# Patient Record
Sex: Male | Born: 1945
Health system: Southern US, Community
[De-identification: ages and names within clinical notes are randomized; demographics above are authoritative.]

## PROBLEM LIST (undated history)

## (undated) DIAGNOSIS — C3491 Malignant neoplasm of unspecified part of right bronchus or lung: Secondary | ICD-10-CM

## (undated) DIAGNOSIS — K922 Gastrointestinal hemorrhage, unspecified: Secondary | ICD-10-CM

## (undated) DIAGNOSIS — K219 Gastro-esophageal reflux disease without esophagitis: Secondary | ICD-10-CM

## (undated) DIAGNOSIS — I482 Chronic atrial fibrillation, unspecified: Secondary | ICD-10-CM

## (undated) DIAGNOSIS — I2510 Atherosclerotic heart disease of native coronary artery without angina pectoris: Secondary | ICD-10-CM

## (undated) DIAGNOSIS — I10 Essential (primary) hypertension: Secondary | ICD-10-CM

## (undated) DIAGNOSIS — E119 Type 2 diabetes mellitus without complications: Secondary | ICD-10-CM

## (undated) DIAGNOSIS — R0600 Dyspnea, unspecified: Secondary | ICD-10-CM

## (undated) DIAGNOSIS — I509 Heart failure, unspecified: Secondary | ICD-10-CM

## (undated) DIAGNOSIS — Z720 Tobacco use: Secondary | ICD-10-CM

## (undated) DIAGNOSIS — Z95810 Presence of automatic (implantable) cardiac defibrillator: Secondary | ICD-10-CM

## (undated) DIAGNOSIS — E785 Hyperlipidemia, unspecified: Secondary | ICD-10-CM

## (undated) DIAGNOSIS — Z7901 Long term (current) use of anticoagulants: Secondary | ICD-10-CM

## (undated) DIAGNOSIS — D649 Anemia, unspecified: Secondary | ICD-10-CM

## (undated) DIAGNOSIS — R590 Localized enlarged lymph nodes: Secondary | ICD-10-CM

## (undated) DIAGNOSIS — Z955 Presence of coronary angioplasty implant and graft: Secondary | ICD-10-CM

## (undated) DIAGNOSIS — I252 Old myocardial infarction: Secondary | ICD-10-CM

## (undated) DIAGNOSIS — I739 Peripheral vascular disease, unspecified: Secondary | ICD-10-CM

## (undated) HISTORY — PX: CORONARY STENT PLACEMENT: SHX1402

## (undated) HISTORY — DX: Atherosclerotic heart disease of native coronary artery without angina pectoris: I25.10

## (undated) HISTORY — DX: Type 2 diabetes mellitus without complications: E11.9

## (undated) HISTORY — DX: Tobacco use: Z72.0

## (undated) HISTORY — DX: Hyperlipidemia, unspecified: E78.5

## (undated) HISTORY — PX: CARDIAC SURGERY: SHX584

## (undated) HISTORY — DX: Peripheral vascular disease, unspecified: I73.9

## (undated) HISTORY — DX: Gastrointestinal hemorrhage, unspecified: K92.2

---

## 2001-02-13 ENCOUNTER — Encounter: Payer: Self-pay | Admitting: Family Medicine

## 2001-02-13 ENCOUNTER — Ambulatory Visit (HOSPITAL_COMMUNITY): Admission: RE | Admit: 2001-02-13 | Discharge: 2001-02-13 | Payer: Self-pay | Admitting: Family Medicine

## 2001-12-12 ENCOUNTER — Encounter: Payer: Self-pay | Admitting: Family Medicine

## 2001-12-12 ENCOUNTER — Ambulatory Visit (HOSPITAL_COMMUNITY): Admission: RE | Admit: 2001-12-12 | Discharge: 2001-12-12 | Payer: Self-pay | Admitting: Family Medicine

## 2008-02-27 DIAGNOSIS — I214 Non-ST elevation (NSTEMI) myocardial infarction: Secondary | ICD-10-CM

## 2008-02-27 HISTORY — DX: Non-ST elevation (NSTEMI) myocardial infarction: I21.4

## 2008-05-31 ENCOUNTER — Inpatient Hospital Stay (HOSPITAL_COMMUNITY): Admission: EM | Admit: 2008-05-31 | Discharge: 2008-06-03 | Payer: Self-pay | Admitting: Emergency Medicine

## 2008-05-31 IMAGING — CR DG CHEST 1V PORT
1 series · 1 of 1 positions shown · non-contrast
Comparison: Report dated [DATE].

CLINICAL DATA: Abnormal EKG.  Smoker.  Hypertension.

PORTABLE CHEST - 1 VIEW

[view not recorded]
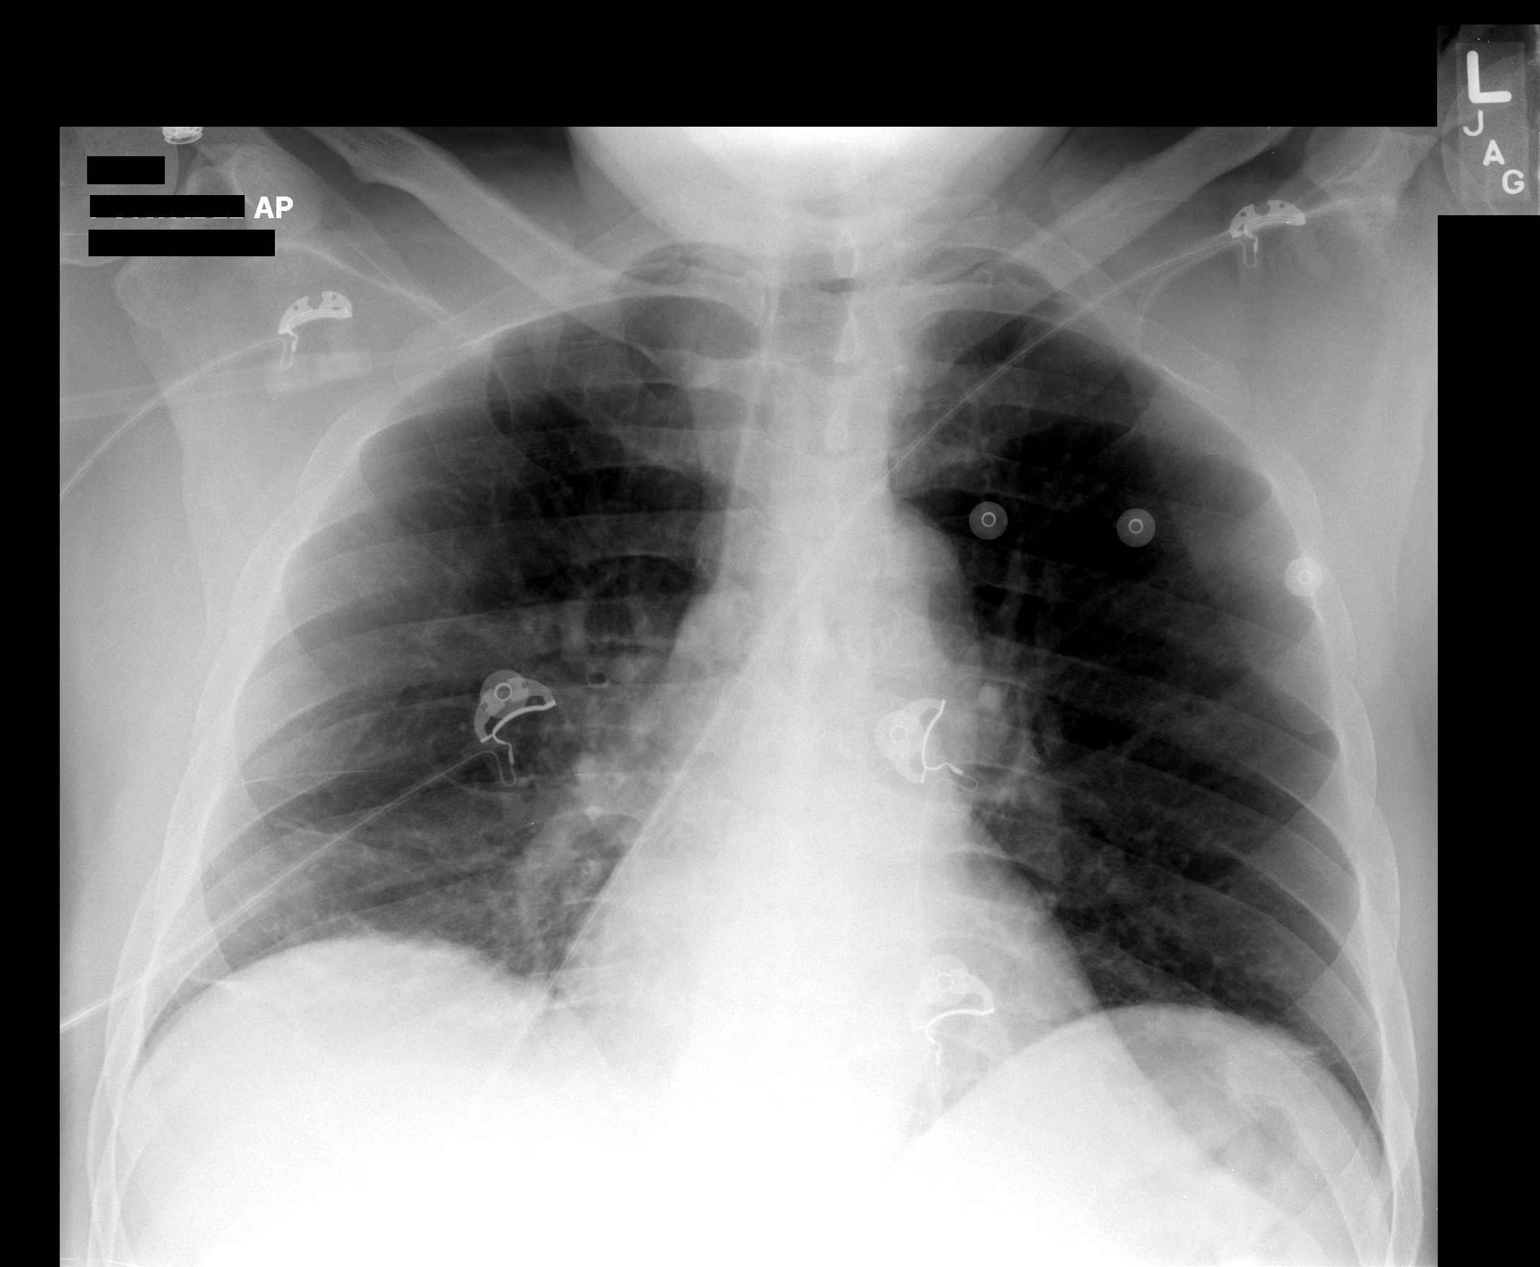

[1 of 1 positions shown; findings below may reference images not displayed]

FINDINGS: Normal sized heart.  Mild amount of atelectasis or
scarring at both lung bases.  Diffuse peribronchial thickening.
Normal vascularity.  Unremarkable bones.
IMPRESSION: 1.  Mild bibasilar atelectasis or scarring.
2.  Mild to moderate bronchitic changes.

## 2010-06-07 LAB — POCT I-STAT, CHEM 8
BUN: 5 mg/dL — ABNORMAL LOW (ref 6–23)
Calcium, Ion: 1.15 mmol/L (ref 1.12–1.32)
Chloride: 100 mEq/L (ref 96–112)
Creatinine, Ser: 1.1 mg/dL (ref 0.4–1.5)
Glucose, Bld: 123 mg/dL — ABNORMAL HIGH (ref 70–99)
HCT: 48 % (ref 39.0–52.0)
Hemoglobin: 16.3 g/dL (ref 13.0–17.0)
Potassium: 3.2 mEq/L — ABNORMAL LOW (ref 3.5–5.1)
Sodium: 141 mEq/L (ref 135–145)
TCO2: 32 mmol/L (ref 0–100)

## 2010-06-07 LAB — HEPATIC FUNCTION PANEL
ALT: 31 U/L (ref 0–53)
AST: 23 U/L (ref 0–37)
Albumin: 3.3 g/dL — ABNORMAL LOW (ref 3.5–5.2)
Alkaline Phosphatase: 58 U/L (ref 39–117)
Bilirubin, Direct: <0.1 mg/dL (ref 0.0–0.3)
Total Bilirubin: 0.5 mg/dL (ref 0.3–1.2)
Total Protein: 6.4 g/dL (ref 6.0–8.3)

## 2010-06-07 LAB — BASIC METABOLIC PANEL
BUN: 5 mg/dL — ABNORMAL LOW (ref 6–23)
BUN: 6 mg/dL (ref 6–23)
BUN: 7 mg/dL (ref 6–23)
BUN: 8 mg/dL (ref 6–23)
BUN: 9 mg/dL (ref 6–23)
CO2: 26 mEq/L (ref 19–32)
CO2: 27 mEq/L (ref 19–32)
CO2: 29 mEq/L (ref 19–32)
CO2: 30 mEq/L (ref 19–32)
CO2: 31 mEq/L (ref 19–32)
Calcium: 8.8 mg/dL (ref 8.4–10.5)
Calcium: 8.8 mg/dL (ref 8.4–10.5)
Calcium: 9 mg/dL (ref 8.4–10.5)
Calcium: 9.1 mg/dL (ref 8.4–10.5)
Calcium: 9.4 mg/dL (ref 8.4–10.5)
Chloride: 100 mEq/L (ref 96–112)
Chloride: 104 mEq/L (ref 96–112)
Chloride: 104 mEq/L (ref 96–112)
Chloride: 104 mEq/L (ref 96–112)
Chloride: 106 mEq/L (ref 96–112)
Creatinine, Ser: 0.92 mg/dL (ref 0.4–1.5)
Creatinine, Ser: 0.95 mg/dL (ref 0.4–1.5)
Creatinine, Ser: 0.96 mg/dL (ref 0.4–1.5)
Creatinine, Ser: 0.99 mg/dL (ref 0.4–1.5)
Creatinine, Ser: 1.07 mg/dL (ref 0.4–1.5)
GFR calc Af Amer: >60 mL/min (ref >60)
GFR calc Af Amer: >60 mL/min (ref >60)
GFR calc Af Amer: >60 mL/min (ref >60)
GFR calc Af Amer: >60 mL/min (ref >60)
GFR calc Af Amer: >60 mL/min (ref >60)
GFR calc non Af Amer: >60 mL/min (ref >60)
GFR calc non Af Amer: >60 mL/min (ref >60)
GFR calc non Af Amer: >60 mL/min (ref >60)
GFR calc non Af Amer: >60 mL/min (ref >60)
GFR calc non Af Amer: >60 mL/min (ref >60)
Glucose, Bld: 100 mg/dL — ABNORMAL HIGH (ref 70–99)
Glucose, Bld: 102 mg/dL — ABNORMAL HIGH (ref 70–99)
Glucose, Bld: 114 mg/dL — ABNORMAL HIGH (ref 70–99)
Glucose, Bld: 124 mg/dL — ABNORMAL HIGH (ref 70–99)
Glucose, Bld: 94 mg/dL (ref 70–99)
Potassium: 3 mEq/L — ABNORMAL LOW (ref 3.5–5.1)
Potassium: 3.2 mEq/L — ABNORMAL LOW (ref 3.5–5.1)
Potassium: 3.5 mEq/L (ref 3.5–5.1)
Potassium: 3.5 mEq/L (ref 3.5–5.1)
Potassium: 3.7 mEq/L (ref 3.5–5.1)
Sodium: 138 mEq/L (ref 135–145)
Sodium: 139 mEq/L (ref 135–145)
Sodium: 139 mEq/L (ref 135–145)
Sodium: 140 mEq/L (ref 135–145)
Sodium: 141 mEq/L (ref 135–145)

## 2010-06-07 LAB — CARDIAC PANEL(CRET KIN+CKTOT+MB+TROPI)
CK, MB: 1 ng/mL (ref 0.3–4.0)
CK, MB: 3.4 ng/mL (ref 0.3–4.0)
CK, MB: 4.2 ng/mL — ABNORMAL HIGH (ref 0.3–4.0)
CK, MB: 4.4 ng/mL — ABNORMAL HIGH (ref 0.3–4.0)
Relative Index: 2.4 (ref 0.0–2.5)
Relative Index: 2.7 — ABNORMAL HIGH (ref 0.0–2.5)
Relative Index: 3.2 — ABNORMAL HIGH (ref 0.0–2.5)
Relative Index: INVALID (ref 0.0–2.5)
Total CK: 128 U/L (ref 7–232)
Total CK: 131 U/L (ref 7–232)
Total CK: 184 U/L (ref 7–232)
Total CK: 53 U/L (ref 7–232)
Troponin I: 0.02 ng/mL (ref 0.00–0.06)
Troponin I: 0.63 ng/mL (ref 0.00–0.06)
Troponin I: 0.79 ng/mL (ref 0.00–0.06)
Troponin I: 0.87 ng/mL (ref 0.00–0.06)

## 2010-06-07 LAB — LIPID PANEL
Cholesterol: 208 mg/dL — ABNORMAL HIGH (ref 0–200)
HDL: 28 mg/dL — ABNORMAL LOW (ref >39)
LDL Cholesterol: 153 mg/dL — ABNORMAL HIGH (ref 0–99)
Total CHOL/HDL Ratio: 7.4 RATIO
Triglycerides: 134 mg/dL (ref <150)
VLDL: 27 mg/dL (ref 0–40)

## 2010-06-07 LAB — CBC
HCT: 39.8 % (ref 39.0–52.0)
HCT: 45.3 % (ref 39.0–52.0)
Hemoglobin: 13.5 g/dL (ref 13.0–17.0)
Hemoglobin: 15.5 g/dL (ref 13.0–17.0)
MCHC: 33.9 g/dL (ref 30.0–36.0)
MCHC: 34.2 g/dL (ref 30.0–36.0)
MCV: 90.8 fL (ref 78.0–100.0)
MCV: 90.9 fL (ref 78.0–100.0)
Platelets: 160 10*3/uL (ref 150–400)
Platelets: 190 10*3/uL (ref 150–400)
RBC: 4.39 MIL/uL (ref 4.22–5.81)
RBC: 4.98 MIL/uL (ref 4.22–5.81)
RDW: 13.5 % (ref 11.5–15.5)
RDW: 13.5 % (ref 11.5–15.5)
WBC: 5.8 10*3/uL (ref 4.0–10.5)
WBC: 7.6 10*3/uL (ref 4.0–10.5)

## 2010-06-07 LAB — PROTIME-INR
INR: 1.1 (ref 0.00–1.49)
Prothrombin Time: 14.8 seconds (ref 11.6–15.2)

## 2010-06-07 LAB — CK TOTAL AND CKMB (NOT AT ARMC)
CK, MB: 6.2 ng/mL — ABNORMAL HIGH (ref 0.3–4.0)
Relative Index: 2.9 — ABNORMAL HIGH (ref 0.0–2.5)
Total CK: 212 U/L (ref 7–232)

## 2010-06-07 LAB — HEPARIN LEVEL (UNFRACTIONATED): Heparin Unfractionated: 0.56 IU/mL (ref 0.30–0.70)

## 2010-06-07 LAB — TROPONIN I: Troponin I: 0.75 ng/mL (ref 0.00–0.06)

## 2010-07-11 NOTE — Cardiovascular Report (Signed)
NAME:  Peter Lambert NO.:  [id number]   MEDICAL RECORD NO.:  [id number]           PATIENT TYPE:   LOCATION:                                 FACILITY:   PHYSICIAN:  Quay Burow, M.D.   DATE OF BIRTH:  05/02/1945   DATE OF PROCEDURE:  DATE OF DISCHARGE:                            CARDIAC CATHETERIZATION   Peter Lambert is a 66 year old, mildly overweight, divorced African  American male with positive risk factors who had an episode of chest  pain, which he thought was some indigestion approximately 5 days ago.  He was seen in his family [facility] office on Monday and was  transported to [facility] because of EKG changes for further evaluation.  His  troponin came back positive at 0.87.  He was placed on IV heparin and  had no continue chest pain.   PROCEDURE DESCRIPTION:  The patient was brought to the Second Floor  [facility] in the postabsorptive state.  Premedicated  with p.o. Valium, IV Versed and fentanyl.  His right groin was prepped  and shaved in the usual sterile fashion.  A 1% Xylocaine was used for  local anesthesia.  A 6-French sheath was inserted into the right femoral  artery using standard Seldinger technique.  It should be noted that this  was inserted under direct visualization with a high stick because of  the patient's prior aortobifemoral bypass graft.  A 6-French right and  left Judkins diagnostic catheters, as well as with a 6-French pigtail  catheter, were used for selective coronary angiography, left  ventriculography, subselective left internal mammary artery angiography,  and distal abdominal aortography.  Visipaque dye was used for the  entirety of the case.  Retrograde aortic, left ventricular, and pullback  pressures were recorded.   HEMODYNAMICS:  1. Aortic systolic pressure [id number], diastolic pressure 74.  2. Left ventricular systolic pressure 0000000, end-diastolic pressure 17.   Selective coronary angiography:  1. Left  main normal.  2. LAD; LAD had a high-grade hypodense proximal bifurcation stenosis      involving the ostium of the first diagonal branch, which was medium      in size.  The LAD itself was 90% and the ostium of the diagonal was      approximately 80%.  3. Left circumflex; this was a dominant vessel with 80% stenosis in      the mid A-V groove circumflex just before takeoff of distal      posterolateral branch.  4. Right coronary; nondominant and occluded with bridging collaterals      in its midportion.  5. Left ventriculography; RAO left ventriculogram was performed using      25 mL of Visipaque dye at 12 mL per second.  The overall LVEF was      estimated at approximately 50-55% with mild global hypokinesia.  6. Left internal mammary artery; this vessel was subselectively      visualized and was widely patent.  It is suitable for using during      the coronary artery bypass grafting if necessary.  7. Distal abdominal aortography;  distal abdominal aortogram was      performed using 20 mL of Visipaque dye at 20 mL per second.  Renal      arteries were widely patent.  The infrarenal abdominal aorta      including the aortobifemoral bypass graft was widely patent.   IMPRESSION:  [name] has high-grade left anterior descending diagonal  branch disease, which probably represents his culprit vessel, also has  moderately high-grade mid atrioventricular groove circumflex and  dominant vessel.  We will proceed with left anterior descending and  diagonal branch intervention with potential staged intervention in the  future.   Existing 6-French sheath in the right femoral artery was exchanged over  wire for a 7-French sheath.  The patient had received 4 baby aspirin  this morning prior to the procedure, as well as 40 mEq of p.o. K-Dur for  a potassium of 3.2.  He received Plavix 600 mg p.o. and Pepcid 20 mg IV,  as well as Angiomax bolus with an ACT of 340.   Using a 7-French XB 3.5 guide  catheter along with two 0.014 x 190 [employer]  soft wires and a 2.0 x 10 along with a 2.5 x 15 angioscope atherectomy  catheter.  Both diagonal and LAD cutting balloon atherectomies were  performed using double wire technique.  A 2.0 was placed in the ostium  of the diagonal and 2.5 in the LAD proper.  A 200 mcg of intracoronary  nitroglycerin was then administered.  A 2.75 x 18 XIENCE V Abbott drug-  eluting stent was then carefully positioned across the diagonal branch  wire and encompassing the LAD disease, and this was deployed at 12  atmospheres.  The diagonal branch wire was then withdrawn, and  postdilatation was performed with a 3.0 x 15 [place] Voyager at 16  atmospheres (approximately 3.1 mL) resulting in reduction of 80% ostial  diagonal to less than 50% and a 90% proximal LAD to 0% residual without  dissection.  There were no hemodynamic and electrocardiographic  sequelae.  The guide wire catheters were removed.  The sheath was sewn  securely in place.  The patient left the lab in stable condition.  The  Angiomax was discontinued.  Plans will be to remove the sheath in  approximate 2 hours.  The patient will be gently hydrated, continue  aspirin and Plavix, most likely discharged in the next 48 hours.  He  left the lab in stable condition.      Quay Burow, M.D.  Electronically Signed     JB/MEDQ  D:  06/01/2008  T:  06/02/2008  Job:  [id number]   cc:   Second Floor [facility]  [facility]  Sherrilee Gilles. Gerarda Fraction, MD

## 2010-07-11 NOTE — H&P (Signed)
NAME:  Peter Lambert, DACRES NO.:  [id number]   MEDICAL RECORD NO.:  [id number]          PATIENT TYPE:  OBV   LOCATION:  3703                         FACILITY:  [facility]   PHYSICIAN:  Quay Burow, M.D.   DATE OF BIRTH:  11-30-1945   DATE OF ADMISSION:  05/31/2008  DATE OF DISCHARGE:                              HISTORY & PHYSICAL   [name] is a 65 year old mildly overweight divorced African American  male father of three children who works part-time at [employer].  He was  referred by Dr. Gerarda Fraction for evaluation of unstable angina.   [name] past medical history is remarkable for having undergone  peripheral vascular surgical procedure by Dr. Shanon Rosser back in 1997,  which sounds like an aorta left femoral bypass graft.  His  cardiovascular receptor profile is positive for continued tobacco abuse  of nearly pack a day for over 50 years and hypertension.  He is not  hyperlipidemic or diabetic.  He has no family history.  He has never had  heart attack or stroke.  He has had reflux in the past, treated with  Prevacid several years ago.  Last Thursday, he developed an episode of  substernal chest pain, which he thought was indigestion that lasted  approximately 10-15 minutes.  That discomfort has not recurred.  He saw  Dr. Gerarda Fraction today who after hearing the story and evaluating his EKG,  transported him to [facility] for further evaluation.   MEDICATIONS:  Hydrochlorothiazide and Adalat.  There is a home  medication.   ALLERGIES:  None.   SOCIAL HISTORY:  The patient is divorced and works part-time at the [employer].   PHYSICAL EXAMINATION:  VITAL SIGNS:  Blood pressure is 133/62 with pulse  of 89.  Sats are 99%.  The patient is alert, oriented.  NEUROLOGIC:  Intact.  LUNGS:  Clear.  NECK:  Carotids are 2+ without bruits.  HEART:  Regular rate and rhythm without murmurs, gallops, rubs, or  clicks.  ABDOMEN:  Soft, nontender.  Normoactive bowel sounds.  No  hepatosplenomegaly, organomegaly, audible bruits.  He does have midline  scar beginning at his epigastrium extending down below his umbilicus.  Femoral arteries at 2+ with a soft high-pitched right femoral bruit and  a louder lower pitched left femoral bruit.  He has diminished pedal  pulses without edema.   DICTATION ENDS HERE.     Quay Burow, M.D.  Electronically Signed    JB/MEDQ  D:  05/31/2008  T:  06/01/2008  Job:  [id number]   cc:   Sherrilee Gilles. Gerarda Fraction, MD

## 2010-07-11 NOTE — H&P (Signed)
NAME:  Peter Lambert, Peter Lambert NO.:  [id number]   MEDICAL RECORD NO.:  [id number]          PATIENT TYPE:  OBV   LOCATION:  3703                         FACILITY:  [facility]   PHYSICIAN:  Quay Burow, M.D.   DATE OF BIRTH:  1945-04-22   DATE OF ADMISSION:  05/31/2008  DATE OF DISCHARGE:                              HISTORY & PHYSICAL   ADDENDUM    A 12-lead EKG reveals sinus rhythm at 89 with inferior and anterolateral  T-wave inversion.   IMPRESSION:  Mr. Stifel has a positive risk factors, history of surgical  procedure in the past, and chest pain, which sounds like unstable angina  with EKG changes.  We will admit him to telemetry and put him on IV  heparin.  A portable x-ray was performed, and labs were drawn and still  pending.  Plans will be to perform diagnostic coronary arteriography on  him in the morning.  I explained the risks and benefits and the patient  agrees.      Quay Burow, M.D.  Electronically Signed     JB/MEDQ  D:  05/31/2008  T:  06/01/2008  Job:  [id number]

## 2010-07-14 NOTE — Discharge Summary (Signed)
NAME:  Peter Lambert, Peter Lambert NO.:  [id number]   MEDICAL RECORD NO.:  [id number]          PATIENT TYPE:  INP   LOCATION:  2033                         FACILITY:  [facility]   PHYSICIAN:  Quay Burow, M.D.   DATE OF BIRTH:  September 08, 1945   DATE OF ADMISSION:  05/31/2008  DATE OF DISCHARGE:  06/03/2008                               DISCHARGE SUMMARY   DISCHARGE DIAGNOSES:  1. Non-ST elevation myocardial infarction.  2. Coronary artery disease status post cardiac catheterization on      June 01, 2008, with left anterior descending stent, Xience 5, 2.75      x 18 placed and a percutaneous transluminal coronary angioplasty of      his diagonal vessel on June 01, 2008, by Dr. Quay Burow.      Residual disease of 80% in a circumflex at the bifurcation.  He had      a dominant circumflex and 100% in a mid right coronary artery.  3. Low normal ejection fraction of 50-55%.  4. History of peripheral vascular disease with an aorto-bi-iliac (I      believe) in the past by Dr. Scot Dock in 1997.  5. Hyperlipidemia.  6. Hypertension.   LABORATORY DATA:  Hemoglobin on admission 15.5, discharged 13.5,  hematocrit 39.8, WBC 7.6, and platelets 160.  Sodium 140, potassium 3.5,  chloride 106, CO2 of 26, BUN 9, creatinine 0.92, glucose 102.  AST was  23, ALT was 31.  CK-MB #1 is 212/6.2, troponin of 0.75; #2 is 184/4.4,  troponin of 0.87; #3 is 53/1.0, troponin of 0.02; #4 is 131/4.2,  troponin of 0.79; #5 is 128/3.4, troponin of 0.63.  Lipid profile total  cholesterol is 208, triglycerides 134, HDL 28, LDL was 153.   DISCHARGE INSTRUCTIONS:  He will go to Dr. Kennon Holter office for a Leane Call on June 10, 2008, at 12:45.  He will follow up with Dr. Gwenlyn Found  on June 17, 2008, at 9:30.  His chest x-ray showed some bronchitic  changes.   DISCHARGE MEDICATIONS:  1. Hydrochlorothiazide 25 mg a half every day.  2. Aspirin 325 mg every day.  3. Metoprolol 25 mg twice a day.  4. Plavix 75  mg every day.  5. K-Dur 20 mEq every day.  6. Lisinopril 10 mg every day.  7. Nitroglycerin 150 under tongue if needed every 5 minutes x3 for      chest pain.  8. Simvastatin 40 mg at bedtime.   HOSPITAL COURSE:  Peter Lambert is a 65 year old overweight African  American male patient of Dr. Gerarda Fraction, who was referred to Dr. Quay Burow because of chest pain.  He was seen in [place] and was sent to  [facility] for admission because of chest pain.  He had  inferior and anterolateral T-wave inversion.  He underwent cardiac  catheterization the following day on June 01, 2008, and he was found to  have multivessel disease.  He underwent intervention to a 90% LAD with a  Xience 5 stent 2.75 x 18.  He also had a diagonal lesion at the  bifurcation  of the stenosis which was reduced by PTCA from 80 to less  than 40%.  He had residual disease 80 and 60% in his circumflex and 100%  in his RCA, which was a small vessel.  He was circumflex dominant.  His  enzymes  did bump up mildly.  He was seen by cardiac rehab by the tobacco smoking  cessation nurse.  He was referred to cardiac rehab phase II.  The plan  was for him to have a stress test to see if he is having any ischemia  from his residual disease, and he will then follow up with Dr. Gwenlyn Found for  further recommendations.      Cyndia Bent, N.P.      Quay Burow, M.D.  Electronically Signed    BB/MEDQ  D:  06/30/2008  T:  07/01/2008  Job:  [id number]   cc:   Sherrilee Gilles. Gerarda Fraction, MD

## 2010-09-09 ENCOUNTER — Encounter: Payer: Self-pay | Admitting: Emergency Medicine

## 2010-09-09 ENCOUNTER — Other Ambulatory Visit: Payer: Self-pay

## 2010-09-09 ENCOUNTER — Emergency Department (HOSPITAL_COMMUNITY): Payer: Medicare Other

## 2010-09-09 ENCOUNTER — Emergency Department (HOSPITAL_COMMUNITY)
Admission: EM | Admit: 2010-09-09 | Discharge: 2010-09-09 | Disposition: A | Discharge status: Home / Self Care | Payer: Medicare Other | Attending: Emergency Medicine | Admitting: Emergency Medicine

## 2010-09-09 DIAGNOSIS — T5491XA Toxic effect of unspecified corrosive substance, accidental (unintentional), initial encounter: Secondary | ICD-10-CM | POA: Insufficient documentation

## 2010-09-09 DIAGNOSIS — T5991XA Toxic effect of unspecified gases, fumes and vapors, accidental (unintentional), initial encounter: Secondary | ICD-10-CM

## 2010-09-09 DIAGNOSIS — R61 Generalized hyperhidrosis: Secondary | ICD-10-CM

## 2010-09-09 DIAGNOSIS — F17200 Nicotine dependence, unspecified, uncomplicated: Secondary | ICD-10-CM | POA: Insufficient documentation

## 2010-09-09 HISTORY — DX: Old myocardial infarction: I25.2

## 2010-09-09 HISTORY — DX: Essential (primary) hypertension: I10

## 2010-09-09 LAB — COMPREHENSIVE METABOLIC PANEL
ALT: 18 U/L (ref 0–53)
AST: 20 U/L (ref 0–37)
Albumin: 4 g/dL (ref 3.5–5.2)
Alkaline Phosphatase: 65 U/L (ref 39–117)
BUN: 13 mg/dL (ref 6–23)
CO2: 31 mEq/L (ref 19–32)
Calcium: 9.5 mg/dL (ref 8.4–10.5)
Chloride: 98 mEq/L (ref 96–112)
Creatinine, Ser: 1.13 mg/dL (ref 0.50–1.35)
GFR calc Af Amer: >60 mL/min (ref >60)
GFR calc non Af Amer: >60 mL/min (ref >60)
Glucose, Bld: 98 mg/dL (ref 70–99)
Potassium: 3.3 mEq/L — ABNORMAL LOW (ref 3.5–5.1)
Sodium: 136 mEq/L (ref 135–145)
Total Bilirubin: 0.2 mg/dL — ABNORMAL LOW (ref 0.3–1.2)
Total Protein: 8.2 g/dL (ref 6.0–8.3)

## 2010-09-09 LAB — PROTIME-INR
INR: 1.14 (ref 0.00–1.49)
Prothrombin Time: 14.8 seconds (ref 11.6–15.2)

## 2010-09-09 LAB — DIFFERENTIAL
Basophils Absolute: 0.1 10*3/uL (ref 0.0–0.1)
Basophils Relative: 1 % (ref 0–1)
Eosinophils Absolute: 0.3 10*3/uL (ref 0.0–0.7)
Eosinophils Relative: 3 % (ref 0–5)
Lymphocytes Relative: 27 % (ref 12–46)
Lymphs Abs: 2.6 10*3/uL (ref 0.7–4.0)
Monocytes Absolute: 1.2 10*3/uL — ABNORMAL HIGH (ref 0.1–1.0)
Monocytes Relative: 12 % (ref 3–12)
Neutro Abs: 5.6 10*3/uL (ref 1.7–7.7)
Neutrophils Relative %: 57 % (ref 43–77)

## 2010-09-09 LAB — CBC
HCT: 42 % (ref 39.0–52.0)
Hemoglobin: 14.1 g/dL (ref 13.0–17.0)
MCH: 30 pg (ref 26.0–34.0)
MCHC: 33.6 g/dL (ref 30.0–36.0)
MCV: 89.4 fL (ref 78.0–100.0)
Platelets: 190 10*3/uL (ref 150–400)
RBC: 4.7 MIL/uL (ref 4.22–5.81)
RDW: 13.8 % (ref 11.5–15.5)
WBC: 9.8 10*3/uL (ref 4.0–10.5)

## 2010-09-09 LAB — CARDIAC PANEL(CRET KIN+CKTOT+MB+TROPI)
CK, MB: 5.2 ng/mL — ABNORMAL HIGH (ref 0.3–4.0)
CK, MB: 5.1 ng/mL — ABNORMAL HIGH (ref 0.3–4.0)
Relative Index: 2.1 (ref 0.0–2.5)
Relative Index: 2.4 (ref 0.0–2.5)
Total CK: 250 U/L — ABNORMAL HIGH (ref 7–232)
Total CK: 216 U/L (ref 7–232)
Troponin I: <0.3 ng/mL (ref <0.30)
Troponin I: <0.3 ng/mL (ref <0.30)

## 2010-09-09 LAB — APTT: aPTT: 28 seconds (ref 24–37)

## 2010-09-09 IMAGING — CR DG CHEST 2V
2 series · 2 of 2 positions shown · non-contrast
Comparison: [DATE]

CLINICAL DATA: diaphoresis, history of cardiac stents

CHEST - 2 VIEW

[view not recorded (1 of 2)]
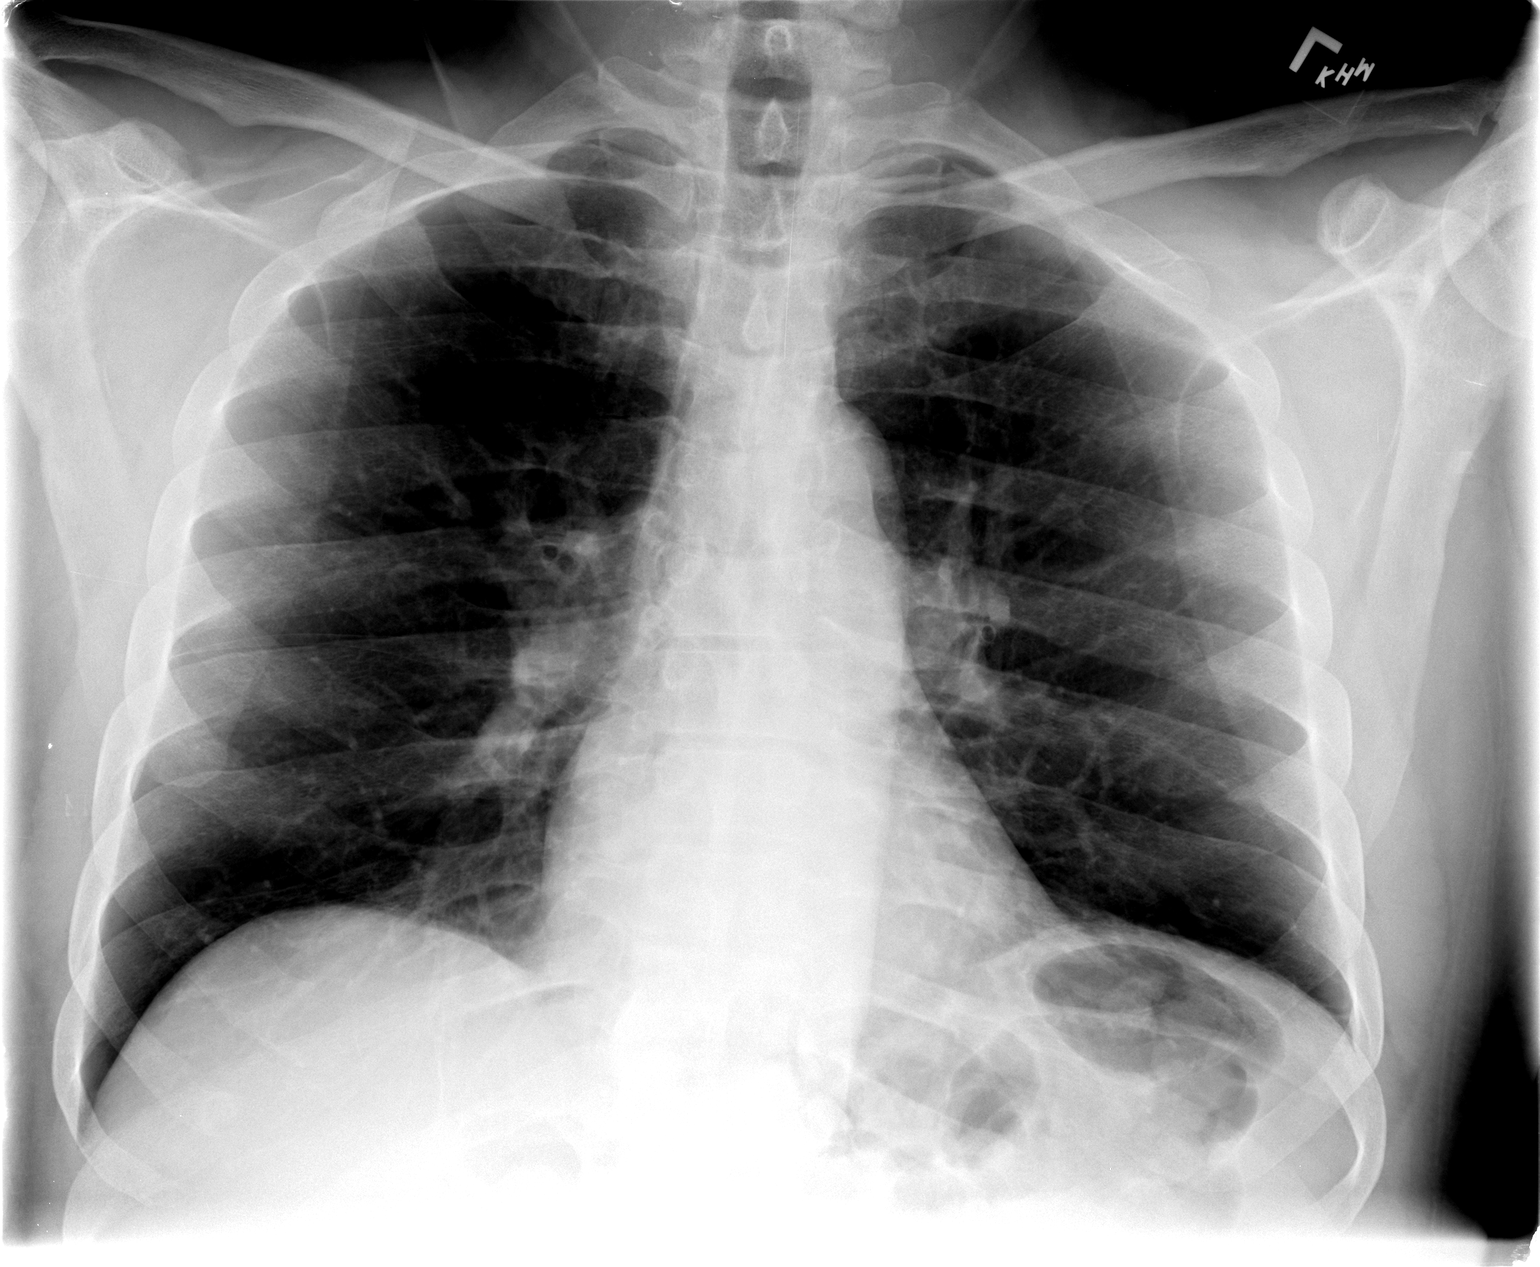

[view not recorded (2 of 2)]
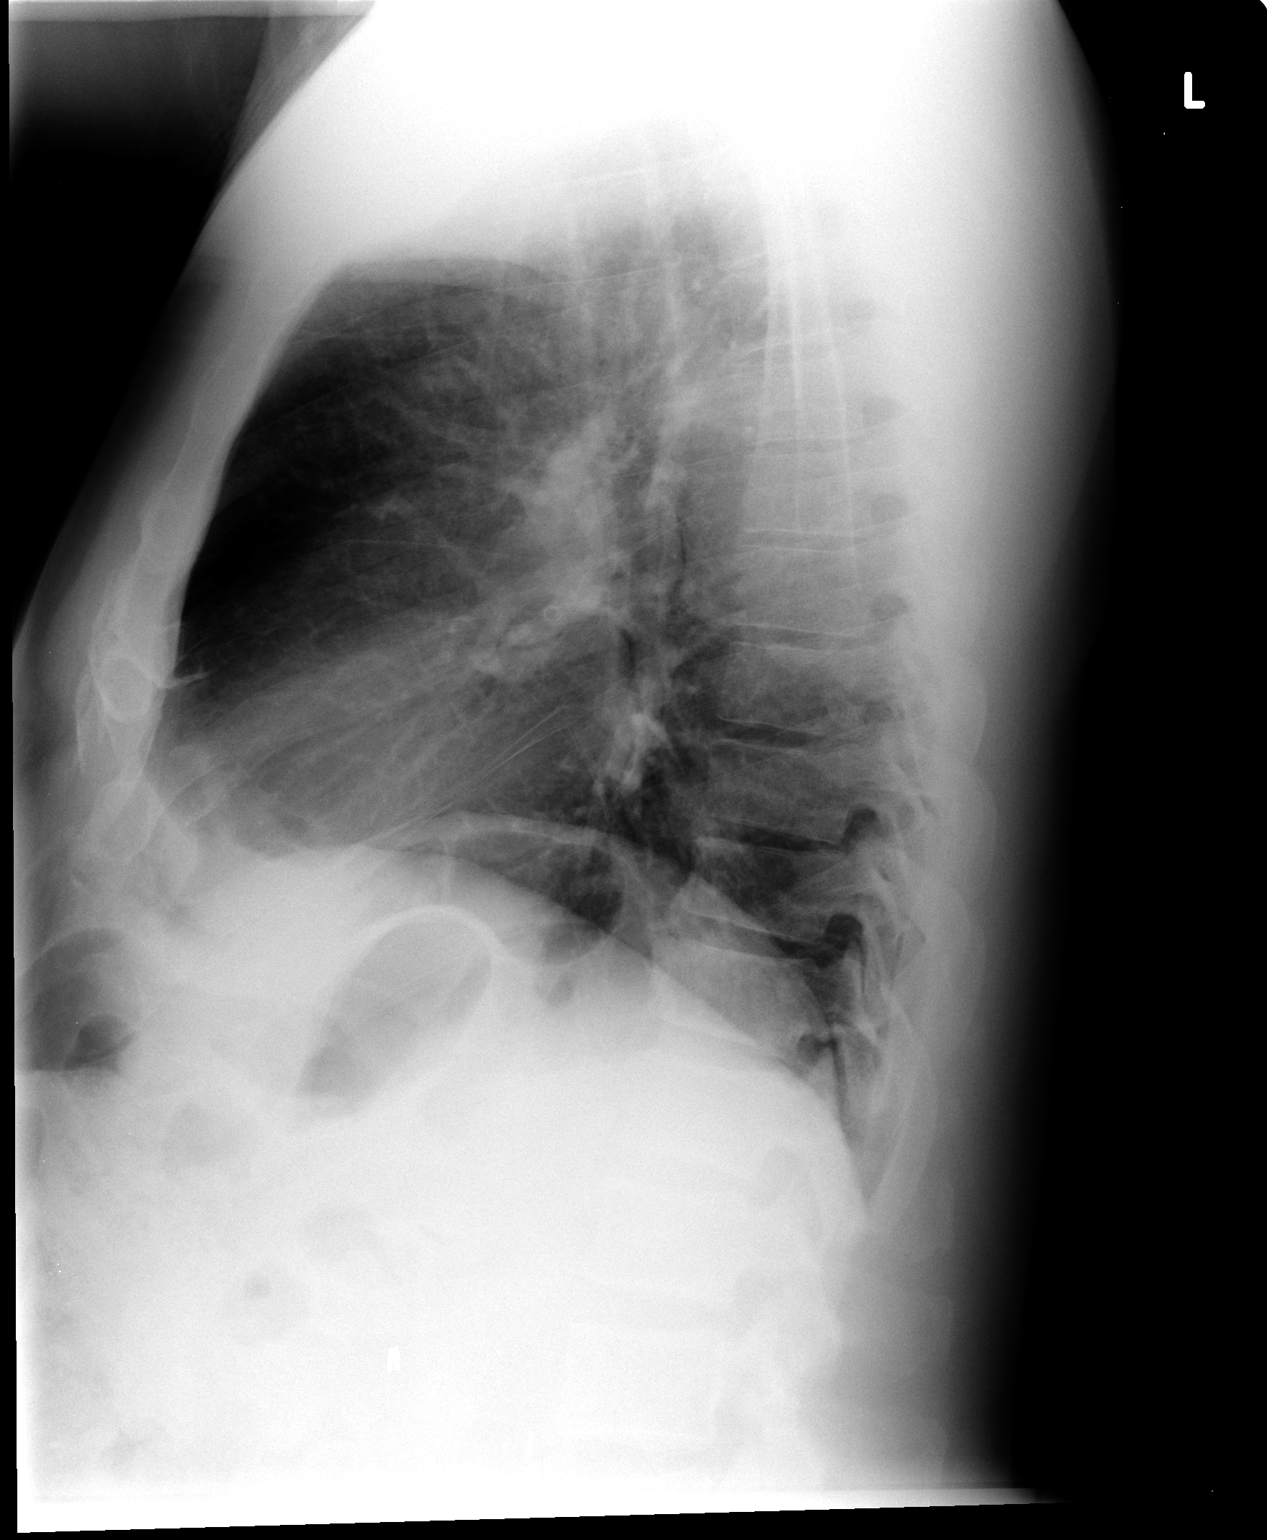

[2 of 2 positions shown; findings below may reference images not displayed]

FINDINGS: Heart size and vascular pattern are normal.  Lungs are
clear.  No change from prior study.
IMPRESSION: No acute abnormalities.

## 2010-09-09 MED ORDER — ASPIRIN 81 MG PO CHEW
324.0000 mg | CHEWABLE_TABLET | Freq: Once | ORAL | Status: AC
  Administered 2010-09-09: 324 mg via ORAL
  Filled 2010-09-09 (×2): qty 4

## 2010-09-09 MED ORDER — SODIUM CHLORIDE 0.9 % IV BOLUS (SEPSIS)
1000.0000 mL | Freq: Once | INTRAVENOUS | Status: AC
  Administered 2010-09-09: 1000 mL via INTRAVENOUS

## 2010-09-09 MED ORDER — SODIUM CHLORIDE 0.9 % IV SOLN
INTRAVENOUS | Status: DC
  Administered 2010-09-09: 08:00:00 via INTRAVENOUS

## 2010-09-09 NOTE — ED Provider Notes (Signed)
History     Chief Complaint  Patient presents with  . Toxic Inhalation   The history is provided by the patient.   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 States about 6 am after he was finished he broke out in a sweat for about 10-15 minutes. Denies chest pain , shortness of breath or wheezing. States he feels better now. Has never had this happen before. Pt had 2 cardiac stents, one placed in 1997 and 2010. Was last seen by Dr Gwenlyn Found 4 days ago for a routine check and was fine. States before with his cardiac problems he had heartburn type symptoms which he didn't have tonight.   Past Medical History  Diagnosis Date  . MI, old   . Hypertension     Past Surgical History  Procedure Date  . Coronary stent placement     History reviewed. No pertinent family history.  History  Substance Use Topics  . Smoking status: Current Everyday Smoker -- 0.5 packs/day    Types: Cigarettes  . Smokeless tobacco: Not on file  . Alcohol Use: No      Review of Systems  Constitutional: Negative.  Negative for fever, activity change and appetite change.  HENT: Negative.   Eyes: Negative.   Respiratory: Negative.  Negative for cough, choking and shortness of breath.   Cardiovascular: Negative.  Negative for chest pain and palpitations.  Neurological: Negative.   Hematological: Negative.   Psychiatric/Behavioral: Negative.   All other systems reviewed and are negative.    Physical Exam  BP 147/72  Pulse 69  Temp(Src) 98.3 F (36.8 C) (Oral)  Resp 16  Ht 5\' 8"  (1.727 m)  Wt 192 lb (87.091 kg)  BMI 29.19 kg/m2  SpO2 100%  Physical Exam  Constitutional: He is oriented to person, place, and time. He appears well-developed and well-nourished.  Non-toxic appearance. He does not appear ill. No distress.  HENT:  Head: Normocephalic and atraumatic.  Right Ear: External ear normal.  Left Ear: External ear  normal.  Nose: Nose normal. No mucosal edema or rhinorrhea.  Mouth/Throat: Oropharynx is clear and moist and mucous membranes are normal. No dental abscesses or uvula swelling.  Eyes: Conjunctivae and EOM are normal. Pupils are equal, round, and reactive to light.  Neck: Normal range of motion and full passive range of motion without pain. Neck supple.  Cardiovascular: Normal rate, regular rhythm and normal heart sounds.  Exam reveals no gallop and no friction rub.   No murmur heard. Pulmonary/Chest: Effort normal and breath sounds normal. No respiratory distress. He has no wheezes. He has no rhonchi. He has no rales. He exhibits no tenderness and no crepitus.  Abdominal: Soft. Normal appearance and bowel sounds are normal. He exhibits no distension. There is no tenderness. There is no rebound and no guarding.  Musculoskeletal: Normal range of motion. He exhibits no edema and no tenderness.       Moves all extremities well.   Neurological: He is alert and oriented to person, place, and time. He has normal strength. No cranial nerve deficit.  Skin: Skin is warm, dry and intact. No rash noted. No erythema. No pallor.  Psychiatric: He has a normal mood and affect. His speech is normal and behavior is normal. His mood appears not anxious.    ED Course  Procedures  MDM    Date: 09/09/2010  Rate: 61  Rhythm:  normal sinus rhythm  QRS Axis: normal  Intervals: normal  ST/T Wave abnormalities: nonspecific ST changes  Conduction Disutrbances:none  Narrative Interpretation: SLT of 06/01/2008 resolution of acute subendocardial MI in anterolateral leads  Old EKG Reviewed: changes noted   I called lab, Relative index was 2.1 which is normal.   Pt states he feels fine, ready to go home.   Janice Norrie, MD 09/09/10 [phone]

## 2010-09-09 NOTE — ED Notes (Signed)
Patient resting quietly in bed. Airway patent. Respirations even and nonlabored. No acute distress noted. No verbalized requests given. Family at bedside.

## 2010-09-09 NOTE — ED Notes (Signed)
Patient is resting comfortably. 

## 2010-09-09 NOTE — ED Notes (Signed)
Patient resting in bed talking to family. Airway patent. No acute distress noted. No verbalized requests given. IVF rate increased per EDP order.

## 2010-09-09 NOTE — ED Notes (Signed)
Patient resting in bed quietly. Airway patent. No acute distress noted. Patient ate lunch tray, tolerated well. No verbalized requests given at this time.

## 2010-09-09 NOTE — ED Notes (Signed)
Family at bedside. 

## 2010-09-09 NOTE — ED Notes (Signed)
Pt was cleaning with clorox became light headed had to sit down no residual effects after leaving there area

## 2012-07-27 ENCOUNTER — Other Ambulatory Visit: Payer: Self-pay | Admitting: Cardiovascular Disease

## 2012-07-28 NOTE — Telephone Encounter (Signed)
[facility] [place] still [facility] for [facility] his nitrogylcerin he is out of them-washed them in washing machine by mistake_please call asap!

## 2012-08-05 ENCOUNTER — Other Ambulatory Visit: Payer: Self-pay

## 2012-08-05 MED ORDER — METOPROLOL TARTRATE 25 MG PO TABS: 25.0000 mg | ORAL_TABLET | Freq: Two times a day (BID) | ORAL | Status: DC

## 2012-08-05 NOTE — Telephone Encounter (Signed)
Rx was sent to pharmacy electronically. 

## 2012-08-12 ENCOUNTER — Other Ambulatory Visit: Payer: Self-pay | Admitting: *Deleted

## 2012-08-12 MED ORDER — CLOPIDOGREL BISULFATE 75 MG PO TABS: 75.0000 mg | ORAL_TABLET | Freq: Every day | ORAL | Status: DC

## 2012-08-15 ENCOUNTER — Other Ambulatory Visit: Payer: Self-pay | Admitting: Pharmacist Clinician (PhC)/ Clinical Pharmacy Specialist

## 2012-08-28 ENCOUNTER — Other Ambulatory Visit: Payer: Self-pay | Admitting: Cardiovascular Disease

## 2012-08-28 NOTE — Telephone Encounter (Signed)
Rx was sent to pharmacy electronically. 

## 2012-09-12 ENCOUNTER — Telehealth: Payer: Self-pay | Admitting: Cardiovascular Disease

## 2012-09-12 MED ORDER — SIMVASTATIN 40 MG PO TABS: 40.0000 mg | ORAL_TABLET | Freq: Every day | ORAL | Status: DC

## 2012-09-12 NOTE — Telephone Encounter (Signed)
Returning call.

## 2012-09-12 NOTE — Telephone Encounter (Signed)
Returned call and pt informed refill sent.  Pt verbalized understanding and agreed w/ plan.

## 2012-09-12 NOTE — Telephone Encounter (Signed)
Returned call.  Message "System. Enter code."  Reviewed chart and will send in refill to [facility] [place].

## 2012-09-12 NOTE — Telephone Encounter (Signed)
Per Mr.Court [facility] is saying that [facility] cannot refill his medication (Simvastatin 40mg  ) because he needs to have blood work done .Marland Kitchen Please Call    Thanks

## 2012-09-23 ENCOUNTER — Other Ambulatory Visit: Payer: Self-pay | Admitting: Cardiovascular Disease

## 2012-09-23 NOTE — Telephone Encounter (Signed)
Rx was sent to pharmacy electronically. 

## 2012-10-28 ENCOUNTER — Other Ambulatory Visit: Payer: Self-pay | Admitting: Cardiovascular Disease

## 2012-10-28 ENCOUNTER — Other Ambulatory Visit: Payer: Self-pay | Admitting: *Deleted

## 2012-10-28 MED ORDER — LISINOPRIL 20 MG PO TABS: 20.0000 mg | ORAL_TABLET | Freq: Two times a day (BID) | ORAL | Status: DC

## 2012-10-28 NOTE — Telephone Encounter (Signed)
Rx was sent to pharmacy electronically. 

## 2012-11-20 ENCOUNTER — Other Ambulatory Visit: Payer: Self-pay | Admitting: Cardiovascular Disease

## 2012-12-03 ENCOUNTER — Ambulatory Visit (INDEPENDENT_AMBULATORY_CARE_PROVIDER_SITE_OTHER): Payer: Medicare Other | Admitting: Cardiovascular Disease

## 2012-12-03 ENCOUNTER — Encounter: Payer: Self-pay | Admitting: Cardiovascular Disease

## 2012-12-03 VITALS — BP 150/84 | HR 81 | Ht 68.0 in | Wt 179.8 lb

## 2012-12-03 DIAGNOSIS — I10 Essential (primary) hypertension: Secondary | ICD-10-CM | POA: Insufficient documentation

## 2012-12-03 DIAGNOSIS — I779 Disorder of arteries and arterioles, unspecified: Secondary | ICD-10-CM

## 2012-12-03 DIAGNOSIS — E119 Type 2 diabetes mellitus without complications: Secondary | ICD-10-CM | POA: Insufficient documentation

## 2012-12-03 DIAGNOSIS — I739 Peripheral vascular disease, unspecified: Secondary | ICD-10-CM | POA: Insufficient documentation

## 2012-12-03 DIAGNOSIS — E785 Hyperlipidemia, unspecified: Secondary | ICD-10-CM

## 2012-12-03 DIAGNOSIS — I2510 Atherosclerotic heart disease of native coronary artery without angina pectoris: Secondary | ICD-10-CM | POA: Insufficient documentation

## 2012-12-03 NOTE — Assessment & Plan Note (Signed)
Controlled on current medications 

## 2012-12-03 NOTE — Assessment & Plan Note (Signed)
Status post non-ST segment elevation myocardial infarction April 2010. Cardiac catheterization revealed a high-grade proximal LAD stenosis which was stented with a 2.75 mm x 18 mm long drug-eluting stent. Dr. Ronnald Ramp first diagonal branch and performed angioplasty on the os the emergency and 80% stenosis to less than 40%. He had a dominant left circumflex system with an 80% distal AV groove circumflex, and nondominant RCA with normal LV function. He denies chest pain. Myoview performed in 2010 showed diaphragmatic attenuation versus anterior scar

## 2012-12-03 NOTE — Patient Instructions (Addendum)
Your physician recommends that you schedule a follow-up appointment in: 6 months with PA  Your physician recommends that you schedule a follow-up appointment in: 1 year with Dr Gwenlyn Found  Your physician has requested that you have a carotid duplex. This test is an ultrasound of the carotid arteries in your neck. It looks at blood flow through these arteries that supply the brain with blood. Allow one hour for this exam. There are no restrictions or special instructions.

## 2012-12-03 NOTE — Assessment & Plan Note (Signed)
On statin drug and Zetia. His left labwork performed 05/10/12 revealed a total cholesterol 119, HDL of 32, and LDL of 65.

## 2012-12-03 NOTE — Progress Notes (Signed)
12/03/2012 Peter Lambert   Jan 16, 1946  [id number]  Primary Physician Leonides Grills, MD Primary Cardiologist: Lorretta Harp MD Renae Gloss   HPI:  The patient is a very pleasant 67 year old mildly overweight divorced African American male, father of 64, grandfather to 4 grandchildren, who I last saw in the office 6 months ago. He had a non-ST-segment-elevation myocardial infarction in April 2010. I catheterized him revealing high-grade proximal LAD stenosis, which I stented using a 2.75 x 18 mm long Xience V drug-eluting stent. I did jail the first diagonal branch, performed angioplasty at the ostium, reducing an 80% stenosis to less than 40%. He had a dominant circumflex system with an 80% distal AV groove circumflex as well as a nondominant RCA. He had normal LV function. Abdominal aortogram revealed a patent aortobifemoral bypass graft, which was placed approximately 15 years ago. He has known occluded SFAs bilaterally with ABIs that run in the 0.5 or 0.6 range. He denies chest pain, shortness of breath, or claudication. He continues to smoke 3 packs of cigarettes per week despite counseling to the contrary. I spent greater than 3 minutes discussing smoking cessation with him today. Dr. Orson Ape follows his lipid profile.since I saw him in the office/7/14 he denies chest pain or shortness of breath.    Current Outpatient Prescriptions  Medication Sig Dispense Refill  . aspirin 81 MG chewable tablet Chew 81 mg by mouth daily.        . clopidogrel (PLAVIX) 75 MG tablet Take 1 tablet (75 mg total) by mouth daily.  30 tablet  6  . hydrochlorothiazide (HYDRODIURIL) 25 MG tablet Take 25 mg by mouth daily.      Marland Kitchen lisinopril (PRINIVIL,ZESTRIL) 20 MG tablet Take 1 tablet (20 mg total) by mouth 2 (two) times daily.  60 tablet  6  . metFORMIN (GLUCOPHAGE) 500 MG tablet Take 500 mg by mouth 2 (two) times daily with a meal.      . metoprolol tartrate (LOPRESSOR) 25 MG tablet Take 1  tablet (25 mg total) by mouth 2 (two) times daily.  60 tablet  10  . potassium chloride (KLOR-CON) 20 MEQ packet Take 20 mEq by mouth daily. Please call for appointment.  30 tablet  0  . simvastatin (ZOCOR) 40 MG tablet Take 1 tablet (40 mg total) by mouth at bedtime.  30 tablet  9  . ZETIA 10 MG tablet TAKE 1 TABLET EVERY DAY  30 tablet  6  . NITROSTAT 0.4 MG SL tablet TAKE 1 TABLET UNDER THE TONGUE EVERY 5MINS. X3 AS NEEDED FOR CHEST PAIN  25 tablet  2   No current facility-administered medications for this visit.    No Known Allergies  History   Social History  . Marital Status: Divorced    Spouse Name: N/A    Number of Children: N/A  . Years of Education: N/A   Occupational History  . Not on file.   Social History Main Topics  . Smoking status: Current Every Day Smoker -- 0.25 packs/day for 52 years    Types: Cigarettes  . Smokeless tobacco: Never Used  . Alcohol Use: No  . Drug Use: No  . Sexual Activity: No   Other Topics Concern  . Not on file   Social History Narrative  . No narrative on file     Review of Systems: General: negative for chills, fever, night sweats or weight changes.  Cardiovascular: negative for chest pain, dyspnea on exertion, edema, orthopnea, palpitations,  paroxysmal nocturnal dyspnea or shortness of breath Dermatological: negative for rash Respiratory: negative for cough or wheezing Urologic: negative for hematuria Abdominal: negative for nausea, vomiting, diarrhea, bright red blood per rectum, melena, or hematemesis Neurologic: negative for visual changes, syncope, or dizziness All other systems reviewed and are otherwise negative except as noted above.    Blood pressure 150/84, pulse 81, height 5\' 8"  (1.727 m), weight 179 lb 12.8 oz (81.557 kg).  General appearance: alert and no distress Neck: no adenopathy, no JVD, supple, symmetrical, trachea midline, thyroid not enlarged, symmetric, no tenderness/mass/nodules and soft left carotid  bruit Lungs: clear to auscultation bilaterally Heart: regular rate and rhythm, S1, S2 normal, no murmur, click, rub or gallop Extremities: extremities normal, atraumatic, no cyanosis or edema  EKG normal sinus rhythm at 81 with nonspecific ST and T-wave changes and evidence of left ventricular hypertrophy. There were occasional PVCs noted.  ASSESSMENT AND PLAN:   Peripheral arterial disease Status post remote aortobifemoral bypass grafting. His left lower extreme E. Doppler studies performed our office 09/28/10 revealed occluded SFAs bilaterally, didn't aortobifemoral bypass graft with ABIs in the 0.5-0.6 range bilaterally. He really denies claudication  Coronary artery disease Status post non-ST segment elevation myocardial infarction April 2010. Cardiac catheterization revealed a high-grade proximal LAD stenosis which was stented with a 2.75 mm x 18 mm long drug-eluting stent. Dr. Ronnald Ramp first diagonal branch and performed angioplasty on the os the emergency and 80% stenosis to less than 40%. He had a dominant left circumflex system with an 80% distal AV groove circumflex, and nondominant RCA with normal LV function. He denies chest pain. Myoview performed in 2010 showed diaphragmatic attenuation versus anterior scar  Essential hypertension Controlled on current medications  Hyperlipidemia On statin drug and Zetia. His left labwork performed 05/10/12 revealed a total cholesterol 119, HDL of 32, and LDL of 65.      Lorretta Harp MD FACP,FACC,FAHA, [facility] 12/03/2012 2:51 PM

## 2012-12-03 NOTE — Assessment & Plan Note (Signed)
Status post remote aortobifemoral bypass grafting. His left lower extreme E. Doppler studies performed our office 09/28/10 revealed occluded SFAs bilaterally, didn't aortobifemoral bypass graft with ABIs in the 0.5-0.6 range bilaterally. He really denies claudication

## 2012-12-04 ENCOUNTER — Encounter: Payer: Self-pay | Admitting: Cardiovascular Disease

## 2012-12-24 ENCOUNTER — Other Ambulatory Visit: Payer: Self-pay | Admitting: Cardiovascular Disease

## 2012-12-24 NOTE — Telephone Encounter (Signed)
Rx was sent to pharmacy electronically. 

## 2012-12-29 ENCOUNTER — Ambulatory Visit (HOSPITAL_COMMUNITY)
Admission: RE | Admit: 2012-12-29 | Discharge: 2012-12-29 | Disposition: A | Discharge status: Home / Self Care | Payer: Medicare Other | Source: Ambulatory Visit | Attending: Cardiovascular Disease | Admitting: Cardiovascular Disease

## 2012-12-29 DIAGNOSIS — I6529 Occlusion and stenosis of unspecified carotid artery: Secondary | ICD-10-CM

## 2012-12-29 DIAGNOSIS — I779 Disorder of arteries and arterioles, unspecified: Secondary | ICD-10-CM

## 2012-12-29 DIAGNOSIS — R0989 Other specified symptoms and signs involving the circulatory and respiratory systems: Secondary | ICD-10-CM

## 2012-12-29 NOTE — Progress Notes (Signed)
Carotid Duplex Completed. Sung Parodi, BS, RDMS, RVT  

## 2013-01-06 ENCOUNTER — Telehealth: Payer: Self-pay | Admitting: Cardiovascular Disease

## 2013-01-06 ENCOUNTER — Telehealth: Payer: Self-pay | Admitting: *Deleted

## 2013-01-06 DIAGNOSIS — I6529 Occlusion and stenosis of unspecified carotid artery: Secondary | ICD-10-CM

## 2013-01-06 NOTE — Telephone Encounter (Signed)
Message forwarded to K. Vogel, RN to discuss w/ Dr. Berry.   

## 2013-01-06 NOTE — Telephone Encounter (Signed)
I spoke with Claiborne Billings D, okay to use nicotine patch. Patient notified

## 2013-01-06 NOTE — Telephone Encounter (Signed)
Message copied by Chauncy Lean on Tue Jan 06, 2013 11:33 AM ------      Message from: Lorretta Harp      Created: Thu Jan 01, 2013  1:27 PM       Mild to moderate bilateral ICA stenosis. Repeat in 12 months ------

## 2013-01-06 NOTE — Telephone Encounter (Signed)
Wants to know if ok to use nicotine patches.  Please call.

## 2013-01-06 NOTE — Telephone Encounter (Signed)
Order placed for repeat carotid doppler in 1 year 

## 2013-01-09 ENCOUNTER — Ambulatory Visit: Payer: Medicare Other | Admitting: Urology

## 2013-02-27 ENCOUNTER — Ambulatory Visit (INDEPENDENT_AMBULATORY_CARE_PROVIDER_SITE_OTHER): Payer: Medicare PPO | Admitting: Urology

## 2013-02-27 ENCOUNTER — Encounter (INDEPENDENT_AMBULATORY_CARE_PROVIDER_SITE_OTHER): Payer: Self-pay

## 2013-02-27 ENCOUNTER — Other Ambulatory Visit: Payer: Self-pay | Admitting: Urology

## 2013-02-27 DIAGNOSIS — R972 Elevated prostate specific antigen [PSA]: Secondary | ICD-10-CM

## 2013-03-04 ENCOUNTER — Telehealth: Payer: Self-pay | Admitting: Cardiovascular Disease

## 2013-03-04 NOTE — Telephone Encounter (Signed)
Patient walked in today and stated that he is going to have a prostate ultrasound and biopsy and was told by [facility], Dr Irine Seal, that he will need to stop his Plavix and aspirin seven days prior to his February 20th , 2015 appointment. Please advise and return patient's call.

## 2013-03-04 NOTE — Telephone Encounter (Signed)
Forward to [facility] /Dr Gwenlyn Found Decision about plavix

## 2013-03-06 NOTE — Telephone Encounter (Signed)
Please advise 

## 2013-03-06 NOTE — Telephone Encounter (Signed)
Forward to [place].

## 2013-03-06 NOTE — Telephone Encounter (Signed)
OK to hold ASA & plavix for his prostate Bx and re start afterward

## 2013-03-09 ENCOUNTER — Encounter: Payer: Self-pay | Admitting: *Deleted

## 2013-03-10 NOTE — Telephone Encounter (Signed)
Letter drafted and sent to Dr Jeffie Pollock clearing patient for biopsy

## 2013-03-12 ENCOUNTER — Other Ambulatory Visit: Payer: Self-pay | Admitting: *Deleted

## 2013-03-12 MED ORDER — CLOPIDOGREL BISULFATE 75 MG PO TABS: 75.0000 mg | ORAL_TABLET | Freq: Every day | ORAL | Status: DC

## 2013-03-13 ENCOUNTER — Other Ambulatory Visit: Payer: Self-pay | Admitting: *Deleted

## 2013-03-17 MED ORDER — CLOPIDOGREL BISULFATE 75 MG PO TABS: 75.0000 mg | ORAL_TABLET | Freq: Every day | ORAL | Status: DC

## 2013-03-20 ENCOUNTER — Telehealth: Payer: Self-pay | Admitting: Cardiovascular Disease

## 2013-03-20 NOTE — Telephone Encounter (Signed)
Pt called and said he waiting to hear if he was cleared for his surgery that is scheduled 04-17-13.Please call and let him know.

## 2013-03-20 NOTE — Telephone Encounter (Signed)
Returned call and pt verified x 2.  Pt informed message received and letter was sent to Dr. Ralene Muskrat office on 1.13.15 per note by Curt Bears, RN for Dr. Gwenlyn Found.  Pt stated he hasn't heard anything from our office or theirs.  Pt informed he has been cleared and can hold ASA and Plavix for procedure per letter.  Pt advised to contact Dr. Ralene Muskrat office for more details.  Pt verbalized understanding and agreed w/ plan.

## 2013-04-10 ENCOUNTER — Other Ambulatory Visit (HOSPITAL_COMMUNITY): Payer: Medicare Other

## 2013-04-17 ENCOUNTER — Ambulatory Visit (HOSPITAL_COMMUNITY)
Admission: RE | Admit: 2013-04-17 | Discharge: 2013-04-17 | Disposition: A | Discharge status: Home / Self Care | Payer: Medicare PPO | Source: Ambulatory Visit | Attending: Urology | Admitting: Urology

## 2013-04-17 DIAGNOSIS — R972 Elevated prostate specific antigen [PSA]: Secondary | ICD-10-CM | POA: Insufficient documentation

## 2013-04-17 IMAGING — US US TRANSRECTAL
1 series · 14 of 16 positions shown · non-contrast
Comparison: none

[Series 1: us transrectal · 0.11mm/px · 23 acquisitions, 14 frames shown]
[im 1/23]
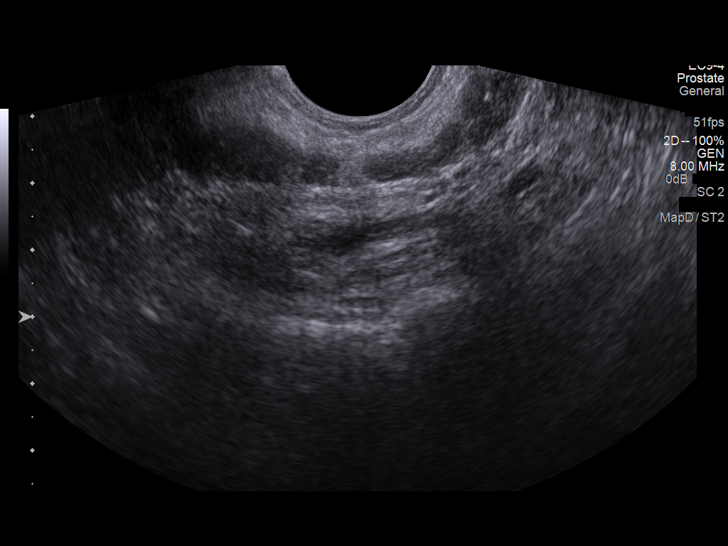
[im 2/23]
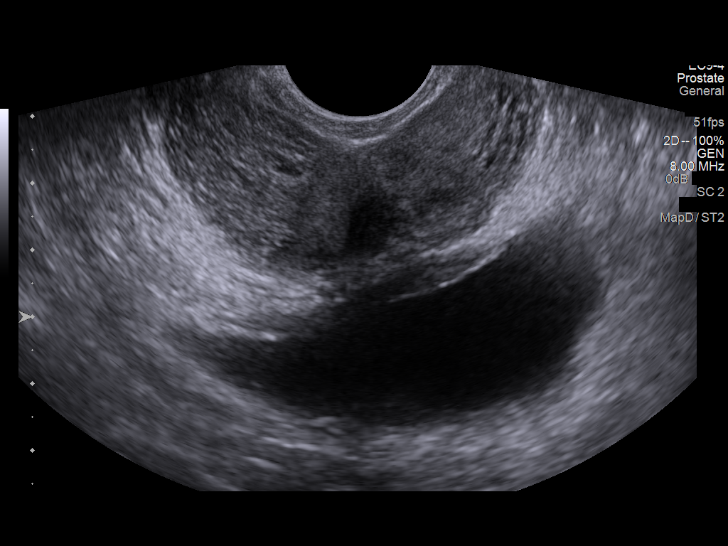
[im 3/23]
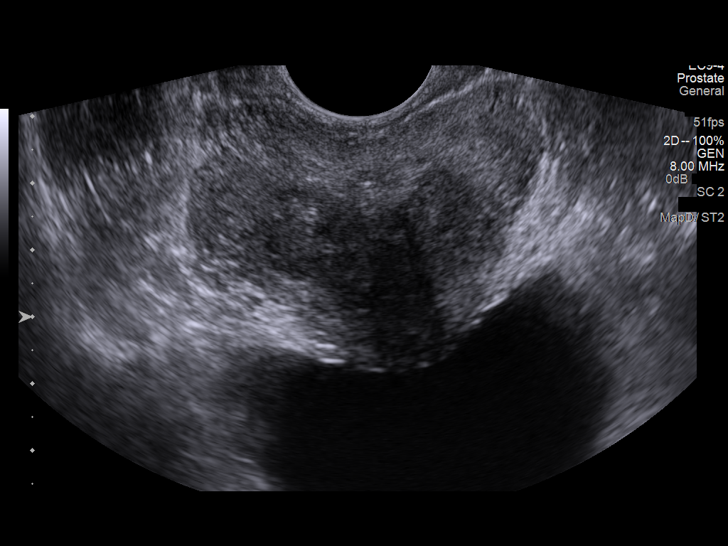
[im 6/23]
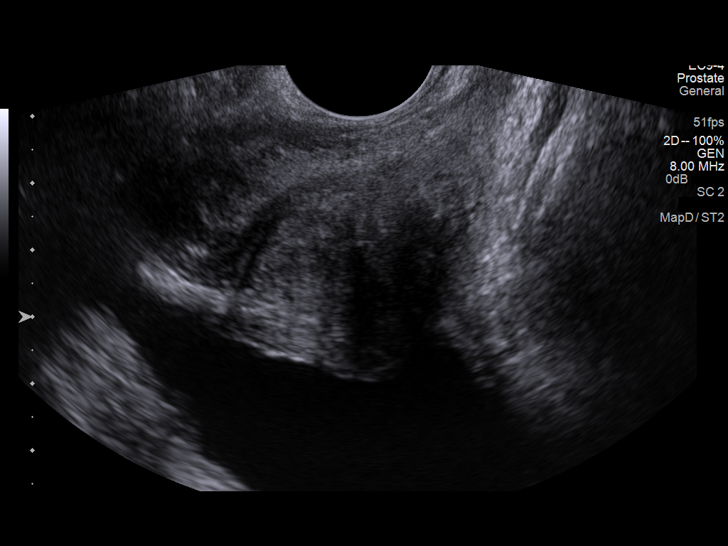
[im 8/23]
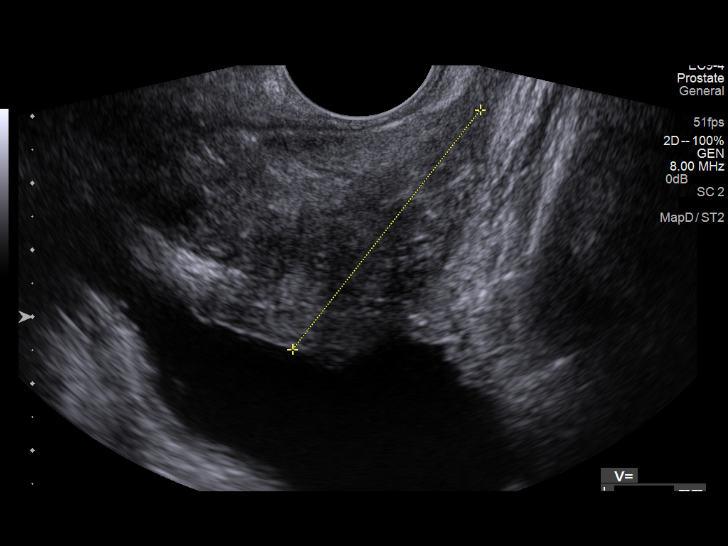
[im 9/23]
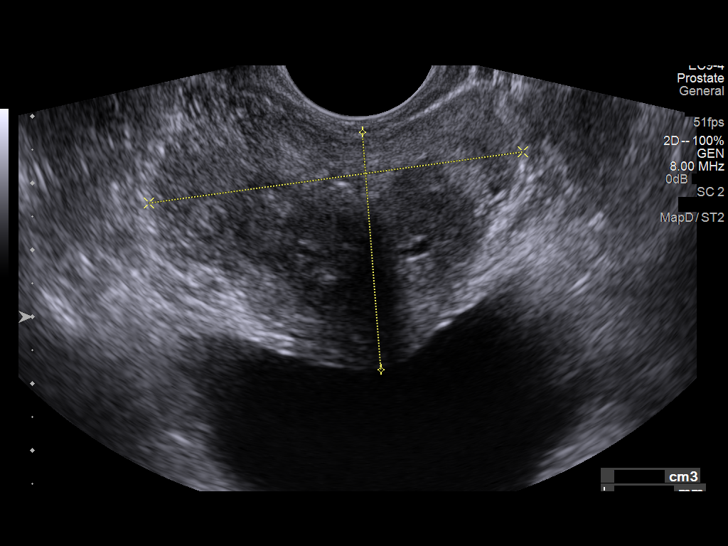
[im 11/23]
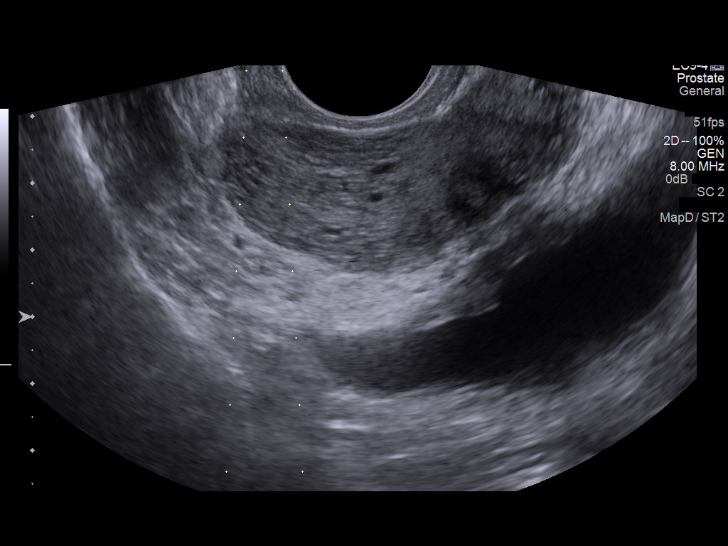
[im 12/23]
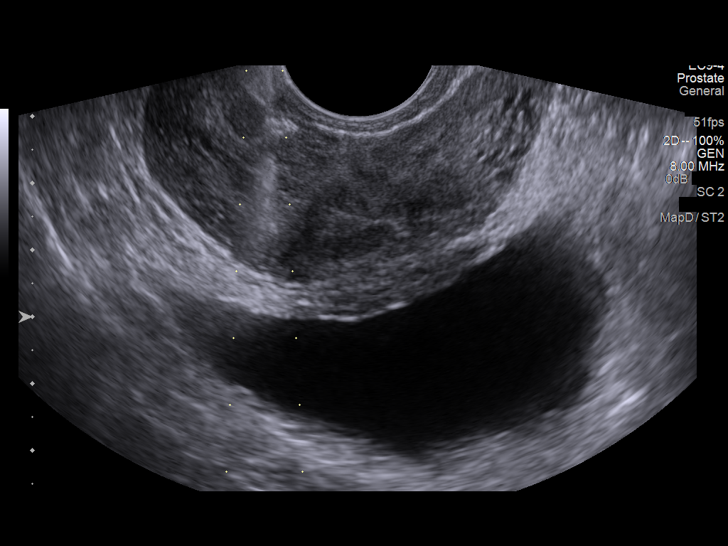
[im 14/23]
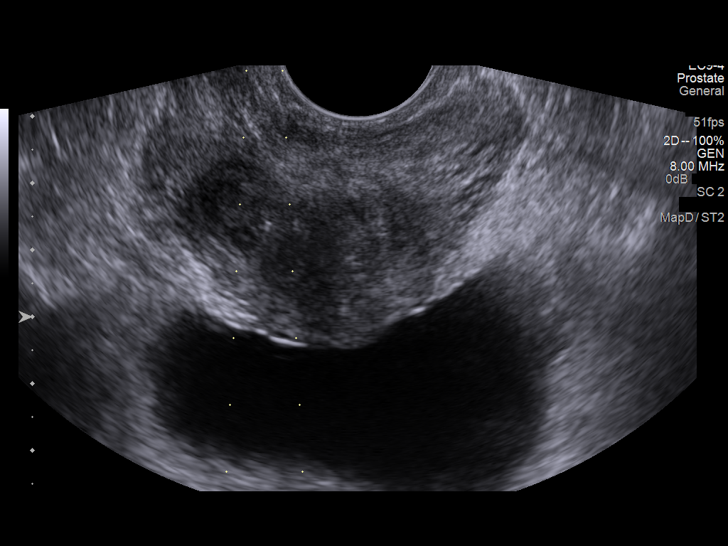
[im 15/23]
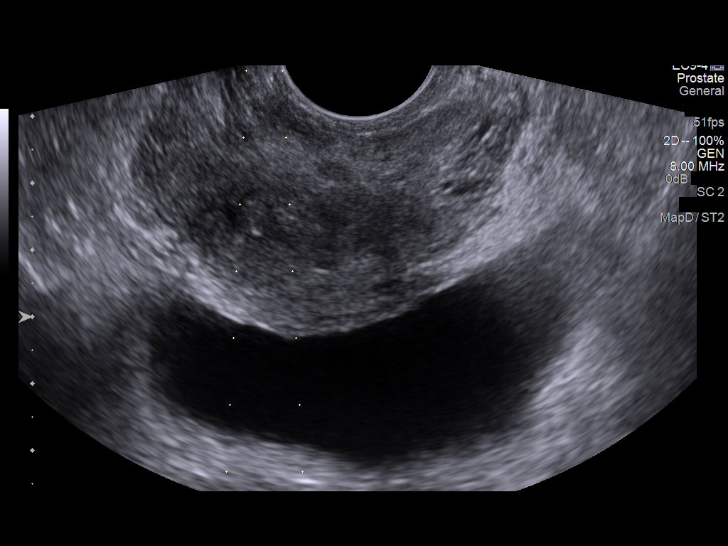
[im 18/23]
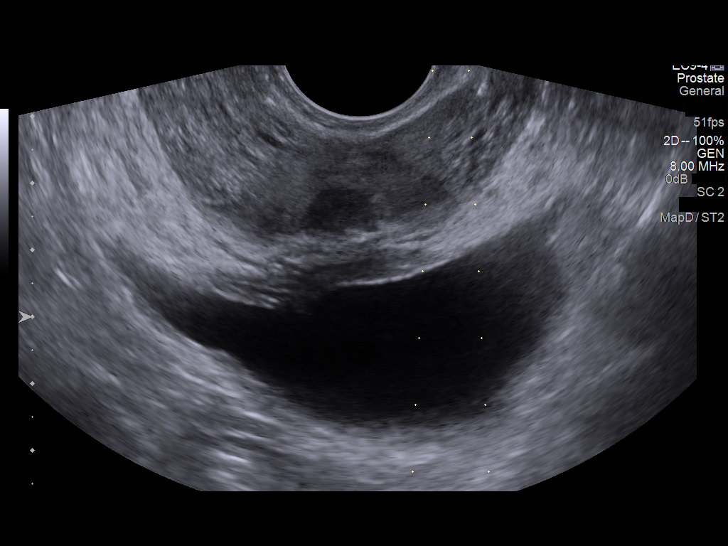
[im 20/23]
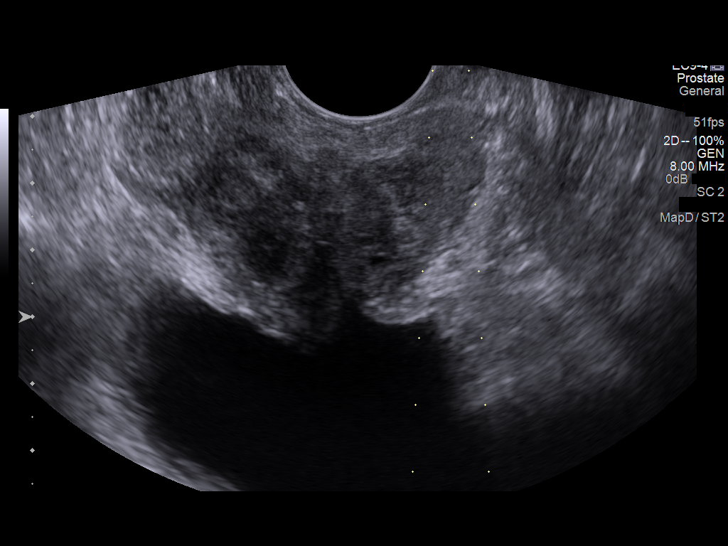
[im 21/23]
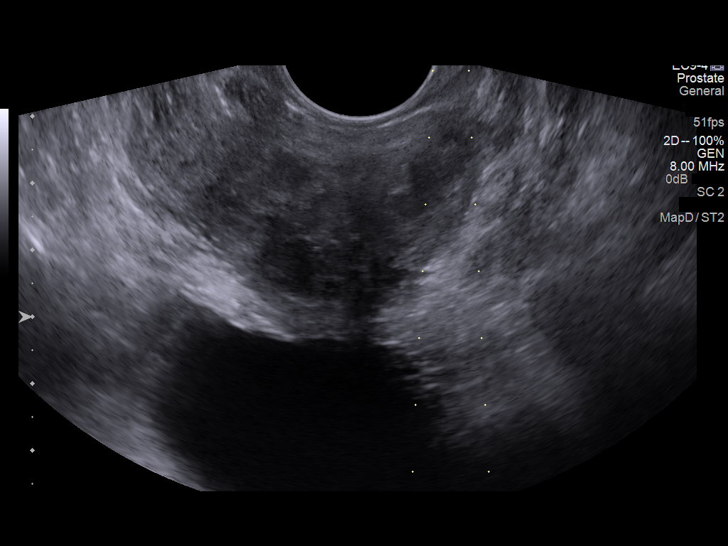
[im 23/23]
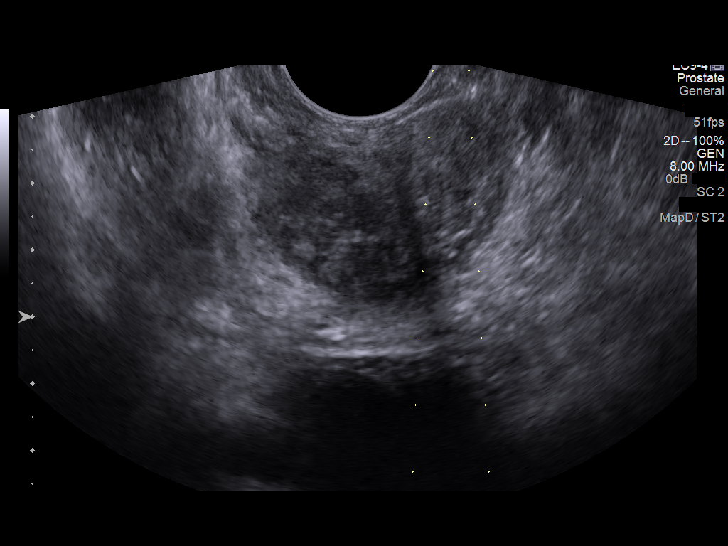

[14 of 16 positions shown; findings below may reference images not displayed]

Canned report from images found in remote index.

Refer to host system for actual result text.

## 2013-04-17 MED ORDER — LIDOCAINE HCL (PF) 2 % IJ SOLN
INTRAMUSCULAR | Status: AC
  Filled 2013-04-17: qty 10

## 2013-04-17 MED ORDER — LIDOCAINE HCL (PF) 2 % IJ SOLN
10.0000 mL | Freq: Once | INTRAMUSCULAR | Status: AC
  Administered 2013-04-17: 10 mL

## 2013-04-17 MED ORDER — GENTAMICIN SULFATE 40 MG/ML IJ SOLN
160.0000 mg | Freq: Once | INTRAMUSCULAR | Status: AC
  Administered 2013-04-17: 160 mg via INTRAMUSCULAR

## 2013-04-17 MED ORDER — GENTAMICIN SULFATE 40 MG/ML IJ SOLN
INTRAMUSCULAR | Status: AC
  Filled 2013-04-17: qty 4

## 2013-04-17 NOTE — Progress Notes (Signed)
Biopsy complete no signs of distress  

## 2013-04-17 NOTE — Discharge Instructions (Signed)
Prostate Biopsy TRUS Biopsy BEFORE THE TEST   Do not take aspirin. Do not take any medicine that has aspirin in it 7 days before your biopsy.  You may be given a medicine to take on the day of your biopsy.  You may also be given a medicine or treatment to help you go poop (laxative or enema). AFTER THE TEST  Only take medicine as told by your doctor.  It is normal to have some bleeding from your rectum for the first 5 days.  You may have blood in your pee (urine) or sperm. Finding out the results of your test Ask when your test results will be ready. Make sure you get your test results. GET HELP RIGHT AWAY IF:  You have a temperature by mouth above 102 F (38.9 C), not controlled by medicine.  You have blood in your pee for more than 5 days.  You have a lot of blood in your pee.  You have bleeding from your rectum for more than 5 days or have a lot of blood in your poop (feces).  You have severe pain. Document Released: 01/31/2009 Document Revised: 05/07/2011 Document Reviewed: 10/01/2012 [facility] Patient Information 2014 [place], [place].

## 2013-04-22 ENCOUNTER — Other Ambulatory Visit: Payer: Self-pay | Admitting: Cardiovascular Disease

## 2013-04-22 NOTE — Telephone Encounter (Signed)
Rx was sent to pharmacy electronically. 

## 2013-04-24 ENCOUNTER — Other Ambulatory Visit: Payer: Self-pay | Admitting: Cardiovascular Disease

## 2013-05-02 ENCOUNTER — Other Ambulatory Visit: Payer: Self-pay | Admitting: Cardiovascular Disease

## 2013-05-04 NOTE — Telephone Encounter (Signed)
Rx was sent to pharmacy electronically. 

## 2013-05-18 ENCOUNTER — Other Ambulatory Visit: Payer: Self-pay | Admitting: *Deleted

## 2013-05-18 MED ORDER — LISINOPRIL 20 MG PO TABS: 20.0000 mg | ORAL_TABLET | Freq: Two times a day (BID) | ORAL | Status: DC

## 2013-05-18 NOTE — Telephone Encounter (Signed)
Rx refill sent to pharmacy. 

## 2013-06-10 ENCOUNTER — Encounter: Payer: Self-pay | Admitting: Urology

## 2013-07-08 ENCOUNTER — Other Ambulatory Visit: Payer: Self-pay | Admitting: *Deleted

## 2013-07-08 MED ORDER — SIMVASTATIN 40 MG PO TABS: 40.0000 mg | ORAL_TABLET | Freq: Every day | ORAL | Status: DC

## 2013-07-08 NOTE — Telephone Encounter (Signed)
Rx refill sent to patient pharmacy   

## 2013-07-14 ENCOUNTER — Other Ambulatory Visit: Payer: Self-pay | Admitting: *Deleted

## 2013-07-14 MED ORDER — POTASSIUM CHLORIDE CRYS ER 20 MEQ PO TBCR: 20.0000 meq | EXTENDED_RELEASE_TABLET | Freq: Every day | ORAL | Status: DC

## 2013-07-14 NOTE — Telephone Encounter (Signed)
Rx was sent to pharmacy electronically. 

## 2013-07-17 ENCOUNTER — Ambulatory Visit (INDEPENDENT_AMBULATORY_CARE_PROVIDER_SITE_OTHER): Payer: Commercial Managed Care - HMO | Admitting: Cardiology

## 2013-07-17 ENCOUNTER — Encounter: Payer: Self-pay | Admitting: Cardiology

## 2013-07-17 VITALS — BP 158/90 | HR 67 | Ht 68.0 in | Wt 184.0 lb

## 2013-07-17 DIAGNOSIS — I2510 Atherosclerotic heart disease of native coronary artery without angina pectoris: Secondary | ICD-10-CM

## 2013-07-17 DIAGNOSIS — I739 Peripheral vascular disease, unspecified: Secondary | ICD-10-CM

## 2013-07-17 DIAGNOSIS — I10 Essential (primary) hypertension: Secondary | ICD-10-CM

## 2013-07-17 DIAGNOSIS — F17200 Nicotine dependence, unspecified, uncomplicated: Secondary | ICD-10-CM

## 2013-07-17 DIAGNOSIS — Z720 Tobacco use: Secondary | ICD-10-CM | POA: Insufficient documentation

## 2013-07-17 DIAGNOSIS — E785 Hyperlipidemia, unspecified: Secondary | ICD-10-CM

## 2013-07-17 NOTE — Progress Notes (Signed)
07/17/2013   PCP: Leonides Grills, MD   Chief Complaint  Patient presents with  . Follow-up    6 months follow-up, pt stated he's been doing pretty good    Primary Cardiologist: Dr. Gwenlyn Found  HPI:  68 year old mildly overweight divorced African American male, father of 2, grandfather to 4 grandchildren, who Dr. Gwenlyn Found saw in the office 6 months ago. He had a non-ST-segment-elevation myocardial infarction in April 2010. He was catheterized revealing high-grade proximal LAD stenosis, which was stented using a 2.75 x 18 mm long Xience V drug-eluting stent. The procedure did jail the first diagonal branch, he had angioplasty at the ostium, reducing an 80% stenosis to less than 40%. He had a dominant circumflex system with an 80% distal AV groove circumflex as well as a nondominant RCA. He had normal LV function. Abdominal aortogram revealed a patent aortobifemoral bypass graft, which was placed approximately 15 years ago. He has known occluded SFAs bilaterally with ABIs that run in the 0.5 or 0.6 range. He denies chest pain, shortness of breath, or claudication. He continues to smoke 3 cigarettes per day despite counseling to the contrary.  Dr. Orson Ape follows his lipid profile.since I saw him in the office/7/14   Today on 6 month follow up, he denies chest pain or shortness of breath.  He has no complaints except a cold.  He has been taking cordicin D for his cold, which may explain BP elevation.     No Known Allergies  Current Outpatient Prescriptions  Medication Sig Dispense Refill  . aspirin 81 MG chewable tablet Chew 81 mg by mouth daily.        . clobetasol cream (TEMOVATE) [id number] % Apply 1 application topically as needed.      . clopidogrel (PLAVIX) 75 MG tablet Take 1 tablet (75 mg total) by mouth daily.  30 tablet  6  . hydrochlorothiazide (HYDRODIURIL) 25 MG tablet Take 25 mg by mouth daily.      Marland Kitchen lisinopril (PRINIVIL,ZESTRIL) 20 MG tablet Take 1 tablet (20 mg total)  by mouth 2 (two) times daily.  60 tablet  6  . metFORMIN (GLUCOPHAGE) 500 MG tablet Take 500 mg by mouth 2 (two) times daily with a meal.      . metoprolol tartrate (LOPRESSOR) 25 MG tablet Take 1 tablet (25 mg total) by mouth 2 (two) times daily.  60 tablet  10  . NITROSTAT 0.4 MG SL tablet TAKE 1 TABLET UNDER THE TONGUE EVERY 5MINS. X3 AS NEEDED FOR CHEST PAIN  25 tablet  3  . potassium chloride (K-DUR,KLOR-CON) 20 MEQ tablet Take 1 tablet (20 mEq total) by mouth daily.  30 tablet  4  . simvastatin (ZOCOR) 40 MG tablet Take 1 tablet (40 mg total) by mouth at bedtime.  30 tablet  5  . ZETIA 10 MG tablet TAKE 1 TABLET EVERY DAY  30 tablet  8  . [DISCONTINUED] potassium chloride (KLOR-CON) 20 MEQ packet TAKE 1 TABLET BY MOUTH EVERY DAY  30 tablet  6   No current facility-administered medications for this visit.    Past Medical History  Diagnosis Date  . MI, old   . Hypertension   . Coronary artery disease   . Peripheral arterial disease   . Tobacco abuse   . Hypertension   . Hyperlipidemia   . Type 2 diabetes mellitus     Past Surgical History  Procedure Laterality Date  . Coronary stent placement  [id number] colds or fevers, no weight changes Skin:no rashes or ulcers HEENT:no blurred vision, no congestion CV:see HPI PUL:see HPI GI:no diarrhea constipation or melena, no indigestion GU:no hematuria, no dysuria MS:no joint pain, no claudication Neuro:no syncope, no lightheadedness Endo:+ diabetes- does not do accuchecks , no thyroid disease  PHYSICAL EXAM BP 158/90  Pulse 67  Ht 5\' 8"  (1.727 m)  Wt 184 lb (83.462 kg)  BMI 27.98 kg/m2 General:Pleasant affect, NAD Skin:Warm and dry, brisk capillary refill HEENT:normocephalic, sclera clear, mucus membranes moist Neck:supple, no JVD, no bruits, no adenopathy  Heart:S1S2 RRR without murmur, gallup, rub or click Lungs:clear without rales,+  Rhonchi bil bases, no wheezes [id number], non tender, + BS, do not palpate  liver spleen or masses Ext:no lower ext edema, 2+ radial pulses, no trauma Neuro:alert and oriented, MAE, follows commands, + facial symmetry  EKG:SR rate 67 no acute changes.  ASSESSMENT AND PLAN Coronary artery disease No chest pain, no SOB, EKG stable.  Follow up with Dr. Gwenlyn Found in 6 months.  Peripheral arterial disease stable  Tobacco use Has decreased to 2-3 cigarettes a day.  We briefly discussed stopping.  Essential hypertension BP up slightly, has been taking coricidin D for cold symptoms.  Discussed using musinex  Hyperlipidemia Followed by PCP, will call his office for recent results, pt is on Zetia and Zocor

## 2013-07-17 NOTE — Assessment & Plan Note (Signed)
stable °

## 2013-07-17 NOTE — Assessment & Plan Note (Signed)
No chest pain, no SOB, EKG stable.  Follow up with Dr. Gwenlyn Found in 6 months.

## 2013-07-17 NOTE — Assessment & Plan Note (Signed)
BP up slightly, has been taking coricidin D for cold symptoms.  Discussed using musinex

## 2013-07-17 NOTE — Assessment & Plan Note (Signed)
Followed by PCP, will call his office for recent results, pt is on Zetia and Zocor

## 2013-07-17 NOTE — Patient Instructions (Signed)
Continue to decrease smoking.  Plan is to stop.  Heart Healthy diabetic diet  Follow up with Dr. Gwenlyn Found in 6 months, call if any problems in the meantime.   For your cold, use musinex 1200 mg twice a day for a week.  Best NOT to use cold meds with "D" after the name- those may increase your BP.

## 2013-07-17 NOTE — Assessment & Plan Note (Signed)
Has decreased to 2-3 cigarettes a day.  We briefly discussed stopping.

## 2013-08-10 ENCOUNTER — Other Ambulatory Visit: Payer: Self-pay

## 2013-08-10 MED ORDER — METOPROLOL TARTRATE 25 MG PO TABS: 25.0000 mg | ORAL_TABLET | Freq: Two times a day (BID) | ORAL | Status: DC

## 2013-08-10 NOTE — Telephone Encounter (Signed)
Rx was sent to pharmacy electronically. 

## 2013-12-04 ENCOUNTER — Other Ambulatory Visit: Payer: Self-pay

## 2013-12-04 MED ORDER — POTASSIUM CHLORIDE CRYS ER 20 MEQ PO TBCR: 20.0000 meq | EXTENDED_RELEASE_TABLET | Freq: Every day | ORAL | Status: DC

## 2013-12-04 MED ORDER — LISINOPRIL 20 MG PO TABS: 20.0000 mg | ORAL_TABLET | Freq: Two times a day (BID) | ORAL | Status: DC

## 2013-12-04 NOTE — Telephone Encounter (Signed)
Rx was sent to pharmacy electronically. 

## 2013-12-22 ENCOUNTER — Telehealth (HOSPITAL_COMMUNITY): Payer: Self-pay | Admitting: *Deleted

## 2013-12-25 ENCOUNTER — Encounter (HOSPITAL_COMMUNITY): Payer: Self-pay | Admitting: *Deleted

## 2014-01-05 ENCOUNTER — Ambulatory Visit (HOSPITAL_COMMUNITY)
Admission: RE | Admit: 2014-01-05 | Discharge: 2014-01-05 | Disposition: A | Discharge status: Home / Self Care | Payer: Medicare HMO | Source: Ambulatory Visit | Attending: Cardiovascular Disease | Admitting: Cardiovascular Disease

## 2014-01-05 DIAGNOSIS — I672 Cerebral atherosclerosis: Secondary | ICD-10-CM | POA: Insufficient documentation

## 2014-01-05 DIAGNOSIS — I6529 Occlusion and stenosis of unspecified carotid artery: Secondary | ICD-10-CM

## 2014-01-05 NOTE — Progress Notes (Signed)
Carotid Duplex Completed. °Brianna L Mazza,RVT °

## 2014-01-08 ENCOUNTER — Encounter: Payer: Self-pay | Admitting: *Deleted

## 2014-01-12 ENCOUNTER — Other Ambulatory Visit: Payer: Self-pay

## 2014-01-12 MED ORDER — SIMVASTATIN 40 MG PO TABS: 40.0000 mg | ORAL_TABLET | Freq: Every day | ORAL | Status: DC

## 2014-01-12 NOTE — Telephone Encounter (Signed)
Rx sent to pharmacy   

## 2014-02-15 ENCOUNTER — Telehealth: Payer: Self-pay | Admitting: Cardiovascular Disease

## 2014-02-15 MED ORDER — EZETIMIBE 10 MG PO TABS: 10.0000 mg | ORAL_TABLET | Freq: Every day | ORAL | Status: DC

## 2014-02-15 NOTE — Telephone Encounter (Signed)
Samples provided to patient. He states it costs him $45 previously, $77 in October, $107 the last time. Advised patient to contact us if still costly in 2016 - may be in donut hold now

## 2014-02-15 NOTE — Telephone Encounter (Signed)
Pt says Zetia is getting too expensive,what can he take in the place of it?

## 2014-03-08 ENCOUNTER — Other Ambulatory Visit: Payer: Self-pay | Admitting: Cardiovascular Disease

## 2014-03-17 ENCOUNTER — Other Ambulatory Visit: Payer: Self-pay | Admitting: Cardiovascular Disease

## 2014-03-17 NOTE — Telephone Encounter (Signed)
Rx(s) sent to pharmacy electronically.  

## 2014-03-21 ENCOUNTER — Other Ambulatory Visit: Payer: Self-pay | Admitting: Cardiovascular Disease

## 2014-03-21 NOTE — Telephone Encounter (Signed)
Rx(s) sent to pharmacy electronically.  

## 2014-05-24 ENCOUNTER — Other Ambulatory Visit: Payer: Self-pay | Admitting: Cardiovascular Disease

## 2014-05-24 NOTE — Telephone Encounter (Signed)
Rx refill sent to patient pharmacy   

## 2014-06-03 ENCOUNTER — Other Ambulatory Visit: Payer: Self-pay | Admitting: Cardiovascular Disease

## 2014-07-16 ENCOUNTER — Other Ambulatory Visit: Payer: Self-pay | Admitting: Cardiovascular Disease

## 2014-07-16 NOTE — Telephone Encounter (Signed)
Rx(s) sent to pharmacy electronically.  

## 2014-08-09 ENCOUNTER — Other Ambulatory Visit: Payer: Self-pay | Admitting: Cardiovascular Disease

## 2014-08-15 ENCOUNTER — Other Ambulatory Visit: Payer: Self-pay | Admitting: Cardiovascular Disease

## 2014-08-16 ENCOUNTER — Telehealth: Payer: Self-pay | Admitting: Cardiovascular Disease

## 2014-08-16 NOTE — Telephone Encounter (Signed)
Rx(s) sent to pharmacy electronically.  

## 2014-08-16 NOTE — Telephone Encounter (Signed)
Closed encounter °

## 2014-08-24 ENCOUNTER — Other Ambulatory Visit: Payer: Self-pay | Admitting: Cardiovascular Disease

## 2014-08-27 ENCOUNTER — Other Ambulatory Visit: Payer: Self-pay | Admitting: Cardiovascular Disease

## 2014-08-27 NOTE — Telephone Encounter (Signed)
Rx(s) sent to pharmacy electronically. OV 09/24/14

## 2014-08-28 ENCOUNTER — Other Ambulatory Visit: Payer: Self-pay | Admitting: Cardiovascular Disease

## 2014-08-31 NOTE — Telephone Encounter (Signed)
Rx(s) sent to pharmacy electronically.  

## 2014-09-24 ENCOUNTER — Ambulatory Visit: Payer: PPO | Admitting: Cardiovascular Disease

## 2014-09-29 ENCOUNTER — Other Ambulatory Visit: Payer: Self-pay | Admitting: Cardiovascular Disease

## 2014-09-29 NOTE — Telephone Encounter (Signed)
REFILL 

## 2014-10-15 ENCOUNTER — Other Ambulatory Visit: Payer: Self-pay | Admitting: Cardiovascular Disease

## 2014-10-18 NOTE — Telephone Encounter (Signed)
Rx(s) sent to pharmacy electronically.  

## 2014-10-20 ENCOUNTER — Encounter: Payer: Self-pay | Admitting: Cardiovascular Disease

## 2014-10-27 ENCOUNTER — Encounter: Payer: Self-pay | Admitting: Cardiovascular Disease

## 2014-10-27 ENCOUNTER — Ambulatory Visit (INDEPENDENT_AMBULATORY_CARE_PROVIDER_SITE_OTHER): Payer: PPO | Admitting: Cardiovascular Disease

## 2014-10-27 VITALS — BP 130/62 | HR 93 | Ht 68.0 in | Wt 179.0 lb

## 2014-10-27 DIAGNOSIS — I10 Essential (primary) hypertension: Secondary | ICD-10-CM

## 2014-10-27 DIAGNOSIS — Z720 Tobacco use: Secondary | ICD-10-CM

## 2014-10-27 DIAGNOSIS — E785 Hyperlipidemia, unspecified: Secondary | ICD-10-CM | POA: Diagnosis not present

## 2014-10-27 DIAGNOSIS — I739 Peripheral vascular disease, unspecified: Secondary | ICD-10-CM

## 2014-10-27 DIAGNOSIS — I2510 Atherosclerotic heart disease of native coronary artery without angina pectoris: Secondary | ICD-10-CM

## 2014-10-27 DIAGNOSIS — I2583 Coronary atherosclerosis due to lipid rich plaque: Secondary | ICD-10-CM

## 2014-10-27 NOTE — Progress Notes (Signed)
10/27/2014 Peter Lambert   1945-12-12  [id number]  Primary Physician Leonides Grills, MD Primary Cardiologist: Lorretta Harp MD Renae Gloss   HPI:  The patient is a very pleasant 69 year old mildly overweight divorced African American male, father of 2, grandfather to 4 grandchildren, who I last saw in the office 12/03/12. He had a non-ST-segment-elevation myocardial infarction in April 2010. I catheterized him revealing high-grade proximal LAD stenosis, which I stented using a 2.75 x 18 mm long Xience V drug-eluting stent. I did jail the first diagonal branch, performed angioplasty at the ostium, reducing an 80% stenosis to less than 40%. He had a dominant circumflex system with an 80% distal AV groove circumflex as well as a nondominant RCA. He had normal LV function. Abdominal aortogram revealed a patent aortobifemoral bypass graft, which was placed approximately 15 years ago. He has known occluded SFAs bilaterally with ABIs that run in the 0.5 or 0.6 range. He denies chest pain, shortness of breath, or claudication. He continues to smoke 3 packs of cigarettes per week despite counseling to the contrary. He did stop smoking for a while and then restarted back and a third pack a day.  Current Outpatient Prescriptions  Medication Sig Dispense Refill  . aspirin 81 MG chewable tablet Chew 81 mg by mouth daily.      . clobetasol cream (TEMOVATE) [id number] % Apply 1 application topically as needed.    . clopidogrel (PLAVIX) 75 MG tablet Take 1 tablet (75 mg total) by mouth once. Please keep upcoming appointment 30 tablet 6  . ezetimibe (ZETIA) 10 MG tablet Take 1 tablet (10 mg total) by mouth daily. 28 tablet 0  . hydrochlorothiazide (HYDRODIURIL) 25 MG tablet Take 25 mg by mouth daily.    Marland Kitchen KLOR-CON M20 20 MEQ tablet TAKE 1 TABLET (20 MEQ TOTAL) BY MOUTH DAILY. 30 tablet 1  . lisinopril (PRINIVIL,ZESTRIL) 20 MG tablet TAKE 1 TABLET (20 MG TOTAL) BY MOUTH 2 (TWO) TIMES DAILY. 60  tablet 1  . metFORMIN (GLUCOPHAGE) 500 MG tablet Take 500 mg by mouth 2 (two) times daily with a meal.    . metoprolol tartrate (LOPRESSOR) 25 MG tablet TAKE 1 TABLET (25 MG TOTAL) BY MOUTH 2 (TWO) TIMES DAILY. (Patient taking differently: TAKE 1 TABLET (25 MG TOTAL) BY MOUTH ONCE DAILY.) 60 tablet 0  . NITROSTAT 0.4 MG SL tablet PLACE 1 TABLET UNDER THE TONGUE EVERY 5 MINUTES AS NEEDED FOR CHEST PAIN 25 tablet 0  . simvastatin (ZOCOR) 40 MG tablet Take 1 tablet (40 mg total) by mouth daily. Please keep upcoming appointment 30 tablet 6  . [DISCONTINUED] potassium chloride (KLOR-CON) 20 MEQ packet TAKE 1 TABLET BY MOUTH EVERY DAY 30 tablet 6   No current facility-administered medications for this visit.    No Known Allergies  Social History   Social History  . Marital Status: Divorced    Spouse Name: N/A  . Number of Children: N/A  . Years of Education: N/A   Occupational History  . Not on file.   Social History Main Topics  . Smoking status: Current Every Day Smoker -- 0.25 packs/day for 52 years    Types: Cigarettes  . Smokeless tobacco: Never Used  . Alcohol Use: No  . Drug Use: No  . Sexual Activity: No   Other Topics Concern  . Not on file   Social History Narrative     Review of Systems: General: negative for chills, fever, night sweats or weight changes.  Cardiovascular: negative for chest pain, dyspnea on exertion, edema, orthopnea, palpitations, paroxysmal nocturnal dyspnea or shortness of breath Dermatological: negative for rash Respiratory: negative for cough or wheezing Urologic: negative for hematuria Abdominal: negative for nausea, vomiting, diarrhea, bright red blood per rectum, melena, or hematemesis Neurologic: negative for visual changes, syncope, or dizziness All other systems reviewed and are otherwise negative except as noted above.    Blood pressure 130/62, pulse 93, height 5\' 8"  (1.727 m), weight 179 lb (81.194 kg).  General appearance: alert  and no distress Neck: no adenopathy, no carotid bruit, no JVD, supple, symmetrical, trachea midline and thyroid not enlarged, symmetric, no tenderness/mass/nodules Lungs: clear to auscultation bilaterally Heart: regular rate and rhythm, S1, S2 normal, no murmur, click, rub or gallop Extremities: extremities normal, atraumatic, no cyanosis or edema  EKG not performed today  ASSESSMENT AND PLAN:   Tobacco use The patient was smoking 3 packs a day then stopped. He started smoking again and now smokes one third of a pack a day  Peripheral arterial disease History of remote aortobifemoral bypass grafting with ABIs in the 0.5 range bilaterally and occluded SFAs by duplex ultrasound. The patient denies claudication.  Hyperlipidemia History of hyperlipidemia on some statin 40 mg a day followed by his PCP  Essential hypertension History of hypertension blood pressure measured at 130/62. He is on metoprolol and lisinopril as well as hydrochlorothiazide. Continue current meds at current dosing  Coronary artery disease History of CAD status post non-ST segment elevation myocardial infarction April 2010. I catheterized him revealing a high-grade proximal LAD stenosis which I stented using a 2.75 mm x 18 mm long Xience drug-eluting stent. I did she'll first diagonal branch and performed angioplasty of the ostium through the stent strut. He had a dominant circumflex system with 80% distal AV groove circumflex stenosis as well as a nondominant RCA with normal LV function. He denies chest pain or shortness of breath.      Lorretta Harp MD FACP,FACC,FAHA, [facility] 10/27/2014 3:33 PM

## 2014-10-27 NOTE — Assessment & Plan Note (Addendum)
History of CAD status post non-ST segment elevation myocardial infarction April 2010. I catheterized him revealing a high-grade proximal LAD stenosis which I stented using a 2.75 mm x 18 mm long Xience drug-eluting stent. I did she'll first diagonal branch and performed angioplasty of the ostium through the stent strut. He had a dominant circumflex system with 80% distal AV groove circumflex stenosis as well as a nondominant RCA with normal LV function. He denies chest pain or shortness of breath.

## 2014-10-27 NOTE — Patient Instructions (Signed)
Your physician wants you to follow-up in: 1 year with Dr Berry. You will receive a reminder letter in the mail two months in advance. If you don't receive a letter, please call our office to schedule the follow-up appointment.  

## 2014-10-27 NOTE — Assessment & Plan Note (Signed)
History of hypertension blood pressure measured at 130/62. He is on metoprolol and lisinopril as well as hydrochlorothiazide. Continue current meds at current dosing

## 2014-10-27 NOTE — Assessment & Plan Note (Signed)
History of hyperlipidemia on some statin 40 mg a day followed by his PCP 

## 2014-10-27 NOTE — Assessment & Plan Note (Signed)
The patient was smoking 3 packs a day then stopped. He started smoking again and now smokes one third of a pack a day

## 2014-10-27 NOTE — Assessment & Plan Note (Signed)
History of remote aortobifemoral bypass grafting with ABIs in the 0.5 range bilaterally and occluded SFAs by duplex ultrasound. The patient denies claudication.

## 2014-12-02 ENCOUNTER — Other Ambulatory Visit: Payer: Self-pay | Admitting: Cardiovascular Disease

## 2014-12-02 NOTE — Telephone Encounter (Signed)
Rx request sent to pharmacy.  

## 2014-12-14 ENCOUNTER — Other Ambulatory Visit: Payer: Self-pay | Admitting: Cardiovascular Disease

## 2015-01-27 ENCOUNTER — Other Ambulatory Visit: Payer: Self-pay

## 2015-01-27 MED ORDER — CLOPIDOGREL BISULFATE 75 MG PO TABS: 75.0000 mg | ORAL_TABLET | Freq: Once | ORAL | Status: DC

## 2015-01-28 ENCOUNTER — Other Ambulatory Visit: Payer: Self-pay | Admitting: *Deleted

## 2015-01-28 MED ORDER — CLOPIDOGREL BISULFATE 75 MG PO TABS: 75.0000 mg | ORAL_TABLET | Freq: Once | ORAL | Status: DC

## 2015-01-28 MED ORDER — SIMVASTATIN 40 MG PO TABS: 40.0000 mg | ORAL_TABLET | Freq: Every day | ORAL | Status: DC

## 2015-03-22 ENCOUNTER — Other Ambulatory Visit: Payer: Self-pay | Admitting: Cardiovascular Disease

## 2015-03-22 NOTE — Telephone Encounter (Signed)
Rx request sent to pharmacy.  

## 2015-03-24 ENCOUNTER — Other Ambulatory Visit: Payer: Self-pay | Admitting: Cardiovascular Disease

## 2015-10-18 ENCOUNTER — Other Ambulatory Visit: Payer: Self-pay | Admitting: Cardiovascular Disease

## 2015-10-18 NOTE — Telephone Encounter (Signed)
Rx(s) sent to pharmacy electronically.  

## 2015-11-19 ENCOUNTER — Other Ambulatory Visit: Payer: Self-pay | Admitting: Cardiovascular Disease

## 2015-11-21 NOTE — Telephone Encounter (Signed)
Rx request sent to pharmacy.  

## 2015-12-26 ENCOUNTER — Other Ambulatory Visit: Payer: Self-pay | Admitting: Cardiovascular Disease

## 2016-01-18 ENCOUNTER — Other Ambulatory Visit: Payer: Self-pay | Admitting: Cardiovascular Disease

## 2016-01-18 NOTE — Telephone Encounter (Signed)
Rx request sent to pharmacy.  

## 2016-01-20 ENCOUNTER — Other Ambulatory Visit: Payer: Self-pay | Admitting: Cardiovascular Disease

## 2016-01-23 ENCOUNTER — Other Ambulatory Visit: Payer: Self-pay | Admitting: Cardiovascular Disease

## 2016-01-23 NOTE — Telephone Encounter (Signed)
REFILL 

## 2016-02-23 ENCOUNTER — Other Ambulatory Visit: Payer: Self-pay | Admitting: *Deleted

## 2016-02-23 MED ORDER — POTASSIUM CHLORIDE CRYS ER 20 MEQ PO TBCR: 20.0000 meq | EXTENDED_RELEASE_TABLET | Freq: Every day | ORAL | 0 refills | Status: DC

## 2016-02-24 ENCOUNTER — Other Ambulatory Visit: Payer: Self-pay | Admitting: Cardiovascular Disease

## 2016-03-03 ENCOUNTER — Other Ambulatory Visit: Payer: Self-pay | Admitting: Cardiovascular Disease

## 2016-03-08 ENCOUNTER — Telehealth: Payer: Self-pay | Admitting: Cardiovascular Disease

## 2016-03-08 ENCOUNTER — Other Ambulatory Visit: Payer: Self-pay | Admitting: Cardiovascular Disease

## 2016-03-08 MED ORDER — POTASSIUM CHLORIDE CRYS ER 20 MEQ PO TBCR: 20.0000 meq | EXTENDED_RELEASE_TABLET | Freq: Every day | ORAL | 0 refills | Status: DC

## 2016-03-08 NOTE — Telephone Encounter (Signed)
Returned call to patient-patient reports his potassium was denied for refills.  Per chart review patient is due for OV.   OV scheduled for 2/7 with Dr. Gwenlyn Found at 2:15.  Rx sent to pharmacy.    Pt aware and verbalized understanding.

## 2016-03-08 NOTE — Telephone Encounter (Signed)
Mr. Stepanek is calling because his medication refill was denied and he would like to inquire why. Please call, thanks. **could not understand which medication he stated

## 2016-04-04 ENCOUNTER — Ambulatory Visit (INDEPENDENT_AMBULATORY_CARE_PROVIDER_SITE_OTHER): Payer: Medicare HMO | Admitting: Cardiovascular Disease

## 2016-04-04 ENCOUNTER — Encounter: Payer: Self-pay | Admitting: Cardiovascular Disease

## 2016-04-04 VITALS — BP 208/78 | HR 85

## 2016-04-04 DIAGNOSIS — Z720 Tobacco use: Secondary | ICD-10-CM

## 2016-04-04 DIAGNOSIS — E7800 Pure hypercholesterolemia, unspecified: Secondary | ICD-10-CM

## 2016-04-04 DIAGNOSIS — I10 Essential (primary) hypertension: Secondary | ICD-10-CM | POA: Diagnosis not present

## 2016-04-04 DIAGNOSIS — R0989 Other specified symptoms and signs involving the circulatory and respiratory systems: Secondary | ICD-10-CM | POA: Diagnosis not present

## 2016-04-04 DIAGNOSIS — I779 Disorder of arteries and arterioles, unspecified: Secondary | ICD-10-CM | POA: Diagnosis not present

## 2016-04-04 DIAGNOSIS — I2510 Atherosclerotic heart disease of native coronary artery without angina pectoris: Secondary | ICD-10-CM

## 2016-04-04 DIAGNOSIS — I739 Peripheral vascular disease, unspecified: Principal | ICD-10-CM

## 2016-04-04 MED ORDER — AMLODIPINE BESYLATE 10 MG PO TABS: 10.0000 mg | ORAL_TABLET | Freq: Every day | ORAL | 11 refills | Status: DC

## 2016-04-04 NOTE — Progress Notes (Signed)
04/04/2016 Peter Lambert   05/16/1945  [id number]  Primary Physician Leonides Grills, MD Primary Cardiologist: Lorretta Harp MD Lupe Carney, [place]  HPI:   The patient is a very pleasant 71 year old mildly overweight divorced African American male, father of 12, grandfather to 4 grandchildren, who I last saw in the office 10/27/14 . He had a non-ST-segment-elevation myocardial infarction in April 2010. I catheterized him revealing high-grade proximal LAD stenosis, which I stented using a 2.75 x 18 mm long Xience V drug-eluting stent. I did jail the first diagonal branch, performed angioplasty at the ostium, reducing an 80% stenosis to less than 40%. He had a dominant circumflex system with an 80% distal AV groove circumflex as well as a nondominant RCA. He had normal LV function. Abdominal aortogram revealed a patent aortobifemoral bypass graft, which was placed approximately 15 years ago. He has known occluded SFAs bilaterally with ABIs that run in the 0.5 or 0.6 range. He denies chest pain, shortness of breath, or claudication. He continues to smoke 3 packs of cigarettes per week despite counseling to the contrary. He did stop smoking for a while and then restarted back and a third pack a day.   Current Outpatient Prescriptions  Medication Sig Dispense Refill  . amLODipine (NORVASC) 10 MG tablet Take 1 tablet (10 mg total) by mouth daily. 30 tablet 11  . aspirin 81 MG chewable tablet Chew 81 mg by mouth daily.      . clopidogrel (PLAVIX) 75 MG tablet Take 1 tablet (75 mg total) by mouth once. Please keep upcoming appointment 90 tablet 3  . hydrochlorothiazide (HYDRODIURIL) 25 MG tablet Take 25 mg by mouth daily.    Marland Kitchen lisinopril (PRINIVIL,ZESTRIL) 20 MG tablet TAKE 1 TABLET (20 MG TOTAL) BY MOUTH 2 (TWO) TIMES DAILY. 60 tablet 1  . metoprolol tartrate (LOPRESSOR) 25 MG tablet TAKE 1 TABLET (25 MG TOTAL) BY MOUTH 2 (TWO) TIMES DAILY. (Patient taking differently: TAKE 1 TABLET (25 MG  TOTAL) BY MOUTH ONCE DAILY.) 60 tablet 0  . nitroGLYCERIN (NITROSTAT) 0.4 MG SL tablet PLACE 1 TABLET UNDER THE TONGUE EVERY 5 MINUTES AS NEEDED FOR CHEST PAIN 25 tablet 1  . potassium chloride SA (KLOR-CON M20) 20 MEQ tablet Take 1 tablet (20 mEq total) by mouth daily. Please keep upcoming appointment. 45 tablet 0  . simvastatin (ZOCOR) 40 MG tablet TAKE 1 TABLET (40 MG TOTAL) BY MOUTH DAILY AT 6 PM. 30 tablet 0  . ZETIA 10 MG tablet TAKE 1 TABLET (10 MG TOTAL) BY MOUTH DAILY. KEEP UPCOMING APPOINTMENT 30 tablet 6   No current facility-administered medications for this visit.     No Known Allergies  Social History   Social History  . Marital status: Divorced    Spouse name: N/A  . Number of children: N/A  . Years of education: N/A   Occupational History  . Not on file.   Social History Main Topics  . Smoking status: Current Every Day Smoker    Packs/day: 0.25    Years: 52.00    Types: Cigarettes  . Smokeless tobacco: Never Used  . Alcohol use No  . Drug use: No  . Sexual activity: No   Other Topics Concern  . Not on file   Social History Narrative  . No narrative on file     Review of Systems: General: negative for chills, fever, night sweats or weight changes.  Cardiovascular: negative for chest pain, dyspnea on exertion, edema, orthopnea, palpitations,  paroxysmal nocturnal dyspnea or shortness of breath Dermatological: negative for rash Respiratory: negative for cough or wheezing Urologic: negative for hematuria Abdominal: negative for nausea, vomiting, diarrhea, bright red blood per rectum, melena, or hematemesis Neurologic: negative for visual changes, syncope, or dizziness All other systems reviewed and are otherwise negative except as noted above.    Blood pressure (!) 208/78, pulse 85.  General appearance: alert and no distress Neck: no adenopathy, no JVD, supple, symmetrical, trachea midline, thyroid not enlarged, symmetric, no tenderness/mass/nodules and  Soft bilateral carotid bruits Lungs: clear to auscultation bilaterally Heart: regular rate and rhythm, S1, S2 normal, no murmur, click, rub or gallop Extremities: extremities normal, atraumatic, no cyanosis or edema  EKG sinus rhythm at 85 evidence of LVH with repolarization changes. I personally reviewed this EKG  ASSESSMENT AND PLAN:   Peripheral arterial disease History of peripheral arterial disease status post remote aortobifemoral bypass grafting with known occluded SFAs bilaterally and ABIs to run in the 0.5-0.6 range. His last Dopplers performed 09/28/10 confirm these findings. He denies claudication.  Coronary artery disease History of CAD status post non-STEMI April 2010. I catheterized him revealing high-grade proximal LAD stenosis which I stented using a 2.75 mm x 18 mm long Xience 5 drug-eluting stent. I did jail his first branch and performed angioplasty of the ostium. Dominant circumflex system with a percent distal AV groove circumflex is also nondominant RCA with normal LV function. He denies chest pain or shortness of breath.  Essential hypertension History of hypertension blood pressures measured at 208/78. He does admit to dietary indiscretion. He is on lisinopril and metoprolol. I've asked him to keep a blood pressure log over the next month. He'll see Erasmo Downer back in the office after that to review the case findings and make further recommendations. I am going to increase his amlodipine from 5-10 mg a day.  Hyperlipidemia History of hyperlipidemia on Zetia and simvastatin followed by his PCP  Tobacco use History of ongoing tobacco abuse.      Lorretta Harp MD FACP,FACC,FAHA, [facility] 04/04/2016 2:59 PM

## 2016-04-04 NOTE — Assessment & Plan Note (Signed)
History of hyperlipidemia on Zetia and simvastatin followed by his PCP 

## 2016-04-04 NOTE — Assessment & Plan Note (Signed)
History of CAD status post non-STEMI April 2010. I catheterized him revealing high-grade proximal LAD stenosis which I stented using a 2.75 mm x 18 mm long Xience 5 drug-eluting stent. I did jail his first branch and performed angioplasty of the ostium. Dominant circumflex system with a percent distal AV groove circumflex is also nondominant RCA with normal LV function. He denies chest pain or shortness of breath.

## 2016-04-04 NOTE — Patient Instructions (Signed)
Medication Instructions: Increase Amlodipine to 10 mg daily.   Testing/Procedures: Your physician has requested that you have a carotid duplex. This test is an ultrasound of the carotid arteries in your neck. It looks at blood flow through these arteries that supply the brain with blood. Allow one hour for this exam. There are no restrictions or special instructions.  Follow-Up: Your physician recommends that you schedule a follow-up appointment in: 1 month with [facility] for BP Check. Your physician has requested that you regularly monitor and record your blood pressure readings at home. Please use the same machine at the same time of day to check your readings and record them to bring to your follow-up visit.  Your physician wants you to follow-up in: 1 year with Dr. Gwenlyn Found. You will receive a reminder letter in the mail two months in advance. If you don't receive a letter, please call our office to schedule the follow-up appointment.  If you need a refill on your cardiac medications before your next appointment, please call your pharmacy.

## 2016-04-04 NOTE — Assessment & Plan Note (Signed)
History of hypertension blood pressures measured at 208/78. He does admit to dietary indiscretion. He is on lisinopril and metoprolol. I've asked him to keep a blood pressure log over the next month. He'll see Erasmo Downer back in the office after that to review the case findings and make further recommendations. I am going to increase his amlodipine from 5-10 mg a day.

## 2016-04-04 NOTE — Assessment & Plan Note (Signed)
History of peripheral arterial disease status post remote aortobifemoral bypass grafting with known occluded SFAs bilaterally and ABIs to run in the 0.5-0.6 range. His last Dopplers performed 09/28/10 confirm these findings. He denies claudication.

## 2016-04-04 NOTE — Assessment & Plan Note (Signed)
History of ongoing tobacco abuse.

## 2016-04-09 ENCOUNTER — Emergency Department (HOSPITAL_COMMUNITY): Payer: Medicare HMO

## 2016-04-09 ENCOUNTER — Encounter (HOSPITAL_COMMUNITY): Payer: Self-pay | Admitting: Emergency Medicine

## 2016-04-09 ENCOUNTER — Emergency Department (HOSPITAL_COMMUNITY)
Admission: EM | Admit: 2016-04-09 | Discharge: 2016-04-10 | Disposition: A | Discharge status: Home / Self Care | Payer: Medicare HMO | Attending: Emergency Medicine | Admitting: Emergency Medicine

## 2016-04-09 DIAGNOSIS — J09X2 Influenza due to identified novel influenza A virus with other respiratory manifestations: Secondary | ICD-10-CM | POA: Insufficient documentation

## 2016-04-09 DIAGNOSIS — Z7982 Long term (current) use of aspirin: Secondary | ICD-10-CM | POA: Insufficient documentation

## 2016-04-09 DIAGNOSIS — E119 Type 2 diabetes mellitus without complications: Secondary | ICD-10-CM | POA: Diagnosis not present

## 2016-04-09 DIAGNOSIS — Z79899 Other long term (current) drug therapy: Secondary | ICD-10-CM | POA: Diagnosis not present

## 2016-04-09 DIAGNOSIS — I10 Essential (primary) hypertension: Secondary | ICD-10-CM | POA: Insufficient documentation

## 2016-04-09 DIAGNOSIS — R69 Illness, unspecified: Secondary | ICD-10-CM | POA: Diagnosis not present

## 2016-04-09 DIAGNOSIS — J189 Pneumonia, unspecified organism: Secondary | ICD-10-CM

## 2016-04-09 DIAGNOSIS — Z7984 Long term (current) use of oral hypoglycemic drugs: Secondary | ICD-10-CM | POA: Insufficient documentation

## 2016-04-09 DIAGNOSIS — J101 Influenza due to other identified influenza virus with other respiratory manifestations: Secondary | ICD-10-CM

## 2016-04-09 DIAGNOSIS — F17210 Nicotine dependence, cigarettes, uncomplicated: Secondary | ICD-10-CM | POA: Diagnosis not present

## 2016-04-09 DIAGNOSIS — R05 Cough: Secondary | ICD-10-CM | POA: Diagnosis not present

## 2016-04-09 DIAGNOSIS — R0602 Shortness of breath: Secondary | ICD-10-CM | POA: Diagnosis not present

## 2016-04-09 LAB — COMPREHENSIVE METABOLIC PANEL
ALT: 19 U/L (ref 17–63)
AST: 39 U/L (ref 15–41)
Albumin: 4.2 g/dL (ref 3.5–5.0)
Alkaline Phosphatase: 69 U/L (ref 38–126)
Anion gap: 10 (ref 5–15)
BUN: 13 mg/dL (ref 6–20)
CO2: 25 mmol/L (ref 22–32)
Calcium: 9 mg/dL (ref 8.9–10.3)
Chloride: 103 mmol/L (ref 101–111)
Creatinine, Ser: 1.17 mg/dL (ref 0.61–1.24)
GFR calc Af Amer: >60 mL/min (ref >60)
GFR calc non Af Amer: >60 mL/min (ref >60)
Glucose, Bld: 122 mg/dL — ABNORMAL HIGH (ref 65–99)
Potassium: 3.6 mmol/L (ref 3.5–5.1)
Sodium: 138 mmol/L (ref 135–145)
Total Bilirubin: 0.5 mg/dL (ref 0.3–1.2)
Total Protein: 8.5 g/dL — ABNORMAL HIGH (ref 6.5–8.1)

## 2016-04-09 LAB — CBC WITH DIFFERENTIAL/PLATELET
Basophils Absolute: 0 10*3/uL (ref 0.0–0.1)
Basophils Relative: 0 %
Eosinophils Absolute: 0 10*3/uL (ref 0.0–0.7)
Eosinophils Relative: 0 %
HCT: 34.5 % — ABNORMAL LOW (ref 39.0–52.0)
Hemoglobin: 11.3 g/dL — ABNORMAL LOW (ref 13.0–17.0)
Lymphocytes Relative: 11 %
Lymphs Abs: 1 10*3/uL (ref 0.7–4.0)
MCH: 27 pg (ref 26.0–34.0)
MCHC: 32.8 g/dL (ref 30.0–36.0)
MCV: 82.5 fL (ref 78.0–100.0)
Monocytes Absolute: 0.9 10*3/uL (ref 0.1–1.0)
Monocytes Relative: 10 %
Neutro Abs: 7 10*3/uL (ref 1.7–7.7)
Neutrophils Relative %: 79 %
Platelets: 220 10*3/uL (ref 150–400)
RBC: 4.18 MIL/uL — ABNORMAL LOW (ref 4.22–5.81)
RDW: 15.5 % (ref 11.5–15.5)
WBC: 9 10*3/uL (ref 4.0–10.5)

## 2016-04-09 LAB — LACTIC ACID, PLASMA: Lactic Acid, Venous: 1.5 mmol/L (ref 0.5–1.9)

## 2016-04-09 IMAGING — DX DG CHEST 2V
2 series · 2 of 2 positions shown · non-contrast
Comparison: Chest radiograph from [DATE]

CLINICAL DATA: Acute onset of cough, fever and shortness of breath.
Initial encounter.

EXAM:
CHEST  2 VIEW

[chest pa]
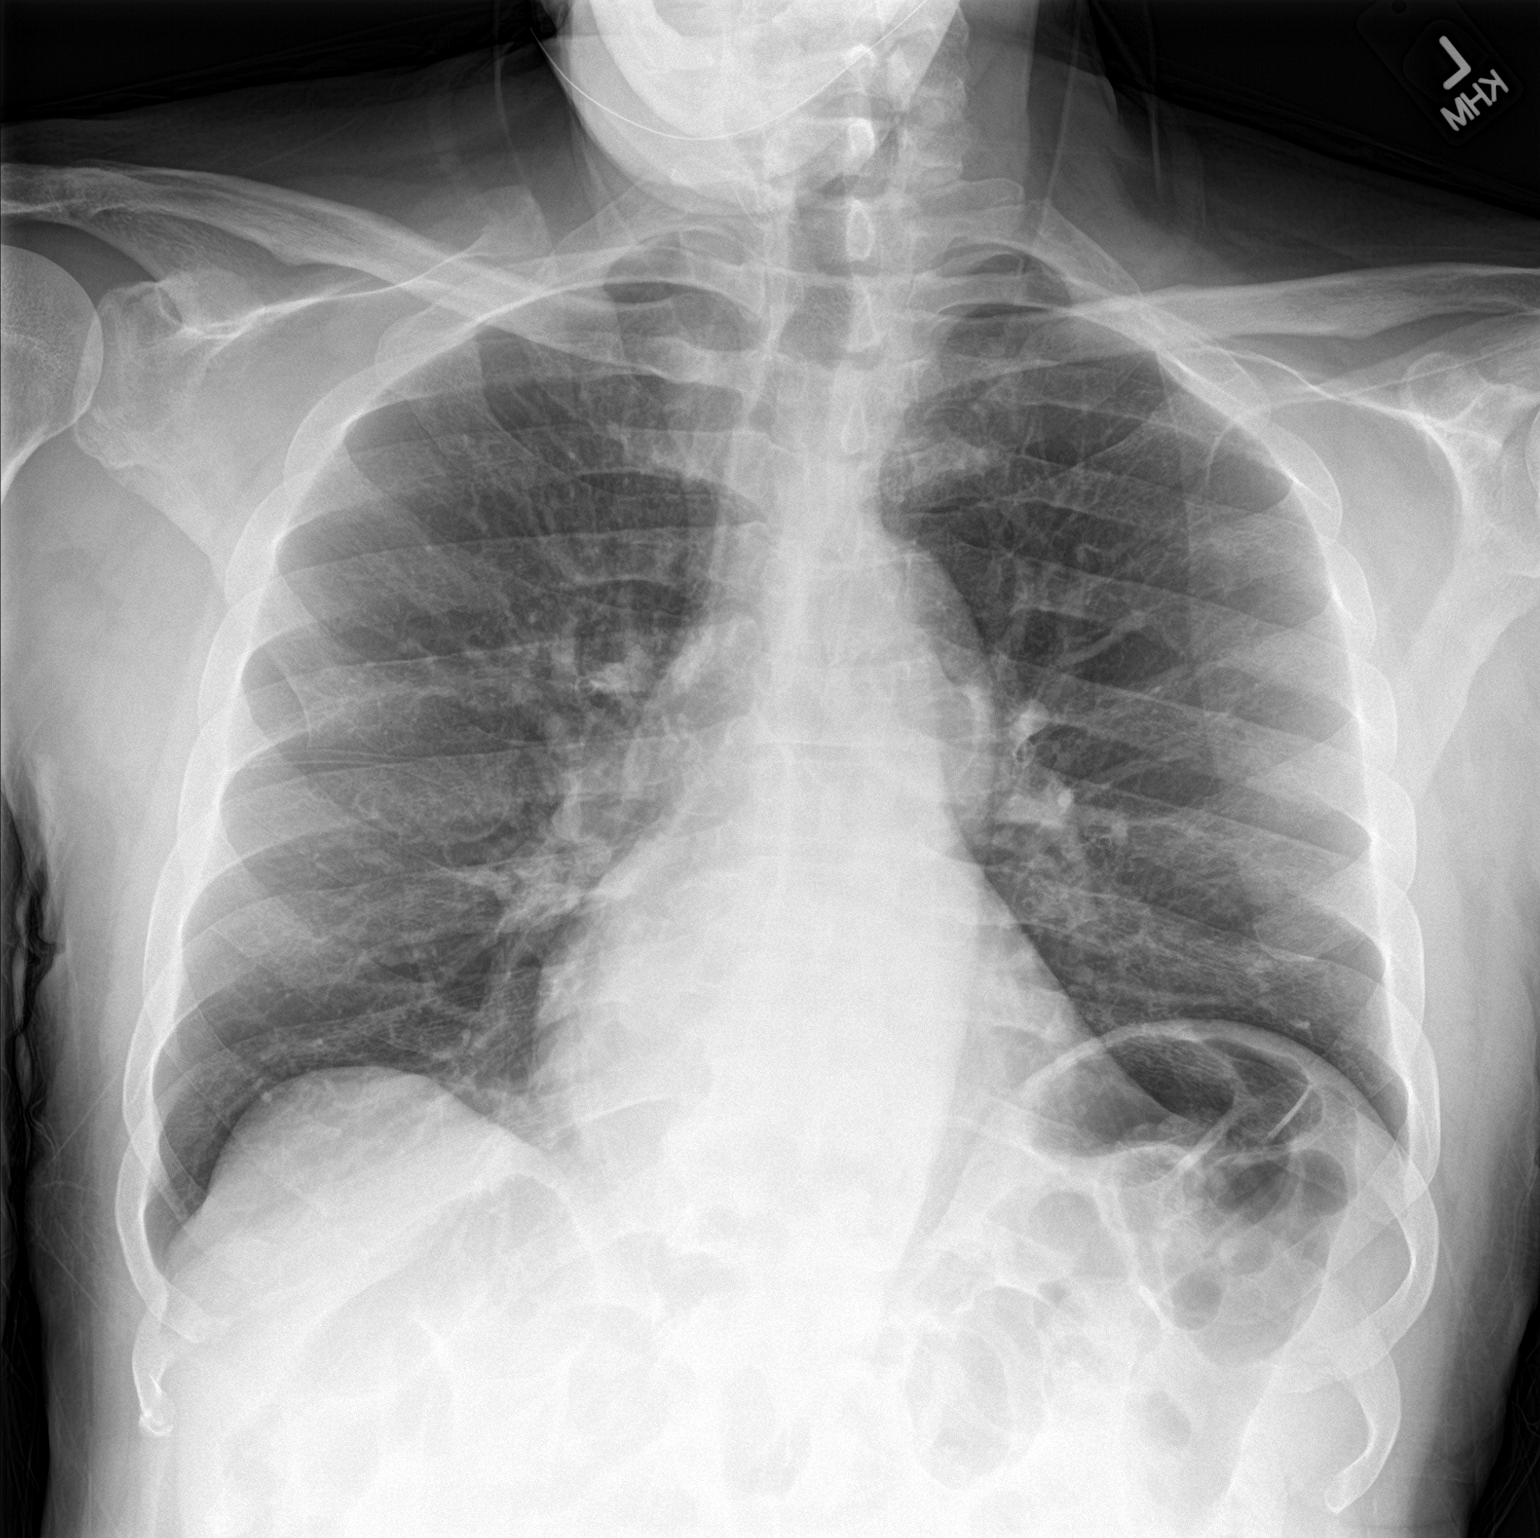

[chest lat]
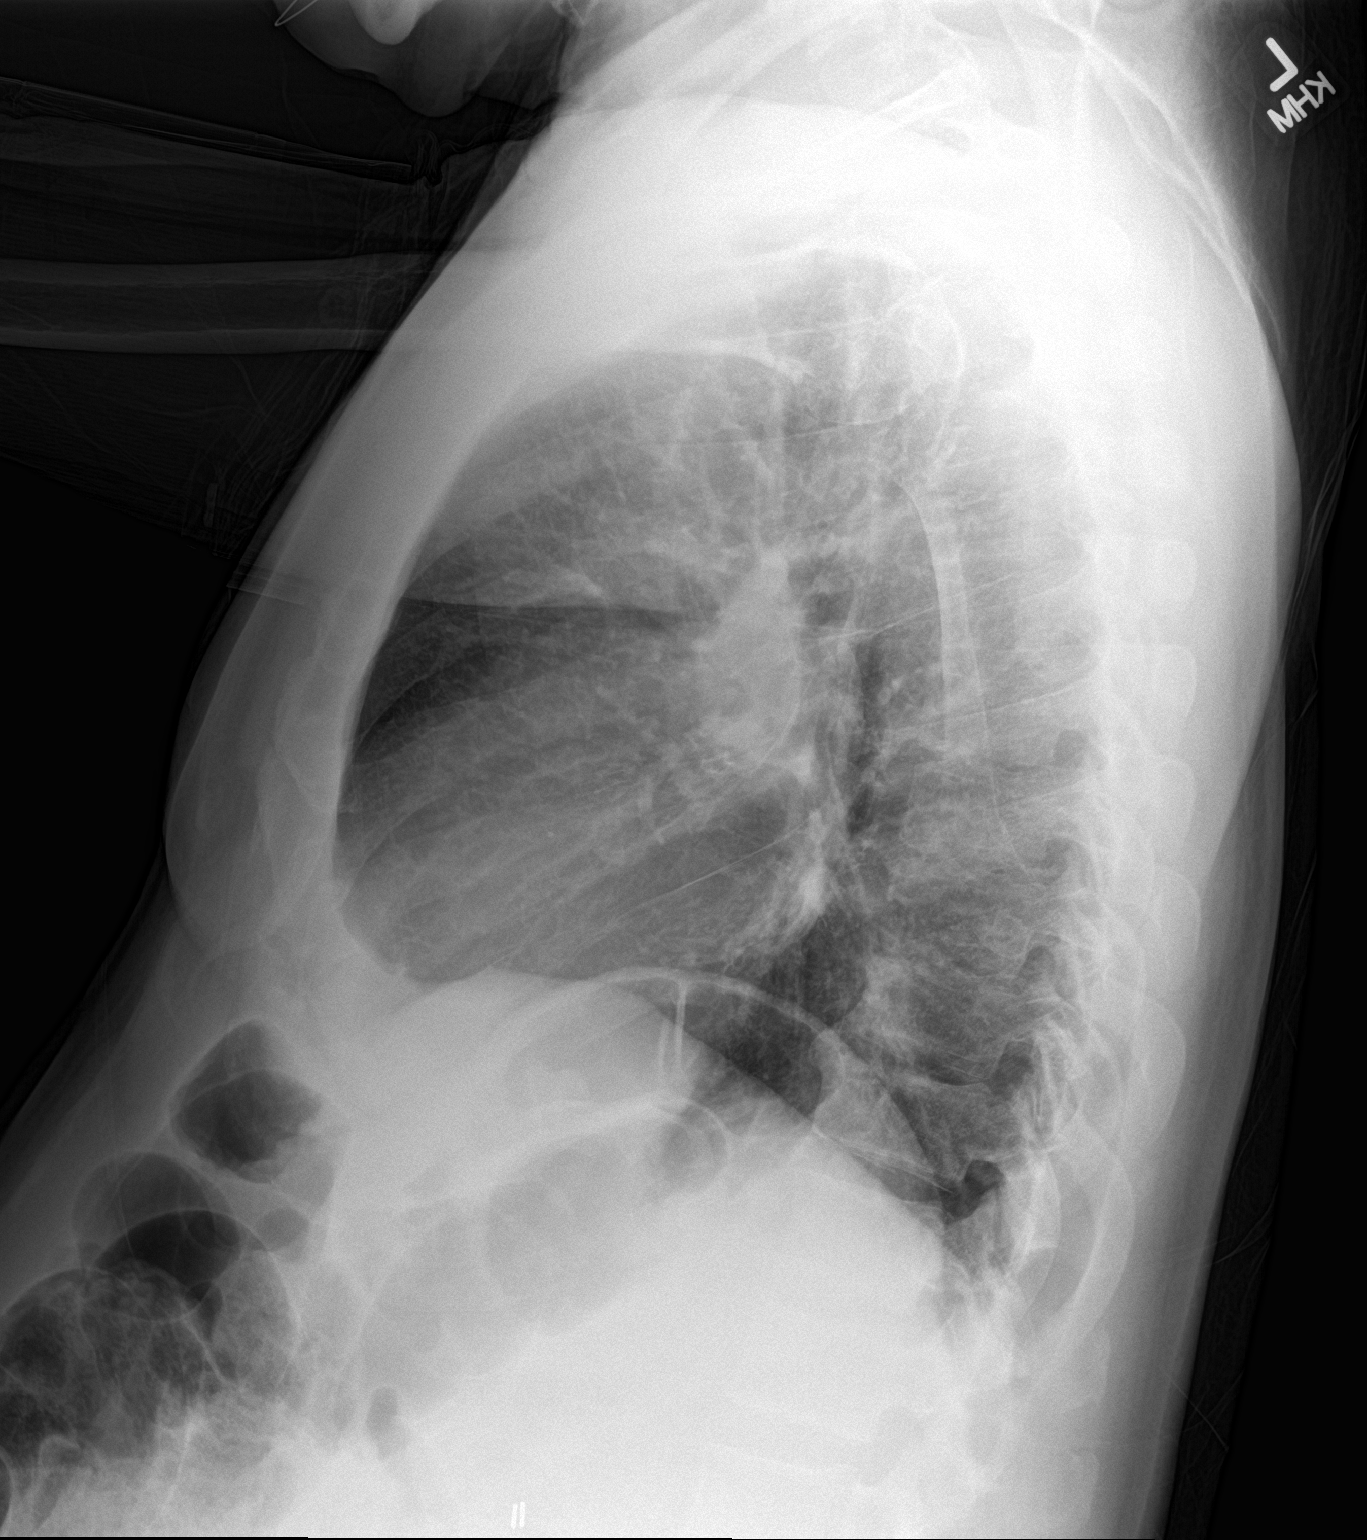

[2 of 2 positions shown; findings below may reference images not displayed]

FINDINGS: The lungs are well-aerated. Peribronchial thickening is noted. Mild
right midlung opacity may reflect mild pneumonia. There is no
evidence of pleural effusion or pneumothorax.

The heart is normal in size; the mediastinal contour is within
normal limits. No acute osseous abnormalities are seen.
IMPRESSION: Peribronchial thickening noted. Mild right midlung opacity may
reflect mild pneumonia.

## 2016-04-09 NOTE — ED Triage Notes (Signed)
Pt started having a cough last night, expiratory wheezing, and productive cough

## 2016-04-10 LAB — INFLUENZA PANEL BY PCR (TYPE A & B)
Influenza A By PCR: POSITIVE — AB
Influenza B By PCR: NEGATIVE

## 2016-04-10 MED ORDER — IPRATROPIUM-ALBUTEROL 0.5-2.5 (3) MG/3ML IN SOLN
3.0000 mL | Freq: Once | RESPIRATORY_TRACT | Status: AC
  Administered 2016-04-10: 3 mL via RESPIRATORY_TRACT
  Filled 2016-04-10: qty 3

## 2016-04-10 MED ORDER — LEVOFLOXACIN 500 MG PO TABS
500.0000 mg | ORAL_TABLET | Freq: Once | ORAL | Status: AC
  Administered 2016-04-10: 500 mg via ORAL
  Filled 2016-04-10: qty 1

## 2016-04-10 MED ORDER — ALBUTEROL SULFATE HFA 108 (90 BASE) MCG/ACT IN AERS: 2.0000 | INHALATION_SPRAY | RESPIRATORY_TRACT | 0 refills | Status: DC | PRN

## 2016-04-10 MED ORDER — ACETAMINOPHEN 500 MG PO TABS
1000.0000 mg | ORAL_TABLET | Freq: Once | ORAL | Status: AC
  Administered 2016-04-10: 1000 mg via ORAL
  Filled 2016-04-10: qty 2

## 2016-04-10 MED ORDER — LEVOFLOXACIN 500 MG PO TABS: 500.0000 mg | ORAL_TABLET | Freq: Every day | ORAL | 0 refills | Status: DC

## 2016-04-10 MED ORDER — PREDNISONE 20 MG PO TABS: 40.0000 mg | ORAL_TABLET | Freq: Every day | ORAL | 0 refills | Status: DC

## 2016-04-10 MED ORDER — PREDNISONE 50 MG PO TABS
60.0000 mg | ORAL_TABLET | Freq: Once | ORAL | Status: AC
  Administered 2016-04-10: 60 mg via ORAL
  Filled 2016-04-10: qty 1

## 2016-04-10 MED ORDER — OSELTAMIVIR PHOSPHATE 75 MG PO CAPS: 75.0000 mg | ORAL_CAPSULE | Freq: Two times a day (BID) | ORAL | 0 refills | Status: DC

## 2016-04-10 NOTE — ED Notes (Signed)
O2 was 94/95 while ambulating

## 2016-04-10 NOTE — ED Notes (Signed)
Pt states understanding of care given and follow up instructions.  Pt ambulated with steady gait from ED

## 2016-04-10 NOTE — ED Provider Notes (Signed)
[facility] DEPT Provider Note   CSN: [id number] Arrival date & time: 04/09/16  2138     History   Chief Complaint Chief Complaint  Patient presents with  . Cough    HPI Peter Lambert is a 71 y.o. male.  HPI  This is a 71 year old male with history of coronary artery disease, hypertension, hyperlipidemia, smoking who presents with shortness of breath and cough. Patient reports onset of upper respiratory symptoms yesterday. No known sick contacts. No documented fevers at home. Does report chills. Temperature in triage 101.6. Patient reports a nonproductive cough and congestion. Denies chest pain. Denies nausea, vomiting, diarrhea.    Past Medical History:  Diagnosis Date  . Coronary artery disease   . Hyperlipidemia   . Hypertension   . Hypertension   . MI, old   . Peripheral arterial disease ([place])   . Tobacco abuse   . Type 2 diabetes mellitus [facility])     Patient Active Problem List   Diagnosis Date Noted  . Tobacco use 07/17/2013  . Peripheral arterial disease ([place]) 12/03/2012  . Coronary artery disease 12/03/2012  . Essential hypertension 12/03/2012  . Hyperlipidemia 12/03/2012  . Type 2 diabetes mellitus ([place]) 12/03/2012    Past Surgical History:  Procedure Laterality Date  . CARDIAC SURGERY    . CORONARY STENT PLACEMENT         Home Medications    Prior to Admission medications   Medication Sig Start Date End Date Taking? Authorizing Provider  albuterol (PROVENTIL HFA;VENTOLIN HFA) 108 (90 Base) MCG/ACT inhaler Inhale 2 puffs into the lungs every 4 (four) hours as needed for wheezing or shortness of breath. 04/10/16   Merryl Hacker, MD  amLODipine (NORVASC) 10 MG tablet Take 1 tablet (10 mg total) by mouth daily. 04/04/16   Lorretta Harp, MD  aspirin 81 MG chewable tablet Chew 81 mg by mouth daily.      Historical Provider, MD  clopidogrel (PLAVIX) 75 MG tablet Take 1 tablet (75 mg total) by mouth once. Please keep upcoming appointment 01/28/15    Lorretta Harp, MD  hydrochlorothiazide (HYDRODIURIL) 25 MG tablet Take 25 mg by mouth daily.    Historical Provider, MD  levofloxacin (LEVAQUIN) 500 MG tablet Take 1 tablet (500 mg total) by mouth daily. 04/10/16   Merryl Hacker, MD  lisinopril (PRINIVIL,ZESTRIL) 20 MG tablet TAKE 1 TABLET (20 MG TOTAL) BY MOUTH 2 (TWO) TIMES DAILY. 09/29/14   Lorretta Harp, MD  metoprolol tartrate (LOPRESSOR) 25 MG tablet TAKE 1 TABLET (25 MG TOTAL) BY MOUTH 2 (TWO) TIMES DAILY. Patient taking differently: TAKE 1 TABLET (25 MG TOTAL) BY MOUTH ONCE DAILY. 08/24/14   Lorretta Harp, MD  nitroGLYCERIN (NITROSTAT) 0.4 MG SL tablet PLACE 1 TABLET UNDER THE TONGUE EVERY 5 MINUTES AS NEEDED FOR CHEST PAIN 12/26/15   Lorretta Harp, MD  oseltamivir (TAMIFLU) 75 MG capsule Take 1 capsule (75 mg total) by mouth every 12 (twelve) hours. 04/10/16   Merryl Hacker, MD  potassium chloride SA (KLOR-CON M20) 20 MEQ tablet Take 1 tablet (20 mEq total) by mouth daily. Please keep upcoming appointment. 03/08/16   Lorretta Harp, MD  predniSONE (DELTASONE) 20 MG tablet Take 2 tablets (40 mg total) by mouth daily. 04/10/16   Merryl Hacker, MD  simvastatin (ZOCOR) 40 MG tablet TAKE 1 TABLET (40 MG TOTAL) BY MOUTH DAILY AT 6 PM. 01/18/16   Lorretta Harp, MD  ZETIA 10 MG tablet TAKE 1  TABLET (10 MG TOTAL) BY MOUTH DAILY. KEEP UPCOMING APPOINTMENT 03/24/15   Lorretta Harp, MD    Family History Family History  Problem Relation Age of Onset  . Cancer Mother     Social History Social History  Substance Use Topics  . Smoking status: Current Every Day Smoker    Packs/day: 0.50    Years: 52.00    Types: Cigarettes  . Smokeless tobacco: Never Used  . Alcohol use No     Allergies   Patient has no known allergies.   Review of Systems Review of Systems  Constitutional: Positive for fever.  HENT: Positive for congestion.   Respiratory: Positive for cough and shortness of breath. Negative for chest  tightness.   Cardiovascular: Negative for chest pain and leg swelling.  Gastrointestinal: Negative for abdominal pain, diarrhea, nausea and vomiting.  All other systems reviewed and are negative.    Physical Exam Updated Vital Signs BP 139/58 (BP Location: Left Arm)   Pulse 117   Temp 101.6 F (38.7 C) (Oral)   Resp 18   Ht 5\' 8"  (1.727 m)   Wt 187 lb (84.8 kg)   SpO2 92%   BMI 28.43 kg/m   Physical Exam  Constitutional: He is oriented to person, place, and time.  Elderly, no acute distress  HENT:  Head: Normocephalic and atraumatic.  Cardiovascular: Regular rhythm and normal heart sounds.   No murmur heard. Tachycardia  Pulmonary/Chest: Effort normal. No respiratory distress. He has wheezes.  Good air movement, occasional expiratory wheeze, no rhonchi or rales  Abdominal: Soft. Bowel sounds are normal. There is no tenderness. There is no rebound.  Musculoskeletal: He exhibits no edema.  Neurological: He is alert and oriented to person, place, and time.  Skin: Skin is warm and dry.  Psychiatric: He has a normal mood and affect.  Nursing note and vitals reviewed.    ED Treatments / Results  Labs (all labs ordered are listed, but only abnormal results are displayed) Labs Reviewed  CBC WITH DIFFERENTIAL/PLATELET - Abnormal; Notable for the following:       Result Value   RBC 4.18 (*)    Hemoglobin 11.3 (*)    HCT 34.5 (*)    All other components within normal limits  COMPREHENSIVE METABOLIC PANEL - Abnormal; Notable for the following:    Glucose, Bld 122 (*)    Total Protein 8.5 (*)    All other components within normal limits  INFLUENZA PANEL BY PCR (TYPE A & B) - Abnormal; Notable for the following:    Influenza A By PCR POSITIVE (*)    All other components within normal limits  LACTIC ACID, PLASMA    EKG  EKG Interpretation None       Radiology Dg Chest 2 View  Result Date: 04/09/2016 CLINICAL DATA:  Acute onset of cough, fever and shortness of  breath. Initial encounter. EXAM: CHEST  2 VIEW COMPARISON:  Chest radiograph from 09/09/2010 FINDINGS: The lungs are well-aerated. Peribronchial thickening is noted. Mild right midlung opacity may reflect mild pneumonia. There is no evidence of pleural effusion or pneumothorax. The heart is normal in size; the mediastinal contour is within normal limits. No acute osseous abnormalities are seen. IMPRESSION: Peribronchial thickening noted. Mild right midlung opacity may reflect mild pneumonia. Electronically Signed   By: Garald Balding M.D.   On: 04/09/2016 22:12    Procedures Procedures (including critical care time)  Medications Ordered in ED Medications  predniSONE (DELTASONE) tablet 60 mg (60  mg Oral Given 04/10/16 0008)  acetaminophen (TYLENOL) tablet 1,000 mg (1,000 mg Oral Given 04/10/16 0008)  ipratropium-albuterol (DUONEB) 0.5-2.5 (3) MG/3ML nebulizer solution 3 mL (3 mLs Nebulization Given 04/10/16 0014)  levofloxacin (LEVAQUIN) tablet 500 mg (500 mg Oral Given 04/10/16 0008)     Initial Impression / Assessment and Plan / ED Course  I have reviewed the triage vital signs and the nursing notes.  Pertinent labs & imaging results that were available during my care of the patient were reviewed by me and considered in my medical decision making (see chart for details).     Patient presents with cough and chills. Initially tachycardic and febrile. However, generally nontoxic-appearing. He has some scant wheezing on exam with good air movement. Chest x-ray shows possible early pneumonia. Lab workup is largely unremarkable including lactate. Patient was given Levaquin. He was also given prednisone and a DuoNeb. On recheck he states that he feels much better. However, he has tested positive for the flu. Given his comorbidities and duration of illness, he is a candidate for Tamiflu.  Patient is requesting discharge home. He is generally well-appearing. I discussed with him that if he starts feeling  any worse or has increasing shortness of breath or worsening symptoms he needs to be reevaluated immediately given his risk factors for developing flu complications. Patient stated understanding. He was able to ambulate and maintain his pulse ox.  After history, exam, and medical workup I feel the patient has been appropriately medically screened and is safe for discharge home. Pertinent diagnoses were discussed with the patient. Patient was given return precautions.   Final Clinical Impressions(s) / ED Diagnoses   Final diagnoses:  Community acquired pneumonia, unspecified laterality  Influenza A    New Prescriptions New Prescriptions   ALBUTEROL (PROVENTIL HFA;VENTOLIN HFA) 108 (90 BASE) MCG/ACT INHALER    Inhale 2 puffs into the lungs every 4 (four) hours as needed for wheezing or shortness of breath.   LEVOFLOXACIN (LEVAQUIN) 500 MG TABLET    Take 1 tablet (500 mg total) by mouth daily.   OSELTAMIVIR (TAMIFLU) 75 MG CAPSULE    Take 1 capsule (75 mg total) by mouth every 12 (twelve) hours.   PREDNISONE (DELTASONE) 20 MG TABLET    Take 2 tablets (40 mg total) by mouth daily.     Merryl Hacker, MD 04/10/16 [phone]

## 2016-04-10 NOTE — Discharge Instructions (Signed)
You were seen today for shortness of breath. You were noted to have a fever. You tested positive for the flu and may have early pneumonia. It is very important that you take your antibiotics and medications provided. If he developed worsening shortness of breath, or any new or worsening symptoms you need to be reevaluated immediately.

## 2016-04-17 DIAGNOSIS — Z6828 Body mass index (BMI) 28.0-28.9, adult: Secondary | ICD-10-CM | POA: Diagnosis not present

## 2016-04-17 DIAGNOSIS — I10 Essential (primary) hypertension: Secondary | ICD-10-CM | POA: Diagnosis not present

## 2016-04-17 DIAGNOSIS — J449 Chronic obstructive pulmonary disease, unspecified: Secondary | ICD-10-CM | POA: Diagnosis not present

## 2016-04-17 DIAGNOSIS — E782 Mixed hyperlipidemia: Secondary | ICD-10-CM | POA: Diagnosis not present

## 2016-04-20 ENCOUNTER — Other Ambulatory Visit: Payer: Self-pay | Admitting: Cardiovascular Disease

## 2016-04-24 ENCOUNTER — Inpatient Hospital Stay (HOSPITAL_COMMUNITY): Admission: RE | Admit: 2016-04-24 | Payer: Medicare HMO | Source: Ambulatory Visit

## 2016-04-24 ENCOUNTER — Ambulatory Visit: Payer: Medicare HMO

## 2016-05-03 ENCOUNTER — Other Ambulatory Visit: Payer: Self-pay | Admitting: Cardiovascular Disease

## 2016-05-03 NOTE — Telephone Encounter (Signed)
Rx(s) sent to pharmacy electronically.  

## 2016-05-12 ENCOUNTER — Other Ambulatory Visit: Payer: Self-pay | Admitting: Cardiovascular Disease

## 2016-06-03 ENCOUNTER — Other Ambulatory Visit: Payer: Self-pay | Admitting: Cardiovascular Disease

## 2016-07-02 ENCOUNTER — Emergency Department (HOSPITAL_COMMUNITY): Payer: MEDICARE

## 2016-07-02 ENCOUNTER — Encounter (HOSPITAL_COMMUNITY): Payer: Self-pay

## 2016-07-02 ENCOUNTER — Inpatient Hospital Stay (HOSPITAL_COMMUNITY)
Admission: EM | Admit: 2016-07-02 | Discharge: 2016-07-03 | DRG: 190 | Disposition: A | Discharge status: Home / Self Care | Payer: MEDICARE | Attending: Internal Medicine | Admitting: Internal Medicine

## 2016-07-02 DIAGNOSIS — E1165 Type 2 diabetes mellitus with hyperglycemia: Secondary | ICD-10-CM | POA: Diagnosis present

## 2016-07-02 DIAGNOSIS — J441 Chronic obstructive pulmonary disease with (acute) exacerbation: Secondary | ICD-10-CM | POA: Diagnosis not present

## 2016-07-02 DIAGNOSIS — J449 Chronic obstructive pulmonary disease, unspecified: Secondary | ICD-10-CM | POA: Diagnosis not present

## 2016-07-02 DIAGNOSIS — E785 Hyperlipidemia, unspecified: Secondary | ICD-10-CM | POA: Diagnosis not present

## 2016-07-02 DIAGNOSIS — I739 Peripheral vascular disease, unspecified: Secondary | ICD-10-CM | POA: Diagnosis present

## 2016-07-02 DIAGNOSIS — E119 Type 2 diabetes mellitus without complications: Secondary | ICD-10-CM

## 2016-07-02 DIAGNOSIS — I2510 Atherosclerotic heart disease of native coronary artery without angina pectoris: Secondary | ICD-10-CM | POA: Diagnosis present

## 2016-07-02 DIAGNOSIS — J9601 Acute respiratory failure with hypoxia: Secondary | ICD-10-CM | POA: Diagnosis not present

## 2016-07-02 DIAGNOSIS — Z955 Presence of coronary angioplasty implant and graft: Secondary | ICD-10-CM | POA: Diagnosis not present

## 2016-07-02 DIAGNOSIS — I248 Other forms of acute ischemic heart disease: Secondary | ICD-10-CM | POA: Diagnosis not present

## 2016-07-02 DIAGNOSIS — R7989 Other specified abnormal findings of blood chemistry: Secondary | ICD-10-CM | POA: Diagnosis not present

## 2016-07-02 DIAGNOSIS — R69 Illness, unspecified: Secondary | ICD-10-CM | POA: Diagnosis not present

## 2016-07-02 DIAGNOSIS — R0602 Shortness of breath: Secondary | ICD-10-CM | POA: Diagnosis not present

## 2016-07-02 DIAGNOSIS — F17210 Nicotine dependence, cigarettes, uncomplicated: Secondary | ICD-10-CM | POA: Diagnosis not present

## 2016-07-02 DIAGNOSIS — R0603 Acute respiratory distress: Secondary | ICD-10-CM

## 2016-07-02 DIAGNOSIS — Z0000 Encounter for general adult medical examination without abnormal findings: Secondary | ICD-10-CM | POA: Diagnosis not present

## 2016-07-02 DIAGNOSIS — E1151 Type 2 diabetes mellitus with diabetic peripheral angiopathy without gangrene: Secondary | ICD-10-CM | POA: Diagnosis present

## 2016-07-02 DIAGNOSIS — Z1389 Encounter for screening for other disorder: Secondary | ICD-10-CM | POA: Diagnosis not present

## 2016-07-02 DIAGNOSIS — Z720 Tobacco use: Secondary | ICD-10-CM | POA: Diagnosis present

## 2016-07-02 DIAGNOSIS — Z6829 Body mass index (BMI) 29.0-29.9, adult: Secondary | ICD-10-CM | POA: Diagnosis not present

## 2016-07-02 DIAGNOSIS — I10 Essential (primary) hypertension: Secondary | ICD-10-CM | POA: Diagnosis not present

## 2016-07-02 LAB — COMPREHENSIVE METABOLIC PANEL
ALT: 35 U/L (ref 17–63)
AST: 29 U/L (ref 15–41)
Albumin: 4 g/dL (ref 3.5–5.0)
Alkaline Phosphatase: 62 U/L (ref 38–126)
Anion gap: 10 (ref 5–15)
BUN: 9 mg/dL (ref 6–20)
CO2: 28 mmol/L (ref 22–32)
Calcium: 9.3 mg/dL (ref 8.9–10.3)
Chloride: 102 mmol/L (ref 101–111)
Creatinine, Ser: 1.03 mg/dL (ref 0.61–1.24)
GFR calc Af Amer: >60 mL/min (ref >60)
GFR calc non Af Amer: >60 mL/min (ref >60)
Glucose, Bld: 179 mg/dL — ABNORMAL HIGH (ref 65–99)
Potassium: 3.5 mmol/L (ref 3.5–5.1)
Sodium: 140 mmol/L (ref 135–145)
Total Bilirubin: 0.5 mg/dL (ref 0.3–1.2)
Total Protein: 8.1 g/dL (ref 6.5–8.1)

## 2016-07-02 LAB — BLOOD GAS, ARTERIAL
Acid-Base Excess: 0.3 mmol/L (ref 0.0–2.0)
Bicarbonate: 24.2 mmol/L (ref 20.0–28.0)
Drawn by: 23534
O2 Content: 2 L/min
O2 Saturation: 95.2 %
Patient temperature: 37
pCO2 arterial: 49.6 mmHg — ABNORMAL HIGH (ref 32.0–48.0)
pH, Arterial: 7.331 — ABNORMAL LOW (ref 7.350–7.450)
pO2, Arterial: 88.4 mmHg (ref 83.0–108.0)

## 2016-07-02 LAB — CBC WITH DIFFERENTIAL/PLATELET
Basophils Absolute: 0.1 10*3/uL (ref 0.0–0.1)
Basophils Relative: 0 %
Eosinophils Absolute: 0.1 10*3/uL (ref 0.0–0.7)
Eosinophils Relative: 1 %
HCT: 35.3 % — ABNORMAL LOW (ref 39.0–52.0)
Hemoglobin: 11 g/dL — ABNORMAL LOW (ref 13.0–17.0)
Lymphocytes Relative: 9 %
Lymphs Abs: 1.2 10*3/uL (ref 0.7–4.0)
MCH: 26.4 pg (ref 26.0–34.0)
MCHC: 31.2 g/dL (ref 30.0–36.0)
MCV: 84.7 fL (ref 78.0–100.0)
Monocytes Absolute: 0.9 10*3/uL (ref 0.1–1.0)
Monocytes Relative: 7 %
Neutro Abs: 10.8 10*3/uL — ABNORMAL HIGH (ref 1.7–7.7)
Neutrophils Relative %: 83 %
Platelets: 337 10*3/uL (ref 150–400)
RBC: 4.17 MIL/uL — ABNORMAL LOW (ref 4.22–5.81)
RDW: 16.8 % — ABNORMAL HIGH (ref 11.5–15.5)
WBC: 12.9 10*3/uL — ABNORMAL HIGH (ref 4.0–10.5)

## 2016-07-02 LAB — I-STAT CG4 LACTIC ACID, ED: Lactic Acid, Venous: 3.12 mmol/L (ref 0.5–1.9)

## 2016-07-02 LAB — CBC
HCT: 31.8 % — ABNORMAL LOW (ref 39.0–52.0)
Hemoglobin: 10.1 g/dL — ABNORMAL LOW (ref 13.0–17.0)
MCH: 26.6 pg (ref 26.0–34.0)
MCHC: 31.8 g/dL (ref 30.0–36.0)
MCV: 83.9 fL (ref 78.0–100.0)
Platelets: 279 10*3/uL (ref 150–400)
RBC: 3.79 MIL/uL — ABNORMAL LOW (ref 4.22–5.81)
RDW: 16.8 % — ABNORMAL HIGH (ref 11.5–15.5)
WBC: 10.2 10*3/uL (ref 4.0–10.5)

## 2016-07-02 LAB — CREATININE, SERUM
Creatinine, Ser: 1.1 mg/dL (ref 0.61–1.24)
GFR calc Af Amer: >60 mL/min (ref >60)
GFR calc non Af Amer: >60 mL/min (ref >60)

## 2016-07-02 LAB — GLUCOSE, CAPILLARY: Glucose-Capillary: 194 mg/dL — ABNORMAL HIGH (ref 65–99)

## 2016-07-02 LAB — LACTIC ACID, PLASMA: Lactic Acid, Venous: 2.9 mmol/L (ref 0.5–1.9)

## 2016-07-02 LAB — TSH: TSH: 0.22 u[IU]/mL — ABNORMAL LOW (ref 0.350–4.500)

## 2016-07-02 LAB — PROTIME-INR
INR: 1
Prothrombin Time: 13.2 seconds (ref 11.4–15.2)

## 2016-07-02 LAB — TROPONIN I: Troponin I: 0.08 ng/mL (ref <0.03)

## 2016-07-02 LAB — BRAIN NATRIURETIC PEPTIDE: B Natriuretic Peptide: 289 pg/mL — ABNORMAL HIGH (ref 0.0–100.0)

## 2016-07-02 IMAGING — CR DG CHEST 1V PORT
1 series · 1 of 1 positions shown · non-contrast
Comparison: Radiographs [DATE].

CLINICAL DATA: Shortness of breath.

EXAM:
PORTABLE CHEST 1 VIEW

[ap]
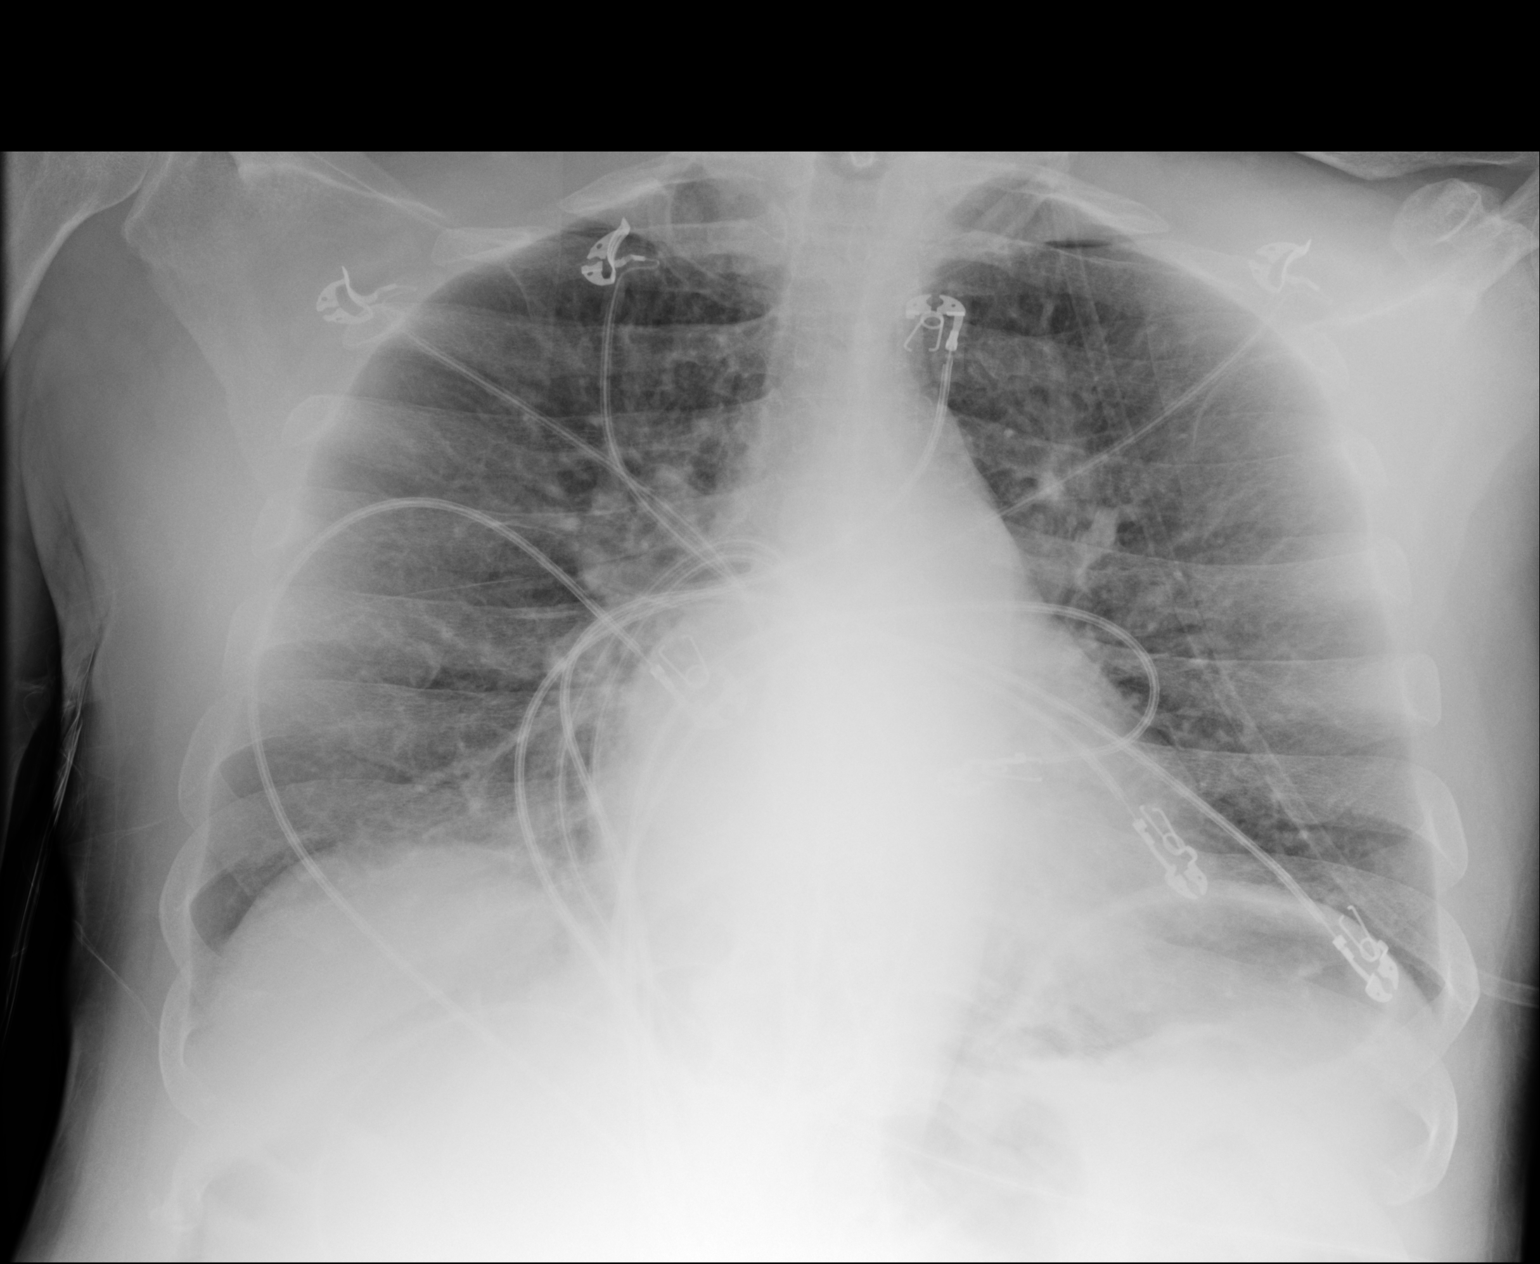

[1 of 1 positions shown; findings below may reference images not displayed]

FINDINGS: The heart size and mediastinal contours are within normal limits.
Mild central pulmonary vascular congestion is noted. No
consolidative process is noted. No pneumothorax or pleural effusion
is noted. The visualized skeletal structures are unremarkable.
IMPRESSION: Mild central pulmonary vascular congestion. No other abnormality
seen in the chest.

## 2016-07-02 MED ORDER — INSULIN ASPART 100 UNIT/ML ~~LOC~~ SOLN: 0.0000 [IU] | Freq: Every day | SUBCUTANEOUS | Status: DC

## 2016-07-02 MED ORDER — CLOPIDOGREL BISULFATE 75 MG PO TABS
75.0000 mg | ORAL_TABLET | Freq: Once | ORAL | Status: AC
  Administered 2016-07-02: 75 mg via ORAL
  Filled 2016-07-02: qty 1

## 2016-07-02 MED ORDER — TIOTROPIUM BROMIDE MONOHYDRATE 18 MCG IN CAPS
18.0000 ug | ORAL_CAPSULE | Freq: Every day | RESPIRATORY_TRACT | Status: DC
  Administered 2016-07-03: 18 ug via RESPIRATORY_TRACT
  Filled 2016-07-02: qty 5

## 2016-07-02 MED ORDER — POTASSIUM CHLORIDE CRYS ER 20 MEQ PO TBCR
20.0000 meq | EXTENDED_RELEASE_TABLET | Freq: Every day | ORAL | Status: DC
  Administered 2016-07-02 – 2016-07-03 (×2): 20 meq via ORAL
  Filled 2016-07-02 (×2): qty 1

## 2016-07-02 MED ORDER — LEVOFLOXACIN 500 MG PO TABS
500.0000 mg | ORAL_TABLET | Freq: Every day | ORAL | Status: DC
  Administered 2016-07-02 – 2016-07-03 (×2): 500 mg via ORAL
  Filled 2016-07-02 (×2): qty 1

## 2016-07-02 MED ORDER — HYDRALAZINE HCL 20 MG/ML IJ SOLN
10.0000 mg | INTRAMUSCULAR | Status: AC
  Administered 2016-07-02: 10 mg via INTRAVENOUS
  Filled 2016-07-02: qty 1

## 2016-07-02 MED ORDER — ALBUTEROL SULFATE (2.5 MG/3ML) 0.083% IN NEBU: 2.5000 mg | INHALATION_SOLUTION | RESPIRATORY_TRACT | Status: DC | PRN

## 2016-07-02 MED ORDER — AMLODIPINE BESYLATE 5 MG PO TABS
10.0000 mg | ORAL_TABLET | Freq: Every day | ORAL | Status: DC
  Administered 2016-07-03: 10 mg via ORAL
  Filled 2016-07-02: qty 2

## 2016-07-02 MED ORDER — SIMVASTATIN 20 MG PO TABS
40.0000 mg | ORAL_TABLET | Freq: Every day | ORAL | Status: DC
  Administered 2016-07-02: 40 mg via ORAL
  Filled 2016-07-02: qty 2

## 2016-07-02 MED ORDER — ENOXAPARIN SODIUM 40 MG/0.4ML ~~LOC~~ SOLN
40.0000 mg | SUBCUTANEOUS | Status: DC
  Administered 2016-07-02: 40 mg via SUBCUTANEOUS

## 2016-07-02 MED ORDER — SENNOSIDES-DOCUSATE SODIUM 8.6-50 MG PO TABS: 1.0000 | ORAL_TABLET | Freq: Every evening | ORAL | Status: DC | PRN

## 2016-07-02 MED ORDER — ASPIRIN 81 MG PO CHEW
81.0000 mg | CHEWABLE_TABLET | Freq: Every day | ORAL | Status: DC
  Administered 2016-07-02 – 2016-07-03 (×2): 81 mg via ORAL
  Filled 2016-07-02 (×2): qty 1

## 2016-07-02 MED ORDER — PNEUMOCOCCAL VAC POLYVALENT 25 MCG/0.5ML IJ INJ: 0.5000 mL | INJECTION | INTRAMUSCULAR | Status: DC

## 2016-07-02 MED ORDER — ACETAMINOPHEN 325 MG PO TABS: 650.0000 mg | ORAL_TABLET | Freq: Four times a day (QID) | ORAL | Status: DC | PRN

## 2016-07-02 MED ORDER — METHYLPREDNISOLONE SODIUM SUCC 125 MG IJ SOLR
125.0000 mg | Freq: Once | INTRAMUSCULAR | Status: AC
  Administered 2016-07-02: 125 mg via INTRAVENOUS
  Filled 2016-07-02: qty 2

## 2016-07-02 MED ORDER — NITROGLYCERIN 0.4 MG SL SUBL: 0.4000 mg | SUBLINGUAL_TABLET | SUBLINGUAL | Status: DC | PRN

## 2016-07-02 MED ORDER — ONDANSETRON HCL 4 MG PO TABS: 4.0000 mg | ORAL_TABLET | Freq: Four times a day (QID) | ORAL | Status: DC | PRN

## 2016-07-02 MED ORDER — ACETAMINOPHEN 650 MG RE SUPP: 650.0000 mg | Freq: Four times a day (QID) | RECTAL | Status: DC | PRN

## 2016-07-02 MED ORDER — INSULIN ASPART 100 UNIT/ML ~~LOC~~ SOLN
3.0000 [IU] | Freq: Three times a day (TID) | SUBCUTANEOUS | Status: DC
  Administered 2016-07-03 (×3): 3 [IU] via SUBCUTANEOUS

## 2016-07-02 MED ORDER — MOMETASONE FURO-FORMOTEROL FUM 100-5 MCG/ACT IN AERO
INHALATION_SPRAY | RESPIRATORY_TRACT | Status: AC
  Filled 2016-07-02: qty 8.8

## 2016-07-02 MED ORDER — SODIUM CHLORIDE 0.9 % IV SOLN: 250.0000 mL | INTRAVENOUS | Status: DC | PRN

## 2016-07-02 MED ORDER — EZETIMIBE 10 MG PO TABS
10.0000 mg | ORAL_TABLET | Freq: Every day | ORAL | Status: DC
  Administered 2016-07-02 – 2016-07-03 (×2): 10 mg via ORAL
  Filled 2016-07-02 (×2): qty 1

## 2016-07-02 MED ORDER — SODIUM CHLORIDE 0.9% FLUSH
3.0000 mL | Freq: Two times a day (BID) | INTRAVENOUS | Status: DC
  Administered 2016-07-03: 3 mL via INTRAVENOUS

## 2016-07-02 MED ORDER — SODIUM CHLORIDE 0.9% FLUSH: 3.0000 mL | INTRAVENOUS | Status: DC | PRN

## 2016-07-02 MED ORDER — METHYLPREDNISOLONE SODIUM SUCC 125 MG IJ SOLR
60.0000 mg | Freq: Four times a day (QID) | INTRAMUSCULAR | Status: DC
  Administered 2016-07-02 – 2016-07-03 (×4): 60 mg via INTRAVENOUS
  Filled 2016-07-02 (×3): qty 2

## 2016-07-02 MED ORDER — ALBUTEROL (5 MG/ML) CONTINUOUS INHALATION SOLN
10.0000 mg/h | INHALATION_SOLUTION | RESPIRATORY_TRACT | Status: DC
Start: 2016-07-02 — End: 2016-07-02
  Administered 2016-07-02: 10 mg/h via RESPIRATORY_TRACT
  Filled 2016-07-02 (×2): qty 20

## 2016-07-02 MED ORDER — MOMETASONE FURO-FORMOTEROL FUM 100-5 MCG/ACT IN AERO
2.0000 | INHALATION_SPRAY | Freq: Two times a day (BID) | RESPIRATORY_TRACT | Status: DC
  Administered 2016-07-02 – 2016-07-03 (×2): 2 via RESPIRATORY_TRACT
  Filled 2016-07-02: qty 8.8

## 2016-07-02 MED ORDER — INSULIN ASPART 100 UNIT/ML ~~LOC~~ SOLN
0.0000 [IU] | Freq: Three times a day (TID) | SUBCUTANEOUS | Status: DC
  Administered 2016-07-03: 2 [IU] via SUBCUTANEOUS
  Administered 2016-07-03 (×2): 1 [IU] via SUBCUTANEOUS

## 2016-07-02 MED ORDER — ONDANSETRON HCL 4 MG/2ML IJ SOLN: 4.0000 mg | Freq: Four times a day (QID) | INTRAMUSCULAR | Status: DC | PRN

## 2016-07-02 NOTE — ED Notes (Signed)
CRITICAL VALUE ALERT  Critical value received:  Lactic acid 3.12  Date of notification:  07/02/2016  Time of notification:  1100  Critical value read back:Yes.    Nurse who received alert:  [facility] RN  MD notified (1st page):  Dr. Sabra Heck  Time of first page:  1100  MD notified (2nd page):  Time of second page:  Responding MD:  Dr. Sabra Heck  Time MD responded: 1100

## 2016-07-02 NOTE — ED Triage Notes (Signed)
Pt reports sob, productive cough since Friday.  Pt went to pcp this morning and was sent to er for eval because pt was hypertensive, tachycardic, and o2 sat 90%.  Pt denies any pain.  Denies any swelling in feet and legs.

## 2016-07-02 NOTE — ED Notes (Signed)
Pt 95% on ra 2L Reading applied due to labored breathing and restlessness. edp aware

## 2016-07-02 NOTE — Progress Notes (Signed)
CRITICAL VALUE ALERT  Critical value received:  Lactic 2.9  Date of notification:  07/02/2016  Time of notification:  1600  Nurse who received alert:  Lysbeth Galas RN  MD notified (1st page):  Jerilee Hoh  Time of first page:  Petal, RN

## 2016-07-02 NOTE — ED Provider Notes (Addendum)
[facility] DEPT Provider Note   CSN: [id number] Arrival date & time: 07/02/16  1021  By signing my name below, I, Peter Lambert, attest that this documentation has been prepared under the direction and in the presence of Noemi Chapel, MD. Electronically Signed: Ethelle Lyon Lambert, Scribe. 07/02/2016. 11:37 AM.  History   Chief Complaint Chief Complaint  Patient presents with  . Shortness of Breath   The history is provided by the patient and medical records. No language interpreter was used.   HPI Comments:  Peter Lambert is a 71 y.o. male with a PMhx of MI, Peripheral Arterial Disease, CAD, HLD, DM-2, and HTN, who presents to the Emergency Department complaining of intermittent, gradually worsening, SOB onset three days ago. He states this SOB began and has worsened three days PTA. He notes having PNA and the flu in February and worries his symptoms today could be residual from the past illness though he states he was healthy prior to the onset of his SOB. Pt has associated symptoms of a productive cough since onset of his SOB and leg swelling. No h/o COPD. No recent surgery, travel, immobilizations, or injuries. Pt does not wear oxygen at home. He states he took his blood pressure medication this morning. Pt denies any other complaints at this time. Pt is a current most day smoker with a h/o tobacco abuse.  Cardiologist: Dr. Alvester Chou  Past Medical History:  Diagnosis Date  . Coronary artery disease   . Hyperlipidemia   . Hypertension   . Hypertension   . MI, old   . Peripheral arterial disease ([place])   . Tobacco abuse   . Type 2 diabetes mellitus [facility])    Patient Active Problem List   Diagnosis Date Noted  . Acute respiratory failure with hypoxemia ([place]) 07/02/2016  . Tobacco use 07/17/2013  . Peripheral arterial disease ([place]) 12/03/2012  . Coronary artery disease 12/03/2012  . Essential hypertension 12/03/2012  . Hyperlipidemia 12/03/2012  . Type 2 diabetes mellitus ([place])  12/03/2012   Past Surgical History:  Procedure Laterality Date  . CARDIAC SURGERY    . CORONARY STENT PLACEMENT      Home Medications    Prior to Admission medications   Medication Sig Start Date End Date Taking? Authorizing Provider  albuterol (PROVENTIL HFA;VENTOLIN HFA) 108 (90 Base) MCG/ACT inhaler Inhale 2 puffs into the lungs every 4 (four) hours as needed for wheezing or shortness of breath. 04/10/16  Yes Horton, Barbette Hair, MD  amLODipine (NORVASC) 10 MG tablet Take 1 tablet (10 mg total) by mouth daily. 04/04/16  Yes Lorretta Harp, MD  aspirin 81 MG chewable tablet Chew 81 mg by mouth daily.     Yes [provider]  Chlorpheniramine-Acetaminophen (CORICIDIN HBP COLD/FLU PO) Take 2 tablets by mouth daily as needed.   Yes [provider]  clopidogrel (PLAVIX) 75 MG tablet Take 1 tablet (75 mg total) by mouth once. Please keep upcoming appointment 01/28/15  Yes Lorretta Harp, MD  ezetimibe (ZETIA) 10 MG tablet Take 1 tablet (10 mg total) by mouth daily. 05/03/16  Yes Lorretta Harp, MD  KLOR-CON M20 20 MEQ tablet TAKE 1 TABLET (20 MEQ TOTAL) BY MOUTH DAILY. PLEASE KEEP UPCOMING APPOINTMENT. 04/20/16  Yes Lorretta Harp, MD  nitroGLYCERIN (NITROSTAT) 0.4 MG SL tablet PLACE 1 TABLET UNDER THE TONGUE EVERY 5 MINUTES AS NEEDED FOR CHEST PAIN 05/14/16  Yes Lorretta Harp, MD  simvastatin (ZOCOR) 40 MG tablet TAKE 1 TABLET (40  MG TOTAL) BY MOUTH DAILY AT 6 PM. 01/18/16  Yes Lorretta Harp, MD  clopidogrel (PLAVIX) 75 MG tablet TAKE 1 TABLET (75 MG TOTAL) BY MOUTH ONCE DAILY **PLEASE KEEP UPCOMING APPOINTMENT** Patient not taking: Reported on 07/02/2016 06/04/16   Lorretta Harp, MD  levofloxacin (LEVAQUIN) 500 MG tablet Take 1 tablet (500 mg total) by mouth daily. Patient not taking: Reported on 07/02/2016 04/10/16   Horton, Barbette Hair, MD  oseltamivir (TAMIFLU) 75 MG capsule Take 1 capsule (75 mg total) by mouth every 12 (twelve) hours. Patient not taking:  Reported on 07/02/2016 04/10/16   Horton, Barbette Hair, MD  predniSONE (DELTASONE) 20 MG tablet Take 2 tablets (40 mg total) by mouth daily. Patient not taking: Reported on 07/02/2016 04/10/16   Horton, Barbette Hair, MD    Family History Family History  Problem Relation Age of Onset  . Cancer Mother     Social History Social History  Substance Use Topics  . Smoking status: Current Every Day Smoker    Packs/day: 0.50    Years: 52.00    Types: Cigarettes  . Smokeless tobacco: Never Used  . Alcohol use No     Allergies   Patient has no known allergies.   Review of Systems Review of Systems  Respiratory: Positive for cough and shortness of breath.   Cardiovascular: Positive for leg swelling.  All other systems reviewed and are negative.   Physical Exam Updated Vital Signs BP (!) 165/84   Pulse (!) 114   Temp 98.3 F (36.8 C) (Oral)   Resp (!) 21   Ht 5\' 8"  (1.727 m)   Wt 192 lb (87.1 kg)   SpO2 100%   BMI 29.19 kg/m   Physical Exam  Constitutional: He is oriented to person, place, and time. He appears well-developed and well-nourished.  HENT:  Head: Normocephalic.  Eyes: Conjunctivae are normal.  Cardiovascular: Normal rate.   2+ pitting edema bilaterally symmetrical.  Pulmonary/Chest: Effort normal. He has wheezes.  Decreased air movement. Expiratory wheezing  Abdominal: He exhibits no distension.  Musculoskeletal: Normal range of motion.  Neurological: He is alert and oriented to person, place, and time.  Skin: Skin is warm and dry.  Psychiatric: He has a normal mood and affect.  Nursing note and vitals reviewed.  ED Treatments / Results  DIAGNOSTIC STUDIES:  Oxygen Saturation is 96% on [place] 2L, adequate by my interpretation.    COORDINATION OF CARE:  10:51 AM Discussed treatment plan with pt at bedside including CXR, blood work, breathing Tx, EKG, UA and pt agreed to plan.  12:44 PM Consult placed with Hospitalist.    Labs (all labs ordered are listed,  but only abnormal results are displayed) Labs Reviewed  COMPREHENSIVE METABOLIC PANEL - Abnormal; Notable for the following:       Result Value   Glucose, Bld 179 (*)    All other components within normal limits  CBC WITH DIFFERENTIAL/PLATELET - Abnormal; Notable for the following:    WBC 12.9 (*)    RBC 4.17 (*)    Hemoglobin 11.0 (*)    HCT 35.3 (*)    RDW 16.8 (*)    Neutro Abs 10.8 (*)    All other components within normal limits  BRAIN NATRIURETIC PEPTIDE - Abnormal; Notable for the following:    B Natriuretic Peptide 289.0 (*)    All other components within normal limits  BLOOD GAS, ARTERIAL - Abnormal; Notable for the following:    pH, Arterial 7.331 (*)  pCO2 arterial 49.6 (*)    All other components within normal limits  I-STAT CG4 LACTIC ACID, ED - Abnormal; Notable for the following:    Lactic Acid, Venous 3.12 (*)    All other components within normal limits  CULTURE, BLOOD (ROUTINE X 2)  CULTURE, BLOOD (ROUTINE X 2)  PROTIME-INR  URINALYSIS, ROUTINE W REFLEX MICROSCOPIC  I-STAT CG4 LACTIC ACID, ED    EKG  EKG Interpretation  Date/Time:  Monday Jul 02 2016 10:37:19 EDT Ventricular Rate:  126 PR Interval:    QRS Duration: 96 QT Interval:  309 QTC Calculation: 448 R Axis:   80 Text Interpretation:  Sinus tachycardia Ventricular premature complex Repol abnrm suggests ischemia, diffuse leads Baseline wander in lead(s) I III aVL V2 Since last tracing rate faster Confirmed by Sabra Heck  MD, Dylanie Quesenberry ([id number]) on 07/02/2016 10:47:31 AM       Radiology Dg Chest Portable 1 View  Result Date: 07/02/2016 CLINICAL DATA:  Shortness of breath. EXAM: PORTABLE CHEST 1 VIEW COMPARISON:  Radiographs of April 09, 2016. FINDINGS: The heart size and mediastinal contours are within normal limits. Mild central pulmonary vascular congestion is noted. No consolidative process is noted. No pneumothorax or pleural effusion is noted. The visualized skeletal structures are unremarkable.  IMPRESSION: Mild central pulmonary vascular congestion. No other abnormality seen in the chest. Electronically Signed   By: Marijo Conception, M.D.   On: 07/02/2016 11:33    Procedures Procedures (including critical care time)  Medications Ordered in ED Medications  albuterol (PROVENTIL,VENTOLIN) solution continuous neb (10 mg/hr Nebulization New Bag/Given 07/02/16 1147)  methylPREDNISolone sodium succinate (SOLU-MEDROL) 125 mg/2 mL injection 125 mg (125 mg Intravenous Given 07/02/16 1107)  hydrALAZINE (APRESOLINE) injection 10 mg (10 mg Intravenous Given 07/02/16 1148)     Initial Impression / Assessment and Plan / ED Course  I have reviewed the triage vital signs and the nursing notes.  Pertinent labs & imaging results that were available during my care of the patient were reviewed by me and considered in my medical decision making (see chart for details).     The patient presented with respiratory distress, elevated lactic acid, elevated white blood cell count  Chest x-ray was abnormal in that there was some vascular congestion but no overt pulmonary edema pneumothorax or abnormal mediastinum.  Blood cultures of been drawn, ABG shows some hypoxia but no significant hypercapnia, mild acidosis.  The patient is requiring BiPAP due to respiratory distress, he will be admitted to a high level of care. Discussed with the hospitalist at 1:00 PM, I appreciate their rapid consult and willingness to admit.  CRITICAL CARE Performed by: Johnna Acosta Total critical care time: 35 minutes Critical care time was exclusive of separately billable procedures and treating other patients. Critical care was necessary to treat or prevent imminent or life-threatening deterioration. Critical care was time spent personally by me on the following activities: development of treatment plan with patient and/or surrogate as well as nursing, discussions with consultants, evaluation of patient's response to treatment,  examination of patient, obtaining history from patient or surrogate, ordering and performing treatments and interventions, ordering and review of laboratory studies, ordering and review of radiographic studies, pulse oximetry and re-evaluation of patient's condition.   Final Clinical Impressions(s) / ED Diagnoses   Final diagnoses:  Respiratory distress  COPD exacerbation (HCC)    New Prescriptions New Prescriptions   No medications on file    I personally performed the services described in this documentation, which  was scribed in my presence. The recorded information has been reviewed and is accurate.        Noemi Chapel, MD 07/02/16 1306    Noemi Chapel, MD 07/02/16 [phone]

## 2016-07-02 NOTE — H&P (Signed)
History and Physical    Peter Lambert BPZ:[id number] DOB: [birth year] DOA: 07/02/2016  Referring MD/NP/PA: Noemi Chapel, EDP PCP: Redmond School, MD  Patient coming from: home  Chief Complaint: SOB  HPI: Peter Lambert is a 71 y.o. male with h/o CAD, HTN, HKD, DM and PAD as well as tobacco abuse who presents today with a 2 day h/o progressive SOB. Denies CP, has had some cough. Had flu and PNA and February and feels like he never recovered from it. Required BIPAP in the ED due to hypoxemia and increased work of breathing. Was also, per EDP report, very tight and wheezy. CXR without acute findings. Admission requested.  Past Medical/Surgical History: Past Medical History:  Diagnosis Date  . Coronary artery disease   . Hyperlipidemia   . Hypertension   . Hypertension   . MI, old   . Peripheral arterial disease ([place])   . Tobacco abuse   . Type 2 diabetes mellitus ([place])     Past Surgical History:  Procedure Laterality Date  . CARDIAC SURGERY    . CORONARY STENT PLACEMENT      Social History:  reports that he has been smoking Cigarettes.  He has a 26.00 pack-year smoking history. He has never used smokeless tobacco. He reports that he does not drink alcohol or use drugs.  Allergies: No Known Allergies  Family History:  Family History  Problem Relation Age of Onset  . Cancer Mother     Prior to Admission medications   Medication Sig Start Date End Date Taking? Authorizing Provider  albuterol (PROVENTIL HFA;VENTOLIN HFA) 108 (90 Base) MCG/ACT inhaler Inhale 2 puffs into the lungs every 4 (four) hours as needed for wheezing or shortness of breath. 04/10/16  Yes Horton, Barbette Hair, MD  amLODipine (NORVASC) 10 MG tablet Take 1 tablet (10 mg total) by mouth daily. 04/04/16  Yes Lorretta Harp, MD  aspirin 81 MG chewable tablet Chew 81 mg by mouth daily.     Yes [provider]  Chlorpheniramine-Acetaminophen (CORICIDIN HBP COLD/FLU PO) Take 2 tablets by mouth daily as  needed.   Yes [provider]  clopidogrel (PLAVIX) 75 MG tablet Take 1 tablet (75 mg total) by mouth once. Please keep upcoming appointment 01/28/15  Yes Lorretta Harp, MD  ezetimibe (ZETIA) 10 MG tablet Take 1 tablet (10 mg total) by mouth daily. 05/03/16  Yes Lorretta Harp, MD  KLOR-CON M20 20 MEQ tablet TAKE 1 TABLET (20 MEQ TOTAL) BY MOUTH DAILY. PLEASE KEEP UPCOMING APPOINTMENT. 04/20/16  Yes Lorretta Harp, MD  nitroGLYCERIN (NITROSTAT) 0.4 MG SL tablet PLACE 1 TABLET UNDER THE TONGUE EVERY 5 MINUTES AS NEEDED FOR CHEST PAIN 05/14/16  Yes Lorretta Harp, MD  simvastatin (ZOCOR) 40 MG tablet TAKE 1 TABLET (40 MG TOTAL) BY MOUTH DAILY AT 6 PM. 01/18/16  Yes Lorretta Harp, MD  clopidogrel (PLAVIX) 75 MG tablet TAKE 1 TABLET (75 MG TOTAL) BY MOUTH ONCE DAILY **PLEASE KEEP UPCOMING APPOINTMENT** Patient not taking: Reported on 07/02/2016 06/04/16   Lorretta Harp, MD  levofloxacin (LEVAQUIN) 500 MG tablet Take 1 tablet (500 mg total) by mouth daily. Patient not taking: Reported on 07/02/2016 04/10/16   Horton, Barbette Hair, MD  oseltamivir (TAMIFLU) 75 MG capsule Take 1 capsule (75 mg total) by mouth every 12 (twelve) hours. Patient not taking: Reported on 07/02/2016 04/10/16   Horton, Barbette Hair, MD  predniSONE (DELTASONE) 20 MG tablet Take 2 tablets (40 mg total) by mouth daily.  Patient not taking: Reported on 07/02/2016 04/10/16   Merryl Hacker, MD    Review of Systems:  Constitutional: Denies fever, chills, diaphoresis, appetite change and fatigue.  HEENT: Denies photophobia, eye pain, redness, hearing loss, ear pain, congestion, sore throat, rhinorrhea, sneezing, mouth sores, trouble swallowing, neck pain, neck stiffness and tinnitus.   Respiratory: Denies chest tightness Cardiovascular: Denies chest pain, palpitations and leg swelling.  Gastrointestinal: Denies nausea, vomiting, abdominal pain, diarrhea, constipation, blood in stool and abdominal distention.    Genitourinary: Denies dysuria, urgency, frequency, hematuria, flank pain and difficulty urinating.  Endocrine: Denies: hot or cold intolerance, sweats, changes in hair or nails, polyuria, polydipsia. Musculoskeletal: Denies myalgias, back pain, joint swelling, arthralgias and gait problem.  Skin: Denies pallor, rash and wound.  Neurological: Denies dizziness, seizures, syncope, weakness, light-headedness, numbness and headaches.  Hematological: Denies adenopathy. Easy bruising, personal or family bleeding history  Psychiatric/Behavioral: Denies suicidal ideation, mood changes, confusion, nervousness, sleep disturbance and agitation    Physical Exam: Vitals:   07/02/16 1530 07/02/16 1632 07/02/16 1700 07/02/16 1707  BP: (!) 152/85  (!) 153/83   Pulse: (!) 109 (!) 104 98   Resp: (!) 21 18 15    Temp:  98 F (36.7 C)    TempSrc:  Axillary    SpO2: 100% 100% 100% 100%  Weight:      Height:         Constitutional: NAD, calm, comfortable Eyes: PERRL, lids and conjunctivae normal ENMT: Mucous membranes are moist. Posterior pharynx clear of any exudate or lesions.Normal dentition.  Neck: normal, supple, no masses, no thyromegaly Respiratory: decreased breast sounds, mild expiratory wheezes. Cardiovascular: Regular rate and rhythm, no murmurs / rubs / gallops. No extremity edema. 2+ pedal pulses. No carotid bruits.  Abdomen: no tenderness, no masses palpated. No hepatosplenomegaly. Bowel sounds positive.  Musculoskeletal: no clubbing / cyanosis. No joint deformity upper and lower extremities. Good ROM, no contractures. Normal muscle tone.  Skin: no rashes, lesions, ulcers. No induration Neurologic: CN 2-12 grossly intact. Sensation intact, DTR normal. Strength 5/5 in all 4.  Psychiatric: Normal judgment and insight. Alert and oriented x 3. Normal mood.    Labs on Admission: I have personally reviewed the following labs and imaging studies  CBC:  Recent Labs Lab 07/02/16 1049  WBC  12.9*  NEUTROABS 10.8*  HGB 11.0*  HCT 35.3*  MCV 84.7  PLT 093   Basic Metabolic Panel:  Recent Labs Lab 07/02/16 1049  NA 140  K 3.5  CL 102  CO2 28  GLUCOSE 179*  BUN 9  CREATININE 1.03  CALCIUM 9.3   GFR: Estimated Creatinine Clearance: 68.7 mL/min (by C-G formula based on SCr of 1.03 mg/dL). Liver Function Tests:  Recent Labs Lab 07/02/16 1049  AST 29  ALT 35  ALKPHOS 62  BILITOT 0.5  PROT 8.1  ALBUMIN 4.0   No results for input(s): LIPASE, AMYLASE in the last 168 hours. No results for input(s): AMMONIA in the last 168 hours. Coagulation Profile:  Recent Labs Lab 07/02/16 1049  INR 1.00   Cardiac Enzymes: No results for input(s): CKTOTAL, CKMB, CKMBINDEX, TROPONINI in the last 168 hours. BNP (last 3 results) No results for input(s): PROBNP in the last 8760 hours. HbA1C: No results for input(s): HGBA1C in the last 72 hours. CBG: No results for input(s): GLUCAP in the last 168 hours. Lipid Profile: No results for input(s): CHOL, HDL, LDLCALC, TRIG, CHOLHDL, LDLDIRECT in the last 72 hours. Thyroid Function Tests: No results for  input(s): TSH, T4TOTAL, FREET4, T3FREE, THYROIDAB in the last 72 hours. Anemia Panel: No results for input(s): VITAMINB12, FOLATE, FERRITIN, TIBC, IRON, RETICCTPCT in the last 72 hours. Urine analysis: No results found for: COLORURINE, APPEARANCEUR, LABSPEC, PHURINE, GLUCOSEU, HGBUR, BILIRUBINUR, KETONESUR, PROTEINUR, UROBILINOGEN, NITRITE, LEUKOCYTESUR Sepsis Labs: @LABRCNTIP (procalcitonin:4,lacticidven:4) ) Recent Results (from the past 240 hour(s))  Culture, blood (Routine x 2)     Status: None (Preliminary result)   Collection Time: 07/02/16 10:45 AM  Result Value Ref Range Status   Specimen Description BLOOD LEFT ARM  Final   Special Requests   Final    BOTTLES DRAWN AEROBIC AND ANAEROBIC Blood Culture adequate volume   Culture PENDING  Incomplete   Report Status PENDING  Incomplete  Culture, blood (Routine x 2)      Status: None (Preliminary result)   Collection Time: 07/02/16 10:57 AM  Result Value Ref Range Status   Specimen Description BLOOD RIGHT HAND  Final   Special Requests   Final    BOTTLES DRAWN AEROBIC AND ANAEROBIC Blood Culture adequate volume   Culture PENDING  Incomplete   Report Status PENDING  Incomplete     Radiological Exams on Admission: Dg Chest Portable 1 View  Result Date: 07/02/2016 CLINICAL DATA:  Shortness of breath. EXAM: PORTABLE CHEST 1 VIEW COMPARISON:  Radiographs of April 09, 2016. FINDINGS: The heart size and mediastinal contours are within normal limits. Mild central pulmonary vascular congestion is noted. No consolidative process is noted. No pneumothorax or pleural effusion is noted. The visualized skeletal structures are unremarkable. IMPRESSION: Mild central pulmonary vascular congestion. No other abnormality seen in the chest. Electronically Signed   By: Marijo Conception, M.D.   On: 07/02/2016 11:33    EKG: Independently reviewed. Diffuse ST changes (old compared to prior)  Assessment/Plan Principal Problem:   Acute respiratory failure with hypoxemia (HCC) Active Problems:   COPD with acute exacerbation (HCC)   Peripheral arterial disease (HCC)   Coronary artery disease   Essential hypertension   Hyperlipidemia   Type 2 diabetes mellitus (HCC)   Tobacco use    Acute Hypoxemic Respiratory -Believed to to acute COPD. -Will try and wean of NIPPV tonight. -Cycle troponins. -Oxygen as required,  COPD with Acute Exacerbation -No prior diagnosis, but seems most likely explanation for his symptoms given his long-term smoking history. -Nebs/steroids/abx.  DM II -Check A1C. -Place on SSI.  Tobacco Abuse -Counseled on cessation. -Nicotine patch available if he requests it.  HTN -Fair control. -continue home medications  Hyperlipidemia -Continue statin.   DVT prophylaxis: lovenox  Code Status: full code  Family Communication: patient only    Disposition Plan: pending medical stability  Consults called: None  Admission status: inpatient    Time Spent: 85 minutes  Lelon Frohlich MD [facility] Pager [phone]  If 7PM-7AM, please contact night-coverage [email or web address] Password [email or web address]  07/02/2016, 5:35 PM

## 2016-07-03 ENCOUNTER — Inpatient Hospital Stay (HOSPITAL_COMMUNITY): Payer: MEDICARE

## 2016-07-03 DIAGNOSIS — J9601 Acute respiratory failure with hypoxia: Secondary | ICD-10-CM | POA: Diagnosis not present

## 2016-07-03 DIAGNOSIS — J441 Chronic obstructive pulmonary disease with (acute) exacerbation: Secondary | ICD-10-CM | POA: Diagnosis not present

## 2016-07-03 DIAGNOSIS — R7989 Other specified abnormal findings of blood chemistry: Secondary | ICD-10-CM

## 2016-07-03 DIAGNOSIS — R0603 Acute respiratory distress: Secondary | ICD-10-CM | POA: Diagnosis not present

## 2016-07-03 LAB — ECHOCARDIOGRAM COMPLETE
E decel time: 261 msec
FS: 21 % — AB (ref 28–44)
Height: 68 in
IVS/LV PW RATIO, ED: 1.05
LA ID, A-P, ES: 44 mm
LA diam end sys: 44 mm
LA diam index: 2.18 cm/m2
LA vol A4C: 88.5 ml
LA vol index: 42.1 mL/m2
LA vol: 85 mL
LV PW d: 13.2 mm — AB (ref 0.6–1.1)
LV dias vol index: 38 mL/m2
LV dias vol: 77 mL (ref 62–150)
LV sys vol index: 21 mL/m2
LV sys vol: 42 mL (ref 21–61)
LVOT SV: 50 mL
LVOT VTI: 15.8 cm
LVOT area: 3.14 cm2
LVOT diameter: 20 mm
LVOT peak grad rest: 2 mmHg
LVOT peak vel: 77.7 cm/s
MV Dec: 261
MV Peak grad: 3 mmHg
MV pk E vel: 93.4 m/s
RV sys press: 54 mmHg
Reg peak vel: 340 cm/s
Simpson's disk: 46
Stroke v: 35 ml
TAPSE: 17.5 mm
TR max vel: 340 cm/s
Weight: 2945.35 oz

## 2016-07-03 LAB — MRSA PCR SCREENING: MRSA by PCR: NEGATIVE

## 2016-07-03 LAB — CBC
HCT: 31.3 % — ABNORMAL LOW (ref 39.0–52.0)
Hemoglobin: 10 g/dL — ABNORMAL LOW (ref 13.0–17.0)
MCH: 26.8 pg (ref 26.0–34.0)
MCHC: 31.9 g/dL (ref 30.0–36.0)
MCV: 83.9 fL (ref 78.0–100.0)
Platelets: 276 10*3/uL (ref 150–400)
RBC: 3.73 MIL/uL — ABNORMAL LOW (ref 4.22–5.81)
RDW: 16.9 % — ABNORMAL HIGH (ref 11.5–15.5)
WBC: 12.2 10*3/uL — ABNORMAL HIGH (ref 4.0–10.5)

## 2016-07-03 LAB — TROPONIN I
Troponin I: 0.13 ng/mL (ref <0.03)
Troponin I: 0.15 ng/mL (ref <0.03)

## 2016-07-03 LAB — BASIC METABOLIC PANEL
Anion gap: 8 (ref 5–15)
BUN: 16 mg/dL (ref 6–20)
CO2: 28 mmol/L (ref 22–32)
Calcium: 8.9 mg/dL (ref 8.9–10.3)
Chloride: 102 mmol/L (ref 101–111)
Creatinine, Ser: 1.08 mg/dL (ref 0.61–1.24)
GFR calc Af Amer: >60 mL/min (ref >60)
GFR calc non Af Amer: >60 mL/min (ref >60)
Glucose, Bld: 165 mg/dL — ABNORMAL HIGH (ref 65–99)
Potassium: 4.4 mmol/L (ref 3.5–5.1)
Sodium: 138 mmol/L (ref 135–145)

## 2016-07-03 LAB — GLUCOSE, CAPILLARY
Glucose-Capillary: 139 mg/dL — ABNORMAL HIGH (ref 65–99)
Glucose-Capillary: 121 mg/dL — ABNORMAL HIGH (ref 65–99)
Glucose-Capillary: 157 mg/dL — ABNORMAL HIGH (ref 65–99)

## 2016-07-03 LAB — HEMOGLOBIN A1C
Hgb A1c MFr Bld: 7 % — ABNORMAL HIGH (ref 4.8–5.6)
Mean Plasma Glucose: 154 mg/dL

## 2016-07-03 MED ORDER — LEVOFLOXACIN 500 MG PO TABS: 500.0000 mg | ORAL_TABLET | Freq: Every day | ORAL | 0 refills | Status: DC

## 2016-07-03 MED ORDER — TIOTROPIUM BROMIDE MONOHYDRATE 18 MCG IN CAPS: 18.0000 ug | ORAL_CAPSULE | Freq: Every day | RESPIRATORY_TRACT | 3 refills | Status: DC

## 2016-07-03 MED ORDER — MOMETASONE FURO-FORMOTEROL FUM 100-5 MCG/ACT IN AERO: 2.0000 | INHALATION_SPRAY | Freq: Two times a day (BID) | RESPIRATORY_TRACT | 3 refills | Status: DC

## 2016-07-03 MED ORDER — PREDNISONE 10 MG PO TABS: 10.0000 mg | ORAL_TABLET | Freq: Every day | ORAL | 0 refills | Status: DC

## 2016-07-03 NOTE — Discharge Instructions (Signed)
Acute Respiratory Distress Syndrome, Adult °Acute respiratory distress syndrome is a life-threatening condition in which fluid collects in the lungs. This prevents the lungs from filling with air and passing oxygen into the blood. This can cause the lungs and other vital organs to fail. The condition usually develops following an infection, illness, surgery, or injury. °What are the causes? °This condition may be caused by: °· An infection, such as sepsis or pneumonia. °· A serious injury to the head or chest. °· Severe bleeding from an injury. °· A major surgery. °· Breathing in harmful chemicals or smoke. °· Blood transfusions. °· A blood clot in the lungs. °· Breathing in vomit (aspiration). °· Near-drowning. °· Inflammation of the pancreas (pancreatitis). °· A drug overdose. °What are the signs or symptoms? °Sudden shortness of breath and rapid breathing are the main symptoms of this condition. Other symptoms may include: °· A fast or irregular heartbeat. °· Skin, lips, or fingernails that look blue (cyanosis). °· Confusion. °· Tiredness or loss of energy. °· Chest pain, particularly while taking a breath. °· Coughing. °· Restlessness or anxiety. °· Fever. This is usually present if there is an underlying infection, such as pneumonia. °How is this diagnosed? °This condition is diagnosed based on: °· Your symptoms. °· Medical history. °· A physical exam. During the exam, your health care provider will listen to your heart and check for crackling or wheezing sounds in your lungs. °You may also have other tests to confirm the diagnosis and measure how well your lungs are working. These may include: °· Measuring the amount of oxygen in your blood. Your health care provider will use two methods to do this procedure: °¨ A small device (pulse oximeter) that is placed on your finger, earlobe, or toe. °¨ An arterial blood gas test. A sample of blood is taken from an artery and tested for oxygen levels. °· Blood  tests. °· Chest X-rays or CT scans to look for fluid in the lungs. °· Taking a sample of your sputum to test for infection. °· Heart test, such as an echocardiogram or electrocardiogram. This is done to rule out any heart problems (such as heart failure) that may be causing your symptoms. °· Bronchoscopy. During this test, a thin, flexible tube with a light is passed into the mouth or nose, down the windpipe, and into the lungs. °How is this treated? °Treatment depends on the cause of your condition. The goal is to support you while your lungs heal and the underlying cause is treated. Treatment may include: °· Oxygen therapy. This may be done through: °¨ A tube in your nose or a face mask. °¨ A ventilator. This device helps move air into and out of your lungs through a breathing tube that is inserted into your mouth or nose. °· Continuous positive airway pressure (CPAP). This treatment uses mild air pressure to keep the airways open. A mask or other device will be placed over your nose or mouth. °· Tracheostomy. During this procedure, a small cut is made in your neck to create an opening to your windpipe. A breathing tube is placed directly into your windpipe. The breathing tube is connected to a ventilator. This is done if you have problems with your airway or if you need a ventilator for a long period of time. °· Positioning you to lie on your stomach (prone position). °· Medicines, such as: °¨ Sedatives to help you relax. °¨ Blood pressure medicines. °¨ Antibiotics to treat infection. °¨ Blood thinners   to prevent blood clots. °¨ Diuretics to help prevent excess fluid. °· Fluids and nutrients given through an IV tube. °· Wearing compression stockings on your legs to prevent blood clots. °· Extra corporeal membrane oxygenation (ECMO). This treatment takes blood outside your body, adds oxygen, and removes carbon dioxide. The blood is then returned to your body. This treatment is only used in severe cases. °Follow  these instructions at home: °· Take over-the-counter and prescription medicines only as told by your health care provider. °· Do not use any products that contain nicotine or tobacco, such as cigarettes and e-cigarettes. If you need help quitting, ask your health care provider. °· Limit alcohol intake to no more than 1 drink per day for nonpregnant women and 2 drinks per day for men. One drink equals 12 oz of beer, 5 oz of wine, or 1½ oz of hard liquor. °· Ask friends and family to help you if daily activities make you tired. °· Attend any pulmonary rehabilitation as told by your health care provider. This may include: °¨ Education about your condition. °¨ Exercises. °¨ Breathing training. °¨ Counseling. °¨ Learning techniques to conserve energy. °¨ Nutrition counseling. °· Keep all follow-up visits as told by your health care provider. This is important. °Contact a health care provider if: °· You become short of breath during activity or while resting. °· You develop a cough that does not go away. °· You have a fever. °· Your symptoms do not get better or they get worse. °· You become anxious or depressed. °Get help right away if: °· You have sudden shortness of breath. °· You develop sudden chest pain that does not go away. °· You develop a rapid heart rate. °· You develop swelling or pain in one of your legs. °· You cough up blood. °· You have trouble breathing. °· Your skin, lips, or fingernails turn blue. °These symptoms may represent a serious problem that is an emergency. Do not wait to see if the symptoms will go away. Get medical help right away. Call your local emergency services (911 in the [place] Do not drive yourself to the hospital. °Summary °· Acute respiratory distress syndrome is a life-threatening condition in which fluid collects in the lungs, which leads the lungs and other vital organs to fail. °· This condition usually develops following an infection, illness, surgery, or injury. °· Sudden  shortness of breath and rapid breathing are the main symptoms of acute respiratory distress syndrome. °· Treatment may include oxygen therapy, continuous positive airway pressure (CPAP), tracheostomy, lying on your stomach (prone position), medicines, fluids and nutrients given through an IV tube, compression stockings, and extra corporeal membrane oxygenation (ECMO). °This information is not intended to replace advice given to you by your health care provider. Make sure you discuss any questions you have with your health care provider. °Document Released: 02/12/2005 Document Revised: 01/30/2016 Document Reviewed: 01/30/2016 °Elsevier Interactive Patient Education © 2017 [employer] ° °

## 2016-07-03 NOTE — Discharge Summary (Signed)
Physician Discharge Summary  Peter Lambert IRS:[id number] DOB: Feb 28, 1945 DOA: 07/02/2016  PCP: Redmond School, MD  Admit date: 07/02/2016 Discharge date: 07/03/2016  Time spent: 45 minutes  Recommendations for Outpatient Follow-up:  -Will be discharged home today. -Advised to follow up with PCP in 2 weeks.   Discharge Diagnoses:  Principal Problem:   Acute respiratory failure with hypoxemia (HCC) Active Problems:   COPD with acute exacerbation (HCC)   Peripheral arterial disease (HCC)   Coronary artery disease   Essential hypertension   Hyperlipidemia   Type 2 diabetes mellitus ([place])   Tobacco use   Discharge Condition: Improved  Filed Weights   07/02/16 1031 07/02/16 1505 07/03/16 0500  Weight: 87.1 kg (192 lb) 84.7 kg (186 lb 11.7 oz) 83.5 kg (184 lb 1.4 oz)    History of present illness:  Peter Lambert is a 71 y.o. male with h/o CAD, HTN, HKD, DM and PAD as well as tobacco abuse who presents today with a 2 day h/o progressive SOB. Denies CP, has had some cough. Had flu and PNA and February and feels like he never recovered from it. Required BIPAP in the ED due to hypoxemia and increased work of breathing. Was also, per EDP report, very tight and wheezy. CXR without acute findings. Admission requested.  Hospital Course:   Acute Hypoxemic Respiratory Failure -Believed due to COPD with acute exacerbation altho this has never been formally diagnosed. -Will DC home on a prednisone taper, levaquin x 5 days as well as symbicort and spiriva in addition to albuterol.  Elevated Troponins -Peaked at 0.15. -Believed 2/2 demand ischemia due to acute respiratory illness. -ECHO: EF 55-60% not technically sufficient to eval diastolic function. -I feel no further cardiac work up is necessary at this time.  DM II -Uncontrolled due to steroid use, should improve with steroid titration.  HLD -Continue statin  HTN -Well controlled. -Continue home  medications.   Procedures:  None   Consultations:  None  Discharge Instructions  Discharge Instructions    Diet - low sodium heart healthy    Complete by:  As directed    Increase activity slowly    Complete by:  As directed      Allergies as of 07/03/2016   No Known Allergies     Medication List    STOP taking these medications   oseltamivir 75 MG capsule Commonly known as:  TAMIFLU     TAKE these medications   albuterol 108 (90 Base) MCG/ACT inhaler Commonly known as:  PROVENTIL HFA;VENTOLIN HFA Inhale 2 puffs into the lungs every 4 (four) hours as needed for wheezing or shortness of breath.   amLODipine 10 MG tablet Commonly known as:  NORVASC Take 1 tablet (10 mg total) by mouth daily.   aspirin 81 MG chewable tablet Chew 81 mg by mouth daily.   clopidogrel 75 MG tablet Commonly known as:  PLAVIX Take 1 tablet (75 mg total) by mouth once. Please keep upcoming appointment What changed:  Another medication with the same name was removed. Continue taking this medication, and follow the directions you see here.   CORICIDIN HBP COLD/FLU PO Take 2 tablets by mouth daily as needed.   ezetimibe 10 MG tablet Commonly known as:  ZETIA Take 1 tablet (10 mg total) by mouth daily.   KLOR-CON M20 20 MEQ tablet Generic drug:  potassium chloride SA TAKE 1 TABLET (20 MEQ TOTAL) BY MOUTH DAILY. PLEASE KEEP UPCOMING APPOINTMENT.   levofloxacin 500 MG  tablet Commonly known as:  LEVAQUIN Take 1 tablet (500 mg total) by mouth daily. Start taking on:  07/04/2016   mometasone-formoterol 100-5 MCG/ACT Aero Commonly known as:  DULERA Inhale 2 puffs into the lungs 2 (two) times daily.   nitroGLYCERIN 0.4 MG SL tablet Commonly known as:  NITROSTAT PLACE 1 TABLET UNDER THE TONGUE EVERY 5 MINUTES AS NEEDED FOR CHEST PAIN   predniSONE 10 MG tablet Commonly known as:  DELTASONE Take 1 tablet (10 mg total) by mouth daily with breakfast. Take 6 tablets today and then decrease  by 1 tablet daily until none are left. What changed:  medication strength  how much to take  when to take this  additional instructions   simvastatin 40 MG tablet Commonly known as:  ZOCOR TAKE 1 TABLET (40 MG TOTAL) BY MOUTH DAILY AT 6 PM.   tiotropium 18 MCG inhalation capsule Commonly known as:  SPIRIVA Place 1 capsule (18 mcg total) into inhaler and inhale daily.      No Known Allergies Follow-up Information    Redmond School, MD. Schedule an appointment as soon as possible for a visit in 2 week(s).   Specialty:  Internal Medicine Contact information: [address] [place] [address] [phone]            The results of significant diagnostics from this hospitalization (including imaging, microbiology, ancillary and laboratory) are listed below for reference.    Significant Diagnostic Studies: Dg Chest Portable 1 View  Result Date: 07/02/2016 CLINICAL DATA:  Shortness of breath. EXAM: PORTABLE CHEST 1 VIEW COMPARISON:  Radiographs of April 09, 2016. FINDINGS: The heart size and mediastinal contours are within normal limits. Mild central pulmonary vascular congestion is noted. No consolidative process is noted. No pneumothorax or pleural effusion is noted. The visualized skeletal structures are unremarkable. IMPRESSION: Mild central pulmonary vascular congestion. No other abnormality seen in the chest. Electronically Signed   By: Marijo Conception, M.D.   On: 07/02/2016 11:33    Microbiology: Recent Results (from the past 240 hour(s))  Culture, blood (Routine x 2)     Status: None (Preliminary result)   Collection Time: 07/02/16 10:45 AM  Result Value Ref Range Status   Specimen Description BLOOD LEFT ARM  Final   Special Requests   Final    BOTTLES DRAWN AEROBIC AND ANAEROBIC Blood Culture adequate volume   Culture NO GROWTH 1 DAY  Final   Report Status PENDING  Incomplete  Culture, blood (Routine x 2)     Status: None (Preliminary result)    Collection Time: 07/02/16 10:57 AM  Result Value Ref Range Status   Specimen Description BLOOD RIGHT HAND  Final   Special Requests   Final    BOTTLES DRAWN AEROBIC AND ANAEROBIC Blood Culture adequate volume   Culture NO GROWTH 1 DAY  Final   Report Status PENDING  Incomplete  MRSA PCR Screening     Status: None   Collection Time: 07/02/16  3:08 PM  Result Value Ref Range Status   MRSA by PCR NEGATIVE NEGATIVE Final    Comment:        The GeneXpert MRSA Assay (FDA approved for NASAL specimens only), is one component of a comprehensive MRSA colonization surveillance program. It is not intended to diagnose MRSA infection nor to guide or monitor treatment for MRSA infections.      Labs: Basic Metabolic Panel:  Recent Labs Lab 07/02/16 1049 07/02/16 1740 07/03/16 0458  NA 140  --  138  K 3.5  --  4.4  CL 102  --  102  CO2 28  --  28  GLUCOSE 179*  --  165*  BUN 9  --  16  CREATININE 1.03 1.10 1.08  CALCIUM 9.3  --  8.9   Liver Function Tests:  Recent Labs Lab 07/02/16 1049  AST 29  ALT 35  ALKPHOS 62  BILITOT 0.5  PROT 8.1  ALBUMIN 4.0   No results for input(s): LIPASE, AMYLASE in the last 168 hours. No results for input(s): AMMONIA in the last 168 hours. CBC:  Recent Labs Lab 07/02/16 1049 07/02/16 1740 07/03/16 0458  WBC 12.9* 10.2 12.2*  NEUTROABS 10.8*  --   --   HGB 11.0* 10.1* 10.0*  HCT 35.3* 31.8* 31.3*  MCV 84.7 83.9 83.9  PLT 337 279 276   Cardiac Enzymes:  Recent Labs Lab 07/02/16 1740 07/02/16 2310 07/03/16 0458  TROPONINI 0.08* 0.13* 0.15*   BNP: BNP (last 3 results)  Recent Labs  07/02/16 1049  BNP 289.0*    ProBNP (last 3 results) No results for input(s): PROBNP in the last 8760 hours.  CBG:  Recent Labs Lab 07/02/16 2114 07/03/16 0743 07/03/16 1121 07/03/16 1647  GLUCAP 194* 139* 121* 157*       Signed:  HERNANDEZ ACOSTA,Quadry Kampa  [facility] Pager: [phone] 07/03/2016, 5:26 PM

## 2016-07-03 NOTE — Progress Notes (Signed)
*  PRELIMINARY RESULTS* Echocardiogram 2D Echocardiogram has been performed.  Peter Lambert 07/03/2016, 3:11 PM

## 2016-07-03 NOTE — Care Management Note (Signed)
Case Management Note  Patient Details  Name: Peter Lambert MRN: [id number] Date of Birth: 1946-02-05  Subjective/Objective:                  Pt admitted with resp failure. Pt discharging. Chart reviewed for CM needs. Pt is from home, lives with children and is ind wth ADl's. He has PCP, transportation and insurance with drug coverage. Pt will DC with new Rx for inhalers. He does not need oxygen. No CM consult placed.   Action/Plan: Discharging home with self care.   Expected Discharge Date:  07/03/16               Expected Discharge Plan:  Home/Self Care  In-House Referral:  NA  Discharge planning Services  CM Consult  Post Acute Care Choice:  NA Choice offered to:  NA  Status of Service:  Completed, signed off  Sherald Barge, RN 07/03/2016, 11:33 AM

## 2016-07-03 NOTE — Progress Notes (Signed)
RN ambulated patient. Patient maintained O2 sats of 97%-100% on room air. Patient ambulated approximately 300 feet.    Margaret Pyle, RN

## 2016-07-07 LAB — CULTURE, BLOOD (ROUTINE X 2)
Culture: NO GROWTH
Culture: NO GROWTH
Special Requests: ADEQUATE
Special Requests: ADEQUATE

## 2016-07-11 ENCOUNTER — Other Ambulatory Visit: Payer: Self-pay | Admitting: Cardiovascular Disease

## 2016-07-11 DIAGNOSIS — I2510 Atherosclerotic heart disease of native coronary artery without angina pectoris: Secondary | ICD-10-CM | POA: Diagnosis not present

## 2016-07-11 DIAGNOSIS — Z719 Counseling, unspecified: Secondary | ICD-10-CM | POA: Diagnosis not present

## 2016-07-11 DIAGNOSIS — E663 Overweight: Secondary | ICD-10-CM | POA: Diagnosis not present

## 2016-07-11 DIAGNOSIS — M1991 Primary osteoarthritis, unspecified site: Secondary | ICD-10-CM | POA: Diagnosis not present

## 2016-07-11 DIAGNOSIS — J441 Chronic obstructive pulmonary disease with (acute) exacerbation: Secondary | ICD-10-CM | POA: Diagnosis not present

## 2016-07-11 DIAGNOSIS — Z6829 Body mass index (BMI) 29.0-29.9, adult: Secondary | ICD-10-CM | POA: Diagnosis not present

## 2016-07-11 DIAGNOSIS — R69 Illness, unspecified: Secondary | ICD-10-CM | POA: Diagnosis not present

## 2016-07-11 DIAGNOSIS — J449 Chronic obstructive pulmonary disease, unspecified: Secondary | ICD-10-CM | POA: Diagnosis not present

## 2016-07-11 DIAGNOSIS — J9600 Acute respiratory failure, unspecified whether with hypoxia or hypercapnia: Secondary | ICD-10-CM | POA: Diagnosis not present

## 2016-07-11 DIAGNOSIS — I10 Essential (primary) hypertension: Secondary | ICD-10-CM | POA: Diagnosis not present

## 2016-07-11 DIAGNOSIS — E782 Mixed hyperlipidemia: Secondary | ICD-10-CM | POA: Diagnosis not present

## 2016-07-16 DIAGNOSIS — D649 Anemia, unspecified: Secondary | ICD-10-CM | POA: Diagnosis not present

## 2016-07-26 DIAGNOSIS — T461X5A Adverse effect of calcium-channel blockers, initial encounter: Secondary | ICD-10-CM | POA: Diagnosis not present

## 2016-07-26 DIAGNOSIS — Z6830 Body mass index (BMI) 30.0-30.9, adult: Secondary | ICD-10-CM | POA: Diagnosis not present

## 2016-07-26 DIAGNOSIS — M7989 Other specified soft tissue disorders: Secondary | ICD-10-CM | POA: Diagnosis not present

## 2016-10-12 ENCOUNTER — Other Ambulatory Visit: Payer: Self-pay | Admitting: Cardiovascular Disease

## 2016-11-05 DIAGNOSIS — R69 Illness, unspecified: Secondary | ICD-10-CM | POA: Diagnosis not present

## 2017-01-24 ENCOUNTER — Other Ambulatory Visit: Payer: Self-pay | Admitting: Cardiovascular Disease

## 2017-02-12 DIAGNOSIS — Z6829 Body mass index (BMI) 29.0-29.9, adult: Secondary | ICD-10-CM | POA: Diagnosis not present

## 2017-02-12 DIAGNOSIS — E663 Overweight: Secondary | ICD-10-CM | POA: Diagnosis not present

## 2017-02-12 DIAGNOSIS — R05 Cough: Secondary | ICD-10-CM | POA: Diagnosis not present

## 2017-03-05 DIAGNOSIS — Z1389 Encounter for screening for other disorder: Secondary | ICD-10-CM | POA: Diagnosis not present

## 2017-03-05 DIAGNOSIS — R05 Cough: Secondary | ICD-10-CM | POA: Diagnosis not present

## 2017-03-05 DIAGNOSIS — R0981 Nasal congestion: Secondary | ICD-10-CM | POA: Diagnosis not present

## 2017-03-05 DIAGNOSIS — Z6829 Body mass index (BMI) 29.0-29.9, adult: Secondary | ICD-10-CM | POA: Diagnosis not present

## 2017-03-05 DIAGNOSIS — J449 Chronic obstructive pulmonary disease, unspecified: Secondary | ICD-10-CM | POA: Diagnosis not present

## 2017-05-29 ENCOUNTER — Ambulatory Visit: Payer: PPO | Admitting: Cardiovascular Disease

## 2017-05-29 ENCOUNTER — Other Ambulatory Visit: Payer: Self-pay | Admitting: Cardiovascular Disease

## 2017-05-29 ENCOUNTER — Encounter: Payer: Self-pay | Admitting: Cardiovascular Disease

## 2017-05-29 VITALS — BP 172/81 | HR 84 | Ht 68.0 in | Wt 191.0 lb

## 2017-05-29 DIAGNOSIS — I2510 Atherosclerotic heart disease of native coronary artery without angina pectoris: Secondary | ICD-10-CM

## 2017-05-29 DIAGNOSIS — Z720 Tobacco use: Secondary | ICD-10-CM | POA: Diagnosis not present

## 2017-05-29 DIAGNOSIS — I739 Peripheral vascular disease, unspecified: Secondary | ICD-10-CM | POA: Diagnosis not present

## 2017-05-29 DIAGNOSIS — I10 Essential (primary) hypertension: Secondary | ICD-10-CM | POA: Diagnosis not present

## 2017-05-29 DIAGNOSIS — E7800 Pure hypercholesterolemia, unspecified: Secondary | ICD-10-CM

## 2017-05-29 MED ORDER — AMLODIPINE BESYLATE 10 MG PO TABS: 10.0000 mg | ORAL_TABLET | Freq: Every day | ORAL | 11 refills | Status: DC

## 2017-05-29 NOTE — Assessment & Plan Note (Signed)
History of peripheral arterial disease status post aortobifemoral bypass grafting which was done approximately 16 years ago with known occluded SFAs bilaterally. ABIs of 0.5 range He denies claudication.

## 2017-05-29 NOTE — Assessment & Plan Note (Signed)
History of hyperlipidemia on Zetia followed by PCP

## 2017-05-29 NOTE — Assessment & Plan Note (Signed)
History of ongoing tobacco abuse recalcitrant to risk factor modification. 

## 2017-05-29 NOTE — Assessment & Plan Note (Addendum)
History of CAD status post non-ST segment elevation myocardial infarctionApril 2010. I catheterized him revealing a high-grade proximal LAD stenosis which I stented using a 2.75 mm x 18 mm long Xience drug-eluting stent. I did feel a first diagonal branch and performed angioplasty of the ostium. He had dominant circumflex with 80% distal AV groove stenosis and nondominant RCA with normal LV function. He denies chest pain or shortness of breath.

## 2017-05-29 NOTE — Progress Notes (Signed)
05/29/2017 Peter Lambert   1946-01-31  [id number]  Primary Physician Redmond School, MD Primary Cardiologist: Lorretta Harp MD FACP, [place], [place], [place]  HPI:  Peter Lambert is a 72 y.o.  mildly overweight divorced African American male, father of 2, grandfather to 4 grandchildren, who I last saw in the office 04/04/16 . He had a non-ST-segment-elevation myocardial infarction in April 2010. I catheterized him revealing high-grade proximal LAD stenosis, which I stented using a 2.75 x 18 mm long Xience V drug-eluting stent. I did jail the first diagonal branch, performed angioplasty at the ostium, reducing an 80% stenosis to less than 40%. He had a dominant circumflex system with an 80% distal AV groove circumflex as well as a nondominant RCA. He had normal LV function. Abdominal aortogram revealed a patent aortobifemoral bypass graft, which was placed approximately 16 years ago. He has known occluded SFAs bilaterally with ABIs that run in the 0.5 or 0.6 range. He denies chest pain, shortness of breath, or claudication. He continues to smoke 3 packs of cigarettes per week despite counseling to the contrary. He did stop smoking for a while and then restarted back and a third pack a day.     Current Meds  Medication Sig  . albuterol (PROVENTIL HFA;VENTOLIN HFA) 108 (90 Base) MCG/ACT inhaler Inhale 2 puffs into the lungs every 4 (four) hours as needed for wheezing or shortness of breath.  Marland Kitchen amLODipine (NORVASC) 10 MG tablet TAKE 1 TABLET (10 MG TOTAL) BY MOUTH DAILY.  Marland Kitchen aspirin 81 MG chewable tablet Chew 81 mg by mouth daily.    . clopidogrel (PLAVIX) 75 MG tablet Take 1 tablet (75 mg total) by mouth once. Please keep upcoming appointment  . doxazosin (CARDURA) 2 MG tablet Take 2 mg by mouth daily.  Marland Kitchen ezetimibe (ZETIA) 10 MG tablet Take 1 tablet (10 mg total) by mouth daily.  . ferrous sulfate 325 (65 FE) MG tablet Take 325 mg by mouth 3 (three) times daily.  Marland Kitchen KLOR-CON M20 20 MEQ tablet  TAKE 1 TABLET (20 MEQ TOTAL) BY MOUTH DAILY. PLEASE KEEP UPCOMING APPOINTMENT.  Marland Kitchen nitroGLYCERIN (NITROSTAT) 0.4 MG SL tablet PLACE 1 TABLET UNDER THE TONGUE EVERY 5 MINUTES AS NEEDED FOR CHEST PAIN  . [DISCONTINUED] Chlorpheniramine-Acetaminophen (CORICIDIN HBP COLD/FLU PO) Take 2 tablets by mouth daily as needed.  . [DISCONTINUED] levofloxacin (LEVAQUIN) 500 MG tablet Take 1 tablet (500 mg total) by mouth daily.  . [DISCONTINUED] mometasone-formoterol (DULERA) 100-5 MCG/ACT AERO Inhale 2 puffs into the lungs 2 (two) times daily.  . [DISCONTINUED] predniSONE (DELTASONE) 10 MG tablet Take 1 tablet (10 mg total) by mouth daily with breakfast. Take 6 tablets today and then decrease by 1 tablet daily until none are left.  . [DISCONTINUED] simvastatin (ZOCOR) 40 MG tablet TAKE 1 TABLET (40 MG TOTAL) BY MOUTH DAILY AT 6 PM.  . [DISCONTINUED] simvastatin (ZOCOR) 40 MG tablet Take 1 tablet (40 mg total) by mouth daily at 6 PM.  . [DISCONTINUED] tiotropium (SPIRIVA) 18 MCG inhalation capsule Place 1 capsule (18 mcg total) into inhaler and inhale daily.     No Known Allergies  Social History   Socioeconomic History  . Marital status: Divorced    Spouse name: Not on file  . Number of children: Not on file  . Years of education: Not on file  . Highest education level: Not on file  Occupational History  . Not on file  Social Needs  . Financial resource strain: Not  on file  . Food insecurity:    Worry: Not on file    Inability: Not on file  . Transportation needs:    Medical: Not on file    Non-medical: Not on file  Tobacco Use  . Smoking status: Current Every Day Smoker    Packs/day: 0.50    Years: 52.00    Pack years: 26.00    Types: Cigarettes  . Smokeless tobacco: Never Used  Substance and Sexual Activity  . Alcohol use: No  . Drug use: No  . Sexual activity: Never  Lifestyle  . Physical activity:    Days per week: Not on file    Minutes per session: Not on file  . Stress: Not on  file  Relationships  . Social connections:    Talks on phone: Not on file    Gets together: Not on file    Attends religious service: Not on file    Active member of club or organization: Not on file    Attends meetings of clubs or organizations: Not on file    Relationship status: Not on file  . Intimate partner violence:    Fear of current or ex partner: Not on file    Emotionally abused: Not on file    Physically abused: Not on file    Forced sexual activity: Not on file  Other Topics Concern  . Not on file  Social History Narrative  . Not on file     Review of Systems: General: negative for chills, fever, night sweats or weight changes.  Cardiovascular: negative for chest pain, dyspnea on exertion, edema, orthopnea, palpitations, paroxysmal nocturnal dyspnea or shortness of breath Dermatological: negative for rash Respiratory: negative for cough or wheezing Urologic: negative for hematuria Abdominal: negative for nausea, vomiting, diarrhea, bright red blood per rectum, melena, or hematemesis Neurologic: negative for visual changes, syncope, or dizziness All other systems reviewed and are otherwise negative except as noted above.    Blood pressure (!) 172/81, pulse 84, height 5\' 8"  (1.727 m), weight 191 lb (86.6 kg).  General appearance: alert and no distress Neck: no adenopathy, no JVD, supple, symmetrical, trachea midline, thyroid not enlarged, symmetric, no tenderness/mass/nodules and soft bilateral carotid bruits Lungs: clear to auscultation bilaterally Heart: regular rate and rhythm, S1, S2 normal, no murmur, click, rub or gallop Extremities: extremities normal, atraumatic, no cyanosis or edema Pulses: diminished pedal pulses bilaterally Skin: Skin color, texture, turgor normal. No rashes or lesions Neurologic: Alert and oriented X 3, normal strength and tone. Normal symmetric reflexes. Normal coordination and gait  EKG sinus rhythm at 82 with nonspecific ST and  T-wave changes. I personally reviewed this EKG.  ASSESSMENT AND PLAN:   Peripheral arterial disease ([place]) History of peripheral arterial disease status post aortobifemoral bypass grafting which was done approximately 16 years ago with known occluded SFAs bilaterally. ABIs of 0.5 range He denies claudication.  Coronary artery disease History of CAD status post non-ST segment elevation myocardial infarctionApril 2010. I catheterized him revealing a high-grade proximal LAD stenosis which I stented using a 2.75 mm x 18 mm long Xience drug-eluting stent. I did feel a first diagonal branch and performed angioplasty of the ostium. He had dominant circumflex with 80% distal AV groove stenosis and nondominant RCA with normal LV function. He denies chest pain or shortness of breath.  Essential hypertension History of essential hypertension blood pressure measured at 172/81. He was on lisinopril metoprolol last year however these appear to have been discontinued by  his PCP. He is currently on amlodipine and doxazosin although he said he ran out of his blood pressure meds a week ago and has not taken them.  Hyperlipidemia History of hyperlipidemia on Zetia followed by PCP  Tobacco use History of ongoing tobacco abuse recalcitrant to risk factor modification.      Lorretta Harp MD FACP,FACC,FAHA, [facility] 05/29/2017 1:52 PM

## 2017-05-29 NOTE — Patient Instructions (Signed)

## 2017-05-29 NOTE — Assessment & Plan Note (Signed)
History of essential hypertension blood pressure measured at 172/81. He was on lisinopril metoprolol last year however these appear to have been discontinued by his PCP. He is currently on amlodipine and doxazosin although he said he ran out of his blood pressure meds a week ago and has not taken them.

## 2017-06-28 DIAGNOSIS — Z1389 Encounter for screening for other disorder: Secondary | ICD-10-CM | POA: Diagnosis not present

## 2017-06-28 DIAGNOSIS — I10 Essential (primary) hypertension: Secondary | ICD-10-CM | POA: Diagnosis not present

## 2017-06-28 DIAGNOSIS — E663 Overweight: Secondary | ICD-10-CM | POA: Diagnosis not present

## 2017-06-28 DIAGNOSIS — N182 Chronic kidney disease, stage 2 (mild): Secondary | ICD-10-CM | POA: Diagnosis not present

## 2017-06-28 DIAGNOSIS — L209 Atopic dermatitis, unspecified: Secondary | ICD-10-CM | POA: Diagnosis not present

## 2017-06-28 DIAGNOSIS — Z6828 Body mass index (BMI) 28.0-28.9, adult: Secondary | ICD-10-CM | POA: Diagnosis not present

## 2017-06-28 DIAGNOSIS — L309 Dermatitis, unspecified: Secondary | ICD-10-CM | POA: Diagnosis not present

## 2017-06-28 DIAGNOSIS — M1991 Primary osteoarthritis, unspecified site: Secondary | ICD-10-CM | POA: Diagnosis not present

## 2017-06-28 DIAGNOSIS — E1129 Type 2 diabetes mellitus with other diabetic kidney complication: Secondary | ICD-10-CM | POA: Diagnosis not present

## 2017-07-03 ENCOUNTER — Other Ambulatory Visit: Payer: Self-pay | Admitting: Cardiovascular Disease

## 2017-07-03 NOTE — Telephone Encounter (Signed)
Rx(s) sent to pharmacy electronically.  

## 2017-09-04 ENCOUNTER — Other Ambulatory Visit: Payer: Self-pay | Admitting: Cardiovascular Disease

## 2017-11-06 DIAGNOSIS — Z23 Encounter for immunization: Secondary | ICD-10-CM | POA: Diagnosis not present

## 2017-12-04 ENCOUNTER — Other Ambulatory Visit: Payer: Self-pay | Admitting: Cardiovascular Disease

## 2018-01-30 DIAGNOSIS — Z6828 Body mass index (BMI) 28.0-28.9, adult: Secondary | ICD-10-CM | POA: Diagnosis not present

## 2018-01-30 DIAGNOSIS — Z1389 Encounter for screening for other disorder: Secondary | ICD-10-CM | POA: Diagnosis not present

## 2018-01-30 DIAGNOSIS — F17290 Nicotine dependence, other tobacco product, uncomplicated: Secondary | ICD-10-CM | POA: Diagnosis not present

## 2018-01-30 DIAGNOSIS — I10 Essential (primary) hypertension: Secondary | ICD-10-CM | POA: Diagnosis not present

## 2018-01-30 DIAGNOSIS — J449 Chronic obstructive pulmonary disease, unspecified: Secondary | ICD-10-CM | POA: Diagnosis not present

## 2018-01-30 DIAGNOSIS — J069 Acute upper respiratory infection, unspecified: Secondary | ICD-10-CM | POA: Diagnosis not present

## 2018-01-30 DIAGNOSIS — E663 Overweight: Secondary | ICD-10-CM | POA: Diagnosis not present

## 2018-02-11 ENCOUNTER — Other Ambulatory Visit: Payer: Self-pay | Admitting: Cardiovascular Disease

## 2018-02-13 NOTE — Telephone Encounter (Signed)
Rx request sent to pharmacy.  

## 2018-02-27 ENCOUNTER — Emergency Department (HOSPITAL_COMMUNITY): Payer: PPO

## 2018-02-27 ENCOUNTER — Encounter (HOSPITAL_COMMUNITY): Payer: Self-pay | Admitting: Emergency Medicine

## 2018-02-27 ENCOUNTER — Other Ambulatory Visit: Payer: Self-pay

## 2018-02-27 ENCOUNTER — Emergency Department (HOSPITAL_COMMUNITY)
Admission: EM | Admit: 2018-02-27 | Discharge: 2018-02-27 | Disposition: A | Discharge status: Home / Self Care | Payer: PPO | Attending: Emergency Medicine | Admitting: Emergency Medicine

## 2018-02-27 DIAGNOSIS — I2510 Atherosclerotic heart disease of native coronary artery without angina pectoris: Secondary | ICD-10-CM | POA: Insufficient documentation

## 2018-02-27 DIAGNOSIS — F17210 Nicotine dependence, cigarettes, uncomplicated: Secondary | ICD-10-CM | POA: Diagnosis not present

## 2018-02-27 DIAGNOSIS — M79671 Pain in right foot: Secondary | ICD-10-CM | POA: Insufficient documentation

## 2018-02-27 DIAGNOSIS — I10 Essential (primary) hypertension: Secondary | ICD-10-CM | POA: Insufficient documentation

## 2018-02-27 DIAGNOSIS — Z7902 Long term (current) use of antithrombotics/antiplatelets: Secondary | ICD-10-CM | POA: Diagnosis not present

## 2018-02-27 DIAGNOSIS — Z79899 Other long term (current) drug therapy: Secondary | ICD-10-CM | POA: Insufficient documentation

## 2018-02-27 DIAGNOSIS — M109 Gout, unspecified: Secondary | ICD-10-CM

## 2018-02-27 DIAGNOSIS — E119 Type 2 diabetes mellitus without complications: Secondary | ICD-10-CM | POA: Diagnosis not present

## 2018-02-27 IMAGING — DX DG FOOT COMPLETE 3+V*R*
3 series · 3 of 3 positions shown · non-contrast
Comparison: None.

CLINICAL DATA: Right foot pain and history of gout

EXAM:
RIGHT FOOT COMPLETE - 3+ VIEW

[foot ap]
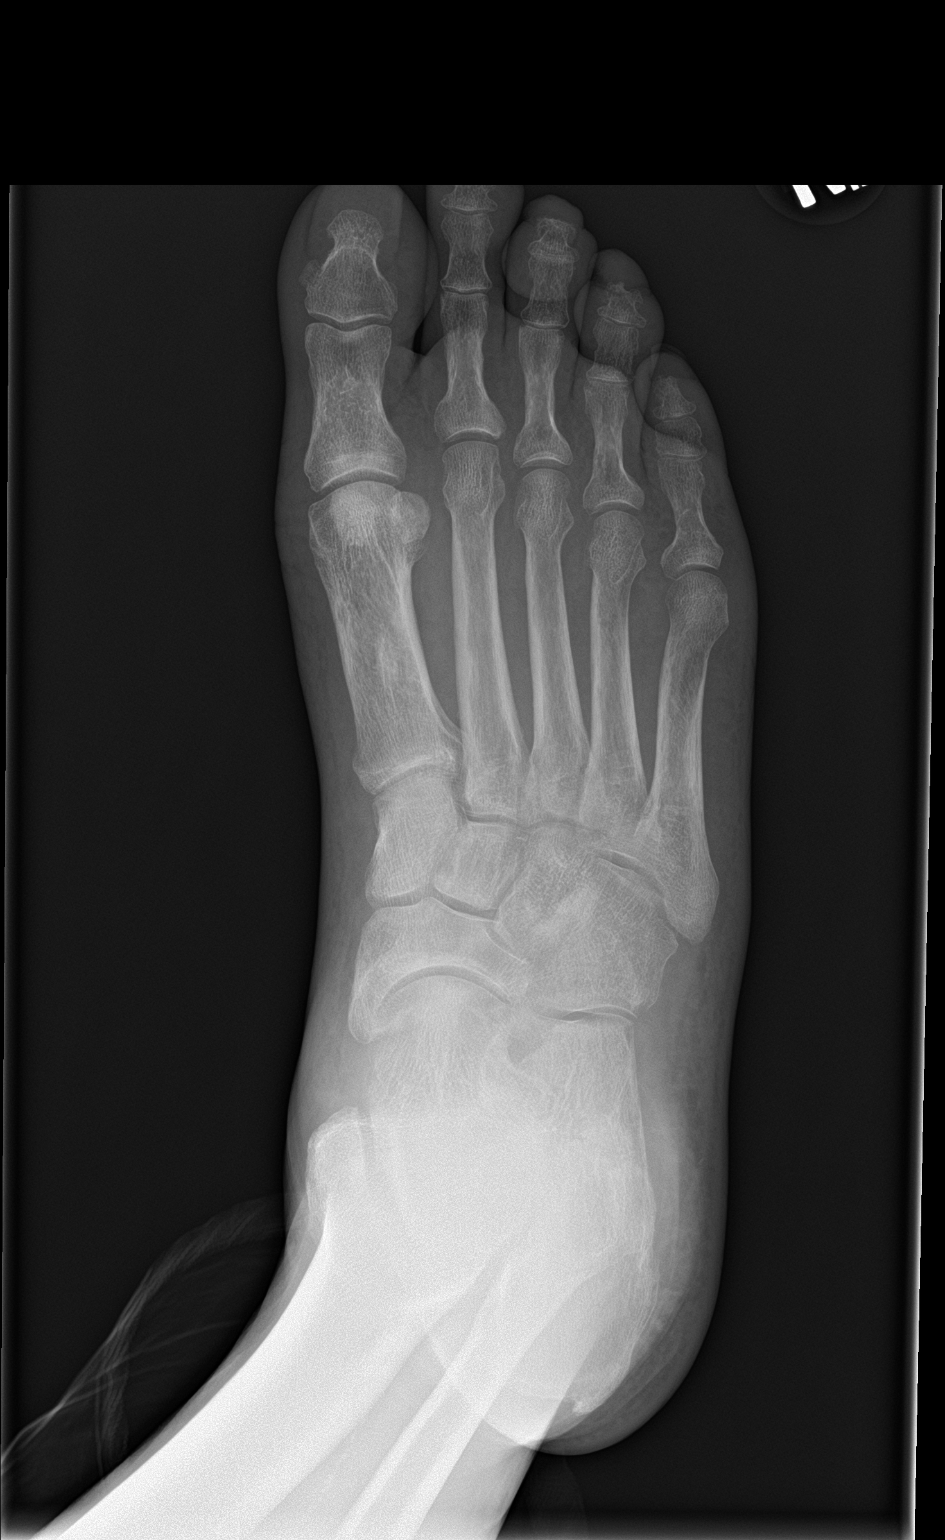

[foot obl]
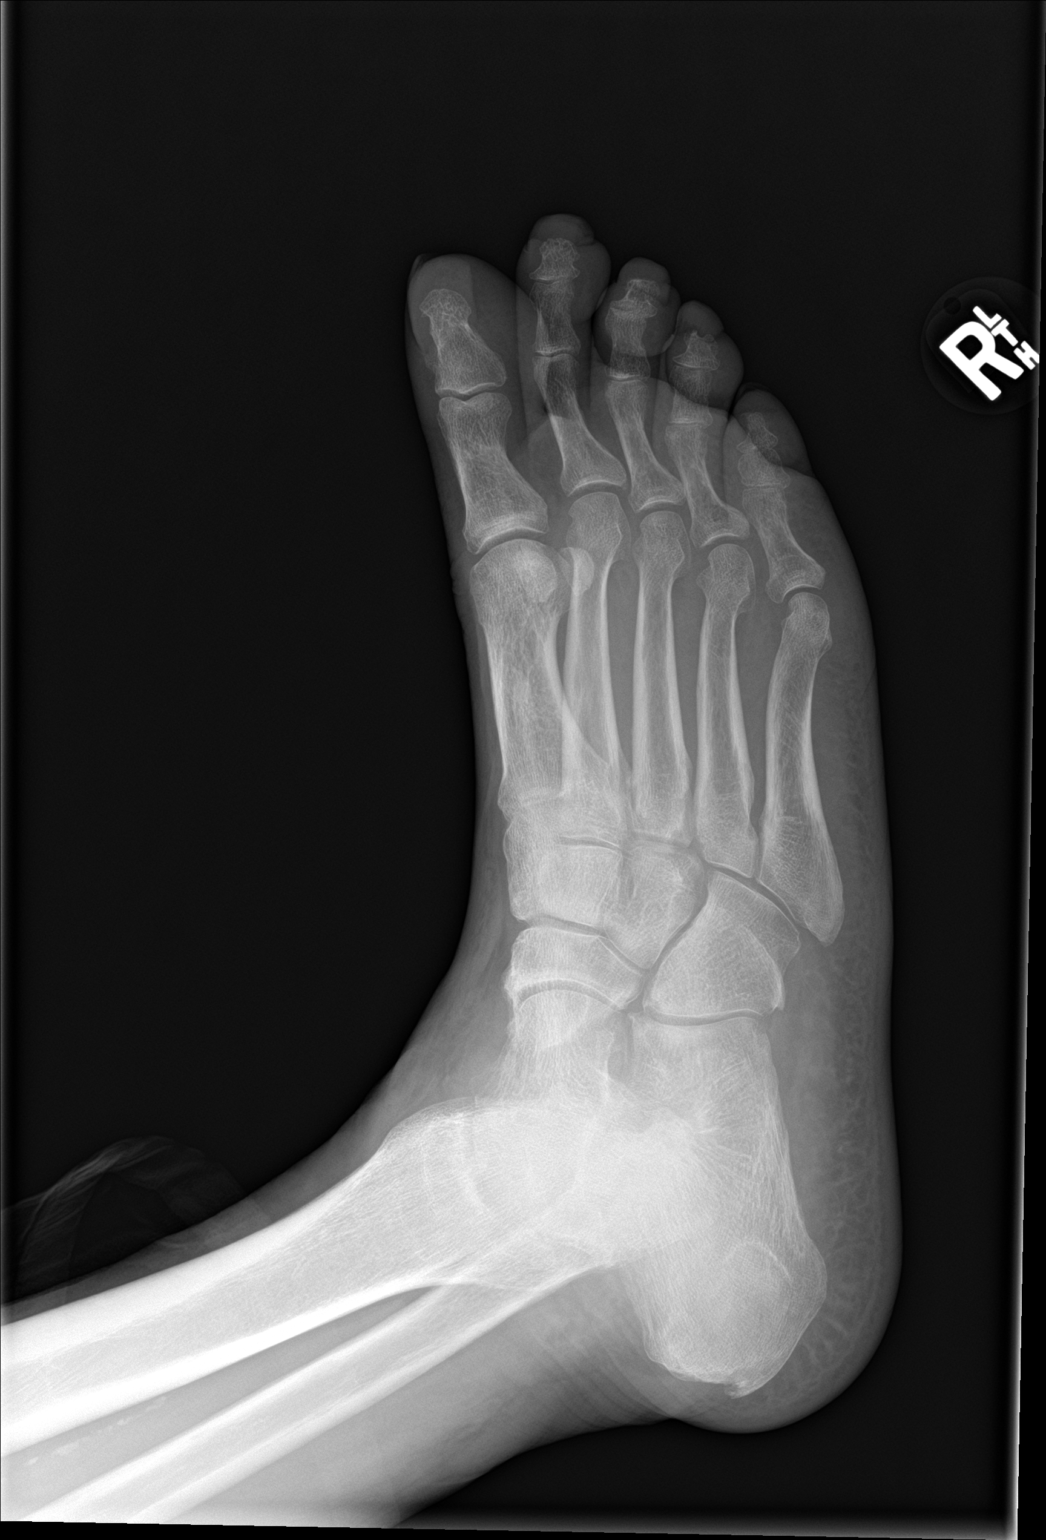

[foot lat]
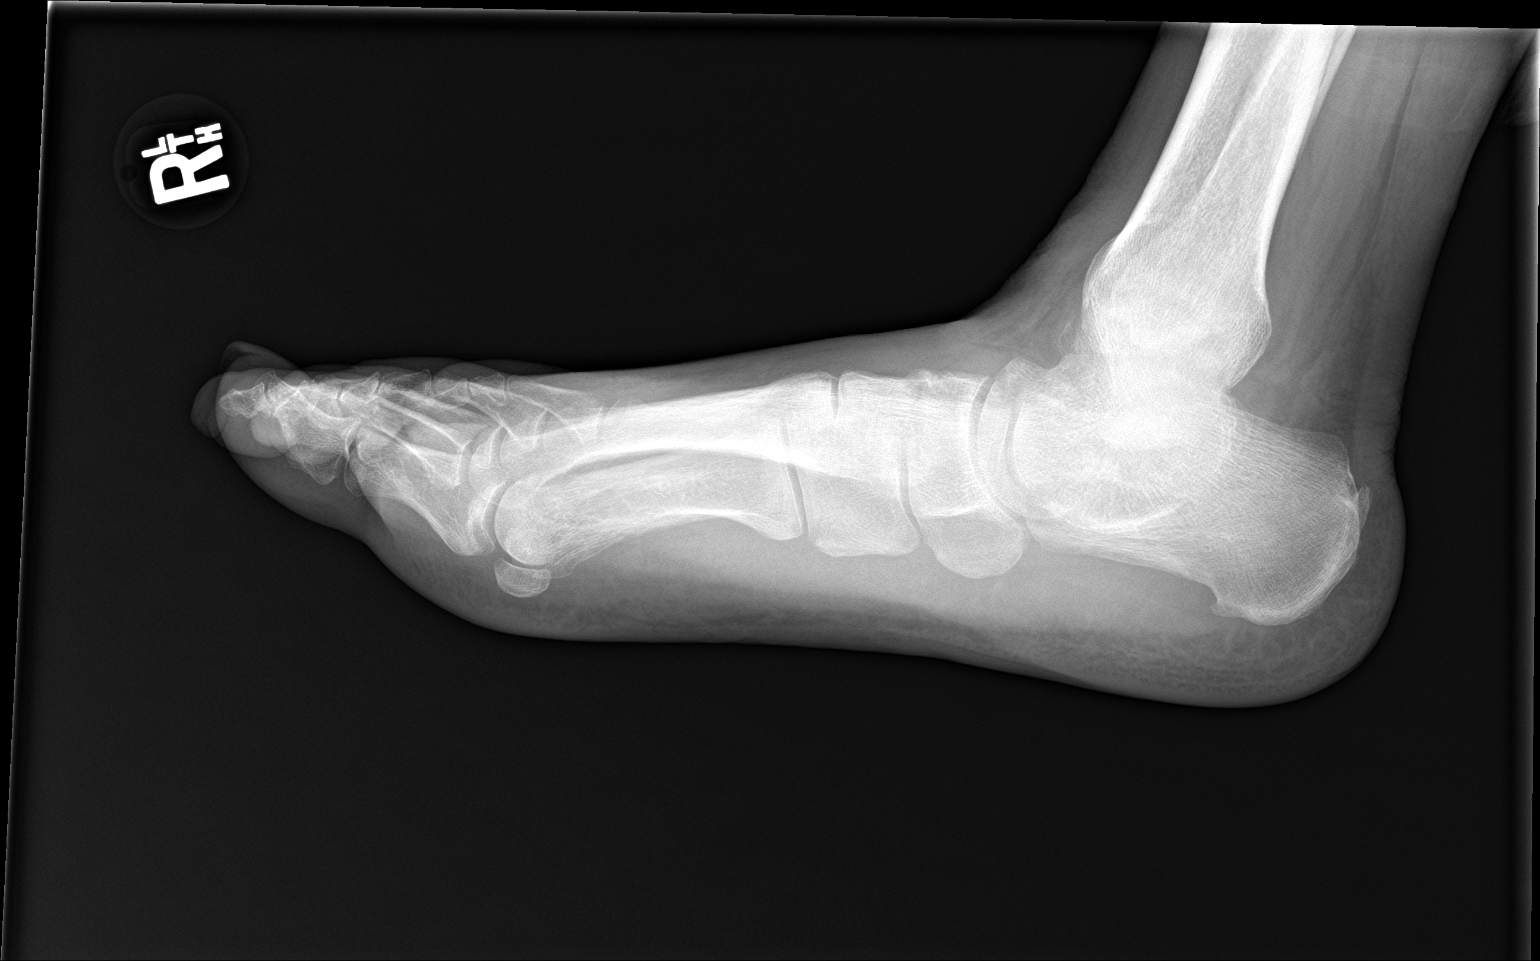

[3 of 3 positions shown; findings below may reference images not displayed]

FINDINGS: There is no evidence of fracture or dislocation. There is no
evidence of arthropathy or other focal bone abnormality. Soft
tissues are unremarkable. Flattening of the plantar arch.
IMPRESSION: Negative.

## 2018-02-27 MED ORDER — ACETAMINOPHEN 500 MG PO TABS
1000.0000 mg | ORAL_TABLET | Freq: Once | ORAL | Status: AC
Start: 2018-02-27 — End: 2018-02-27
  Administered 2018-02-27: 1000 mg via ORAL

## 2018-02-27 MED ORDER — INDOMETHACIN 25 MG PO CAPS: 25.0000 mg | ORAL_CAPSULE | Freq: Three times a day (TID) | ORAL | 0 refills | Status: DC | PRN

## 2018-02-27 MED ORDER — ACETAMINOPHEN 500 MG PO TABS
ORAL_TABLET | ORAL | Status: AC
  Filled 2018-02-27: qty 2

## 2018-02-27 MED ORDER — HYDROCODONE-ACETAMINOPHEN 5-325 MG PO TABS: 1.0000 | ORAL_TABLET | Freq: Four times a day (QID) | ORAL | 0 refills | Status: DC | PRN

## 2018-02-27 NOTE — ED Provider Notes (Addendum)
We were called by pharmacy.  They were unable to fill Dr. Estanislado Pandy prescription for [facility], as he did not sign his prescription.  I was unable to phone in prescription since patient left here greater than 2 hours ago.  I hand wrote prescription for Norco 5-3 25 1  to 2 tablets every 6 hours as needed pain dispense 12 tablets no refills   Orlie Dakin, MD 02/27/18 1054 I Did not have an encounter with this patient   Orlie Dakin, MD 02/27/18 1054

## 2018-02-27 NOTE — Discharge Instructions (Addendum)
Indomethacin as prescribed.  Hydrocodone is prescribed as needed for pain.  Follow-up with your primary doctor in the next week if symptoms are not improving.

## 2018-02-27 NOTE — ED Triage Notes (Signed)
Pt with c/o R foot pain x 2days ago. Pt has appt with MD tomorrow but states he couldn't take pain any longer.

## 2018-02-27 NOTE — ED Provider Notes (Addendum)
[facility] EMERGENCY DEPARTMENT Provider Note   CSN: [id number] Arrival date & time: 02/27/18  0217     History   Chief Complaint Chief Complaint  Patient presents with  . Foot Pain    HPI Vincen Bejar Lambert is a 73 y.o. male.  Patient is a 73 year old male with history of coronary artery disease, hypertension, and gout.  He presents today with complaints of right foot pain.  This began yesterday in the absence of any injury or trauma and is rapidly worsening.  His pain is worse with bearing weight and relieved with rest.  The history is provided by the patient.  Foot Pain  This is a new problem. The current episode started yesterday. The problem occurs constantly. The problem has been rapidly worsening. The symptoms are aggravated by walking. Nothing relieves the symptoms. He has tried nothing for the symptoms.    Past Medical History:  Diagnosis Date  . Coronary artery disease   . Hyperlipidemia   . Hypertension   . Hypertension   . MI, old   . Peripheral arterial disease ([place])   . Tobacco abuse   . Type 2 diabetes mellitus [facility])     Patient Active Problem List   Diagnosis Date Noted  . Acute respiratory failure with hypoxemia ([place]) 07/02/2016  . COPD with acute exacerbation ([place]) 07/02/2016  . Tobacco use 07/17/2013  . Peripheral arterial disease ([place]) 12/03/2012  . Coronary artery disease 12/03/2012  . Essential hypertension 12/03/2012  . Hyperlipidemia 12/03/2012  . Type 2 diabetes mellitus ([place]) 12/03/2012    Past Surgical History:  Procedure Laterality Date  . CARDIAC SURGERY    . CORONARY STENT PLACEMENT          Home Medications    Prior to Admission medications   Medication Sig Start Date End Date Taking? Authorizing Provider  amLODipine (NORVASC) 10 MG tablet Take 1 tablet (10 mg total) by mouth daily. 05/29/17  Yes Lorretta Harp, MD  aspirin 81 MG chewable tablet Chew 81 mg by mouth daily.     Yes [provider]  clopidogrel  (PLAVIX) 75 MG tablet Take 1 tablet (75 mg total) by mouth once. Please keep upcoming appointment 01/28/15  Yes Lorretta Harp, MD  clopidogrel (PLAVIX) 75 MG tablet Take 1 tablet (75 mg total) by mouth daily. 07/03/17  Yes Lorretta Harp, MD  doxazosin (CARDURA) 2 MG tablet Take 2 mg by mouth daily. 05/19/17  Yes [provider]  ferrous sulfate 325 (65 FE) MG tablet Take 325 mg by mouth 3 (three) times daily. 05/13/17  Yes [provider]  KLOR-CON M20 20 MEQ tablet TAKE 1 TABLET (20 MEQ TOTAL) BY MOUTH DAILY. PLEASE KEEP UPCOMING APPOINTMENT. 04/20/16  Yes Lorretta Harp, MD  nitroGLYCERIN (NITROSTAT) 0.4 MG SL tablet PLACE 1 TABLET UNDER THE TONGUE EVERY 5 MINUTES AS NEEDED FOR CHEST PAIN 02/13/18  Yes Lorretta Harp, MD  potassium chloride SA (KLOR-CON M20) 20 MEQ tablet Take 1 tablet (20 mEq total) by mouth daily. 05/29/17  Yes Lorretta Harp, MD  albuterol (PROVENTIL HFA;VENTOLIN HFA) 108 (90 Base) MCG/ACT inhaler Inhale 2 puffs into the lungs every 4 (four) hours as needed for wheezing or shortness of breath. 04/10/16   Horton, Barbette Hair, MD  ezetimibe (ZETIA) 10 MG tablet Take 1 tablet (10 mg total) by mouth daily. 05/03/16   Lorretta Harp, MD  potassium chloride (KLOR-CON) 20 MEQ packet TAKE 1 TABLET BY MOUTH EVERY DAY 12/24/12 07/14/13  Lorretta Harp, MD    Family History Family History  Problem Relation Age of Onset  . Cancer Mother     Social History Social History   Tobacco Use  . Smoking status: Current Every Day Smoker    Packs/day: 0.50    Years: 52.00    Pack years: 26.00    Types: Cigarettes  . Smokeless tobacco: Never Used  Substance Use Topics  . Alcohol use: No  . Drug use: No     Allergies   Patient has no known allergies.   Review of Systems Review of Systems  All other systems reviewed and are negative.    Physical Exam Updated Vital Signs BP (!) 144/62 (BP Location: Left Arm)   Pulse 79   Temp 98.7 F (37.1 C)  (Oral)   Resp 18   Ht 5\' 8"  (1.727 m)   Wt 89.4 kg   SpO2 98%   BMI 29.95 kg/m   Physical Exam Vitals signs and nursing note reviewed.  Constitutional:      Appearance: Normal appearance.  HENT:     Head: Normocephalic and atraumatic.  Pulmonary:     Effort: Pulmonary effort is normal.  Musculoskeletal:     Comments: The right foot appears grossly normal.  There is tenderness to palpation along the plantar surface near the distal first metatarsal.  He has pain with range of motion of his great toe.  Skin:    General: Skin is warm and dry.  Neurological:     Mental Status: He is alert.      ED Treatments / Results  Labs (all labs ordered are listed, but only abnormal results are displayed) Labs Reviewed - No data to display  EKG None  Radiology Dg Foot Complete Right  Result Date: 02/27/2018 CLINICAL DATA:  Right foot pain and history of gout EXAM: RIGHT FOOT COMPLETE - 3+ VIEW COMPARISON:  None. FINDINGS: There is no evidence of fracture or dislocation. There is no evidence of arthropathy or other focal bone abnormality. Soft tissues are unremarkable. Flattening of the plantar arch. IMPRESSION: Negative. Electronically Signed   By: Ulyses Jarred M.D.   On: 02/27/2018 03:39    Procedures Procedures (including critical care time)  Medications Ordered in ED Medications - No data to display   Initial Impression / Assessment and Plan / ED Course  I have reviewed the triage vital signs and the nursing notes.  Pertinent labs & imaging results that were available during my care of the patient were reviewed by me and considered in my medical decision making (see chart for details).  X-rays are negative.  This appears to be gout.  This will be treated with Indocin, pain medicine, and follow-up as needed.  [place] reviewed prior to prescribing with no controlled substances filled by this patient on record.  Final Clinical Impressions(s) / ED Diagnoses    Final diagnoses:  None    ED Discharge Orders    None       Veryl Speak, MD 02/27/18 [id number]    Veryl Speak, MD 02/27/18 [phone]

## 2018-02-27 NOTE — ED Notes (Signed)
Patient transported to X-ray 

## 2018-03-03 DIAGNOSIS — Z6827 Body mass index (BMI) 27.0-27.9, adult: Secondary | ICD-10-CM | POA: Diagnosis not present

## 2018-03-03 DIAGNOSIS — E663 Overweight: Secondary | ICD-10-CM | POA: Diagnosis not present

## 2018-03-03 DIAGNOSIS — I10 Essential (primary) hypertension: Secondary | ICD-10-CM | POA: Diagnosis not present

## 2018-03-03 DIAGNOSIS — M109 Gout, unspecified: Secondary | ICD-10-CM | POA: Diagnosis not present

## 2018-03-03 DIAGNOSIS — M25571 Pain in right ankle and joints of right foot: Secondary | ICD-10-CM | POA: Diagnosis not present

## 2018-03-10 DIAGNOSIS — E1142 Type 2 diabetes mellitus with diabetic polyneuropathy: Secondary | ICD-10-CM | POA: Diagnosis not present

## 2018-03-10 DIAGNOSIS — I739 Peripheral vascular disease, unspecified: Secondary | ICD-10-CM | POA: Diagnosis not present

## 2018-03-10 DIAGNOSIS — Z6827 Body mass index (BMI) 27.0-27.9, adult: Secondary | ICD-10-CM | POA: Diagnosis not present

## 2018-03-10 DIAGNOSIS — J329 Chronic sinusitis, unspecified: Secondary | ICD-10-CM | POA: Diagnosis not present

## 2018-03-10 DIAGNOSIS — J189 Pneumonia, unspecified organism: Secondary | ICD-10-CM | POA: Diagnosis not present

## 2018-06-16 ENCOUNTER — Other Ambulatory Visit: Payer: Self-pay | Admitting: Cardiovascular Disease

## 2018-06-16 MED ORDER — CLOPIDOGREL BISULFATE 75 MG PO TABS: 75.0000 mg | ORAL_TABLET | Freq: Once | ORAL | 3 refills | Status: AC

## 2018-06-16 MED ORDER — NITROGLYCERIN 0.4 MG SL SUBL: SUBLINGUAL_TABLET | SUBLINGUAL | 3 refills | Status: DC

## 2018-06-16 NOTE — Telephone Encounter (Signed)
Clopidogrel and NTG SL refilled.

## 2018-06-16 NOTE — Telephone Encounter (Signed)
New Message     *STAT* If patient is at the pharmacy, call can be transferred to refill team.   1. Which medications need to be refilled? (please list name of each medication and dose if known) Nitroglycerin, Clopidogrel  2. Which pharmacy/location (including street and city if local pharmacy) [facility] [place]   3. Do they need a 30 day or 90 day supply? 90 Days

## 2018-07-08 ENCOUNTER — Other Ambulatory Visit: Payer: Self-pay | Admitting: Cardiovascular Disease

## 2018-09-11 ENCOUNTER — Other Ambulatory Visit: Payer: Self-pay | Admitting: Cardiovascular Disease

## 2018-09-17 DIAGNOSIS — N183 Chronic kidney disease, stage 3 (moderate): Secondary | ICD-10-CM | POA: Diagnosis not present

## 2018-09-17 DIAGNOSIS — E7849 Other hyperlipidemia: Secondary | ICD-10-CM | POA: Diagnosis not present

## 2018-09-17 DIAGNOSIS — Z6826 Body mass index (BMI) 26.0-26.9, adult: Secondary | ICD-10-CM | POA: Diagnosis not present

## 2018-09-17 DIAGNOSIS — E1140 Type 2 diabetes mellitus with diabetic neuropathy, unspecified: Secondary | ICD-10-CM | POA: Diagnosis not present

## 2018-09-17 DIAGNOSIS — I10 Essential (primary) hypertension: Secondary | ICD-10-CM | POA: Diagnosis not present

## 2018-09-17 DIAGNOSIS — Z0000 Encounter for general adult medical examination without abnormal findings: Secondary | ICD-10-CM | POA: Diagnosis not present

## 2018-09-17 DIAGNOSIS — E119 Type 2 diabetes mellitus without complications: Secondary | ICD-10-CM | POA: Diagnosis not present

## 2018-09-17 DIAGNOSIS — Z1389 Encounter for screening for other disorder: Secondary | ICD-10-CM | POA: Diagnosis not present

## 2018-09-26 ENCOUNTER — Telehealth: Payer: Self-pay | Admitting: *Deleted

## 2018-09-26 NOTE — Telephone Encounter (Addendum)

## 2018-10-01 ENCOUNTER — Telehealth (INDEPENDENT_AMBULATORY_CARE_PROVIDER_SITE_OTHER): Payer: PPO | Admitting: Cardiovascular Disease

## 2018-10-01 ENCOUNTER — Telehealth: Payer: Self-pay

## 2018-10-01 ENCOUNTER — Other Ambulatory Visit: Payer: Self-pay

## 2018-10-01 DIAGNOSIS — J441 Chronic obstructive pulmonary disease with (acute) exacerbation: Secondary | ICD-10-CM

## 2018-10-01 DIAGNOSIS — I10 Essential (primary) hypertension: Secondary | ICD-10-CM

## 2018-10-01 DIAGNOSIS — Z720 Tobacco use: Secondary | ICD-10-CM

## 2018-10-01 DIAGNOSIS — I2510 Atherosclerotic heart disease of native coronary artery without angina pectoris: Secondary | ICD-10-CM

## 2018-10-01 DIAGNOSIS — E782 Mixed hyperlipidemia: Secondary | ICD-10-CM

## 2018-10-01 NOTE — Progress Notes (Signed)
Virtual Visit via Telephone Note   This visit type was conducted due to national recommendations for restrictions regarding the COVID-19 Pandemic (e.g. social distancing) in an effort to limit this patient's exposure and mitigate transmission in our community.  Due to his co-morbid illnesses, this patient is at least at moderate risk for complications without adequate follow up.  This format is felt to be most appropriate for this patient at this time.  The patient did not have access to video technology/had technical difficulties with video requiring transitioning to audio format only (telephone).  All issues noted in this document were discussed and addressed.  No physical exam could be performed with this format.  Please refer to the patient's chart for his  consent to telehealth for [facility].   Date:  10/01/2018   ID:  Peter Lambert, DOB Nov 24, 1945, MRN [id number]  Patient Location: Home Provider Location: Home  PCP:  Redmond School, MD  Cardiologist: Dr. Quay Burow Electrophysiologist:  None   Evaluation Performed:  Follow-Up Visit  Chief Complaint: Follow-up CAD, PAD, hypertension and hyperlipidemia  History of Present Illness:    Peter Lambert is a 73 y.o.  mildly overweight divorced African American male, father of 2, grandfather to 4 grandchildren, who I last saw in the office 05/29/2017. He had a non-ST-segment-elevation myocardial infarction in April 2010. I catheterized him revealing high-grade proximal LAD stenosis, which I stented using a 2.75 x 18 mm long Xience V drug-eluting stent. I did jail the first diagonal branch, performed angioplasty at the ostium, reducing an 80% stenosis to less than 40%. He had a dominant circumflex system with an 80% distal AV groove circumflex as well as a nondominant RCA. He had normal LV function. Abdominal aortogram revealed a patent aortobifemoral bypass graft, which was placed approximately 16 years ago. He has known occluded SFAs  bilaterally with ABIs that run in the 0.5 or 0.6 range. He denies chest pain, shortness of breath, or claudication. He continues to smoke 3 packs of cigarettes per week despite counseling to the contrary. He did stop smoking for a while and then restarted back and a third pack a day.  Since I saw him a year ago he is done well.  He denies chest pain, shortness of breath or claudication.  He remains on dual antiplatelet therapy.  The patient does not have symptoms concerning for COVID-19 infection (fever, chills, cough, or new shortness of breath).    Past Medical History:  Diagnosis Date   Coronary artery disease    Hyperlipidemia    Hypertension    Hypertension    MI, old    Peripheral arterial disease ([facility])    Tobacco abuse    Type 2 diabetes mellitus ([facility])    Past Surgical History:  Procedure Laterality Date   CARDIAC SURGERY     CORONARY STENT PLACEMENT       Current Meds  Medication Sig   albuterol (PROVENTIL HFA;VENTOLIN HFA) 108 (90 Base) MCG/ACT inhaler Inhale 2 puffs into the lungs every 4 (four) hours as needed for wheezing or shortness of breath.   amLODipine (NORVASC) 10 MG tablet Take 1 tablet (10 mg total) by mouth daily.   aspirin 81 MG chewable tablet Chew 81 mg by mouth daily.     ferrous sulfate 325 (65 FE) MG tablet Take 325 mg by mouth 3 (three) times daily.   HYDROcodone-acetaminophen (NORCO) 5-325 MG tablet Take 1-2 tablets by mouth every 6 (six) hours as needed.  indomethacin (INDOCIN) 25 MG capsule Take 1 capsule (25 mg total) by mouth 3 (three) times daily as needed.   KLOR-CON M20 20 MEQ tablet TAKE 1 TABLET BY MOUTH EVERY DAY   nitroGLYCERIN (NITROSTAT) 0.4 MG SL tablet PLACE 1 TABLET UNDER THE TONGUE EVERY 5 MINUTES AS NEEDED FOR CHEST PAIN   [DISCONTINUED] doxazosin (CARDURA) 2 MG tablet Take 2 mg by mouth daily.     Allergies:   Patient has no known allergies.   Social History   Tobacco Use   Smoking status: Current Every  Day Smoker    Packs/day: 0.50    Years: 52.00    Pack years: 26.00    Types: Cigarettes   Smokeless tobacco: Never Used  Substance Use Topics   Alcohol use: No   Drug use: No     Family Hx: [name] family history includes Cancer in his mother.  ROS:   Please see the history of present illness.     All other systems reviewed and are negative.   Prior CV studies:   The following studies were reviewed today:  None  Labs/Other Tests and Data Reviewed:    EKG:  No ECG reviewed.  Recent Labs: No results found for requested labs within last 8760 hours.   Recent Lipid Panel Lab Results  Component Value Date/Time   CHOL (H) 06/02/2008 09:40 AM    208        ATP III CLASSIFICATION:  <200     mg/dL   Desirable  200-239  mg/dL   Borderline High  >=240    mg/dL   High          TRIG 134 06/02/2008 09:40 AM   HDL 28 (L) 06/02/2008 09:40 AM   CHOLHDL 7.4 06/02/2008 09:40 AM   LDLCALC (H) 06/02/2008 09:40 AM    153        Total Cholesterol/HDL:CHD Risk Coronary Heart Disease Risk Table                     Men   Women  1/2 Average Risk   3.4   3.3  Average Risk       5.0   4.4  2 X Average Risk   9.6   7.1  3 X Average Risk  23.4   11.0        Use the calculated Patient Ratio above and the CHD Risk Table to determine the patient's CHD Risk.        ATP III CLASSIFICATION (LDL):  <100     mg/dL   Optimal  100-129  mg/dL   Near or Above                    Optimal  130-159  mg/dL   Borderline  160-189  mg/dL   High  >190     mg/dL   Very High    Wt Readings from Last 3 Encounters:  02/27/18 197 lb (89.4 kg)  05/29/17 191 lb (86.6 kg)  07/03/16 184 lb 1.4 oz (83.5 kg)     Objective:    Vital Signs:  There were no vitals taken for this visit.   VITAL SIGNS:  reviewed a complete physical exam was not performed today since this was a virtual telemedicine phone visit  ASSESSMENT & PLAN:    1. Coronary artery disease- history of CAD status post non-STEMI  4/10 with stenting of his LAD.  He denies chest pain or shortness  of breath. 2. Essential hypertension- history essentially potential blood pressure measured today at home of 137/78.  He is on amlodipine. 3. Peripheral arterial disease- remote history of aortobifemoral bypass grafting with known occlusion of his SFAs bilaterally and ABIs in the 0.5 range.  He denies claudication 4. Tobacco abuse- long history tobacco abuse currently smoking 3 packs a week recalcitrant to risk factor modification  COVID-19 Education: The signs and symptoms of COVID-19 were discussed with the patient and how to seek care for testing (follow up with PCP or arrange E-visit).  The importance of social distancing was discussed today.  Time:   Today, I have spent 3 minutes with the patient with telehealth technology discussing the above problems.     Medication Adjustments/Labs and Tests Ordered: Current medicines are reviewed at length with the patient today.  Concerns regarding medicines are outlined above.   Tests Ordered: No orders of the defined types were placed in this encounter.   Medication Changes: No orders of the defined types were placed in this encounter.   Follow Up:  In Person in 1 year(s)  Signed, Quay Burow, MD  10/01/2018 11:00 AM    [place]

## 2018-10-01 NOTE — Telephone Encounter (Signed)
Patient and/or DPR-approved person aware of 8/5 AVS instructions and verbalized understanding.  Letter including After Visit Summary and any other necessary documents to be mailed to the patient's address on file.

## 2018-10-01 NOTE — Patient Instructions (Signed)
Medication Instructions:  Your physician recommends that you continue on your current medications as directed. Please refer to the Current Medication list given to you today.  If you need a refill on your cardiac medications before your next appointment, please call your pharmacy.   Lab work: none If you have labs (blood work) drawn today and your tests are completely normal, you will receive your results only by: Marland Kitchen MyChart Message (if you have MyChart) OR . A paper copy in the mail If you have any lab test that is abnormal or we need to change your treatment, we will call you to review the results.  Testing/Procedures: none  Follow-Up: At [facility], you and your health needs are our priority.  As part of our continuing mission to provide you with exceptional heart care, we have created designated Provider Care Teams.  These Care Teams include your primary Cardiologist (physician) and Advanced Practice Providers (APPs -  Physician Assistants and Nurse Practitioners) who all work together to provide you with the care you need, when you need it. You will need a follow up appointment in 12 months with Dr. Gwenlyn Found.  Please call our office 2 months in advance to schedule this appointment.

## 2018-10-03 ENCOUNTER — Other Ambulatory Visit: Payer: Self-pay | Admitting: Cardiovascular Disease

## 2018-11-11 ENCOUNTER — Other Ambulatory Visit: Payer: Self-pay | Admitting: Cardiovascular Disease

## 2018-11-11 DIAGNOSIS — Z23 Encounter for immunization: Secondary | ICD-10-CM | POA: Diagnosis not present

## 2018-11-26 DIAGNOSIS — Z681 Body mass index (BMI) 19 or less, adult: Secondary | ICD-10-CM | POA: Diagnosis not present

## 2018-11-26 DIAGNOSIS — J209 Acute bronchitis, unspecified: Secondary | ICD-10-CM | POA: Diagnosis not present

## 2018-11-26 DIAGNOSIS — J069 Acute upper respiratory infection, unspecified: Secondary | ICD-10-CM | POA: Diagnosis not present

## 2018-12-03 ENCOUNTER — Other Ambulatory Visit: Payer: Self-pay | Admitting: Cardiovascular Disease

## 2019-01-22 ENCOUNTER — Other Ambulatory Visit: Payer: Self-pay | Admitting: Cardiovascular Disease

## 2019-01-28 ENCOUNTER — Other Ambulatory Visit (HOSPITAL_COMMUNITY): Payer: Self-pay | Admitting: Family Medicine

## 2019-01-28 ENCOUNTER — Ambulatory Visit (HOSPITAL_COMMUNITY)
Admission: RE | Admit: 2019-01-28 | Discharge: 2019-01-28 | Disposition: A | Discharge status: Home / Self Care | Payer: PPO | Source: Ambulatory Visit | Attending: Family Medicine | Admitting: Family Medicine

## 2019-01-28 ENCOUNTER — Other Ambulatory Visit: Payer: Self-pay

## 2019-01-28 DIAGNOSIS — Z719 Counseling, unspecified: Secondary | ICD-10-CM | POA: Diagnosis not present

## 2019-01-28 DIAGNOSIS — J069 Acute upper respiratory infection, unspecified: Secondary | ICD-10-CM | POA: Insufficient documentation

## 2019-01-28 DIAGNOSIS — Z681 Body mass index (BMI) 19 or less, adult: Secondary | ICD-10-CM | POA: Diagnosis not present

## 2019-01-28 DIAGNOSIS — J449 Chronic obstructive pulmonary disease, unspecified: Secondary | ICD-10-CM | POA: Diagnosis not present

## 2019-01-28 DIAGNOSIS — J40 Bronchitis, not specified as acute or chronic: Secondary | ICD-10-CM | POA: Diagnosis not present

## 2019-01-28 IMAGING — DX DG CHEST 2V
3 series · 3 of 3 positions shown · non-contrast
Comparison: [DATE] and earlier exams.

CLINICAL DATA: Pt states he was recently diagnosed with bronchitis
and his PCP wanted a follow up CXR to check his lungs. Pt denies any
symptoms or problems at this time. Pt denies any on body medical
injector devices. Pt has hx of diabetes and hypertension and is a
smoker.

EXAM:
CHEST - 2 VIEW

[chest pa (1 of 2)]
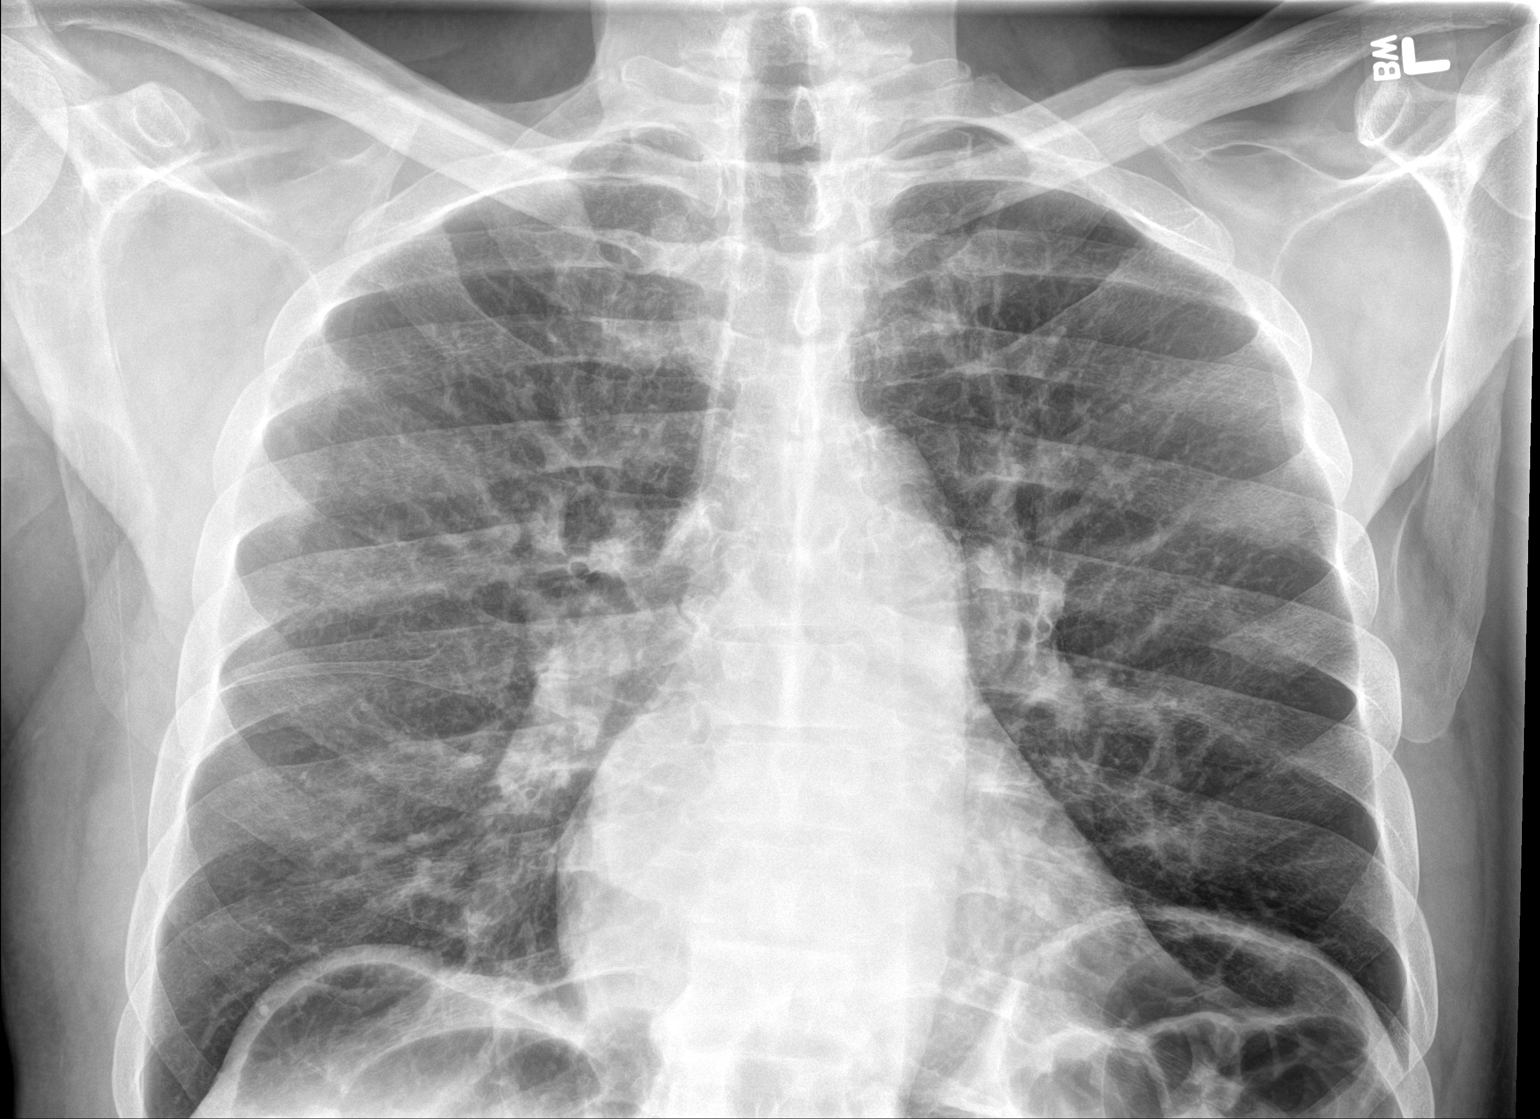

[chest lat]
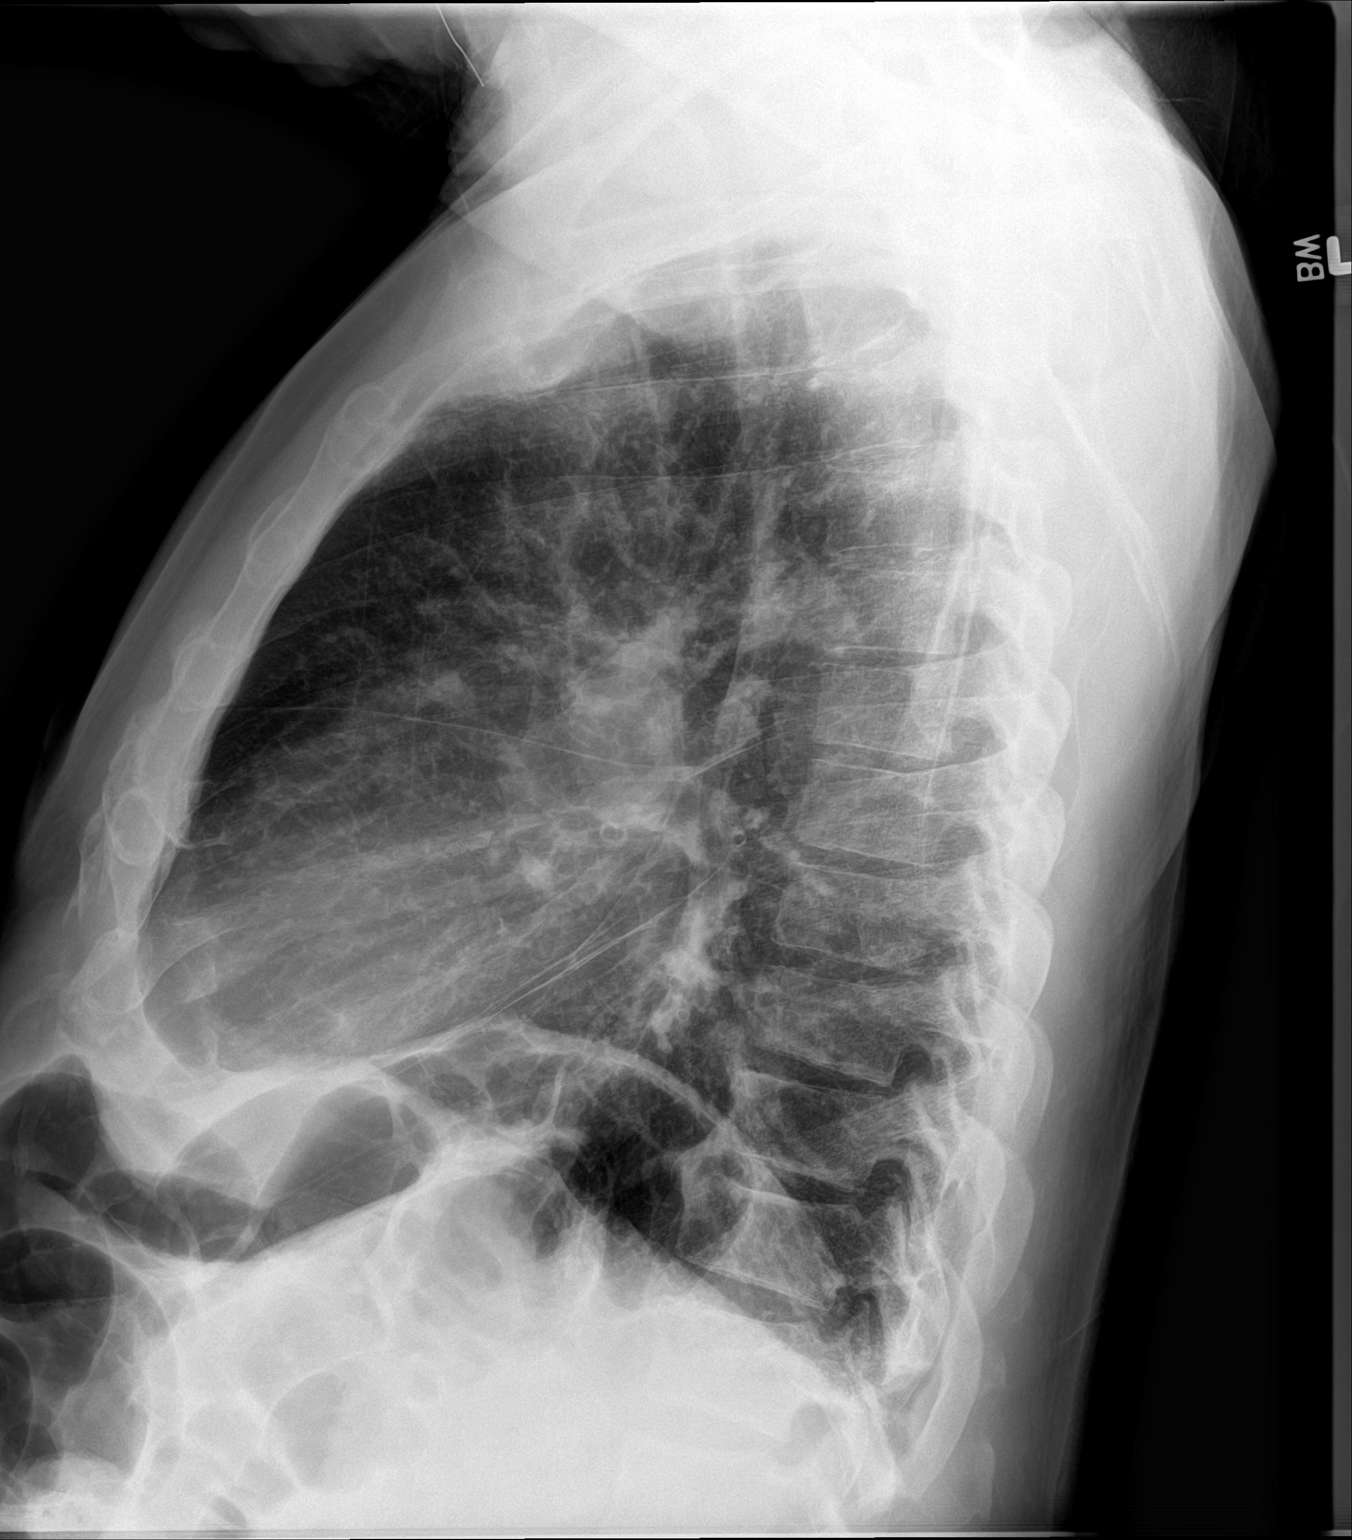

[chest pa (2 of 2)]
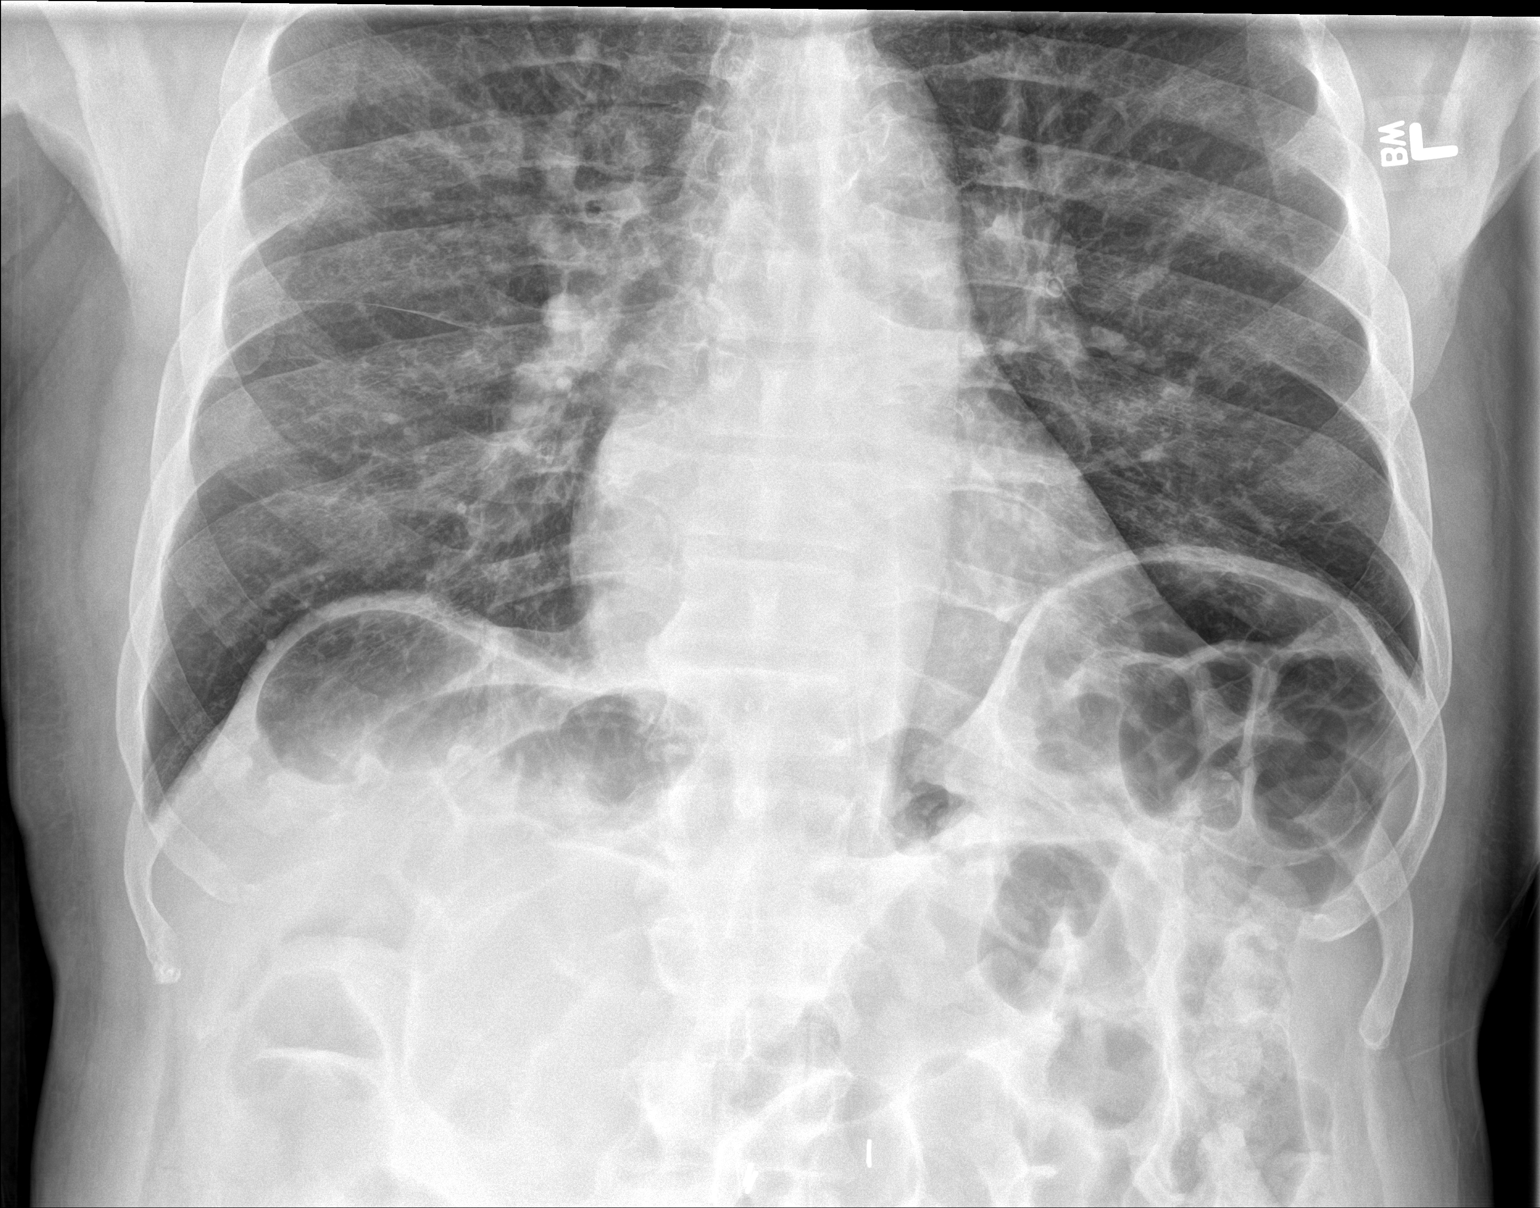

[3 of 3 positions shown; findings below may reference images not displayed]

FINDINGS: Cardiac silhouette is normal in size. No mediastinal or hilar
masses. There is no evidence of adenopathy.

Clear lungs.  No pleural effusion or pneumothorax.

Skeletal structures are intact.
IMPRESSION: No active cardiopulmonary disease.

## 2019-02-27 DIAGNOSIS — I469 Cardiac arrest, cause unspecified: Secondary | ICD-10-CM

## 2019-02-27 HISTORY — DX: Cardiac arrest, cause unspecified: I46.9

## 2019-03-12 ENCOUNTER — Telehealth: Payer: Self-pay | Admitting: Cardiovascular Disease

## 2019-03-12 NOTE — Telephone Encounter (Signed)
*  STAT* If patient is at the pharmacy, call can be transferred to refill team.   1. Which medications need to be refilled? (please list name of each medication and dose if known) nitroGLYCERIN (NITROSTAT) 0.4 MG SL tablet  2. Which pharmacy/location (including street and city if local pharmacy) is medication to be sent to[id number] [place] [place] [facility] [place]  3. Do they need a 30 day or 90 day supply? 90 day

## 2019-03-13 MED ORDER — NITROGLYCERIN 0.4 MG SL SUBL: 0.4000 mg | SUBLINGUAL_TABLET | SUBLINGUAL | 1 refills | Status: DC | PRN

## 2019-03-13 NOTE — Telephone Encounter (Signed)
LVM about prescription being refilled.

## 2019-03-13 NOTE — Telephone Encounter (Signed)
Can you take care of this [facility]

## 2019-03-29 DIAGNOSIS — E1122 Type 2 diabetes mellitus with diabetic chronic kidney disease: Secondary | ICD-10-CM | POA: Diagnosis not present

## 2019-03-29 DIAGNOSIS — J449 Chronic obstructive pulmonary disease, unspecified: Secondary | ICD-10-CM | POA: Diagnosis not present

## 2019-03-29 DIAGNOSIS — F17210 Nicotine dependence, cigarettes, uncomplicated: Secondary | ICD-10-CM | POA: Diagnosis not present

## 2019-03-29 DIAGNOSIS — I129 Hypertensive chronic kidney disease with stage 1 through stage 4 chronic kidney disease, or unspecified chronic kidney disease: Secondary | ICD-10-CM | POA: Diagnosis not present

## 2019-04-02 DIAGNOSIS — J209 Acute bronchitis, unspecified: Secondary | ICD-10-CM | POA: Diagnosis not present

## 2019-04-02 DIAGNOSIS — R066 Hiccough: Secondary | ICD-10-CM | POA: Diagnosis not present

## 2019-04-02 DIAGNOSIS — Z681 Body mass index (BMI) 19 or less, adult: Secondary | ICD-10-CM | POA: Diagnosis not present

## 2019-04-14 DIAGNOSIS — J418 Mixed simple and mucopurulent chronic bronchitis: Secondary | ICD-10-CM | POA: Diagnosis not present

## 2019-04-14 DIAGNOSIS — J449 Chronic obstructive pulmonary disease, unspecified: Secondary | ICD-10-CM | POA: Diagnosis not present

## 2019-04-14 DIAGNOSIS — Z681 Body mass index (BMI) 19 or less, adult: Secondary | ICD-10-CM | POA: Diagnosis not present

## 2019-04-14 DIAGNOSIS — I10 Essential (primary) hypertension: Secondary | ICD-10-CM | POA: Diagnosis not present

## 2019-04-14 DIAGNOSIS — E1129 Type 2 diabetes mellitus with other diabetic kidney complication: Secondary | ICD-10-CM | POA: Diagnosis not present

## 2019-04-14 DIAGNOSIS — I739 Peripheral vascular disease, unspecified: Secondary | ICD-10-CM | POA: Diagnosis not present

## 2019-04-14 DIAGNOSIS — J9801 Acute bronchospasm: Secondary | ICD-10-CM | POA: Diagnosis not present

## 2019-04-20 ENCOUNTER — Other Ambulatory Visit: Payer: Self-pay

## 2019-04-20 ENCOUNTER — Encounter (HOSPITAL_COMMUNITY): Payer: Self-pay | Admitting: Emergency Medicine

## 2019-04-20 ENCOUNTER — Emergency Department (HOSPITAL_COMMUNITY): Payer: PPO

## 2019-04-20 ENCOUNTER — Inpatient Hospital Stay (HOSPITAL_COMMUNITY): Payer: PPO

## 2019-04-20 ENCOUNTER — Inpatient Hospital Stay (HOSPITAL_COMMUNITY)
Admission: EM | Admit: 2019-04-20 | Discharge: 2019-04-23 | DRG: 291 | Discharge status: Against Medical Advice | Payer: PPO | Attending: Internal Medicine | Admitting: Internal Medicine

## 2019-04-20 DIAGNOSIS — E1151 Type 2 diabetes mellitus with diabetic peripheral angiopathy without gangrene: Secondary | ICD-10-CM | POA: Diagnosis not present

## 2019-04-20 DIAGNOSIS — I110 Hypertensive heart disease with heart failure: Secondary | ICD-10-CM | POA: Diagnosis not present

## 2019-04-20 DIAGNOSIS — R0902 Hypoxemia: Secondary | ICD-10-CM | POA: Diagnosis not present

## 2019-04-20 DIAGNOSIS — I4892 Unspecified atrial flutter: Secondary | ICD-10-CM | POA: Diagnosis not present

## 2019-04-20 DIAGNOSIS — I2510 Atherosclerotic heart disease of native coronary artery without angina pectoris: Secondary | ICD-10-CM | POA: Diagnosis not present

## 2019-04-20 DIAGNOSIS — Z681 Body mass index (BMI) 19 or less, adult: Secondary | ICD-10-CM | POA: Diagnosis not present

## 2019-04-20 DIAGNOSIS — E785 Hyperlipidemia, unspecified: Secondary | ICD-10-CM | POA: Diagnosis present

## 2019-04-20 DIAGNOSIS — J441 Chronic obstructive pulmonary disease with (acute) exacerbation: Secondary | ICD-10-CM | POA: Diagnosis present

## 2019-04-20 DIAGNOSIS — J690 Pneumonitis due to inhalation of food and vomit: Secondary | ICD-10-CM

## 2019-04-20 DIAGNOSIS — J181 Lobar pneumonia, unspecified organism: Secondary | ICD-10-CM | POA: Diagnosis not present

## 2019-04-20 DIAGNOSIS — J9601 Acute respiratory failure with hypoxia: Secondary | ICD-10-CM

## 2019-04-20 DIAGNOSIS — Z79899 Other long term (current) drug therapy: Secondary | ICD-10-CM | POA: Diagnosis not present

## 2019-04-20 DIAGNOSIS — F17210 Nicotine dependence, cigarettes, uncomplicated: Secondary | ICD-10-CM | POA: Diagnosis not present

## 2019-04-20 DIAGNOSIS — I509 Heart failure, unspecified: Secondary | ICD-10-CM | POA: Diagnosis not present

## 2019-04-20 DIAGNOSIS — I472 Ventricular tachycardia: Secondary | ICD-10-CM | POA: Diagnosis not present

## 2019-04-20 DIAGNOSIS — J440 Chronic obstructive pulmonary disease with acute lower respiratory infection: Secondary | ICD-10-CM | POA: Diagnosis not present

## 2019-04-20 DIAGNOSIS — K219 Gastro-esophageal reflux disease without esophagitis: Secondary | ICD-10-CM | POA: Diagnosis not present

## 2019-04-20 DIAGNOSIS — I2489 Other forms of acute ischemic heart disease: Secondary | ICD-10-CM

## 2019-04-20 DIAGNOSIS — R590 Localized enlarged lymph nodes: Secondary | ICD-10-CM | POA: Diagnosis not present

## 2019-04-20 DIAGNOSIS — R0602 Shortness of breath: Secondary | ICD-10-CM | POA: Diagnosis not present

## 2019-04-20 DIAGNOSIS — M7989 Other specified soft tissue disorders: Secondary | ICD-10-CM | POA: Diagnosis not present

## 2019-04-20 DIAGNOSIS — E119 Type 2 diabetes mellitus without complications: Secondary | ICD-10-CM

## 2019-04-20 DIAGNOSIS — I252 Old myocardial infarction: Secondary | ICD-10-CM

## 2019-04-20 DIAGNOSIS — Z7982 Long term (current) use of aspirin: Secondary | ICD-10-CM

## 2019-04-20 DIAGNOSIS — E1165 Type 2 diabetes mellitus with hyperglycemia: Secondary | ICD-10-CM | POA: Diagnosis present

## 2019-04-20 DIAGNOSIS — Z7902 Long term (current) use of antithrombotics/antiplatelets: Secondary | ICD-10-CM | POA: Diagnosis not present

## 2019-04-20 DIAGNOSIS — I10 Essential (primary) hypertension: Secondary | ICD-10-CM | POA: Diagnosis not present

## 2019-04-20 DIAGNOSIS — I248 Other forms of acute ischemic heart disease: Secondary | ICD-10-CM | POA: Diagnosis not present

## 2019-04-20 DIAGNOSIS — Z20822 Contact with and (suspected) exposure to covid-19: Secondary | ICD-10-CM | POA: Diagnosis not present

## 2019-04-20 DIAGNOSIS — I5033 Acute on chronic diastolic (congestive) heart failure: Secondary | ICD-10-CM | POA: Diagnosis not present

## 2019-04-20 DIAGNOSIS — Z955 Presence of coronary angioplasty implant and graft: Secondary | ICD-10-CM | POA: Diagnosis not present

## 2019-04-20 DIAGNOSIS — J189 Pneumonia, unspecified organism: Secondary | ICD-10-CM

## 2019-04-20 DIAGNOSIS — R000 Tachycardia, unspecified: Secondary | ICD-10-CM | POA: Diagnosis not present

## 2019-04-20 DIAGNOSIS — I361 Nonrheumatic tricuspid (valve) insufficiency: Secondary | ICD-10-CM | POA: Diagnosis not present

## 2019-04-20 DIAGNOSIS — E872 Acidosis: Secondary | ICD-10-CM | POA: Diagnosis not present

## 2019-04-20 DIAGNOSIS — I340 Nonrheumatic mitral (valve) insufficiency: Secondary | ICD-10-CM

## 2019-04-20 DIAGNOSIS — R918 Other nonspecific abnormal finding of lung field: Secondary | ICD-10-CM | POA: Diagnosis not present

## 2019-04-20 DIAGNOSIS — J449 Chronic obstructive pulmonary disease, unspecified: Secondary | ICD-10-CM | POA: Diagnosis not present

## 2019-04-20 DIAGNOSIS — R0600 Dyspnea, unspecified: Secondary | ICD-10-CM | POA: Diagnosis not present

## 2019-04-20 LAB — CBC WITH DIFFERENTIAL/PLATELET
Abs Immature Granulocytes: 0.09 10*3/uL — ABNORMAL HIGH (ref 0.00–0.07)
Basophils Absolute: 0.1 10*3/uL (ref 0.0–0.1)
Basophils Relative: 1 %
Eosinophils Absolute: 0.4 10*3/uL (ref 0.0–0.5)
Eosinophils Relative: 4 %
HCT: 31.6 % — ABNORMAL LOW (ref 39.0–52.0)
Hemoglobin: 9.6 g/dL — ABNORMAL LOW (ref 13.0–17.0)
Immature Granulocytes: 1 %
Lymphocytes Relative: 10 %
Lymphs Abs: 0.9 10*3/uL (ref 0.7–4.0)
MCH: 27.3 pg (ref 26.0–34.0)
MCHC: 30.4 g/dL (ref 30.0–36.0)
MCV: 89.8 fL (ref 80.0–100.0)
Monocytes Absolute: 0.7 10*3/uL (ref 0.1–1.0)
Monocytes Relative: 8 %
Neutro Abs: 6.6 10*3/uL (ref 1.7–7.7)
Neutrophils Relative %: 76 %
Platelets: 265 10*3/uL (ref 150–400)
RBC: 3.52 MIL/uL — ABNORMAL LOW (ref 4.22–5.81)
RDW: 15.8 % — ABNORMAL HIGH (ref 11.5–15.5)
WBC: 8.7 10*3/uL (ref 4.0–10.5)
nRBC: 0.2 % (ref 0.0–0.2)

## 2019-04-20 LAB — GLUCOSE, CAPILLARY
Glucose-Capillary: 292 mg/dL — ABNORMAL HIGH (ref 70–99)
Glucose-Capillary: 195 mg/dL — ABNORMAL HIGH (ref 70–99)

## 2019-04-20 LAB — TSH
TSH: 0.926 u[IU]/mL (ref 0.350–4.500)
TSH: 0.905 u[IU]/mL (ref 0.350–4.500)

## 2019-04-20 LAB — COMPREHENSIVE METABOLIC PANEL
ALT: 39 U/L (ref 0–44)
AST: 25 U/L (ref 15–41)
Albumin: 2.8 g/dL — ABNORMAL LOW (ref 3.5–5.0)
Alkaline Phosphatase: 98 U/L (ref 38–126)
Anion gap: 10 (ref 5–15)
BUN: 16 mg/dL (ref 8–23)
CO2: 21 mmol/L — ABNORMAL LOW (ref 22–32)
Calcium: 8.3 mg/dL — ABNORMAL LOW (ref 8.9–10.3)
Chloride: 107 mmol/L (ref 98–111)
Creatinine, Ser: 1.21 mg/dL (ref 0.61–1.24)
GFR calc Af Amer: >60 mL/min (ref >60)
GFR calc non Af Amer: 58 mL/min — ABNORMAL LOW (ref >60)
Glucose, Bld: 153 mg/dL — ABNORMAL HIGH (ref 70–99)
Potassium: 3.7 mmol/L (ref 3.5–5.1)
Sodium: 138 mmol/L (ref 135–145)
Total Bilirubin: 0.7 mg/dL (ref 0.3–1.2)
Total Protein: 7.3 g/dL (ref 6.5–8.1)

## 2019-04-20 LAB — CBC
HCT: 29.7 % — ABNORMAL LOW (ref 39.0–52.0)
Hemoglobin: 9.3 g/dL — ABNORMAL LOW (ref 13.0–17.0)
MCH: 27.8 pg (ref 26.0–34.0)
MCHC: 31.3 g/dL (ref 30.0–36.0)
MCV: 88.7 fL (ref 80.0–100.0)
Platelets: 251 10*3/uL (ref 150–400)
RBC: 3.35 MIL/uL — ABNORMAL LOW (ref 4.22–5.81)
RDW: 15.7 % — ABNORMAL HIGH (ref 11.5–15.5)
WBC: 10 10*3/uL (ref 4.0–10.5)
nRBC: 0 % (ref 0.0–0.2)

## 2019-04-20 LAB — ECHOCARDIOGRAM COMPLETE
Height: 67 in
Weight: 3174.62 oz

## 2019-04-20 LAB — BLOOD GAS, ARTERIAL
Acid-base deficit: 2.3 mmol/L — ABNORMAL HIGH (ref 0.0–2.0)
Bicarbonate: 22.7 mmol/L (ref 20.0–28.0)
FIO2: 32
O2 Saturation: 89.3 %
Patient temperature: 36.7
pCO2 arterial: 29.6 mmHg — ABNORMAL LOW (ref 32.0–48.0)
pH, Arterial: 7.462 — ABNORMAL HIGH (ref 7.350–7.450)
pO2, Arterial: 59.1 mmHg — ABNORMAL LOW (ref 83.0–108.0)

## 2019-04-20 LAB — MRSA PCR SCREENING: MRSA by PCR: NEGATIVE

## 2019-04-20 LAB — BRAIN NATRIURETIC PEPTIDE: B Natriuretic Peptide: 580 pg/mL — ABNORMAL HIGH (ref 0.0–100.0)

## 2019-04-20 LAB — LACTIC ACID, PLASMA
Lactic Acid, Venous: 1.5 mmol/L (ref 0.5–1.9)
Lactic Acid, Venous: 2.7 mmol/L (ref 0.5–1.9)

## 2019-04-20 LAB — MAGNESIUM: Magnesium: 1.8 mg/dL (ref 1.7–2.4)

## 2019-04-20 LAB — TROPONIN I (HIGH SENSITIVITY)
Troponin I (High Sensitivity): 1053 ng/L (ref <18)
Troponin I (High Sensitivity): 1135 ng/L (ref <18)
Troponin I (High Sensitivity): 1022 ng/L (ref <18)
Troponin I (High Sensitivity): 1116 ng/L (ref <18)

## 2019-04-20 LAB — PROTIME-INR
INR: 1.3 — ABNORMAL HIGH (ref 0.8–1.2)
Prothrombin Time: 15.6 seconds — ABNORMAL HIGH (ref 11.4–15.2)

## 2019-04-20 LAB — STREP PNEUMONIAE URINARY ANTIGEN: Strep Pneumo Urinary Antigen: NEGATIVE

## 2019-04-20 LAB — CREATININE, SERUM
Creatinine, Ser: 1.22 mg/dL (ref 0.61–1.24)
GFR calc Af Amer: >60 mL/min (ref >60)
GFR calc non Af Amer: 58 mL/min — ABNORMAL LOW (ref >60)

## 2019-04-20 LAB — RESPIRATORY PANEL BY RT PCR (FLU A&B, COVID)
Influenza A by PCR: NEGATIVE
Influenza B by PCR: NEGATIVE
SARS Coronavirus 2 by RT PCR: NEGATIVE

## 2019-04-20 LAB — HEMOGLOBIN A1C
Hgb A1c MFr Bld: 7.6 % — ABNORMAL HIGH (ref 4.8–5.6)
Mean Plasma Glucose: 171.42 mg/dL

## 2019-04-20 LAB — D-DIMER, QUANTITATIVE: D-Dimer, Quant: 3.81 ug/mL-FEU — ABNORMAL HIGH (ref 0.00–0.50)

## 2019-04-20 IMAGING — US US EXTREM LOW VENOUS*R*
1 series · 14 of 24 positions shown · non-contrast
Comparison: None.

CLINICAL DATA: Swelling x5 days

EXAM:
RIGHT LOWER EXTREMITY VENOUS DOPPLER ULTRASOUND
TECHNIQUE: Gray-scale sonography with compression, as well as color and duplex
ultrasound, were performed to evaluate the deep venous system(s)
from the level of the common femoral vein through the popliteal and
proximal calf veins.

[Series 1: us extrem low venous*right* · 0.08mm/px · 14 of 33 slices shown]
[im 1/33]
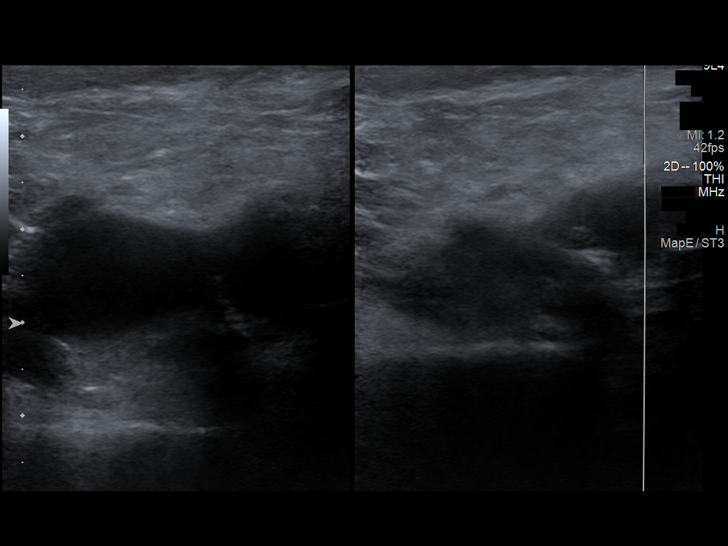
[im 3/33]
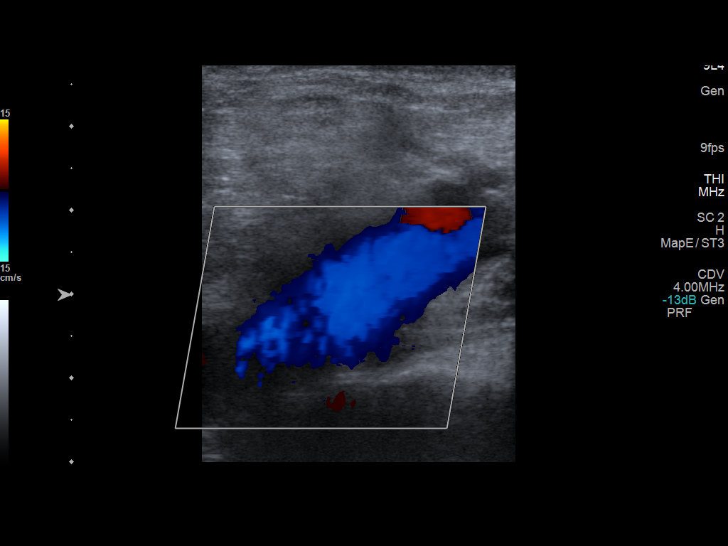
[im 6/33]
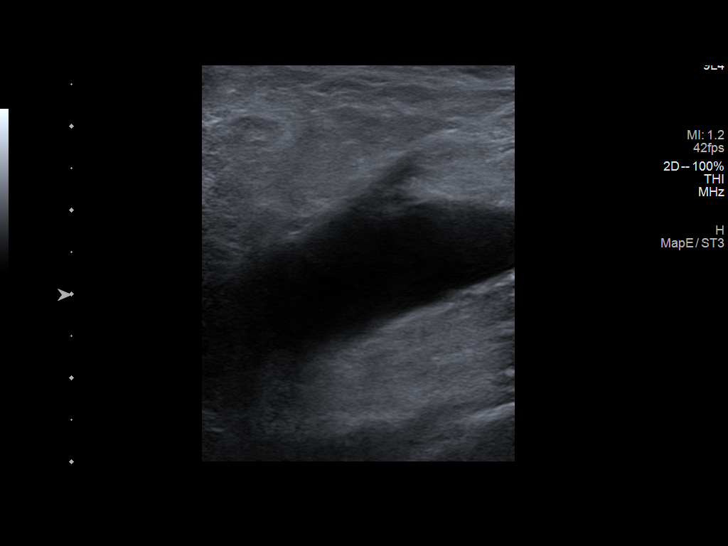
[im 9/33]
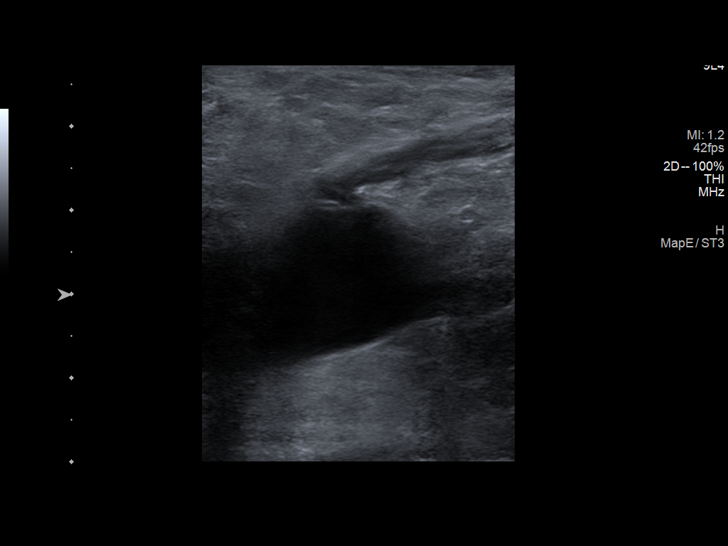
[im 10/33]
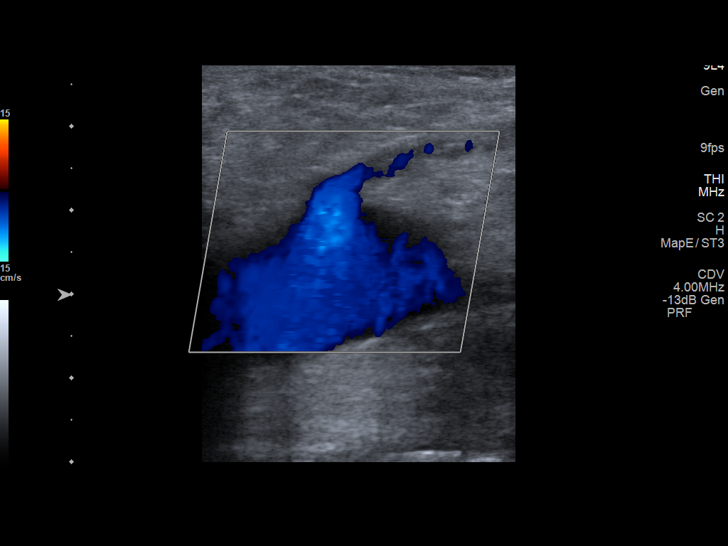
[im 13/33]
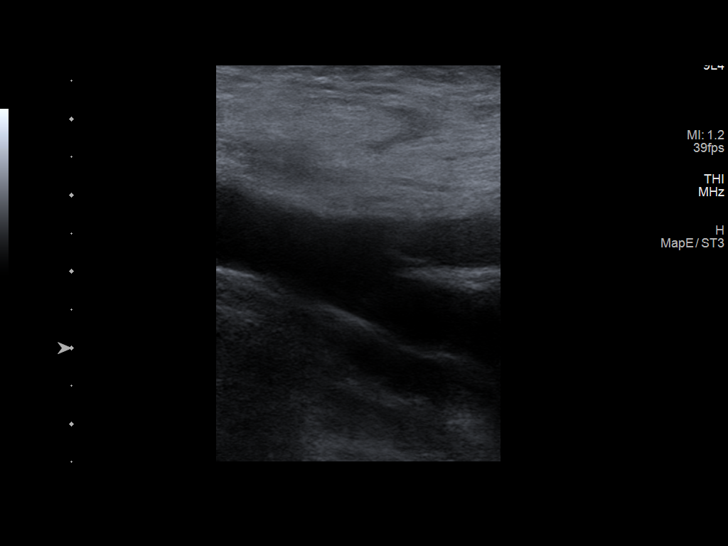
[im 16/33]
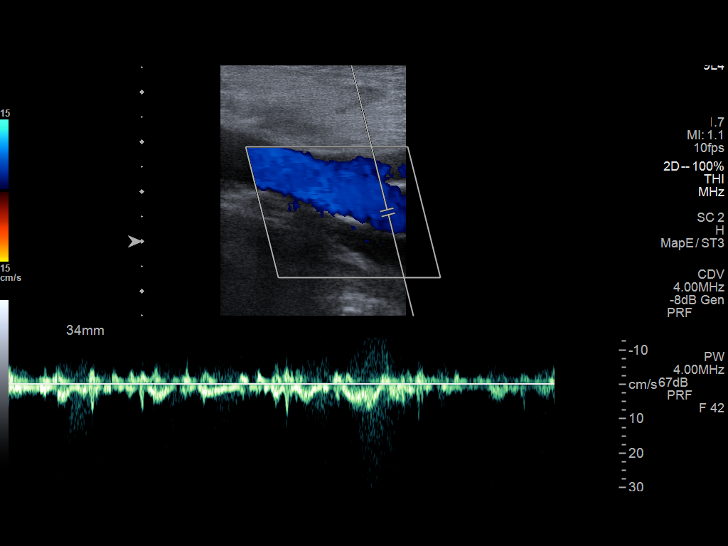
[im 17/33]
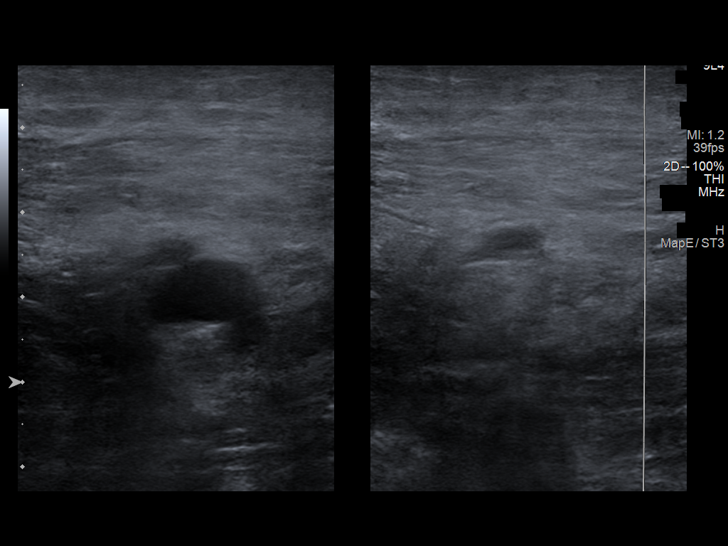
[im 20/33]
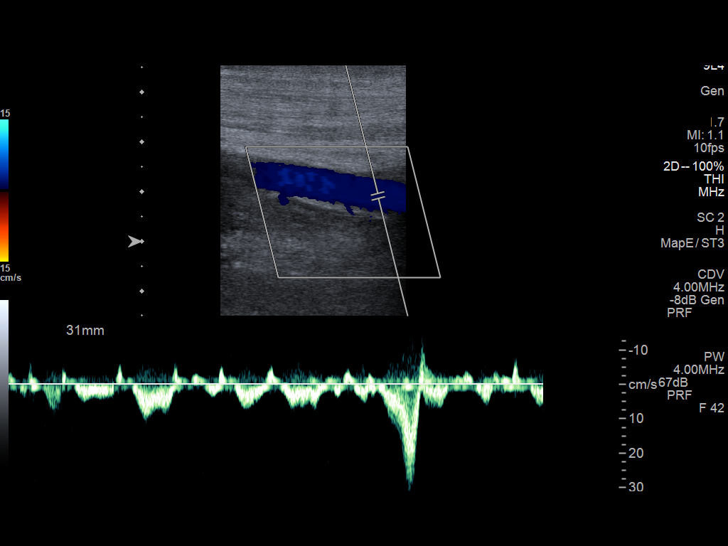
[im 23/33]
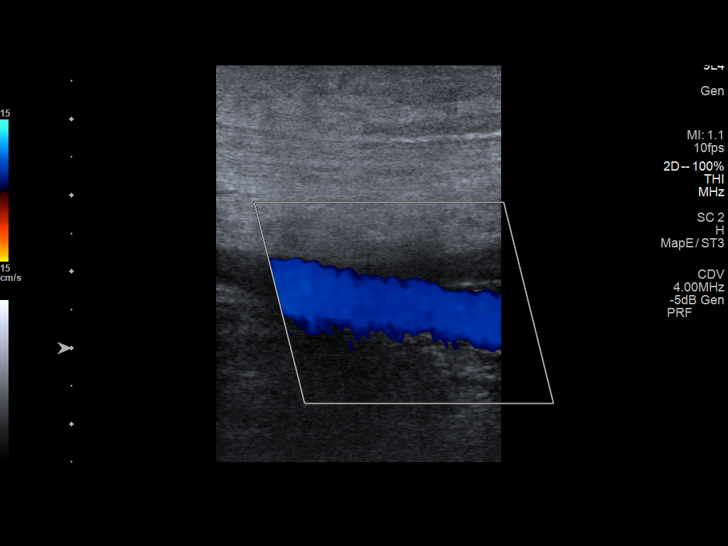
[im 26/33]
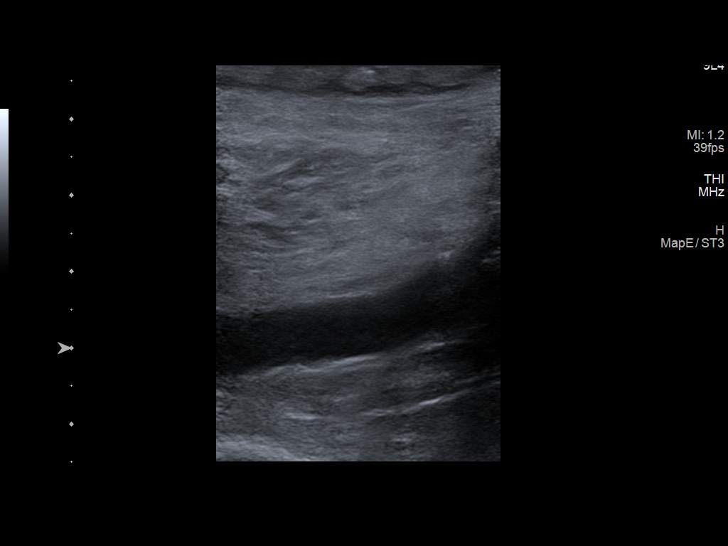
[im 27/33]
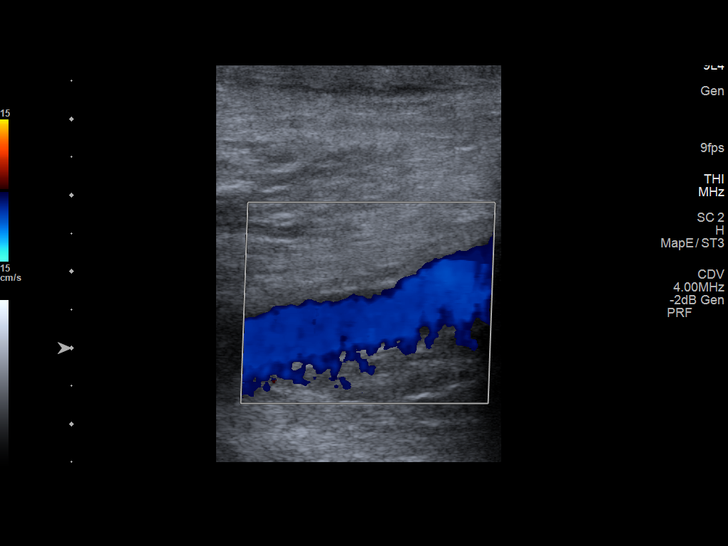
[im 30/33]
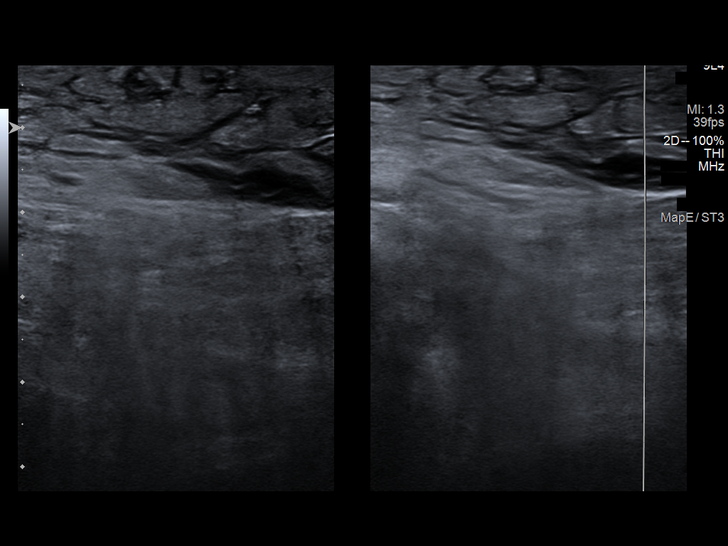
[im 33/33]
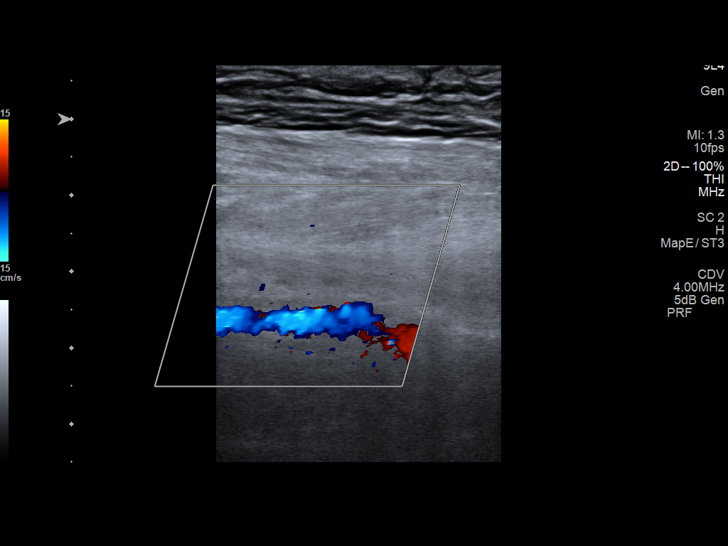

[14 of 24 positions shown; findings below may reference images not displayed]

FINDINGS: VENOUS

Normal compressibility of the common femoral, superficial femoral,
and popliteal veins, as well as the visualized calf veins.
Visualized portions of profunda femoral vein and great saphenous
vein unremarkable. No filling defects to suggest DVT on grayscale or
color Doppler imaging. Doppler waveforms show normal direction of
venous flow, normal respiratory phasicity and response to
augmentation.

Limited views of the contralateral common femoral vein are
unremarkable.

OTHER

Subcutaneous calf edema.

Limitations: none
IMPRESSION: No femoropopliteal DVT nor evidence of DVT within the visualized
calf veins.

If clinical symptoms are inconsistent or if there are persistent or
worsening symptoms, further imaging (possibly involving the iliac
veins) may be warranted.

## 2019-04-20 IMAGING — DX DG CHEST 1V PORT
1 series · 1 of 1 positions shown · non-contrast
Comparison: Portable exam [ZR] hours compared to [DATE]

CLINICAL DATA: Shortness of breath, history coronary artery
disease, hypertension, type II diabetes mellitus, COPD, smoker

EXAM:
PORTABLE CHEST 1 VIEW

[chest ap]
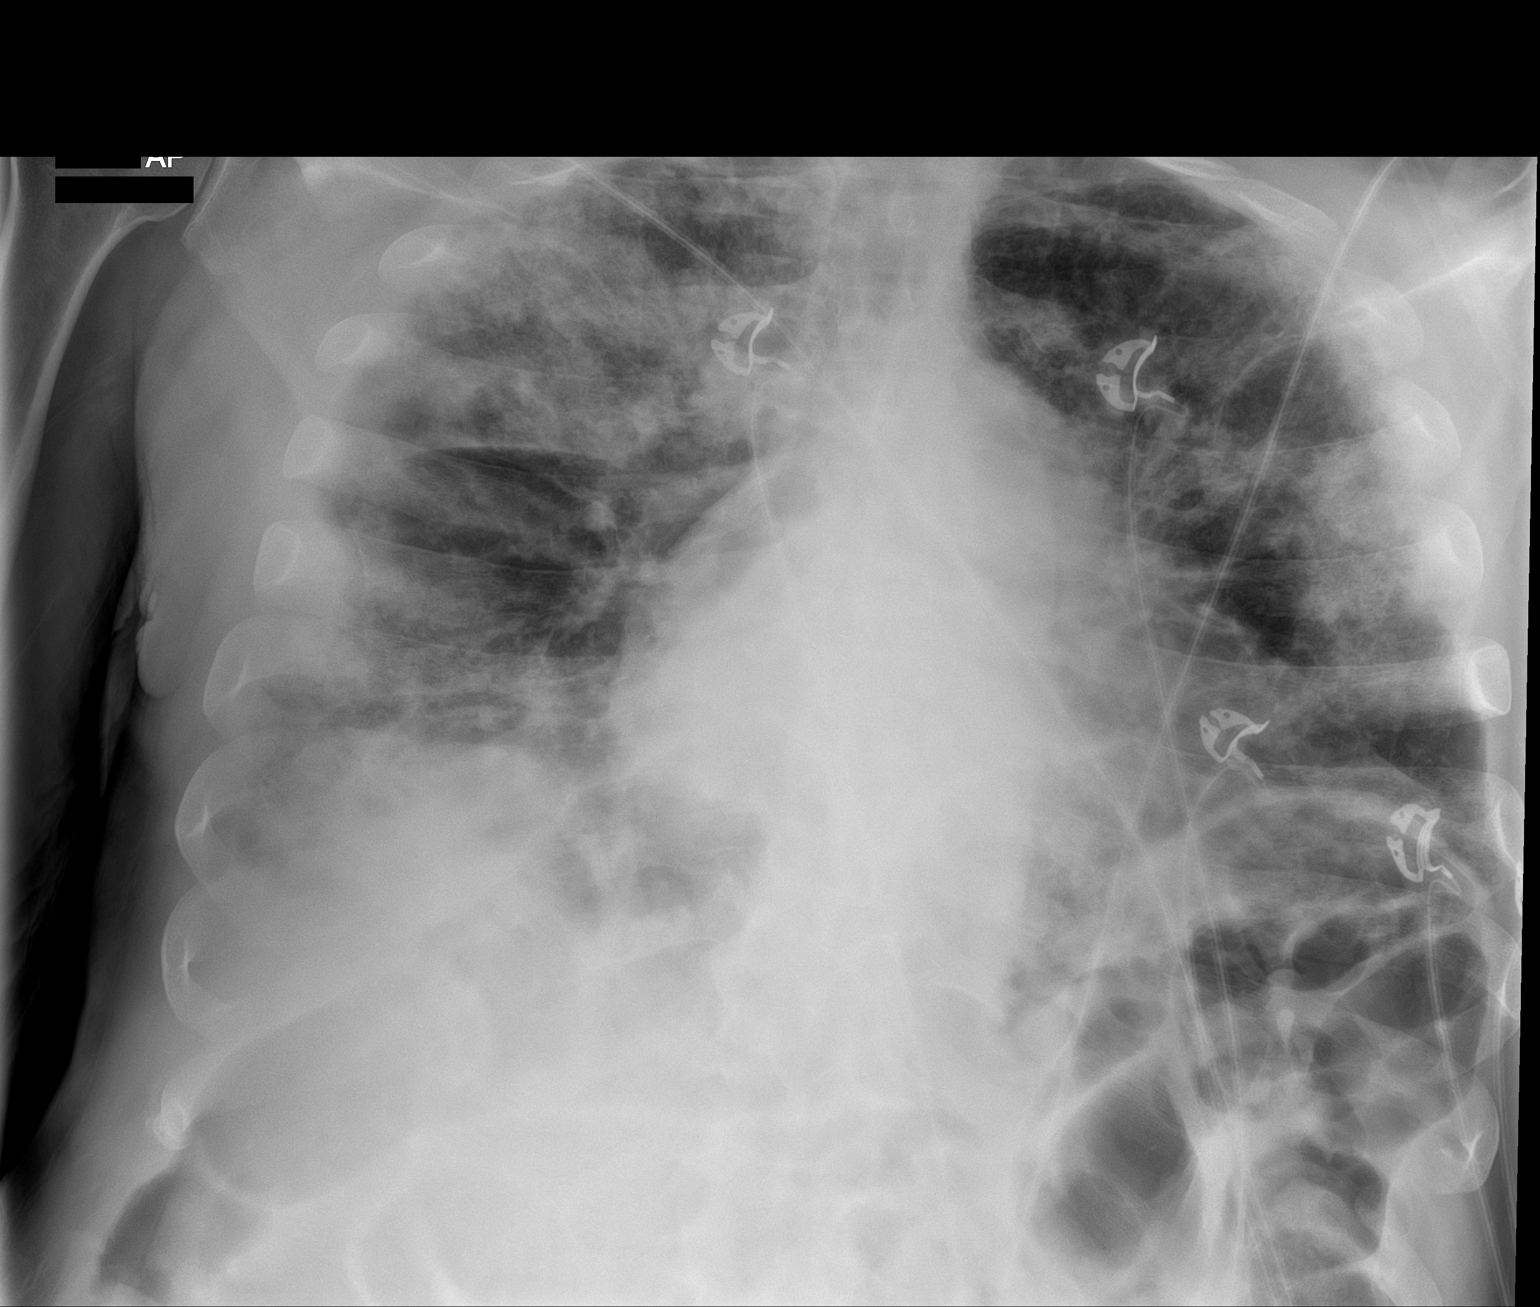

[1 of 1 positions shown; findings below may reference images not displayed]

FINDINGS: Lordotic positioning.

Enlargement of cardiac silhouette.

Mediastinal contours normal.

Patchy BILATERAL pulmonary infiltrates consistent with multifocal
pneumonia.

No pleural effusion or pneumothorax.

No acute osseous findings.
IMPRESSION: Patchy BILATERAL pulmonary infiltrates consistent with multifocal
pneumonia; question of [ZR] pneumonia is raised.

## 2019-04-20 IMAGING — CT CT ANGIO CHEST
2 of 6 series · 19 of 46 positions shown · IV contrast (Omnipaque or Isovue)
Comparison: None.

CLINICAL DATA: Shortness of breath.

EXAM:
CT ANGIOGRAPHY CHEST WITH CONTRAST
TECHNIQUE: Multidetector CT imaging of the chest was performed using the
standard protocol during bolus administration of intravenous
contrast. Multiplanar CT image reconstructions and MIPs were
obtained to evaluate the vascular anatomy.
CONTRAST:  100mL OMNIPAQUE IOHEXOL 350 MG/ML SOLN

[Series 5: pe axial thins · axial · 0.77mm/px · z∈[-414,-103]mm · 16 of 341 slices shown]
[im 15/341  lung]
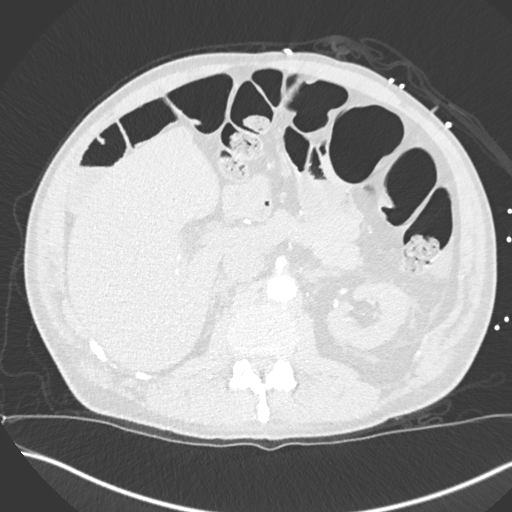
[im 45/341  soft-tissue]
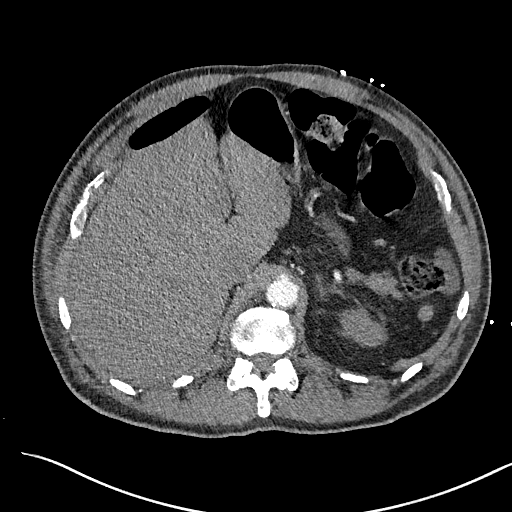
[im 60/341  lung]
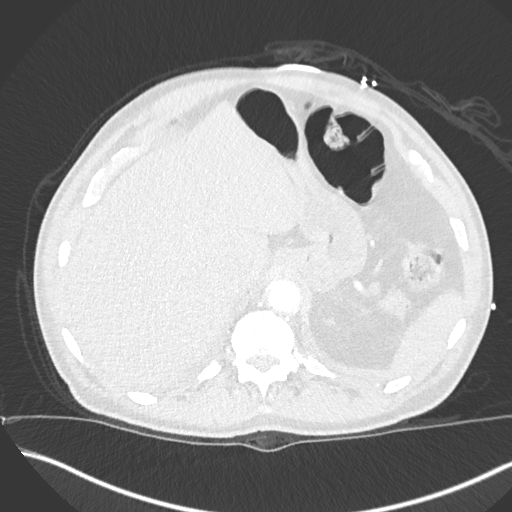
[im 74/341  soft-tissue]
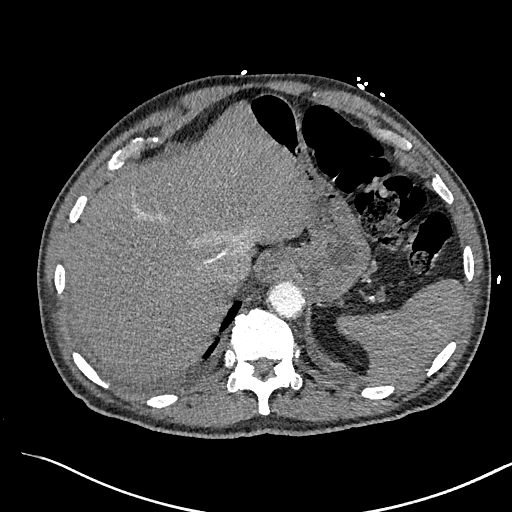
[im 104/341  lung]
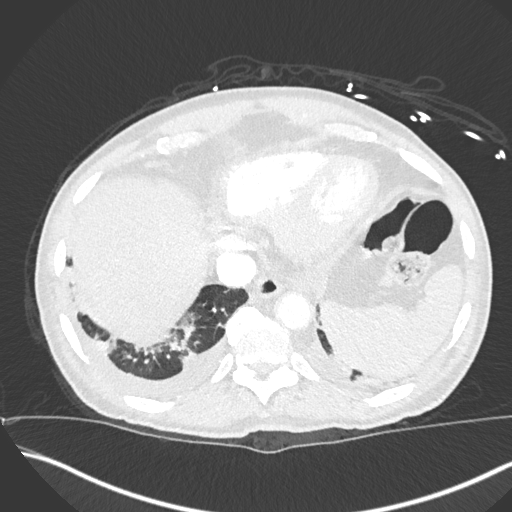
[im 119/341  soft-tissue]
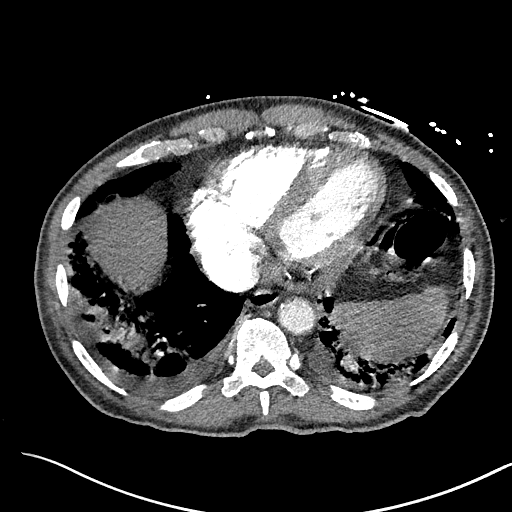
[im 134/341  lung]
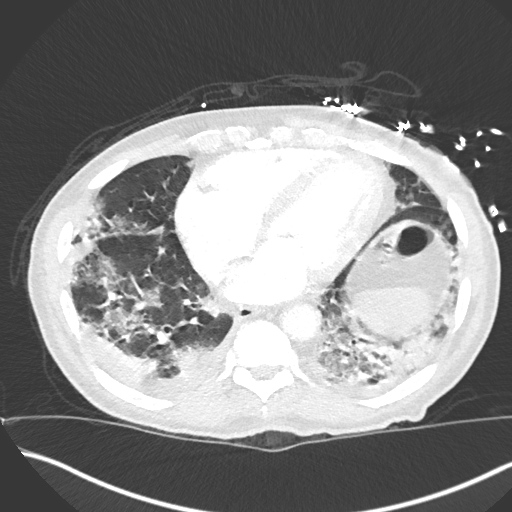
[im 163/341  soft-tissue]
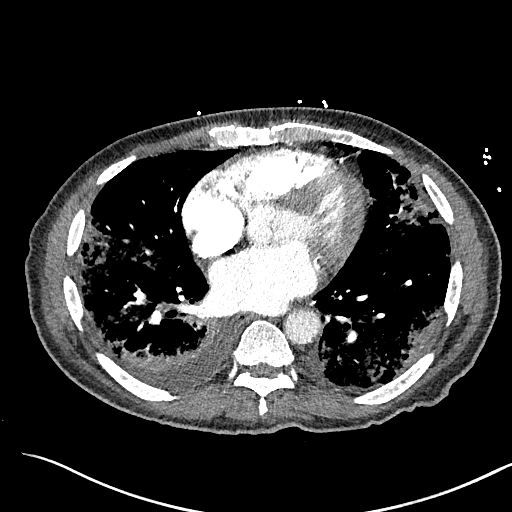
[im 178/341  lung]
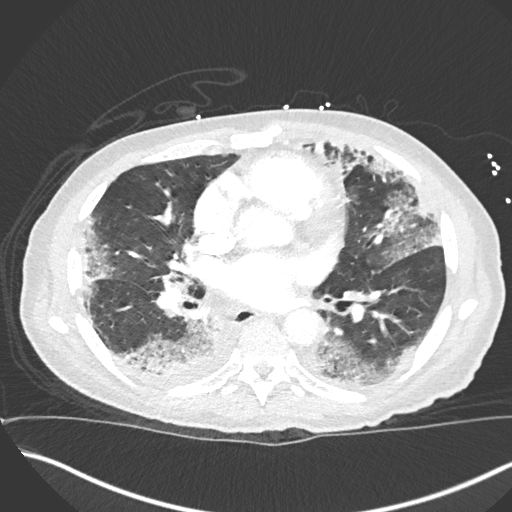
[im 207/341  soft-tissue]
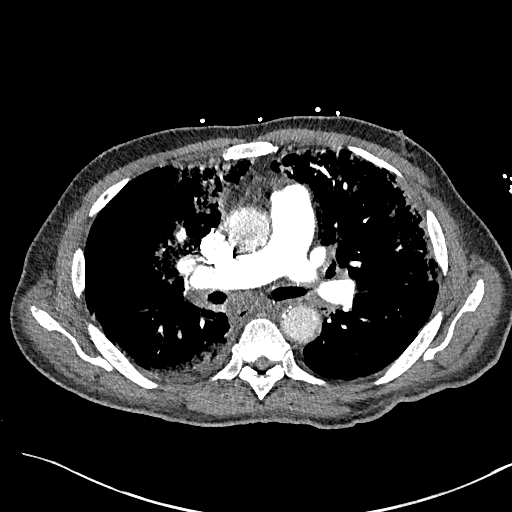
[im 222/341  lung]
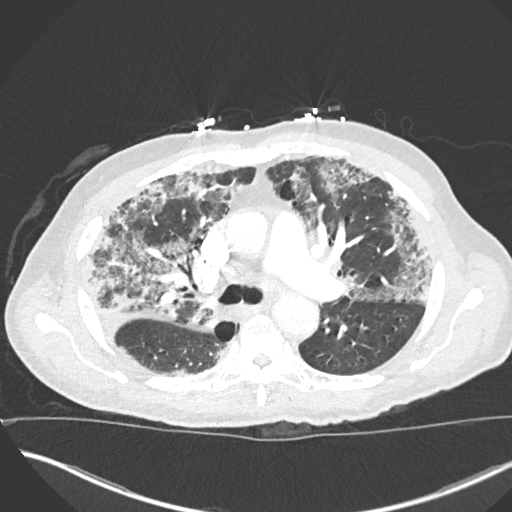
[im 237/341  soft-tissue]
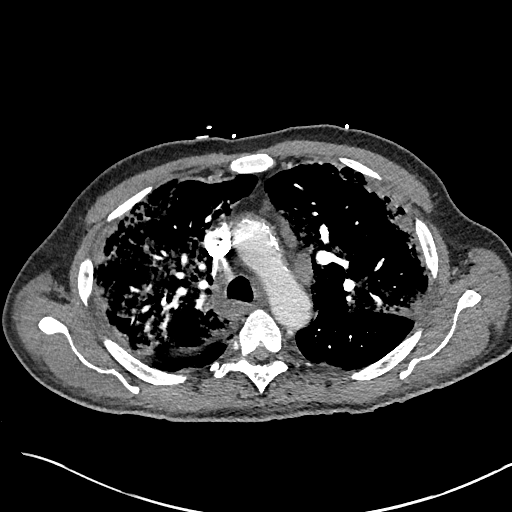
[im 267/341  lung]
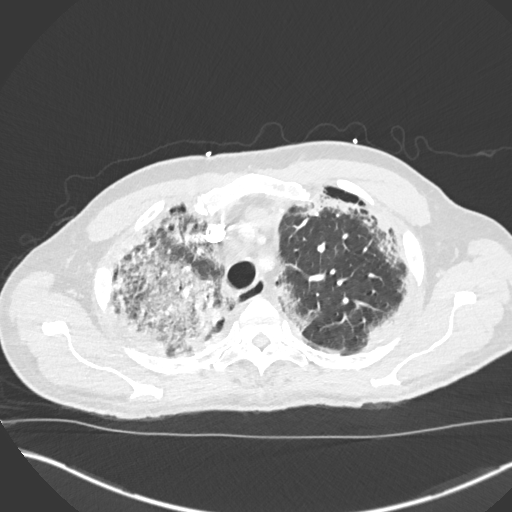
[im 281/341  soft-tissue]
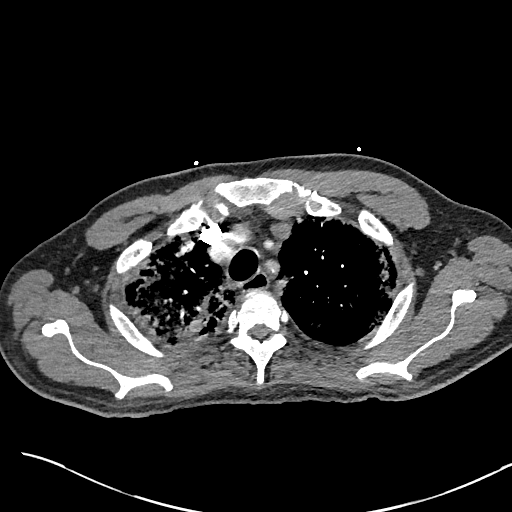
[im 296/341  lung]
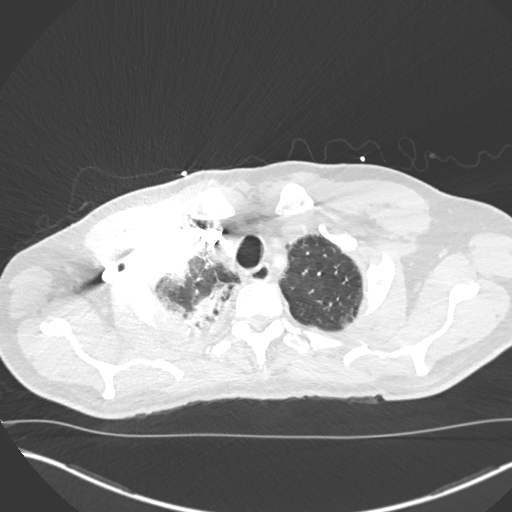
[im 326/341  soft-tissue]
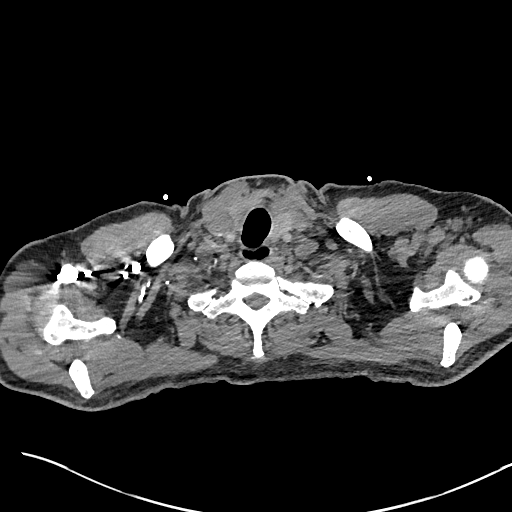

[Series 7: cor soft · coronal · 0.73mm/px · 3 of 157 slices shown]
[im 40/157  soft-tissue]
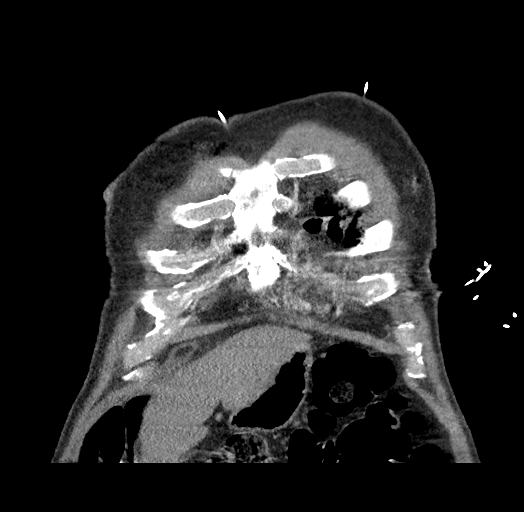
[im 79/157  soft-tissue]
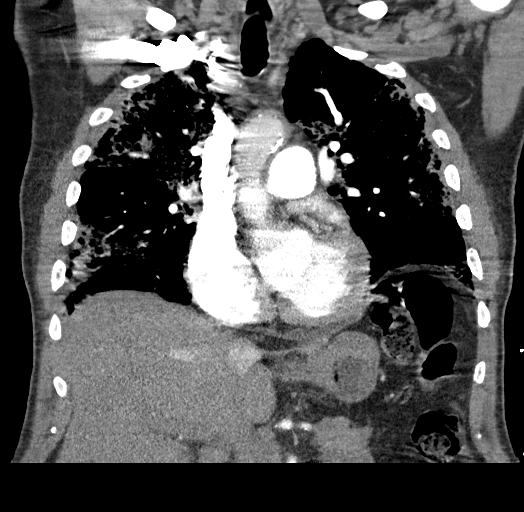
[im 118/157  soft-tissue]
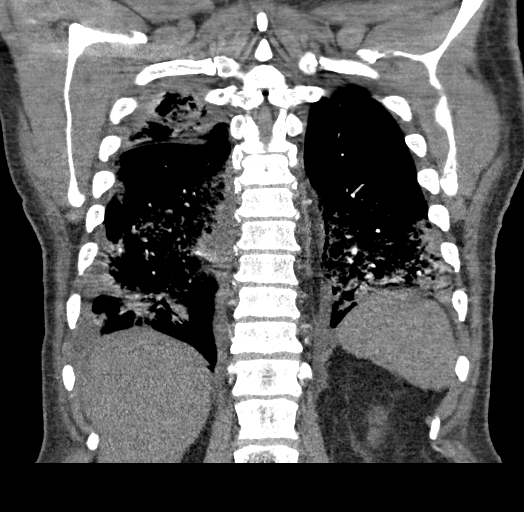

[19 of 46 positions shown; findings below may reference images not displayed]

FINDINGS: Cardiovascular: Satisfactory opacification of the pulmonary arteries
to the segmental level. No evidence of pulmonary embolism. Normal
heart size. No pericardial effusion. Atherosclerosis of thoracic
aorta is noted without aneurysm or dissection.

Mediastinum/Nodes: Thyroid gland and esophagus are unremarkable.
Enlarged pre-vascular a subcarinal adenopathy is noted which most
likely is inflammatory in etiology.

Lungs/Pleura: No pneumothorax is noted. Minimal bilateral pleural
effusions are noted. Multiple airspace opacities are noted
throughout both lungs most consistent with multifocal pneumonia,
most likely of viral etiology.

Upper Abdomen: No acute abnormality.

Musculoskeletal: No chest wall abnormality. No acute or significant
osseous findings.

Review of the MIP images confirms the above findings.
IMPRESSION: 1. No definite evidence of pulmonary embolus.
2. Multiple airspace opacities are noted throughout both lungs most
consistent with multifocal pneumonia, most likely of viral etiology.
3. Minimal bilateral pleural effusions are noted.
4. Enlarged mediastinal adenopathy is noted which most likely is
inflammatory in etiology.

Aortic Atherosclerosis ([SR]-[SR]).

## 2019-04-20 MED ORDER — FUROSEMIDE 10 MG/ML IJ SOLN
40.0000 mg | Freq: Once | INTRAMUSCULAR | Status: AC
  Administered 2019-04-20: 40 mg via INTRAVENOUS
  Filled 2019-04-20: qty 4

## 2019-04-20 MED ORDER — ORAL CARE MOUTH RINSE
15.0000 mL | Freq: Two times a day (BID) | OROMUCOSAL | Status: DC
  Administered 2019-04-20 – 2019-04-23 (×3): 15 mL via OROMUCOSAL

## 2019-04-20 MED ORDER — HEPARIN BOLUS VIA INFUSION
4000.0000 [IU] | Freq: Once | INTRAVENOUS | Status: AC
  Administered 2019-04-20: 4000 [IU] via INTRAVENOUS
  Filled 2019-04-20: qty 4000

## 2019-04-20 MED ORDER — ONDANSETRON HCL 4 MG PO TABS: 4.0000 mg | ORAL_TABLET | Freq: Four times a day (QID) | ORAL | Status: DC | PRN

## 2019-04-20 MED ORDER — HEPARIN SODIUM (PORCINE) 5000 UNIT/ML IJ SOLN: 5000.0000 [IU] | Freq: Three times a day (TID) | INTRAMUSCULAR | Status: DC

## 2019-04-20 MED ORDER — INSULIN ASPART 100 UNIT/ML ~~LOC~~ SOLN
0.0000 [IU] | Freq: Three times a day (TID) | SUBCUTANEOUS | Status: DC
  Administered 2019-04-20: 8 [IU] via SUBCUTANEOUS
  Administered 2019-04-21: 3 [IU] via SUBCUTANEOUS
  Administered 2019-04-21 – 2019-04-22 (×4): 5 [IU] via SUBCUTANEOUS
  Administered 2019-04-22: 8 [IU] via SUBCUTANEOUS
  Administered 2019-04-23 (×2): 5 [IU] via SUBCUTANEOUS

## 2019-04-20 MED ORDER — LEVALBUTEROL HCL 1.25 MG/0.5ML IN NEBU
1.2500 mg | INHALATION_SOLUTION | Freq: Four times a day (QID) | RESPIRATORY_TRACT | Status: DC
  Administered 2019-04-20 – 2019-04-22 (×7): 1.25 mg via RESPIRATORY_TRACT
  Filled 2019-04-20 (×7): qty 0.5

## 2019-04-20 MED ORDER — SODIUM CHLORIDE 0.9 % IV SOLN
1.0000 g | INTRAVENOUS | Status: DC
  Administered 2019-04-21 – 2019-04-23 (×3): 1 g via INTRAVENOUS
  Filled 2019-04-20 (×3): qty 10

## 2019-04-20 MED ORDER — SODIUM CHLORIDE 0.9 % IV SOLN: 250.0000 mL | INTRAVENOUS | Status: DC | PRN

## 2019-04-20 MED ORDER — DEXAMETHASONE SODIUM PHOSPHATE 10 MG/ML IJ SOLN
6.0000 mg | Freq: Once | INTRAMUSCULAR | Status: AC
  Administered 2019-04-20: 09:00:00 6 mg via INTRAVENOUS
  Filled 2019-04-20: qty 1

## 2019-04-20 MED ORDER — HEPARIN (PORCINE) 25000 UT/250ML-% IV SOLN
1350.0000 [IU]/h | INTRAVENOUS | Status: DC
  Administered 2019-04-20: 1200 [IU]/h via INTRAVENOUS
  Administered 2019-04-21: 1350 [IU]/h via INTRAVENOUS
  Filled 2019-04-20 (×2): qty 250

## 2019-04-20 MED ORDER — AMLODIPINE BESYLATE 5 MG PO TABS
10.0000 mg | ORAL_TABLET | Freq: Every day | ORAL | Status: DC
  Administered 2019-04-20: 10 mg via ORAL
  Filled 2019-04-20: qty 2

## 2019-04-20 MED ORDER — IPRATROPIUM-ALBUTEROL 0.5-2.5 (3) MG/3ML IN SOLN
3.0000 mL | Freq: Once | RESPIRATORY_TRACT | Status: AC
  Administered 2019-04-20: 13:00:00 3 mL via RESPIRATORY_TRACT
  Filled 2019-04-20: qty 3

## 2019-04-20 MED ORDER — SODIUM CHLORIDE 0.9 % IV SOLN
500.0000 mg | Freq: Once | INTRAVENOUS | Status: AC
  Administered 2019-04-20: 500 mg via INTRAVENOUS
  Filled 2019-04-20: qty 500

## 2019-04-20 MED ORDER — INSULIN ASPART 100 UNIT/ML ~~LOC~~ SOLN: 0.0000 [IU] | Freq: Every day | SUBCUTANEOUS | Status: DC

## 2019-04-20 MED ORDER — SODIUM CHLORIDE 0.9% FLUSH: 3.0000 mL | INTRAVENOUS | Status: DC | PRN

## 2019-04-20 MED ORDER — IPRATROPIUM BROMIDE 0.02 % IN SOLN
0.5000 mg | Freq: Four times a day (QID) | RESPIRATORY_TRACT | Status: DC
  Administered 2019-04-20 – 2019-04-22 (×7): 0.5 mg via RESPIRATORY_TRACT
  Filled 2019-04-20 (×7): qty 2.5

## 2019-04-20 MED ORDER — FUROSEMIDE 10 MG/ML IJ SOLN
40.0000 mg | Freq: Two times a day (BID) | INTRAMUSCULAR | Status: DC
  Administered 2019-04-20 – 2019-04-21 (×3): 40 mg via INTRAVENOUS
  Filled 2019-04-20 (×3): qty 4

## 2019-04-20 MED ORDER — NICOTINE 14 MG/24HR TD PT24
14.0000 mg | MEDICATED_PATCH | Freq: Every day | TRANSDERMAL | Status: DC
  Filled 2019-04-20 (×2): qty 1

## 2019-04-20 MED ORDER — DM-GUAIFENESIN ER 30-600 MG PO TB12
1.0000 | ORAL_TABLET | Freq: Two times a day (BID) | ORAL | Status: DC
  Administered 2019-04-20 – 2019-04-23 (×6): 1 via ORAL
  Filled 2019-04-20 (×6): qty 1

## 2019-04-20 MED ORDER — ALBUTEROL SULFATE HFA 108 (90 BASE) MCG/ACT IN AERS
4.0000 | INHALATION_SPRAY | Freq: Once | RESPIRATORY_TRACT | Status: AC
  Administered 2019-04-20: 4 via RESPIRATORY_TRACT
  Filled 2019-04-20: qty 6.7

## 2019-04-20 MED ORDER — ACETAMINOPHEN 650 MG RE SUPP: 650.0000 mg | Freq: Four times a day (QID) | RECTAL | Status: DC | PRN

## 2019-04-20 MED ORDER — DEXTROSE 5 % IV SOLN
250.0000 mg | INTRAVENOUS | Status: DC
  Filled 2019-04-20 (×2): qty 250

## 2019-04-20 MED ORDER — IOHEXOL 350 MG/ML SOLN
100.0000 mL | Freq: Once | INTRAVENOUS | Status: AC | PRN
  Administered 2019-04-20: 11:00:00 100 mL via INTRAVENOUS

## 2019-04-20 MED ORDER — IPRATROPIUM-ALBUTEROL 0.5-2.5 (3) MG/3ML IN SOLN
3.0000 mL | Freq: Four times a day (QID) | RESPIRATORY_TRACT | Status: DC
  Administered 2019-04-20: 3 mL via RESPIRATORY_TRACT
  Filled 2019-04-20: qty 3

## 2019-04-20 MED ORDER — ACETAMINOPHEN 325 MG PO TABS: 650.0000 mg | ORAL_TABLET | Freq: Four times a day (QID) | ORAL | Status: DC | PRN

## 2019-04-20 MED ORDER — ATORVASTATIN CALCIUM 20 MG PO TABS
20.0000 mg | ORAL_TABLET | Freq: Every day | ORAL | Status: DC
  Administered 2019-04-20 – 2019-04-22 (×3): 20 mg via ORAL
  Filled 2019-04-20 (×3): qty 1

## 2019-04-20 MED ORDER — BUDESONIDE 0.5 MG/2ML IN SUSP
0.5000 mg | Freq: Two times a day (BID) | RESPIRATORY_TRACT | Status: DC
  Administered 2019-04-20 – 2019-04-23 (×7): 0.5 mg via RESPIRATORY_TRACT
  Filled 2019-04-20 (×7): qty 2

## 2019-04-20 MED ORDER — ASPIRIN 81 MG PO CHEW
324.0000 mg | CHEWABLE_TABLET | Freq: Once | ORAL | Status: AC
  Administered 2019-04-20: 324 mg via ORAL
  Filled 2019-04-20: qty 4

## 2019-04-20 MED ORDER — CLOPIDOGREL BISULFATE 75 MG PO TABS
75.0000 mg | ORAL_TABLET | Freq: Every day | ORAL | Status: DC
  Administered 2019-04-20 – 2019-04-23 (×4): 75 mg via ORAL
  Filled 2019-04-20 (×4): qty 1

## 2019-04-20 MED ORDER — ONDANSETRON HCL 4 MG/2ML IJ SOLN: 4.0000 mg | Freq: Four times a day (QID) | INTRAMUSCULAR | Status: DC | PRN

## 2019-04-20 MED ORDER — METHYLPREDNISOLONE SODIUM SUCC 125 MG IJ SOLR
60.0000 mg | Freq: Two times a day (BID) | INTRAMUSCULAR | Status: DC
  Administered 2019-04-21 – 2019-04-23 (×6): 60 mg via INTRAVENOUS
  Filled 2019-04-20 (×7): qty 2

## 2019-04-20 MED ORDER — METOPROLOL TARTRATE 5 MG/5ML IV SOLN
5.0000 mg | Freq: Once | INTRAVENOUS | Status: AC
  Administered 2019-04-20: 5 mg via INTRAVENOUS
  Filled 2019-04-20: qty 5

## 2019-04-20 MED ORDER — NITROGLYCERIN 0.4 MG SL SUBL: 0.4000 mg | SUBLINGUAL_TABLET | SUBLINGUAL | Status: DC | PRN

## 2019-04-20 MED ORDER — SODIUM CHLORIDE 0.9% FLUSH
3.0000 mL | Freq: Two times a day (BID) | INTRAVENOUS | Status: DC
  Administered 2019-04-20 – 2019-04-23 (×7): 3 mL via INTRAVENOUS

## 2019-04-20 MED ORDER — SODIUM CHLORIDE 0.9 % IV SOLN
1.0000 g | Freq: Once | INTRAVENOUS | Status: AC
  Administered 2019-04-20: 1 g via INTRAVENOUS
  Filled 2019-04-20: qty 10

## 2019-04-20 MED ORDER — PANTOPRAZOLE SODIUM 40 MG PO TBEC
40.0000 mg | DELAYED_RELEASE_TABLET | Freq: Every day | ORAL | Status: DC
  Administered 2019-04-20 – 2019-04-23 (×4): 40 mg via ORAL
  Filled 2019-04-20 (×4): qty 1

## 2019-04-20 MED ORDER — CHLORHEXIDINE GLUCONATE CLOTH 2 % EX PADS
6.0000 | MEDICATED_PAD | Freq: Every day | CUTANEOUS | Status: DC
  Administered 2019-04-20 – 2019-04-22 (×3): 6 via TOPICAL

## 2019-04-20 MED ORDER — ASPIRIN 81 MG PO CHEW
81.0000 mg | CHEWABLE_TABLET | Freq: Every day | ORAL | Status: DC
  Administered 2019-04-21 – 2019-04-23 (×3): 81 mg via ORAL
  Filled 2019-04-20 (×3): qty 1

## 2019-04-20 MED ORDER — METHYLPREDNISOLONE SODIUM SUCC 125 MG IJ SOLR
80.0000 mg | Freq: Once | INTRAMUSCULAR | Status: DC
  Filled 2019-04-20: qty 2

## 2019-04-20 NOTE — Progress Notes (Signed)
CRITICAL VALUE ALERT  Critical Value:  Lactic acid: 2.7  Date & Time Notied:  04/20/2019 @ 1642  Provider Notified: Dr. Dyann Kief  Orders Received/Actions taken: no new orders at this time

## 2019-04-20 NOTE — H&P (Signed)
History and Physical    Peter Lambert Few [id number] DOB: 12-01-1945 DOA: 04/20/2019  Referring MD/NP/PA: Dr. Wyvonnia Dusky PCP: Redmond School, MD  Patient coming from: Home  Chief Complaint: Shortness of breath, wheezing and lower extremity swelling.  HPI: Peter Lambert is a 74 y.o. male with a past medical history significant for tobacco abuse, hypertension, hyperlipidemia, history of coronary disease/myocardial infarction in the past, type 2 diabetes mellitus and gastroesophageal reflux disease; who presented to the hospital secondary to approximately 7-8 days of worsening shortness of breath.  Patient has been seen at the office of his PCP where he received 2 shots for what appears to be COPD exacerbation without significant improvement and continues worsening in his breathing status.  Patient reports some lower extremity edema, orthopnea and short of breath on exertion.  He expressed increased wheezing and no response to his home bronchodilator inhaler.  Patient denies any fever, chills, rhinorrhea, chest pain, abdominal pain, dysuria, hematuria, melena, hematochezia, headaches, focal weakness or any other complaints. In the ED he was found to be hypoxic in the high 80s on room air and very tachypneic with diffuse wheezing.  Covid test was negative.  Chest x-ray demonstrated bilateral patchy infiltrates and congestion; troponin significantly elevated in the 1000 range and also elevated BNP.  He was not febrile and with normal WBCs.  Renal function within normal limits.  Patient has a lactic acid of 2.7.  Cultures retrieved, cardiology service consulted, IV antibiotics started and one-time dose of Lasix given.  Patient also received albuterol nebulization and steroids. [facility] contacted to admit patient for further evaluation and management.    Maintaining good O2 sat on 4 L nasal cannula.  Past Medical/Surgical History: Past Medical History:  Diagnosis Date  . Coronary artery disease   .  Hyperlipidemia   . Hypertension   . Hypertension   . MI, old   . Peripheral arterial disease ([place])   . Tobacco abuse   . Type 2 diabetes mellitus ([place])     Past Surgical History:  Procedure Laterality Date  . CARDIAC SURGERY    . CORONARY STENT PLACEMENT      Social History:  reports that he has been smoking cigarettes. He has a 26.00 pack-year smoking history. He has never used smokeless tobacco. He reports that he does not drink alcohol or use drugs.  Allergies: No Known Allergies  Family History:  Family History  Problem Relation Age of Onset  . Cancer Mother     Prior to Admission medications   Medication Sig Start Date End Date Taking? Authorizing Provider  albuterol (PROVENTIL HFA;VENTOLIN HFA) 108 (90 Base) MCG/ACT inhaler Inhale 2 puffs into the lungs every 4 (four) hours as needed for wheezing or shortness of breath. 04/10/16  Yes Horton, Barbette Hair, MD  amLODipine (NORVASC) 10 MG tablet Take 1 tablet (10 mg total) by mouth daily. 05/29/17  Yes Lorretta Harp, MD  clopidogrel (PLAVIX) 75 MG tablet Take 75 mg by mouth daily. 09/21/18  Yes [provider]  doxazosin (CARDURA) 2 MG tablet Take 2 mg by mouth daily. 02/18/19  Yes [provider]  doxycycline (VIBRAMYCIN) 100 MG capsule Take 100 mg by mouth 2 (two) times daily. 04/14/19  Yes [provider]  indomethacin (INDOCIN) 25 MG capsule Take 1 capsule (25 mg total) by mouth 3 (three) times daily as needed. 02/27/18  Yes Veryl Speak, MD  KLOR-CON M20 20 MEQ tablet TAKE 1 TABLET BY MOUTH EVERY DAY 10/03/18  Yes Quay Burow  J, MD  nitroGLYCERIN (NITROSTAT) 0.4 MG SL tablet Place 1 tablet (0.4 mg total) under the tongue every 5 (five) minutes as needed for chest pain. Take 03/13/19  Yes Lorretta Harp, MD  aspirin 81 MG chewable tablet Chew 81 mg by mouth daily.      [provider]  atorvastatin (LIPITOR) 20 MG tablet Take 20 mg by mouth daily. 02/19/19   [provider]    colchicine 0.6 MG tablet TAKE 1 TAB NOW, 1 TAB IN 90 MINUTES, THEN 1 TAB TWICE A DAY UNTIL SYMPTOMS RESOLVE. 05/23/18   [provider]  ferrous sulfate 325 (65 FE) MG tablet Take 325 mg by mouth 3 (three) times daily. 05/13/17   [provider]  potassium chloride (KLOR-CON) 20 MEQ packet TAKE 1 TABLET BY MOUTH EVERY DAY 12/24/12 07/14/13  Lorretta Harp, MD    Review of Systems:  Negative except as otherwise mentioned in HPI.  Physical Exam: Vitals:   04/20/19 1629 04/20/19 1634 04/20/19 1643 04/20/19 1700  BP: (!) 111/58   (!) 101/50  Pulse:  (!) 102  97  Resp:  (!) 34  (!) 27  Temp:  98.1 F (36.7 C)    TempSrc:  Oral    SpO2:  (!) 89% 94% 93%  Weight:      Height:       Constitutional: Afebrile, denies chest pain; no nausea, no vomiting.  Mild respiratory distress and difficulty speaking in full sentences.  Sprays nonproductive cough and is using 4 L nasal cannula supplementation currently. Eyes: PERRL, lids and conjunctivae normal; no nystagmus. ENMT: Mucous membranes are moist. Posterior pharynx clear of any exudate or lesions. Neck: normal, supple, no masses, no thyromegaly. Respiratory: Fair air movement bilaterally; no using accessory muscle.  Positive tachypnea; positive expiratory wheezing and bibasilar rales on exam.   Cardiovascular: Sinus tachycardia, no rubs, no gallops, no JVD appreciated on exam.  1+ edema lower extremity bilaterally.   Abdomen: no tenderness, no masses palpated. No hepatosplenomegaly. Bowel sounds positive.  Musculoskeletal: no clubbing / cyanosis. No joint deformity upper and lower extremities. Good ROM, no contractures. Normal muscle tone.  Skin: no rashes, lesions, ulcers. No induration Neurologic: CN 2-12 grossly intact. Sensation intact, DTR normal. Strength 5/5 in all 4.  Psychiatric: Normal judgment and insight. Alert and oriented x 3. Normal mood.    Labs on Admission: I have personally reviewed the following labs and  imaging studies  CBC: Recent Labs  Lab 04/20/19 0846 04/20/19 1206  WBC 8.7 10.0  NEUTROABS 6.6  --   HGB 9.6* 9.3*  HCT 31.6* 29.7*  MCV 89.8 88.7  PLT 265 [id number]   Basic Metabolic Panel: Recent Labs  Lab 04/20/19 0846 04/20/19 1206 04/20/19 1455  NA 138  --   --   K 3.7  --   --   CL 107  --   --   CO2 21*  --   --   GLUCOSE 153*  --   --   BUN 16  --   --   CREATININE 1.21 1.22  --   CALCIUM 8.3*  --   --   MG  --   --  1.8   GFR: Estimated Creatinine Clearance: 55.1 mL/min (by C-G formula based on SCr of 1.22 mg/dL).   Liver Function Tests: Recent Labs  Lab 04/20/19 0846  AST 25  ALT 39  ALKPHOS 98  BILITOT 0.7  PROT 7.3  ALBUMIN 2.8*   Coagulation Profile:  Recent Labs  Lab 04/20/19 0846  INR 1.3*   HbA1C: Recent Labs    04/20/19 1455  HGBA1C 7.6*   CBG: Recent Labs  Lab 04/20/19 1628  GLUCAP 292*   Thyroid Function Tests: Recent Labs    04/20/19 1200  TSH 0.905  0.926    Recent Results (from the past 240 hour(s))  Respiratory Panel by RT PCR (Flu A&B, Covid) - Nasopharyngeal Swab     Status: None   Collection Time: 04/20/19  9:28 AM   Specimen: Nasopharyngeal Swab  Result Value Ref Range Status   SARS Coronavirus 2 by RT PCR NEGATIVE NEGATIVE Final    Comment: (NOTE) SARS-CoV-2 target nucleic acids are NOT DETECTED. The SARS-CoV-2 RNA is generally detectable in upper respiratoy specimens during the acute phase of infection. The lowest concentration of SARS-CoV-2 viral copies this assay can detect is 131 copies/mL. A negative result does not preclude SARS-Cov-2 infection and should not be used as the sole basis for treatment or other patient management decisions. A negative result may occur with  improper specimen collection/handling, submission of specimen other than nasopharyngeal swab, presence of viral mutation(s) within the areas targeted by this assay, and inadequate number of viral copies (<131 copies/mL). A negative  result must be combined with clinical observations, patient history, and epidemiological information. The expected result is Negative. Fact Sheet for Patients:  PinkCheek.be Fact Sheet for Healthcare Providers:  GravelBags.it This test is not yet ap proved or cleared by the [place] FDA and  has been authorized for detection and/or diagnosis of SARS-CoV-2 by FDA under an Emergency Use Authorization (EUA). This EUA will remain  in effect (meaning this test can be used) for the duration of the COVID-19 declaration under Section 564(b)(1) of the Act, 21 U.S.C. section 360bbb-3(b)(1), unless the authorization is terminated or revoked sooner.    Influenza A by PCR NEGATIVE NEGATIVE Final   Influenza B by PCR NEGATIVE NEGATIVE Final    Comment: (NOTE) The Xpert Xpress SARS-CoV-2/FLU/RSV assay is intended as an aid in  the diagnosis of influenza from Nasopharyngeal swab specimens and  should not be used as a sole basis for treatment. Nasal washings and  aspirates are unacceptable for Xpert Xpress SARS-CoV-2/FLU/RSV  testing. Fact Sheet for Patients: PinkCheek.be Fact Sheet for Healthcare Providers: GravelBags.it This test is not yet approved or cleared by the [place] FDA and  has been authorized for detection and/or diagnosis of SARS-CoV-2 by  FDA under an Emergency Use Authorization (EUA). This EUA will remain  in effect (meaning this test can be used) for the duration of the  Covid-19 declaration under Section 564(b)(1) of the Act, 21  U.S.C. section 360bbb-3(b)(1), unless the authorization is  terminated or revoked. Performed at [facility], [address] [place], [place] [address]      Radiological Exams on Admission: CT Angio Chest PE W and/or Wo Contrast  Result Date: 04/20/2019 CLINICAL DATA:  Shortness of breath. EXAM: CT ANGIOGRAPHY CHEST WITH  CONTRAST TECHNIQUE: Multidetector CT imaging of the chest was performed using the standard protocol during bolus administration of intravenous contrast. Multiplanar CT image reconstructions and MIPs were obtained to evaluate the vascular anatomy. CONTRAST:  144mL OMNIPAQUE IOHEXOL 350 MG/ML SOLN COMPARISON:  None. FINDINGS: Cardiovascular: Satisfactory opacification of the pulmonary arteries to the segmental level. No evidence of pulmonary embolism. Normal heart size. No pericardial effusion. Atherosclerosis of thoracic aorta is noted without aneurysm or dissection. Mediastinum/Nodes: Thyroid gland and esophagus are unremarkable. Enlarged pre-vascular a subcarinal  adenopathy is noted which most likely is inflammatory in etiology. Lungs/Pleura: No pneumothorax is noted. Minimal bilateral pleural effusions are noted. Multiple airspace opacities are noted throughout both lungs most consistent with multifocal pneumonia, most likely of viral etiology. Upper Abdomen: No acute abnormality. Musculoskeletal: No chest wall abnormality. No acute or significant osseous findings. Review of the MIP images confirms the above findings. IMPRESSION: 1. No definite evidence of pulmonary embolus. 2. Multiple airspace opacities are noted throughout both lungs most consistent with multifocal pneumonia, most likely of viral etiology. 3. Minimal bilateral pleural effusions are noted. 4. Enlarged mediastinal adenopathy is noted which most likely is inflammatory in etiology. Aortic Atherosclerosis (ICD10-I70.0). Electronically Signed   By: Marijo Conception M.D.   On: 04/20/2019 10:54   US Venous Img Lower Unilateral Right  Result Date: 04/20/2019 CLINICAL DATA:  Swelling x5 days EXAM: RIGHT LOWER EXTREMITY VENOUS DOPPLER ULTRASOUND TECHNIQUE: Gray-scale sonography with compression, as well as color and duplex ultrasound, were performed to evaluate the deep venous system(s) from the level of the common femoral vein through the popliteal  and proximal calf veins. COMPARISON:  None. FINDINGS: VENOUS Normal compressibility of the common femoral, superficial femoral, and popliteal veins, as well as the visualized calf veins. Visualized portions of profunda femoral vein and great saphenous vein unremarkable. No filling defects to suggest DVT on grayscale or color Doppler imaging. Doppler waveforms show normal direction of venous flow, normal respiratory phasicity and response to augmentation. Limited views of the contralateral common femoral vein are unremarkable. OTHER Subcutaneous calf edema. Limitations: none IMPRESSION: No femoropopliteal DVT nor evidence of DVT within the visualized calf veins. If clinical symptoms are inconsistent or if there are persistent or worsening symptoms, further imaging (possibly involving the iliac veins) may be warranted. Electronically Signed   By: Lucrezia Europe M.D.   On: 04/20/2019 10:33   DG Chest Portable 1 View  Result Date: 04/20/2019 CLINICAL DATA:  Shortness of breath, history coronary artery disease, hypertension, type II diabetes mellitus, COPD, smoker EXAM: PORTABLE CHEST 1 VIEW COMPARISON:  Portable exam 0853 hours compared to 01/28/2019 FINDINGS: Lordotic positioning. Enlargement of cardiac silhouette. Mediastinal contours normal. Patchy BILATERAL pulmonary infiltrates consistent with multifocal pneumonia. No pleural effusion or pneumothorax. No acute osseous findings. IMPRESSION: Patchy BILATERAL pulmonary infiltrates consistent with multifocal pneumonia; question of COVID-19 pneumonia is raised. Electronically Signed   By: Lavonia Dana M.D.   On: 04/20/2019 09:00   ECHOCARDIOGRAM COMPLETE  Result Date: 04/20/2019    ECHOCARDIOGRAM REPORT   Patient Name:   Peter Lambert Date of Exam: 04/20/2019 Medical Rec #:  [id number]       Height:       67.0 in Accession #:    [id number]      Weight:       198.4 lb Date of Birth:  28-Sep-1945      BSA:          2.015 m Patient Age:    7 years        BP:            119/65 mmHg Patient Gender: M               HR:           104 bpm. Exam Location:  [facility] Procedure: 2D Echo Indications:    Dyspnea 786.09 / R06.00  History:        Patient has prior history of Echocardiogram examinations, most  recent 07/03/2016. CAD, COPD; Risk Factors:Diabetes, Dyslipidemia                 and Hypertension. Acute respiratory failure with hypoxia ,                 Peripheral arterial disease.  Sonographer:    Leavy Cella RDCS (AE) Referring Phys: [place]  1. Left ventricular ejection fraction, by estimation, is 50 to 55%. The left ventricle has low normal function. The left ventricle has no regional wall motion abnormalities. Indeterminate diastolic filling due to E-A fusion. Elevated left ventricular end-diastolic pressure.  2. Right ventricular systolic function is mildly reduced. The right ventricular size is normal. There is moderately elevated pulmonary artery systolic pressure.  3. Left atrial size was moderately dilated.  4. The mitral valve is grossly normal. Mild mitral valve regurgitation.  5. The aortic valve is tricuspid. Aortic valve regurgitation is not visualized. No aortic stenosis is present.  6. The inferior vena cava is dilated in size with >50% respiratory variability, suggesting right atrial pressure of 8 mmHg. FINDINGS  Left Ventricle: Left ventricular ejection fraction, by estimation, is 50 to 55%. The left ventricle has low normal function. The left ventricle has no regional wall motion abnormalities. The left ventricular internal cavity size was normal in size. There is no left ventricular hypertrophy. Indeterminate diastolic filling due to E-A fusion. Elevated left ventricular end-diastolic pressure. Right Ventricle: The right ventricular size is normal. No increase in right ventricular wall thickness. Right ventricular systolic function is mildly reduced. There is moderately elevated pulmonary artery systolic pressure. The  tricuspid regurgitant velocity is 3.30 m/s, and with an assumed right atrial pressure of 10 mmHg, the estimated right ventricular systolic pressure is [id number] mmHg. Left Atrium: Left atrial size was moderately dilated. Right Atrium: Right atrial size was normal in size. Pericardium: There is no evidence of pericardial effusion. Mitral Valve: The mitral valve is grossly normal. Mild mitral valve regurgitation. Tricuspid Valve: The tricuspid valve is grossly normal. Tricuspid valve regurgitation is mild. Aortic Valve: The aortic valve is tricuspid. . There is mild thickening of the aortic valve. Aortic valve regurgitation is not visualized. No aortic stenosis is present. There is mild thickening of the aortic valve. Pulmonic Valve: The pulmonic valve was grossly normal. Pulmonic valve regurgitation is not visualized. Aorta: The aortic root is normal in size and structure. Venous: The inferior vena cava is dilated in size with greater than 50% respiratory variability, suggesting right atrial pressure of 8 mmHg. IAS/Shunts: No atrial level shunt detected by color flow Doppler.  LEFT VENTRICLE PLAX 2D LVIDd:         5.04 cm  Diastology LVIDs:         4.45 cm  LV e' lateral:   8.16 cm/s LV PW:         1.28 cm  LV E/e' lateral: 10.7 LV IVS:        0.86 cm  LV e' medial:    4.03 cm/s LVOT diam:     2.10 cm  LV E/e' medial:  21.7 LVOT Area:     3.46 cm  RIGHT VENTRICLE RV S prime:     6.31 cm/s TAPSE (M-mode): 1.1 cm LEFT ATRIUM             Index       RIGHT ATRIUM           Index LA diam:        5.20 cm  2.58 cm/m  RA Area:     22.20 cm LA Vol (A2C):   68.3 ml 33.89 ml/m RA Volume:   78.40 ml  38.90 ml/m LA Vol (A4C):   80.0 ml 39.69 ml/m LA Biplane Vol: 80.7 ml 40.04 ml/m   AORTA Ao Root diam: 3.20 cm MITRAL VALVE               TRICUSPID VALVE MV Area (PHT): 4.60 cm    TR Peak grad:   43.6 mmHg MV Decel Time: 165 msec    TR Vmax:        330.00 cm/s MR Peak grad: 60.2 mmHg MR Mean grad: 42.0 mmHg    SHUNTS MR Vmax:       388.00 cm/s  Systemic Diam: 2.10 cm MR Vmean:     307.0 cm/s MV E velocity: 87.30 cm/s MV A velocity: 39.80 cm/s MV E/A ratio:  2.19 Kate Sable MD Electronically signed by Kate Sable MD Signature Date/Time: 04/20/2019/3:05:50 PM    Final     EKG: Independently reviewed.  Nonspecific ST depression; normal axis.  Sinus rhythm.  Assessment/Plan 1-acute respiratory failure with hypoxia [facility]): Multifactorial including community-acquired pneumonia/bronchiectasis, COPD exacerbation and presumed heart failure exacerbation. -Elevated BNP, complaining of lower extremity swelling and orthopnea. -Chest x-ray with vascular congestion by lateral patchy opacities with concerns for infection. -Symptoms present for over a week -Elevated troponin in the thousand range. -No chest pain on physical exam; no complaining of shortness of breath and with positive bibasilar rales. -Multitherapeutic approach using nebulizer management, steroids, IV Lasix and, IV antibiotics. -Follow daily weights, strict I's and O's and low-sodium diet -Patient advised to stop smoking -Will cycle troponin, Check 2D echo and follow cardiology service recommendations (consulted by EDP). -follow cultures results    2-elevated troponin -No chest pain and no frank ischemic changes EKG -Will monitor on telemetry -Cycle troponin -follow 2 D echo results -Continue aspirin and Plavix. -As needed nitroglycerin has been ordered.  3-elevated lactic acid -2.7 level on admission  -Likely associated with shortness of breath and bronchodilator management -Patient is afebrile and normal WBC is appreciated. -Given concerns for vascular congestion and CHF component will hold on IVF's  4-GERD -continue PPI  5-tobacco abuse -Cessation counseling has been provided -NicoDerm patch has been ordered.  6-type 2 diabetes mellitus with hyperglycemia -Check A1c -Home oral hypoglycemic agents -Patient started on sliding scale  insulin. -follow CBG fluctuation   7-HLD -continue statins.   DVT prophylaxis: Heparin Code Status: Full code Family Communication: No family at bedside. Disposition Plan: Anticipate discharge home once medically stable. Consults called: Cardiology service. Admission status: Inpatient, stepdown, length of stay more than 2 midnights; is my clinical judgment that in the setting of acute respiratory failure with hypoxia most likely due to community-acquired pneumonia, COPD exacerbation and CHF exacerbation patient will require more than 2 midnights to clinically stabilize his condition for outpatient management.  He is currently receiving nebulizer management, IV antibiotics, IV diuresis, will check 2D echo given risk factors continue trending his troponin and discuss case with cardiology service.   Time Spent: 70 minutes.  Peter Dubois MD [facility] Pager [phone]  04/20/2019, 5:26 PM

## 2019-04-20 NOTE — ED Provider Notes (Signed)
[facility] EMERGENCY DEPARTMENT Provider Note   CSN: [id number] Arrival date & time: 04/20/19  0820     History Chief Complaint  Patient presents with  . Shortness of Breath    [place] is a 74 y.o. male.  Level 5 caveat for respiratory distress.  Patient sent by PCP for difficulty breathing worsening over the past 1 week.  States he was seen by his PCP 5 days ago and given "2 shots" for his breathing.  He had a recheck today and was sent to the ED by Dr. Hilma Favors.  He does have a history of COPD, diabetes, hyperlipidemia hypertension and previous MI.  He continues to smoke.  States he does not use any breathing treatments on a regular basis.  Has a cough productive of clear and yellow mucus.  Denies fever, chills, nausea or vomiting.  Has also noticed increased swelling to his right leg only for the past several days.  No history of blood clots.  Denies any chest pain or abdominal pain.  No vomiting or diarrhea.  He states he only uses his albuterol as needed for his breathing.  Denies any known coronavirus exposures.  The history is provided by the patient.  Shortness of Breath Associated symptoms: cough   Associated symptoms: no abdominal pain, no chest pain, no fever, no headaches and no vomiting        Past Medical History:  Diagnosis Date  . Coronary artery disease   . Hyperlipidemia   . Hypertension   . Hypertension   . MI, old   . Peripheral arterial disease ([place])   . Tobacco abuse   . Type 2 diabetes mellitus [facility])     Patient Active Problem List   Diagnosis Date Noted  . Acute respiratory failure with hypoxemia ([place]) 07/02/2016  . COPD with acute exacerbation ([place]) 07/02/2016  . Tobacco use 07/17/2013  . Peripheral arterial disease ([place]) 12/03/2012  . Coronary artery disease 12/03/2012  . Essential hypertension 12/03/2012  . Hyperlipidemia 12/03/2012  . Type 2 diabetes mellitus ([place]) 12/03/2012    Past Surgical History:  Procedure Laterality Date    . CARDIAC SURGERY    . CORONARY STENT PLACEMENT         Family History  Problem Relation Age of Onset  . Cancer Mother     Social History   Tobacco Use  . Smoking status: Current Every Day Smoker    Packs/day: 0.50    Years: 52.00    Pack years: 26.00    Types: Cigarettes  . Smokeless tobacco: Never Used  Substance Use Topics  . Alcohol use: No  . Drug use: No    Home Medications Prior to Admission medications   Medication Sig Start Date End Date Taking? Authorizing Provider  albuterol (PROVENTIL HFA;VENTOLIN HFA) 108 (90 Base) MCG/ACT inhaler Inhale 2 puffs into the lungs every 4 (four) hours as needed for wheezing or shortness of breath. 04/10/16   Horton, Barbette Hair, MD  amLODipine (NORVASC) 10 MG tablet Take 1 tablet (10 mg total) by mouth daily. 05/29/17   Lorretta Harp, MD  aspirin 81 MG chewable tablet Chew 81 mg by mouth daily.      [provider]  clopidogrel (PLAVIX) 75 MG tablet Take 75 mg by mouth daily. 09/21/18   [provider]  colchicine 0.6 MG tablet TAKE 1 TAB NOW, 1 TAB IN 90 MINUTES, THEN 1 TAB TWICE A DAY UNTIL SYMPTOMS RESOLVE. 05/23/18   [provider]  ferrous sulfate 325 (65 FE) MG tablet Take 325 mg by mouth 3 (three) times daily. 05/13/17   [provider]  HYDROcodone-acetaminophen (NORCO) 5-325 MG tablet Take 1-2 tablets by mouth every 6 (six) hours as needed. 02/27/18   Veryl Speak, MD  indomethacin (INDOCIN) 25 MG capsule Take 1 capsule (25 mg total) by mouth 3 (three) times daily as needed. 02/27/18   Veryl Speak, MD  KLOR-CON M20 20 MEQ tablet TAKE 1 TABLET BY MOUTH EVERY DAY 10/03/18   Lorretta Harp, MD  nitroGLYCERIN (NITROSTAT) 0.4 MG SL tablet Place 1 tablet (0.4 mg total) under the tongue every 5 (five) minutes as needed for chest pain. Take 03/13/19   Lorretta Harp, MD  potassium chloride (KLOR-CON) 20 MEQ packet TAKE 1 TABLET BY MOUTH EVERY DAY 12/24/12 07/14/13  Lorretta Harp, MD     Allergies    Patient has no known allergies.  Review of Systems   Review of Systems  Constitutional: Positive for activity change, appetite change and fatigue. Negative for fever.  HENT: Positive for congestion and rhinorrhea.   Eyes: Negative for visual disturbance.  Respiratory: Positive for cough and shortness of breath.   Cardiovascular: Positive for leg swelling. Negative for chest pain.  Gastrointestinal: Negative for abdominal pain, nausea and vomiting.  Genitourinary: Negative for dysuria and hematuria.  Musculoskeletal: Negative for arthralgias and myalgias.  Neurological: Negative for weakness and headaches.   all other systems are negative except as noted in the HPI and PMH.    Physical Exam Updated Vital Signs BP 129/76   Pulse (!) 103   Temp 98.1 F (36.7 C) (Oral)   Resp (!) 31   Ht 5\' 7"  (1.702 m)   Wt 90 kg   SpO2 (!) 88%   BMI 31.08 kg/m   Physical Exam Vitals and nursing note reviewed.  Constitutional:      General: He is in acute distress.     Appearance: He is well-developed.     Comments: Mild increased work of breathing, speaking short sentences  HENT:     Head: Normocephalic and atraumatic.     Mouth/Throat:     Pharynx: No oropharyngeal exudate.  Eyes:     Conjunctiva/sclera: Conjunctivae normal.     Pupils: Pupils are equal, round, and reactive to light.  Neck:     Comments: No meningismus. Cardiovascular:     Rate and Rhythm: Regular rhythm. Tachycardia present.     Heart sounds: Normal heart sounds. No murmur.  Pulmonary:     Effort: Respiratory distress present.     Breath sounds: Wheezing present.     Comments: Inspiratory expiratory wheezing bilaterally with rhonchi Tachypneic with conversation Abdominal:     Palpations: Abdomen is soft.     Tenderness: There is no abdominal tenderness. There is no guarding or rebound.  Musculoskeletal:        General: Swelling present. No tenderness. Normal range of motion.     Cervical  back: Normal range of motion and neck supple.     Right lower leg: Edema present.     Comments: Asymmetric pitting edema to right leg.  Intact DP and PT pulses. Hyperkeratotic changes of skin bilaterally  Skin:    General: Skin is warm.  Neurological:     Mental Status: He is alert and oriented to person, place, and time.     Cranial Nerves: No cranial nerve deficit.     Motor: No abnormal muscle tone.     Coordination:  Coordination normal.     Comments: No ataxia on finger to nose bilaterally. No pronator drift. 5/5 strength throughout. CN 2-12 intact.Equal grip strength. Sensation intact.   Psychiatric:        Behavior: Behavior normal.     ED Results / Procedures / Treatments   Labs (all labs ordered are listed, but only abnormal results are displayed) Labs Reviewed  CBC WITH DIFFERENTIAL/PLATELET - Abnormal; Notable for the following components:      Result Value   RBC 3.52 (*)    Hemoglobin 9.6 (*)    HCT 31.6 (*)    RDW 15.8 (*)    Abs Immature Granulocytes 0.09 (*)    All other components within normal limits  COMPREHENSIVE METABOLIC PANEL - Abnormal; Notable for the following components:   CO2 21 (*)    Glucose, Bld 153 (*)    Calcium 8.3 (*)    Albumin 2.8 (*)    GFR calc non Af Amer 58 (*)    All other components within normal limits  D-DIMER, QUANTITATIVE (NOT AT [facility]) - Abnormal; Notable for the following components:   D-Dimer, Quant 3.81 (*)    All other components within normal limits  BRAIN NATRIURETIC PEPTIDE - Abnormal; Notable for the following components:   B Natriuretic Peptide 580.0 (*)    All other components within normal limits  PROTIME-INR - Abnormal; Notable for the following components:   Prothrombin Time 15.6 (*)    INR 1.3 (*)    All other components within normal limits  BLOOD GAS, ARTERIAL - Abnormal; Notable for the following components:   pH, Arterial 7.462 (*)    pCO2 arterial 29.6 (*)    pO2, Arterial 59.1 (*)    Acid-base deficit  2.3 (*)    All other components within normal limits  TROPONIN I (HIGH SENSITIVITY) - Abnormal; Notable for the following components:   Troponin I (High Sensitivity) 1,053 (*)    All other components within normal limits  RESPIRATORY PANEL BY RT PCR (FLU A&B, COVID)  CULTURE, BLOOD (ROUTINE X 2)  CULTURE, BLOOD (ROUTINE X 2)  LACTIC ACID, PLASMA  LACTIC ACID, PLASMA  POC SARS CORONAVIRUS 2 AG -  ED  TROPONIN I (HIGH SENSITIVITY)    EKG EKG Interpretation  Date/Time:  Monday April 20 2019 09:38:42 EST Ventricular Rate:  110 PR Interval:    QRS Duration: 101 QT Interval:  338 QTC Calculation: 458 R Axis:   83 Text Interpretation: Sinus tachycardia Paired ventricular premature complexes Borderline right axis deviation Repol abnrm, severe global ischemia (LM/MVD) inferior and lateral ST depression Confirmed by Ezequiel Essex [phone]) on 04/20/2019 9:53:30 AM   Radiology CT Angio Chest PE W and/or Wo Contrast  Result Date: 04/20/2019 CLINICAL DATA:  Shortness of breath. EXAM: CT ANGIOGRAPHY CHEST WITH CONTRAST TECHNIQUE: Multidetector CT imaging of the chest was performed using the standard protocol during bolus administration of intravenous contrast. Multiplanar CT image reconstructions and MIPs were obtained to evaluate the vascular anatomy. CONTRAST:  118mL OMNIPAQUE IOHEXOL 350 MG/ML SOLN COMPARISON:  None. FINDINGS: Cardiovascular: Satisfactory opacification of the pulmonary arteries to the segmental level. No evidence of pulmonary embolism. Normal heart size. No pericardial effusion. Atherosclerosis of thoracic aorta is noted without aneurysm or dissection. Mediastinum/Nodes: Thyroid gland and esophagus are unremarkable. Enlarged pre-vascular a subcarinal adenopathy is noted which most likely is inflammatory in etiology. Lungs/Pleura: No pneumothorax is noted. Minimal bilateral pleural effusions are noted. Multiple airspace opacities are noted throughout both lungs most consistent  with multifocal pneumonia, most likely of viral etiology. Upper Abdomen: No acute abnormality. Musculoskeletal: No chest wall abnormality. No acute or significant osseous findings. Review of the MIP images confirms the above findings. IMPRESSION: 1. No definite evidence of pulmonary embolus. 2. Multiple airspace opacities are noted throughout both lungs most consistent with multifocal pneumonia, most likely of viral etiology. 3. Minimal bilateral pleural effusions are noted. 4. Enlarged mediastinal adenopathy is noted which most likely is inflammatory in etiology. Aortic Atherosclerosis (ICD10-I70.0). Electronically Signed   By: Marijo Conception M.D.   On: 04/20/2019 10:54   US Venous Img Lower Unilateral Right  Result Date: 04/20/2019 CLINICAL DATA:  Swelling x5 days EXAM: RIGHT LOWER EXTREMITY VENOUS DOPPLER ULTRASOUND TECHNIQUE: Gray-scale sonography with compression, as well as color and duplex ultrasound, were performed to evaluate the deep venous system(s) from the level of the common femoral vein through the popliteal and proximal calf veins. COMPARISON:  None. FINDINGS: VENOUS Normal compressibility of the common femoral, superficial femoral, and popliteal veins, as well as the visualized calf veins. Visualized portions of profunda femoral vein and great saphenous vein unremarkable. No filling defects to suggest DVT on grayscale or color Doppler imaging. Doppler waveforms show normal direction of venous flow, normal respiratory phasicity and response to augmentation. Limited views of the contralateral common femoral vein are unremarkable. OTHER Subcutaneous calf edema. Limitations: none IMPRESSION: No femoropopliteal DVT nor evidence of DVT within the visualized calf veins. If clinical symptoms are inconsistent or if there are persistent or worsening symptoms, further imaging (possibly involving the iliac veins) may be warranted. Electronically Signed   By: Lucrezia Europe M.D.   On: 04/20/2019 10:33   DG  Chest Portable 1 View  Result Date: 04/20/2019 CLINICAL DATA:  Shortness of breath, history coronary artery disease, hypertension, type II diabetes mellitus, COPD, smoker EXAM: PORTABLE CHEST 1 VIEW COMPARISON:  Portable exam 0853 hours compared to 01/28/2019 FINDINGS: Lordotic positioning. Enlargement of cardiac silhouette. Mediastinal contours normal. Patchy BILATERAL pulmonary infiltrates consistent with multifocal pneumonia. No pleural effusion or pneumothorax. No acute osseous findings. IMPRESSION: Patchy BILATERAL pulmonary infiltrates consistent with multifocal pneumonia; question of COVID-19 pneumonia is raised. Electronically Signed   By: Lavonia Dana M.D.   On: 04/20/2019 09:00    Procedures .Critical Care Performed by: Ezequiel Essex, MD Authorized by: Ezequiel Essex, MD   Critical care provider statement:    Critical care time (minutes):  45   Critical care was necessary to treat or prevent imminent or life-threatening deterioration of the following conditions:  Respiratory failure and cardiac failure   Critical care was time spent personally by me on the following activities:  Discussions with consultants, evaluation of patient's response to treatment, examination of patient, ordering and performing treatments and interventions, ordering and review of laboratory studies, ordering and review of radiographic studies, pulse oximetry, re-evaluation of patient's condition, obtaining history from patient or surrogate and review of old charts   (including critical care time)  Medications Ordered in ED Medications  albuterol (VENTOLIN HFA) 108 (90 Base) MCG/ACT inhaler 4 puff (has no administration in time range)  methylPREDNISolone sodium succinate (SOLU-MEDROL) 125 mg/2 mL injection 80 mg (has no administration in time range)    ED Course  I have reviewed the triage vital signs and the nursing notes.  Pertinent labs & imaging results that were available during my care of the  patient were reviewed by me and considered in my medical decision making (see chart for details).    MDM Rules/Calculators/A&P  Patient sent by PCP with 1 week of difficulty breathing that is progressively worsening.  States he has had a cough but no fever or chest pain.  Also has noticed right leg swelling.  He is wheezing on exam.  Given bronchodilators and nebulizers and steroids.  His EKG shows ST depressions inferiorly laterally similar to previous but more pronounced  X-ray concerning for multifocal infiltrates with suspicion for Covid though Covid swab is negative.  Patient given bronchodilators and Decadron.  History of new hypoxia and tachypnea.  Right leg ultrasound is negative for DVT.  EKG changes and positive troponin discussed with Dr. Bronson Ing cardiology who feels this is likely secondary to his respiratory process and not an acute MI.  Does agree with CT PE scan.  Also consider giving Lasix for suspected new onset CHF.  Covid swab has returned negative.  We will add on antibiotics for possible bronchopneumonia.  Patient given broad-spectrum antibiotics after cultures are obtained.  There is concern for possible new heart failure as well given patient's x-ray appearance and troponin elevation.  He was given a dose of Lasix.  Did have a normal echocardiogram in 2018.  CT PE scan is negative for pulmonary embolism but does show multifocal airspace disease.  Patient remains tachypneic but stable on nasal cannula requiring oxygen supplementation.  Admission discussed with Dr. Dyann Kief  ED ECG REPORT   Date: 04/20/2019  Rate: 112  Rhythm: sinus tachycardia  QRS Axis: normal  Intervals: normal  ST/T Wave abnormalities: nonspecific ST changes, ST depressions inferiorly and ST depressions laterally  Conduction Disutrbances:none  Narrative Interpretation:   Old EKG Reviewed: changes noted  I have personally reviewed the EKG tracing and agree with the  computerized printout as noted.   Jesson Senske Maeder was evaluated in Emergency Department on 04/20/2019 for the symptoms described in the history of present illness. He was evaluated in the context of the global COVID-19 pandemic, which necessitated consideration that the patient might be at risk for infection with the SARS-CoV-2 virus that causes COVID-19. Institutional protocols and algorithms that pertain to the evaluation of patients at risk for COVID-19 are in a state of rapid change based on information released by regulatory bodies including the CDC and federal and state organizations. These policies and algorithms were followed during the patient's care in the ED.   Final Clinical Impression(s) / ED Diagnoses Final diagnoses:  Acute respiratory failure with hypoxia [facility])  Multifocal pneumonia    Rx / DC Orders ED Discharge Orders    None       Jodine Muchmore, Annie Main, MD 04/20/19 [phone]

## 2019-04-20 NOTE — ED Notes (Signed)
Date and time results received: 04/20/19 9:28 AM  (use smartphrase ".now" to insert current time)  Test: trop Critical Value: 1053  Name of Provider Notified: Rancour  Orders Received? Or Actions Taken?: Orders Received - See Orders for details

## 2019-04-20 NOTE — Progress Notes (Signed)
*  PRELIMINARY RESULTS* Echocardiogram 2D Echocardiogram has been performed.  Leavy Cella 04/20/2019, 3:03 PM

## 2019-04-20 NOTE — Progress Notes (Signed)
CRITICAL VALUE ALERT  Critical Value:  Troponin 1122  Date & Time Notied:  04/20/19 @ 1940  Provider Notified: Tylene Fantasia  Orders Received/Actions taken: Waiting for call back/orders. Patients last troponin was 1135

## 2019-04-20 NOTE — Progress Notes (Signed)
RN paged elevated troponin and that pt's HR was in the 140s, not sustaining, and the rhythm looks like Aflutter. NP noticed day attending had placed some orders which included starting a Heparin drip. NP called Dr. Dyann Kief and discussed case given that this NP would be receiving calls on pt until 1am 2/23. Case discussed. Troponin elevation likely demand ischemia. Nebs changed to Xopenex due to tachycardia. He reviewed EKG and ? whether this is truly Aflutter. Are covering patient tonight with anticoagulation and have given Metoprolol. Pt is not having any active chest pain. R/p EKG order placed for comparison.  KJKG, NP [facility]

## 2019-04-20 NOTE — ED Notes (Signed)
Date and time results received: 04/20/2019 1357  (use smartphrase ".now" to insert current time)  Test: trop Critical Value: 1135  Name of Provider Notified: Rsncour  Orders Received? Or Actions Taken?: see chart for further orders

## 2019-04-20 NOTE — ED Triage Notes (Signed)
Patient complains of shortness of breath x 1 week. States was sent to ED by Dr. Hilma Favors. States history of COPD.

## 2019-04-20 NOTE — Progress Notes (Signed)
ANTICOAGULATION CONSULT NOTE - Initial Consult  Pharmacy Consult for Heparin Indication: atrial fibrillation  No Known Allergies  Patient Measurements: Height: 5\' 8"  (172.7 cm) Weight: 183 lb 10.3 oz (83.3 kg) IBW/kg (Calculated) : 68.4 Heparin Dosing Weight: actual body weight  Vital Signs: Temp: 98.1 F (36.7 C) (02/22 1634) Temp Source: Oral (02/22 1634) BP: 117/60 (02/22 1908) Pulse Rate: 102 (02/22 1908)  Labs: Recent Labs    04/20/19 0846 04/20/19 1206 04/20/19 1238 04/20/19 1840  HGB 9.6* 9.3*  --   --   HCT 31.6* 29.7*  --   --   PLT 265 251  --   --   LABPROT 15.6*  --   --   --   INR 1.3*  --   --   --   CREATININE 1.21 1.22  --   --   TROPONINIHS 1,053*  --  1,135* 1,022*    Estimated Creatinine Clearance: 55.1 mL/min (by C-G formula based on SCr of 1.22 mg/dL).   Medical History: Past Medical History:  Diagnosis Date  . Coronary artery disease   . Hyperlipidemia   . Hypertension   . Hypertension   . MI, old   . Peripheral arterial disease ([place])   . Tobacco abuse   . Type 2 diabetes mellitus ([facility])     Medications:  PTA:  ASA, Plavix  Assessment:  74 yr male presents with SOB, wheezing and lower extremity swelling  PMH significant for HTN, HLD, CAD s/p MI, DM, GERD, + tobacco use   Pharmacy consulted to dose IV heparin for AFib  Goal of Therapy:  Heparin level 0.3-0.7 units/ml Monitor platelets by anticoagulation protocol: Yes   Plan:   D/c previous order for heparin 5000 units sq q8h for VTE prophylaxis (no doses have yet been given)  Heparin 4000 units IV bolus x 1 followed by heparin gtt @ 1200 units/hr  Check heparin level 8 hr after heparin gtt started  Follow heparin level and CBC daily while on heparin gtt  Ayeza Therriault, Toribio Harbour, PharmD 04/20/2019,7:40 PM

## 2019-04-20 NOTE — Progress Notes (Signed)
CRITICAL VALUE ALERT  Critical Value:  Troponin 1116  Date & Time Notied:  04/20/19  Provider Notified: N/a  Orders Received/Actions taken: Troponin continues to drop

## 2019-04-20 NOTE — Progress Notes (Signed)
Pt's HR noted to have elevated to 140's. EKG obtained and Dr. Dyann Kief notified.

## 2019-04-21 ENCOUNTER — Encounter (HOSPITAL_COMMUNITY): Payer: Self-pay | Admitting: Internal Medicine

## 2019-04-21 DIAGNOSIS — J189 Pneumonia, unspecified organism: Secondary | ICD-10-CM

## 2019-04-21 DIAGNOSIS — E119 Type 2 diabetes mellitus without complications: Secondary | ICD-10-CM

## 2019-04-21 DIAGNOSIS — J690 Pneumonitis due to inhalation of food and vomit: Secondary | ICD-10-CM

## 2019-04-21 DIAGNOSIS — I4892 Unspecified atrial flutter: Secondary | ICD-10-CM

## 2019-04-21 DIAGNOSIS — I2489 Other forms of acute ischemic heart disease: Secondary | ICD-10-CM

## 2019-04-21 DIAGNOSIS — I5033 Acute on chronic diastolic (congestive) heart failure: Secondary | ICD-10-CM

## 2019-04-21 DIAGNOSIS — J441 Chronic obstructive pulmonary disease with (acute) exacerbation: Secondary | ICD-10-CM

## 2019-04-21 DIAGNOSIS — J9601 Acute respiratory failure with hypoxia: Secondary | ICD-10-CM

## 2019-04-21 DIAGNOSIS — I10 Essential (primary) hypertension: Secondary | ICD-10-CM

## 2019-04-21 DIAGNOSIS — I248 Other forms of acute ischemic heart disease: Secondary | ICD-10-CM

## 2019-04-21 LAB — CBC
HCT: 28.9 % — ABNORMAL LOW (ref 39.0–52.0)
Hemoglobin: 8.9 g/dL — ABNORMAL LOW (ref 13.0–17.0)
MCH: 27.6 pg (ref 26.0–34.0)
MCHC: 30.8 g/dL (ref 30.0–36.0)
MCV: 89.8 fL (ref 80.0–100.0)
Platelets: 266 10*3/uL (ref 150–400)
RBC: 3.22 MIL/uL — ABNORMAL LOW (ref 4.22–5.81)
RDW: 15.9 % — ABNORMAL HIGH (ref 11.5–15.5)
WBC: 11 10*3/uL — ABNORMAL HIGH (ref 4.0–10.5)
nRBC: 0 % (ref 0.0–0.2)

## 2019-04-21 LAB — LEGIONELLA PNEUMOPHILA SEROGP 1 UR AG: L. pneumophila Serogp 1 Ur Ag: NEGATIVE

## 2019-04-21 LAB — HEPARIN LEVEL (UNFRACTIONATED)
Heparin Unfractionated: 0.3 IU/mL (ref 0.30–0.70)
Heparin Unfractionated: 0.27 IU/mL — ABNORMAL LOW (ref 0.30–0.70)

## 2019-04-21 LAB — BASIC METABOLIC PANEL
Anion gap: 11 (ref 5–15)
BUN: 25 mg/dL — ABNORMAL HIGH (ref 8–23)
CO2: 20 mmol/L — ABNORMAL LOW (ref 22–32)
Calcium: 8.5 mg/dL — ABNORMAL LOW (ref 8.9–10.3)
Chloride: 107 mmol/L (ref 98–111)
Creatinine, Ser: 1.48 mg/dL — ABNORMAL HIGH (ref 0.61–1.24)
GFR calc Af Amer: 53 mL/min — ABNORMAL LOW (ref >60)
GFR calc non Af Amer: 46 mL/min — ABNORMAL LOW (ref >60)
Glucose, Bld: 231 mg/dL — ABNORMAL HIGH (ref 70–99)
Potassium: 4.6 mmol/L (ref 3.5–5.1)
Sodium: 138 mmol/L (ref 135–145)

## 2019-04-21 LAB — GLUCOSE, CAPILLARY
Glucose-Capillary: 210 mg/dL — ABNORMAL HIGH (ref 70–99)
Glucose-Capillary: 213 mg/dL — ABNORMAL HIGH (ref 70–99)
Glucose-Capillary: 183 mg/dL — ABNORMAL HIGH (ref 70–99)
Glucose-Capillary: 188 mg/dL — ABNORMAL HIGH (ref 70–99)

## 2019-04-21 MED ORDER — ALPRAZOLAM 0.25 MG PO TABS
0.2500 mg | ORAL_TABLET | Freq: Every evening | ORAL | Status: DC | PRN
  Administered 2019-04-21: 0.25 mg via ORAL
  Filled 2019-04-21: qty 1

## 2019-04-21 MED ORDER — ORAL CARE MOUTH RINSE
15.0000 mL | Freq: Two times a day (BID) | OROMUCOSAL | Status: DC
  Administered 2019-04-21 – 2019-04-23 (×5): 15 mL via OROMUCOSAL

## 2019-04-21 MED ORDER — DOXYCYCLINE HYCLATE 100 MG PO TABS
100.0000 mg | ORAL_TABLET | Freq: Two times a day (BID) | ORAL | Status: DC
  Administered 2019-04-21 (×2): 100 mg via ORAL
  Filled 2019-04-21 (×2): qty 1

## 2019-04-21 MED ORDER — HEPARIN SODIUM (PORCINE) 5000 UNIT/ML IJ SOLN
5000.0000 [IU] | Freq: Three times a day (TID) | INTRAMUSCULAR | Status: DC
  Administered 2019-04-21 – 2019-04-23 (×5): 5000 [IU] via SUBCUTANEOUS
  Filled 2019-04-21 (×5): qty 1

## 2019-04-21 MED ORDER — FUROSEMIDE 10 MG/ML IJ SOLN
20.0000 mg | Freq: Two times a day (BID) | INTRAMUSCULAR | Status: DC
  Administered 2019-04-22 – 2019-04-23 (×3): 20 mg via INTRAVENOUS
  Filled 2019-04-21 (×3): qty 2

## 2019-04-21 MED ORDER — COLLAGENASE 250 UNIT/GM EX OINT
TOPICAL_OINTMENT | Freq: Every day | CUTANEOUS | Status: DC
  Administered 2019-04-21: 1 via TOPICAL
  Filled 2019-04-21: qty 30

## 2019-04-21 MED ORDER — MAGNESIUM SULFATE 2 GM/50ML IV SOLN
2.0000 g | Freq: Once | INTRAVENOUS | Status: AC
  Administered 2019-04-21: 2 g via INTRAVENOUS
  Filled 2019-04-21: qty 50

## 2019-04-21 NOTE — Consult Note (Signed)
WOC Nurse Consult Note: Reason for Consult: DM ulceration on LLE Wound type: Neuropathic full thickness  Pressure Injury POA: NA Measurement: see nursing flow sheet  Wound bed:95% yellow/5% pink along wound edge  Drainage (amount, consistency, odor) scant on dressing.  Periwound:  Intact, no S/S of infection  Dressing procedure/placement/frequency: Enzymatic debridement ointment to clear yellow fibrinous material, cover with saline moist dressing.   Re consult if needed, will not follow at this time. Thanks  Kyrielle Urbanski [employer], RN,CWOCN, CNS, [place] [phone])

## 2019-04-21 NOTE — Progress Notes (Signed)
Pt requesting something for sleep. Dr. Olevia Bowens paged and made aware. Waiting for orders/call back. Will continue to monitor pt

## 2019-04-21 NOTE — Progress Notes (Signed)
ANTICOAGULATION CONSULT NOTE - Initial Consult  Pharmacy Consult for heparin IV Indication: atrial fibrillation/elevated troponin  No Known Allergies  Patient Measurements: Height: 5\' 8"  (172.7 cm) Weight: 183 lb 10.3 oz (83.3 kg) IBW/kg (Calculated) : 68.4 Heparin Dosing Weight: 83 kg  Vital Signs: Temp: 97.9 F (36.6 C) (02/23 0751) Temp Source: Oral (02/23 0751) BP: 120/68 (02/23 1100) Pulse Rate: 93 (02/23 1100)  Labs: Recent Labs    04/20/19 0846 04/20/19 0846 04/20/19 1206 04/20/19 1238 04/20/19 1840 04/20/19 2120 04/21/19 0401 04/21/19 1042  HGB 9.6*   < > 9.3*  --   --   --  8.9*  --   HCT 31.6*  --  29.7*  --   --   --  28.9*  --   PLT 265  --  251  --   --   --  266  --   LABPROT 15.6*  --   --   --   --   --   --   --   INR 1.3*  --   --   --   --   --   --   --   HEPARINUNFRC  --   --   --   --   --   --  0.30 0.27*  CREATININE 1.21  --  1.22  --   --   --  1.48*  --   TROPONINIHS 1,053*   < >  --  1,135* 1,022* 1,116*  --   --    < > = values in this interval not displayed.    Estimated Creatinine Clearance: 45.4 mL/min (A) (by C-G formula based on SCr of 1.48 mg/dL (H)).   Medical History: Past Medical History:  Diagnosis Date  . Coronary artery disease    a. s/p NSTEMI in 2010 with DES to proximal LAD with D1 jailed and angioplasty alone to ostium  . Hyperlipidemia   . Hypertension   . Hypertension   . MI, old   . Peripheral arterial disease ([place])   . Tobacco abuse   . Type 2 diabetes mellitus ([facility])     Medications:  Medications Prior to Admission  Medication Sig Dispense Refill Last Dose  . albuterol (PROVENTIL HFA;VENTOLIN HFA) 108 (90 Base) MCG/ACT inhaler Inhale 2 puffs into the lungs every 4 (four) hours as needed for wheezing or shortness of breath. 1 Inhaler 0 04/19/2019  . amLODipine (NORVASC) 10 MG tablet Take 1 tablet (10 mg total) by mouth daily. 30 tablet 11 04/20/2019 at 0600  . aspirin 81 MG chewable tablet Chew 81 mg by mouth  daily.     04/19/2019 at 1700  . clopidogrel (PLAVIX) 75 MG tablet Take 75 mg by mouth daily.   04/19/2019 at 1700  . colchicine 0.6 MG tablet Take 0.6 mg by mouth daily.    04/19/2019 at 1300  . diazepam (VALIUM) 2 MG tablet Take 2 mg by mouth every 12 (twelve) hours as needed for anxiety.   04/20/2019 at Unknown time  . ferrous sulfate 325 (65 FE) MG tablet Take 325 mg by mouth 3 (three) times daily.  11 04/20/2019 at 0600  . indomethacin (INDOCIN) 25 MG capsule Take 1 capsule (25 mg total) by mouth 3 (three) times daily as needed. 20 capsule 0   . KLOR-CON M20 20 MEQ tablet TAKE 1 TABLET BY MOUTH EVERY DAY (Patient taking differently: Take 20 mEq by mouth daily. ) 90 tablet 2 04/19/2019 at 1700  . nitroGLYCERIN (NITROSTAT)  0.4 MG SL tablet Place 1 tablet (0.4 mg total) under the tongue every 5 (five) minutes as needed for chest pain. Take 75 tablet 1   . atorvastatin (LIPITOR) 20 MG tablet Take 20 mg by mouth daily.   04/19/2019 at 1700    Assessment: Pharmacy consulted to dose heparin in patient with atrial fibrillation.  Patient is not on anticoagulation prior to admission.  Patient also has elevated troponin likely due to demand ischemia. He is to be maintained on heparin drip for 48 hours per cardiology.  Goal of Therapy:  Heparin level 0.3-0.7 units/ml Monitor platelets by anticoagulation protocol: Yes   Plan:  Increase heparin infusion to 1350 units/hr. Check anti-Xa level in 8 hours and daily while on heparin Continue to monitor H&H and platelets  Margot Ables, PharmD Clinical Pharmacist 04/21/2019 12:53 PM

## 2019-04-21 NOTE — Progress Notes (Signed)
PROGRESS NOTE    Peter Lambert  [id number] DOB: [birth year] DOA: 04/20/2019 PCP: Redmond School, MD     Brief Narrative:  74 y.o. male with a past medical history significant for tobacco abuse, hypertension, hyperlipidemia, history of coronary disease/myocardial infarction in the past, type 2 diabetes mellitus and gastroesophageal reflux disease; who presented to the hospital secondary to approximately 7-8 days of worsening shortness of breath.  Patient has been seen at the office of his PCP where he received 2 shots for what appears to be COPD exacerbation without significant improvement and continues worsening in his breathing status.  Patient reports some lower extremity edema, orthopnea and short of breath on exertion.  He expressed increased wheezing and no response to his home bronchodilator inhaler.  Patient denies any fever, chills, rhinorrhea, chest pain, abdominal pain, dysuria, hematuria, melena, hematochezia, headaches, focal weakness or any other complaints. In the ED he was found to be hypoxic in the high 80s on room air and very tachypneic with diffuse wheezing.  Covid test was negative.  Chest x-ray demonstrated bilateral patchy infiltrates and congestion; troponin significantly elevated in the 1000 range and also elevated BNP.  He was not febrile and with normal WBCs.  Renal function within normal limits.  Patient has a lactic acid of 2.7.  Cultures retrieved, cardiology service consulted, IV antibiotics started and one-time dose of Lasix given.  Patient also received albuterol nebulization and steroids. [facility] contacted to admit patient for further evaluation and management.    Maintaining good O2 sat on 4 L nasal cannula.  Assessment & Plan: 1-acute respiratory failure with hypoxia: in the setting of COPD exacerbation, PNA/bronchiectasis and diastolic CHF exacerbation. -continue steroids, IV diuresis, pulmicort, bronchodilators and flutter valve -will continue IV abx's and  follow clinical improvement -wean O2 down as tolerated -Follow daily weights, strict I's and O's and low-sodium diet. -Can expressed breathing easier today. -No having difficulty speaking in full sentences, with tachypnea, minimal exertion and requiring 4-5 L nasal cannula supplementation.  2-type 2 diabetes with hyperglycemia -A1c 7.6 -Holding oral hypoglycemic agents while inpatient -Continue the use of sliding scale insulin and follow CBG fluctuation.  3-nonsustained atrial flutter with RVR -Suppressed after 1 dose of metoprolol -Most likely as a consequence of pneumonia and bronchodilators regimen -At this moment he is in sinus rhythm. -Per cardiology recommendations no need for anticoagulation at this time and would not recommend the use of beta-blocker in the setting of COPD.  If required start low-dose calcium channel blocker and if that is needed no resumption of amlodipine was recommended. -Continue heparin drip. -Continue monitoring on telemetry -albuterol discontinued and patient started on xopenex.   5-prior history of coronary artery disease/NSTEMI -Currently with elevated troponin in the setting of demand ischemia -No acute ischemic changes appreciated on EKG or telemetry currently. -2D echo reassuring with no wall motion abnormalities and preserved ejection fraction. -Continue the use of aspirin, statins and Plavix. -Follow-up with cardiology service with intention to pursuit nuclear stress test. -Cardiology assistance and recommendations while inpatient; will discontinue heparin drip currently.  6-acute on chronic diastolic heart failure -As mentioned above continue daily weights, low-sodium diet, strict I's and O's. -Continue IV diuresis -Close monitoring of electrolytes and renal function while providing diuretics.  7-tobacco abuse -Cessation counseling has been provided-patient reports completely quitting smoking approximately 3-4 weeks ago -expressed  appreciative usage of nicotine patch while inpatient. -Encouraged to keep himself smoking free.  8-gastroesophageal flux disease -Continue PPI.  9-lactic acidosis  -peaked at 2.7 -  most likely from hypoxia and bronchodilators  -no IVF's due to vascular congestion and CHF exacerbation -will follow trend -advised to follow good oral hydration.   DVT prophylaxis: Heparin Code Status: Full code Family Communication: No family at bedside. Disposition Plan: Remains inpatient, continue close monitoring of respiratory  status in the stepdown for now.  Continue IV diuresis (adjusting Lasix dose in the setting of mild increase in his renal function); continue IV antibiotics, IV steroids and nebulizer management.  Consultants:   Service  Procedures:   Below for x-ray reports  2D echo: Normal ejection fraction, no wall motion normalities.  Antimicrobials:  Anti-infectives (From admission, onward)   Start     Dose/Rate Route Frequency Ordered Stop   04/21/19 1300  azithromycin (ZITHROMAX) 250 mg in dextrose 5 % 125 mL IVPB  Status:  Discontinued     250 mg 125 mL/hr over 60 Minutes Intravenous Every 24 hours 04/20/19 1728 04/21/19 1003   04/21/19 1200  doxycycline (VIBRA-TABS) tablet 100 mg     100 mg Oral Every 12 hours 04/21/19 1003     04/21/19 1000  cefTRIAXone (ROCEPHIN) 1 g in sodium chloride 0.9 % 100 mL IVPB     1 g 200 mL/hr over 30 Minutes Intravenous Every 24 hours 04/20/19 1728     04/20/19 1130  azithromycin (ZITHROMAX) 500 mg in sodium chloride 0.9 % 250 mL IVPB     500 mg 250 mL/hr over 60 Minutes Intravenous  Once 04/20/19 1056 04/20/19 1352   04/20/19 1100  cefTRIAXone (ROCEPHIN) 1 g in sodium chloride 0.9 % 100 mL IVPB     1 g 200 mL/hr over 30 Minutes Intravenous  Once 04/20/19 1056 04/20/19 1245       Subjective: Short of breath and tachypneic with minimal exertion; requiring 5 L nasal supplementation and having difficulty speaking in full sentences.  He  reports increased urine output, feeling better and denying chest pain.  Objective: Vitals:   04/21/19 1400 04/21/19 1430 04/21/19 1445 04/21/19 1614  BP:      Pulse: 94 93  93  Resp: (!) 27 (!) 34  (!) 31  Temp:    98.5 F (36.9 C)  TempSrc:    Oral  SpO2: 93% 90% 96% 90%  Weight:      Height:        Intake/Output Summary (Last 24 hours) at 04/21/2019 1822 Last data filed at 04/21/2019 1325 Gross per 24 hour  Intake 1104.43 ml  Output 800 ml  Net 304.43 ml   Filed Weights   04/20/19 0912 04/20/19 1549  Weight: 90 kg 83.3 kg    Examination: General exam: Alert, awake, oriented x 3; still he is feeling better and expressed increased urine output.  No chest pain, no nausea, no vomiting, no fever.  Still requiring oxygen supplementation (5 L) and demonstrating tachypnea with exertion and difficulty speaking in full sentences. Respiratory system: No frank crackles, decreased breath sounds at the bases; positive expiratory wheezing and no use of accessory muscles. Cardiovascular system:RRR. No rubs or gallops; no JVD on exam currently. Gastrointestinal system: Abdomen is nondistended, soft and nontender. No organomegaly or masses felt. Normal bowel sounds heard. Central nervous system: Alert and oriented. No focal neurological deficits. Extremities/skin: No cyanosis or clubbing; 1+ edema bilaterally (right more than left); left lower extremity with chronic ulceration that demonstrated no drainage or superimposed infection.  Intact periwound.  See epic media for pictures to his wound. Psychiatry: Judgement and insight appear normal. Mood &  affect appropriate.     Data Reviewed: I have personally reviewed following labs and imaging studies  CBC: Recent Labs  Lab 04/20/19 0846 04/20/19 1206 04/21/19 0401  WBC 8.7 10.0 11.0*  NEUTROABS 6.6  --   --   HGB 9.6* 9.3* 8.9*  HCT 31.6* 29.7* 28.9*  MCV 89.8 88.7 89.8  PLT 265 251 [id number]   Basic Metabolic Panel: Recent Labs  Lab  04/20/19 0846 04/20/19 1206 04/20/19 1455 04/21/19 0401  NA 138  --   --  138  K 3.7  --   --  4.6  CL 107  --   --  107  CO2 21*  --   --  20*  GLUCOSE 153*  --   --  231*  BUN 16  --   --  25*  CREATININE 1.21 1.22  --  1.48*  CALCIUM 8.3*  --   --  8.5*  MG  --   --  1.8  --    GFR: Estimated Creatinine Clearance: 45.4 mL/min (A) (by C-G formula based on SCr of 1.48 mg/dL (H)).   Liver Function Tests: Recent Labs  Lab 04/20/19 0846  AST 25  ALT 39  ALKPHOS 98  BILITOT 0.7  PROT 7.3  ALBUMIN 2.8*   Coagulation Profile: Recent Labs  Lab 04/20/19 0846  INR 1.3*   HbA1C: Recent Labs    04/20/19 1455  HGBA1C 7.6*   CBG: Recent Labs  Lab 04/20/19 1628 04/20/19 2112 04/21/19 0750 04/21/19 1117 04/21/19 1615  GLUCAP 292* 195* 210* 213* 183*   Lipid Profile: No results for input(s): CHOL, HDL, LDLCALC, TRIG, CHOLHDL, LDLDIRECT in the last 72 hours.   Thyroid Function Tests: Recent Labs    04/20/19 1200  TSH 0.905  0.926    Recent Results (from the past 240 hour(s))  Respiratory Panel by RT PCR (Flu A&B, Covid) - Nasopharyngeal Swab     Status: None   Collection Time: 04/20/19  9:28 AM   Specimen: Nasopharyngeal Swab  Result Value Ref Range Status   SARS Coronavirus 2 by RT PCR NEGATIVE NEGATIVE Final    Comment: (NOTE) SARS-CoV-2 target nucleic acids are NOT DETECTED. The SARS-CoV-2 RNA is generally detectable in upper respiratoy specimens during the acute phase of infection. The lowest concentration of SARS-CoV-2 viral copies this assay can detect is 131 copies/mL. A negative result does not preclude SARS-Cov-2 infection and should not be used as the sole basis for treatment or other patient management decisions. A negative result may occur with  improper specimen collection/handling, submission of specimen other than nasopharyngeal swab, presence of viral mutation(s) within the areas targeted by this assay, and inadequate number of viral  copies (<131 copies/mL). A negative result must be combined with clinical observations, patient history, and epidemiological information. The expected result is Negative. Fact Sheet for Patients:  PinkCheek.be Fact Sheet for Healthcare Providers:  GravelBags.it This test is not yet ap proved or cleared by the [place] FDA and  has been authorized for detection and/or diagnosis of SARS-CoV-2 by FDA under an Emergency Use Authorization (EUA). This EUA will remain  in effect (meaning this test can be used) for the duration of the COVID-19 declaration under Section 564(b)(1) of the Act, 21 U.S.C. section 360bbb-3(b)(1), unless the authorization is terminated or revoked sooner.    Influenza A by PCR NEGATIVE NEGATIVE Final   Influenza B by PCR NEGATIVE NEGATIVE Final    Comment: (NOTE) The Xpert Xpress SARS-CoV-2/FLU/RSV assay is intended  as an aid in  the diagnosis of influenza from Nasopharyngeal swab specimens and  should not be used as a sole basis for treatment. Nasal washings and  aspirates are unacceptable for Xpert Xpress SARS-CoV-2/FLU/RSV  testing. Fact Sheet for Patients: PinkCheek.be Fact Sheet for Healthcare Providers: GravelBags.it This test is not yet approved or cleared by the [place] FDA and  has been authorized for detection and/or diagnosis of SARS-CoV-2 by  FDA under an Emergency Use Authorization (EUA). This EUA will remain  in effect (meaning this test can be used) for the duration of the  Covid-19 declaration under Section 564(b)(1) of the Act, 21  U.S.C. section 360bbb-3(b)(1), unless the authorization is  terminated or revoked. Performed at [facility], [address], [place], [place] [address]   Blood culture (routine x 2)     Status: None (Preliminary result)   Collection Time: 04/20/19 10:57 AM   Specimen: BLOOD LEFT ARM  Result  Value Ref Range Status   Specimen Description BLOOD LEFT ARM  Final   Special Requests   Final    BOTTLES DRAWN AEROBIC AND ANAEROBIC Blood Culture results may not be optimal due to an inadequate volume of blood received in culture bottles   Culture   Final    NO GROWTH < 24 HOURS Performed at [facility], [address], [place], [place] [address]    Report Status PENDING  Incomplete  Blood culture (routine x 2)     Status: None (Preliminary result)   Collection Time: 04/20/19 11:02 AM   Specimen: BLOOD LEFT WRIST  Result Value Ref Range Status   Specimen Description BLOOD LEFT WRIST  Final   Special Requests   Final    BOTTLES DRAWN AEROBIC AND ANAEROBIC Blood Culture results may not be optimal due to an inadequate volume of blood received in culture bottles   Culture   Final    NO GROWTH < 24 HOURS Performed at [facility], [address] [place], [place] [address]    Report Status PENDING  Incomplete  MRSA PCR Screening     Status: None   Collection Time: 04/20/19  3:43 PM   Specimen: Nasal Mucosa; Nasopharyngeal  Result Value Ref Range Status   MRSA by PCR NEGATIVE NEGATIVE Final    Comment:        The GeneXpert MRSA Assay (FDA approved for NASAL specimens only), is one component of a comprehensive MRSA colonization surveillance program. It is not intended to diagnose MRSA infection nor to guide or monitor treatment for MRSA infections. Performed at [facility], [address] [place], [place] [address]      Radiology Studies: CT Angio Chest PE W and/or Wo Contrast  Result Date: 04/20/2019 CLINICAL DATA:  Shortness of breath. EXAM: CT ANGIOGRAPHY CHEST WITH CONTRAST TECHNIQUE: Multidetector CT imaging of the chest was performed using the standard protocol during bolus administration of intravenous contrast. Multiplanar CT image reconstructions and MIPs were obtained to evaluate the vascular anatomy. CONTRAST:  153mL OMNIPAQUE IOHEXOL 350 MG/ML SOLN COMPARISON:  None.  FINDINGS: Cardiovascular: Satisfactory opacification of the pulmonary arteries to the segmental level. No evidence of pulmonary embolism. Normal heart size. No pericardial effusion. Atherosclerosis of thoracic aorta is noted without aneurysm or dissection. Mediastinum/Nodes: Thyroid gland and esophagus are unremarkable. Enlarged pre-vascular a subcarinal adenopathy is noted which most likely is inflammatory in etiology. Lungs/Pleura: No pneumothorax is noted. Minimal bilateral pleural effusions are noted. Multiple airspace opacities are noted throughout both lungs most consistent with multifocal  pneumonia, most likely of viral etiology. Upper Abdomen: No acute abnormality. Musculoskeletal: No chest wall abnormality. No acute or significant osseous findings. Review of the MIP images confirms the above findings. IMPRESSION: 1. No definite evidence of pulmonary embolus. 2. Multiple airspace opacities are noted throughout both lungs most consistent with multifocal pneumonia, most likely of viral etiology. 3. Minimal bilateral pleural effusions are noted. 4. Enlarged mediastinal adenopathy is noted which most likely is inflammatory in etiology. Aortic Atherosclerosis (ICD10-I70.0). Electronically Signed   By: Marijo Conception M.D.   On: 04/20/2019 10:54   US Venous Img Lower Unilateral Right  Result Date: 04/20/2019 CLINICAL DATA:  Swelling x5 days EXAM: RIGHT LOWER EXTREMITY VENOUS DOPPLER ULTRASOUND TECHNIQUE: Gray-scale sonography with compression, as well as color and duplex ultrasound, were performed to evaluate the deep venous system(s) from the level of the common femoral vein through the popliteal and proximal calf veins. COMPARISON:  None. FINDINGS: VENOUS Normal compressibility of the common femoral, superficial femoral, and popliteal veins, as well as the visualized calf veins. Visualized portions of profunda femoral vein and great saphenous vein unremarkable. No filling defects to suggest DVT on grayscale  or color Doppler imaging. Doppler waveforms show normal direction of venous flow, normal respiratory phasicity and response to augmentation. Limited views of the contralateral common femoral vein are unremarkable. OTHER Subcutaneous calf edema. Limitations: none IMPRESSION: No femoropopliteal DVT nor evidence of DVT within the visualized calf veins. If clinical symptoms are inconsistent or if there are persistent or worsening symptoms, further imaging (possibly involving the iliac veins) may be warranted. Electronically Signed   By: Lucrezia Europe M.D.   On: 04/20/2019 10:33   DG Chest Portable 1 View  Result Date: 04/20/2019 CLINICAL DATA:  Shortness of breath, history coronary artery disease, hypertension, type II diabetes mellitus, COPD, smoker EXAM: PORTABLE CHEST 1 VIEW COMPARISON:  Portable exam 0853 hours compared to 01/28/2019 FINDINGS: Lordotic positioning. Enlargement of cardiac silhouette. Mediastinal contours normal. Patchy BILATERAL pulmonary infiltrates consistent with multifocal pneumonia. No pleural effusion or pneumothorax. No acute osseous findings. IMPRESSION: Patchy BILATERAL pulmonary infiltrates consistent with multifocal pneumonia; question of COVID-19 pneumonia is raised. Electronically Signed   By: Lavonia Dana M.D.   On: 04/20/2019 09:00   ECHOCARDIOGRAM COMPLETE  Result Date: 04/20/2019    ECHOCARDIOGRAM REPORT   Patient Name:   Peter Lambert Date of Exam: 04/20/2019 Medical Rec #:  [id number]       Height:       67.0 in Accession #:    [id number]      Weight:       198.4 lb Date of Birth:  06/15/1945      BSA:          2.015 m Patient Age:    49 years        BP:           119/65 mmHg Patient Gender: M               HR:           104 bpm. Exam Location:  [facility] Procedure: 2D Echo Indications:    Dyspnea 786.09 / R06.00  History:        Patient has prior history of Echocardiogram examinations, most                 recent 07/03/2016. CAD, COPD; Risk Factors:Diabetes, Dyslipidemia  and Hypertension. Acute respiratory failure with hypoxia ,                 Peripheral arterial disease.  Sonographer:    Leavy Cella RDCS (AE) Referring Phys: [place]  1. Left ventricular ejection fraction, by estimation, is 50 to 55%. The left ventricle has low normal function. The left ventricle has no regional wall motion abnormalities. Indeterminate diastolic filling due to E-A fusion. Elevated left ventricular end-diastolic pressure.  2. Right ventricular systolic function is mildly reduced. The right ventricular size is normal. There is moderately elevated pulmonary artery systolic pressure.  3. Left atrial size was moderately dilated.  4. The mitral valve is grossly normal. Mild mitral valve regurgitation.  5. The aortic valve is tricuspid. Aortic valve regurgitation is not visualized. No aortic stenosis is present.  6. The inferior vena cava is dilated in size with >50% respiratory variability, suggesting right atrial pressure of 8 mmHg. FINDINGS  Left Ventricle: Left ventricular ejection fraction, by estimation, is 50 to 55%. The left ventricle has low normal function. The left ventricle has no regional wall motion abnormalities. The left ventricular internal cavity size was normal in size. There is no left ventricular hypertrophy. Indeterminate diastolic filling due to E-A fusion. Elevated left ventricular end-diastolic pressure. Right Ventricle: The right ventricular size is normal. No increase in right ventricular wall thickness. Right ventricular systolic function is mildly reduced. There is moderately elevated pulmonary artery systolic pressure. The tricuspid regurgitant velocity is 3.30 m/s, and with an assumed right atrial pressure of 10 mmHg, the estimated right ventricular systolic pressure is [id number] mmHg. Left Atrium: Left atrial size was moderately dilated. Right Atrium: Right atrial size was normal in size. Pericardium: There is no evidence of pericardial  effusion. Mitral Valve: The mitral valve is grossly normal. Mild mitral valve regurgitation. Tricuspid Valve: The tricuspid valve is grossly normal. Tricuspid valve regurgitation is mild. Aortic Valve: The aortic valve is tricuspid. . There is mild thickening of the aortic valve. Aortic valve regurgitation is not visualized. No aortic stenosis is present. There is mild thickening of the aortic valve. Pulmonic Valve: The pulmonic valve was grossly normal. Pulmonic valve regurgitation is not visualized. Aorta: The aortic root is normal in size and structure. Venous: The inferior vena cava is dilated in size with greater than 50% respiratory variability, suggesting right atrial pressure of 8 mmHg. IAS/Shunts: No atrial level shunt detected by color flow Doppler.  LEFT VENTRICLE PLAX 2D LVIDd:         5.04 cm  Diastology LVIDs:         4.45 cm  LV e' lateral:   8.16 cm/s LV PW:         1.28 cm  LV E/e' lateral: 10.7 LV IVS:        0.86 cm  LV e' medial:    4.03 cm/s LVOT diam:     2.10 cm  LV E/e' medial:  21.7 LVOT Area:     3.46 cm  RIGHT VENTRICLE RV S prime:     6.31 cm/s TAPSE (M-mode): 1.1 cm LEFT ATRIUM             Index       RIGHT ATRIUM           Index LA diam:        5.20 cm 2.58 cm/m  RA Area:     22.20 cm LA Vol (A2C):   68.3 ml 33.89 ml/m RA Volume:  78.40 ml  38.90 ml/m LA Vol (A4C):   80.0 ml 39.69 ml/m LA Biplane Vol: 80.7 ml 40.04 ml/m   AORTA Ao Root diam: 3.20 cm MITRAL VALVE               TRICUSPID VALVE MV Area (PHT): 4.60 cm    TR Peak grad:   43.6 mmHg MV Decel Time: 165 msec    TR Vmax:        330.00 cm/s MR Peak grad: 60.2 mmHg MR Mean grad: 42.0 mmHg    SHUNTS MR Vmax:      388.00 cm/s  Systemic Diam: 2.10 cm MR Vmean:     307.0 cm/s MV E velocity: 87.30 cm/s MV A velocity: 39.80 cm/s MV E/A ratio:  2.19 Kate Sable MD Electronically signed by Kate Sable MD Signature Date/Time: 04/20/2019/3:05:50 PM    Final    Scheduled Meds: . aspirin  81 mg Oral Daily  .  atorvastatin  20 mg Oral q1800  . budesonide (PULMICORT) nebulizer solution  0.5 mg Nebulization BID  . Chlorhexidine Gluconate Cloth  6 each Topical Daily  . clopidogrel  75 mg Oral Daily  . collagenase   Topical Daily  . dextromethorphan-guaiFENesin  1 tablet Oral BID  . doxycycline  100 mg Oral Q12H  . furosemide  40 mg Intravenous Q12H  . heparin injection (subcutaneous)  5,000 Units Subcutaneous Q8H  . insulin aspart  0-15 Units Subcutaneous TID WC  . insulin aspart  0-5 Units Subcutaneous QHS  . ipratropium  0.5 mg Nebulization Q6H  . levalbuterol  1.25 mg Nebulization Q6H  . mouth rinse  15 mL Mouth Rinse BID  . mouth rinse  15 mL Mouth Rinse BID  . methylPREDNISolone (SOLU-MEDROL) injection  60 mg Intravenous Q12H  . nicotine  14 mg Transdermal Daily  . pantoprazole  40 mg Oral Daily  . sodium chloride flush  3 mL Intravenous Q12H   Continuous Infusions: . sodium chloride    . cefTRIAXone (ROCEPHIN)  IV Stopped (04/21/19 0848)     LOS: 1 day    Time spent: 35 minutes. Greater than 50% of this time was spent in direct contact with the patient, coordinating care and discussing relevant ongoing clinical issues, including COPD exacerbation, elevated troponin, transient episode of atrial fibrillation; concern for diastolic heart failure.  Case has been discussed with the specialist (cardiology service).  At this moment we will continue triple approach with diuretics, antibiotics and the steroids/nebulization or his condition.  Continue decreasing oxygen supplementation as tolerated.  Based on cardiology advised we will discontinue heparin drip and focus just on DVT prophylaxis.Barton Dubois, MD [facility] Pager [phone]   04/21/2019, 6:22 PM

## 2019-04-21 NOTE — Consult Note (Addendum)
Cardiology Consult    Patient ID: Evan Osen Ryder; [id number]; 1946/02/25   Admit date: 04/20/2019 Date of Consult: 04/21/2019  Primary Care Provider: Redmond School, MD Primary Cardiologist: Quay Burow, MD   Patient Profile    Peter Lambert is a 74 y.o. male with past medical history of CAD (s/p NSTEMI in 2010 with DES to proximal LAD with D1 jailed and angioplasty alone to ostium), PVD (s/p prior aortobifemoral bypass graft), HTN, HLD, COPD and tobacco use who is being seen today for evaluation of elevated troponin values at the request of Dr. Dyann Kief.   History of Present Illness    Mr. Lipson most recently had a phone visit with Dr. Gwenlyn Found in 09/2018 and denied any recent chest pain or dyspnea at that time. He was continued on his current medication regimen.   He presented to [facility] ED on 04/20/2019 for evaluation of worsening dyspnea for the past week. He had initially been evaluated by his PCP and received two injections 1 week ago but symptoms did not improve. He reports worsening dyspnea at rest and with exertion. Was having to sleep with multiple pillows. Was having a productive cough. He denies any associated chest pain or palpitations.   Oxygen saturations were in the 80's on RA while in the ED. Initial labs showed WBC 8.7, Hgb 9.6, platelets 265, Na+ 138, K+ 3.7 and creatinine 1.21. D-dimer 3.81. BNP 580.  Negative for COVID and Influenza. TSH 0.905. Lactic Acid 2.7. Hgb A1c 7.6. ABG showed pH 7.462, CO2 29.6 and O2 59.1. Initial HS Troponin 1053 with repeat values of 1135, 1022 and 1116. CXR showed patchy bilateral pulmonary infiltrates consistent with multifocal PNA. Right lower extremity doppler negative for DVT. CTA negative for PE but did show multiple airspace opacities throughout both lungs most consistent with multifocal pneumonia, most likely of viral etiology. EKG on admission showed sinus tachycardia, HR 112 with PVC's and ST depression along the lateral leads  which was similar to prior tracings. An echocardiogram was performed and showed a low-normal EF of 50-55% with mildly reduced RV function and mild MR.  He was admitted for further management of his acute hypoxic respiratory failure which was thought to be secondary to PNA, COPD and CHF. He was started on IV steroids, Ceftriaxone, Rocephin and IV Lasix 40mg  BID.   Yesterday evening, HR was noted to be in the 140's and repeat EKG showed atrial flutter with 2:1 conduction, HR 139 with more prominent ST depression along the anterior leads when compared to tracings earlier in the day. He did receive IV Lopressor 5mg  and converted back to NSR prior to 2000 and has been in NSR since. He does have frequent PVC's and occasional episodes of NSVT (longest being 7 beats by review of tele) and ventricular bigeminy.   He was overall unaware of his arrhythmias yesterday evening.   Past Medical History:  Diagnosis Date  . Coronary artery disease    a. s/p NSTEMI in 2010 with DES to proximal LAD with D1 jailed and angioplasty alone to ostium  . Hyperlipidemia   . Hypertension   . Hypertension   . MI, old   . Peripheral arterial disease ([place])   . Tobacco abuse   . Type 2 diabetes mellitus ([place])     Past Surgical History:  Procedure Laterality Date  . CARDIAC SURGERY    . CORONARY STENT PLACEMENT       Home Medications:  Prior to Admission medications   Medication  Sig Start Date End Date Taking? Authorizing Provider  albuterol (PROVENTIL HFA;VENTOLIN HFA) 108 (90 Base) MCG/ACT inhaler Inhale 2 puffs into the lungs every 4 (four) hours as needed for wheezing or shortness of breath. 04/10/16  Yes Horton, Barbette Hair, MD  amLODipine (NORVASC) 10 MG tablet Take 1 tablet (10 mg total) by mouth daily. 05/29/17  Yes Lorretta Harp, MD  clopidogrel (PLAVIX) 75 MG tablet Take 75 mg by mouth daily. 09/21/18  Yes [provider]  doxazosin (CARDURA) 2 MG tablet Take 2 mg by mouth daily. 02/18/19  Yes  [provider]  indomethacin (INDOCIN) 25 MG capsule Take 1 capsule (25 mg total) by mouth 3 (three) times daily as needed. 02/27/18  Yes Delo, Nathaneil Canary, MD  KLOR-CON M20 20 MEQ tablet TAKE 1 TABLET BY MOUTH EVERY DAY 10/03/18  Yes Lorretta Harp, MD  nitroGLYCERIN (NITROSTAT) 0.4 MG SL tablet Place 1 tablet (0.4 mg total) under the tongue every 5 (five) minutes as needed for chest pain. Take 03/13/19  Yes Lorretta Harp, MD  aspirin 81 MG chewable tablet Chew 81 mg by mouth daily.      [provider]  atorvastatin (LIPITOR) 20 MG tablet Take 20 mg by mouth daily. 02/19/19   [provider]  colchicine 0.6 MG tablet TAKE 1 TAB NOW, 1 TAB IN 90 MINUTES, THEN 1 TAB TWICE A DAY UNTIL SYMPTOMS RESOLVE. 05/23/18   [provider]  ferrous sulfate 325 (65 FE) MG tablet Take 325 mg by mouth 3 (three) times daily. 05/13/17   [provider]  potassium chloride (KLOR-CON) 20 MEQ packet TAKE 1 TABLET BY MOUTH EVERY DAY 12/24/12 07/14/13  Lorretta Harp, MD    Inpatient Medications: Scheduled Meds: . aspirin  81 mg Oral Daily  . atorvastatin  20 mg Oral q1800  . budesonide (PULMICORT) nebulizer solution  0.5 mg Nebulization BID  . Chlorhexidine Gluconate Cloth  6 each Topical Daily  . clopidogrel  75 mg Oral Daily  . collagenase   Topical Daily  . dextromethorphan-guaiFENesin  1 tablet Oral BID  . doxycycline  100 mg Oral Q12H  . furosemide  40 mg Intravenous Q12H  . insulin aspart  0-15 Units Subcutaneous TID WC  . insulin aspart  0-5 Units Subcutaneous QHS  . ipratropium  0.5 mg Nebulization Q6H  . levalbuterol  1.25 mg Nebulization Q6H  . mouth rinse  15 mL Mouth Rinse BID  . mouth rinse  15 mL Mouth Rinse BID  . methylPREDNISolone (SOLU-MEDROL) injection  60 mg Intravenous Q12H  . nicotine  14 mg Transdermal Daily  . pantoprazole  40 mg Oral Daily  . sodium chloride flush  3 mL Intravenous Q12H   Continuous Infusions: . sodium chloride    .  cefTRIAXone (ROCEPHIN)  IV 1 g (04/21/19 0813)  . heparin 1,200 Units/hr (04/20/19 2004)   PRN Meds: sodium chloride, acetaminophen **OR** acetaminophen, ALPRAZolam, nitroGLYCERIN, ondansetron **OR** ondansetron (ZOFRAN) IV, sodium chloride flush  Allergies:   No Known Allergies  Social History:   Social History   Socioeconomic History  . Marital status: Divorced    Spouse name: Not on file  . Number of children: Not on file  . Years of education: Not on file  . Highest education level: Not on file  Occupational History  . Not on file  Tobacco Use  . Smoking status: Current Every Day Smoker    Packs/day: 0.50    Years: 52.00    Pack years:  26.00    Types: Cigarettes  . Smokeless tobacco: Never Used  Substance and Sexual Activity  . Alcohol use: No  . Drug use: No  . Sexual activity: Never  Other Topics Concern  . Not on file  Social History Narrative  . Not on file   Social Determinants of Health   Financial Resource Strain:   . Difficulty of Paying Living Expenses: Not on file  Food Insecurity:   . Worried About Charity fundraiser in the Last Year: Not on file  . Ran Out of Food in the Last Year: Not on file  Transportation Needs:   . Lack of Transportation (Medical): Not on file  . Lack of Transportation (Non-Medical): Not on file  Physical Activity:   . Days of Exercise per Week: Not on file  . Minutes of Exercise per Session: Not on file  Stress:   . Feeling of Stress : Not on file  Social Connections:   . Frequency of Communication with Friends and Family: Not on file  . Frequency of Social Gatherings with Friends and Family: Not on file  . Attends Religious Services: Not on file  . Active Member of Clubs or Organizations: Not on file  . Attends Archivist Meetings: Not on file  . Marital Status: Not on file  Intimate Partner Violence:   . Fear of Current or Ex-Partner: Not on file  . Emotionally Abused: Not on file  . Physically Abused: Not  on file  . Sexually Abused: Not on file     Family History:    Family History  Problem Relation Age of Onset  . Cancer Mother       Review of Systems    General:  No chills, fever, night sweats or weight changes.  Cardiovascular:  No chest pain, edema, orthopnea, palpitations, paroxysmal nocturnal dyspnea. Positive for dyspnea on exertion.  Dermatological: No rash, lesions/masses Respiratory: Positive for cough and dyspnea Urologic: No hematuria, dysuria Abdominal:   No nausea, vomiting, diarrhea, bright red blood per rectum, melena, or hematemesis Neurologic:  No visual changes, wkns, changes in mental status. All other systems reviewed and are otherwise negative except as noted above.  Physical Exam/Data    Vitals:   04/21/19 0751 04/21/19 0800 04/21/19 0820 04/21/19 0822  BP:  113/69    Pulse: 92 92  91  Resp: (!) 32 (!) 37  (!) 33  Temp: 97.9 F (36.6 C)     TempSrc: Oral     SpO2: 92% (!) 87% 92% 91%  Weight:      Height:        Intake/Output Summary (Last 24 hours) at 04/21/2019 1032 Last data filed at 04/21/2019 0806 Gross per 24 hour  Intake 1225.69 ml  Output 1725 ml  Net -499.31 ml   Filed Weights   04/20/19 0912 04/20/19 1549  Weight: 90 kg 83.3 kg   Body mass index is 27.92 kg/m.   General: Pleasant, African American male appearing in NAD Psych: Normal affect. Neuro: Alert and oriented X 3. Moves all extremities spontaneously. HEENT: Normal  Neck: Supple without bruits. JVD at 9cm. Lungs:  Resp regular and unlabored, rales along bases bilaterally with expiratory wheezing. Heart: RRR with frequent ectopic beats. no s3, s4, or murmurs. Abdomen: Soft, non-tender, non-distended, BS + x 4.  Extremities: No clubbing or cyanosis. 2+ pitting edema along RLE, 1+ along LLE. DP/PT/Radials 2+ and equal bilaterally.   EKG:  The EKG was personally reviewed and demonstrates:  Sinus tachycardia, HR 112 with PVC's and ST depression along the lateral leads which  was similar to prior tracings.   Telemetry:  Telemetry was personally reviewed and demonstrates: As above   Labs/Studies     Relevant CV Studies:  Echocardiogram: 04/20/2019 IMPRESSIONS    1. Left ventricular ejection fraction, by estimation, is 50 to 55%. The  left ventricle has low normal function. The left ventricle has no regional  wall motion abnormalities. Indeterminate diastolic filling due to E-A  fusion. Elevated left ventricular  end-diastolic pressure.  2. Right ventricular systolic function is mildly reduced. The right  ventricular size is normal. There is moderately elevated pulmonary artery  systolic pressure.  3. Left atrial size was moderately dilated.  4. The mitral valve is grossly normal. Mild mitral valve regurgitation.  5. The aortic valve is tricuspid. Aortic valve regurgitation is not  visualized. No aortic stenosis is present.  6. The inferior vena cava is dilated in size with >50% respiratory  variability, suggesting right atrial pressure of 8 mmHg.   Laboratory Data:  Chemistry Recent Labs  Lab 04/20/19 0846 04/20/19 1206 04/21/19 0401  NA 138  --  138  K 3.7  --  4.6  CL 107  --  107  CO2 21*  --  20*  GLUCOSE 153*  --  231*  BUN 16  --  25*  CREATININE 1.21 1.22 1.48*  CALCIUM 8.3*  --  8.5*  GFRNONAA 58* 58* 46*  GFRAA >60 >60 53*  ANIONGAP 10  --  11    Recent Labs  Lab 04/20/19 0846  PROT 7.3  ALBUMIN 2.8*  AST 25  ALT 39  ALKPHOS 98  BILITOT 0.7   Hematology Recent Labs  Lab 04/20/19 0846 04/20/19 1206 04/21/19 0401  WBC 8.7 10.0 11.0*  RBC 3.52* 3.35* 3.22*  HGB 9.6* 9.3* 8.9*  HCT 31.6* 29.7* 28.9*  MCV 89.8 88.7 89.8  MCH 27.3 27.8 27.6  MCHC 30.4 31.3 30.8  RDW 15.8* 15.7* 15.9*  PLT 265 251 266   Cardiac EnzymesNo results for input(s): TROPONINI in the last 168 hours. No results for input(s): TROPIPOC in the last 168 hours.  BNP Recent Labs  Lab 04/20/19 0846  BNP 580.0*    DDimer  Recent Labs   Lab 04/20/19 0846  DDIMER 3.81*    Radiology/Studies:  CT Angio Chest PE W and/or Wo Contrast  Result Date: 04/20/2019 CLINICAL DATA:  Shortness of breath. EXAM: CT ANGIOGRAPHY CHEST WITH CONTRAST TECHNIQUE: Multidetector CT imaging of the chest was performed using the standard protocol during bolus administration of intravenous contrast. Multiplanar CT image reconstructions and MIPs were obtained to evaluate the vascular anatomy. CONTRAST:  173mL OMNIPAQUE IOHEXOL 350 MG/ML SOLN COMPARISON:  None. FINDINGS: Cardiovascular: Satisfactory opacification of the pulmonary arteries to the segmental level. No evidence of pulmonary embolism. Normal heart size. No pericardial effusion. Atherosclerosis of thoracic aorta is noted without aneurysm or dissection. Mediastinum/Nodes: Thyroid gland and esophagus are unremarkable. Enlarged pre-vascular a subcarinal adenopathy is noted which most likely is inflammatory in etiology. Lungs/Pleura: No pneumothorax is noted. Minimal bilateral pleural effusions are noted. Multiple airspace opacities are noted throughout both lungs most consistent with multifocal pneumonia, most likely of viral etiology. Upper Abdomen: No acute abnormality. Musculoskeletal: No chest wall abnormality. No acute or significant osseous findings. Review of the MIP images confirms the above findings. IMPRESSION: 1. No definite evidence of pulmonary embolus. 2. Multiple airspace opacities are noted throughout both lungs most consistent with multifocal  pneumonia, most likely of viral etiology. 3. Minimal bilateral pleural effusions are noted. 4. Enlarged mediastinal adenopathy is noted which most likely is inflammatory in etiology. Aortic Atherosclerosis (ICD10-I70.0). Electronically Signed   By: Marijo Conception M.D.   On: 04/20/2019 10:54   US Venous Img Lower Unilateral Right  Result Date: 04/20/2019 CLINICAL DATA:  Swelling x5 days EXAM: RIGHT LOWER EXTREMITY VENOUS DOPPLER ULTRASOUND TECHNIQUE:  Gray-scale sonography with compression, as well as color and duplex ultrasound, were performed to evaluate the deep venous system(s) from the level of the common femoral vein through the popliteal and proximal calf veins. COMPARISON:  None. FINDINGS: VENOUS Normal compressibility of the common femoral, superficial femoral, and popliteal veins, as well as the visualized calf veins. Visualized portions of profunda femoral vein and great saphenous vein unremarkable. No filling defects to suggest DVT on grayscale or color Doppler imaging. Doppler waveforms show normal direction of venous flow, normal respiratory phasicity and response to augmentation. Limited views of the contralateral common femoral vein are unremarkable. OTHER Subcutaneous calf edema. Limitations: none IMPRESSION: No femoropopliteal DVT nor evidence of DVT within the visualized calf veins. If clinical symptoms are inconsistent or if there are persistent or worsening symptoms, further imaging (possibly involving the iliac veins) may be warranted. Electronically Signed   By: Lucrezia Europe M.D.   On: 04/20/2019 10:33   DG Chest Portable 1 View  Result Date: 04/20/2019 CLINICAL DATA:  Shortness of breath, history coronary artery disease, hypertension, type II diabetes mellitus, COPD, smoker EXAM: PORTABLE CHEST 1 VIEW COMPARISON:  Portable exam 0853 hours compared to 01/28/2019 FINDINGS: Lordotic positioning. Enlargement of cardiac silhouette. Mediastinal contours normal. Patchy BILATERAL pulmonary infiltrates consistent with multifocal pneumonia. No pleural effusion or pneumothorax. No acute osseous findings. IMPRESSION: Patchy BILATERAL pulmonary infiltrates consistent with multifocal pneumonia; question of COVID-19 pneumonia is raised. Electronically Signed   By: Lavonia Dana M.D.   On: 04/20/2019 09:00   ECHOCARDIOGRAM COMPLETE  Result Date: 04/20/2019    ECHOCARDIOGRAM REPORT   Patient Name:   LOCKLAN REILLEY Date of Exam: 04/20/2019 Medical Rec  #:  [id number]       Height:       67.0 in Accession #:    [id number]      Weight:       198.4 lb Date of Birth:  December 23, 1945      BSA:          2.015 m Patient Age:    24 years        BP:           119/65 mmHg Patient Gender: M               HR:           104 bpm. Exam Location:  [facility] Procedure: 2D Echo Indications:    Dyspnea 786.09 / R06.00  History:        Patient has prior history of Echocardiogram examinations, most                 recent 07/03/2016. CAD, COPD; Risk Factors:Diabetes, Dyslipidemia                 and Hypertension. Acute respiratory failure with hypoxia ,                 Peripheral arterial disease.  Sonographer:    Leavy Cella RDCS (AE) Referring Phys: [place]  1. Left ventricular ejection fraction, by estimation, is 50  to 55%. The left ventricle has low normal function. The left ventricle has no regional wall motion abnormalities. Indeterminate diastolic filling due to E-A fusion. Elevated left ventricular end-diastolic pressure.  2. Right ventricular systolic function is mildly reduced. The right ventricular size is normal. There is moderately elevated pulmonary artery systolic pressure.  3. Left atrial size was moderately dilated.  4. The mitral valve is grossly normal. Mild mitral valve regurgitation.  5. The aortic valve is tricuspid. Aortic valve regurgitation is not visualized. No aortic stenosis is present.  6. The inferior vena cava is dilated in size with >50% respiratory variability, suggesting right atrial pressure of 8 mmHg. FINDINGS  Left Ventricle: Left ventricular ejection fraction, by estimation, is 50 to 55%. The left ventricle has low normal function. The left ventricle has no regional wall motion abnormalities. The left ventricular internal cavity size was normal in size. There is no left ventricular hypertrophy. Indeterminate diastolic filling due to E-A fusion. Elevated left ventricular end-diastolic pressure. Right Ventricle: The right  ventricular size is normal. No increase in right ventricular wall thickness. Right ventricular systolic function is mildly reduced. There is moderately elevated pulmonary artery systolic pressure. The tricuspid regurgitant velocity is 3.30 m/s, and with an assumed right atrial pressure of 10 mmHg, the estimated right ventricular systolic pressure is [id number] mmHg. Left Atrium: Left atrial size was moderately dilated. Right Atrium: Right atrial size was normal in size. Pericardium: There is no evidence of pericardial effusion. Mitral Valve: The mitral valve is grossly normal. Mild mitral valve regurgitation. Tricuspid Valve: The tricuspid valve is grossly normal. Tricuspid valve regurgitation is mild. Aortic Valve: The aortic valve is tricuspid. . There is mild thickening of the aortic valve. Aortic valve regurgitation is not visualized. No aortic stenosis is present. There is mild thickening of the aortic valve. Pulmonic Valve: The pulmonic valve was grossly normal. Pulmonic valve regurgitation is not visualized. Aorta: The aortic root is normal in size and structure. Venous: The inferior vena cava is dilated in size with greater than 50% respiratory variability, suggesting right atrial pressure of 8 mmHg. IAS/Shunts: No atrial level shunt detected by color flow Doppler.  LEFT VENTRICLE PLAX 2D LVIDd:         5.04 cm  Diastology LVIDs:         4.45 cm  LV e' lateral:   8.16 cm/s LV PW:         1.28 cm  LV E/e' lateral: 10.7 LV IVS:        0.86 cm  LV e' medial:    4.03 cm/s LVOT diam:     2.10 cm  LV E/e' medial:  21.7 LVOT Area:     3.46 cm  RIGHT VENTRICLE RV S prime:     6.31 cm/s TAPSE (M-mode): 1.1 cm LEFT ATRIUM             Index       RIGHT ATRIUM           Index LA diam:        5.20 cm 2.58 cm/m  RA Area:     22.20 cm LA Vol (A2C):   68.3 ml 33.89 ml/m RA Volume:   78.40 ml  38.90 ml/m LA Vol (A4C):   80.0 ml 39.69 ml/m LA Biplane Vol: 80.7 ml 40.04 ml/m   AORTA Ao Root diam: 3.20 cm MITRAL VALVE                TRICUSPID VALVE MV Area (PHT):  4.60 cm    TR Peak grad:   43.6 mmHg MV Decel Time: 165 msec    TR Vmax:        330.00 cm/s MR Peak grad: 60.2 mmHg MR Mean grad: 42.0 mmHg    SHUNTS MR Vmax:      388.00 cm/s  Systemic Diam: 2.10 cm MR Vmean:     307.0 cm/s MV E velocity: 87.30 cm/s MV A velocity: 39.80 cm/s MV E/A ratio:  2.19 Kate Sable MD Electronically signed by Kate Sable MD Signature Date/Time: 04/20/2019/3:05:50 PM    Final      Assessment & Plan    1. Elevated Troponin Values in the setting of Known CAD - he is s/p NSTEMI in 2010 with DES to proximal LAD with D1 jailed and angioplasty alone to the ostium. He presented this admission for worsening dyspnea on exertion and at rest, found to have PNA, COPD exacerbation and CHF exacerbation.  - Initial HS Troponin 1053 with repeat values of 1135, 1022 and 1116. EKG on admission showed sinus tachycardia, HR 112 with PVC's and ST depression along the lateral leads which was similar to prior tracings. Echo shows a low-normal EF of 50-55% with mildly reduced RV function and mild MR. - he denies any recent chest pain but is at risk for progressive CAD given his multiple cardiac risk factors and continued tobacco use. Suspect his enzyme trend this admission is secondary to demand ischemia in the setting of his acute hypoxic respiratory failure. Once recovered from his acute illness, would recommend an outpatient Lexiscan Myoview (wheezing on examination so this would have to be resolved prior to pursuing stress testing).  - continue ASA, Plavix, and statin therapy. He was started on Heparin and this can be discontinued after 48 hours.   2. Brief Episodes of Atrial Flutter/Ventricular Ectopy - on 2/22, HR was in the 140's and repeat EKG showed atrial flutter with 2:1 conduction, HR 139 with more prominent ST depression along the anterior leads when compared to tracings earlier in the day. Telemetry review at that time consistent with  flutter as well. He did receive IV Lopressor 5mg  and converted back to NSR prior to 2000 and has been in NSR since with the episode lasting less than 2 hours.  - if no recurrent events, would not anticipate committing to long-term anticoagulation. Not on a BB given wheezing but could consider adding low-dose Lopressor once respiratory status improves.  - he does have frequent PVC's and occasional episodes of NSVT (longest being 7 beats by review of tele) and ventricular bigeminy. Mg was 1.8 and this has been replaced. Keep Mg ~ 2.0 and K+ ~ 4.0.  3. Acute on Chronic Diastolic CHF Exacerbation - BNP elevated to 580 and CXR showed patchy bilateral pulmonary infiltrates consistent with multifocal PNA. He does have rales on examination along with significant edema along his RLE and dopplers were negative for a DVT.  - agree with IV Lasix 40mg  BID. Creatinine did trend upwards from 1.22 to 1.48, so would reduce dosing to once daily tomorrow if not improved. I&O's have not been accurate secondary to incontinence and recorded weights have been variable.  4. HTN - BP has been variable at 101/50 - 137/84 within the past 24 hours. He was on Amlodipine 10mg  daily prior to admission which has been held given his soft readings.   5. PNA/COPD Exacerbation - he remains on IV steroids along with Ceftriaxone and Rocephin. Further management per admitting team.  For questions or updates, please contact [place] Please consult [email or web address] for contact info under Cardiology/STEMI.  Signed, Erma Heritage, PA-C 04/21/2019, 10:32 AM Pager: [phone]  The patient was seen and examined, and I agree with the history, physical exam, assessment and plan as documented above, with modifications made above and as noted below. I have also personally reviewed all relevant documentation, old records, labs, and both radiographic and cardiovascular studies. I have also independently interpreted old and new  ECG's.  Briefly, this is a 74 year old male with a history of coronary disease and non-STEMI in 2010 with drug-eluting stent placement to the proximal LAD with jailing of the first diagonal branch with angioplasty to the ostium.  He also has peripheral vascular disease, hypertension, hyperlipidemia, COPD, and ongoing tobacco use.  I was called by the ED physician yesterday as he had a troponin elevation.  Both chest x-ray and chest CT showed evidence for multifocal pneumonia.  He is negative for COVID-19.  He denies exertional chest pain.  He also denies palpitations.  He told me he received some injections last week and after that he developed leg swelling.  He is also had orthopnea and progressive exertional dyspnea.  Labs reviewed above with high-sensitivity troponins in the 1000 range.  Physical exam notable for wheezing and bilateral leg edema, right greater than left.  Right lower extremity Dopplers were negative for DVT.  I personally reviewed the ECG which demonstrated sinus tachycardia with PVCs and ST segment depressions in anterolateral leads.  He is being treated with antimicrobial therapy, IV steroids, and IV Lasix.  He developed rapid atrial flutter yesterday evening and was given IV metoprolol and has since been in sinus rhythm.  He does have runs of nonsustained ventricular tachycardia.  Overall he said he is feeling better.  His legs are still swollen.  I personally reviewed the echocardiogram performed yesterday which demonstrated low normal LV systolic function, EF 50 to 55%.  There was no regional wall variation.  RV systolic function was mildly reduced and there was moderate pulmonary arterial hypertension.  Recommendations: Given that the episode of rapid atrial flutter was an isolated instance and likely provoked by multifocal pneumonia, I would not initiate oral systemic anticoagulation.  Given wheezing on physical exam, I would avoid beta-blockers for now but this  could be considered as he continues to improve.  Otherwise, if BP tolerates, could add low-dose calcium channel blocker (if this is done, I would not resume home amlodipine).  As he denies anginal symptoms altogether, I suspect troponin elevation is indicative of demand ischemia in the context of multifocal pneumonia with acute hypoxic respiratory failure. Could continue IV heparin for a total of 48 hours (hemoglobin 8.9) but it could be stopped earlier. One could consider an outpatient nuclear stress test but I will defer this to his primary cardiologist.  Otherwise, continue aspirin, clopidogrel, and statin.   He also has acute diastolic heart failure (BNP 580) and I would cautiously continue IV diuresis with close monitoring of renal function (creatinine 1.48). I doubt accuracy of weights and I's/O as he said he has been urinating frequently since starting IV Lasix.  He will also need outpatient treatment for type 2 diabetes mellitus as hemoglobin A1c is 7.6%.  Kate Sable, MD, [facility]  04/21/2019 11:08 AM

## 2019-04-21 NOTE — Progress Notes (Signed)
[place] for Heparin Indication: atrial fibrillation  Assessment:  74 yr male presents with SOB, wheezing and lower extremity swelling  PMH significant for HTN, HLD, CAD s/p MI, DM, GERD, + tobacco use   Pharmacy consulted to dose IV heparin for AFib  Initial heparin level in goal range 0.3 units/ml  Goal of Therapy:  Heparin level 0.3-0.7 units/ml Monitor platelets by anticoagulation protocol: Yes   Plan:   Continue heparin @ 1200 units/hr  Check heparin level later today to confirm  Follow heparin level and CBC daily while on heparin gtt  Dennise Raabe, Alex Gardener, PharmD 04/21/2019,4:55 AM

## 2019-04-22 DIAGNOSIS — J181 Lobar pneumonia, unspecified organism: Secondary | ICD-10-CM

## 2019-04-22 LAB — BASIC METABOLIC PANEL
Anion gap: 10 (ref 5–15)
BUN: 36 mg/dL — ABNORMAL HIGH (ref 8–23)
CO2: 21 mmol/L — ABNORMAL LOW (ref 22–32)
Calcium: 8.7 mg/dL — ABNORMAL LOW (ref 8.9–10.3)
Chloride: 105 mmol/L (ref 98–111)
Creatinine, Ser: 1.54 mg/dL — ABNORMAL HIGH (ref 0.61–1.24)
GFR calc Af Amer: 50 mL/min — ABNORMAL LOW (ref >60)
GFR calc non Af Amer: 43 mL/min — ABNORMAL LOW (ref >60)
Glucose, Bld: 219 mg/dL — ABNORMAL HIGH (ref 70–99)
Potassium: 4.2 mmol/L (ref 3.5–5.1)
Sodium: 136 mmol/L (ref 135–145)

## 2019-04-22 LAB — GLUCOSE, CAPILLARY
Glucose-Capillary: 208 mg/dL — ABNORMAL HIGH (ref 70–99)
Glucose-Capillary: 234 mg/dL — ABNORMAL HIGH (ref 70–99)
Glucose-Capillary: 269 mg/dL — ABNORMAL HIGH (ref 70–99)
Glucose-Capillary: 158 mg/dL — ABNORMAL HIGH (ref 70–99)

## 2019-04-22 LAB — CBC
HCT: 29.4 % — ABNORMAL LOW (ref 39.0–52.0)
Hemoglobin: 9.1 g/dL — ABNORMAL LOW (ref 13.0–17.0)
MCH: 27 pg (ref 26.0–34.0)
MCHC: 31 g/dL (ref 30.0–36.0)
MCV: 87.2 fL (ref 80.0–100.0)
Platelets: 342 10*3/uL (ref 150–400)
RBC: 3.37 MIL/uL — ABNORMAL LOW (ref 4.22–5.81)
RDW: 15.9 % — ABNORMAL HIGH (ref 11.5–15.5)
WBC: 17.7 10*3/uL — ABNORMAL HIGH (ref 4.0–10.5)
nRBC: 0.3 % — ABNORMAL HIGH (ref 0.0–0.2)

## 2019-04-22 LAB — MAGNESIUM: Magnesium: 2.2 mg/dL (ref 1.7–2.4)

## 2019-04-22 LAB — PROCALCITONIN: Procalcitonin: 0.1 ng/mL

## 2019-04-22 MED ORDER — IPRATROPIUM BROMIDE 0.02 % IN SOLN
0.5000 mg | RESPIRATORY_TRACT | Status: DC
  Administered 2019-04-22 – 2019-04-23 (×6): 0.5 mg via RESPIRATORY_TRACT
  Filled 2019-04-22 (×7): qty 2.5

## 2019-04-22 MED ORDER — ARFORMOTEROL TARTRATE 15 MCG/2ML IN NEBU
15.0000 ug | INHALATION_SOLUTION | Freq: Two times a day (BID) | RESPIRATORY_TRACT | Status: DC
  Administered 2019-04-22 – 2019-04-23 (×3): 15 ug via RESPIRATORY_TRACT
  Filled 2019-04-22 (×3): qty 2

## 2019-04-22 MED ORDER — AZITHROMYCIN 250 MG PO TABS
500.0000 mg | ORAL_TABLET | Freq: Every day | ORAL | Status: DC
  Administered 2019-04-22 – 2019-04-23 (×2): 500 mg via ORAL
  Filled 2019-04-22 (×2): qty 2

## 2019-04-22 MED ORDER — LEVALBUTEROL HCL 1.25 MG/0.5ML IN NEBU
1.2500 mg | INHALATION_SOLUTION | RESPIRATORY_TRACT | Status: DC
  Administered 2019-04-22 – 2019-04-23 (×6): 1.25 mg via RESPIRATORY_TRACT
  Filled 2019-04-22 (×8): qty 0.5

## 2019-04-22 NOTE — Progress Notes (Signed)
Pt continues to desat to 82% on 6LNC when sleeping. Placed pt on HFNC @ 10L per respriatory, SpO2 increased to 92% while asleep. Will continue to monitor.

## 2019-04-22 NOTE — Progress Notes (Signed)
    Dr. Bronson Ing reviewed with the admitting team and there are no new Cardiology recommendations at this time. Lasix dosing has been reduced given rise in creatinine. Telemetry reviewed and he has been in NSR with no recurrence of atrial flutter. He does have frequent ectopy with episodes of NSVT up to 5 beats and occasional episodes of ventricular bigeminy. K+ 4.2 and will add Mg to AM labs. Keep K+ ~ 4.0 and Mg ~ 2.0.  [facility] will sign off.   Medication Recommendations: As outlined in consult note. Other recommendations (labs, testing, etc): Recheck Mg as above.  Follow up as an outpatient: Will arrange for Hospital Follow-up with Dr. Gwenlyn Found or an APP on his team to discuss stress testing as an outpatient once improved from his acute illness.   Signed, Erma Heritage, PA-C 04/22/2019, 8:12 AM Pager: [phone]

## 2019-04-22 NOTE — Progress Notes (Signed)
PROGRESS NOTE  Peter Lambert [id number] DOB: 11/11/1945 DOA: 04/20/2019 PCP: Redmond School, MD  Brief History:  74 y.o.malewith a past medical history significant for tobacco abuse, hypertension, hyperlipidemia, history of coronary disease/myocardial infarction in the past, type 2 diabetes mellitus and gastroesophageal reflux disease; who presented to the hospital secondary to approximately 7-8 days of worsening shortness of breath. Patient has been seen at the office of his PCP where he received 2 shots for what appears to be COPD exacerbation without significant improvement and continues worsening in his breathing status. Patient reports some lower extremity edema, orthopnea and short of breath on exertion. He expressed increased wheezing and no response to his home bronchodilator inhaler. Patient denies any fever, chills, rhinorrhea, chest pain, abdominal pain, dysuria, hematuria, melena, hematochezia, headaches, focal weakness or any other complaints. In the ED he was found to be hypoxic in the high 80s on room air and very tachypneic with diffuse wheezing.Covid test was negative.  Chest x-ray demonstrated bilateral patchy infiltrates and congestion;troponin significantly elevated in the 1000 range and also elevated BNP. He was not febrile and with normal WBCs. Renal function within normal limits. Patient has a lactic acid of 2.7. Cultures retrieved, cardiology service consulted, IV antibiotics started and one-time dose of Lasix given. Patient also received albuterol nebulization and steroids. [facility] contacted to admit patient for further evaluation and management.  Assessment/Plan:  acute respiratory failure with hypoxia -secondary to CHF, PNA, COPD exacerbation -continue steroids, IV diuresis, pulmicort, bronchodilators and flutter valve -will continue IV abx's and follow clinical improvement -wean O2 down as tolerated -04/20/2019 CTA chest--negative PE,  multiple airspace opacities bilateral, minimal bilateral pleural effusion, enlarged mediastinal lymph nodes  Acute on chronic diastolic CHF -Continue IV furosemide 20 mg IV twice daily -Remains clinically fluid overloaded -04/20/2019 echo EF 50-55%, indeterminate diastolic function, mild decreased RV function -Follow daily weights, strict I's and O's and low-sodium diet  Lobar pneumonia -Check procalcitonin -Continue ceftriaxone -Restart azithromycin  COPD exacerbation -Continue Pulmicort -Continue Xopenex and Atrovent -Add Brovana   type 2 diabetes with hyperglycemia -A1c 7.6 -Holding oral hypoglycemic agents while inpatient -Continue the use of sliding scale insulin and follow CBG fluctuation.  nonsustained atrial flutter with RVR -converted back to sinus after 1 dose of metoprolol -likely due to acute medical illness -Per cardiology recommendations no need for anticoagulation at this time  -received heparin drip x 24-48 hours. -Continue monitoring on telemetry -albuterol discontinued and patient started on xopenex.   coronary artery disease/Elevated troponin -elevated troponin in the setting of demand ischemia -Personally reviewed EKG--sinus rhythm, ST depression V2-V5 -2D echo reassuring with no wall motion abnormalities and preserved ejection fraction. -Continue the use of aspirin, statins and Plavix. -Follow-up with cardiology service with intention to pursuit nuclear stress test.  tobacco abuse -Cessation counseling has been provided-patient reports completely quitting smoking approximately 3-4 weeks ago -Encouraged to keep himself smoking free.  gastroesophageal flux disease -Continue PPI.  lactic acidosis  -peaked at 2.7 -most likely from hypoxia  -Remains afebrile hemodynamically stable   Right Leg Edema -venous duplex--neg DVT      Disposition Plan:  Remain in SDU Patient From: Home D/C Place: Home - 2-3  Days Barriers: Not Clinically  Stable--requiring 10L HFNC  Family Communication:   No Family at bedside  Consultants:  none  Code Status:  FULL   DVT Prophylaxis:  [place] Heparin / [place] Lovenox   Procedures: As Listed in Progress Note Above  Antibiotics: None  RN Pressure Injury Documentation:        Subjective:   Objective: Vitals:   04/22/19 0304 04/22/19 0317 04/22/19 0400 04/22/19 0413  BP:   128/71   Pulse:   96   Resp:   (!) 23   Temp:      TempSrc:      SpO2: (!) 82% 96% 96% 93%  Weight:      Height:        Intake/Output Summary (Last 24 hours) at 04/22/2019 0803 Last data filed at 04/22/2019 0300 Gross per 24 hour  Intake 226.92 ml  Output 950 ml  Net -723.08 ml   Weight change:  Exam:   General:  Pt is alert, follows commands appropriately, not in acute distress  HEENT: No icterus, No thrush, No neck mass, [place]/AT  Cardiovascular: RRR, S1/S2, no rubs, no gallops  Respiratory: CTA bilaterally, no wheezing, no crackles, no rhonchi  Abdomen: Soft/+BS, non tender, non distended, no guarding  Extremities: No edema, No lymphangitis, No petechiae, No rashes, no synovitis   Data Reviewed: I have personally reviewed following labs and imaging studies Basic Metabolic Panel: Recent Labs  Lab 04/20/19 0846 04/20/19 1206 04/20/19 1455 04/21/19 0401 04/22/19 0426  NA 138  --   --  138 136  K 3.7  --   --  4.6 4.2  CL 107  --   --  107 105  CO2 21*  --   --  20* 21*  GLUCOSE 153*  --   --  231* 219*  BUN 16  --   --  25* 36*  CREATININE 1.21 1.22  --  1.48* 1.54*  CALCIUM 8.3*  --   --  8.5* 8.7*  MG  --   --  1.8  --   --    Liver Function Tests: Recent Labs  Lab 04/20/19 0846  AST 25  ALT 39  ALKPHOS 98  BILITOT 0.7  PROT 7.3  ALBUMIN 2.8*   No results for input(s): LIPASE, AMYLASE in the last 168 hours. No results for input(s): AMMONIA in the last 168 hours. Coagulation Profile: Recent Labs  Lab 04/20/19 0846  INR 1.3*   CBC: Recent Labs  Lab  04/20/19 0846 04/20/19 1206 04/21/19 0401 04/22/19 0426  WBC 8.7 10.0 11.0* 17.7*  NEUTROABS 6.6  --   --   --   HGB 9.6* 9.3* 8.9* 9.1*  HCT 31.6* 29.7* 28.9* 29.4*  MCV 89.8 88.7 89.8 87.2  PLT 265 251 266 342   Cardiac Enzymes: No results for input(s): CKTOTAL, CKMB, CKMBINDEX, TROPONINI in the last 168 hours. BNP: Invalid input(s): POCBNP CBG: Recent Labs  Lab 04/21/19 0750 04/21/19 1117 04/21/19 1615 04/21/19 2123 04/22/19 0746  GLUCAP 210* 213* 183* 188* 208*   HbA1C: Recent Labs    04/20/19 1455  HGBA1C 7.6*   Urine analysis: No results found for: COLORURINE, APPEARANCEUR, LABSPEC, PHURINE, GLUCOSEU, HGBUR, BILIRUBINUR, KETONESUR, PROTEINUR, UROBILINOGEN, NITRITE, LEUKOCYTESUR Sepsis Labs: @LABRCNTIP (procalcitonin:4,lacticidven:4) ) Recent Results (from the past 240 hour(s))  Respiratory Panel by RT PCR (Flu A&B, Covid) - Nasopharyngeal Swab     Status: None   Collection Time: 04/20/19  9:28 AM   Specimen: Nasopharyngeal Swab  Result Value Ref Range Status   SARS Coronavirus 2 by RT PCR NEGATIVE NEGATIVE Final    Comment: (NOTE) SARS-CoV-2 target nucleic acids are NOT DETECTED. The SARS-CoV-2 RNA is generally detectable in upper respiratoy specimens during the acute phase of infection. The lowest concentration of  SARS-CoV-2 viral copies this assay can detect is 131 copies/mL. A negative result does not preclude SARS-Cov-2 infection and should not be used as the sole basis for treatment or other patient management decisions. A negative result may occur with  improper specimen collection/handling, submission of specimen other than nasopharyngeal swab, presence of viral mutation(s) within the areas targeted by this assay, and inadequate number of viral copies (<131 copies/mL). A negative result must be combined with clinical observations, patient history, and epidemiological information. The expected result is Negative. Fact Sheet for Patients:   PinkCheek.be Fact Sheet for Healthcare Providers:  GravelBags.it This test is not yet ap proved or cleared by the [place] FDA and  has been authorized for detection and/or diagnosis of SARS-CoV-2 by FDA under an Emergency Use Authorization (EUA). This EUA will remain  in effect (meaning this test can be used) for the duration of the COVID-19 declaration under Section 564(b)(1) of the Act, 21 U.S.C. section 360bbb-3(b)(1), unless the authorization is terminated or revoked sooner.    Influenza A by PCR NEGATIVE NEGATIVE Final   Influenza B by PCR NEGATIVE NEGATIVE Final    Comment: (NOTE) The Xpert Xpress SARS-CoV-2/FLU/RSV assay is intended as an aid in  the diagnosis of influenza from Nasopharyngeal swab specimens and  should not be used as a sole basis for treatment. Nasal washings and  aspirates are unacceptable for Xpert Xpress SARS-CoV-2/FLU/RSV  testing. Fact Sheet for Patients: PinkCheek.be Fact Sheet for Healthcare Providers: GravelBags.it This test is not yet approved or cleared by the [place] FDA and  has been authorized for detection and/or diagnosis of SARS-CoV-2 by  FDA under an Emergency Use Authorization (EUA). This EUA will remain  in effect (meaning this test can be used) for the duration of the  Covid-19 declaration under Section 564(b)(1) of the Act, 21  U.S.C. section 360bbb-3(b)(1), unless the authorization is  terminated or revoked. Performed at [facility], [address] [place], [place] [address]   Blood culture (routine x 2)     Status: None (Preliminary result)   Collection Time: 04/20/19 10:57 AM   Specimen: BLOOD LEFT ARM  Result Value Ref Range Status   Specimen Description BLOOD LEFT ARM  Final   Special Requests   Final    BOTTLES DRAWN AEROBIC AND ANAEROBIC Blood Culture results may not be optimal due to an inadequate  volume of blood received in culture bottles   Culture   Final    NO GROWTH 2 DAYS Performed at [facility], [address] [place], [place] [address]    Report Status PENDING  Incomplete  Blood culture (routine x 2)     Status: None (Preliminary result)   Collection Time: 04/20/19 11:02 AM   Specimen: BLOOD LEFT WRIST  Result Value Ref Range Status   Specimen Description BLOOD LEFT WRIST  Final   Special Requests   Final    BOTTLES DRAWN AEROBIC AND ANAEROBIC Blood Culture results may not be optimal due to an inadequate volume of blood received in culture bottles   Culture   Final    NO GROWTH 2 DAYS Performed at [facility], [address] [place],  [address]    Report Status PENDING  Incomplete  MRSA PCR Screening     Status: None   Collection Time: 04/20/19  3:43 PM   Specimen: Nasal Mucosa; Nasopharyngeal  Result Value Ref Range Status   MRSA by PCR NEGATIVE NEGATIVE Final    Comment:  The GeneXpert MRSA Assay (FDA approved for NASAL specimens only), is one component of a comprehensive MRSA colonization surveillance program. It is not intended to diagnose MRSA infection nor to guide or monitor treatment for MRSA infections. Performed at [facility], [address], [place], [place] [address]      Scheduled Meds: . aspirin  81 mg Oral Daily  . atorvastatin  20 mg Oral q1800  . budesonide (PULMICORT) nebulizer solution  0.5 mg Nebulization BID  . Chlorhexidine Gluconate Cloth  6 each Topical Daily  . clopidogrel  75 mg Oral Daily  . collagenase   Topical Daily  . dextromethorphan-guaiFENesin  1 tablet Oral BID  . doxycycline  100 mg Oral Q12H  . furosemide  20 mg Intravenous Q12H  . heparin injection (subcutaneous)  5,000 Units Subcutaneous Q8H  . insulin aspart  0-15 Units Subcutaneous TID WC  . insulin aspart  0-5 Units Subcutaneous QHS  . ipratropium  0.5 mg Nebulization Q6H  . levalbuterol  1.25 mg Nebulization Q6H  . mouth rinse  15 mL Mouth  Rinse BID  . mouth rinse  15 mL Mouth Rinse BID  . methylPREDNISolone (SOLU-MEDROL) injection  60 mg Intravenous Q12H  . nicotine  14 mg Transdermal Daily  . pantoprazole  40 mg Oral Daily  . sodium chloride flush  3 mL Intravenous Q12H   Continuous Infusions: . sodium chloride    . cefTRIAXone (ROCEPHIN)  IV Stopped (04/21/19 0848)    Procedures/Studies: CT Angio Chest PE W and/or Wo Contrast  Result Date: 04/20/2019 CLINICAL DATA:  Shortness of breath. EXAM: CT ANGIOGRAPHY CHEST WITH CONTRAST TECHNIQUE: Multidetector CT imaging of the chest was performed using the standard protocol during bolus administration of intravenous contrast. Multiplanar CT image reconstructions and MIPs were obtained to evaluate the vascular anatomy. CONTRAST:  120mL OMNIPAQUE IOHEXOL 350 MG/ML SOLN COMPARISON:  None. FINDINGS: Cardiovascular: Satisfactory opacification of the pulmonary arteries to the segmental level. No evidence of pulmonary embolism. Normal heart size. No pericardial effusion. Atherosclerosis of thoracic aorta is noted without aneurysm or dissection. Mediastinum/Nodes: Thyroid gland and esophagus are unremarkable. Enlarged pre-vascular a subcarinal adenopathy is noted which most likely is inflammatory in etiology. Lungs/Pleura: No pneumothorax is noted. Minimal bilateral pleural effusions are noted. Multiple airspace opacities are noted throughout both lungs most consistent with multifocal pneumonia, most likely of viral etiology. Upper Abdomen: No acute abnormality. Musculoskeletal: No chest wall abnormality. No acute or significant osseous findings. Review of the MIP images confirms the above findings. IMPRESSION: 1. No definite evidence of pulmonary embolus. 2. Multiple airspace opacities are noted throughout both lungs most consistent with multifocal pneumonia, most likely of viral etiology. 3. Minimal bilateral pleural effusions are noted. 4. Enlarged mediastinal adenopathy is noted which most  likely is inflammatory in etiology. Aortic Atherosclerosis (ICD10-I70.0). Electronically Signed   By: Marijo Conception M.D.   On: 04/20/2019 10:54   US Venous Img Lower Unilateral Right  Result Date: 04/20/2019 CLINICAL DATA:  Swelling x5 days EXAM: RIGHT LOWER EXTREMITY VENOUS DOPPLER ULTRASOUND TECHNIQUE: Gray-scale sonography with compression, as well as color and duplex ultrasound, were performed to evaluate the deep venous system(s) from the level of the common femoral vein through the popliteal and proximal calf veins. COMPARISON:  None. FINDINGS: VENOUS Normal compressibility of the common femoral, superficial femoral, and popliteal veins, as well as the visualized calf veins. Visualized portions of profunda femoral vein and great saphenous vein unremarkable. No filling defects to suggest DVT on grayscale or color Doppler  imaging. Doppler waveforms show normal direction of venous flow, normal respiratory phasicity and response to augmentation. Limited views of the contralateral common femoral vein are unremarkable. OTHER Subcutaneous calf edema. Limitations: none IMPRESSION: No femoropopliteal DVT nor evidence of DVT within the visualized calf veins. If clinical symptoms are inconsistent or if there are persistent or worsening symptoms, further imaging (possibly involving the iliac veins) may be warranted. Electronically Signed   By: Lucrezia Europe M.D.   On: 04/20/2019 10:33   DG Chest Portable 1 View  Result Date: 04/20/2019 CLINICAL DATA:  Shortness of breath, history coronary artery disease, hypertension, type II diabetes mellitus, COPD, smoker EXAM: PORTABLE CHEST 1 VIEW COMPARISON:  Portable exam 0853 hours compared to 01/28/2019 FINDINGS: Lordotic positioning. Enlargement of cardiac silhouette. Mediastinal contours normal. Patchy BILATERAL pulmonary infiltrates consistent with multifocal pneumonia. No pleural effusion or pneumothorax. No acute osseous findings. IMPRESSION: Patchy BILATERAL pulmonary  infiltrates consistent with multifocal pneumonia; question of COVID-19 pneumonia is raised. Electronically Signed   By: Lavonia Dana M.D.   On: 04/20/2019 09:00   ECHOCARDIOGRAM COMPLETE  Result Date: 04/20/2019    ECHOCARDIOGRAM REPORT   Patient Name:   EMMERICK ANDO Date of Exam: 04/20/2019 Medical Rec #:  [id number]       Height:       67.0 in Accession #:    [id number]      Weight:       198.4 lb Date of Birth:  Nov 11, 1945      BSA:          2.015 m Patient Age:    62 years        BP:           119/65 mmHg Patient Gender: M               HR:           104 bpm. Exam Location:  [facility] Procedure: 2D Echo Indications:    Dyspnea 786.09 / R06.00  History:        Patient has prior history of Echocardiogram examinations, most                 recent 07/03/2016. CAD, COPD; Risk Factors:Diabetes, Dyslipidemia                 and Hypertension. Acute respiratory failure with hypoxia ,                 Peripheral arterial disease.  Sonographer:    Leavy Cella RDCS (AE) Referring Phys: [place]  1. Left ventricular ejection fraction, by estimation, is 50 to 55%. The left ventricle has low normal function. The left ventricle has no regional wall motion abnormalities. Indeterminate diastolic filling due to E-A fusion. Elevated left ventricular end-diastolic pressure.  2. Right ventricular systolic function is mildly reduced. The right ventricular size is normal. There is moderately elevated pulmonary artery systolic pressure.  3. Left atrial size was moderately dilated.  4. The mitral valve is grossly normal. Mild mitral valve regurgitation.  5. The aortic valve is tricuspid. Aortic valve regurgitation is not visualized. No aortic stenosis is present.  6. The inferior vena cava is dilated in size with >50% respiratory variability, suggesting right atrial pressure of 8 mmHg. FINDINGS  Left Ventricle: Left ventricular ejection fraction, by estimation, is 50 to 55%. The left ventricle has low normal  function. The left ventricle has no regional wall motion abnormalities. The left ventricular internal cavity size was  normal in size. There is no left ventricular hypertrophy. Indeterminate diastolic filling due to E-A fusion. Elevated left ventricular end-diastolic pressure. Right Ventricle: The right ventricular size is normal. No increase in right ventricular wall thickness. Right ventricular systolic function is mildly reduced. There is moderately elevated pulmonary artery systolic pressure. The tricuspid regurgitant velocity is 3.30 m/s, and with an assumed right atrial pressure of 10 mmHg, the estimated right ventricular systolic pressure is [id number] mmHg. Left Atrium: Left atrial size was moderately dilated. Right Atrium: Right atrial size was normal in size. Pericardium: There is no evidence of pericardial effusion. Mitral Valve: The mitral valve is grossly normal. Mild mitral valve regurgitation. Tricuspid Valve: The tricuspid valve is grossly normal. Tricuspid valve regurgitation is mild. Aortic Valve: The aortic valve is tricuspid. . There is mild thickening of the aortic valve. Aortic valve regurgitation is not visualized. No aortic stenosis is present. There is mild thickening of the aortic valve. Pulmonic Valve: The pulmonic valve was grossly normal. Pulmonic valve regurgitation is not visualized. Aorta: The aortic root is normal in size and structure. Venous: The inferior vena cava is dilated in size with greater than 50% respiratory variability, suggesting right atrial pressure of 8 mmHg. IAS/Shunts: No atrial level shunt detected by color flow Doppler.  LEFT VENTRICLE PLAX 2D LVIDd:         5.04 cm  Diastology LVIDs:         4.45 cm  LV e' lateral:   8.16 cm/s LV PW:         1.28 cm  LV E/e' lateral: 10.7 LV IVS:        0.86 cm  LV e' medial:    4.03 cm/s LVOT diam:     2.10 cm  LV E/e' medial:  21.7 LVOT Area:     3.46 cm  RIGHT VENTRICLE RV S prime:     6.31 cm/s TAPSE (M-mode): 1.1 cm LEFT ATRIUM              Index       RIGHT ATRIUM           Index LA diam:        5.20 cm 2.58 cm/m  RA Area:     22.20 cm LA Vol (A2C):   68.3 ml 33.89 ml/m RA Volume:   78.40 ml  38.90 ml/m LA Vol (A4C):   80.0 ml 39.69 ml/m LA Biplane Vol: 80.7 ml 40.04 ml/m   AORTA Ao Root diam: 3.20 cm MITRAL VALVE               TRICUSPID VALVE MV Area (PHT): 4.60 cm    TR Peak grad:   43.6 mmHg MV Decel Time: 165 msec    TR Vmax:        330.00 cm/s MR Peak grad: 60.2 mmHg MR Mean grad: 42.0 mmHg    SHUNTS MR Vmax:      388.00 cm/s  Systemic Diam: 2.10 cm MR Vmean:     307.0 cm/s MV E velocity: 87.30 cm/s MV A velocity: 39.80 cm/s MV E/A ratio:  2.19 Kate Sable MD Electronically signed by Kate Sable MD Signature Date/Time: 04/20/2019/3:05:50 PM    Final     Orson Eva, DO  [facility]  If 7PM-7AM, please contact night-coverage [email or web address] Password [email or web address] 04/22/2019, 8:03 AM   LOS: 2 days

## 2019-04-22 NOTE — Progress Notes (Signed)
Inpatient Diabetes Program Recommendations  AACE/ADA: New Consensus Statement on Inpatient Glycemic Control   Target Ranges:  Prepandial:   less than 140 mg/dL      Peak postprandial:   less than 180 mg/dL (1-2 hours)      Critically ill patients:  140 - 180 mg/dL  Results for CORTLEN, DUBS (MRN [id number]) as of 04/22/2019 10:30  Ref. Range 04/21/2019 07:50 04/21/2019 11:17 04/21/2019 16:15 04/21/2019 21:23 04/22/2019 07:46  Glucose-Capillary Latest Ref Range: 70 - 99 mg/dL 210 (H) 213 (H) 183 (H) 188 (H) 208 (H)    Review of Glycemic Control  Diabetes history: DM2 Outpatient Diabetes medications: none Current orders for Inpatient glycemic control: Novolog 0-15 units TID with meals, Novolog 0-5 units QHS; Solumedrol 60 mg Q12H  Inpatient Diabetes Program Recommendations:    Insulin-Meal Coverage: If steroids are continued, please consider ordering Novolog 3 units TID with meals for meal coverage if patient eats at least 50% of meals.  Thanks, Barnie Alderman, RN, MSN, CDE Diabetes Coordinator Inpatient Diabetes Program [phone] (Team Pager from 8am to 5pm)

## 2019-04-23 ENCOUNTER — Emergency Department (HOSPITAL_COMMUNITY): Payer: PPO

## 2019-04-23 ENCOUNTER — Encounter (HOSPITAL_COMMUNITY): Payer: Self-pay

## 2019-04-23 ENCOUNTER — Inpatient Hospital Stay (HOSPITAL_COMMUNITY)
Admission: EM | Admit: 2019-04-23 | Discharge: 2019-04-28 | Disposition: A | Discharge status: Home Health | Payer: PPO | Source: Home / Self Care | Attending: Family Medicine | Admitting: Family Medicine

## 2019-04-23 ENCOUNTER — Other Ambulatory Visit: Payer: Self-pay

## 2019-04-23 DIAGNOSIS — R0602 Shortness of breath: Secondary | ICD-10-CM

## 2019-04-23 DIAGNOSIS — J9601 Acute respiratory failure with hypoxia: Secondary | ICD-10-CM

## 2019-04-23 DIAGNOSIS — J441 Chronic obstructive pulmonary disease with (acute) exacerbation: Secondary | ICD-10-CM | POA: Diagnosis present

## 2019-04-23 DIAGNOSIS — Z720 Tobacco use: Secondary | ICD-10-CM | POA: Diagnosis present

## 2019-04-23 DIAGNOSIS — R0600 Dyspnea, unspecified: Secondary | ICD-10-CM

## 2019-04-23 DIAGNOSIS — I10 Essential (primary) hypertension: Secondary | ICD-10-CM | POA: Diagnosis present

## 2019-04-23 DIAGNOSIS — I4892 Unspecified atrial flutter: Secondary | ICD-10-CM | POA: Diagnosis present

## 2019-04-23 DIAGNOSIS — I2510 Atherosclerotic heart disease of native coronary artery without angina pectoris: Secondary | ICD-10-CM | POA: Diagnosis present

## 2019-04-23 DIAGNOSIS — J449 Chronic obstructive pulmonary disease, unspecified: Secondary | ICD-10-CM | POA: Diagnosis present

## 2019-04-23 DIAGNOSIS — J9621 Acute and chronic respiratory failure with hypoxia: Secondary | ICD-10-CM | POA: Diagnosis present

## 2019-04-23 DIAGNOSIS — R0689 Other abnormalities of breathing: Secondary | ICD-10-CM

## 2019-04-23 DIAGNOSIS — J189 Pneumonia, unspecified organism: Secondary | ICD-10-CM

## 2019-04-23 DIAGNOSIS — R0902 Hypoxemia: Secondary | ICD-10-CM

## 2019-04-23 DIAGNOSIS — E785 Hyperlipidemia, unspecified: Secondary | ICD-10-CM | POA: Diagnosis present

## 2019-04-23 DIAGNOSIS — J690 Pneumonitis due to inhalation of food and vomit: Secondary | ICD-10-CM | POA: Diagnosis present

## 2019-04-23 LAB — COMPREHENSIVE METABOLIC PANEL
ALT: 46 U/L — ABNORMAL HIGH (ref 0–44)
AST: 28 U/L (ref 15–41)
Albumin: 2.9 g/dL — ABNORMAL LOW (ref 3.5–5.0)
Alkaline Phosphatase: 85 U/L (ref 38–126)
Anion gap: 13 (ref 5–15)
BUN: 45 mg/dL — ABNORMAL HIGH (ref 8–23)
CO2: 21 mmol/L — ABNORMAL LOW (ref 22–32)
Calcium: 8.5 mg/dL — ABNORMAL LOW (ref 8.9–10.3)
Chloride: 102 mmol/L (ref 98–111)
Creatinine, Ser: 1.82 mg/dL — ABNORMAL HIGH (ref 0.61–1.24)
GFR calc Af Amer: 41 mL/min — ABNORMAL LOW (ref >60)
GFR calc non Af Amer: 36 mL/min — ABNORMAL LOW (ref >60)
Glucose, Bld: 269 mg/dL — ABNORMAL HIGH (ref 70–99)
Potassium: 4.3 mmol/L (ref 3.5–5.1)
Sodium: 136 mmol/L (ref 135–145)
Total Bilirubin: 0.6 mg/dL (ref 0.3–1.2)
Total Protein: 7.7 g/dL (ref 6.5–8.1)

## 2019-04-23 LAB — CBC WITH DIFFERENTIAL/PLATELET
Abs Immature Granulocytes: 0.21 10*3/uL — ABNORMAL HIGH (ref 0.00–0.07)
Basophils Absolute: 0 10*3/uL (ref 0.0–0.1)
Basophils Relative: 0 %
Eosinophils Absolute: 0 10*3/uL (ref 0.0–0.5)
Eosinophils Relative: 0 %
HCT: 31.6 % — ABNORMAL LOW (ref 39.0–52.0)
Hemoglobin: 9.7 g/dL — ABNORMAL LOW (ref 13.0–17.0)
Immature Granulocytes: 1 %
Lymphocytes Relative: 3 %
Lymphs Abs: 0.5 10*3/uL — ABNORMAL LOW (ref 0.7–4.0)
MCH: 27.1 pg (ref 26.0–34.0)
MCHC: 30.7 g/dL (ref 30.0–36.0)
MCV: 88.3 fL (ref 80.0–100.0)
Monocytes Absolute: 0.8 10*3/uL (ref 0.1–1.0)
Monocytes Relative: 5 %
Neutro Abs: 13.8 10*3/uL — ABNORMAL HIGH (ref 1.7–7.7)
Neutrophils Relative %: 91 %
Platelets: 372 10*3/uL (ref 150–400)
RBC: 3.58 MIL/uL — ABNORMAL LOW (ref 4.22–5.81)
RDW: 15.9 % — ABNORMAL HIGH (ref 11.5–15.5)
WBC: 15.3 10*3/uL — ABNORMAL HIGH (ref 4.0–10.5)
nRBC: 0.4 % — ABNORMAL HIGH (ref 0.0–0.2)

## 2019-04-23 LAB — GLUCOSE, CAPILLARY
Glucose-Capillary: 203 mg/dL — ABNORMAL HIGH (ref 70–99)
Glucose-Capillary: 232 mg/dL — ABNORMAL HIGH (ref 70–99)

## 2019-04-23 LAB — BASIC METABOLIC PANEL
Anion gap: 12 (ref 5–15)
BUN: 41 mg/dL — ABNORMAL HIGH (ref 8–23)
CO2: 21 mmol/L — ABNORMAL LOW (ref 22–32)
Calcium: 8.6 mg/dL — ABNORMAL LOW (ref 8.9–10.3)
Chloride: 103 mmol/L (ref 98–111)
Creatinine, Ser: 1.54 mg/dL — ABNORMAL HIGH (ref 0.61–1.24)
GFR calc Af Amer: 50 mL/min — ABNORMAL LOW (ref >60)
GFR calc non Af Amer: 43 mL/min — ABNORMAL LOW (ref >60)
Glucose, Bld: 224 mg/dL — ABNORMAL HIGH (ref 70–99)
Potassium: 4.1 mmol/L (ref 3.5–5.1)
Sodium: 136 mmol/L (ref 135–145)

## 2019-04-23 LAB — TROPONIN I (HIGH SENSITIVITY): Troponin I (High Sensitivity): 335 ng/L (ref <18)

## 2019-04-23 LAB — BRAIN NATRIURETIC PEPTIDE: B Natriuretic Peptide: 793 pg/mL — ABNORMAL HIGH (ref 0.0–100.0)

## 2019-04-23 LAB — MAGNESIUM: Magnesium: 2.2 mg/dL (ref 1.7–2.4)

## 2019-04-23 IMAGING — DX DG CHEST 1V PORT
1 series · 1 of 1 positions shown · non-contrast
Comparison: [DATE] at [DATE] a.m.

CLINICAL DATA: Short of breath for 1 week, tobacco abuse

EXAM:
PORTABLE CHEST 1 VIEW

[chest ap]
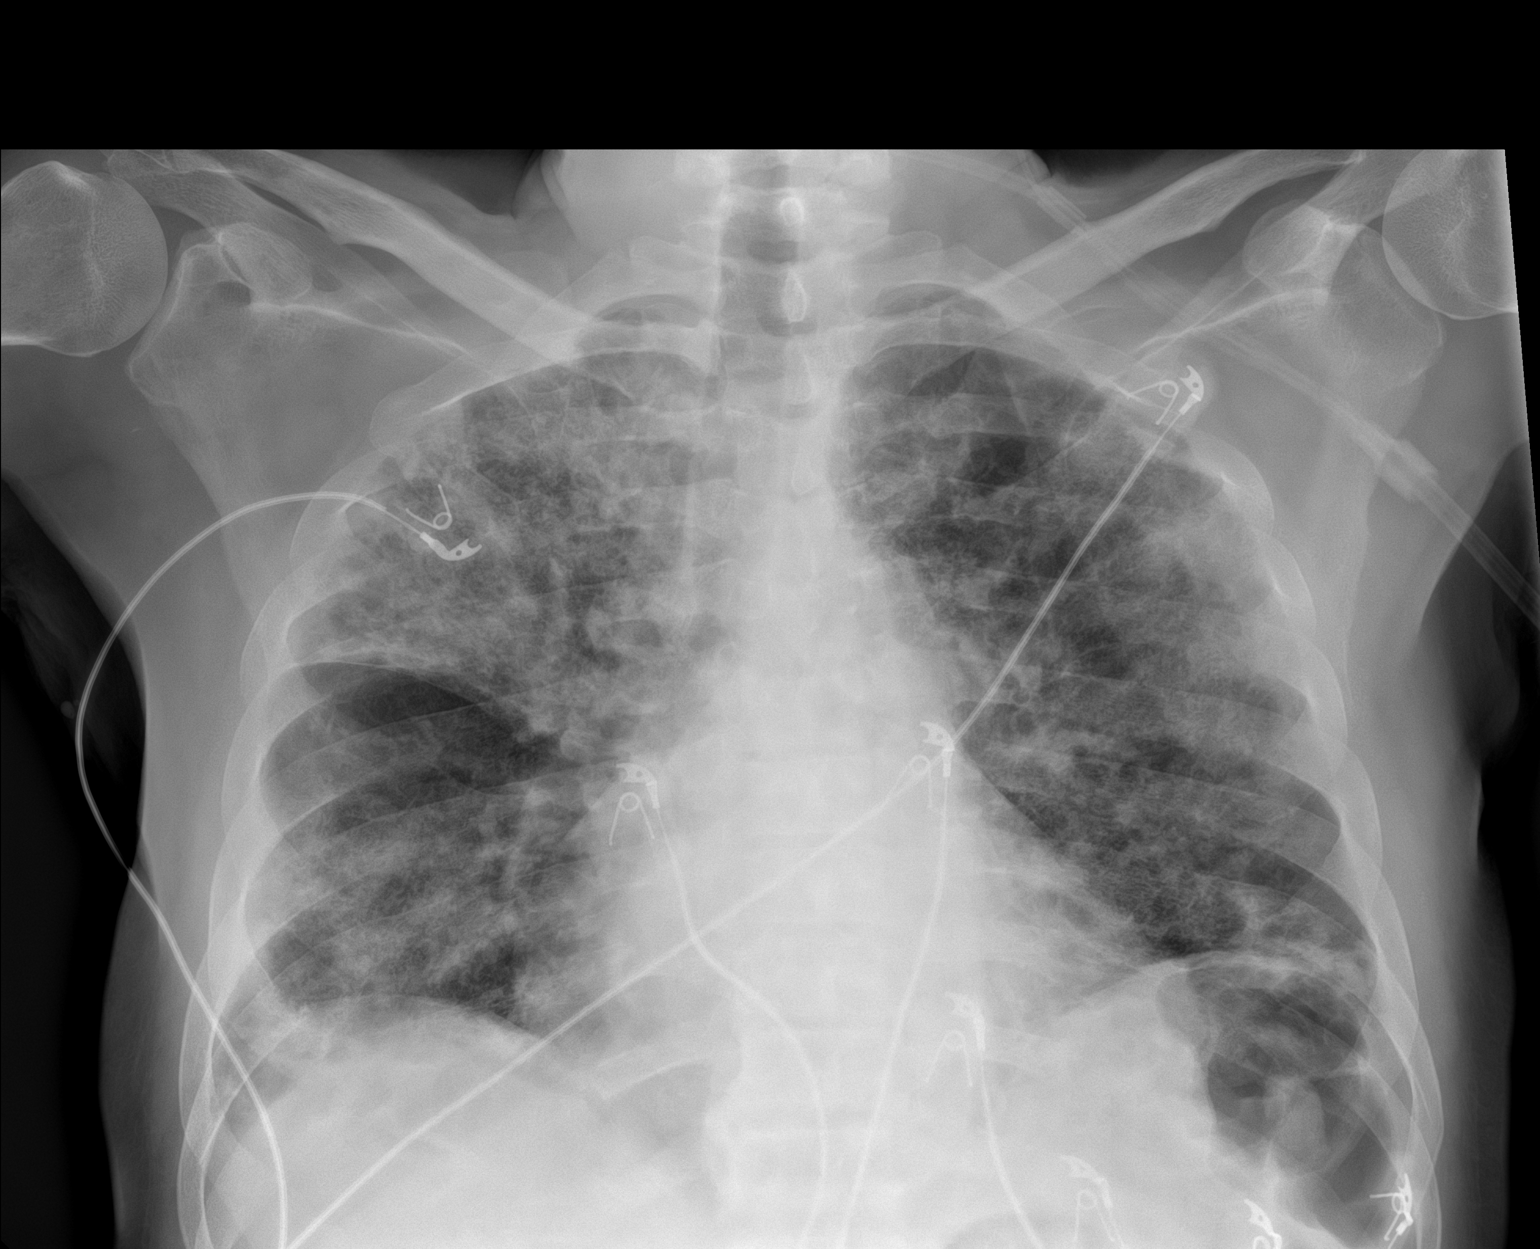

[1 of 1 positions shown; findings below may reference images not displayed]

FINDINGS: Single frontal view of the chest demonstrates a stable cardiac
silhouette. Persistent multifocal bilateral ground-glass airspace
disease unchanged. No effusion or pneumothorax. No acute bony
abnormality.
IMPRESSION: 1. Stable multifocal bilateral ground-glass airspace disease most
consistent with atypical viral pneumonia such as [HF].

## 2019-04-23 MED ORDER — METHYLPREDNISOLONE SODIUM SUCC 40 MG IJ SOLR
40.0000 mg | Freq: Two times a day (BID) | INTRAMUSCULAR | Status: DC
  Administered 2019-04-24 – 2019-04-28 (×10): 40 mg via INTRAVENOUS
  Filled 2019-04-23 (×11): qty 1

## 2019-04-23 MED ORDER — PREDNISONE 50 MG PO TABS: 50.0000 mg | ORAL_TABLET | Freq: Every day | ORAL | 0 refills | Status: DC

## 2019-04-23 MED ORDER — IPRATROPIUM-ALBUTEROL 0.5-2.5 (3) MG/3ML IN SOLN: 3.0000 mL | RESPIRATORY_TRACT | Status: DC | PRN

## 2019-04-23 MED ORDER — ASPIRIN 81 MG PO CHEW
81.0000 mg | CHEWABLE_TABLET | Freq: Every day | ORAL | Status: DC
  Administered 2019-04-24 – 2019-04-28 (×5): 81 mg via ORAL
  Filled 2019-04-23 (×5): qty 1

## 2019-04-23 MED ORDER — SODIUM CHLORIDE 0.9% FLUSH
3.0000 mL | Freq: Two times a day (BID) | INTRAVENOUS | Status: DC
  Administered 2019-04-24 – 2019-04-28 (×10): 3 mL via INTRAVENOUS

## 2019-04-23 MED ORDER — DIAZEPAM 2 MG PO TABS
2.0000 mg | ORAL_TABLET | Freq: Two times a day (BID) | ORAL | Status: DC | PRN
  Administered 2019-04-25: 2 mg via ORAL
  Filled 2019-04-23: qty 1

## 2019-04-23 MED ORDER — BISACODYL 10 MG RE SUPP: 10.0000 mg | Freq: Every day | RECTAL | Status: DC | PRN

## 2019-04-23 MED ORDER — FUROSEMIDE 20 MG PO TABS: 20.0000 mg | ORAL_TABLET | Freq: Every day | ORAL | 1 refills | Status: DC

## 2019-04-23 MED ORDER — CLOPIDOGREL BISULFATE 75 MG PO TABS
75.0000 mg | ORAL_TABLET | Freq: Every day | ORAL | Status: DC
  Administered 2019-04-24 – 2019-04-28 (×5): 75 mg via ORAL
  Filled 2019-04-23 (×5): qty 1

## 2019-04-23 MED ORDER — COLCHICINE 0.6 MG PO TABS
0.6000 mg | ORAL_TABLET | Freq: Every day | ORAL | Status: DC
  Administered 2019-04-24 – 2019-04-28 (×5): 0.6 mg via ORAL
  Filled 2019-04-23 (×5): qty 1

## 2019-04-23 MED ORDER — SODIUM CHLORIDE 0.9 % IV SOLN
1.0000 g | INTRAVENOUS | Status: DC
  Administered 2019-04-24 – 2019-04-27 (×4): 1 g via INTRAVENOUS
  Filled 2019-04-23 (×4): qty 10

## 2019-04-23 MED ORDER — TRAMADOL HCL 50 MG PO TABS: 50.0000 mg | ORAL_TABLET | Freq: Three times a day (TID) | ORAL | Status: DC | PRN

## 2019-04-23 MED ORDER — NITROGLYCERIN 0.4 MG SL SUBL: 0.4000 mg | SUBLINGUAL_TABLET | SUBLINGUAL | Status: DC | PRN

## 2019-04-23 MED ORDER — SODIUM CHLORIDE 0.9 % IV SOLN
500.0000 mg | INTRAVENOUS | Status: DC
  Administered 2019-04-24 – 2019-04-27 (×4): 500 mg via INTRAVENOUS
  Filled 2019-04-23 (×4): qty 500

## 2019-04-23 MED ORDER — ATORVASTATIN CALCIUM 10 MG PO TABS
20.0000 mg | ORAL_TABLET | Freq: Every day | ORAL | Status: DC
  Administered 2019-04-24 – 2019-04-28 (×5): 20 mg via ORAL
  Filled 2019-04-23 (×5): qty 2

## 2019-04-23 MED ORDER — SODIUM CHLORIDE 0.9 % IV SOLN: 250.0000 mL | INTRAVENOUS | Status: DC | PRN

## 2019-04-23 MED ORDER — HEPARIN SODIUM (PORCINE) 5000 UNIT/ML IJ SOLN
5000.0000 [IU] | Freq: Three times a day (TID) | INTRAMUSCULAR | Status: DC
  Administered 2019-04-24 – 2019-04-28 (×13): 5000 [IU] via SUBCUTANEOUS
  Filled 2019-04-23 (×13): qty 1

## 2019-04-23 MED ORDER — ACETAMINOPHEN 325 MG PO TABS: 650.0000 mg | ORAL_TABLET | Freq: Four times a day (QID) | ORAL | Status: DC | PRN

## 2019-04-23 MED ORDER — AZITHROMYCIN 500 MG PO TABS: 500.0000 mg | ORAL_TABLET | Freq: Every day | ORAL | 0 refills | Status: DC

## 2019-04-23 MED ORDER — FERROUS SULFATE 325 (65 FE) MG PO TABS
325.0000 mg | ORAL_TABLET | Freq: Three times a day (TID) | ORAL | Status: DC
  Administered 2019-04-24 – 2019-04-28 (×12): 325 mg via ORAL
  Filled 2019-04-23 (×11): qty 1

## 2019-04-23 MED ORDER — CEFDINIR 300 MG PO CAPS: 300.0000 mg | ORAL_CAPSULE | Freq: Two times a day (BID) | ORAL | 0 refills | Status: DC

## 2019-04-23 MED ORDER — SODIUM CHLORIDE 0.9% FLUSH: 3.0000 mL | INTRAVENOUS | Status: DC | PRN

## 2019-04-23 MED ORDER — ONDANSETRON HCL 4 MG PO TABS: 4.0000 mg | ORAL_TABLET | Freq: Four times a day (QID) | ORAL | Status: DC | PRN

## 2019-04-23 MED ORDER — FUROSEMIDE 10 MG/ML IJ SOLN
20.0000 mg | Freq: Two times a day (BID) | INTRAMUSCULAR | Status: DC
  Administered 2019-04-24 – 2019-04-25 (×4): 20 mg via INTRAVENOUS
  Filled 2019-04-23 (×4): qty 2

## 2019-04-23 MED ORDER — ACETAMINOPHEN 650 MG RE SUPP: 650.0000 mg | Freq: Four times a day (QID) | RECTAL | Status: DC | PRN

## 2019-04-23 MED ORDER — POLYETHYLENE GLYCOL 3350 17 G PO PACK: 17.0000 g | PACK | Freq: Every day | ORAL | Status: DC | PRN

## 2019-04-23 MED ORDER — POTASSIUM CHLORIDE CRYS ER 20 MEQ PO TBCR
20.0000 meq | EXTENDED_RELEASE_TABLET | Freq: Every day | ORAL | Status: DC
  Administered 2019-04-24 – 2019-04-28 (×5): 20 meq via ORAL
  Filled 2019-04-23 (×5): qty 1

## 2019-04-23 MED ORDER — ONDANSETRON HCL 4 MG/2ML IJ SOLN: 4.0000 mg | Freq: Four times a day (QID) | INTRAMUSCULAR | Status: DC | PRN

## 2019-04-23 NOTE — ED Notes (Signed)
Date and time results received: 04/23/19 2127   Test: Troponin Critical Value: 335  Name of Provider Notified: Rogene Houston, MD

## 2019-04-23 NOTE — Discharge Summary (Signed)
Physician Discharge Summary  Azaiah Kellison Deridder [id number] DOB: [birth year] DOA: 04/20/2019  PCP: Redmond School, MD  Admit date: 04/20/2019 Discharge date: 04/23/2019  Admitted From: Home Disposition:  AGAINST MEDICAL ADVICE  Recommendations for Outpatient Follow-up:  1. Follow up with PCP in 1-2 weeks 2. Please obtain BMP/CBC in one week 3. Please follow up on the following pending results:  Home Health: YES Equipment/Devices: 3.5L  Discharge Condition:  [place]    Brief/Interim Summary: 74 y.o.malewith a past medical history significant for tobacco abuse, hypertension, hyperlipidemia, history of coronary disease/myocardial infarction in the past, type 2 diabetes mellitus and gastroesophageal reflux disease; who presented to the hospital secondary to approximately 7-8 days of worsening shortness of breath. Patient has been seen at the office of his PCP where he received 2 shots for what appears to be COPD exacerbation without significant improvement and continues worsening in his breathing status. Patient reports some lower extremity edema, orthopnea and short of breath on exertion. He expressed increased wheezing and no response to his home bronchodilator inhaler. Patient denies any fever, chills, rhinorrhea, chest pain, abdominal pain, dysuria, hematuria, melena, hematochezia, headaches, focal weakness or any other complaints. In the ED he was found to be hypoxic in the high 80s on room air and very tachypneic with diffuse wheezing.Covid test was negative.  Chest x-ray demonstrated bilateral patchy infiltrates and congestion;troponin significantly elevated in the 1000 range and also elevated BNP. He was not febrile and with normal WBCs. Renal function within normal limits. Patient has a lactic acid of 2.7. Cultures retrieved, cardiology service consulted, IV antibiotics started and one-time dose of Lasix given. Patient  also received albuterol nebulization and steroids. [facility] contacted to admit patient for further evaluation and management.  In the morning of 04/23/19, the patient was adamant about leaving AMA. In speaking with Hollice Espy, [place] has demonstrated the ability to understand his medical condition(s) which include CHF, pneumonia, COPD exacerbation.  Erasto Kuehler Sison has demonstrated the ability to appreciate how treatment for  CHF, pneumonia, COPD exacerbation will be beneficial.   Antwan Tritten Spofford has also demonstrated the ability to understand and appreciate how refusal of treatment for CHF, pneumonia, COPD exacerbation  could result in harm, repeat hospitalization, and possibly death.  Coleby Borghese Zeck demonstrates the ability to reason through the risks and benefits of the proposed treatment.  Finally, Mustaf Foutch Wixon is able to clearly communicate his/her choice.   Discharge Diagnoses:  acute respiratory failure with hypoxia -secondary to CHF, PNA, COPD exacerbation -continue steroids, IV diuresis, pulmicort, bronchodilators andflutter valve -will continue IV abx's and follow clinical improvement -wean O2 down as tolerated -04/20/2019 CTA chest--negative PE, multiple airspace opacities bilateral, minimal bilateral pleural effusion, enlarged mediastinal lymph nodes -despite leaving AMA, home oxygen was set up for the patient-->3.5L  Acute on chronic diastolic CHF -Continue IV furosemide 20 mg IV twice daily -Remains clinically fluid overloaded -04/20/2019 echo EF 50-55%, indeterminate diastolic function, mild decreased RV function -Follow daily weights, strict I's and O's and low-sodium diet  Lobar pneumonia -Check procalcitonin--0.10 -Continue ceftriaxone -Restart azithromycin  COPD exacerbation -Continue Pulmicort -Continue Xopenex and Atrovent -Added Brovana -continue Iv solumedrol  type 2 diabetes with hyperglycemia -A1c 7.6 -Holding oral hypoglycemic agents while  inpatient -Continue the use of sliding scale insulin and follow CBG fluctuation.  nonsustained atrial flutter with RVR -converted back to sinusafter 1 dose of metoprolol -likely due to acute medical illness -Per cardiology recommendations no need  for anticoagulation at this time  -received heparin drip x 24-48 hours. -Continue monitoring on telemetry-->remained in sinus -albuterol discontinued and patient started on xopenex.   coronary artery disease/Elevated troponin -elevated troponin in the setting of demand ischemia -Personally reviewed EKG--sinus rhythm, ST depression V2-V5 -2D echo reassuring with no wall motion abnormalities and preserved ejection fraction. -Continue the use of aspirin, statins and Plavix. -Follow-up with cardiology service with intention to pursuitnuclear stress test.  tobacco abuse -Cessation counseling has been provided-patient reports completely quitting smoking approximately 3-4 weeks ago -Encouraged to keep himself smoking free.  gastroesophageal flux disease -Continue PPI.  lactic acidosis -peaked at 2.7 -most likely from hypoxia  -Remains afebrile hemodynamically stable   Right Leg Edema -venous duplex--neg DVT     Discharge Instructions   Allergies as of 04/23/2019   No Known Allergies     Medication List    STOP taking these medications   amLODipine 10 MG tablet Commonly known as: NORVASC   indomethacin 25 MG capsule Commonly known as: INDOCIN     TAKE these medications   albuterol 108 (90 Base) MCG/ACT inhaler Commonly known as: VENTOLIN HFA Inhale 2 puffs into the lungs every 4 (four) hours as needed for wheezing or shortness of breath.   aspirin 81 MG chewable tablet Chew 81 mg by mouth daily.   atorvastatin 20 MG tablet Commonly known as: LIPITOR Take 20 mg by mouth daily.   azithromycin 500 MG tablet Commonly known as: ZITHROMAX Take 1 tablet (500 mg total) by mouth daily.   cefdinir 300 MG  capsule Commonly known as: OMNICEF Take 1 capsule (300 mg total) by mouth 2 (two) times daily.   clopidogrel 75 MG tablet Commonly known as: PLAVIX Take 75 mg by mouth daily.   colchicine 0.6 MG tablet Take 0.6 mg by mouth daily.   diazepam 2 MG tablet Commonly known as: VALIUM Take 2 mg by mouth every 12 (twelve) hours as needed for anxiety.   ferrous sulfate 325 (65 FE) MG tablet Take 325 mg by mouth 3 (three) times daily.   furosemide 20 MG tablet Commonly known as: Lasix Take 1 tablet (20 mg total) by mouth daily.   Klor-Con M20 20 MEQ tablet Generic drug: potassium chloride SA TAKE 1 TABLET BY MOUTH EVERY DAY What changed: how much to take   nitroGLYCERIN 0.4 MG SL tablet Commonly known as: NITROSTAT Place 1 tablet (0.4 mg total) under the tongue every 5 (five) minutes as needed for chest pain. Take   predniSONE 50 MG tablet Commonly known as: DELTASONE Take 1 tablet (50 mg total) by mouth daily with breakfast.            Durable Medical Equipment  (From admission, onward)         Start     Ordered   04/23/19 1210  For home use only DME oxygen  Once    Question Answer Comment  Length of Need Lifetime   Mode or (Route) Nasal cannula   Liters per Minute 3.5   Frequency Continuous (stationary and portable oxygen unit needed)   Oxygen conserving device Yes   Oxygen delivery system Gas      04/23/19 1209         Follow-up Information    Lorretta Harp, MD Follow up on 05/06/2019.   Specialties: Cardiology, Radiology Why: Cardiology Follow-up on 05/06/2019 at 9:45 AM.  Contact information: [address] [place] [address] [phone]  No Known Allergies  Consultations:  cardiology   Procedures/Studies: CT Angio Chest PE W and/or Wo Contrast  Result Date: 04/20/2019 CLINICAL DATA:  Shortness of breath. EXAM: CT ANGIOGRAPHY CHEST WITH CONTRAST TECHNIQUE: Multidetector CT imaging of the chest was performed  using the standard protocol during bolus administration of intravenous contrast. Multiplanar CT image reconstructions and MIPs were obtained to evaluate the vascular anatomy. CONTRAST:  150mL OMNIPAQUE IOHEXOL 350 MG/ML SOLN COMPARISON:  None. FINDINGS: Cardiovascular: Satisfactory opacification of the pulmonary arteries to the segmental level. No evidence of pulmonary embolism. Normal heart size. No pericardial effusion. Atherosclerosis of thoracic aorta is noted without aneurysm or dissection. Mediastinum/Nodes: Thyroid gland and esophagus are unremarkable. Enlarged pre-vascular a subcarinal adenopathy is noted which most likely is inflammatory in etiology. Lungs/Pleura: No pneumothorax is noted. Minimal bilateral pleural effusions are noted. Multiple airspace opacities are noted throughout both lungs most consistent with multifocal pneumonia, most likely of viral etiology. Upper Abdomen: No acute abnormality. Musculoskeletal: No chest wall abnormality. No acute or significant osseous findings. Review of the MIP images confirms the above findings. IMPRESSION: 1. No definite evidence of pulmonary embolus. 2. Multiple airspace opacities are noted throughout both lungs most consistent with multifocal pneumonia, most likely of viral etiology. 3. Minimal bilateral pleural effusions are noted. 4. Enlarged mediastinal adenopathy is noted which most likely is inflammatory in etiology. Aortic Atherosclerosis (ICD10-I70.0). Electronically Signed   By: Marijo Conception M.D.   On: 04/20/2019 10:54   US Venous Img Lower Unilateral Right  Result Date: 04/20/2019 CLINICAL DATA:  Swelling x5 days EXAM: RIGHT LOWER EXTREMITY VENOUS DOPPLER ULTRASOUND TECHNIQUE: Gray-scale sonography with compression, as well as color and duplex ultrasound, were performed to evaluate the deep venous system(s) from the level of the common femoral vein through the popliteal and proximal calf veins. COMPARISON:  None. FINDINGS: VENOUS Normal  compressibility of the common femoral, superficial femoral, and popliteal veins, as well as the visualized calf veins. Visualized portions of profunda femoral vein and great saphenous vein unremarkable. No filling defects to suggest DVT on grayscale or color Doppler imaging. Doppler waveforms show normal direction of venous flow, normal respiratory phasicity and response to augmentation. Limited views of the contralateral common femoral vein are unremarkable. OTHER Subcutaneous calf edema. Limitations: none IMPRESSION: No femoropopliteal DVT nor evidence of DVT within the visualized calf veins. If clinical symptoms are inconsistent or if there are persistent or worsening symptoms, further imaging (possibly involving the iliac veins) may be warranted. Electronically Signed   By: Lucrezia Europe M.D.   On: 04/20/2019 10:33   DG Chest Portable 1 View  Result Date: 04/20/2019 CLINICAL DATA:  Shortness of breath, history coronary artery disease, hypertension, type II diabetes mellitus, COPD, smoker EXAM: PORTABLE CHEST 1 VIEW COMPARISON:  Portable exam 0853 hours compared to 01/28/2019 FINDINGS: Lordotic positioning. Enlargement of cardiac silhouette. Mediastinal contours normal. Patchy BILATERAL pulmonary infiltrates consistent with multifocal pneumonia. No pleural effusion or pneumothorax. No acute osseous findings. IMPRESSION: Patchy BILATERAL pulmonary infiltrates consistent with multifocal pneumonia; question of COVID-19 pneumonia is raised. Electronically Signed   By: Lavonia Dana M.D.   On: 04/20/2019 09:00   ECHOCARDIOGRAM COMPLETE  Result Date: 04/20/2019    ECHOCARDIOGRAM REPORT   Patient Name:   MUKUL KRAHMER Date of Exam: 04/20/2019 Medical Rec #:  [id number]       Height:       67.0 in Accession #:    [id number]      Weight:  198.4 lb Date of Birth:  [birth year]      BSA:          2.015 m Patient Age:    11 years        BP:           119/65 mmHg Patient Gender: M               HR:           104 bpm.  Exam Location:  [facility] Procedure: 2D Echo Indications:    Dyspnea 786.09 / R06.00  History:        Patient has prior history of Echocardiogram examinations, most                 recent 07/03/2016. CAD, COPD; Risk Factors:Diabetes, Dyslipidemia                 and Hypertension. Acute respiratory failure with hypoxia ,                 Peripheral arterial disease.  Sonographer:    Leavy Cella RDCS (AE) Referring Phys: [place]  1. Left ventricular ejection fraction, by estimation, is 50 to 55%. The left ventricle has low normal function. The left ventricle has no regional wall motion abnormalities. Indeterminate diastolic filling due to E-A fusion. Elevated left ventricular end-diastolic pressure.  2. Right ventricular systolic function is mildly reduced. The right ventricular size is normal. There is moderately elevated pulmonary artery systolic pressure.  3. Left atrial size was moderately dilated.  4. The mitral valve is grossly normal. Mild mitral valve regurgitation.  5. The aortic valve is tricuspid. Aortic valve regurgitation is not visualized. No aortic stenosis is present.  6. The inferior vena cava is dilated in size with >50% respiratory variability, suggesting right atrial pressure of 8 mmHg. FINDINGS  Left Ventricle: Left ventricular ejection fraction, by estimation, is 50 to 55%. The left ventricle has low normal function. The left ventricle has no regional wall motion abnormalities. The left ventricular internal cavity size was normal in size. There is no left ventricular hypertrophy. Indeterminate diastolic filling due to E-A fusion. Elevated left ventricular end-diastolic pressure. Right Ventricle: The right ventricular size is normal. No increase in right ventricular wall thickness. Right ventricular systolic function is mildly reduced. There is moderately elevated pulmonary artery systolic pressure. The tricuspid regurgitant velocity is 3.30 m/s, and with an assumed right  atrial pressure of 10 mmHg, the estimated right ventricular systolic pressure is [id number] mmHg. Left Atrium: Left atrial size was moderately dilated. Right Atrium: Right atrial size was normal in size. Pericardium: There is no evidence of pericardial effusion. Mitral Valve: The mitral valve is grossly normal. Mild mitral valve regurgitation. Tricuspid Valve: The tricuspid valve is grossly normal. Tricuspid valve regurgitation is mild. Aortic Valve: The aortic valve is tricuspid. . There is mild thickening of the aortic valve. Aortic valve regurgitation is not visualized. No aortic stenosis is present. There is mild thickening of the aortic valve. Pulmonic Valve: The pulmonic valve was grossly normal. Pulmonic valve regurgitation is not visualized. Aorta: The aortic root is normal in size and structure. Venous: The inferior vena cava is dilated in size with greater than 50% respiratory variability, suggesting right atrial pressure of 8 mmHg. IAS/Shunts: No atrial level shunt detected by color flow Doppler.  LEFT VENTRICLE PLAX 2D LVIDd:         5.04 cm  Diastology LVIDs:  4.45 cm  LV e' lateral:   8.16 cm/s LV PW:         1.28 cm  LV E/e' lateral: 10.7 LV IVS:        0.86 cm  LV e' medial:    4.03 cm/s LVOT diam:     2.10 cm  LV E/e' medial:  21.7 LVOT Area:     3.46 cm  RIGHT VENTRICLE RV S prime:     6.31 cm/s TAPSE (M-mode): 1.1 cm LEFT ATRIUM             Index       RIGHT ATRIUM           Index LA diam:        5.20 cm 2.58 cm/m  RA Area:     22.20 cm LA Vol (A2C):   68.3 ml 33.89 ml/m RA Volume:   78.40 ml  38.90 ml/m LA Vol (A4C):   80.0 ml 39.69 ml/m LA Biplane Vol: 80.7 ml 40.04 ml/m   AORTA Ao Root diam: 3.20 cm MITRAL VALVE               TRICUSPID VALVE MV Area (PHT): 4.60 cm    TR Peak grad:   43.6 mmHg MV Decel Time: 165 msec    TR Vmax:        330.00 cm/s MR Peak grad: 60.2 mmHg MR Mean grad: 42.0 mmHg    SHUNTS MR Vmax:      388.00 cm/s  Systemic Diam: 2.10 cm MR Vmean:     307.0 cm/s MV E  velocity: 87.30 cm/s MV A velocity: 39.80 cm/s MV E/A ratio:  2.19 Kate Sable MD Electronically signed by Kate Sable MD Signature Date/Time: 04/20/2019/3:05:50 PM    Final         Discharge Exam: Vitals:   04/23/19 1000 04/23/19 1057  BP: (!) 112/95   Pulse: 95 93  Resp: (!) 26 (!) 29  Temp:    SpO2: (!) 88% 94%   Vitals:   04/23/19 0800 04/23/19 0900 04/23/19 1000 04/23/19 1057  BP: 128/76 126/62 (!) 112/95   Pulse: 95 (!) 104 95 93  Resp: (!) 24 (!) 28 (!) 26 (!) 29  Temp:      TempSrc:      SpO2: 94%  (!) 88% 94%  Weight:      Height:        General: Pt is alert, awake, not in acute distress Cardiovascular: RRR, S1/S2 +, no rubs, no gallops Respiratory: diminished BS.  Bibasilar crackles Abdominal: Soft, NT, ND, bowel sounds + Extremities: RLE edema, no cyanosis   The results of significant diagnostics from this hospitalization (including imaging, microbiology, ancillary and laboratory) are listed below for reference.    Significant Diagnostic Studies: CT Angio Chest PE W and/or Wo Contrast  Result Date: 04/20/2019 CLINICAL DATA:  Shortness of breath. EXAM: CT ANGIOGRAPHY CHEST WITH CONTRAST TECHNIQUE: Multidetector CT imaging of the chest was performed using the standard protocol during bolus administration of intravenous contrast. Multiplanar CT image reconstructions and MIPs were obtained to evaluate the vascular anatomy. CONTRAST:  148mL OMNIPAQUE IOHEXOL 350 MG/ML SOLN COMPARISON:  None. FINDINGS: Cardiovascular: Satisfactory opacification of the pulmonary arteries to the segmental level. No evidence of pulmonary embolism. Normal heart size. No pericardial effusion. Atherosclerosis of thoracic aorta is noted without aneurysm or dissection. Mediastinum/Nodes: Thyroid gland and esophagus are unremarkable. Enlarged pre-vascular a subcarinal adenopathy is noted which most likely is inflammatory in etiology. Lungs/Pleura:  No pneumothorax is noted. Minimal  bilateral pleural effusions are noted. Multiple airspace opacities are noted throughout both lungs most consistent with multifocal pneumonia, most likely of viral etiology. Upper Abdomen: No acute abnormality. Musculoskeletal: No chest wall abnormality. No acute or significant osseous findings. Review of the MIP images confirms the above findings. IMPRESSION: 1. No definite evidence of pulmonary embolus. 2. Multiple airspace opacities are noted throughout both lungs most consistent with multifocal pneumonia, most likely of viral etiology. 3. Minimal bilateral pleural effusions are noted. 4. Enlarged mediastinal adenopathy is noted which most likely is inflammatory in etiology. Aortic Atherosclerosis (ICD10-I70.0). Electronically Signed   By: Marijo Conception M.D.   On: 04/20/2019 10:54   US Venous Img Lower Unilateral Right  Result Date: 04/20/2019 CLINICAL DATA:  Swelling x5 days EXAM: RIGHT LOWER EXTREMITY VENOUS DOPPLER ULTRASOUND TECHNIQUE: Gray-scale sonography with compression, as well as color and duplex ultrasound, were performed to evaluate the deep venous system(s) from the level of the common femoral vein through the popliteal and proximal calf veins. COMPARISON:  None. FINDINGS: VENOUS Normal compressibility of the common femoral, superficial femoral, and popliteal veins, as well as the visualized calf veins. Visualized portions of profunda femoral vein and great saphenous vein unremarkable. No filling defects to suggest DVT on grayscale or color Doppler imaging. Doppler waveforms show normal direction of venous flow, normal respiratory phasicity and response to augmentation. Limited views of the contralateral common femoral vein are unremarkable. OTHER Subcutaneous calf edema. Limitations: none IMPRESSION: No femoropopliteal DVT nor evidence of DVT within the visualized calf veins. If clinical symptoms are inconsistent or if there are persistent or worsening symptoms, further imaging (possibly  involving the iliac veins) may be warranted. Electronically Signed   By: Lucrezia Europe M.D.   On: 04/20/2019 10:33   DG Chest Portable 1 View  Result Date: 04/20/2019 CLINICAL DATA:  Shortness of breath, history coronary artery disease, hypertension, type II diabetes mellitus, COPD, smoker EXAM: PORTABLE CHEST 1 VIEW COMPARISON:  Portable exam 0853 hours compared to 01/28/2019 FINDINGS: Lordotic positioning. Enlargement of cardiac silhouette. Mediastinal contours normal. Patchy BILATERAL pulmonary infiltrates consistent with multifocal pneumonia. No pleural effusion or pneumothorax. No acute osseous findings. IMPRESSION: Patchy BILATERAL pulmonary infiltrates consistent with multifocal pneumonia; question of COVID-19 pneumonia is raised. Electronically Signed   By: Lavonia Dana M.D.   On: 04/20/2019 09:00   ECHOCARDIOGRAM COMPLETE  Result Date: 04/20/2019    ECHOCARDIOGRAM REPORT   Patient Name:   FISHER MEHR Date of Exam: 04/20/2019 Medical Rec #:  [id number]       Height:       67.0 in Accession #:    [id number]      Weight:       198.4 lb Date of Birth:  1945-09-15      BSA:          2.015 m Patient Age:    26 years        BP:           119/65 mmHg Patient Gender: M               HR:           104 bpm. [facility]am Location:  [facility] Procedure: 2D Echo Indications:    Dyspnea 786.09 / R06.00  History:        Patient has prior history of Echocardiogram examinations, most                 recent  07/03/2016. CAD, COPD; Risk Factors:Diabetes, Dyslipidemia                 and Hypertension. Acute respiratory failure with hypoxia ,                 Peripheral arterial disease.  Sonographer:    Leavy Cella RDCS (AE) Referring Phys: [place]  1. Left ventricular ejection fraction, by estimation, is 50 to 55%. The left ventricle has low normal function. The left ventricle has no regional wall motion abnormalities. Indeterminate diastolic filling due to E-A fusion. Elevated left ventricular  end-diastolic pressure.  2. Right ventricular systolic function is mildly reduced. The right ventricular size is normal. There is moderately elevated pulmonary artery systolic pressure.  3. Left atrial size was moderately dilated.  4. The mitral valve is grossly normal. Mild mitral valve regurgitation.  5. The aortic valve is tricuspid. Aortic valve regurgitation is not visualized. No aortic stenosis is present.  6. The inferior vena cava is dilated in size with >50% respiratory variability, suggesting right atrial pressure of 8 mmHg. FINDINGS  Left Ventricle: Left ventricular ejection fraction, by estimation, is 50 to 55%. The left ventricle has low normal function. The left ventricle has no regional wall motion abnormalities. The left ventricular internal cavity size was normal in size. There is no left ventricular hypertrophy. Indeterminate diastolic filling due to E-A fusion. Elevated left ventricular end-diastolic pressure. Right Ventricle: The right ventricular size is normal. No increase in right ventricular wall thickness. Right ventricular systolic function is mildly reduced. There is moderately elevated pulmonary artery systolic pressure. The tricuspid regurgitant velocity is 3.30 m/s, and with an assumed right atrial pressure of 10 mmHg, the estimated right ventricular systolic pressure is [id number] mmHg. Left Atrium: Left atrial size was moderately dilated. Right Atrium: Right atrial size was normal in size. Pericardium: There is no evidence of pericardial effusion. Mitral Valve: The mitral valve is grossly normal. Mild mitral valve regurgitation. Tricuspid Valve: The tricuspid valve is grossly normal. Tricuspid valve regurgitation is mild. Aortic Valve: The aortic valve is tricuspid. . There is mild thickening of the aortic valve. Aortic valve regurgitation is not visualized. No aortic stenosis is present. There is mild thickening of the aortic valve. Pulmonic Valve: The pulmonic valve was grossly normal.  Pulmonic valve regurgitation is not visualized. Aorta: The aortic root is normal in size and structure. Venous: The inferior vena cava is dilated in size with greater than 50% respiratory variability, suggesting right atrial pressure of 8 mmHg. IAS/Shunts: No atrial level shunt detected by color flow Doppler.  LEFT VENTRICLE PLAX 2D LVIDd:         5.04 cm  Diastology LVIDs:         4.45 cm  LV e' lateral:   8.16 cm/s LV PW:         1.28 cm  LV E/e' lateral: 10.7 LV IVS:        0.86 cm  LV e' medial:    4.03 cm/s LVOT diam:     2.10 cm  LV E/e' medial:  21.7 LVOT Area:     3.46 cm  RIGHT VENTRICLE RV S prime:     6.31 cm/s TAPSE (M-mode): 1.1 cm LEFT ATRIUM             Index       RIGHT ATRIUM           Index LA diam:        5.20 cm  2.58 cm/m  RA Area:     22.20 cm LA Vol (A2C):   68.3 ml 33.89 ml/m RA Volume:   78.40 ml  38.90 ml/m LA Vol (A4C):   80.0 ml 39.69 ml/m LA Biplane Vol: 80.7 ml 40.04 ml/m   AORTA Ao Root diam: 3.20 cm MITRAL VALVE               TRICUSPID VALVE MV Area (PHT): 4.60 cm    TR Peak grad:   43.6 mmHg MV Decel Time: 165 msec    TR Vmax:        330.00 cm/s MR Peak grad: 60.2 mmHg MR Mean grad: 42.0 mmHg    SHUNTS MR Vmax:      388.00 cm/s  Systemic Diam: 2.10 cm MR Vmean:     307.0 cm/s MV E velocity: 87.30 cm/s MV A velocity: 39.80 cm/s MV E/A ratio:  2.19 Kate Sable MD Electronically signed by Kate Sable MD Signature Date/Time: 04/20/2019/3:05:50 PM    Final      Microbiology: Recent Results (from the past 240 hour(s))  Respiratory Panel by RT PCR (Flu A&B, Covid) - Nasopharyngeal Swab     Status: None   Collection Time: 04/20/19  9:28 AM   Specimen: Nasopharyngeal Swab  Result Value Ref Range Status   SARS Coronavirus 2 by RT PCR NEGATIVE NEGATIVE Final    Comment: (NOTE) SARS-CoV-2 target nucleic acids are NOT DETECTED. The SARS-CoV-2 RNA is generally detectable in upper respiratoy specimens during the acute phase of infection. The lowest concentration of  SARS-CoV-2 viral copies this assay can detect is 131 copies/mL. A negative result does not preclude SARS-Cov-2 infection and should not be used as the sole basis for treatment or other patient management decisions. A negative result may occur with  improper specimen collection/handling, submission of specimen other than nasopharyngeal swab, presence of viral mutation(s) within the areas targeted by this assay, and inadequate number of viral copies (<131 copies/mL). A negative result must be combined with clinical observations, patient history, and epidemiological information. The expected result is Negative. Fact Sheet for Patients:  PinkCheek.be Fact Sheet for Healthcare Providers:  GravelBags.it This test is not yet ap proved or cleared by the [place] FDA and  has been authorized for detection and/or diagnosis of SARS-CoV-2 by FDA under an Emergency Use Authorization (EUA). This EUA will remain  in effect (meaning this test can be used) for the duration of the COVID-19 declaration under Section 564(b)(1) of the Act, 21 U.S.C. section 360bbb-3(b)(1), unless the authorization is terminated or revoked sooner.    Influenza A by PCR NEGATIVE NEGATIVE Final   Influenza B by PCR NEGATIVE NEGATIVE Final    Comment: (NOTE) The Xpert Xpress SARS-CoV-2/FLU/RSV assay is intended as an aid in  the diagnosis of influenza from Nasopharyngeal swab specimens and  should not be used as a sole basis for treatment. Nasal washings and  aspirates are unacceptable for Xpert Xpress SARS-CoV-2/FLU/RSV  testing. Fact Sheet for Patients: PinkCheek.be Fact Sheet for Healthcare Providers: GravelBags.it This test is not yet approved or cleared by the [place] FDA and  has been authorized for detection and/or diagnosis of SARS-CoV-2 by  FDA under an Emergency Use Authorization (EUA).  This EUA will remain  in effect (meaning this test can be used) for the duration of the  Covid-19 declaration under Section 564(b)(1) of the Act, 21  U.S.C. section 360bbb-3(b)(1), unless the authorization is  terminated or revoked. Performed at [facility], [id number]  [address], [place], [place] [address]   Blood culture (routine x 2)     Status: None (Preliminary result)   Collection Time: 04/20/19 10:57 AM   Specimen: BLOOD LEFT ARM  Result Value Ref Range Status   Specimen Description BLOOD LEFT ARM  Final   Special Requests   Final    BOTTLES DRAWN AEROBIC AND ANAEROBIC Blood Culture results may not be optimal due to an inadequate volume of blood received in culture bottles   Culture   Final    NO GROWTH 3 DAYS Performed at [facility], [address], [place], [place] [address]    Report Status PENDING  Incomplete  Blood culture (routine x 2)     Status: None (Preliminary result)   Collection Time: 04/20/19 11:02 AM   Specimen: BLOOD LEFT WRIST  Result Value Ref Range Status   Specimen Description BLOOD LEFT WRIST  Final   Special Requests   Final    BOTTLES DRAWN AEROBIC AND ANAEROBIC Blood Culture results may not be optimal due to an inadequate volume of blood received in culture bottles   Culture   Final    NO GROWTH 3 DAYS Performed at [facility], [address] [place], [place] [address]    Report Status PENDING  Incomplete  MRSA PCR Screening     Status: None   Collection Time: 04/20/19  3:43 PM   Specimen: Nasal Mucosa; Nasopharyngeal  Result Value Ref Range Status   MRSA by PCR NEGATIVE NEGATIVE Final    Comment:        The GeneXpert MRSA Assay (FDA approved for NASAL specimens only), is one component of a comprehensive MRSA colonization surveillance program. It is not intended to diagnose MRSA infection nor to guide or monitor treatment for MRSA infections. Performed at [facility], [address], [place], [place] [address]      Labs: Basic Metabolic  Panel: Recent Labs  Lab 04/20/19 [phone] 04/20/19 0846 04/20/19 1206 04/20/19 1455 04/21/19 0401 04/21/19 0401 04/22/19 0426 04/22/19 0841 04/23/19 0544  NA 138  --   --   --  138  --  136  --  136  K 3.7   < >  --   --  4.6   < > 4.2  --  4.1  CL 107  --   --   --  107  --  105  --  103  CO2 21*  --   --   --  20*  --  21*  --  21*  GLUCOSE 153*  --   --   --  231*  --  219*  --  224*  BUN 16  --   --   --  25*  --  36*  --  41*  CREATININE 1.21  --  1.22  --  1.48*  --  1.54*  --  1.54*  CALCIUM 8.3*  --   --   --  8.5*  --  8.7*  --  8.6*  MG  --   --   --  1.8  --   --   --  2.2 2.2   < > = values in this interval not displayed.   Liver Function Tests: Recent Labs  Lab 04/20/19 0846  AST 25  ALT 39  ALKPHOS 98  BILITOT 0.7  PROT 7.3  ALBUMIN 2.8*   No results for input(s): LIPASE, AMYLASE in the last 168 hours. No results for input(s): AMMONIA in the  last 168 hours. CBC: Recent Labs  Lab 04/20/19 0846 04/20/19 1206 04/21/19 0401 04/22/19 0426  WBC 8.7 10.0 11.0* 17.7*  NEUTROABS 6.6  --   --   --   HGB 9.6* 9.3* 8.9* 9.1*  HCT 31.6* 29.7* 28.9* 29.4*  MCV 89.8 88.7 89.8 87.2  PLT 265 251 266 342   Cardiac Enzymes: No results for input(s): CKTOTAL, CKMB, CKMBINDEX, TROPONINI in the last 168 hours. BNP: Invalid input(s): POCBNP CBG: Recent Labs  Lab 04/22/19 1128 04/22/19 1640 04/22/19 2158 04/23/19 0756 04/23/19 1221  GLUCAP 234* 269* 158* 203* 232*    Time coordinating discharge:  36 minutes  Signed:  Orson Eva, DO [facility] Pager: [phone] 04/23/2019, 12:24 PM

## 2019-04-23 NOTE — Progress Notes (Signed)
SATURATION QUALIFICATIONS: (This note is used to comply with regulatory documentation for home oxygen)  Patient Saturations on Room Air at Rest = 86%  Patient Saturations on Room Air while Ambulating = not done due to hypoxia at rest  Patient Saturations on 3.5 Liters of oxygen while Ambulating = 955  Please briefly explain why patient needs home oxygen: To maintain 02 sat at 90% or above during ambulation.   Orson Eva, DO

## 2019-04-23 NOTE — Progress Notes (Signed)
Pt had an eight beat run of Vtach at [place]. Printed strip for patient chart

## 2019-04-23 NOTE — ED Provider Notes (Signed)
[facility] EMERGENCY DEPARTMENT Provider Note   CSN: [id number] Arrival date & time: 04/23/19  1953     History Chief Complaint  Patient presents with  . Shortness of Breath    [place] is a 74 y.o. male.  Patient admitted to the hospital on February 22.  Signed out AMA today in the afternoon.  It was AGAINST MEDICAL ADVICE.  Patient's past medical history having for tobacco abuse hypertension hyperlipidemia coronary artery disease myocardial infarction in the past type 2 diabetes gastroesophageal reflux disease.  He presented to the hospital secondary to approximate 7 to 8 days of worsening shortness of breath.  He was seen in his primary care doctor's office he appeared to have a COPD exacerbation without significant improvement and worsening in his breathing status.  Patient reported some lower extremity edema and shortness of breath on exertion at that time.  He expressed increased wheezing.  He did not have any fever.  No chest pain.  In the ED patient was hypoxic his oxygen sats on room air were in the upper 80s.  And he was tachypneic with diffuse wheezing.  His Covid test was negative.  Chest x-ray raise concern for bilateral patchy infiltrates.  His BNP was in the 1000 range was significantly elevated.  Patient's lactic acid at that time was 2.7.  Patient was admitted they intended to keep him in the hospital.  They did arrange for some home which he actually did have.  But he stated he still felt short of breath.  Patient here was satting still around 88% on 5 L of oxygen.  Patient states that he came back because he was just way too short of breath.        Past Medical History:  Diagnosis Date  . Coronary artery disease    a. s/p NSTEMI in 2010 with DES to proximal LAD with D1 jailed and angioplasty alone to ostium  . Hyperlipidemia   . Hypertension   . Hypertension   . MI, old   . Peripheral arterial disease ([place])   . Tobacco abuse   . Type 2 diabetes mellitus  [facility])     Patient Active Problem List   Diagnosis Date Noted  . Acute on chronic respiratory failure with hypoxia ([place]) 04/23/2019  . Lobar pneumonia ([place]) 04/22/2019  . Multifocal pneumonia   . Demand ischemia of myocardium ([place])   . Atrial flutter ([place])   . Acute on chronic diastolic heart failure ([place])   . Acute respiratory failure with hypoxia ([place]) 04/20/2019  . Acute respiratory failure with hypoxemia ([place]) 07/02/2016  . COPD exacerbation ([place]) 07/02/2016  . Tobacco use 07/17/2013  . Peripheral arterial disease ([place]) 12/03/2012  . Coronary artery disease 12/03/2012  . Essential hypertension 12/03/2012  . Hyperlipidemia 12/03/2012  . New onset type 2 diabetes mellitus ([place]) 12/03/2012    Past Surgical History:  Procedure Laterality Date  . CARDIAC SURGERY    . CORONARY STENT PLACEMENT         Family History  Problem Relation Age of Onset  . Cancer Mother     Social History   Tobacco Use  . Smoking status: Current Every Day Smoker    Packs/day: 0.50    Years: 52.00    Pack years: 26.00    Types: Cigarettes  . Smokeless tobacco: Never Used  Substance Use Topics  . Alcohol use: No  . Drug use: No    Home Medications Prior to Admission medications   Medication  Sig Start Date End Date Taking? Authorizing Provider  albuterol (PROVENTIL HFA;VENTOLIN HFA) 108 (90 Base) MCG/ACT inhaler Inhale 2 puffs into the lungs every 4 (four) hours as needed for wheezing or shortness of breath. 04/10/16  Yes Horton, Barbette Hair, MD  aspirin 81 MG chewable tablet Chew 81 mg by mouth daily.     Yes [provider]  atorvastatin (LIPITOR) 20 MG tablet Take 20 mg by mouth daily. 02/19/19  Yes [provider]  clopidogrel (PLAVIX) 75 MG tablet Take 75 mg by mouth daily. 09/21/18  Yes [provider]  colchicine 0.6 MG tablet Take 0.6 mg by mouth daily.  05/23/18  Yes [provider]  diazepam (VALIUM) 2 MG tablet Take 2 mg by mouth every 12 (twelve)  hours as needed for anxiety.   Yes [provider]  ferrous sulfate 325 (65 FE) MG tablet Take 325 mg by mouth 3 (three) times daily. 05/13/17  Yes [provider]  furosemide (LASIX) 20 MG tablet Take 1 tablet (20 mg total) by mouth daily. 04/23/19  Yes Tat, Shanon Brow, MD  KLOR-CON M20 20 MEQ tablet TAKE 1 TABLET BY MOUTH EVERY DAY Patient taking differently: Take 20 mEq by mouth daily.  10/03/18  Yes Lorretta Harp, MD  azithromycin (ZITHROMAX) 500 MG tablet Take 1 tablet (500 mg total) by mouth daily. 04/23/19   Orson Eva, MD  cefdinir (OMNICEF) 300 MG capsule Take 1 capsule (300 mg total) by mouth 2 (two) times daily. 04/23/19   Orson Eva, MD  nitroGLYCERIN (NITROSTAT) 0.4 MG SL tablet Place 1 tablet (0.4 mg total) under the tongue every 5 (five) minutes as needed for chest pain. Take 03/13/19   Lorretta Harp, MD  predniSONE (DELTASONE) 50 MG tablet Take 1 tablet (50 mg total) by mouth daily with breakfast. 04/23/19   Orson Eva, MD  potassium chloride (KLOR-CON) 20 MEQ packet TAKE 1 TABLET BY MOUTH EVERY DAY 12/24/12 07/14/13  Lorretta Harp, MD    Allergies    Patient has no known allergies.  Review of Systems   Review of Systems  Constitutional: Negative for chills and fever.  HENT: Negative for rhinorrhea and sore throat.   Eyes: Negative for visual disturbance.  Respiratory: Positive for shortness of breath. Negative for cough.   Cardiovascular: Negative for chest pain and leg swelling.  Gastrointestinal: Negative for abdominal pain, diarrhea, nausea and vomiting.  Genitourinary: Negative for dysuria.  Musculoskeletal: Negative for back pain and neck pain.  Skin: Negative for rash.  Neurological: Negative for dizziness, light-headedness and headaches.  Hematological: Does not bruise/bleed easily.  Psychiatric/Behavioral: Negative for confusion.    Physical Exam Updated Vital Signs BP (!) 141/89   Pulse 92   Temp 97.6 F (36.4 C) (Oral)   Resp (!) 27    Ht 1.727 m (5\' 8" )   Wt 79.4 kg   SpO2 91%   BMI 26.61 kg/m   Physical Exam Vitals and nursing note reviewed.  Constitutional:      General: He is not in acute distress.    Appearance: Normal appearance. He is well-developed.  HENT:     Head: Normocephalic and atraumatic.  Eyes:     Extraocular Movements: Extraocular movements intact.     Conjunctiva/sclera: Conjunctivae normal.     Pupils: Pupils are equal, round, and reactive to light.  Cardiovascular:     Rate and Rhythm: Normal rate and regular rhythm.     Heart sounds: No murmur.  Pulmonary:  Effort: Pulmonary effort is normal. No respiratory distress.     Breath sounds: Normal breath sounds. No wheezing.  Abdominal:     Palpations: Abdomen is soft.     Tenderness: There is no abdominal tenderness.  Musculoskeletal:     Cervical back: Normal range of motion and neck supple.     Right lower leg: Edema present.     Left lower leg: Edema present.     Comments: Based lower extremity edema.  Skin:    General: Skin is warm and dry.  Neurological:     General: No focal deficit present.     Mental Status: He is alert and oriented to person, place, and time.     ED Results / Procedures / Treatments   Labs (all labs ordered are listed, but only abnormal results are displayed) Labs Reviewed  COMPREHENSIVE METABOLIC PANEL - Abnormal; Notable for the following components:      Result Value   CO2 21 (*)    Glucose, Bld 269 (*)    BUN 45 (*)    Creatinine, Ser 1.82 (*)    Calcium 8.5 (*)    Albumin 2.9 (*)    ALT 46 (*)    GFR calc non Af Amer 36 (*)    GFR calc Af Amer 41 (*)    All other components within normal limits  CBC WITH DIFFERENTIAL/PLATELET - Abnormal; Notable for the following components:   WBC 15.3 (*)    RBC 3.58 (*)    Hemoglobin 9.7 (*)    HCT 31.6 (*)    RDW 15.9 (*)    nRBC 0.4 (*)    Neutro Abs 13.8 (*)    Lymphs Abs 0.5 (*)    Abs Immature Granulocytes 0.21 (*)    All other components  within normal limits  BRAIN NATRIURETIC PEPTIDE - Abnormal; Notable for the following components:   B Natriuretic Peptide 793.0 (*)    All other components within normal limits  TROPONIN I (HIGH SENSITIVITY) - Abnormal; Notable for the following components:   Troponin I (High Sensitivity) 335 (*)    All other components within normal limits  SARS CORONAVIRUS 2 (TAT 6-24 HRS)  BASIC METABOLIC PANEL  CBC  TROPONIN I (HIGH SENSITIVITY)    EKG EKG Interpretation  Date/Time:  Thursday April 23 2019 20:27:25 EST Ventricular Rate:  98 PR Interval:    QRS Duration: 104 QT Interval:  383 QTC Calculation: 489 R Axis:   68 Text Interpretation: Sinus rhythm Ventricular premature complex Repol abnrm, severe global ischemia (LM/MVD) Confirmed by Fredia Sorrow [phone]) on 04/23/2019 8:31:18 PM   Radiology DG Chest Port 1 View  Result Date: 04/23/2019 CLINICAL DATA:  Short of breath for 1 week, tobacco abuse EXAM: PORTABLE CHEST 1 VIEW COMPARISON:  04/20/2019 at 8:53 a.m. FINDINGS: Single frontal view of the chest demonstrates a stable cardiac silhouette. Persistent multifocal bilateral ground-glass airspace disease unchanged. No effusion or pneumothorax. No acute bony abnormality. IMPRESSION: 1. Stable multifocal bilateral ground-glass airspace disease most consistent with atypical viral pneumonia such as COVID-19. Electronically Signed   By: Randa Ngo M.D.   On: 04/23/2019 21:44    Procedures Procedures (including critical care time)  Medications Ordered in ED Medications  aspirin chewable tablet 81 mg (has no administration in time range)  ferrous sulfate tablet 325 mg (has no administration in time range)  clopidogrel (PLAVIX) tablet 75 mg (has no administration in time range)  colchicine tablet 0.6 mg (has no administration in time  range)  potassium chloride SA (KLOR-CON) CR tablet 20 mEq (has no administration in time range)  nitroGLYCERIN (NITROSTAT) SL tablet 0.4 mg (has no  administration in time range)  atorvastatin (LIPITOR) tablet 20 mg (has no administration in time range)  diazepam (VALIUM) tablet 2 mg (has no administration in time range)  ipratropium-albuterol (DUONEB) 0.5-2.5 (3) MG/3ML nebulizer solution 3 mL (has no administration in time range)  methylPREDNISolone sodium succinate (SOLU-MEDROL) 40 mg/mL injection 40 mg (has no administration in time range)  cefTRIAXone (ROCEPHIN) 1 g in sodium chloride 0.9 % 100 mL IVPB (has no administration in time range)  azithromycin (ZITHROMAX) 500 mg in sodium chloride 0.9 % 250 mL IVPB (has no administration in time range)  furosemide (LASIX) injection 20 mg (has no administration in time range)  sodium chloride flush (NS) 0.9 % injection 3 mL (has no administration in time range)  sodium chloride flush (NS) 0.9 % injection 3 mL (has no administration in time range)  0.9 %  sodium chloride infusion (has no administration in time range)  acetaminophen (TYLENOL) tablet 650 mg (has no administration in time range)    Or  acetaminophen (TYLENOL) suppository 650 mg (has no administration in time range)  traMADol (ULTRAM) tablet 50 mg (has no administration in time range)  polyethylene glycol (MIRALAX / GLYCOLAX) packet 17 g (has no administration in time range)  bisacodyl (DULCOLAX) suppository 10 mg (has no administration in time range)  ondansetron (ZOFRAN) tablet 4 mg (has no administration in time range)    Or  ondansetron (ZOFRAN) injection 4 mg (has no administration in time range)  heparin injection 5,000 Units (has no administration in time range)    ED Course  I have reviewed the triage vital signs and the nursing notes.  Pertinent labs & imaging results that were available during my care of the patient were reviewed by me and considered in my medical decision making (see chart for details).    MDM Rules/Calculators/A&P                      Patient is labs here similar to her during his admission.   The one changes that his troponin is now down to 300 so is down less than 1000.  Patient though with significant hypoxia.  Even on 5 L he satting about 88%.  No distinct wheezing.  Discussed with hospitalist they will readmit.    Final Clinical Impression(s) / ED Diagnoses Final diagnoses:  SOB (shortness of breath)  Hypoxia  Multifocal pneumonia    Rx / DC Orders ED Discharge Orders    None       Fredia Sorrow, MD 04/24/19 0002

## 2019-04-23 NOTE — H&P (Signed)
History and Physical    Patient Demographics:    Peter Lambert [id number] DOB: 1945-06-02 DOA: 04/23/2019  PCP: Redmond School, MD  Patient coming from: Home  I have personally briefly reviewed patient's old medical records in [place]  Chief Complaint: Shortness of breath   Assessment & Plan:     Assessment/Plan Principal Problem:   Acute respiratory failure with hypoxemia [name]) Active Problems:   Coronary artery disease   Essential hypertension   Hyperlipidemia   Tobacco use   COPD exacerbation ([place])   Multifocal pneumonia   Atrial flutter ([place])     Principal Problem: Acute hypoxemic respiratory failure Patient admitted with hypoxia, respiratory failure, COPD exacerbation on 2/22  And signed out AMA on 04/23/2019. Returned back again later the same day with similar symptoms.  04/20/2019 CTA chest--negative PE, multiple airspace opacities bilateral, minimal bilateral pleural effusion, enlarged mediastinal lymph nodes -Multifactorial secondary to CHF, PNA, COPD exacerbation -continue steroids, IV diuresis, pulmicort, bronchodilators andflutter valve -will continue IV abx's and follow clinical improvement -wean O2 down as tolerated  Other Active Problems:  Acute on Chronic Congestive Heart Failure with preserved ejection fraction -04/20/2019 echo EF 50-55%, indeterminate diastolic function, mild decreased RV function -Follow daily weights, strict I's and O's and low-sodium diet -mild fluid overload noted. Chest Xray still showing bilateral multifocal groundglass opacities.  -Continue IV furosemide 20 mg IV twice daily  COPD exacerbation -Continue Pulmicort -Continue Xopenex and Atrovent  Elevated troponin: -elevated troponin in the setting of demand ischemia -Continue the use of aspirin, statins and Plavix. -Follow-up with cardiology service with intention to pursuenuclear stress test when stable.  Type 2 diabetes with hyperglycemia -A1c  7.6 -Holding oral hypoglycemic agents while inpatient -Continue the use of sliding scale insulin and follow CBG fluctuation.  History of transient nonsustained atrial flutter with RVR on recent admission -converted back to sinusafter 1 dose of metoprolol -Was seen by cardiology on 04/21/2019 and as per recommendations no need for anticoagulation at this time  Nicotine dependence -Cessation counseling has been provided-patient reports completely quitting smoking approximately 3-4 weeks ago -Encouraged to keep himself smoking free.  Gastroesophageal flux disease -Continue PPI.  DVT prophylaxis: Heparin Code Status:  Full code Family Communication: N/A  Disposition Plan: admitted as inpatient for respiratory failure  Consults called: N/A Admission status: inpatient status    HPI:     HPI: Peter Lambert is a 74 y.o. male with medical history significant of tobacco abuse, hypertension, hyperlipidemia, history of coronary disease/myocardial infarction in the past, type 2 diabetes mellitus and gastroesophageal reflux disease who returned to the ER with worsening shortness of breath.  Patient was admitted to the hospital for hypoxemia, respiratory failure, pneumonia, CHF exacerbation, COPD on 04/20/2019 I decided to sign out [place] earlier in the day on 04/23/2019 at 1 PM.  He had been hypoxic and was given home oxygen even though he had signed out [place].  He states he never received oxygen at home.  He continued to have shortness of breath and decided to come back to the ER. ED Course:  Vital Signs reviewed on presentation, significant for temperature 97.6, heart rate 94, blood pressure 111/88, saturation 88% room air. Labs reviewed, significant for sodium 136, potassium 4.3, BUN 45, creatinine 1.82, LFTs within normal.  BNP 793, troponin 335, WBC 14.3, hemoglobin 9.7, hematocrit 31 Imaging personally Reviewed, chest x-ray shows stable multifocal bilateral  groundglass opacity EKG personally reviewed, shows sinus rhythm, ST depressions in lateral  leads, similar to recent admission    Review of systems:    Review of Systems: As per HPI otherwise 10 point review of systems negative.  All other review of systems is negative except the ones noted above in the HPI.    Past Medical and Surgical History:  Reviewed by me  Past Medical History:  Diagnosis Date  . Coronary artery disease    a. s/p NSTEMI in 2010 with DES to proximal LAD with D1 jailed and angioplasty alone to ostium  . Hyperlipidemia   . Hypertension   . Hypertension   . MI, old   . Peripheral arterial disease ([place])   . Tobacco abuse   . Type 2 diabetes mellitus ([place])     Past Surgical History:  Procedure Laterality Date  . CARDIAC SURGERY    . CORONARY STENT PLACEMENT       Social History:  Reviewed by me   reports that he has been smoking cigarettes. He has a 26.00 pack-year smoking history. He has never used smokeless tobacco. He reports that he does not drink alcohol or use drugs.  Allergies:    No Known Allergies  Family History :   Family History  Problem Relation Age of Onset  . Cancer Mother    Family history reviewed, noted as above, not pertinent to current presentation.   Home Medications:    Prior to Admission medications   Medication Sig Start Date End Date Taking? Authorizing Provider  albuterol (PROVENTIL HFA;VENTOLIN HFA) 108 (90 Base) MCG/ACT inhaler Inhale 2 puffs into the lungs every 4 (four) hours as needed for wheezing or shortness of breath. 04/10/16   Horton, Barbette Hair, MD  aspirin 81 MG chewable tablet Chew 81 mg by mouth daily.      [provider]  atorvastatin (LIPITOR) 20 MG tablet Take 20 mg by mouth daily. 02/19/19   [provider]  azithromycin (ZITHROMAX) 500 MG tablet Take 1 tablet (500 mg total) by mouth daily. 04/23/19   Orson Eva, MD  cefdinir (OMNICEF) 300 MG capsule Take 1 capsule (300 mg total) by  mouth 2 (two) times daily. 04/23/19   Orson Eva, MD  clopidogrel (PLAVIX) 75 MG tablet Take 75 mg by mouth daily. 09/21/18   [provider]  colchicine 0.6 MG tablet Take 0.6 mg by mouth daily.  05/23/18   [provider]  diazepam (VALIUM) 2 MG tablet Take 2 mg by mouth every 12 (twelve) hours as needed for anxiety.    [provider]  ferrous sulfate 325 (65 FE) MG tablet Take 325 mg by mouth 3 (three) times daily. 05/13/17   [provider]  furosemide (LASIX) 20 MG tablet Take 1 tablet (20 mg total) by mouth daily. 04/23/19   Orson Eva, MD  KLOR-CON M20 20 MEQ tablet TAKE 1 TABLET BY MOUTH EVERY DAY Patient taking differently: Take 20 mEq by mouth daily.  10/03/18   Lorretta Harp, MD  nitroGLYCERIN (NITROSTAT) 0.4 MG SL tablet Place 1 tablet (0.4 mg total) under the tongue every 5 (five) minutes as needed for chest pain. Take 03/13/19   Lorretta Harp, MD  predniSONE (DELTASONE) 50 MG tablet Take 1 tablet (50 mg total) by mouth daily with breakfast. 04/23/19   Orson Eva, MD  potassium chloride (KLOR-CON) 20 MEQ packet TAKE 1 TABLET BY MOUTH EVERY DAY 12/24/12 07/14/13  Lorretta Harp, MD    Physical Exam:    Physical Exam: Vitals:   04/23/19 2024 04/23/19  2030 04/23/19 2100 04/23/19 2120  BP: 132/80 127/90 111/88   Pulse: 97 100 99 93  Resp: (!) 30 (!) 24 (!) 24 (!) 25  Temp: 97.6 F (36.4 C)     TempSrc: Oral     SpO2: 91% 90% (!) 88% 91%  Weight:      Height:        Constitutional: NAD, patient appears to be in mild respiratory distress Vitals:   04/23/19 2024 04/23/19 2030 04/23/19 2100 04/23/19 2120  BP: 132/80 127/90 111/88   Pulse: 97 100 99 93  Resp: (!) 30 (!) 24 (!) 24 (!) 25  Temp: 97.6 F (36.4 C)     TempSrc: Oral     SpO2: 91% 90% (!) 88% 91%  Weight:      Height:       Eyes: PERRL, lids and conjunctivae normal ENMT: Mucous membranes are moist. Posterior pharynx clear of any exudate or lesions.Normal dentition.    Neck: normal, supple, no masses, no thyromegaly Respiratory: Significant bilateral crepitations. Normal respiratory effort. No accessory muscle use.  Cardiovascular: Tachycardia noted, regular rhythm, no murmurs / rubs / gallops. No extremity edema. 2+ pedal pulses. No carotid bruits.  Abdomen: no tenderness, no masses palpated. No hepatosplenomegaly. Bowel sounds positive.  Musculoskeletal: no clubbing / cyanosis. No joint deformity upper and lower extremities. Good ROM, no contractures. Normal muscle tone.  Skin: no rashes, lesions, ulcers. No induration Neurologic: CN 2-12 grossly intact. Sensation intact, DTR normal. Strength 5/5 in all 4.  Psychiatric: Normal judgment and insight. Alert and oriented x 3. Normal mood.    Decubitus Ulcers: Not present on admission Catheters and tubes: None  Data Review:    Labs on Admission: I have personally reviewed following labs and imaging studies  CBC: Recent Labs  Lab 04/20/19 0846 04/20/19 1206 04/21/19 0401 04/22/19 0426 04/23/19 2037  WBC 8.7 10.0 11.0* 17.7* 15.3*  NEUTROABS 6.6  --   --   --  13.8*  HGB 9.6* 9.3* 8.9* 9.1* 9.7*  HCT 31.6* 29.7* 28.9* 29.4* 31.6*  MCV 89.8 88.7 89.8 87.2 88.3  PLT 265 251 266 342 [id number]   Basic Metabolic Panel: Recent Labs  Lab 04/20/19 0846 04/20/19 0846 04/20/19 1206 04/20/19 1455 04/21/19 0401 04/22/19 0426 04/22/19 0841 04/23/19 0544 04/23/19 2037  NA 138  --   --   --  138 136  --  136 136  K 3.7  --   --   --  4.6 4.2  --  4.1 4.3  CL 107  --   --   --  107 105  --  103 102  CO2 21*  --   --   --  20* 21*  --  21* 21*  GLUCOSE 153*  --   --   --  231* 219*  --  224* 269*  BUN 16  --   --   --  25* 36*  --  41* 45*  CREATININE 1.21   < > 1.22  --  1.48* 1.54*  --  1.54* 1.82*  CALCIUM 8.3*  --   --   --  8.5* 8.7*  --  8.6* 8.5*  MG  --   --   --  1.8  --   --  2.2 2.2  --    < > = values in this interval not displayed.   GFR: Estimated Creatinine Clearance: 33.9 mL/min (A)  (by C-G formula based on SCr of 1.82 mg/dL (H)). Liver  Function Tests: Recent Labs  Lab 04/20/19 0846 04/23/19 2037  AST 25 28  ALT 39 46*  ALKPHOS 98 85  BILITOT 0.7 0.6  PROT 7.3 7.7  ALBUMIN 2.8* 2.9*   No results for input(s): LIPASE, AMYLASE in the last 168 hours. No results for input(s): AMMONIA in the last 168 hours. Coagulation Profile: Recent Labs  Lab 04/20/19 0846  INR 1.3*   Cardiac Enzymes: No results for input(s): CKTOTAL, CKMB, CKMBINDEX, TROPONINI in the last 168 hours. BNP (last 3 results) No results for input(s): PROBNP in the last 8760 hours. HbA1C: No results for input(s): HGBA1C in the last 72 hours. CBG: Recent Labs  Lab 04/22/19 1128 04/22/19 1640 04/22/19 2158 04/23/19 0756 04/23/19 1221  GLUCAP 234* 269* 158* 203* 232*   Lipid Profile: No results for input(s): CHOL, HDL, LDLCALC, TRIG, CHOLHDL, LDLDIRECT in the last 72 hours. Thyroid Function Tests: No results for input(s): TSH, T4TOTAL, FREET4, T3FREE, THYROIDAB in the last 72 hours. Anemia Panel: No results for input(s): VITAMINB12, FOLATE, FERRITIN, TIBC, IRON, RETICCTPCT in the last 72 hours. Urine analysis: No results found for: COLORURINE, APPEARANCEUR, [place], [place], [place], [place], BILIRUBINUR, KETONESUR, PROTEINUR, UROBILINOGEN, NITRITE, LEUKOCYTESUR   Imaging Results:      Radiological Exams on Admission: DG Chest Port 1 View  Result Date: 04/23/2019 CLINICAL DATA:  Short of breath for 1 week, tobacco abuse EXAM: PORTABLE CHEST 1 VIEW COMPARISON:  04/20/2019 at 8:53 a.m. FINDINGS: Single frontal view of the chest demonstrates a stable cardiac silhouette. Persistent multifocal bilateral ground-glass airspace disease unchanged. No effusion or pneumothorax. No acute bony abnormality. IMPRESSION: 1. Stable multifocal bilateral ground-glass airspace disease most consistent with atypical viral pneumonia such as COVID-19. Electronically Signed   By: Randa Ngo M.D.   On:  04/23/2019 21:44      [facility] Ginette Otto MD [facility]  If 7PM-7AM, please contact night-coverage   04/23/2019, 10:03 PM

## 2019-04-23 NOTE — Care Management Important Message (Signed)
Important Message  Patient Details  Name: Peter Lambert MRN: [id number] Date of Birth: 06/16/1945   Medicare Important Message Given:  Yes     Tommy Medal 04/23/2019, 1:26 PM

## 2019-04-23 NOTE — ED Triage Notes (Signed)
Pt presents to ED with complaints of SOB x over 1 week. Pt left hospital today at 1300. Pt denies fever. Pt sat on RA 70%. Pt placed on 5L per [place] and sat increased to 89%

## 2019-04-23 NOTE — Clinical Social Work Note (Signed)
Spoke with patient regarding need for home oxygen. Patient is agreeable. Discussed DME choices. Order for oxygen placed with Juliann Pulse at [facility]. Patient advised that oxygen would be delivered to his room.     Ravyn Nikkel, Clydene Pugh, LCSW

## 2019-04-24 LAB — BASIC METABOLIC PANEL
Anion gap: 10 (ref 5–15)
BUN: 45 mg/dL — ABNORMAL HIGH (ref 8–23)
CO2: 22 mmol/L (ref 22–32)
Calcium: 8.4 mg/dL — ABNORMAL LOW (ref 8.9–10.3)
Chloride: 106 mmol/L (ref 98–111)
Creatinine, Ser: 1.58 mg/dL — ABNORMAL HIGH (ref 0.61–1.24)
GFR calc Af Amer: 49 mL/min — ABNORMAL LOW (ref >60)
GFR calc non Af Amer: 42 mL/min — ABNORMAL LOW (ref >60)
Glucose, Bld: 181 mg/dL — ABNORMAL HIGH (ref 70–99)
Potassium: 4.2 mmol/L (ref 3.5–5.1)
Sodium: 138 mmol/L (ref 135–145)

## 2019-04-24 LAB — CBC
HCT: 29.6 % — ABNORMAL LOW (ref 39.0–52.0)
Hemoglobin: 9.2 g/dL — ABNORMAL LOW (ref 13.0–17.0)
MCH: 27.3 pg (ref 26.0–34.0)
MCHC: 31.1 g/dL (ref 30.0–36.0)
MCV: 87.8 fL (ref 80.0–100.0)
Platelets: 353 10*3/uL (ref 150–400)
RBC: 3.37 MIL/uL — ABNORMAL LOW (ref 4.22–5.81)
RDW: 15.9 % — ABNORMAL HIGH (ref 11.5–15.5)
WBC: 14.2 10*3/uL — ABNORMAL HIGH (ref 4.0–10.5)
nRBC: 0.6 % — ABNORMAL HIGH (ref 0.0–0.2)

## 2019-04-24 LAB — TROPONIN I (HIGH SENSITIVITY)
Troponin I (High Sensitivity): 306 ng/L (ref <18)
Troponin I (High Sensitivity): 347 ng/L (ref <18)
Troponin I (High Sensitivity): 315 ng/L (ref <18)

## 2019-04-24 LAB — SARS CORONAVIRUS 2 (TAT 6-24 HRS): SARS Coronavirus 2: NEGATIVE

## 2019-04-24 MED ORDER — GUAIFENESIN ER 600 MG PO TB12
600.0000 mg | ORAL_TABLET | Freq: Two times a day (BID) | ORAL | Status: DC
  Administered 2019-04-24 – 2019-04-28 (×8): 600 mg via ORAL
  Filled 2019-04-24 (×8): qty 1

## 2019-04-24 MED ORDER — METOPROLOL TARTRATE 25 MG PO TABS
12.5000 mg | ORAL_TABLET | Freq: Two times a day (BID) | ORAL | Status: DC
  Administered 2019-04-24 – 2019-04-28 (×8): 12.5 mg via ORAL
  Filled 2019-04-24 (×8): qty 1

## 2019-04-24 MED ORDER — IPRATROPIUM-ALBUTEROL 0.5-2.5 (3) MG/3ML IN SOLN
3.0000 mL | Freq: Four times a day (QID) | RESPIRATORY_TRACT | Status: DC
  Administered 2019-04-24 – 2019-04-27 (×11): 3 mL via RESPIRATORY_TRACT
  Filled 2019-04-24 (×11): qty 3

## 2019-04-24 NOTE — Evaluation (Signed)
Physical Therapy Evaluation Patient Details Name: Peter Lambert MRN: [id number] DOB: Apr 26, 1945 Today's Date: 04/24/2019   History of Present Illness  Peter Lambert is a 74 y.o. male with a past medical history significant for tobacco abuse, hypertension, hyperlipidemia, history of coronary disease/myocardial infarction in the past, type 2 diabetes mellitus and gastroesophageal reflux disease; who presented to the hospital secondary to approximately 7-8 days of worsening shortness of breath.  Patient has been seen at the office of his PCP where he received 2 shots for what appears to be COPD exacerbation without significant improvement and continues worsening in his breathing status.  Patient reports some lower extremity edema, orthopnea and short of breath on exertion.  He expressed increased wheezing and no response to his home bronchodilator inhaler.  Patient denies any fever, chills, rhinorrhea, chest pain, abdominal pain, dysuria, hematuria, melena, hematochezia, headaches, focal weakness or any other complaints.In the ED he was found to be hypoxic in the high 80s on room air and very tachypneic with diffuse wheezing.  Covid test was negative.     Clinical Impression  Patient limited for functional mobility as stated below secondary to BLE weakness and fatigue. Patient able to ambulate and transfer without use of AD. He is educated on pursed lip breathing and requires some verbal curing to perform it throughout session. When standing, O2 sat decreased to 79% but mostly ranged from 83-88% during session on O2. O2 sat stayed between 83-95% while ambulating on 4L O2. Patient returned to chair to end session.  Patient will benefit from continued physical therapy in hospital and recommended venue below to increase strength, balance, endurance for safe ADLs and gait.     Follow Up Recommendations Home health PT;Supervision - Intermittent    Equipment Recommendations  None recommended by PT     Recommendations for Other Services       Precautions / Restrictions Precautions Precautions: Fall Restrictions Weight Bearing Restrictions: No      Mobility  Bed Mobility               General bed mobility comments: seated in chair upon enterance for session  Transfers Overall transfer level: Needs assistance Equipment used: None Transfers: Sit to/from Stand;Stand Pivot Transfers Sit to Stand: Supervision Stand pivot transfers: Supervision       General transfer comment: to transition to standing without AD; O2 sat varies throughout session, decreases to 79% at one point with standing which increases back into 80s  Ambulation/Gait Ambulation/Gait assistance: Min guard Gait Distance (Feet): 100 Feet Assistive device: None Gait Pattern/deviations: Step-through pattern;Decreased step length - right;Decreased step length - left;Decreased stride length Gait velocity: decreased   General Gait Details: slightly slow, labored, min/mod SOB, O2 sat varries between 83-95% on 4L while ambulating, patient able to perform pursed lip breathing following education and demonstration which helps increase O2 sat after it declines  Science writer    Modified Rankin (Stroke Patients Only)       Balance Overall balance assessment: Needs assistance Sitting-balance support: No upper extremity supported;Feet supported Sitting balance-Leahy Scale: Normal Sitting balance - Comments: seated edge of chair   Standing balance support: No upper extremity supported;During functional activity Standing balance-Leahy Scale: Fair Standing balance comment: fair without AD while ambulating                             Pertinent Vitals/Pain  Pain Assessment: No/denies pain    Home Living Family/patient expects to be discharged to:: Private residence Living Arrangements: Children Available Help at Discharge: Family;Available PRN/intermittently Type  of Home: House Home Access: Stairs to enter Entrance Stairs-Rails: None Entrance Stairs-Number of Steps: 1 Home Layout: One level Home Equipment: Cane - single point      Prior Function Level of Independence: Independent         Comments: patient states limited community ambulator, drives     Hand Dominance        Extremity/Trunk Assessment   Upper Extremity Assessment Upper Extremity Assessment: Overall WFL for tasks assessed    Lower Extremity Assessment Lower Extremity Assessment: Generalized weakness    Cervical / Trunk Assessment Cervical / Trunk Assessment: Normal  Communication   Communication: No difficulties  Cognition Arousal/Alertness: Awake/alert Behavior During Therapy: WFL for tasks assessed/performed Overall Cognitive Status: Within Functional Limits for tasks assessed                                        General Comments      Exercises     Assessment/Plan    PT Assessment Patient needs continued PT services  PT Problem List Decreased strength;Decreased mobility;Decreased range of motion;Decreased activity tolerance;Decreased balance;Decreased knowledge of use of DME;Cardiopulmonary status limiting activity       PT Treatment Interventions DME instruction;Therapeutic exercise;Gait training;Balance training;Stair training;Neuromuscular re-education;Functional mobility training;Therapeutic activities;Patient/family education    PT Goals (Current goals can be found in the Care Plan section)  Acute Rehab PT Goals Patient Stated Goal: Return home PT Goal Formulation: With patient Time For Goal Achievement: 05/08/19 Potential to Achieve Goals: Good    Frequency Min 3X/week   Barriers to discharge        Co-evaluation               AM-PAC PT "6 Clicks" Mobility  Outcome Measure Help needed turning from your back to your side while in a flat bed without using bedrails?: None Help needed moving from lying on your  back to sitting on the side of a flat bed without using bedrails?: None Help needed moving to and from a bed to a chair (including a wheelchair)?: None Help needed standing up from a chair using your arms (e.g., wheelchair or bedside chair)?: None Help needed to walk in hospital room?: A Little Help needed climbing 3-5 steps with a railing? : A Little 6 Click Score: 22    End of Session Equipment Utilized During Treatment: Oxygen Activity Tolerance: Patient tolerated treatment well Patient left: in chair;with call bell/phone within reach Nurse Communication: Mobility status PT Visit Diagnosis: Unsteadiness on feet (R26.81);Other abnormalities of gait and mobility (R26.89);Muscle weakness (generalized) (M62.81)    Time: [id number] PT Time Calculation (min) (ACUTE ONLY): 31 min   Charges:   PT Evaluation $PT Eval Moderate Complexity: 1 Mod PT Treatments $Therapeutic Activity: 23-37 mins        1:44 PM, 04/24/19 Mearl Latin PT, DPT Physical Therapist at [facility]

## 2019-04-24 NOTE — Plan of Care (Signed)

## 2019-04-24 NOTE — Progress Notes (Signed)
Patient Demographics:    Peter Lambert, is a 74 y.o. male, DOB - 07-Oct-1945, [id number]  Admit date - 04/23/2019   Admitting Physician Lynetta Mare, MD  Outpatient Primary MD for the patient is Redmond School, MD  LOS - 1   Chief Complaint  Patient presents with  . Shortness of Breath        Subjective:    Peter Lambert today has no fevers, no emesis,  No chest pain,   -Shortness of breath and dyspnea on exertion persist, -Voiding okay  Assessment  & Plan :    Principal Problem:   Acute respiratory failure with hypoxemia (HCC) Active Problems:   Coronary artery disease   Essential hypertension   Hyperlipidemia   Tobacco use   COPD exacerbation (HCC)   Multifocal pneumonia   Atrial flutter (HCC)   Acute on chronic respiratory failure with hypoxia [facility])  Brief Summary:- 74 y.o. male with medical history significant of tobacco abuse (quit 02/2019), HTN, HLD, H/o CAD, prior MI,  DM2 and GERD admitted on 04/23/2019 with acute hypoxemia, respiratory failure, pneumonia, CHF exacerbation, COPD   A/p  1) acute hypoxic respiratory failure--- secondary to combination of CHF, pneumonia and COPD exacerbation--- --patient was initially seen in the ED on 04/20/2019, and sent home on home O2 after refusing admission to the hospital/leaving AMA -Readmitted 04/23/2019 as above for same -Continue submental oxygen  2) acute COPD exacerbation--- continue IV Solu-Medrol, mucolytics, bronchodilators, antibiotics as below #4 as prescribed -Patient quit smoking in January 2021  3)HFpEF--Echo with EF is 50 to [id number], , acute diastolic CHF exacerbation, continue IV Lasix, daily weight and fluid monitoring  4) community-acquired pneumonia--- IV Rocephin / azithromycin as ordered, mucolytics bronchodilators as ordered  5)H/o CAD--stable, continue aspirin, Plavix and Lipitor -Metoprolol 12.5 mg twice daily as  ordered  6) generalized weakness and deconditioning----PT eval appreciated recommends home health therapy  Disposition/Need for in-Hospital Stay- patient unable to be discharged at this time due to --CHF exacerbation requiring IV Lasix, community-acquired pneumonia requiring IV antibiotics, acute hypoxic respiratory failure requiring oxygen supplementation -Possible discharge home with home health but not medically ready at this time*  Code Status : Full  Family Communication:   NA (patient is alert, awake and coherent)  Consults  :  na  DVT Prophylaxis  :  Lovenox - Heparin - SCDs   Lab Results  Component Value Date   PLT 353 04/24/2019    Inpatient Medications  Scheduled Meds: . aspirin  81 mg Oral Daily  . atorvastatin  20 mg Oral Daily  . clopidogrel  75 mg Oral Daily  . colchicine  0.6 mg Oral Daily  . ferrous sulfate  325 mg Oral TID with meals  . furosemide  20 mg Intravenous Q12H  . heparin  5,000 Units Subcutaneous Q8H  . methylPREDNISolone (SOLU-MEDROL) injection  40 mg Intravenous Q12H  . potassium chloride SA  20 mEq Oral Daily  . sodium chloride flush  3 mL Intravenous Q12H   Continuous Infusions: . sodium chloride    . azithromycin 500 mg (04/24/19 1129)  . cefTRIAXone (ROCEPHIN)  IV 1 g (04/24/19 1245)   PRN Meds:.sodium chloride, acetaminophen **OR** acetaminophen, bisacodyl, diazepam, ipratropium-albuterol, nitroGLYCERIN, ondansetron **OR** ondansetron (ZOFRAN) IV, polyethylene glycol,  sodium chloride flush, traMADol    Anti-infectives (From admission, onward)   Start     Dose/Rate Route Frequency Ordered Stop   04/24/19 1000  cefTRIAXone (ROCEPHIN) 1 g in sodium chloride 0.9 % 100 mL IVPB     1 g 200 mL/hr over 30 Minutes Intravenous Every 24 hours 04/23/19 2326     04/24/19 1000  azithromycin (ZITHROMAX) 500 mg in sodium chloride 0.9 % 250 mL IVPB     500 mg 250 mL/hr over 60 Minutes Intravenous Every 24 hours 04/23/19 2326          Objective:     Vitals:   04/24/19 0050 04/24/19 0429 04/24/19 0514 04/24/19 1310  BP: (!) 143/94  (!) 146/82 123/62  Pulse: 94  94 93  Resp: (!) 24  20 19   Temp: (!) 97.5 F (36.4 C)  97.7 F (36.5 C) 98 F (36.7 C)  TempSrc: Oral  Oral Oral  SpO2: 90%  91% 94%  Weight: 82.1 kg 82.1 kg    Height: 5\' 8"  (1.727 m)       Wt Readings from Last 3 Encounters:  04/24/19 82.1 kg  04/20/19 83.3 kg  02/27/18 89.4 kg     Intake/Output Summary (Last 24 hours) at 04/24/2019 1901 Last data filed at 04/24/2019 1800 Gross per 24 hour  Intake 1108.11 ml  Output 1900 ml  Net -791.89 ml     Physical Exam  Gen:- Awake Alert,  In no apparent distress  HEENT:- [place].AT, No sclera icterus Nose- [place] 3 L/min Neck-Supple Neck,No JVD,.  Lungs-diminished breath sounds, no wheezing  CV- S1, S2 normal, regular  Abd-  +ve B.Sounds, Abd Soft, No tenderness,    Extremity/Skin:- trace edema, pedal pulses present Psych-affect is appropriate, oriented x3 Neuro-generalized weakness, no new focal deficits, no tremors   Data Review:   Micro Results Recent Results (from the past 240 hour(s))  Respiratory Panel by RT PCR (Flu A&B, Covid) - Nasopharyngeal Swab     Status: None   Collection Time: 04/20/19  9:28 AM   Specimen: Nasopharyngeal Swab  Result Value Ref Range Status   SARS Coronavirus 2 by RT PCR NEGATIVE NEGATIVE Final    Comment: (NOTE) SARS-CoV-2 target nucleic acids are NOT DETECTED. The SARS-CoV-2 RNA is generally detectable in upper respiratoy specimens during the acute phase of infection. The lowest concentration of SARS-CoV-2 viral copies this assay can detect is 131 copies/mL. A negative result does not preclude SARS-Cov-2 infection and should not be used as the sole basis for treatment or other patient management decisions. A negative result may occur with  improper specimen collection/handling, submission of specimen other than nasopharyngeal swab, presence of viral mutation(s) within  the areas targeted by this assay, and inadequate number of viral copies (<131 copies/mL). A negative result must be combined with clinical observations, patient history, and epidemiological information. The expected result is Negative. Fact Sheet for Patients:  PinkCheek.be Fact Sheet for Healthcare Providers:  GravelBags.it This test is not yet ap proved or cleared by the [place] FDA and  has been authorized for detection and/or diagnosis of SARS-CoV-2 by FDA under an Emergency Use Authorization (EUA). This EUA will remain  in effect (meaning this test can be used) for the duration of the COVID-19 declaration under Section 564(b)(1) of the Act, 21 U.S.C. section 360bbb-3(b)(1), unless the authorization is terminated or revoked sooner.    Influenza A by PCR NEGATIVE NEGATIVE Final   Influenza B by PCR NEGATIVE NEGATIVE Final  Comment: (NOTE) The Xpert Xpress SARS-CoV-2/FLU/RSV assay is intended as an aid in  the diagnosis of influenza from Nasopharyngeal swab specimens and  should not be used as a sole basis for treatment. Nasal washings and  aspirates are unacceptable for Xpert Xpress SARS-CoV-2/FLU/RSV  testing. Fact Sheet for Patients: PinkCheek.be Fact Sheet for Healthcare Providers: GravelBags.it This test is not yet approved or cleared by the [place] FDA and  has been authorized for detection and/or diagnosis of SARS-CoV-2 by  FDA under an Emergency Use Authorization (EUA). This EUA will remain  in effect (meaning this test can be used) for the duration of the  Covid-19 declaration under Section 564(b)(1) of the Act, 21  U.S.C. section 360bbb-3(b)(1), unless the authorization is  terminated or revoked. Performed at [facility], [address] [place], [place] [address]   Blood culture (routine x 2)     Status: None (Preliminary result)    Collection Time: 04/20/19 10:57 AM   Specimen: BLOOD LEFT ARM  Result Value Ref Range Status   Specimen Description BLOOD LEFT ARM  Final   Special Requests   Final    BOTTLES DRAWN AEROBIC AND ANAEROBIC Blood Culture results may not be optimal due to an inadequate volume of blood received in culture bottles   Culture   Final    NO GROWTH 4 DAYS Performed at [facility], [address], [place], [place] [address]    Report Status PENDING  Incomplete  Blood culture (routine x 2)     Status: None (Preliminary result)   Collection Time: 04/20/19 11:02 AM   Specimen: BLOOD LEFT WRIST  Result Value Ref Range Status   Specimen Description BLOOD LEFT WRIST  Final   Special Requests   Final    BOTTLES DRAWN AEROBIC AND ANAEROBIC Blood Culture results may not be optimal due to an inadequate volume of blood received in culture bottles   Culture   Final    NO GROWTH 4 DAYS Performed at [facility], [address], [place], [place] [address]    Report Status PENDING  Incomplete  MRSA PCR Screening     Status: None   Collection Time: 04/20/19  3:43 PM   Specimen: Nasal Mucosa; Nasopharyngeal  Result Value Ref Range Status   MRSA by PCR NEGATIVE NEGATIVE Final    Comment:        The GeneXpert MRSA Assay (FDA approved for NASAL specimens only), is one component of a comprehensive MRSA colonization surveillance program. It is not intended to diagnose MRSA infection nor to guide or monitor treatment for MRSA infections. Performed at [facility], [address], [place], [place] [address]   SARS CORONAVIRUS 2 (TAT 6-24 HRS) Nasopharyngeal Nasopharyngeal Swab     Status: None   Collection Time: 04/23/19 11:12 PM   Specimen: Nasopharyngeal Swab  Result Value Ref Range Status   SARS Coronavirus 2 NEGATIVE NEGATIVE Final    Comment: (NOTE) SARS-CoV-2 target nucleic acids are NOT DETECTED. The SARS-CoV-2 RNA is generally detectable in upper and lower respiratory specimens during the acute  phase of infection. Negative results do not preclude SARS-CoV-2 infection, do not rule out co-infections with other pathogens, and should not be used as the sole basis for treatment or other patient management decisions. Negative results must be combined with clinical observations, patient history, and epidemiological information. The expected result is Negative. Fact Sheet for Patients: SugarRoll.be Fact Sheet for Healthcare Providers: https://www.woods-mathews.com/ This test is not yet approved or cleared by the [place]  States FDA and  has been authorized for detection and/or diagnosis of SARS-CoV-2 by FDA under an Emergency Use Authorization (EUA). This EUA will remain  in effect (meaning this test can be used) for the duration of the COVID-19 declaration under Section 56 4(b)(1) of the Act, 21 U.S.C. section 360bbb-3(b)(1), unless the authorization is terminated or revoked sooner. Performed at [facility], [place] [address] [place], [place] [address]     Radiology Reports CT Angio Chest PE W and/or Wo Contrast  Result Date: 04/20/2019 CLINICAL DATA:  Shortness of breath. EXAM: CT ANGIOGRAPHY CHEST WITH CONTRAST TECHNIQUE: Multidetector CT imaging of the chest was performed using the standard protocol during bolus administration of intravenous contrast. Multiplanar CT image reconstructions and MIPs were obtained to evaluate the vascular anatomy. CONTRAST:  185mL OMNIPAQUE IOHEXOL 350 MG/ML SOLN COMPARISON:  None. FINDINGS: Cardiovascular: Satisfactory opacification of the pulmonary arteries to the segmental level. No evidence of pulmonary embolism. Normal heart size. No pericardial effusion. Atherosclerosis of thoracic aorta is noted without aneurysm or dissection. Mediastinum/Nodes: Thyroid gland and esophagus are unremarkable. Enlarged pre-vascular a subcarinal adenopathy is noted which most likely is inflammatory in etiology. Lungs/Pleura:  No pneumothorax is noted. Minimal bilateral pleural effusions are noted. Multiple airspace opacities are noted throughout both lungs most consistent with multifocal pneumonia, most likely of viral etiology. Upper Abdomen: No acute abnormality. Musculoskeletal: No chest wall abnormality. No acute or significant osseous findings. Review of the MIP images confirms the above findings. IMPRESSION: 1. No definite evidence of pulmonary embolus. 2. Multiple airspace opacities are noted throughout both lungs most consistent with multifocal pneumonia, most likely of viral etiology. 3. Minimal bilateral pleural effusions are noted. 4. Enlarged mediastinal adenopathy is noted which most likely is inflammatory in etiology. Aortic Atherosclerosis (ICD10-I70.0). Electronically Signed   By: Marijo Conception M.D.   On: 04/20/2019 10:54   US Venous Img Lower Unilateral Right  Result Date: 04/20/2019 CLINICAL DATA:  Swelling x5 days EXAM: RIGHT LOWER EXTREMITY VENOUS DOPPLER ULTRASOUND TECHNIQUE: Gray-scale sonography with compression, as well as color and duplex ultrasound, were performed to evaluate the deep venous system(s) from the level of the common femoral vein through the popliteal and proximal calf veins. COMPARISON:  None. FINDINGS: VENOUS Normal compressibility of the common femoral, superficial femoral, and popliteal veins, as well as the visualized calf veins. Visualized portions of profunda femoral vein and great saphenous vein unremarkable. No filling defects to suggest DVT on grayscale or color Doppler imaging. Doppler waveforms show normal direction of venous flow, normal respiratory phasicity and response to augmentation. Limited views of the contralateral common femoral vein are unremarkable. OTHER Subcutaneous calf edema. Limitations: none IMPRESSION: No femoropopliteal DVT nor evidence of DVT within the visualized calf veins. If clinical symptoms are inconsistent or if there are persistent or worsening  symptoms, further imaging (possibly involving the iliac veins) may be warranted. Electronically Signed   By: Lucrezia Europe M.D.   On: 04/20/2019 10:33   DG Chest Port 1 View  Result Date: 04/23/2019 CLINICAL DATA:  Short of breath for 1 week, tobacco abuse EXAM: PORTABLE CHEST 1 VIEW COMPARISON:  04/20/2019 at 8:53 a.m. FINDINGS: Single frontal view of the chest demonstrates a stable cardiac silhouette. Persistent multifocal bilateral ground-glass airspace disease unchanged. No effusion or pneumothorax. No acute bony abnormality. IMPRESSION: 1. Stable multifocal bilateral ground-glass airspace disease most consistent with atypical viral pneumonia such as COVID-19. Electronically Signed   By: Randa Ngo M.D.   On: 04/23/2019 21:44  DG Chest Portable 1 View  Result Date: 04/20/2019 CLINICAL DATA:  Shortness of breath, history coronary artery disease, hypertension, type II diabetes mellitus, COPD, smoker EXAM: PORTABLE CHEST 1 VIEW COMPARISON:  Portable exam 0853 hours compared to 01/28/2019 FINDINGS: Lordotic positioning. Enlargement of cardiac silhouette. Mediastinal contours normal. Patchy BILATERAL pulmonary infiltrates consistent with multifocal pneumonia. No pleural effusion or pneumothorax. No acute osseous findings. IMPRESSION: Patchy BILATERAL pulmonary infiltrates consistent with multifocal pneumonia; question of COVID-19 pneumonia is raised. Electronically Signed   By: Lavonia Dana M.D.   On: 04/20/2019 09:00   ECHOCARDIOGRAM COMPLETE  Result Date: 04/20/2019    ECHOCARDIOGRAM REPORT   Patient Name:   Peter Lambert Date of Exam: 04/20/2019 Medical Rec #:  [id number]       Height:       67.0 in Accession #:    [id number]      Weight:       198.4 lb Date of Birth:  11/14/1945      BSA:          2.015 m Patient Age:    25 years        BP:           119/65 mmHg Patient Gender: M               HR:           104 bpm. Exam Location:  [facility] Procedure: 2D Echo Indications:    Dyspnea 786.09 /  R06.00  History:        Patient has prior history of Echocardiogram examinations, most                 recent 07/03/2016. CAD, COPD; Risk Factors:Diabetes, Dyslipidemia                 and Hypertension. Acute respiratory failure with hypoxia ,                 Peripheral arterial disease.  Sonographer:    Leavy Cella RDCS (AE) Referring Phys: [place]  1. Left ventricular ejection fraction, by estimation, is 50 to 55%. The left ventricle has low normal function. The left ventricle has no regional wall motion abnormalities. Indeterminate diastolic filling due to E-A fusion. Elevated left ventricular end-diastolic pressure.  2. Right ventricular systolic function is mildly reduced. The right ventricular size is normal. There is moderately elevated pulmonary artery systolic pressure.  3. Left atrial size was moderately dilated.  4. The mitral valve is grossly normal. Mild mitral valve regurgitation.  5. The aortic valve is tricuspid. Aortic valve regurgitation is not visualized. No aortic stenosis is present.  6. The inferior vena cava is dilated in size with >50% respiratory variability, suggesting right atrial pressure of 8 mmHg. FINDINGS  Left Ventricle: Left ventricular ejection fraction, by estimation, is 50 to 55%. The left ventricle has low normal function. The left ventricle has no regional wall motion abnormalities. The left ventricular internal cavity size was normal in size. There is no left ventricular hypertrophy. Indeterminate diastolic filling due to E-A fusion. Elevated left ventricular end-diastolic pressure. Right Ventricle: The right ventricular size is normal. No increase in right ventricular wall thickness. Right ventricular systolic function is mildly reduced. There is moderately elevated pulmonary artery systolic pressure. The tricuspid regurgitant velocity is 3.30 m/s, and with an assumed right atrial pressure of 10 mmHg, the estimated right ventricular systolic pressure  is [id number] mmHg. Left Atrium: Left atrial size was moderately  dilated. Right Atrium: Right atrial size was normal in size. Pericardium: There is no evidence of pericardial effusion. Mitral Valve: The mitral valve is grossly normal. Mild mitral valve regurgitation. Tricuspid Valve: The tricuspid valve is grossly normal. Tricuspid valve regurgitation is mild. Aortic Valve: The aortic valve is tricuspid. . There is mild thickening of the aortic valve. Aortic valve regurgitation is not visualized. No aortic stenosis is present. There is mild thickening of the aortic valve. Pulmonic Valve: The pulmonic valve was grossly normal. Pulmonic valve regurgitation is not visualized. Aorta: The aortic root is normal in size and structure. Venous: The inferior vena cava is dilated in size with greater than 50% respiratory variability, suggesting right atrial pressure of 8 mmHg. IAS/Shunts: No atrial level shunt detected by color flow Doppler.  LEFT VENTRICLE PLAX 2D LVIDd:         5.04 cm  Diastology LVIDs:         4.45 cm  LV e' lateral:   8.16 cm/s LV PW:         1.28 cm  LV E/e' lateral: 10.7 LV IVS:        0.86 cm  LV e' medial:    4.03 cm/s LVOT diam:     2.10 cm  LV E/e' medial:  21.7 LVOT Area:     3.46 cm  RIGHT VENTRICLE RV S prime:     6.31 cm/s TAPSE (M-mode): 1.1 cm LEFT ATRIUM             Index       RIGHT ATRIUM           Index LA diam:        5.20 cm 2.58 cm/m  RA Area:     22.20 cm LA Vol (A2C):   68.3 ml 33.89 ml/m RA Volume:   78.40 ml  38.90 ml/m LA Vol (A4C):   80.0 ml 39.69 ml/m LA Biplane Vol: 80.7 ml 40.04 ml/m   AORTA Ao Root diam: 3.20 cm MITRAL VALVE               TRICUSPID VALVE MV Area (PHT): 4.60 cm    TR Peak grad:   43.6 mmHg MV Decel Time: 165 msec    TR Vmax:        330.00 cm/s MR Peak grad: 60.2 mmHg MR Mean grad: 42.0 mmHg    SHUNTS MR Vmax:      388.00 cm/s  Systemic Diam: 2.10 cm MR Vmean:     307.0 cm/s MV E velocity: 87.30 cm/s MV A velocity: 39.80 cm/s MV E/A ratio:  2.19 Kate Sable MD Electronically signed by Kate Sable MD Signature Date/Time: 04/20/2019/3:05:50 PM    Final      CBC Recent Labs  Lab 04/20/19 [id number] 04/20/19 [id number] 04/20/19 1206 04/21/19 0401 04/22/19 0426 04/23/19 2037 04/24/19 0151  WBC 8.7   < > 10.0 11.0* 17.7* 15.3* 14.2*  HGB 9.6*   < > 9.3* 8.9* 9.1* 9.7* 9.2*  HCT 31.6*   < > 29.7* 28.9* 29.4* 31.6* 29.6*  PLT 265   < > 251 266 342 372 353  MCV 89.8   < > 88.7 89.8 87.2 88.3 87.8  MCH 27.3   < > 27.8 27.6 27.0 27.1 27.3  MCHC 30.4   < > 31.3 30.8 31.0 30.7 31.1  RDW 15.8*   < > 15.7* 15.9* 15.9* 15.9* 15.9*  LYMPHSABS 0.9  --   --   --   --  0.5*  --   MONOABS 0.7  --   --   --   --  0.8  --   EOSABS 0.4  --   --   --   --  0.0  --   BASOSABS 0.1  --   --   --   --  0.0  --    < > = values in this interval not displayed.    Chemistries  Recent Labs  Lab 04/20/19 0846 04/20/19 0846 04/20/19 1206 04/20/19 1455 04/21/19 0401 04/22/19 0426 04/22/19 0841 04/23/19 0544 04/23/19 2037 04/24/19 0151  NA 138   < >  --   --  138 136  --  136 136 138  K 3.7   < >  --   --  4.6 4.2  --  4.1 4.3 4.2  CL 107   < >  --   --  107 105  --  103 102 106  CO2 21*   < >  --   --  20* 21*  --  21* 21* 22  GLUCOSE 153*   < >  --   --  231* 219*  --  224* 269* 181*  BUN 16   < >  --   --  25* 36*  --  41* 45* 45*  CREATININE 1.21   < >   < >  --  1.48* 1.54*  --  1.54* 1.82* 1.58*  CALCIUM 8.3*   < >  --   --  8.5* 8.7*  --  8.6* 8.5* 8.4*  MG  --   --   --  1.8  --   --  2.2 2.2  --   --   AST 25  --   --   --   --   --   --   --  28  --   ALT 39  --   --   --   --   --   --   --  46*  --   ALKPHOS 98  --   --   --   --   --   --   --  85  --   BILITOT 0.7  --   --   --   --   --   --   --  0.6  --    < > = values in this interval not displayed.   ------------------------------------------------------------------------------------------------------------------ No results for input(s): CHOL, HDL, LDLCALC, TRIG, CHOLHDL,  LDLDIRECT in the last 72 hours.  Lab Results  Component Value Date   HGBA1C 7.6 (H) 04/20/2019   ------------------------------------------------------------------------------------------------------------------ No results for input(s): TSH, T4TOTAL, T3FREE, THYROIDAB in the last 72 hours.  Invalid input(s): FREET3 ------------------------------------------------------------------------------------------------------------------ No results for input(s): VITAMINB12, FOLATE, FERRITIN, TIBC, IRON, RETICCTPCT in the last 72 hours.  Coagulation profile Recent Labs  Lab 04/20/19 0846  INR 1.3*    No results for input(s): DDIMER in the last 72 hours.  Cardiac Enzymes No results for input(s): CKMB, TROPONINI, MYOGLOBIN in the last 168 hours.  Invalid input(s): CK ------------------------------------------------------------------------------------------------------------------    Component Value Date/Time   BNP 793.0 (H) 04/23/2019 2037     Roxan Hockey M.D on 04/24/2019 at 7:01 PM  Go to [email or web address] - for contact info  [facility] - Office  [phone]

## 2019-04-24 NOTE — Plan of Care (Signed)
  Problem: Acute Rehab PT Goals(only PT should resolve) Goal: Pt Will Transfer Bed To Chair/Chair To Bed Outcome: Progressing Flowsheets (Taken 04/24/2019 1346) Pt will Transfer Bed to Chair/Chair to Bed: with modified independence Goal: Pt Will Perform Standing Balance Or Pre-Gait Outcome: Progressing Flowsheets (Taken 04/24/2019 1346) Pt will perform standing balance or pre-gait: with Modified Independent Goal: Pt Will Ambulate Outcome: Progressing Flowsheets (Taken 04/24/2019 1346) Pt will Ambulate:  > 125 feet  with modified independence Goal: Pt/caregiver will Perform Home Exercise Program Outcome: Progressing Flowsheets (Taken 04/24/2019 1346) Pt/caregiver will Perform Home Exercise Program:  For increased strengthening  For improved balance  Independently  1:46 PM, 04/24/19 Mearl Latin PT, DPT Physical Therapist at [facility]

## 2019-04-24 NOTE — Care Management Important Message (Signed)
Important Message  Patient Details  Name: Peter Lambert MRN: [id number] Date of Birth: 07/27/1945   Medicare Important Message Given:  Yes     Tommy Medal 04/24/2019, 2:32 PM

## 2019-04-25 LAB — GLUCOSE, CAPILLARY
Glucose-Capillary: 244 mg/dL — ABNORMAL HIGH (ref 70–99)
Glucose-Capillary: 280 mg/dL — ABNORMAL HIGH (ref 70–99)
Glucose-Capillary: 278 mg/dL — ABNORMAL HIGH (ref 70–99)

## 2019-04-25 LAB — CULTURE, BLOOD (ROUTINE X 2)
Culture: NO GROWTH
Culture: NO GROWTH

## 2019-04-25 MED ORDER — INSULIN ASPART 100 UNIT/ML ~~LOC~~ SOLN
0.0000 [IU] | Freq: Three times a day (TID) | SUBCUTANEOUS | Status: DC
  Administered 2019-04-25: 5 [IU] via SUBCUTANEOUS
  Administered 2019-04-25: 3 [IU] via SUBCUTANEOUS
  Administered 2019-04-26: 7 [IU] via SUBCUTANEOUS
  Administered 2019-04-26: 5 [IU] via SUBCUTANEOUS
  Administered 2019-04-26: 3 [IU] via SUBCUTANEOUS

## 2019-04-25 MED ORDER — METOLAZONE 5 MG PO TABS
2.5000 mg | ORAL_TABLET | Freq: Once | ORAL | Status: AC
  Administered 2019-04-25: 2.5 mg via ORAL
  Filled 2019-04-25: qty 1

## 2019-04-25 MED ORDER — FUROSEMIDE 10 MG/ML IJ SOLN
20.0000 mg | Freq: Two times a day (BID) | INTRAMUSCULAR | Status: DC
  Administered 2019-04-25 – 2019-04-28 (×6): 20 mg via INTRAVENOUS
  Filled 2019-04-25 (×6): qty 2

## 2019-04-25 MED ORDER — INSULIN ASPART 100 UNIT/ML ~~LOC~~ SOLN
0.0000 [IU] | Freq: Every day | SUBCUTANEOUS | Status: DC
  Administered 2019-04-25: 3 [IU] via SUBCUTANEOUS

## 2019-04-25 NOTE — Care Management (Signed)
Received call from charge nurse that patient had a question about his oxygen tanks. Call to patient, patient left AMA in the afternoon on 2/25 with oxygen tanks supplied by [employer] for 4 liters.  Patient would like a Advertising copywriter. Educated patient that while he is on 4 liters, a portable tank will not be suitable for his needs.  Patient reports that he does not have his tanks with him in his room but he brought them with him when he came back to ER later that night of 2/25.  Call to ER, tanks located, asked for tanks to be delivered to patient's room in 316.   Discussed all with Juliann Pulse of [employer]. Patient still needs to have home fill station delivered to house. . Patient updated to answer calls from [employer] and let that equipment be delivered.  Patient understands that for now while on 4 liters he will have to use the larger tanks and can be considered for smaller portable tanks when his liter flow need decreases.

## 2019-04-25 NOTE — Progress Notes (Addendum)
Patient Demographics:    Peter Lambert, is a 74 y.o. male, DOB - 05-07-1945, [id number]  Admit date - 04/23/2019   Admitting Physician Lynetta Mare, MD  Outpatient Primary MD for the patient is Redmond School, MD  LOS - 2   Chief Complaint  Patient presents with  . Shortness of Breath        Subjective:    [name] today has no fevers, no emesis,  No chest pain,   -Shortness of breath and dyspnea on exertion persist, -Voiding okay  Assessment  & Plan :    Principal Problem:   Acute respiratory failure with hypoxemia (HCC) Active Problems:   Coronary artery disease   Essential hypertension   Hyperlipidemia   Tobacco use   COPD exacerbation (HCC)   Multifocal pneumonia   Atrial flutter (HCC)   Acute on chronic respiratory failure with hypoxia [facility])  Brief Summary:- 74 y.o. male with medical history significant of tobacco abuse (quit 02/2019), HTN, HLD, H/o CAD, prior MI,  DM2 and GERD admitted on 04/23/2019 with acute hypoxemia, respiratory failure, pneumonia, CHF exacerbation, COPD   A/p  1) acute hypoxic respiratory failure--- secondary to combination of CHF, pneumonia and COPD exacerbation--- --patient was initially seen in the ED on 04/20/2019, and sent home on home O2 after refusing admission to the hospital/leaving AMA -Readmitted 04/23/2019 as above for same - currently requiring 4 to 5 L of oxygen via nasal cannula  2) acute COPD exacerbation--- continue IV Solu-Medrol, mucolytics, bronchodilators, antibiotics as below #4 as prescribed -Patient quit smoking in January 2021  3)HFpEF--Echo with EF is 50 to [id number], , acute diastolic CHF exacerbation,  -Add po metolazone, continue IV Lasix, daily weight and fluid monitoring -Fluid balance negative over 1 L in the last 24 hours  4) community-acquired pneumonia--- hypoxia persist, cough and shortness of breath improving,    -continue IV Rocephin / azithromycin as ordered, mucolytics bronchodilators as ordered  5)H/o CAD--stable, continue aspirin, Plavix and Lipitor -Metoprolol 12.5 mg twice daily as ordered  6) generalized weakness and deconditioning----PT eval appreciated recommends home health therapy  Disposition/Need for in-Hospital Stay- patient unable to be discharged at this time due to --CHF exacerbation requiring IV Lasix, community-acquired pneumonia requiring IV antibiotics, acute hypoxic respiratory failure requiring oxygen supplementation -Possible discharge home with home health but not medically ready at this time*  Code Status : Full  Family Communication:   NA (patient is alert, awake and coherent)  Consults  :  na  DVT Prophylaxis  : Heparin - SCDs   Lab Results  Component Value Date   PLT 353 04/24/2019   Inpatient Medications  Scheduled Meds: . aspirin  81 mg Oral Daily  . atorvastatin  20 mg Oral Daily  . clopidogrel  75 mg Oral Daily  . colchicine  0.6 mg Oral Daily  . ferrous sulfate  325 mg Oral TID with meals  . furosemide  20 mg Intravenous Q12H  . guaiFENesin  600 mg Oral BID  . heparin  5,000 Units Subcutaneous Q8H  . insulin aspart  0-5 Units Subcutaneous QHS  . insulin aspart  0-9 Units Subcutaneous TID WC  . ipratropium-albuterol  3 mL Nebulization 4x daily  . methylPREDNISolone (SOLU-MEDROL) injection  40 mg  Intravenous Q12H  . metoprolol tartrate  12.5 mg Oral BID  . potassium chloride SA  20 mEq Oral Daily  . sodium chloride flush  3 mL Intravenous Q12H   Continuous Infusions: . sodium chloride    . azithromycin 500 mg (04/25/19 1105)  . cefTRIAXone (ROCEPHIN)  IV 1 g (04/25/19 1019)   PRN Meds:.sodium chloride, acetaminophen **OR** acetaminophen, bisacodyl, diazepam, nitroGLYCERIN, ondansetron **OR** ondansetron (ZOFRAN) IV, polyethylene glycol, sodium chloride flush, traMADol    Anti-infectives (From admission, onward)   Start     Dose/Rate Route  Frequency Ordered Stop   04/24/19 1000  cefTRIAXone (ROCEPHIN) 1 g in sodium chloride 0.9 % 100 mL IVPB     1 g 200 mL/hr over 30 Minutes Intravenous Every 24 hours 04/23/19 2326     04/24/19 1000  azithromycin (ZITHROMAX) 500 mg in sodium chloride 0.9 % 250 mL IVPB     500 mg 250 mL/hr over 60 Minutes Intravenous Every 24 hours 04/23/19 2326          Objective:   Vitals:   04/25/19 0500 04/25/19 0838 04/25/19 1359 04/25/19 1508  BP: 126/74 (!) 143/86 114/70   Pulse: 84 91 84   Resp: 20  19   Temp: (!) 97.4 F (36.3 C)  99.1 F (37.3 C)   TempSrc: Oral  Oral   SpO2: 94%  96% (!) 85%  Weight:      Height:        Wt Readings from Last 3 Encounters:  04/24/19 82.1 kg  04/20/19 83.3 kg  02/27/18 89.4 kg     Intake/Output Summary (Last 24 hours) at 04/25/2019 1613 Last data filed at 04/25/2019 1422 Gross per 24 hour  Intake 1455.11 ml  Output 2520 ml  Net -1064.89 ml     Physical Exam  Gen:- Awake Alert, +ve DOE HEENT:- [place].AT, No sclera icterus Nose- [place] 4 L/min Neck-Supple Neck, +ve JVD,.  Lungs-diminished breath sounds, no wheezing  CV- S1, S2 normal, regular  Abd-  +ve B.Sounds, Abd Soft, No tenderness,    Extremity :-  2 to 3 +ve LE  edema, pedal pulses present Psych-affect is appropriate, oriented x3 Neuro-generalized weakness, no new focal deficits, no tremors Skin-- Lt lateral leg area with open wound with serous fluid   Data Review:   Micro Results Recent Results (from the past 240 hour(s))  Respiratory Panel by RT PCR (Flu A&B, Covid) - Nasopharyngeal Swab     Status: None   Collection Time: 04/20/19  9:28 AM   Specimen: Nasopharyngeal Swab  Result Value Ref Range Status   SARS Coronavirus 2 by RT PCR NEGATIVE NEGATIVE Final    Comment: (NOTE) SARS-CoV-2 target nucleic acids are NOT DETECTED. The SARS-CoV-2 RNA is generally detectable in upper respiratoy specimens during the acute phase of infection. The lowest concentration of SARS-CoV-2 viral  copies this assay can detect is 131 copies/mL. A negative result does not preclude SARS-Cov-2 infection and should not be used as the sole basis for treatment or other patient management decisions. A negative result may occur with  improper specimen collection/handling, submission of specimen other than nasopharyngeal swab, presence of viral mutation(s) within the areas targeted by this assay, and inadequate number of viral copies (<131 copies/mL). A negative result must be combined with clinical observations, patient history, and epidemiological information. The expected result is Negative. Fact Sheet for Patients:  PinkCheek.be Fact Sheet for Healthcare Providers:  GravelBags.it This test is not yet ap proved or cleared by the [place]  States FDA and  has been authorized for detection and/or diagnosis of SARS-CoV-2 by FDA under an Emergency Use Authorization (EUA). This EUA will remain  in effect (meaning this test can be used) for the duration of the COVID-19 declaration under Section 564(b)(1) of the Act, 21 U.S.C. section 360bbb-3(b)(1), unless the authorization is terminated or revoked sooner.    Influenza A by PCR NEGATIVE NEGATIVE Final   Influenza B by PCR NEGATIVE NEGATIVE Final    Comment: (NOTE) The Xpert Xpress SARS-CoV-2/FLU/RSV assay is intended as an aid in  the diagnosis of influenza from Nasopharyngeal swab specimens and  should not be used as a sole basis for treatment. Nasal washings and  aspirates are unacceptable for Xpert Xpress SARS-CoV-2/FLU/RSV  testing. Fact Sheet for Patients: PinkCheek.be Fact Sheet for Healthcare Providers: GravelBags.it This test is not yet approved or cleared by the [place] FDA and  has been authorized for detection and/or diagnosis of SARS-CoV-2 by  FDA under an Emergency Use Authorization (EUA). This EUA will  remain  in effect (meaning this test can be used) for the duration of the  Covid-19 declaration under Section 564(b)(1) of the Act, 21  U.S.C. section 360bbb-3(b)(1), unless the authorization is  terminated or revoked. Performed at [facility], [address], [place], [place] [address]   Blood culture (routine x 2)     Status: None   Collection Time: 04/20/19 10:57 AM   Specimen: BLOOD LEFT ARM  Result Value Ref Range Status   Specimen Description BLOOD LEFT ARM  Final   Special Requests   Final    BOTTLES DRAWN AEROBIC AND ANAEROBIC Blood Culture results may not be optimal due to an inadequate volume of blood received in culture bottles   Culture   Final    NO GROWTH 5 DAYS Performed at [facility], [address], [place], [place] [address]    Report Status 04/25/2019 FINAL  Final  Blood culture (routine x 2)     Status: None   Collection Time: 04/20/19 11:02 AM   Specimen: BLOOD LEFT WRIST  Result Value Ref Range Status   Specimen Description BLOOD LEFT WRIST  Final   Special Requests   Final    BOTTLES DRAWN AEROBIC AND ANAEROBIC Blood Culture results may not be optimal due to an inadequate volume of blood received in culture bottles   Culture   Final    NO GROWTH 5 DAYS Performed at [facility], [address], [place], [place] [address]    Report Status 04/25/2019 FINAL  Final  MRSA PCR Screening     Status: None   Collection Time: 04/20/19  3:43 PM   Specimen: Nasal Mucosa; Nasopharyngeal  Result Value Ref Range Status   MRSA by PCR NEGATIVE NEGATIVE Final    Comment:        The GeneXpert MRSA Assay (FDA approved for NASAL specimens only), is one component of a comprehensive MRSA colonization surveillance program. It is not intended to diagnose MRSA infection nor to guide or monitor treatment for MRSA infections. Performed at [facility], [address], [place], [place] [address]   SARS CORONAVIRUS 2 (TAT 6-24 HRS) Nasopharyngeal Nasopharyngeal Swab      Status: None   Collection Time: 04/23/19 11:12 PM   Specimen: Nasopharyngeal Swab  Result Value Ref Range Status   SARS Coronavirus 2 NEGATIVE NEGATIVE Final    Comment: (NOTE) SARS-CoV-2 target nucleic acids are NOT DETECTED. The SARS-CoV-2 RNA is generally detectable in upper and lower  respiratory specimens during the acute phase of infection. Negative results do not preclude SARS-CoV-2 infection, do not rule out co-infections with other pathogens, and should not be used as the sole basis for treatment or other patient management decisions. Negative results must be combined with clinical observations, patient history, and epidemiological information. The expected result is Negative. Fact Sheet for Patients: SugarRoll.be Fact Sheet for Healthcare Providers: https://www.woods-mathews.com/ This test is not yet approved or cleared by the [place] FDA and  has been authorized for detection and/or diagnosis of SARS-CoV-2 by FDA under an Emergency Use Authorization (EUA). This EUA will remain  in effect (meaning this test can be used) for the duration of the COVID-19 declaration under Section 56 4(b)(1) of the Act, 21 U.S.C. section 360bbb-3(b)(1), unless the authorization is terminated or revoked sooner. Performed at [facility], [place] [address], [place], [place] [address]     Radiology Reports CT Angio Chest PE W and/or Wo Contrast  Result Date: 04/20/2019 CLINICAL DATA:  Shortness of breath. EXAM: CT ANGIOGRAPHY CHEST WITH CONTRAST TECHNIQUE: Multidetector CT imaging of the chest was performed using the standard protocol during bolus administration of intravenous contrast. Multiplanar CT image reconstructions and MIPs were obtained to evaluate the vascular anatomy. CONTRAST:  156mL OMNIPAQUE IOHEXOL 350 MG/ML SOLN COMPARISON:  None. FINDINGS: Cardiovascular: Satisfactory opacification of the pulmonary arteries to the segmental level.  No evidence of pulmonary embolism. Normal heart size. No pericardial effusion. Atherosclerosis of thoracic aorta is noted without aneurysm or dissection. Mediastinum/Nodes: Thyroid gland and esophagus are unremarkable. Enlarged pre-vascular a subcarinal adenopathy is noted which most likely is inflammatory in etiology. Lungs/Pleura: No pneumothorax is noted. Minimal bilateral pleural effusions are noted. Multiple airspace opacities are noted throughout both lungs most consistent with multifocal pneumonia, most likely of viral etiology. Upper Abdomen: No acute abnormality. Musculoskeletal: No chest wall abnormality. No acute or significant osseous findings. Review of the MIP images confirms the above findings. IMPRESSION: 1. No definite evidence of pulmonary embolus. 2. Multiple airspace opacities are noted throughout both lungs most consistent with multifocal pneumonia, most likely of viral etiology. 3. Minimal bilateral pleural effusions are noted. 4. Enlarged mediastinal adenopathy is noted which most likely is inflammatory in etiology. Aortic Atherosclerosis (ICD10-I70.0). Electronically Signed   By: Marijo Conception M.D.   On: 04/20/2019 10:54   US Venous Img Lower Unilateral Right  Result Date: 04/20/2019 CLINICAL DATA:  Swelling x5 days EXAM: RIGHT LOWER EXTREMITY VENOUS DOPPLER ULTRASOUND TECHNIQUE: Gray-scale sonography with compression, as well as color and duplex ultrasound, were performed to evaluate the deep venous system(s) from the level of the common femoral vein through the popliteal and proximal calf veins. COMPARISON:  None. FINDINGS: VENOUS Normal compressibility of the common femoral, superficial femoral, and popliteal veins, as well as the visualized calf veins. Visualized portions of profunda femoral vein and great saphenous vein unremarkable. No filling defects to suggest DVT on grayscale or color Doppler imaging. Doppler waveforms show normal direction of venous flow, normal respiratory  phasicity and response to augmentation. Limited views of the contralateral common femoral vein are unremarkable. OTHER Subcutaneous calf edema. Limitations: none IMPRESSION: No femoropopliteal DVT nor evidence of DVT within the visualized calf veins. If clinical symptoms are inconsistent or if there are persistent or worsening symptoms, further imaging (possibly involving the iliac veins) may be warranted. Electronically Signed   By: Lucrezia Europe M.D.   On: 04/20/2019 10:33   DG Chest Port 1 View  Result Date: 04/23/2019 CLINICAL DATA:  Short of breath for 1 week, tobacco abuse EXAM: PORTABLE CHEST 1 VIEW COMPARISON:  04/20/2019 at 8:53 a.m. FINDINGS: Single frontal view of the chest demonstrates a stable cardiac silhouette. Persistent multifocal bilateral ground-glass airspace disease unchanged. No effusion or pneumothorax. No acute bony abnormality. IMPRESSION: 1. Stable multifocal bilateral ground-glass airspace disease most consistent with atypical viral pneumonia such as COVID-19. Electronically Signed   By: Randa Ngo M.D.   On: 04/23/2019 21:44   DG Chest Portable 1 View  Result Date: 04/20/2019 CLINICAL DATA:  Shortness of breath, history coronary artery disease, hypertension, type II diabetes mellitus, COPD, smoker EXAM: PORTABLE CHEST 1 VIEW COMPARISON:  Portable exam 0853 hours compared to 01/28/2019 FINDINGS: Lordotic positioning. Enlargement of cardiac silhouette. Mediastinal contours normal. Patchy BILATERAL pulmonary infiltrates consistent with multifocal pneumonia. No pleural effusion or pneumothorax. No acute osseous findings. IMPRESSION: Patchy BILATERAL pulmonary infiltrates consistent with multifocal pneumonia; question of COVID-19 pneumonia is raised. Electronically Signed   By: Lavonia Dana M.D.   On: 04/20/2019 09:00   ECHOCARDIOGRAM COMPLETE  Result Date: 04/20/2019    ECHOCARDIOGRAM REPORT   Patient Name:   HEMINGWAY MCRILL Date of Exam: 04/20/2019 Medical Rec #:  [id number]        Height:       67.0 in Accession #:    [id number]      Weight:       198.4 lb Date of Birth:  [birth year]      BSA:          2.015 m Patient Age:    32 years        BP:           119/65 mmHg Patient Gender: M               HR:           104 bpm. Exam Location:  [facility] Procedure: 2D Echo Indications:    Dyspnea 786.09 / R06.00  History:        Patient has prior history of Echocardiogram examinations, most                 recent 07/03/2016. CAD, COPD; Risk Factors:Diabetes, Dyslipidemia                 and Hypertension. Acute respiratory failure with hypoxia ,                 Peripheral arterial disease.  Sonographer:    Leavy Cella RDCS (AE) Referring Phys: [place]  1. Left ventricular ejection fraction, by estimation, is 50 to 55%. The left ventricle has low normal function. The left ventricle has no regional wall motion abnormalities. Indeterminate diastolic filling due to E-A fusion. Elevated left ventricular end-diastolic pressure.  2. Right ventricular systolic function is mildly reduced. The right ventricular size is normal. There is moderately elevated pulmonary artery systolic pressure.  3. Left atrial size was moderately dilated.  4. The mitral valve is grossly normal. Mild mitral valve regurgitation.  5. The aortic valve is tricuspid. Aortic valve regurgitation is not visualized. No aortic stenosis is present.  6. The inferior vena cava is dilated in size with >50% respiratory variability, suggesting right atrial pressure of 8 mmHg. FINDINGS  Left Ventricle: Left ventricular ejection fraction, by estimation, is 50 to 55%. The left ventricle has low normal function. The left ventricle has no regional wall motion abnormalities. The left ventricular internal cavity size was normal in size. There is no  left ventricular hypertrophy. Indeterminate diastolic filling due to E-A fusion. Elevated left ventricular end-diastolic pressure. Right Ventricle: The right ventricular size is  normal. No increase in right ventricular wall thickness. Right ventricular systolic function is mildly reduced. There is moderately elevated pulmonary artery systolic pressure. The tricuspid regurgitant velocity is 3.30 m/s, and with an assumed right atrial pressure of 10 mmHg, the estimated right ventricular systolic pressure is [id number] mmHg. Left Atrium: Left atrial size was moderately dilated. Right Atrium: Right atrial size was normal in size. Pericardium: There is no evidence of pericardial effusion. Mitral Valve: The mitral valve is grossly normal. Mild mitral valve regurgitation. Tricuspid Valve: The tricuspid valve is grossly normal. Tricuspid valve regurgitation is mild. Aortic Valve: The aortic valve is tricuspid. . There is mild thickening of the aortic valve. Aortic valve regurgitation is not visualized. No aortic stenosis is present. There is mild thickening of the aortic valve. Pulmonic Valve: The pulmonic valve was grossly normal. Pulmonic valve regurgitation is not visualized. Aorta: The aortic root is normal in size and structure. Venous: The inferior vena cava is dilated in size with greater than 50% respiratory variability, suggesting right atrial pressure of 8 mmHg. IAS/Shunts: No atrial level shunt detected by color flow Doppler.  LEFT VENTRICLE PLAX 2D LVIDd:         5.04 cm  Diastology LVIDs:         4.45 cm  LV e' lateral:   8.16 cm/s LV PW:         1.28 cm  LV E/e' lateral: 10.7 LV IVS:        0.86 cm  LV e' medial:    4.03 cm/s LVOT diam:     2.10 cm  LV E/e' medial:  21.7 LVOT Area:     3.46 cm  RIGHT VENTRICLE RV S prime:     6.31 cm/s TAPSE (M-mode): 1.1 cm LEFT ATRIUM             Index       RIGHT ATRIUM           Index LA diam:        5.20 cm 2.58 cm/m  RA Area:     22.20 cm LA Vol (A2C):   68.3 ml 33.89 ml/m RA Volume:   78.40 ml  38.90 ml/m LA Vol (A4C):   80.0 ml 39.69 ml/m LA Biplane Vol: 80.7 ml 40.04 ml/m   AORTA Ao Root diam: 3.20 cm MITRAL VALVE               TRICUSPID  VALVE MV Area (PHT): 4.60 cm    TR Peak grad:   43.6 mmHg MV Decel Time: 165 msec    TR Vmax:        330.00 cm/s MR Peak grad: 60.2 mmHg MR Mean grad: 42.0 mmHg    SHUNTS MR Vmax:      388.00 cm/s  Systemic Diam: 2.10 cm MR Vmean:     307.0 cm/s MV E velocity: 87.30 cm/s MV A velocity: 39.80 cm/s MV E/A ratio:  2.19 Kate Sable MD Electronically signed by Kate Sable MD Signature Date/Time: 04/20/2019/3:05:50 PM    Final      CBC Recent Labs  Lab 04/20/19 [id number] 04/20/19 [id number] 04/20/19 1206 04/21/19 0401 04/22/19 0426 04/23/19 2037 04/24/19 0151  WBC 8.7   < > 10.0 11.0* 17.7* 15.3* 14.2*  HGB 9.6*   < > 9.3* 8.9* 9.1* 9.7* 9.2*  HCT 31.6*   < > 29.7*  28.9* 29.4* 31.6* 29.6*  PLT 265   < > 251 266 342 372 353  MCV 89.8   < > 88.7 89.8 87.2 88.3 87.8  MCH 27.3   < > 27.8 27.6 27.0 27.1 27.3  MCHC 30.4   < > 31.3 30.8 31.0 30.7 31.1  RDW 15.8*   < > 15.7* 15.9* 15.9* 15.9* 15.9*  LYMPHSABS 0.9  --   --   --   --  0.5*  --   MONOABS 0.7  --   --   --   --  0.8  --   EOSABS 0.4  --   --   --   --  0.0  --   BASOSABS 0.1  --   --   --   --  0.0  --    < > = values in this interval not displayed.    Chemistries  Recent Labs  Lab 04/20/19 0846 04/20/19 0846 04/20/19 1206 04/20/19 1455 04/21/19 0401 04/22/19 0426 04/22/19 0841 04/23/19 0544 04/23/19 2037 04/24/19 0151  NA 138   < >  --   --  138 136  --  136 136 138  K 3.7   < >  --   --  4.6 4.2  --  4.1 4.3 4.2  CL 107   < >  --   --  107 105  --  103 102 106  CO2 21*   < >  --   --  20* 21*  --  21* 21* 22  GLUCOSE 153*   < >  --   --  231* 219*  --  224* 269* 181*  BUN 16   < >  --   --  25* 36*  --  41* 45* 45*  CREATININE 1.21   < >   < >  --  1.48* 1.54*  --  1.54* 1.82* 1.58*  CALCIUM 8.3*   < >  --   --  8.5* 8.7*  --  8.6* 8.5* 8.4*  MG  --   --   --  1.8  --   --  2.2 2.2  --   --   AST 25  --   --   --   --   --   --   --  28  --   ALT 39  --   --   --   --   --   --   --  46*  --   ALKPHOS 98  --   --    --   --   --   --   --  85  --   BILITOT 0.7  --   --   --   --   --   --   --  0.6  --    < > = values in this interval not displayed.   ------------------------------------------------------------------------------------------------------------------ No results for input(s): CHOL, HDL, LDLCALC, TRIG, CHOLHDL, LDLDIRECT in the last 72 hours.  Lab Results  Component Value Date   HGBA1C 7.6 (H) 04/20/2019   ------------------------------------------------------------------------------------------------------------------ No results for input(s): TSH, T4TOTAL, T3FREE, THYROIDAB in the last 72 hours.  Invalid input(s): FREET3 ------------------------------------------------------------------------------------------------------------------ No results for input(s): VITAMINB12, FOLATE, FERRITIN, TIBC, IRON, RETICCTPCT in the last 72 hours.  Coagulation profile Recent Labs  Lab 04/20/19 0846  INR 1.3*    No results for input(s): DDIMER in the last 72 hours.  Cardiac Enzymes No results for input(s): CKMB, TROPONINI,  MYOGLOBIN in the last 168 hours.  Invalid input(s): CK ------------------------------------------------------------------------------------------------------------------    Component Value Date/Time   BNP 793.0 (H) 04/23/2019 2037     Roxan Hockey M.D on 04/25/2019 at 4:13 PM  Go to [email or web address] - for contact info  [facility] - Office  [phone]

## 2019-04-25 NOTE — Progress Notes (Signed)
Patient sleeping in chair at this time. Denies chest pain or any distress. No distress noted. MD notified. Electrodes and telemetry monitor checked at the time.

## 2019-04-25 NOTE — Progress Notes (Signed)
   04/25/19 0823  Vitals  Cardiac Rhythm VT;Other (Comment) (5bts/RN Sandra Brents notified)   Patient up sitting in chair, watching television. Patient denies chest pain, denies shortness of breath. MD notified.

## 2019-04-26 ENCOUNTER — Inpatient Hospital Stay (HOSPITAL_COMMUNITY): Payer: PPO

## 2019-04-26 LAB — CBC
HCT: 32.9 % — ABNORMAL LOW (ref 39.0–52.0)
Hemoglobin: 10.3 g/dL — ABNORMAL LOW (ref 13.0–17.0)
MCH: 27.1 pg (ref 26.0–34.0)
MCHC: 31.3 g/dL (ref 30.0–36.0)
MCV: 86.6 fL (ref 80.0–100.0)
Platelets: 397 10*3/uL (ref 150–400)
RBC: 3.8 MIL/uL — ABNORMAL LOW (ref 4.22–5.81)
RDW: 16.1 % — ABNORMAL HIGH (ref 11.5–15.5)
WBC: 15.5 10*3/uL — ABNORMAL HIGH (ref 4.0–10.5)
nRBC: 0.6 % — ABNORMAL HIGH (ref 0.0–0.2)

## 2019-04-26 LAB — BASIC METABOLIC PANEL
Anion gap: 11 (ref 5–15)
BUN: 49 mg/dL — ABNORMAL HIGH (ref 8–23)
CO2: 24 mmol/L (ref 22–32)
Calcium: 8.6 mg/dL — ABNORMAL LOW (ref 8.9–10.3)
Chloride: 99 mmol/L (ref 98–111)
Creatinine, Ser: 1.21 mg/dL (ref 0.61–1.24)
GFR calc Af Amer: >60 mL/min (ref >60)
GFR calc non Af Amer: 58 mL/min — ABNORMAL LOW (ref >60)
Glucose, Bld: 227 mg/dL — ABNORMAL HIGH (ref 70–99)
Potassium: 4.3 mmol/L (ref 3.5–5.1)
Sodium: 134 mmol/L — ABNORMAL LOW (ref 135–145)

## 2019-04-26 LAB — GLUCOSE, CAPILLARY
Glucose-Capillary: 230 mg/dL — ABNORMAL HIGH (ref 70–99)
Glucose-Capillary: 336 mg/dL — ABNORMAL HIGH (ref 70–99)
Glucose-Capillary: 265 mg/dL — ABNORMAL HIGH (ref 70–99)
Glucose-Capillary: 381 mg/dL — ABNORMAL HIGH (ref 70–99)

## 2019-04-26 IMAGING — DX DG CHEST 2V
2 series · 2 of 2 positions shown · non-contrast
Comparison: [DATE]

CLINICAL DATA: Dyspnea

EXAM:
CHEST - 2 VIEW

[chest pa]
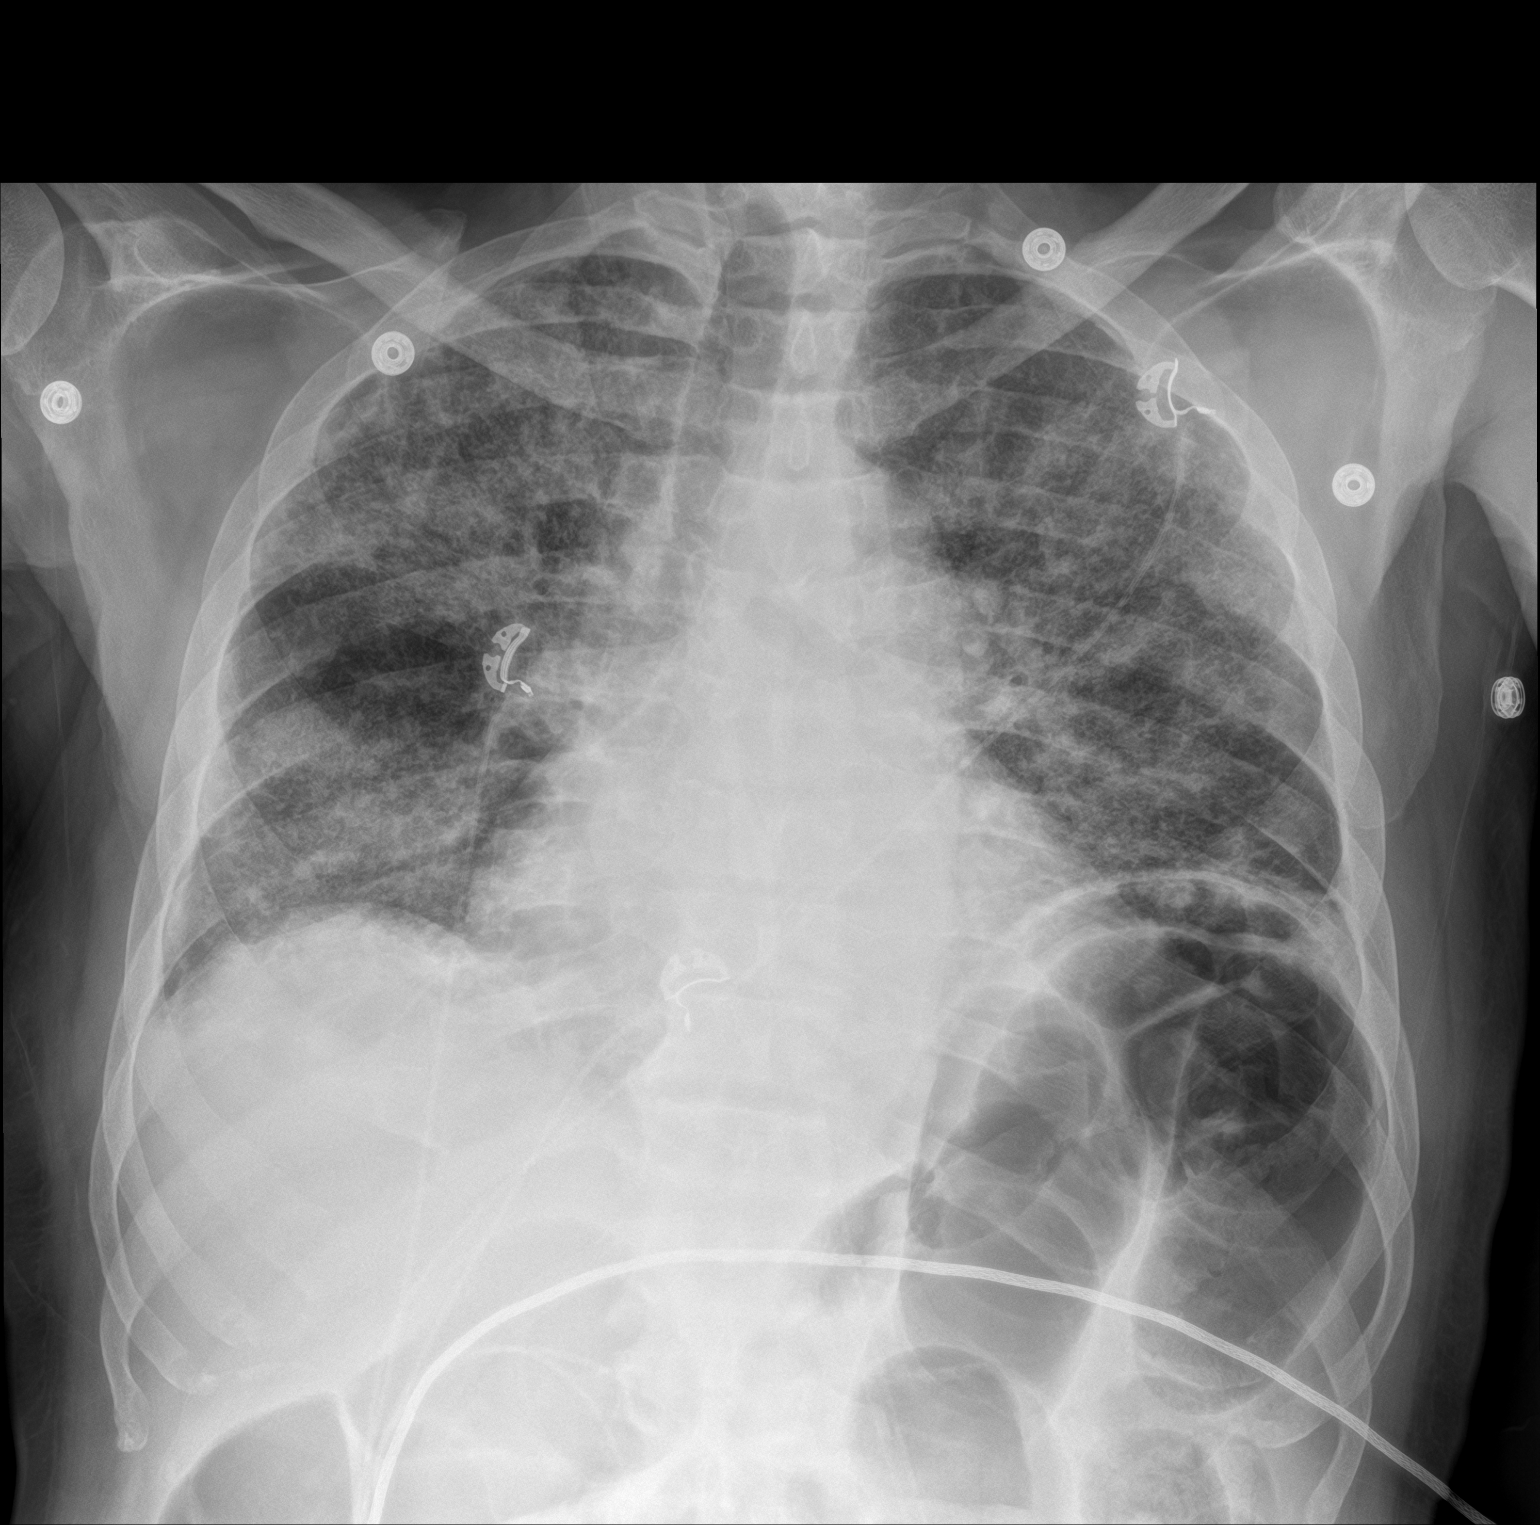

[chest lat]
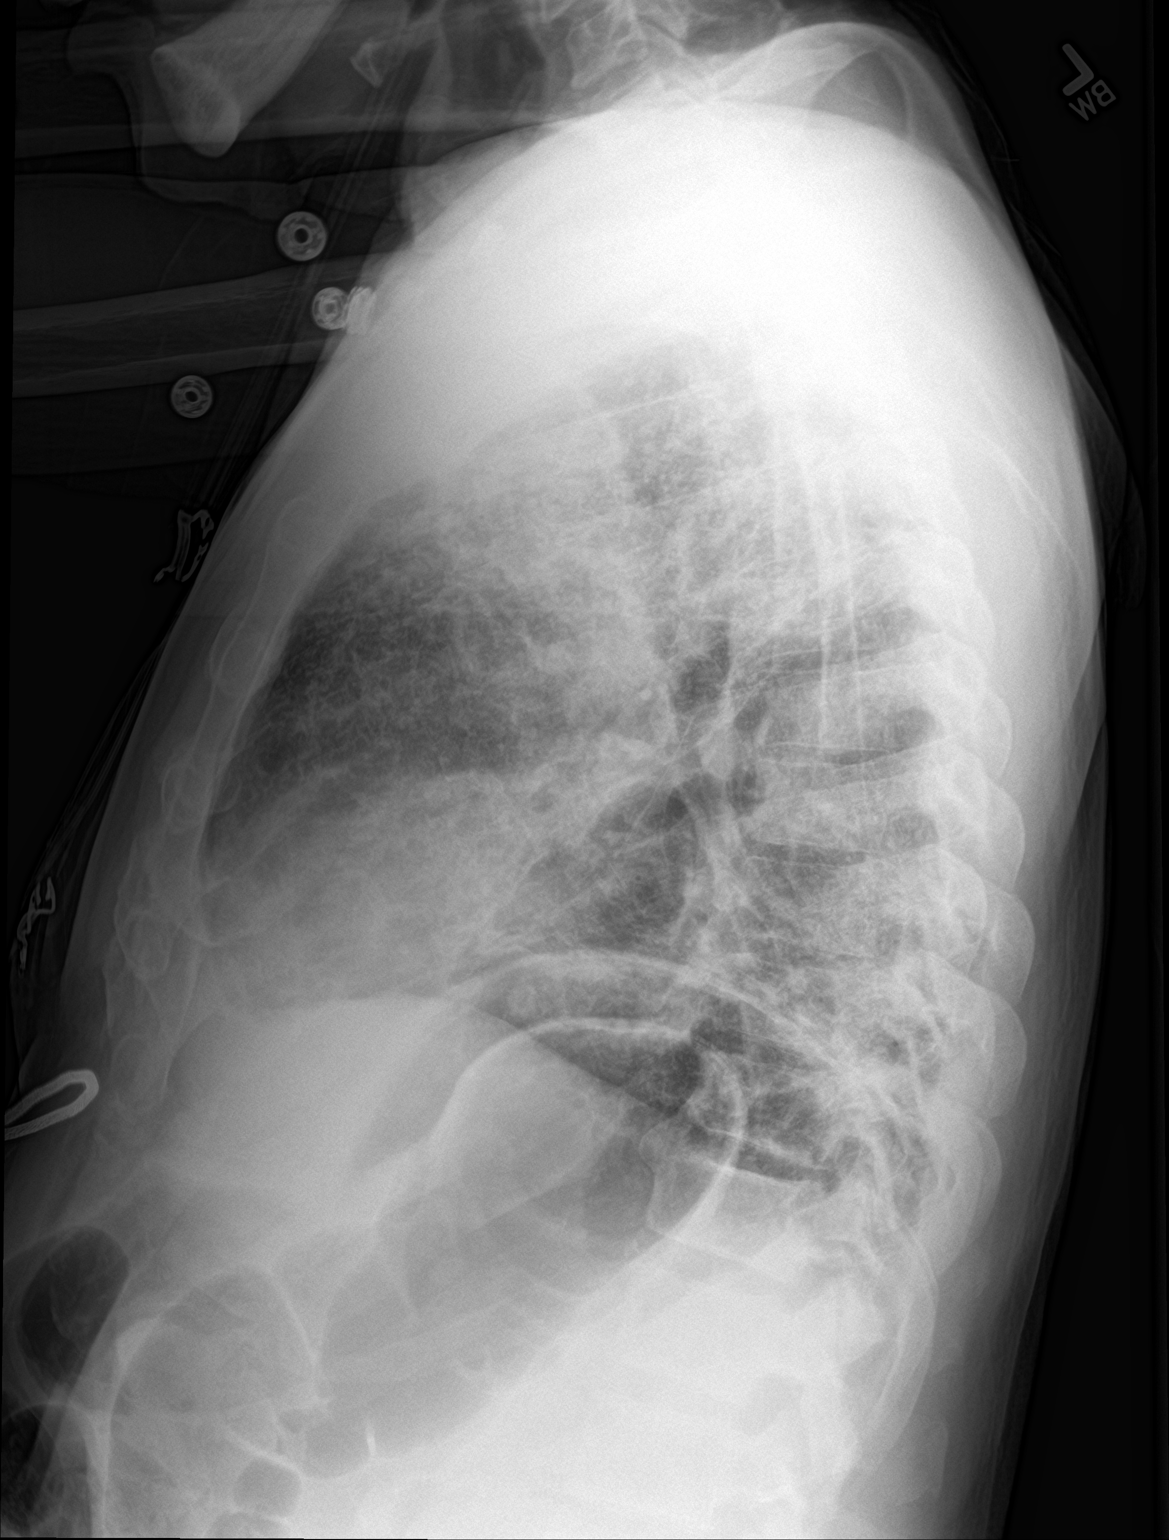

[2 of 2 positions shown; findings below may reference images not displayed]

FINDINGS: No significant change in diffuse bilateral interstitial pulmonary
opacity. No new or focal airspace opacity. The heart mediastinum are
unremarkable. Gas-filled bowel in the upper abdomen.
IMPRESSION: No significant change in diffuse bilateral interstitial pulmonary
opacity, consistent with edema or infection. No new or focal
airspace opacity.

## 2019-04-26 MED ORDER — INSULIN ASPART 100 UNIT/ML ~~LOC~~ SOLN
0.0000 [IU] | Freq: Three times a day (TID) | SUBCUTANEOUS | Status: DC
  Administered 2019-04-27: 11 [IU] via SUBCUTANEOUS
  Administered 2019-04-27: 15 [IU] via SUBCUTANEOUS
  Administered 2019-04-27: 4 [IU] via SUBCUTANEOUS
  Administered 2019-04-28: 08:00:00 7 [IU] via SUBCUTANEOUS
  Administered 2019-04-28: 15 [IU] via SUBCUTANEOUS

## 2019-04-26 MED ORDER — INSULIN ASPART 100 UNIT/ML ~~LOC~~ SOLN
0.0000 [IU] | Freq: Every day | SUBCUTANEOUS | Status: DC
  Administered 2019-04-26 – 2019-04-27 (×2): 10 [IU] via SUBCUTANEOUS

## 2019-04-26 MED ORDER — METOLAZONE 5 MG PO TABS
2.5000 mg | ORAL_TABLET | Freq: Once | ORAL | Status: AC
  Administered 2019-04-26: 2.5 mg via ORAL
  Filled 2019-04-26: qty 1

## 2019-04-26 NOTE — Progress Notes (Signed)
Patient Demographics:    Peter Lambert, is a 74 y.o. male, DOB - Jul 25, 1945, [id number]  Admit date - 04/23/2019   Admitting Physician Lynetta Mare, MD  Outpatient Primary MD for the patient is Redmond School, MD  LOS - 3   Chief Complaint  Patient presents with   Shortness of Breath        Subjective:    Peter Lambert today has no fevers, no emesis,  No chest pain,   DOE and orthopnea persist -slept in the chair overnight Slight cough  Assessment  & Plan :    Principal Problem:   Acute respiratory failure with hypoxemia [facility]) Active Problems:   Coronary artery disease   Essential hypertension   Hyperlipidemia   Tobacco use   COPD exacerbation ([place])   Multifocal pneumonia   Atrial flutter ([place])   Acute on chronic respiratory failure with hypoxia [facility])  Brief Summary:- 74 y.o. male with medical history significant of tobacco abuse (quit 02/2019), HTN, HLD, COPD , H/o CAD, prior MI,  DM2 and GERD admitted on 04/23/2019 with acute hypoxemia, respiratory failure, pneumonia with acute COPD Exacerbation, acute CHF exacerbation, COPD   A/p  1) acute hypoxic respiratory failure--- secondary to combination of CHF, pneumonia and COPD exacerbation--- --patient was initially seen in the ED on 04/20/2019, and sent home on home O2 after refusing admission to the hospital/leaving AMA -Readmitted 04/23/2019 as above for same - currently requiring  5 L of oxygen via nasal cannula  2) acute COPD exacerbation--- continue IV Solu-Medrol, mucolytics, bronchodilators, antibiotics as below #4 as prescribed -Patient quit smoking in January 2021  3)HFpEF--Echo with EF is 50 to [id number], , acute diastolic CHF exacerbation,  -Added po metolazone, continue IV Lasix, -Daily weight not available  -Fluid balance negative   4) community-acquired pneumonia--- hypoxia persist, cough and shortness of breath  improving,  -continue IV Rocephin / azithromycin as ordered, mucolytics bronchodilators as ordered  5)H/o CAD--stable, continue aspirin, Plavix and Lipitor -Metoprolol 12.5 mg twice daily as ordered  6) generalized weakness and deconditioning----PT eval appreciated recommends home health therapy  Disposition/Need for in-Hospital Stay- patient unable to be discharged at this time due to --CHF exacerbation requiring IV Lasix, community-acquired pneumonia requiring IV antibiotics, acute hypoxic respiratory failure requiring oxygen supplementation -Possible discharge home with home health but not medically ready at this time due to persistent dyspnea on exertion, orthopnea requiring oxygen at 5 L/min  Code Status : Full  Family Communication:   NA (patient is alert, awake and coherent)  Consults  :  na  DVT Prophylaxis  : Heparin - SCDs   Lab Results  Component Value Date   PLT 397 04/26/2019   Inpatient Medications  Scheduled Meds:  aspirin  81 mg Oral Daily   atorvastatin  20 mg Oral Daily   clopidogrel  75 mg Oral Daily   colchicine  0.6 mg Oral Daily   ferrous sulfate  325 mg Oral TID with meals   furosemide  20 mg Intravenous BID   guaiFENesin  600 mg Oral BID   heparin  5,000 Units Subcutaneous Q8H   insulin aspart  0-10 Units Subcutaneous QHS   [START ON 04/27/2019] insulin aspart  0-20 Units Subcutaneous TID WC   ipratropium-albuterol  3 mL Nebulization 4x daily   methylPREDNISolone (SOLU-MEDROL) injection  40 mg Intravenous Q12H   metoprolol tartrate  12.5 mg Oral BID   potassium chloride SA  20 mEq Oral Daily   sodium chloride flush  3 mL Intravenous Q12H   Continuous Infusions:  sodium chloride     azithromycin 500 mg (04/26/19 0859)   cefTRIAXone (ROCEPHIN)  IV 1 g (04/26/19 0826)   PRN Meds:.sodium chloride, acetaminophen **OR** acetaminophen, bisacodyl, diazepam, nitroGLYCERIN, ondansetron **OR** ondansetron (ZOFRAN) IV, polyethylene glycol,  sodium chloride flush, traMADol    Anti-infectives (From admission, onward)   Start     Dose/Rate Route Frequency Ordered Stop   04/24/19 1000  cefTRIAXone (ROCEPHIN) 1 g in sodium chloride 0.9 % 100 mL IVPB     1 g 200 mL/hr over 30 Minutes Intravenous Every 24 hours 04/23/19 2326     04/24/19 1000  azithromycin (ZITHROMAX) 500 mg in sodium chloride 0.9 % 250 mL IVPB     500 mg 250 mL/hr over 60 Minutes Intravenous Every 24 hours 04/23/19 2326          Objective:   Vitals:   04/26/19 0518 04/26/19 1257 04/26/19 1341 04/26/19 1630  BP: 125/77  (!) 115/59   Pulse: 86  85   Resp: 17  19   Temp: 98.4 F (36.9 C)  97.8 F (36.6 C)   TempSrc: Oral  Oral   SpO2: 92% 94% 94% 94%  Weight:      Height:        Wt Readings from Last 3 Encounters:  04/24/19 82.1 kg  04/20/19 83.3 kg  02/27/18 89.4 kg     Intake/Output Summary (Last 24 hours) at 04/26/2019 1714 Last data filed at 04/26/2019 1500 Gross per 24 hour  Intake 480 ml  Output 4025 ml  Net -3545 ml     Physical Exam  Gen:- Awake Alert, +ve DOE HEENT:- [place].AT, No sclera icterus Nose- [place] 5 L/min Neck-Supple Neck, +ve JVD,.  Lungs-diminished breath sounds, no wheezing  CV- S1, S2 normal, regular  Abd-  +ve B.Sounds, Abd Soft, No tenderness,    Extremity :-  2 to 3 +ve LE  edema, pedal pulses present Psych-affect is appropriate, oriented x3 Neuro-generalized weakness, no new focal deficits, no tremors Skin-- Lt lateral leg area with open wound with serous fluid   Data Review:   Micro Results Recent Results (from the past 240 hour(s))  Respiratory Panel by RT PCR (Flu A&B, Covid) - Nasopharyngeal Swab     Status: None   Collection Time: 04/20/19  9:28 AM   Specimen: Nasopharyngeal Swab  Result Value Ref Range Status   SARS Coronavirus 2 by RT PCR NEGATIVE NEGATIVE Final    Comment: (NOTE) SARS-CoV-2 target nucleic acids are NOT DETECTED. The SARS-CoV-2 RNA is generally detectable in upper  respiratoy specimens during the acute phase of infection. The lowest concentration of SARS-CoV-2 viral copies this assay can detect is 131 copies/mL. A negative result does not preclude SARS-Cov-2 infection and should not be used as the sole basis for treatment or other patient management decisions. A negative result may occur with  improper specimen collection/handling, submission of specimen other than nasopharyngeal swab, presence of viral mutation(s) within the areas targeted by this assay, and inadequate number of viral copies (<131 copies/mL). A negative result must be combined with clinical observations, patient history, and epidemiological information. The expected result is Negative. Fact Sheet for Patients:  PinkCheek.be Fact Sheet for Healthcare Providers:  GravelBags.it This  test is not yet ap proved or cleared by the [place] and  has been authorized for detection and/or diagnosis of SARS-CoV-2 by FDA under an Emergency Use Authorization (EUA). This EUA will remain  in effect (meaning this test can be used) for the duration of the COVID-19 declaration under Section 564(b)(1) of the Act, 21 U.S.C. section 360bbb-3(b)(1), unless the authorization is terminated or revoked sooner.    Influenza A by PCR NEGATIVE NEGATIVE Final   Influenza B by PCR NEGATIVE NEGATIVE Final    Comment: (NOTE) The Xpert Xpress SARS-CoV-2/FLU/RSV assay is intended as an aid in  the diagnosis of influenza from Nasopharyngeal swab specimens and  should not be used as a sole basis for treatment. Nasal washings and  aspirates are unacceptable for Xpert Xpress SARS-CoV-2/FLU/RSV  testing. Fact Sheet for Patients: PinkCheek.be Fact Sheet for Healthcare Providers: GravelBags.it This test is not yet approved or cleared by the [place] FDA and  has been authorized for  detection and/or diagnosis of SARS-CoV-2 by  FDA under an Emergency Use Authorization (EUA). This EUA will remain  in effect (meaning this test can be used) for the duration of the  Covid-19 declaration under Section 564(b)(1) of the Act, 21  U.S.C. section 360bbb-3(b)(1), unless the authorization is  terminated or revoked. Performed at [facility], [address] [place], [place] [address]   Blood culture (routine x 2)     Status: None   Collection Time: 04/20/19 10:57 AM   Specimen: BLOOD LEFT ARM  Result Value Ref Range Status   Specimen Description BLOOD LEFT ARM  Final   Special Requests   Final    BOTTLES DRAWN AEROBIC AND ANAEROBIC Blood Culture results may not be optimal due to an inadequate volume of blood received in culture bottles   Culture   Final    NO GROWTH 5 DAYS Performed at [facility], [address] [place], [place] [address]    Report Status 04/25/2019 FINAL  Final  Blood culture (routine x 2)     Status: None   Collection Time: 04/20/19 11:02 AM   Specimen: BLOOD LEFT WRIST  Result Value Ref Range Status   Specimen Description BLOOD LEFT WRIST  Final   Special Requests   Final    BOTTLES DRAWN AEROBIC AND ANAEROBIC Blood Culture results may not be optimal due to an inadequate volume of blood received in culture bottles   Culture   Final    NO GROWTH 5 DAYS Performed at [facility], [address], [place], [place] [address]    Report Status 04/25/2019 FINAL  Final  MRSA PCR Screening     Status: None   Collection Time: 04/20/19  3:43 PM   Specimen: Nasal Mucosa; Nasopharyngeal  Result Value Ref Range Status   MRSA by PCR NEGATIVE NEGATIVE Final    Comment:        The GeneXpert MRSA Assay (FDA approved for NASAL specimens only), is one component of a comprehensive MRSA colonization surveillance program. It is not intended to diagnose MRSA infection nor to guide or monitor treatment for MRSA infections. Performed at [facility], [address], [place], [place] [address]   SARS CORONAVIRUS 2 (TAT 6-24 HRS) Nasopharyngeal Nasopharyngeal Swab     Status: None   Collection Time: 04/23/19 11:12 PM   Specimen: Nasopharyngeal Swab  Result Value Ref Range Status   SARS Coronavirus 2 NEGATIVE NEGATIVE Final    Comment: (NOTE) SARS-CoV-2 target nucleic acids are NOT  DETECTED. The SARS-CoV-2 RNA is generally detectable in upper and lower respiratory specimens during the acute phase of infection. Negative results do not preclude SARS-CoV-2 infection, do not rule out co-infections with other pathogens, and should not be used as the sole basis for treatment or other patient management decisions. Negative results must be combined with clinical observations, patient history, and epidemiological information. The expected result is Negative. Fact Sheet for Patients: SugarRoll.be Fact Sheet for Healthcare Providers: https://www.woods-mathews.com/ This test is not yet approved or cleared by the [place] FDA and  has been authorized for detection and/or diagnosis of SARS-CoV-2 by FDA under an Emergency Use Authorization (EUA). This EUA will remain  in effect (meaning this test can be used) for the duration of the COVID-19 declaration under Section 56 4(b)(1) of the Act, 21 U.S.C. section 360bbb-3(b)(1), unless the authorization is terminated or revoked sooner. Performed at [facility], [place] [address], [place] [employer] [place], [place] [address]     Radiology Reports DG Chest 2 View  Result Date: 04/26/2019 CLINICAL DATA:  Dyspnea EXAM: CHEST - 2 VIEW COMPARISON:  04/23/2019 FINDINGS: No significant change in diffuse bilateral interstitial pulmonary opacity. No new or focal airspace opacity. The heart mediastinum are unremarkable. Gas-filled bowel in the upper abdomen. IMPRESSION: No significant change in diffuse bilateral interstitial pulmonary opacity, consistent with edema or infection. No new or focal  airspace opacity. Electronically Signed   By: Eddie Candle M.D.   On: 04/26/2019 10:12   CT Angio Chest PE W and/or Wo Contrast  Result Date: 04/20/2019 CLINICAL DATA:  Shortness of breath. EXAM: CT ANGIOGRAPHY CHEST WITH CONTRAST TECHNIQUE: Multidetector CT imaging of the chest was performed using the standard protocol during bolus administration of intravenous contrast. Multiplanar CT image reconstructions and MIPs were obtained to evaluate the vascular anatomy. CONTRAST:  178mL OMNIPAQUE IOHEXOL 350 MG/ML SOLN COMPARISON:  None. FINDINGS: Cardiovascular: Satisfactory opacification of the pulmonary arteries to the segmental level. No evidence of pulmonary embolism. Normal heart size. No pericardial effusion. Atherosclerosis of thoracic aorta is noted without aneurysm or dissection. Mediastinum/Nodes: Thyroid gland and esophagus are unremarkable. Enlarged pre-vascular a subcarinal adenopathy is noted which most likely is inflammatory in etiology. Lungs/Pleura: No pneumothorax is noted. Minimal bilateral pleural effusions are noted. Multiple airspace opacities are noted throughout both lungs most consistent with multifocal pneumonia, most likely of viral etiology. Upper Abdomen: No acute abnormality. Musculoskeletal: No chest wall abnormality. No acute or significant osseous findings. Review of the MIP images confirms the above findings. IMPRESSION: 1. No definite evidence of pulmonary embolus. 2. Multiple airspace opacities are noted throughout both lungs most consistent with multifocal pneumonia, most likely of viral etiology. 3. Minimal bilateral pleural effusions are noted. 4. Enlarged mediastinal adenopathy is noted which most likely is inflammatory in etiology. Aortic Atherosclerosis (ICD10-I70.0). Electronically Signed   By: Marijo Conception M.D.   On: 04/20/2019 10:54   US Venous Img Lower Unilateral Right  Result Date: 04/20/2019 CLINICAL DATA:  Swelling x5 days EXAM: RIGHT LOWER EXTREMITY VENOUS  DOPPLER ULTRASOUND TECHNIQUE: Gray-scale sonography with compression, as well as color and duplex ultrasound, were performed to evaluate the deep venous system(s) from the level of the common femoral vein through the popliteal and proximal calf veins. COMPARISON:  None. FINDINGS: VENOUS Normal compressibility of the common femoral, superficial femoral, and popliteal veins, as well as the visualized calf veins. Visualized portions of profunda femoral vein and great saphenous vein unremarkable. No filling defects to suggest DVT on grayscale or color Doppler  imaging. Doppler waveforms show normal direction of venous flow, normal respiratory phasicity and response to augmentation. Limited views of the contralateral common femoral vein are unremarkable. OTHER Subcutaneous calf edema. Limitations: none IMPRESSION: No femoropopliteal DVT nor evidence of DVT within the visualized calf veins. If clinical symptoms are inconsistent or if there are persistent or worsening symptoms, further imaging (possibly involving the iliac veins) may be warranted. Electronically Signed   By: Lucrezia Europe M.D.   On: 04/20/2019 10:33   DG Chest Port 1 View  Result Date: 04/23/2019 CLINICAL DATA:  Short of breath for 1 week, tobacco abuse EXAM: PORTABLE CHEST 1 VIEW COMPARISON:  04/20/2019 at 8:53 a.m. FINDINGS: Single frontal view of the chest demonstrates a stable cardiac silhouette. Persistent multifocal bilateral ground-glass airspace disease unchanged. No effusion or pneumothorax. No acute bony abnormality. IMPRESSION: 1. Stable multifocal bilateral ground-glass airspace disease most consistent with atypical viral pneumonia such as COVID-19. Electronically Signed   By: Randa Ngo M.D.   On: 04/23/2019 21:44   DG Chest Portable 1 View  Result Date: 04/20/2019 CLINICAL DATA:  Shortness of breath, history coronary artery disease, hypertension, type II diabetes mellitus, COPD, smoker EXAM: PORTABLE CHEST 1 VIEW COMPARISON:  Portable  exam 0853 hours compared to 01/28/2019 FINDINGS: Lordotic positioning. Enlargement of cardiac silhouette. Mediastinal contours normal. Patchy BILATERAL pulmonary infiltrates consistent with multifocal pneumonia. No pleural effusion or pneumothorax. No acute osseous findings. IMPRESSION: Patchy BILATERAL pulmonary infiltrates consistent with multifocal pneumonia; question of COVID-19 pneumonia is raised. Electronically Signed   By: Lavonia Dana M.D.   On: 04/20/2019 09:00   ECHOCARDIOGRAM COMPLETE  Result Date: 04/20/2019    ECHOCARDIOGRAM REPORT   Patient Name:   WILBERTO HOYOS Date of Exam: 04/20/2019 Medical Rec #:  [id number]       Height:       67.0 in Accession #:    [id number]      Weight:       198.4 lb Date of Birth:  02-Oct-1945      BSA:          2.015 m Patient Age:    23 years        BP:           119/65 mmHg Patient Gender: M               HR:           104 bpm. Exam Location:  [facility] Procedure: 2D Echo Indications:    Dyspnea 786.09 / R06.00  History:        Patient has prior history of Echocardiogram examinations, most                 recent 07/03/2016. CAD, COPD; Risk Factors:Diabetes, Dyslipidemia                 and Hypertension. Acute respiratory failure with hypoxia ,                 Peripheral arterial disease.  Sonographer:    Leavy Cella RDCS (AE) Referring Phys: [place]  1. Left ventricular ejection fraction, by estimation, is 50 to 55%. The left ventricle has low normal function. The left ventricle has no regional wall motion abnormalities. Indeterminate diastolic filling due to E-A fusion. Elevated left ventricular end-diastolic pressure.  2. Right ventricular systolic function is mildly reduced. The right ventricular size is normal. There is moderately elevated pulmonary artery systolic pressure.  3. Left atrial size was  moderately dilated.  4. The mitral valve is grossly normal. Mild mitral valve regurgitation.  5. The aortic valve is tricuspid. Aortic valve  regurgitation is not visualized. No aortic stenosis is present.  6. The inferior vena cava is dilated in size with >50% respiratory variability, suggesting right atrial pressure of 8 mmHg. FINDINGS  Left Ventricle: Left ventricular ejection fraction, by estimation, is 50 to 55%. The left ventricle has low normal function. The left ventricle has no regional wall motion abnormalities. The left ventricular internal cavity size was normal in size. There is no left ventricular hypertrophy. Indeterminate diastolic filling due to E-A fusion. Elevated left ventricular end-diastolic pressure. Right Ventricle: The right ventricular size is normal. No increase in right ventricular wall thickness. Right ventricular systolic function is mildly reduced. There is moderately elevated pulmonary artery systolic pressure. The tricuspid regurgitant velocity is 3.30 m/s, and with an assumed right atrial pressure of 10 mmHg, the estimated right ventricular systolic pressure is [id number] mmHg. Left Atrium: Left atrial size was moderately dilated. Right Atrium: Right atrial size was normal in size. Pericardium: There is no evidence of pericardial effusion. Mitral Valve: The mitral valve is grossly normal. Mild mitral valve regurgitation. Tricuspid Valve: The tricuspid valve is grossly normal. Tricuspid valve regurgitation is mild. Aortic Valve: The aortic valve is tricuspid. . There is mild thickening of the aortic valve. Aortic valve regurgitation is not visualized. No aortic stenosis is present. There is mild thickening of the aortic valve. Pulmonic Valve: The pulmonic valve was grossly normal. Pulmonic valve regurgitation is not visualized. Aorta: The aortic root is normal in size and structure. Venous: The inferior vena cava is dilated in size with greater than 50% respiratory variability, suggesting right atrial pressure of 8 mmHg. IAS/Shunts: No atrial level shunt detected by color flow Doppler.  LEFT VENTRICLE PLAX 2D LVIDd:         5.04  cm  Diastology LVIDs:         4.45 cm  LV e' lateral:   8.16 cm/s LV PW:         1.28 cm  LV E/e' lateral: 10.7 LV IVS:        0.86 cm  LV e' medial:    4.03 cm/s LVOT diam:     2.10 cm  LV E/e' medial:  21.7 LVOT Area:     3.46 cm  RIGHT VENTRICLE RV S prime:     6.31 cm/s TAPSE (M-mode): 1.1 cm LEFT ATRIUM             Index       RIGHT ATRIUM           Index LA diam:        5.20 cm 2.58 cm/m  RA Area:     22.20 cm LA Vol (A2C):   68.3 ml 33.89 ml/m RA Volume:   78.40 ml  38.90 ml/m LA Vol (A4C):   80.0 ml 39.69 ml/m LA Biplane Vol: 80.7 ml 40.04 ml/m   AORTA Ao Root diam: 3.20 cm MITRAL VALVE               TRICUSPID VALVE MV Area (PHT): 4.60 cm    TR Peak grad:   43.6 mmHg MV Decel Time: 165 msec    TR Vmax:        330.00 cm/s MR Peak grad: 60.2 mmHg MR Mean grad: 42.0 mmHg    SHUNTS MR Vmax:      388.00 cm/s  Systemic Diam: 2.10 cm  MR Vmean:     307.0 cm/s MV E velocity: 87.30 cm/s MV A velocity: 39.80 cm/s MV E/A ratio:  2.19 Kate Sable MD Electronically signed by Kate Sable MD Signature Date/Time: 04/20/2019/3:05:50 PM    Final      CBC Recent Labs  Lab 04/20/19 [id number] 04/20/19 1206 04/21/19 0401 04/22/19 0426 04/23/19 2037 04/24/19 0151 04/26/19 0532  WBC 8.7   < > 11.0* 17.7* 15.3* 14.2* 15.5*  HGB 9.6*   < > 8.9* 9.1* 9.7* 9.2* 10.3*  HCT 31.6*   < > 28.9* 29.4* 31.6* 29.6* 32.9*  PLT 265   < > 266 342 372 353 397  MCV 89.8   < > 89.8 87.2 88.3 87.8 86.6  MCH 27.3   < > 27.6 27.0 27.1 27.3 27.1  MCHC 30.4   < > 30.8 31.0 30.7 31.1 31.3  RDW 15.8*   < > 15.9* 15.9* 15.9* 15.9* 16.1*  LYMPHSABS 0.9  --   --   --  0.5*  --   --   MONOABS 0.7  --   --   --  0.8  --   --   EOSABS 0.4  --   --   --  0.0  --   --   BASOSABS 0.1  --   --   --  0.0  --   --    < > = values in this interval not displayed.    Chemistries  Recent Labs  Lab 04/20/19 0846 04/20/19 1206 04/20/19 1455 04/21/19 0401 04/22/19 0426 04/22/19 0841 04/23/19 0544 04/23/19 2037 04/24/19 0151  04/26/19 0532  NA 138  --   --    < > 136  --  136 136 138 134*  K 3.7  --   --    < > 4.2  --  4.1 4.3 4.2 4.3  CL 107  --   --    < > 105  --  103 102 106 99  CO2 21*  --   --    < > 21*  --  21* 21* 22 24  GLUCOSE 153*  --   --    < > 219*  --  224* 269* 181* 227*  BUN 16  --   --    < > 36*  --  41* 45* 45* 49*  CREATININE 1.21   < >  --    < > 1.54*  --  1.54* 1.82* 1.58* 1.21  CALCIUM 8.3*  --   --    < > 8.7*  --  8.6* 8.5* 8.4* 8.6*  MG  --   --  1.8  --   --  2.2 2.2  --   --   --   AST 25  --   --   --   --   --   --  28  --   --   ALT 39  --   --   --   --   --   --  46*  --   --   ALKPHOS 98  --   --   --   --   --   --  85  --   --   BILITOT 0.7  --   --   --   --   --   --  0.6  --   --    < > = values in this interval not displayed.   ------------------------------------------------------------------------------------------------------------------ No results  for input(s): CHOL, HDL, LDLCALC, TRIG, CHOLHDL, LDLDIRECT in the last 72 hours.  Lab Results  Component Value Date   HGBA1C 7.6 (H) 04/20/2019   ------------------------------------------------------------------------------------------------------------------ No results for input(s): TSH, T4TOTAL, T3FREE, THYROIDAB in the last 72 hours.  Invalid input(s): FREET3 ------------------------------------------------------------------------------------------------------------------ No results for input(s): VITAMINB12, FOLATE, FERRITIN, TIBC, IRON, RETICCTPCT in the last 72 hours.  Coagulation profile Recent Labs  Lab 04/20/19 0846  INR 1.3*    No results for input(s): DDIMER in the last 72 hours.  Cardiac Enzymes No results for input(s): CKMB, TROPONINI, MYOGLOBIN in the last 168 hours.  Invalid input(s): CK ------------------------------------------------------------------------------------------------------------------    Component Value Date/Time   BNP 793.0 (H) 04/23/2019 2037     Roxan Hockey  M.D on 04/26/2019 at 5:14 PM  Go to [email or web address] - for contact info  [facility] - Office  [phone]

## 2019-04-27 LAB — BASIC METABOLIC PANEL
Anion gap: 12 (ref 5–15)
BUN: 50 mg/dL — ABNORMAL HIGH (ref 8–23)
CO2: 27 mmol/L (ref 22–32)
Calcium: 8.9 mg/dL (ref 8.9–10.3)
Chloride: 97 mmol/L — ABNORMAL LOW (ref 98–111)
Creatinine, Ser: 1.27 mg/dL — ABNORMAL HIGH (ref 0.61–1.24)
GFR calc Af Amer: >60 mL/min (ref >60)
GFR calc non Af Amer: 55 mL/min — ABNORMAL LOW (ref >60)
Glucose, Bld: 110 mg/dL — ABNORMAL HIGH (ref 70–99)
Potassium: 4.1 mmol/L (ref 3.5–5.1)
Sodium: 136 mmol/L (ref 135–145)

## 2019-04-27 LAB — GLUCOSE, CAPILLARY
Glucose-Capillary: 171 mg/dL — ABNORMAL HIGH (ref 70–99)
Glucose-Capillary: 347 mg/dL — ABNORMAL HIGH (ref 70–99)
Glucose-Capillary: 274 mg/dL — ABNORMAL HIGH (ref 70–99)
Glucose-Capillary: 376 mg/dL — ABNORMAL HIGH (ref 70–99)

## 2019-04-27 MED ORDER — METOLAZONE 5 MG PO TABS
2.5000 mg | ORAL_TABLET | Freq: Once | ORAL | Status: AC
  Administered 2019-04-28: 08:00:00 2.5 mg via ORAL
  Filled 2019-04-27: qty 1

## 2019-04-27 MED ORDER — IPRATROPIUM-ALBUTEROL 0.5-2.5 (3) MG/3ML IN SOLN
3.0000 mL | Freq: Three times a day (TID) | RESPIRATORY_TRACT | Status: DC
  Administered 2019-04-27 – 2019-04-28 (×2): 3 mL via RESPIRATORY_TRACT
  Filled 2019-04-27 (×3): qty 3

## 2019-04-27 NOTE — Progress Notes (Signed)
Physical Therapy Treatment Patient Details Name: Peter Lambert MRN: [id number] DOB: Sep 17, 1945 Today's Date: 04/27/2019    History of Present Illness Peter Lambert is a 74 y.o. male with a past medical history significant for tobacco abuse, hypertension, hyperlipidemia, history of coronary disease/myocardial infarction in the past, type 2 diabetes mellitus and gastroesophageal reflux disease; who presented to the hospital secondary to approximately 7-8 days of worsening shortness of breath.  Patient has been seen at the office of his PCP where he received 2 shots for what appears to be COPD exacerbation without significant improvement and continues worsening in his breathing status.  Patient reports some lower extremity edema, orthopnea and short of breath on exertion.  He expressed increased wheezing and no response to his home bronchodilator inhaler.  Patient denies any fever, chills, rhinorrhea, chest pain, abdominal pain, dysuria, hematuria, melena, hematochezia, headaches, focal weakness or any other complaints.In the ED he was found to be hypoxic in the high 80s on room air and very tachypneic with diffuse wheezing.  Covid test was negative.    PT Comments    Patient able to complete exercises while seated in chair with good mechanics following demonstration and cueing. He is educated on performing while in hospital and when returning home to help improve LE circulation. He was on 2L O2 throughout today's session and O2 sat remained between 82-99% with min/no SOB. He ambulates increased distance today and demonstrates min/mod impaired balance with some staggering and unsteadiness during ambulation. Patient tolerates session well and states he feels better after moving. Patient will benefit from continued physical therapy in hospital and recommended venue below to increase strength, balance, endurance for safe ADLs and gait.    Follow Up Recommendations  Home health PT;Supervision -  Intermittent     Equipment Recommendations  None recommended by PT    Recommendations for Other Services       Precautions / Restrictions Precautions Precautions: Fall Restrictions Weight Bearing Restrictions: No    Mobility  Bed Mobility               General bed mobility comments: seated in chair upon enterance for session  Transfers Overall transfer level: Needs assistance Equipment used: None Transfers: Sit to/from Stand;Stand Pivot Transfers Sit to Stand: Supervision Stand pivot transfers: Supervision       General transfer comment: to transition to standing without AD; O2 sat varies throughout session from 82-99% on 2 L  Ambulation/Gait Ambulation/Gait assistance: Min guard Gait Distance (Feet): 130 Feet Assistive device: None Gait Pattern/deviations: Step-through pattern;Decreased step length - right;Decreased step length - left;Decreased stride length Gait velocity: decreased   General Gait Details: slightly slow, labored, min SOB, O2 sat varries between 82-99% on 2L while ambulating, some unsteadiness/staggering   Stairs             Wheelchair Mobility    Modified Rankin (Stroke Patients Only)       Balance Overall balance assessment: Needs assistance Sitting-balance support: No upper extremity supported;Feet supported Sitting balance-Leahy Scale: Normal Sitting balance - Comments: seated edge of chair   Standing balance support: No upper extremity supported;During functional activity Standing balance-Leahy Scale: Fair Standing balance comment: fair without AD while ambulating                            Cognition Arousal/Alertness: Awake/alert Behavior During Therapy: WFL for tasks assessed/performed Overall Cognitive Status: Within Functional Limits for tasks assessed  Exercises General Exercises - Lower Extremity Ankle Circles/Pumps: AROM;Both;20  reps;Seated Long Arc Quad: AROM;Both;20 reps;Seated Hip Flexion/Marching: AROM;Both;20 reps;Seated    General Comments        Pertinent Vitals/Pain Pain Assessment: No/denies pain    Home Living                      Prior Function            PT Goals (current goals can now be found in the care plan section) Acute Rehab PT Goals Patient Stated Goal: Return home PT Goal Formulation: With patient Time For Goal Achievement: 05/08/19 Potential to Achieve Goals: Good Progress towards PT goals: Progressing toward goals    Frequency    Min 3X/week      PT Plan Current plan remains appropriate    Co-evaluation              AM-PAC PT "6 Clicks" Mobility   Outcome Measure  Help needed turning from your back to your side while in a flat bed without using bedrails?: None Help needed moving from lying on your back to sitting on the side of a flat bed without using bedrails?: None Help needed moving to and from a bed to a chair (including a wheelchair)?: None Help needed standing up from a chair using your arms (e.g., wheelchair or bedside chair)?: None Help needed to walk in hospital room?: A Little Help needed climbing 3-5 steps with a railing? : A Little 6 Click Score: 22    End of Session Equipment Utilized During Treatment: Oxygen Activity Tolerance: Patient tolerated treatment well Patient left: in chair;with call bell/phone within reach Nurse Communication: Mobility status PT Visit Diagnosis: Unsteadiness on feet (R26.81);Other abnormalities of gait and mobility (R26.89);Muscle weakness (generalized) (M62.81)     Time: [id number] PT Time Calculation (min) (ACUTE ONLY): 24 min  Charges:  $Therapeutic Exercise: 8-22 mins $Therapeutic Activity: 8-22 mins                     2:25 PM, 04/27/19 Mearl Latin PT, DPT Physical Therapist at [facility]

## 2019-04-27 NOTE — Progress Notes (Signed)
Inpatient Diabetes Program Recommendations  AACE/ADA: New Consensus Statement on Inpatient Glycemic Control (2015)  Target Ranges:  Prepandial:   less than 140 mg/dL      Peak postprandial:   less than 180 mg/dL (1-2 hours)      Critically ill patients:  140 - 180 mg/dL   Lab Results  Component Value Date   GLUCAP 347 (H) 04/27/2019   HGBA1C 7.6 (H) 04/20/2019    Review of Glycemic Control Results for Peter Lambert, Peter Lambert (MRN [id number]) as of 04/27/2019 12:16  Ref. Range 04/26/2019 11:15 04/26/2019 16:07 04/26/2019 21:21 04/27/2019 07:53 04/27/2019 11:17  Glucose-Capillary Latest Ref Range: 70 - 99 mg/dL 336 (H) 265 (H) 381 (H) 171 (H) 347 (H)   Diabetes history: DM2 Outpatient Diabetes medications: none Current orders for Inpatient glycemic control: Novolog 0-20 units TID with meals, Novolog 0-10 units QHS; Solumedrol 40 mg Q12H  Inpatient Diabetes Program Recommendations:    Insulin-Meal Coverage: If steroids are continued, please consider ordering Novolog 3 units TID with meals for meal coverage if patient eats at least 50% of meals.  Thank you, Nani Gasser. Jeff Mccallum, RN, MSN, CDE  Diabetes Coordinator Inpatient Glycemic Control Team Team Pager [phone] (8am-5pm) 04/27/2019 12:26 PM

## 2019-04-27 NOTE — TOC Initial Note (Signed)
Transition of Care [facility]) - Initial/Assessment Note    Patient Details  Name: GENNIE DIB MRN: [id number] Date of Birth: [birth year]  Transition of Care [facility]) CM/SW Contact:    Shade Flood, LCSW Phone Number: 04/27/2019, 4:27 PM  Clinical Narrative:                  Pt admitted from home. PT recommending HH at dc. Met with pt today to assess. Home O2 arrangements were initiated with pt during his last stay when pt left AMA. Pt was given portable O2 tanks to leave the hospital. Pt then returned to the hospital before home concentrator could be delivered. Md anticipating dc tomorrow.  Met with pt to discuss dc planning. He is agreeable to [facility]. Discussed provider options. Will refer as requested. Updated Kathy from [employer] on dc date and she will follow up for O2 needs for dc.  Will follow up in AM to further assist with dc planning.   Expected Discharge Plan: [place] Barriers to Discharge: Continued Medical Work up   Patient Goals and CMS Choice Patient states their goals for this hospitalization and ongoing recovery are:: go home as soon as possible CMS Medicare.gov Compare Post Acute Care list provided to:: Patient Choice offered to / list presented to : Patient  Expected Discharge Plan and Services Expected Discharge Plan: [place] In-house Referral: Clinical Social Work   Post Acute Care Choice: [place] arrangements for the past 2 months: [place]                                      Prior Living Arrangements/Services Living arrangements for the past 2 months: Single Family Home Lives with:: Self Patient language and need for interpreter reviewed:: Yes Do you feel safe going back to the place where you live?: Yes      Need for Family Participation in Patient Care: No (Comment) Care giver support system in place?: No (comment)   Criminal Activity/Legal Involvement Pertinent to Current  Situation/Hospitalization: No - Comment as needed  Activities of Daily Living Home Assistive Devices/Equipment: None ADL Screening (condition at time of admission) Patient's cognitive ability adequate to safely complete daily activities?: Yes Is the patient deaf or have difficulty hearing?: No Does the patient have difficulty seeing, even when wearing glasses/contacts?: No Does the patient have difficulty concentrating, remembering, or making decisions?: No Patient able to express need for assistance with ADLs?: Yes Does the patient have difficulty dressing or bathing?: No Independently performs ADLs?: Yes (appropriate for developmental age) Does the patient have difficulty walking or climbing stairs?: No Weakness of Legs: None Weakness of Arms/Hands: None  Permission Sought/Granted Permission sought to share information with : Chartered certified accountant granted to share information with : Yes, Verbal Permission Granted     Permission granted to share info w AGENCY: local home healths        Emotional Assessment Appearance:: Appears stated age Attitude/Demeanor/Rapport: Engaged Affect (typically observed): Pleasant Orientation: : Oriented to Self, Oriented to Place, Oriented to  Time, Oriented to Situation Alcohol / Substance Use: Not Applicable Psych Involvement: No (comment)  Admission diagnosis:  SOB (shortness of breath) [R06.02] Hypoxia [R09.02] Acute on chronic respiratory failure with hypoxia (HCC) [J96.21] Multifocal pneumonia [J18.9] Patient Active Problem List   Diagnosis Date Noted  . Acute on chronic respiratory failure with hypoxia (HCC)  04/23/2019  . Lobar pneumonia ([place]) 04/22/2019  . Multifocal pneumonia   . Demand ischemia of myocardium ([place])   . Atrial flutter ([place])   . Acute on chronic diastolic heart failure ([place])   . Acute respiratory failure with hypoxia ([place]) 04/20/2019  . Acute respiratory failure with hypoxemia ([place]) 07/02/2016  .  COPD exacerbation ([place]) 07/02/2016  . Tobacco use 07/17/2013  . Peripheral arterial disease ([place]) 12/03/2012  . Coronary artery disease 12/03/2012  . Essential hypertension 12/03/2012  . Hyperlipidemia 12/03/2012  . New onset type 2 diabetes mellitus ([place]) 12/03/2012   PCP:  Redmond School, MD Pharmacy:   [id number] [place] [id number]: [phone]: [phone]    Social Determinants of Health (SDOH) Interventions    Readmission Risk Interventions Readmission Risk Prevention Plan 04/27/2019  Transportation Screening Complete  Home Care Screening Complete  Medication Review (RN CM) Complete  Some recent data might be hidden

## 2019-04-27 NOTE — Care Management Important Message (Signed)
Important Message  Patient Details  Name: Peter Lambert MRN: [id number] Date of Birth: 1945/03/18   Medicare Important Message Given:  Yes     Tommy Medal 04/27/2019, 11:06 AM

## 2019-04-27 NOTE — Progress Notes (Signed)
Patient Demographics:    Peter Lambert, is a 74 y.o. male, DOB - 18-Aug-1945, [id number]  Admit date - 04/23/2019   Admitting Physician Lynetta Mare, MD  Outpatient Primary MD for the patient is Redmond School, MD  LOS - 4   Chief Complaint  Patient presents with  . Shortness of Breath        Subjective:    Peter Lambert today has no fevers, no emesis,  No chest pain,   -Significant lower extremity edema, orthopnea and dyspnea on exertion persist  -Voiding okay  Assessment  & Plan :    Principal Problem:   Acute respiratory failure with hypoxemia (HCC) Active Problems:   Coronary artery disease   Essential hypertension   Hyperlipidemia   Tobacco use   COPD exacerbation (HCC)   Multifocal pneumonia   Atrial flutter (HCC)   Acute on chronic respiratory failure with hypoxia [facility])  Brief Summary:- 74 y.o. male with medical history significant of tobacco abuse (quit 02/2019), HTN, HLD, COPD , H/o CAD, prior MI,  DM2 and GERD admitted on 04/23/2019 with acute hypoxemia, respiratory failure, pneumonia with acute COPD Exacerbation, acute CHF exacerbation, COPD   A/p  1) acute hypoxic respiratory failure--- secondary to combination of CHF, pneumonia and COPD exacerbation--- --patient was initially seen in the ED on 04/20/2019, and sent home on home O2 after refusing admission to the hospital/leaving AMA -Readmitted 04/23/2019 as above for same - currently requiring  5 L of oxygen via nasal cannula  2) acute COPD exacerbation--- continue IV Solu-Medrol, mucolytics, bronchodilators, antibiotics as below #4 as prescribed -Patient quit smoking in January 2021 -Dyspnea on exertion and cough persist  3)HFpEF--Echo with EF is 50 to 55%, , acute on chronic diastolic CHF exacerbation,  -Significant lower extremity edema, orthopnea and dyspnea on exertion persist  -Soft blood pressure makes it  difficult to use high-dose IV Lasix, added metolazone for better diuresis -Daily weight chart appears inaccurate  -Wt 175 Lb>>181>>165.9 -Fluid balance continues to be negative   4) community-acquired pneumonia--- hypoxia persist, cough and shortness of breath improving,  -Okay to complete IV Rocephin / azithromycin -last dose 04/27/2019 -Continue mucolytics bronchodilators as ordered  5)H/o CAD--stable, continue aspirin, Plavix and Lipitor -Metoprolol 12.5 mg twice daily as ordered  6)Generalized Weakness and Deconditioning----PT eval appreciated recommends home health therapy  Disposition/Need for in-Hospital Stay- patient unable to be discharged at this time due to --CHF exacerbation requiring IV Lasix, community-acquired pneumonia requiring IV antibiotics, acute hypoxic respiratory failure requiring oxygen supplementation --Significant lower extremity edema, orthopnea and dyspnea on exertion persist  -Possible discharge home with home health but not medically ready at this time due to persistent dyspnea on exertion, orthopnea requiring oxygen at 5 L/min  Code Status : Full  Family Communication:   (patient is alert, awake and coherent) Discussed with son Peeler by phone Consults  :  na  DVT Prophylaxis  : Heparin - SCDs   Lab Results  Component Value Date   PLT 397 04/26/2019   Inpatient Medications  Scheduled Meds: . aspirin  81 mg Oral Daily  . atorvastatin  20 mg Oral Daily  . clopidogrel  75 mg Oral Daily  . colchicine  0.6 mg Oral Daily  . ferrous sulfate  325 mg Oral TID with meals  . furosemide  20 mg Intravenous BID  . guaiFENesin  600 mg Oral BID  . heparin  5,000 Units Subcutaneous Q8H  . insulin aspart  0-10 Units Subcutaneous QHS  . insulin aspart  0-20 Units Subcutaneous TID WC  . ipratropium-albuterol  3 mL Nebulization 4x daily  . methylPREDNISolone (SOLU-MEDROL) injection  40 mg Intravenous Q12H  . metoprolol tartrate  12.5 mg Oral BID  . potassium  chloride SA  20 mEq Oral Daily  . sodium chloride flush  3 mL Intravenous Q12H   Continuous Infusions: . sodium chloride     PRN Meds:.sodium chloride, acetaminophen **OR** acetaminophen, bisacodyl, diazepam, nitroGLYCERIN, ondansetron **OR** ondansetron (ZOFRAN) IV, polyethylene glycol, sodium chloride flush, traMADol    Anti-infectives (From admission, onward)   Start     Dose/Rate Route Frequency Ordered Stop   04/24/19 1000  cefTRIAXone (ROCEPHIN) 1 g in sodium chloride 0.9 % 100 mL IVPB  Status:  Discontinued     1 g 200 mL/hr over 30 Minutes Intravenous Every 24 hours 04/23/19 2326 04/27/19 1221   04/24/19 1000  azithromycin (ZITHROMAX) 500 mg in sodium chloride 0.9 % 250 mL IVPB  Status:  Discontinued     500 mg 250 mL/hr over 60 Minutes Intravenous Every 24 hours 04/23/19 2326 04/27/19 1221        Objective:   Vitals:   04/26/19 2120 04/27/19 0444 04/27/19 0747 04/27/19 1141  BP: 111/70 118/64    Pulse: 89 84    Resp: 18     Temp: 97.8 F (36.6 C) 98 F (36.7 C)    TempSrc: Oral Oral    SpO2: 91% 90% 90% 100%  Weight:  75.3 kg    Height:        Wt Readings from Last 3 Encounters:  04/27/19 75.3 kg  04/20/19 83.3 kg  02/27/18 89.4 kg     Intake/Output Summary (Last 24 hours) at 04/27/2019 1224 Last data filed at 04/27/2019 1207 Gross per 24 hour  Intake 480 ml  Output 3600 ml  Net -3120 ml     Physical Exam  Gen:- Awake Alert, +ve DOE HEENT:- [place].AT, No sclera icterus Nose- [place] 5 L/min Neck-Supple Neck, +ve JVD,.  Lungs-diminished breath sounds, bibasilar rales  CV- S1, S2 normal, regular  Abd-  +ve B.Sounds, Abd Soft, No tenderness,    Extremity :-  2 to 3 +ve LE  Edema (ACE Wraps), pedal pulses present Psych-affect is appropriate, oriented x3 Neuro-generalized weakness, no new focal deficits, no tremors Skin-- Lt lateral leg area with open wound with serous fluid   Data Review:   Micro Results Recent Results (from the past 240 hour(s))    Respiratory Panel by RT PCR (Flu A&B, Covid) - Nasopharyngeal Swab     Status: None   Collection Time: 04/20/19  9:28 AM   Specimen: Nasopharyngeal Swab  Result Value Ref Range Status   SARS Coronavirus 2 by RT PCR NEGATIVE NEGATIVE Final    Comment: (NOTE) SARS-CoV-2 target nucleic acids are NOT DETECTED. The SARS-CoV-2 RNA is generally detectable in upper respiratoy specimens during the acute phase of infection. The lowest concentration of SARS-CoV-2 viral copies this assay can detect is 131 copies/mL. A negative result does not preclude SARS-Cov-2 infection and should not be used as the sole basis for treatment or other patient management decisions. A negative result may occur with  improper specimen collection/handling, submission of specimen other than nasopharyngeal swab, presence of viral mutation(s) within  the areas targeted by this assay, and inadequate number of viral copies (<131 copies/mL). A negative result must be combined with clinical observations, patient history, and epidemiological information. The expected result is Negative. Fact Sheet for Patients:  PinkCheek.be Fact Sheet for Healthcare Providers:  GravelBags.it This test is not yet ap proved or cleared by the [place] FDA and  has been authorized for detection and/or diagnosis of SARS-CoV-2 by FDA under an Emergency Use Authorization (EUA). This EUA will remain  in effect (meaning this test can be used) for the duration of the COVID-19 declaration under Section 564(b)(1) of the Act, 21 U.S.C. section 360bbb-3(b)(1), unless the authorization is terminated or revoked sooner.    Influenza A by PCR NEGATIVE NEGATIVE Final   Influenza B by PCR NEGATIVE NEGATIVE Final    Comment: (NOTE) The Xpert Xpress SARS-CoV-2/FLU/RSV assay is intended as an aid in  the diagnosis of influenza from Nasopharyngeal swab specimens and  should not be used as a sole  basis for treatment. Nasal washings and  aspirates are unacceptable for Xpert Xpress SARS-CoV-2/FLU/RSV  testing. Fact Sheet for Patients: PinkCheek.be Fact Sheet for Healthcare Providers: GravelBags.it This test is not yet approved or cleared by the [place] FDA and  has been authorized for detection and/or diagnosis of SARS-CoV-2 by  FDA under an Emergency Use Authorization (EUA). This EUA will remain  in effect (meaning this test can be used) for the duration of the  Covid-19 declaration under Section 564(b)(1) of the Act, 21  U.S.C. section 360bbb-3(b)(1), unless the authorization is  terminated or revoked. Performed at [facility], [address], [place], [place] [address]   Blood culture (routine x 2)     Status: None   Collection Time: 04/20/19 10:57 AM   Specimen: BLOOD LEFT ARM  Result Value Ref Range Status   Specimen Description BLOOD LEFT ARM  Final   Special Requests   Final    BOTTLES DRAWN AEROBIC AND ANAEROBIC Blood Culture results may not be optimal due to an inadequate volume of blood received in culture bottles   Culture   Final    NO GROWTH 5 DAYS Performed at [facility], [address], [place], [place] [address]    Report Status 04/25/2019 FINAL  Final  Blood culture (routine x 2)     Status: None   Collection Time: 04/20/19 11:02 AM   Specimen: BLOOD LEFT WRIST  Result Value Ref Range Status   Specimen Description BLOOD LEFT WRIST  Final   Special Requests   Final    BOTTLES DRAWN AEROBIC AND ANAEROBIC Blood Culture results may not be optimal due to an inadequate volume of blood received in culture bottles   Culture   Final    NO GROWTH 5 DAYS Performed at [facility], [address], [place], [place] [address]    Report Status 04/25/2019 FINAL  Final  MRSA PCR Screening     Status: None   Collection Time: 04/20/19  3:43 PM   Specimen: Nasal Mucosa; Nasopharyngeal  Result Value Ref Range  Status   MRSA by PCR NEGATIVE NEGATIVE Final    Comment:        The GeneXpert MRSA Assay (FDA approved for NASAL specimens only), is one component of a comprehensive MRSA colonization surveillance program. It is not intended to diagnose MRSA infection nor to guide or monitor treatment for MRSA infections. Performed at [facility], [address] [place], [place] [address]   SARS CORONAVIRUS 2 (TAT 6-24  HRS) Nasopharyngeal Nasopharyngeal Swab     Status: None   Collection Time: 04/23/19 11:12 PM   Specimen: Nasopharyngeal Swab  Result Value Ref Range Status   SARS Coronavirus 2 NEGATIVE NEGATIVE Final    Comment: (NOTE) SARS-CoV-2 target nucleic acids are NOT DETECTED. The SARS-CoV-2 RNA is generally detectable in upper and lower respiratory specimens during the acute phase of infection. Negative results do not preclude SARS-CoV-2 infection, do not rule out co-infections with other pathogens, and should not be used as the sole basis for treatment or other patient management decisions. Negative results must be combined with clinical observations, patient history, and epidemiological information. The expected result is Negative. Fact Sheet for Patients: SugarRoll.be Fact Sheet for Healthcare Providers: https://www.woods-mathews.com/ This test is not yet approved or cleared by the [place] FDA and  has been authorized for detection and/or diagnosis of SARS-CoV-2 by FDA under an Emergency Use Authorization (EUA). This EUA will remain  in effect (meaning this test can be used) for the duration of the COVID-19 declaration under Section 56 4(b)(1) of the Act, 21 U.S.C. section 360bbb-3(b)(1), unless the authorization is terminated or revoked sooner. Performed at [facility], [place] [address] [place], [place] [address]     Radiology Reports DG Chest 2 View  Result Date: 04/26/2019 CLINICAL DATA:  Dyspnea EXAM: CHEST - 2 VIEW  COMPARISON:  04/23/2019 FINDINGS: No significant change in diffuse bilateral interstitial pulmonary opacity. No new or focal airspace opacity. The heart mediastinum are unremarkable. Gas-filled bowel in the upper abdomen. IMPRESSION: No significant change in diffuse bilateral interstitial pulmonary opacity, consistent with edema or infection. No new or focal airspace opacity. Electronically Signed   By: Eddie Candle M.D.   On: 04/26/2019 10:12   CT Angio Chest PE W and/or Wo Contrast  Result Date: 04/20/2019 CLINICAL DATA:  Shortness of breath. EXAM: CT ANGIOGRAPHY CHEST WITH CONTRAST TECHNIQUE: Multidetector CT imaging of the chest was performed using the standard protocol during bolus administration of intravenous contrast. Multiplanar CT image reconstructions and MIPs were obtained to evaluate the vascular anatomy. CONTRAST:  171mL OMNIPAQUE IOHEXOL 350 MG/ML SOLN COMPARISON:  None. FINDINGS: Cardiovascular: Satisfactory opacification of the pulmonary arteries to the segmental level. No evidence of pulmonary embolism. Normal heart size. No pericardial effusion. Atherosclerosis of thoracic aorta is noted without aneurysm or dissection. Mediastinum/Nodes: Thyroid gland and esophagus are unremarkable. Enlarged pre-vascular a subcarinal adenopathy is noted which most likely is inflammatory in etiology. Lungs/Pleura: No pneumothorax is noted. Minimal bilateral pleural effusions are noted. Multiple airspace opacities are noted throughout both lungs most consistent with multifocal pneumonia, most likely of viral etiology. Upper Abdomen: No acute abnormality. Musculoskeletal: No chest wall abnormality. No acute or significant osseous findings. Review of the MIP images confirms the above findings. IMPRESSION: 1. No definite evidence of pulmonary embolus. 2. Multiple airspace opacities are noted throughout both lungs most consistent with multifocal pneumonia, most likely of viral etiology. 3. Minimal bilateral pleural  effusions are noted. 4. Enlarged mediastinal adenopathy is noted which most likely is inflammatory in etiology. Aortic Atherosclerosis (ICD10-I70.0). Electronically Signed   By: Marijo Conception M.D.   On: 04/20/2019 10:54   US Venous Img Lower Unilateral Right  Result Date: 04/20/2019 CLINICAL DATA:  Swelling x5 days EXAM: RIGHT LOWER EXTREMITY VENOUS DOPPLER ULTRASOUND TECHNIQUE: Gray-scale sonography with compression, as well as color and duplex ultrasound, were performed to evaluate the deep venous system(s) from the level of the common femoral vein through the popliteal and proximal  calf veins. COMPARISON:  None. FINDINGS: VENOUS Normal compressibility of the common femoral, superficial femoral, and popliteal veins, as well as the visualized calf veins. Visualized portions of profunda femoral vein and great saphenous vein unremarkable. No filling defects to suggest DVT on grayscale or color Doppler imaging. Doppler waveforms show normal direction of venous flow, normal respiratory phasicity and response to augmentation. Limited views of the contralateral common femoral vein are unremarkable. OTHER Subcutaneous calf edema. Limitations: none IMPRESSION: No femoropopliteal DVT nor evidence of DVT within the visualized calf veins. If clinical symptoms are inconsistent or if there are persistent or worsening symptoms, further imaging (possibly involving the iliac veins) may be warranted. Electronically Signed   By: Lucrezia Europe M.D.   On: 04/20/2019 10:33   DG Chest Port 1 View  Result Date: 04/23/2019 CLINICAL DATA:  Short of breath for 1 week, tobacco abuse EXAM: PORTABLE CHEST 1 VIEW COMPARISON:  04/20/2019 at 8:53 a.m. FINDINGS: Single frontal view of the chest demonstrates a stable cardiac silhouette. Persistent multifocal bilateral ground-glass airspace disease unchanged. No effusion or pneumothorax. No acute bony abnormality. IMPRESSION: 1. Stable multifocal bilateral ground-glass airspace disease most  consistent with atypical viral pneumonia such as COVID-19. Electronically Signed   By: Randa Ngo M.D.   On: 04/23/2019 21:44   DG Chest Portable 1 View  Result Date: 04/20/2019 CLINICAL DATA:  Shortness of breath, history coronary artery disease, hypertension, type II diabetes mellitus, COPD, smoker EXAM: PORTABLE CHEST 1 VIEW COMPARISON:  Portable exam 0853 hours compared to 01/28/2019 FINDINGS: Lordotic positioning. Enlargement of cardiac silhouette. Mediastinal contours normal. Patchy BILATERAL pulmonary infiltrates consistent with multifocal pneumonia. No pleural effusion or pneumothorax. No acute osseous findings. IMPRESSION: Patchy BILATERAL pulmonary infiltrates consistent with multifocal pneumonia; question of COVID-19 pneumonia is raised. Electronically Signed   By: Lavonia Dana M.D.   On: 04/20/2019 09:00   ECHOCARDIOGRAM COMPLETE  Result Date: 04/20/2019    ECHOCARDIOGRAM REPORT   Patient Name:   HULICES TINDELL Date of Exam: 04/20/2019 Medical Rec #:  [id number]       Height:       67.0 in Accession #:    [id number]      Weight:       198.4 lb Date of Birth:  [birth year]      BSA:          2.015 m Patient Age:    73 years        BP:           119/65 mmHg Patient Gender: M               HR:           104 bpm. Exam Location:  [facility] Procedure: 2D Echo Indications:    Dyspnea 786.09 / R06.00  History:        Patient has prior history of Echocardiogram examinations, most                 recent 07/03/2016. CAD, COPD; Risk Factors:Diabetes, Dyslipidemia                 and Hypertension. Acute respiratory failure with hypoxia ,                 Peripheral arterial disease.  Sonographer:    Leavy Cella RDCS (AE) Referring Phys: [place]  1. Left ventricular ejection fraction, by estimation, is 50 to 55%. The left ventricle has low normal function. The left ventricle  has no regional wall motion abnormalities. Indeterminate diastolic filling due to E-A fusion. Elevated left  ventricular end-diastolic pressure.  2. Right ventricular systolic function is mildly reduced. The right ventricular size is normal. There is moderately elevated pulmonary artery systolic pressure.  3. Left atrial size was moderately dilated.  4. The mitral valve is grossly normal. Mild mitral valve regurgitation.  5. The aortic valve is tricuspid. Aortic valve regurgitation is not visualized. No aortic stenosis is present.  6. The inferior vena cava is dilated in size with >50% respiratory variability, suggesting right atrial pressure of 8 mmHg. FINDINGS  Left Ventricle: Left ventricular ejection fraction, by estimation, is 50 to 55%. The left ventricle has low normal function. The left ventricle has no regional wall motion abnormalities. The left ventricular internal cavity size was normal in size. There is no left ventricular hypertrophy. Indeterminate diastolic filling due to E-A fusion. Elevated left ventricular end-diastolic pressure. Right Ventricle: The right ventricular size is normal. No increase in right ventricular wall thickness. Right ventricular systolic function is mildly reduced. There is moderately elevated pulmonary artery systolic pressure. The tricuspid regurgitant velocity is 3.30 m/s, and with an assumed right atrial pressure of 10 mmHg, the estimated right ventricular systolic pressure is [id number] mmHg. Left Atrium: Left atrial size was moderately dilated. Right Atrium: Right atrial size was normal in size. Pericardium: There is no evidence of pericardial effusion. Mitral Valve: The mitral valve is grossly normal. Mild mitral valve regurgitation. Tricuspid Valve: The tricuspid valve is grossly normal. Tricuspid valve regurgitation is mild. Aortic Valve: The aortic valve is tricuspid. . There is mild thickening of the aortic valve. Aortic valve regurgitation is not visualized. No aortic stenosis is present. There is mild thickening of the aortic valve. Pulmonic Valve: The pulmonic valve was  grossly normal. Pulmonic valve regurgitation is not visualized. Aorta: The aortic root is normal in size and structure. Venous: The inferior vena cava is dilated in size with greater than 50% respiratory variability, suggesting right atrial pressure of 8 mmHg. IAS/Shunts: No atrial level shunt detected by color flow Doppler.  LEFT VENTRICLE PLAX 2D LVIDd:         5.04 cm  Diastology LVIDs:         4.45 cm  LV e' lateral:   8.16 cm/s LV PW:         1.28 cm  LV E/e' lateral: 10.7 LV IVS:        0.86 cm  LV e' medial:    4.03 cm/s LVOT diam:     2.10 cm  LV E/e' medial:  21.7 LVOT Area:     3.46 cm  RIGHT VENTRICLE RV S prime:     6.31 cm/s TAPSE (M-mode): 1.1 cm LEFT ATRIUM             Index       RIGHT ATRIUM           Index LA diam:        5.20 cm 2.58 cm/m  RA Area:     22.20 cm LA Vol (A2C):   68.3 ml 33.89 ml/m RA Volume:   78.40 ml  38.90 ml/m LA Vol (A4C):   80.0 ml 39.69 ml/m LA Biplane Vol: 80.7 ml 40.04 ml/m   AORTA Ao Root diam: 3.20 cm MITRAL VALVE               TRICUSPID VALVE MV Area (PHT): 4.60 cm    TR Peak grad:   43.6 mmHg  MV Decel Time: 165 msec    TR Vmax:        330.00 cm/s MR Peak grad: 60.2 mmHg MR Mean grad: 42.0 mmHg    SHUNTS MR Vmax:      388.00 cm/s  Systemic Diam: 2.10 cm MR Vmean:     307.0 cm/s MV E velocity: 87.30 cm/s MV A velocity: 39.80 cm/s MV E/A ratio:  2.19 Kate Sable MD Electronically signed by Kate Sable MD Signature Date/Time: 04/20/2019/3:05:50 PM    Final      CBC Recent Labs  Lab 04/21/19 0401 04/22/19 0426 04/23/19 2037 04/24/19 0151 04/26/19 0532  WBC 11.0* 17.7* 15.3* 14.2* 15.5*  HGB 8.9* 9.1* 9.7* 9.2* 10.3*  HCT 28.9* 29.4* 31.6* 29.6* 32.9*  PLT 266 342 372 353 397  MCV 89.8 87.2 88.3 87.8 86.6  MCH 27.6 27.0 27.1 27.3 27.1  MCHC 30.8 31.0 30.7 31.1 31.3  RDW 15.9* 15.9* 15.9* 15.9* 16.1*  LYMPHSABS  --   --  0.5*  --   --   MONOABS  --   --  0.8  --   --   EOSABS  --   --  0.0  --   --   BASOSABS  --   --  0.0  --   --      Chemistries  Recent Labs  Lab 04/20/19 1455 04/21/19 0401 04/22/19 0426 04/22/19 0841 04/23/19 0544 04/23/19 2037 04/24/19 0151 04/26/19 0532 04/27/19 0409  NA  --    < >   < >  --  136 136 138 134* 136  K  --    < >   < >  --  4.1 4.3 4.2 4.3 4.1  CL  --    < >   < >  --  103 102 106 99 97*  CO2  --    < >   < >  --  21* 21* 22 24 27   GLUCOSE  --    < >   < >  --  224* 269* 181* 227* 110*  BUN  --    < >   < >  --  41* 45* 45* 49* 50*  CREATININE  --    < >   < >  --  1.54* 1.82* 1.58* 1.21 1.27*  CALCIUM  --    < >   < >  --  8.6* 8.5* 8.4* 8.6* 8.9  MG 1.8  --   --  2.2 2.2  --   --   --   --   AST  --   --   --   --   --  28  --   --   --   ALT  --   --   --   --   --  46*  --   --   --   ALKPHOS  --   --   --   --   --  85  --   --   --   BILITOT  --   --   --   --   --  0.6  --   --   --    < > = values in this interval not displayed.   ------------------------------------------------------------------------------------------------------------------ No results for input(s): CHOL, HDL, LDLCALC, TRIG, CHOLHDL, LDLDIRECT in the last 72 hours.  Lab Results  Component Value Date   HGBA1C 7.6 (H) 04/20/2019   ------------------------------------------------------------------------------------------------------------------ No results for input(s): TSH,  T4TOTAL, T3FREE, THYROIDAB in the last 72 hours.  Invalid input(s): FREET3 ------------------------------------------------------------------------------------------------------------------ No results for input(s): VITAMINB12, FOLATE, FERRITIN, TIBC, IRON, RETICCTPCT in the last 72 hours.  Coagulation profile No results for input(s): INR, PROTIME in the last 168 hours.  No results for input(s): DDIMER in the last 72 hours.  Cardiac Enzymes No results for input(s): CKMB, TROPONINI, MYOGLOBIN in the last 168 hours.  Invalid input(s):  CK ------------------------------------------------------------------------------------------------------------------    Component Value Date/Time   BNP 793.0 (H) 04/23/2019 2037     Roxan Hockey M.D on 04/27/2019 at 12:24 PM  Go to [email or web address] - for contact info  [facility] - Office  [phone]

## 2019-04-28 ENCOUNTER — Inpatient Hospital Stay (HOSPITAL_COMMUNITY): Payer: PPO

## 2019-04-28 LAB — BASIC METABOLIC PANEL
Anion gap: 10 (ref 5–15)
BUN: 59 mg/dL — ABNORMAL HIGH (ref 8–23)
CO2: 26 mmol/L (ref 22–32)
Calcium: 8.6 mg/dL — ABNORMAL LOW (ref 8.9–10.3)
Chloride: 97 mmol/L — ABNORMAL LOW (ref 98–111)
Creatinine, Ser: 1.49 mg/dL — ABNORMAL HIGH (ref 0.61–1.24)
GFR calc Af Amer: 52 mL/min — ABNORMAL LOW (ref >60)
GFR calc non Af Amer: 45 mL/min — ABNORMAL LOW (ref >60)
Glucose, Bld: 184 mg/dL — ABNORMAL HIGH (ref 70–99)
Potassium: 4.7 mmol/L (ref 3.5–5.1)
Sodium: 133 mmol/L — ABNORMAL LOW (ref 135–145)

## 2019-04-28 LAB — GLUCOSE, CAPILLARY
Glucose-Capillary: 220 mg/dL — ABNORMAL HIGH (ref 70–99)
Glucose-Capillary: 349 mg/dL — ABNORMAL HIGH (ref 70–99)

## 2019-04-28 LAB — MAGNESIUM: Magnesium: 2.6 mg/dL — ABNORMAL HIGH (ref 1.7–2.4)

## 2019-04-28 IMAGING — DX DG CHEST 2V
2 series · 2 of 2 positions shown · non-contrast
Comparison: Portable exam [NS] hours compared to [DATE]

CLINICAL DATA: Shortness of breath, diabetes mellitus,
hypertension, coronary artery disease post MI and stent placement,
smoker, tested COVID negative on [DATE]

EXAM:
CHEST - 2 VIEW

[chest pa]
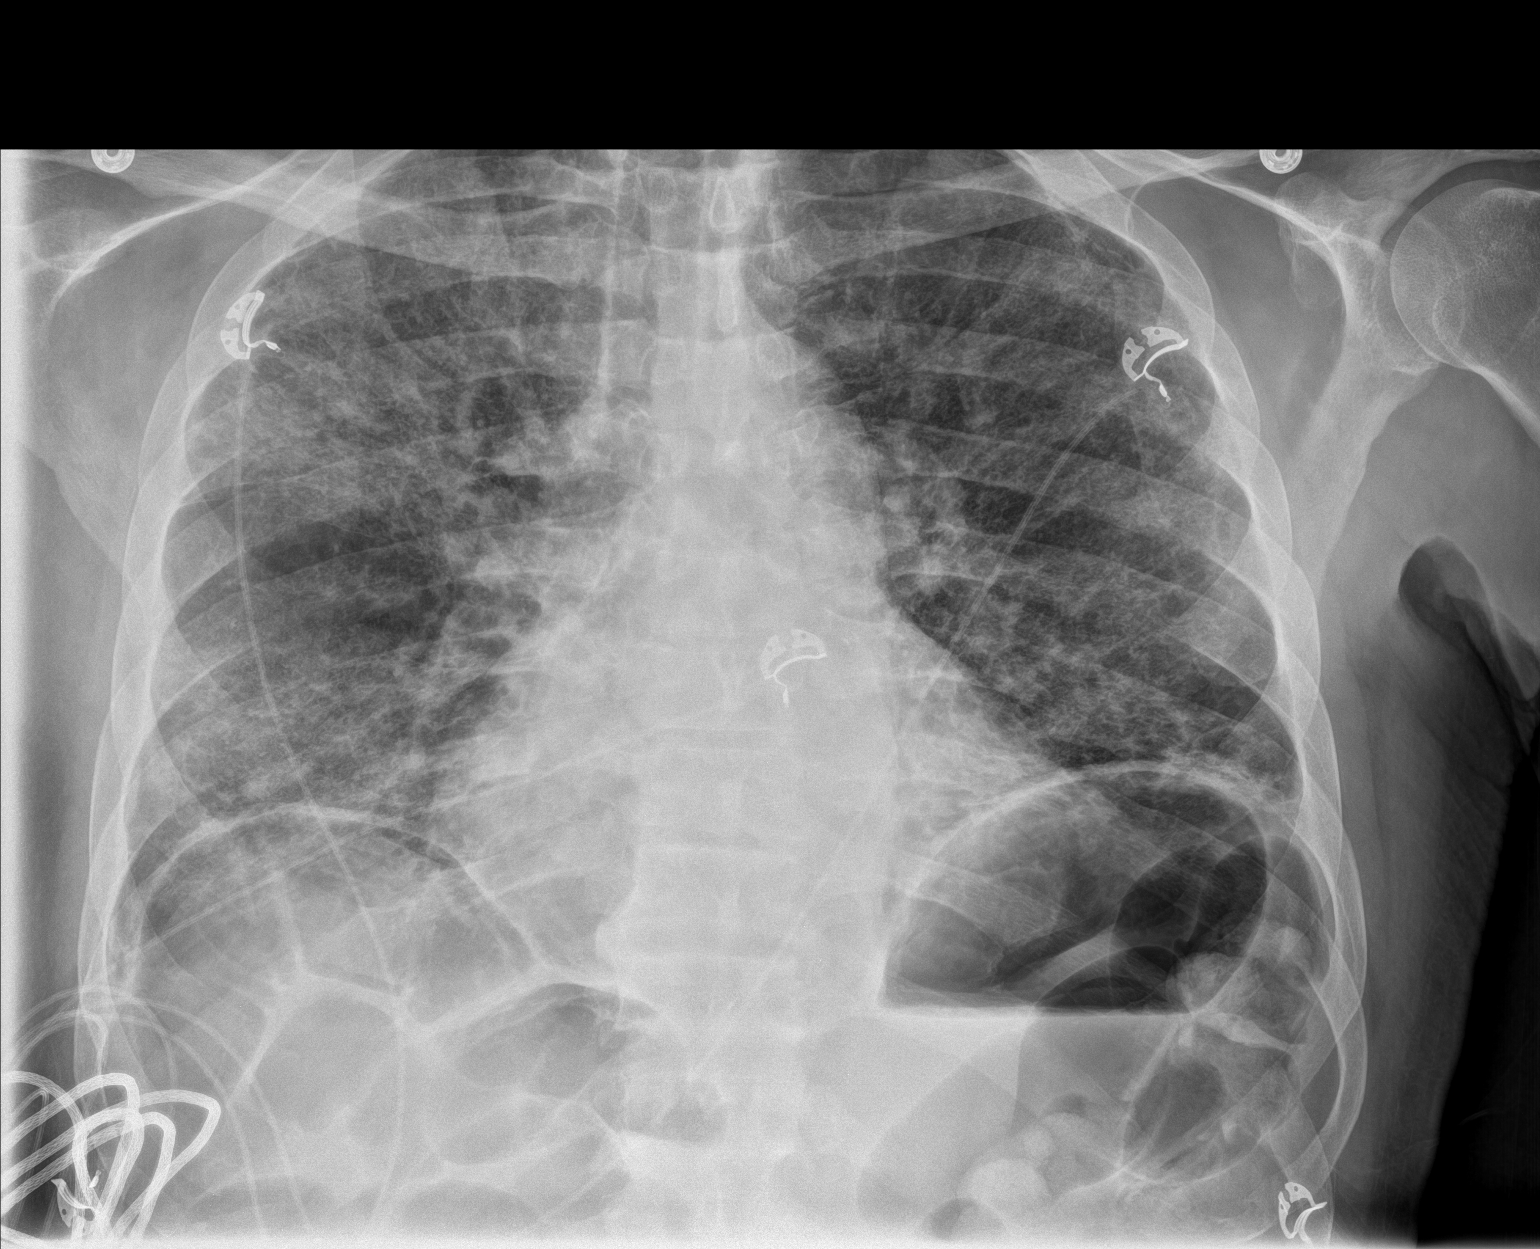

[chest lat]
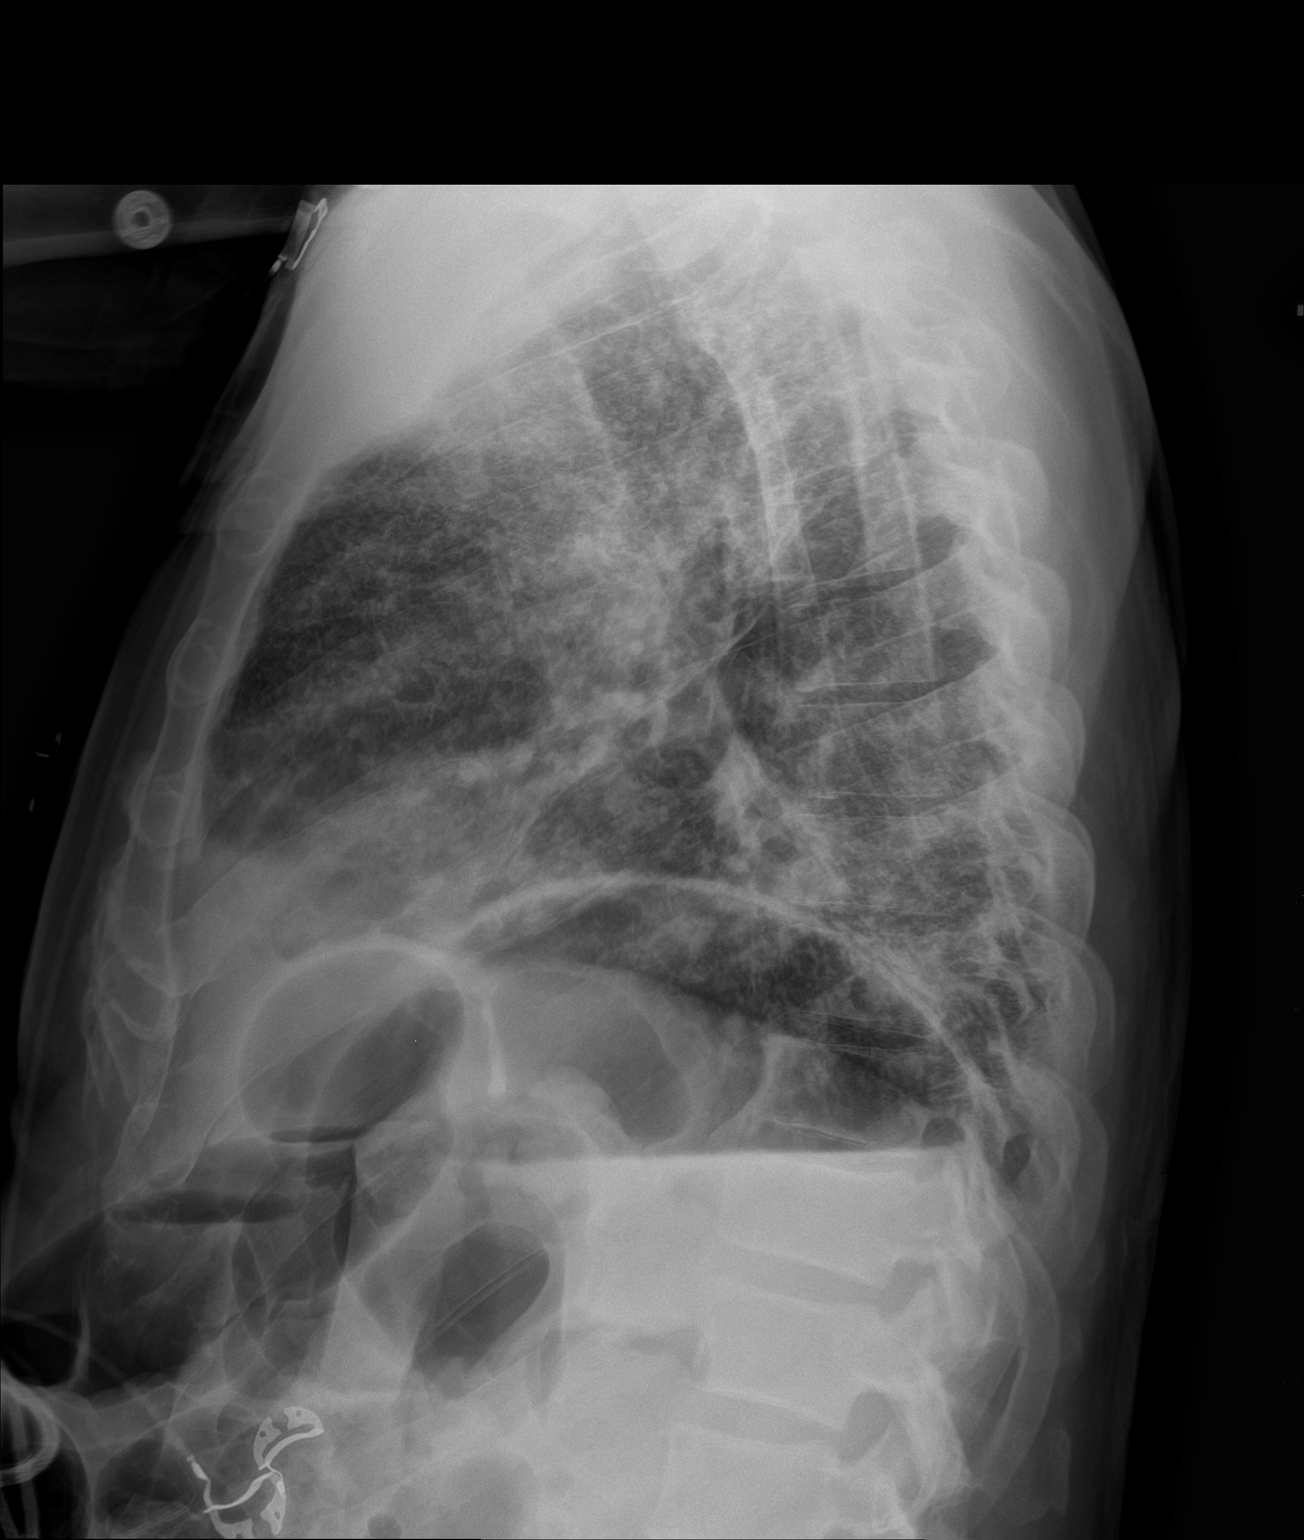

[2 of 2 positions shown; findings below may reference images not displayed]

FINDINGS: Borderline enlargement of cardiac silhouette.

Mediastinal contours normal.

Diffuse BILATERAL pulmonary infiltrates favor multifocal pneumonia
less likely edema.

Infiltrates appear minimally improved since previous exam.

No pleural effusion or pneumothorax.

Bowel interposition between liver and diaphragm.
IMPRESSION: Slightly improved pulmonary infiltrates question multifocal
infection versus edema.

## 2019-04-28 MED ORDER — ASPIRIN EC 81 MG PO TBEC: 81.0000 mg | DELAYED_RELEASE_TABLET | Freq: Every day | ORAL | 2 refills | Status: DC

## 2019-04-28 MED ORDER — METOLAZONE 2.5 MG PO TABS: 2.5000 mg | ORAL_TABLET | ORAL | 3 refills | Status: DC

## 2019-04-28 MED ORDER — CLOPIDOGREL BISULFATE 75 MG PO TABS: 75.0000 mg | ORAL_TABLET | Freq: Every day | ORAL | 3 refills | Status: DC

## 2019-04-28 MED ORDER — METOPROLOL TARTRATE 25 MG PO TABS: 12.5000 mg | ORAL_TABLET | Freq: Two times a day (BID) | ORAL | 2 refills | Status: DC

## 2019-04-28 MED ORDER — POTASSIUM CHLORIDE CRYS ER 20 MEQ PO TBCR: 20.0000 meq | EXTENDED_RELEASE_TABLET | Freq: Every day | ORAL | 2 refills | Status: DC

## 2019-04-28 MED ORDER — PREDNISONE 50 MG PO TABS: 50.0000 mg | ORAL_TABLET | Freq: Every day | ORAL | 0 refills | Status: AC

## 2019-04-28 MED ORDER — ATORVASTATIN CALCIUM 20 MG PO TABS: 20.0000 mg | ORAL_TABLET | Freq: Every day | ORAL | 3 refills | Status: DC

## 2019-04-28 MED ORDER — ALBUTEROL SULFATE HFA 108 (90 BASE) MCG/ACT IN AERS: 2.0000 | INHALATION_SPRAY | RESPIRATORY_TRACT | 3 refills | Status: DC | PRN

## 2019-04-28 MED ORDER — POLYETHYLENE GLYCOL 3350 17 G PO PACK: 17.0000 g | PACK | Freq: Every day | ORAL | 5 refills | Status: DC

## 2019-04-28 MED ORDER — FUROSEMIDE 20 MG PO TABS: 20.0000 mg | ORAL_TABLET | Freq: Two times a day (BID) | ORAL | 3 refills | Status: DC

## 2019-04-28 MED ORDER — FERROUS SULFATE 325 (65 FE) MG PO TABS: 325.0000 mg | ORAL_TABLET | Freq: Three times a day (TID) | ORAL | 11 refills | Status: DC

## 2019-04-28 MED ORDER — ACETAMINOPHEN 325 MG PO TABS: 650.0000 mg | ORAL_TABLET | Freq: Four times a day (QID) | ORAL | 0 refills | Status: DC | PRN

## 2019-04-28 MED ORDER — GUAIFENESIN ER 600 MG PO TB12: 600.0000 mg | ORAL_TABLET | Freq: Two times a day (BID) | ORAL | 0 refills | Status: DC

## 2019-04-28 NOTE — TOC Transition Note (Signed)
Transition of Care [facility]) - CM/SW Discharge Note   Patient Details  Name: Peter Lambert MRN: [id number] Date of Birth: 09/09/1945  Transition of Care [facility]) CM/SW Contact:  Shade Flood, LCSW Phone Number: 04/28/2019, 12:33 PM   Clinical Narrative:     Pt stable for dc today per MD. Pt will have [place] RN and PT with [facility]. [employer] will provide for pt's home O2 needs.  There are no other TOC needs for dc.  Final next level of care: [place] Barriers to Discharge: Barriers Resolved   Patient Goals and CMS Choice Patient states their goals for this hospitalization and ongoing recovery are:: go home as soon as possible CMS Medicare.gov Compare Post Acute Care list provided to:: Patient Choice offered to / list presented to : Patient  Discharge Placement                       Discharge Plan and Services In-house Referral: Clinical Social Work   Post Acute Care Choice: Home Health                               Social Determinants of Health (SDOH) Interventions     Readmission Risk Interventions Readmission Risk Prevention Plan 04/28/2019 04/27/2019  Transportation Screening - Complete  Home Care Screening - Complete  Medication Review (RN CM) - Complete  HRI or Home Care Consult Complete -  Social Work Consult for [place] Planning/Counseling Complete -  Palliative Care Screening Not Applicable -  Medication Review [employer]) Complete -  Some recent data might be hidden

## 2019-04-28 NOTE — Progress Notes (Signed)
Physical Therapy Treatment Patient Details Name: Peter Lambert MRN: [id number] DOB: 12-14-1945 Today's Date: 04/28/2019    History of Present Illness Peter Lambert is a 74 y.o. male with a past medical history significant for tobacco abuse, hypertension, hyperlipidemia, history of coronary disease/myocardial infarction in the past, type 2 diabetes mellitus and gastroesophageal reflux disease; who presented to the hospital secondary to approximately 7-8 days of worsening shortness of breath.  Patient has been seen at the office of his PCP where he received 2 shots for what appears to be COPD exacerbation without significant improvement and continues worsening in his breathing status.  Patient reports some lower extremity edema, orthopnea and short of breath on exertion.  He expressed increased wheezing and no response to his home bronchodilator inhaler.  Patient denies any fever, chills, rhinorrhea, chest pain, abdominal pain, dysuria, hematuria, melena, hematochezia, headaches, focal weakness or any other complaints.In the ED he was found to be hypoxic in the high 80s on room air and very tachypneic with diffuse wheezing.  Covid test was negative.    PT Comments    Patient able to transition to standing without assist and ambulate without use of AD. O2 monitored during session, while ambulating on 2L O2 sat between 88-92%. Patient able to ambulate increased distance today with minimal SOB. Patient educated on pursed lip breathing technique with good carry over following cueing. Patient will benefit from continued physical therapy in hospital and recommended venue below to increase strength, balance, endurance for safe ADLs and gait.   Follow Up Recommendations  Home health PT;Supervision - Intermittent     Equipment Recommendations  None recommended by PT    Recommendations for Other Services       Precautions / Restrictions Precautions Precautions: Fall Restrictions Weight Bearing  Restrictions: No    Mobility  Bed Mobility               General bed mobility comments: seated in bed upon enterance for session  Transfers Overall transfer level: Needs assistance Equipment used: None Transfers: Sit to/from Stand;Stand Pivot Transfers Sit to Stand: Supervision Stand pivot transfers: Supervision       General transfer comment: to transition to standing without AD  Ambulation/Gait Ambulation/Gait assistance: Min guard Gait Distance (Feet): 140 Feet Assistive device: None Gait Pattern/deviations: Step-through pattern;Decreased step length - right;Decreased step length - left;Decreased stride length Gait velocity: decreased   General Gait Details: slightly slow, labored, min SOB, O2 sat between 88-92% while ambulating on 2L, some unsteadiness   Stairs             Wheelchair Mobility    Modified Rankin (Stroke Patients Only)       Balance Overall balance assessment: Needs assistance Sitting-balance support: No upper extremity supported;Feet supported Sitting balance-Leahy Scale: Normal Sitting balance - Comments: seated edge of chair   Standing balance support: No upper extremity supported;During functional activity Standing balance-Leahy Scale: Fair Standing balance comment: fair without AD while ambulating                            Cognition Arousal/Alertness: Awake/alert Behavior During Therapy: WFL for tasks assessed/performed Overall Cognitive Status: Within Functional Limits for tasks assessed                                        Exercises  General Comments        Pertinent Vitals/Pain Pain Assessment: No/denies pain    Home Living                      Prior Function            PT Goals (current goals can now be found in the care plan section) Acute Rehab PT Goals Patient Stated Goal: Return home PT Goal Formulation: With patient Time For Goal Achievement:  05/08/19 Potential to Achieve Goals: Good Progress towards PT goals: Progressing toward goals    Frequency    Min 3X/week      PT Plan Current plan remains appropriate    Co-evaluation              AM-PAC PT "6 Clicks" Mobility   Outcome Measure  Help needed turning from your back to your side while in a flat bed without using bedrails?: None Help needed moving from lying on your back to sitting on the side of a flat bed without using bedrails?: None Help needed moving to and from a bed to a chair (including a wheelchair)?: None Help needed standing up from a chair using your arms (e.g., wheelchair or bedside chair)?: None Help needed to walk in hospital room?: A Little Help needed climbing 3-5 steps with a railing? : A Little 6 Click Score: 22    End of Session Equipment Utilized During Treatment: Oxygen Activity Tolerance: Patient tolerated treatment well Patient left: with call bell/phone within reach;in bed Nurse Communication: Mobility status PT Visit Diagnosis: Unsteadiness on feet (R26.81);Other abnormalities of gait and mobility (R26.89);Muscle weakness (generalized) (M62.81)     Time: [id number] PT Time Calculation (min) (ACUTE ONLY): 12 min  Charges:  $Therapeutic Activity: 8-22 mins                     11:23 AM, 04/28/19 Mearl Latin PT, DPT Physical Therapist at [facility]

## 2019-04-28 NOTE — Discharge Instructions (Signed)
1)Very low-salt diet advised 2)Weigh yourself daily, call if you gain more than 3 pounds in 1 day or more than 5 pounds in 1 week as your diuretic medications may need to be adjusted 3)Limit your Fluid  intake to no more than 60 ounces (1.8 Liters) per day 4)Avoid ibuprofen/Advil/Aleve/Motrin/Goody Powders/Naproxen/BC powders/Meloxicam/Diclofenac/Indomethacin and other Nonsteroidal anti-inflammatory medications as these will make you more likely to bleed and can cause stomach ulcers, can also cause Kidney problems.  5)Keep Legs Elevated and use TEDs stockings (put them on in the morning and take them off when going to bed) 6) please take your medications exactly as prescribed 7) please use oxygen at 2 L/min via nasal cannula when you are resting and and up  to 3 L/min when you are walking around or active (Patient Saturations on2Liters of oxygen while Ambulating = 88 to 92 % -On 3 L of oxygen via nasal cannula patient stayed above 90% with ambulation) Patient needs continuous O2 at 2 L/min continuously via nasal cannula with humidifier, with gaseous portability and conserving device  (patient may use 2 L of oxygen via nasal cannula at rest, may use up to 3 L of oxygen via nasal cannula with activity and ambulation) 8) please follow-up with your primary care physician within a week for repeat CBC and BMP blood test

## 2019-04-28 NOTE — Progress Notes (Signed)
     SATURATION QUALIFICATIONS: (Thisnote is usedto comply with regulatory documentation for home oxygen)  Patient Saturations on Room Air at Rest =84 %  Patient Saturations on Room Air while Ambulating =81 %  Patient Saturations on2Liters of oxygen while Ambulating = 88 to 92 % -On 3 L of oxygen via nasal cannula patient stayed above 90% with ambulation   Patient needs continuous O2 at 2 L/min continuously via nasal cannula with humidifier, with gaseous portability and conserving device  (patient may use 2 L of oxygen via nasal cannula at rest, may use up to 3 L of oxygen via nasal cannula with activity and ambulation)

## 2019-04-28 NOTE — Discharge Summary (Signed)
Peter Lambert, is a 74 y.o. male  DOB August 04, 1945  MRN [id number].  Admission date:  04/23/2019  Admitting Physician  Lynetta Mare, MD  Discharge Date:  04/28/2019   Primary MD  Redmond School, MD  Recommendations for primary care physician for things to follow:   1)Very low-salt diet advised 2)Weigh yourself daily, call if you gain more than 3 pounds in 1 day or more than 5 pounds in 1 week as your diuretic medications may need to be adjusted 3)Limit your Fluid  intake to no more than 60 ounces (1.8 Liters) per day 4)Avoid ibuprofen/Advil/Aleve/Motrin/Goody Powders/Naproxen/BC powders/Meloxicam/Diclofenac/Indomethacin and other Nonsteroidal anti-inflammatory medications as these will make you more likely to bleed and can cause stomach ulcers, can also cause Kidney problems.  5)Keep Legs Elevated and use TEDs stockings (put them on in the morning and take them off when going to bed) 6) please take your medications exactly as prescribed 7) please use oxygen at 2 L/min via nasal cannula when you are resting and and up  to 3 L/min when you are walking around or active (Patient Saturations on2Liters of oxygen while Ambulating = 88 to 92 % -On 3 L of oxygen via nasal cannula patient stayed above 90% with ambulation) Patient needs continuous O2 at 2 L/min continuously via nasal cannula with humidifier, with gaseous portability and conserving device  (patient may use 2 L of oxygen via nasal cannula at rest, may use up to 3 L of oxygen via nasal cannula with activity and ambulation) 8) please follow-up with your primary care physician within a week for repeat CBC and BMP blood test  Admission Diagnosis  SOB (shortness of breath) [R06.02] Hypoxia [R09.02] Acute on chronic respiratory failure with hypoxia (HCC) [J96.21] Multifocal pneumonia [J18.9]   Discharge Diagnosis  SOB (shortness of breath)  [R06.02] Hypoxia [R09.02] Acute on chronic respiratory failure with hypoxia (HCC) [J96.21] Multifocal pneumonia [J18.9]    Principal Problem:   Acute respiratory failure with hypoxemia (HCC) Active Problems:   Essential hypertension   COPD exacerbation (HCC)   Multifocal pneumonia   Atrial flutter (HCC)   Coronary artery disease   Hyperlipidemia   Tobacco use   Acute on chronic respiratory failure with hypoxia (HCC)      Past Medical History:  Diagnosis Date  . Coronary artery disease    a. s/p NSTEMI in 2010 with DES to proximal LAD with D1 jailed and angioplasty alone to ostium  . Hyperlipidemia   . Hypertension   . Hypertension   . MI, old   . Peripheral arterial disease ([place])   . Tobacco abuse   . Type 2 diabetes mellitus ([place])     Past Surgical History:  Procedure Laterality Date  . CARDIAC SURGERY    . CORONARY STENT PLACEMENT         HPI  from the history and physical done on the day of admission:    HPI: Peter Lambert is a 74 y.o. male with medical history significant of tobacco abuse, hypertension,  hyperlipidemia, history of coronary disease/myocardial infarction in the past, type 2 diabetes mellitus and gastroesophageal reflux disease who returned to the ER with worsening shortness of breath.  Patient was admitted to the hospital for hypoxemia, respiratory failure, pneumonia, CHF exacerbation, COPD on 04/20/2019 I decided to sign out Peter Lambert earlier in the day on 04/23/2019 at 1 PM.  He had been hypoxic and was given home oxygen even though he had signed out Peter Lambert.  He states he never received oxygen at home.  He continued to have shortness of breath and decided to come back to the ER. ED Course:  Vital Signs reviewed on presentation, significant for temperature 97.6, heart rate 94, blood pressure 111/88, saturation 88% room air. Labs reviewed, significant for sodium 136, potassium 4.3, BUN 45, creatinine 1.82, LFTs within normal.   BNP 793, troponin 335, WBC 14.3, hemoglobin 9.7, hematocrit 31 Imaging personally Reviewed, chest x-ray shows stable multifocal bilateral groundglass opacity EKG personally reviewed, shows sinus rhythm, ST depressions in lateral leads, similar to recent admission   Hospital Course:   Brief Summary:- 74 y.o.malewith medical history significant oftobacco abuse (quit 02/2019), HTN, HLD, COPD , H/o CAD, prior MI,  DM2 and GERD admitted on 04/23/2019 with acute hypoxemia, respiratory failure, pneumonia with acute COPD Exacerbation, acute CHF exacerbation, COPD  -Oxygen requirement improved, at the time of discharge patient requires 2 L of oxygen at rest and up to 3 L of oxygen via nasal cannula with activity  A/p  1) acute hypoxic respiratory failure--- secondary to combination of CHF, pneumonia and COPD exacerbation--- --patient was initially seen in the ED on 04/20/2019, and sent home on home O2 after leaving AMA -Readmitted 04/23/2019 as above for same -Required 5 to 7 L of oxygen via nasal cannula initially --Oxygen requirement improved, at the time of discharge patient requires 2 L of oxygen at rest and up to 3 L of oxygen via nasal cannula with activity  2) acute COPD exacerbation--- treated with IV Solu-Medrol, mucolytics, bronchodilators, antibiotics as below #4 as prescribed -Patient quit smoking in January 2021 -Dyspnea at rest and with exertion improved significantly -Okay to discharge on prednisone 50 mg daily for the next 5 days  3)HFpEF--Echo with EF is 50 to 55%, , acute on chronic diastolic CHF exacerbation,  -Much improved lower extremity edema, resolved orthopnea and much improved dyspnea on exertion after diuresis -Soft blood pressure makes it difficult to use high-dose  diuretics  -Discharge on Lasix 20 mg twice daily along with metolazone 2.5 mg Mondays and Fridays only -Daily weight chart noted -Wt 175 Lb>>181>>165.9>>161.8 -Fluid balance continues to be negative   --Oxygen requirement improved, at the time of discharge patient requires 2 L of oxygen at rest and up to 3 L of oxygen via nasal cannula with activity -Repeat chest x-ray on 04/28/2019 shows improvement  4) community-acquired pneumonia--- overall much improved from a respiratory standpoint Completed IV Rocephin / azithromycin -last dose 04/27/2019 -No further antibiotics needed -Continue mucolytics bronchodilators as ordered  5)H/o CAD--stable, continue aspirin, Plavix and Lipitor -Metoprolol 12.5 mg twice daily as ordered  6)Generalized Weakness and Deconditioning----PT eval appreciated recommends home health therapy  Disposition/-discharge home with home health PT and RN as well as home health oxygen  Code Status : Full  Family Communication:   (patient is alert, awake and coherent) Discussed with son Peter Lambert by phone Consults  :  na  Discharge Condition: Stable  Follow UP--- PCP for repeat CBC and BMP within a week  Follow-up Information    [facility] Follow up.   Why: oxygen provider [phone]          Diet and Activity recommendation:  As advised  Discharge Instructions    Discharge Instructions    Call MD for:  difficulty breathing, headache or visual disturbances   Complete by: As directed    Call MD for:  extreme fatigue   Complete by: As directed    Call MD for:  persistant dizziness or light-headedness   Complete by: As directed    Call MD for:  persistant nausea and vomiting   Complete by: As directed    Call MD for:  severe uncontrolled pain   Complete by: As directed    Call MD for:  temperature >100.4   Complete by: As directed    Diet - low sodium heart healthy   Complete by: As directed    Discharge instructions   Complete by: As directed    1)Very low-salt diet advised 2)Weigh yourself daily, call if you gain more than 3 pounds in 1 day or more than 5 pounds in 1 week as your diuretic medications may need to be adjusted 3)Limit  your Fluid  intake to no more than 60 ounces (1.8 Liters) per day 4)Avoid ibuprofen/Advil/Aleve/Motrin/Goody Powders/Naproxen/BC powders/Meloxicam/Diclofenac/Indomethacin and other Nonsteroidal anti-inflammatory medications as these will make you more likely to bleed and can cause stomach ulcers, can also cause Kidney problems.  5)Keep Legs Elevated and use TEDs stockings (put them on in the morning and take them off when going to bed) 6) please take your medications exactly as prescribed 7) please use oxygen at 2 L/min via nasal cannula when you are resting and and up  to 3 L/min when you are walking around or active (Patient Saturations on2Liters of oxygen while Ambulating = 88 to 92 % -On 3 L of oxygen via nasal cannula patient stayed above 90% with ambulation) Patient needs continuous O2 at 2 L/min continuously via nasal cannula with humidifier, with gaseous portability and conserving device  (patient may use 2 L of oxygen via nasal cannula at rest, may use up to 3 L of oxygen via nasal cannula with activity and ambulation) 8) please follow-up with your primary care physician within a week for repeat CBC and BMP blood test   Increase activity slowly   Complete by: As directed         Discharge Medications     Allergies as of 04/28/2019   No Known Allergies     Medication List    STOP taking these medications   aspirin 81 MG chewable tablet Replaced by: aspirin EC 81 MG tablet   azithromycin 500 MG tablet Commonly known as: ZITHROMAX   cefdinir 300 MG capsule Commonly known as: OMNICEF     TAKE these medications   acetaminophen 325 MG tablet Commonly known as: TYLENOL Take 2 tablets (650 mg total) by mouth every 6 (six) hours as needed for mild pain, fever or headache (or Fever >/= 101).   albuterol 108 (90 Base) MCG/ACT inhaler Commonly known as: VENTOLIN HFA Inhale 2 puffs into the lungs every 4 (four) hours as needed for wheezing or shortness of breath.   aspirin EC  81 MG tablet Take 1 tablet (81 mg total) by mouth daily with breakfast. Replaces: aspirin 81 MG chewable tablet   atorvastatin 20 MG tablet Commonly known as: LIPITOR Take 1 tablet (20 mg total) by mouth daily.   clopidogrel 75 MG tablet  Commonly known as: PLAVIX Take 1 tablet (75 mg total) by mouth daily. For heart What changed: additional instructions   colchicine 0.6 MG tablet Take 0.6 mg by mouth daily.   diazepam 2 MG tablet Commonly known as: VALIUM Take 2 mg by mouth every 12 (twelve) hours as needed for anxiety.   ferrous sulfate 325 (65 FE) MG tablet Take 1 tablet (325 mg total) by mouth 3 (three) times daily.   furosemide 20 MG tablet Commonly known as: Lasix Take 1 tablet (20 mg total) by mouth 2 (two) times daily. What changed: when to take this   guaiFENesin 600 MG 12 hr tablet Commonly known as: MUCINEX Take 1 tablet (600 mg total) by mouth 2 (two) times daily.   metolazone 2.5 MG tablet Commonly known as: ZAROXOLYN Take 1 tablet (2.5 mg total) by mouth See admin instructions. Take only on Monday and Friday mornings for fluid and heart   metoprolol tartrate 25 MG tablet Commonly known as: LOPRESSOR Take 0.5 tablets (12.5 mg total) by mouth 2 (two) times daily. For heart   nitroGLYCERIN 0.4 MG SL tablet Commonly known as: NITROSTAT Place 1 tablet (0.4 mg total) under the tongue every 5 (five) minutes as needed for chest pain. Take   polyethylene glycol 17 g packet Commonly known as: MIRALAX / GLYCOLAX Take 17 g by mouth daily. For bowel   potassium chloride SA 20 MEQ tablet Commonly known as: Klor-Con M20 Take 1 tablet (20 mEq total) by mouth daily.   predniSONE 50 MG tablet Commonly known as: DELTASONE Take 1 tablet (50 mg total) by mouth daily with breakfast for 5 days. For breathing/lungs What changed: additional instructions            Durable Medical Equipment  (From admission, onward)         Start     Ordered   04/28/19 1112  DME  Oxygen  Once    Comments: SATURATION QUALIFICATIONS: (Thisnote is usedto comply with regulatory documentation for home oxygen)  Patient Saturations on Room Air at Rest =84 %  Patient Saturations on Room Air while Ambulating =81 %  Patient Saturations on2Liters of oxygen while Ambulating = 88 to 92 % -On 3 L of oxygen via nasal cannula patient stayed above 90% with ambulation   Patient needs continuous O2 at 2 L/min continuously via nasal cannula with humidifier, with gaseous portability and conserving device  (patient may use 2 L of oxygen via nasal cannula at rest, may use up to 3 L of oxygen via nasal cannula with activity and ambulation)  Question Answer Comment  Length of Need Lifetime   Mode or (Route) Nasal cannula   Liters per Minute 3   Frequency Continuous (stationary and portable oxygen unit needed)   Oxygen conserving device Yes   Oxygen delivery system Gas      04/28/19 1115          Major procedures and Radiology Reports - PLEASE review detailed and final reports for all details, in brief -    DG Chest 2 View  Result Date: 04/28/2019 CLINICAL DATA:  Shortness of breath, diabetes mellitus, hypertension, coronary artery disease post MI and stent placement, smoker, tested COVID negative on 04/23/2019 EXAM: CHEST - 2 VIEW COMPARISON:  Portable exam 0843 hours compared to 04/26/2019 FINDINGS: Borderline enlargement of cardiac silhouette. Mediastinal contours normal. Diffuse BILATERAL pulmonary infiltrates favor multifocal pneumonia less likely edema. Infiltrates appear minimally improved since previous exam. No pleural effusion or pneumothorax. Bowel interposition  between liver and diaphragm. IMPRESSION: Slightly improved pulmonary infiltrates question multifocal infection versus edema. Electronically Signed   By: Lavonia Dana M.D.   On: 04/28/2019 09:33   DG Chest 2 View  Result Date: 04/26/2019 CLINICAL DATA:  Dyspnea EXAM: CHEST - 2 VIEW COMPARISON:  04/23/2019  FINDINGS: No significant change in diffuse bilateral interstitial pulmonary opacity. No new or focal airspace opacity. The heart mediastinum are unremarkable. Gas-filled bowel in the upper abdomen. IMPRESSION: No significant change in diffuse bilateral interstitial pulmonary opacity, consistent with edema or infection. No new or focal airspace opacity. Electronically Signed   By: Eddie Candle M.D.   On: 04/26/2019 10:12   CT Angio Chest PE W and/or Wo Contrast  Result Date: 04/20/2019 CLINICAL DATA:  Shortness of breath. EXAM: CT ANGIOGRAPHY CHEST WITH CONTRAST TECHNIQUE: Multidetector CT imaging of the chest was performed using the standard protocol during bolus administration of intravenous contrast. Multiplanar CT image reconstructions and MIPs were obtained to evaluate the vascular anatomy. CONTRAST:  12mL OMNIPAQUE IOHEXOL 350 MG/ML SOLN COMPARISON:  None. FINDINGS: Cardiovascular: Satisfactory opacification of the pulmonary arteries to the segmental level. No evidence of pulmonary embolism. Normal heart size. No pericardial effusion. Atherosclerosis of thoracic aorta is noted without aneurysm or dissection. Mediastinum/Nodes: Thyroid gland and esophagus are unremarkable. Enlarged pre-vascular a subcarinal adenopathy is noted which most likely is inflammatory in etiology. Lungs/Pleura: No pneumothorax is noted. Minimal bilateral pleural effusions are noted. Multiple airspace opacities are noted throughout both lungs most consistent with multifocal pneumonia, most likely of viral etiology. Upper Abdomen: No acute abnormality. Musculoskeletal: No chest wall abnormality. No acute or significant osseous findings. Review of the MIP images confirms the above findings. IMPRESSION: 1. No definite evidence of pulmonary embolus. 2. Multiple airspace opacities are noted throughout both lungs most consistent with multifocal pneumonia, most likely of viral etiology. 3. Minimal bilateral pleural effusions are noted. 4.  Enlarged mediastinal adenopathy is noted which most likely is inflammatory in etiology. Aortic Atherosclerosis (ICD10-I70.0). Electronically Signed   By: Marijo Conception M.D.   On: 04/20/2019 10:54   US Venous Img Lower Unilateral Right  Result Date: 04/20/2019 CLINICAL DATA:  Swelling x5 days EXAM: RIGHT LOWER EXTREMITY VENOUS DOPPLER ULTRASOUND TECHNIQUE: Gray-scale sonography with compression, as well as color and duplex ultrasound, were performed to evaluate the deep venous system(s) from the level of the common femoral vein through the popliteal and proximal calf veins. COMPARISON:  None. FINDINGS: VENOUS Normal compressibility of the common femoral, superficial femoral, and popliteal veins, as well as the visualized calf veins. Visualized portions of profunda femoral vein and great saphenous vein unremarkable. No filling defects to suggest DVT on grayscale or color Doppler imaging. Doppler waveforms show normal direction of venous flow, normal respiratory phasicity and response to augmentation. Limited views of the contralateral common femoral vein are unremarkable. OTHER Subcutaneous calf edema. Limitations: none IMPRESSION: No femoropopliteal DVT nor evidence of DVT within the visualized calf veins. If clinical symptoms are inconsistent or if there are persistent or worsening symptoms, further imaging (possibly involving the iliac veins) may be warranted. Electronically Signed   By: Lucrezia Europe M.D.   On: 04/20/2019 10:33   DG Chest Port 1 View  Result Date: 04/23/2019 CLINICAL DATA:  Short of breath for 1 week, tobacco abuse EXAM: PORTABLE CHEST 1 VIEW COMPARISON:  04/20/2019 at 8:53 a.m. FINDINGS: Single frontal view of the chest demonstrates a stable cardiac silhouette. Persistent multifocal bilateral ground-glass airspace disease unchanged. No effusion or pneumothorax. No  acute bony abnormality. IMPRESSION: 1. Stable multifocal bilateral ground-glass airspace disease most consistent with atypical  viral pneumonia such as COVID-19. Electronically Signed   By: Randa Ngo M.D.   On: 04/23/2019 21:44   DG Chest Portable 1 View  Result Date: 04/20/2019 CLINICAL DATA:  Shortness of breath, history coronary artery disease, hypertension, type II diabetes mellitus, COPD, smoker EXAM: PORTABLE CHEST 1 VIEW COMPARISON:  Portable exam 0853 hours compared to 01/28/2019 FINDINGS: Lordotic positioning. Enlargement of cardiac silhouette. Mediastinal contours normal. Patchy BILATERAL pulmonary infiltrates consistent with multifocal pneumonia. No pleural effusion or pneumothorax. No acute osseous findings. IMPRESSION: Patchy BILATERAL pulmonary infiltrates consistent with multifocal pneumonia; question of COVID-19 pneumonia is raised. Electronically Signed   By: Lavonia Dana M.D.   On: 04/20/2019 09:00   ECHOCARDIOGRAM COMPLETE  Result Date: 04/20/2019    ECHOCARDIOGRAM REPORT   Patient Name:   Peter Lambert Date of Exam: 04/20/2019 Medical Rec #:  [id number]       Height:       67.0 in Accession #:    [id number]      Weight:       198.4 lb Date of Birth:  [birth year]      BSA:          2.015 m Patient Age:    79 years        BP:           119/65 mmHg Patient Gender: M               HR:           104 bpm. Exam Location:  [facility] Procedure: 2D Echo Indications:    Dyspnea 786.09 / R06.00  History:        Patient has prior history of Echocardiogram examinations, most                 recent 07/03/2016. CAD, COPD; Risk Factors:Diabetes, Dyslipidemia                 and Hypertension. Acute respiratory failure with hypoxia ,                 Peripheral arterial disease.  Sonographer:    Leavy Cella RDCS (AE) Referring Phys: [place]  1. Left ventricular ejection fraction, by estimation, is 50 to 55%. The left ventricle has low normal function. The left ventricle has no regional wall motion abnormalities. Indeterminate diastolic filling due to E-A fusion. Elevated left ventricular end-diastolic  pressure.  2. Right ventricular systolic function is mildly reduced. The right ventricular size is normal. There is moderately elevated pulmonary artery systolic pressure.  3. Left atrial size was moderately dilated.  4. The mitral valve is grossly normal. Mild mitral valve regurgitation.  5. The aortic valve is tricuspid. Aortic valve regurgitation is not visualized. No aortic stenosis is present.  6. The inferior vena cava is dilated in size with >50% respiratory variability, suggesting right atrial pressure of 8 mmHg. FINDINGS  Left Ventricle: Left ventricular ejection fraction, by estimation, is 50 to 55%. The left ventricle has low normal function. The left ventricle has no regional wall motion abnormalities. The left ventricular internal cavity size was normal in size. There is no left ventricular hypertrophy. Indeterminate diastolic filling due to E-A fusion. Elevated left ventricular end-diastolic pressure. Right Ventricle: The right ventricular size is normal. No increase in right ventricular wall thickness. Right ventricular systolic function is mildly reduced. There is moderately elevated pulmonary  artery systolic pressure. The tricuspid regurgitant velocity is 3.30 m/s, and with an assumed right atrial pressure of 10 mmHg, the estimated right ventricular systolic pressure is [id number] mmHg. Left Atrium: Left atrial size was moderately dilated. Right Atrium: Right atrial size was normal in size. Pericardium: There is no evidence of pericardial effusion. Mitral Valve: The mitral valve is grossly normal. Mild mitral valve regurgitation. Tricuspid Valve: The tricuspid valve is grossly normal. Tricuspid valve regurgitation is mild. Aortic Valve: The aortic valve is tricuspid. . There is mild thickening of the aortic valve. Aortic valve regurgitation is not visualized. No aortic stenosis is present. There is mild thickening of the aortic valve. Pulmonic Valve: The pulmonic valve was grossly normal. Pulmonic valve  regurgitation is not visualized. Aorta: The aortic root is normal in size and structure. Venous: The inferior vena cava is dilated in size with greater than 50% respiratory variability, suggesting right atrial pressure of 8 mmHg. IAS/Shunts: No atrial level shunt detected by color flow Doppler.  LEFT VENTRICLE PLAX 2D LVIDd:         5.04 cm  Diastology LVIDs:         4.45 cm  LV e' lateral:   8.16 cm/s LV PW:         1.28 cm  LV E/e' lateral: 10.7 LV IVS:        0.86 cm  LV e' medial:    4.03 cm/s LVOT diam:     2.10 cm  LV E/e' medial:  21.7 LVOT Area:     3.46 cm  RIGHT VENTRICLE RV S prime:     6.31 cm/s TAPSE (M-mode): 1.1 cm LEFT ATRIUM             Index       RIGHT ATRIUM           Index LA diam:        5.20 cm 2.58 cm/m  RA Area:     22.20 cm LA Vol (A2C):   68.3 ml 33.89 ml/m RA Volume:   78.40 ml  38.90 ml/m LA Vol (A4C):   80.0 ml 39.69 ml/m LA Biplane Vol: 80.7 ml 40.04 ml/m   AORTA Ao Root diam: 3.20 cm MITRAL VALVE               TRICUSPID VALVE MV Area (PHT): 4.60 cm    TR Peak grad:   43.6 mmHg MV Decel Time: 165 msec    TR Vmax:        330.00 cm/s MR Peak grad: 60.2 mmHg MR Mean grad: 42.0 mmHg    SHUNTS MR Vmax:      388.00 cm/s  Systemic Diam: 2.10 cm MR Vmean:     307.0 cm/s MV E velocity: 87.30 cm/s MV A velocity: 39.80 cm/s MV E/A ratio:  2.19 Kate Sable MD Electronically signed by Kate Sable MD Signature Date/Time: 04/20/2019/3:05:50 PM    Final    Micro Results   Recent Results (from the past 240 hour(s))  Respiratory Panel by RT PCR (Flu A&B, Covid) - Nasopharyngeal Swab     Status: None   Collection Time: 04/20/19  9:28 AM   Specimen: Nasopharyngeal Swab  Result Value Ref Range Status   SARS Coronavirus 2 by RT PCR NEGATIVE NEGATIVE Final    Comment: (NOTE) SARS-CoV-2 target nucleic acids are NOT DETECTED. The SARS-CoV-2 RNA is generally detectable in upper respiratoy specimens during the acute phase of infection. The lowest concentration of SARS-CoV-2 viral  copies this  assay can detect is 131 copies/mL. A negative result does not preclude SARS-Cov-2 infection and should not be used as the sole basis for treatment or other patient management decisions. A negative result may occur with  improper specimen collection/handling, submission of specimen other than nasopharyngeal swab, presence of viral mutation(s) within the areas targeted by this assay, and inadequate number of viral copies (<131 copies/mL). A negative result must be combined with clinical observations, patient history, and epidemiological information. The expected result is Negative. Fact Sheet for Patients:  PinkCheek.be Fact Sheet for Healthcare Providers:  GravelBags.it This test is not yet ap proved or cleared by the [place] FDA and  has been authorized for detection and/or diagnosis of SARS-CoV-2 by FDA under an Emergency Use Authorization (EUA). This EUA will remain  in effect (meaning this test can be used) for the duration of the COVID-19 declaration under Section 564(b)(1) of the Act, 21 U.S.C. section 360bbb-3(b)(1), unless the authorization is terminated or revoked sooner.    Influenza A by PCR NEGATIVE NEGATIVE Final   Influenza B by PCR NEGATIVE NEGATIVE Final    Comment: (NOTE) The Xpert Xpress SARS-CoV-2/FLU/RSV assay is intended as an aid in  the diagnosis of influenza from Nasopharyngeal swab specimens and  should not be used as a sole basis for treatment. Nasal washings and  aspirates are unacceptable for Xpert Xpress SARS-CoV-2/FLU/RSV  testing. Fact Sheet for Patients: PinkCheek.be Fact Sheet for Healthcare Providers: GravelBags.it This test is not yet approved or cleared by the [place] FDA and  has been authorized for detection and/or diagnosis of SARS-CoV-2 by  FDA under an Emergency Use Authorization (EUA). This EUA will  remain  in effect (meaning this test can be used) for the duration of the  Covid-19 declaration under Section 564(b)(1) of the Act, 21  U.S.C. section 360bbb-3(b)(1), unless the authorization is  terminated or revoked. Performed at [facility], [address], [place], [place] [address]   Blood culture (routine x 2)     Status: None   Collection Time: 04/20/19 10:57 AM   Specimen: BLOOD LEFT ARM  Result Value Ref Range Status   Specimen Description BLOOD LEFT ARM  Final   Special Requests   Final    BOTTLES DRAWN AEROBIC AND ANAEROBIC Blood Culture results may not be optimal due to an inadequate volume of blood received in culture bottles   Culture   Final    NO GROWTH 5 DAYS Performed at [facility], [address] [place], [place] [address]    Report Status 04/25/2019 FINAL  Final  Blood culture (routine x 2)     Status: None   Collection Time: 04/20/19 11:02 AM   Specimen: BLOOD LEFT WRIST  Result Value Ref Range Status   Specimen Description BLOOD LEFT WRIST  Final   Special Requests   Final    BOTTLES DRAWN AEROBIC AND ANAEROBIC Blood Culture results may not be optimal due to an inadequate volume of blood received in culture bottles   Culture   Final    NO GROWTH 5 DAYS Performed at [facility], [address] [place], [place] [address]    Report Status 04/25/2019 FINAL  Final  MRSA PCR Screening     Status: None   Collection Time: 04/20/19  3:43 PM   Specimen: Nasal Mucosa; Nasopharyngeal  Result Value Ref Range Status   MRSA by PCR NEGATIVE NEGATIVE Final    Comment:        The GeneXpert MRSA Assay (  FDA approved for NASAL specimens only), is one component of a comprehensive MRSA colonization surveillance program. It is not intended to diagnose MRSA infection nor to guide or monitor treatment for MRSA infections. Performed at [facility], [address], [place], [place] [address]   SARS CORONAVIRUS 2 (TAT 6-24 HRS) Nasopharyngeal Nasopharyngeal Swab      Status: None   Collection Time: 04/23/19 11:12 PM   Specimen: Nasopharyngeal Swab  Result Value Ref Range Status   SARS Coronavirus 2 NEGATIVE NEGATIVE Final    Comment: (NOTE) SARS-CoV-2 target nucleic acids are NOT DETECTED. The SARS-CoV-2 RNA is generally detectable in upper and lower respiratory specimens during the acute phase of infection. Negative results do not preclude SARS-CoV-2 infection, do not rule out co-infections with other pathogens, and should not be used as the sole basis for treatment or other patient management decisions. Negative results must be combined with clinical observations, patient history, and epidemiological information. The expected result is Negative. Fact Sheet for Patients: SugarRoll.be Fact Sheet for Healthcare Providers: https://www.woods-mathews.com/ This test is not yet approved or cleared by the [place] FDA and  has been authorized for detection and/or diagnosis of SARS-CoV-2 by FDA under an Emergency Use Authorization (EUA). This EUA will remain  in effect (meaning this test can be used) for the duration of the COVID-19 declaration under Section 56 4(b)(1) of the Act, 21 U.S.C. section 360bbb-3(b)(1), unless the authorization is terminated or revoked sooner. Performed at [facility], [place] [address] [place], [place] [address]        Today   Subjective    Peter Lambert today has no new complaints -Cough mostly resolved No fever  Or chills  No Nausea, Vomiting or Diarrhea - Patient Saturations on2Liters of oxygen while Ambulating = 88 to 92 % -On 3 L of oxygen via nasal cannula patient stayed above 90% with ambulation) --With ambulation no chest pains, no palpitations no dizziness         Patient has been seen and examined prior to discharge   Objective   Blood pressure (!) 110/52, pulse 96, temperature 98.4 F (36.9 C), temperature source Oral, resp. rate 20, height 5'  8" (1.727 m), weight 73.4 kg, SpO2 91 %.   Intake/Output Summary (Last 24 hours) at 04/28/2019 1127 Last data filed at 04/28/2019 0846 Gross per 24 hour  Intake 480 ml  Output 3250 ml  Net -2770 ml    Exam Gen:- Awake Alert, no acute distress, speaking in complete sentences HEENT:- [place].AT, No sclera icterus Nose- [place] 2L/min Neck-Supple Neck,No JVD,.  Lungs-much improved air movement, no wheezing  CV- S1, S2 normal, irregular Abd-  +ve B.Sounds, Abd Soft, No tenderness,    Extremity/Skin:-Much improved lower extremity edema,   good pulses Psych-affect is appropriate, oriented x3 Neuro-generalized weakness, no new focal deficits, no tremors    Data Review   CBC w Diff:  Lab Results  Component Value Date   WBC 15.5 (H) 04/26/2019   HGB 10.3 (L) 04/26/2019   HCT 32.9 (L) 04/26/2019   PLT 397 04/26/2019   LYMPHOPCT 3 04/23/2019   MONOPCT 5 04/23/2019   EOSPCT 0 04/23/2019   BASOPCT 0 04/23/2019    CMP:  Lab Results  Component Value Date   NA 133 (L) 04/28/2019   K 4.7 04/28/2019   CL 97 (L) 04/28/2019   CO2 26 04/28/2019   BUN 59 (H) 04/28/2019   CREATININE 1.49 (H) 04/28/2019   PROT 7.7 04/23/2019   ALBUMIN  2.9 (L) 04/23/2019   BILITOT 0.6 04/23/2019   ALKPHOS 85 04/23/2019   AST 28 04/23/2019   ALT 46 (H) 04/23/2019  .   Total Discharge time is about 33 minutes  Roxan Hockey M.D on 04/28/2019 at 11:27 AM  Go to [email or web address] -  for contact info  [facility] - Office  [phone]

## 2019-04-29 DIAGNOSIS — I110 Hypertensive heart disease with heart failure: Secondary | ICD-10-CM | POA: Diagnosis not present

## 2019-04-29 DIAGNOSIS — J9621 Acute and chronic respiratory failure with hypoxia: Secondary | ICD-10-CM | POA: Diagnosis not present

## 2019-04-29 DIAGNOSIS — J440 Chronic obstructive pulmonary disease with acute lower respiratory infection: Secondary | ICD-10-CM | POA: Diagnosis not present

## 2019-04-29 DIAGNOSIS — E785 Hyperlipidemia, unspecified: Secondary | ICD-10-CM | POA: Diagnosis not present

## 2019-04-29 DIAGNOSIS — K219 Gastro-esophageal reflux disease without esophagitis: Secondary | ICD-10-CM | POA: Diagnosis not present

## 2019-04-29 DIAGNOSIS — I5033 Acute on chronic diastolic (congestive) heart failure: Secondary | ICD-10-CM | POA: Diagnosis not present

## 2019-04-29 DIAGNOSIS — Z720 Tobacco use: Secondary | ICD-10-CM | POA: Diagnosis not present

## 2019-04-29 DIAGNOSIS — E1151 Type 2 diabetes mellitus with diabetic peripheral angiopathy without gangrene: Secondary | ICD-10-CM | POA: Diagnosis not present

## 2019-04-29 DIAGNOSIS — J441 Chronic obstructive pulmonary disease with (acute) exacerbation: Secondary | ICD-10-CM | POA: Diagnosis not present

## 2019-04-29 DIAGNOSIS — I2510 Atherosclerotic heart disease of native coronary artery without angina pectoris: Secondary | ICD-10-CM | POA: Diagnosis not present

## 2019-04-29 DIAGNOSIS — I252 Old myocardial infarction: Secondary | ICD-10-CM | POA: Diagnosis not present

## 2019-04-29 DIAGNOSIS — J188 Other pneumonia, unspecified organism: Secondary | ICD-10-CM | POA: Diagnosis not present

## 2019-04-29 DIAGNOSIS — I4892 Unspecified atrial flutter: Secondary | ICD-10-CM | POA: Diagnosis not present

## 2019-04-30 DIAGNOSIS — Z6824 Body mass index (BMI) 24.0-24.9, adult: Secondary | ICD-10-CM | POA: Diagnosis not present

## 2019-04-30 DIAGNOSIS — J189 Pneumonia, unspecified organism: Secondary | ICD-10-CM | POA: Diagnosis not present

## 2019-04-30 DIAGNOSIS — J449 Chronic obstructive pulmonary disease, unspecified: Secondary | ICD-10-CM | POA: Diagnosis not present

## 2019-04-30 DIAGNOSIS — I509 Heart failure, unspecified: Secondary | ICD-10-CM | POA: Diagnosis not present

## 2019-05-06 ENCOUNTER — Ambulatory Visit: Payer: PPO | Admitting: Cardiovascular Disease

## 2019-05-07 DIAGNOSIS — Z6824 Body mass index (BMI) 24.0-24.9, adult: Secondary | ICD-10-CM | POA: Diagnosis not present

## 2019-05-07 DIAGNOSIS — Z0001 Encounter for general adult medical examination with abnormal findings: Secondary | ICD-10-CM | POA: Diagnosis not present

## 2019-05-07 DIAGNOSIS — K219 Gastro-esophageal reflux disease without esophagitis: Secondary | ICD-10-CM | POA: Diagnosis not present

## 2019-05-07 DIAGNOSIS — Z1389 Encounter for screening for other disorder: Secondary | ICD-10-CM | POA: Diagnosis not present

## 2019-05-07 DIAGNOSIS — I10 Essential (primary) hypertension: Secondary | ICD-10-CM | POA: Diagnosis not present

## 2019-05-07 DIAGNOSIS — E7849 Other hyperlipidemia: Secondary | ICD-10-CM | POA: Diagnosis not present

## 2019-05-07 DIAGNOSIS — J189 Pneumonia, unspecified organism: Secondary | ICD-10-CM | POA: Diagnosis not present

## 2019-05-07 DIAGNOSIS — J449 Chronic obstructive pulmonary disease, unspecified: Secondary | ICD-10-CM | POA: Diagnosis not present

## 2019-05-11 ENCOUNTER — Telehealth: Payer: Self-pay | Admitting: Cardiovascular Disease

## 2019-05-11 NOTE — Telephone Encounter (Signed)
Peter Lambert, son of patient wanted to know if Dr. Gwenlyn Found would do what is called a "walk test" so that the patient can get an air concentrator instead of having to carry around oxygen bottles. He has had a hard time refilling the bottles and using a concentrator would be a better alternative.  Please let the son know if this is something Dr. Gwenlyn Found can do at his upcoming appointment.  The son also would like Dr. Gwenlyn Found to sign the forms so that the patient can get a short term handicapped placard. The son will bring all of the paperwork with him to the appointment.

## 2019-05-11 NOTE — Telephone Encounter (Signed)
[name] son has been made aware that oxygen is typically done through the PCP. He will call them to get the process started. He has been advised to call back if anything further is needed.

## 2019-05-16 DIAGNOSIS — Z23 Encounter for immunization: Secondary | ICD-10-CM | POA: Diagnosis not present

## 2019-05-26 DIAGNOSIS — D72829 Elevated white blood cell count, unspecified: Secondary | ICD-10-CM | POA: Diagnosis not present

## 2019-05-26 DIAGNOSIS — E871 Hypo-osmolality and hyponatremia: Secondary | ICD-10-CM | POA: Diagnosis not present

## 2019-05-26 DIAGNOSIS — J449 Chronic obstructive pulmonary disease, unspecified: Secondary | ICD-10-CM | POA: Diagnosis not present

## 2019-05-26 DIAGNOSIS — Z6827 Body mass index (BMI) 27.0-27.9, adult: Secondary | ICD-10-CM | POA: Diagnosis not present

## 2019-05-26 DIAGNOSIS — E1140 Type 2 diabetes mellitus with diabetic neuropathy, unspecified: Secondary | ICD-10-CM | POA: Diagnosis not present

## 2019-05-28 ENCOUNTER — Other Ambulatory Visit: Payer: Self-pay | Admitting: Cardiovascular Disease

## 2019-05-28 DIAGNOSIS — J441 Chronic obstructive pulmonary disease with (acute) exacerbation: Secondary | ICD-10-CM | POA: Diagnosis not present

## 2019-05-28 DIAGNOSIS — I252 Old myocardial infarction: Secondary | ICD-10-CM | POA: Diagnosis not present

## 2019-05-28 DIAGNOSIS — I110 Hypertensive heart disease with heart failure: Secondary | ICD-10-CM | POA: Diagnosis not present

## 2019-05-28 DIAGNOSIS — J9621 Acute and chronic respiratory failure with hypoxia: Secondary | ICD-10-CM | POA: Diagnosis not present

## 2019-05-28 DIAGNOSIS — I2510 Atherosclerotic heart disease of native coronary artery without angina pectoris: Secondary | ICD-10-CM | POA: Diagnosis not present

## 2019-05-28 DIAGNOSIS — K219 Gastro-esophageal reflux disease without esophagitis: Secondary | ICD-10-CM | POA: Diagnosis not present

## 2019-05-28 DIAGNOSIS — J188 Other pneumonia, unspecified organism: Secondary | ICD-10-CM | POA: Diagnosis not present

## 2019-05-28 DIAGNOSIS — I4892 Unspecified atrial flutter: Secondary | ICD-10-CM | POA: Diagnosis not present

## 2019-05-28 DIAGNOSIS — E785 Hyperlipidemia, unspecified: Secondary | ICD-10-CM | POA: Diagnosis not present

## 2019-05-28 DIAGNOSIS — E1151 Type 2 diabetes mellitus with diabetic peripheral angiopathy without gangrene: Secondary | ICD-10-CM | POA: Diagnosis not present

## 2019-05-28 DIAGNOSIS — Z720 Tobacco use: Secondary | ICD-10-CM | POA: Diagnosis not present

## 2019-05-28 DIAGNOSIS — J440 Chronic obstructive pulmonary disease with acute lower respiratory infection: Secondary | ICD-10-CM | POA: Diagnosis not present

## 2019-05-28 DIAGNOSIS — I5033 Acute on chronic diastolic (congestive) heart failure: Secondary | ICD-10-CM | POA: Diagnosis not present

## 2019-05-29 DIAGNOSIS — J449 Chronic obstructive pulmonary disease, unspecified: Secondary | ICD-10-CM | POA: Diagnosis not present

## 2019-06-01 DIAGNOSIS — J449 Chronic obstructive pulmonary disease, unspecified: Secondary | ICD-10-CM | POA: Diagnosis not present

## 2019-06-03 ENCOUNTER — Encounter: Payer: Self-pay | Admitting: Cardiovascular Disease

## 2019-06-03 ENCOUNTER — Other Ambulatory Visit: Payer: Self-pay

## 2019-06-03 ENCOUNTER — Ambulatory Visit (INDEPENDENT_AMBULATORY_CARE_PROVIDER_SITE_OTHER): Payer: PPO | Admitting: Cardiovascular Disease

## 2019-06-03 DIAGNOSIS — I10 Essential (primary) hypertension: Secondary | ICD-10-CM

## 2019-06-03 DIAGNOSIS — I739 Peripheral vascular disease, unspecified: Secondary | ICD-10-CM

## 2019-06-03 DIAGNOSIS — E782 Mixed hyperlipidemia: Secondary | ICD-10-CM | POA: Diagnosis not present

## 2019-06-03 DIAGNOSIS — I2510 Atherosclerotic heart disease of native coronary artery without angina pectoris: Secondary | ICD-10-CM

## 2019-06-03 MED ORDER — ATORVASTATIN CALCIUM 40 MG PO TABS: 40.0000 mg | ORAL_TABLET | Freq: Every day | ORAL | 3 refills | Status: DC

## 2019-06-03 NOTE — Assessment & Plan Note (Signed)
Discontinue tobacco abuse 6 months ago after a lifetime of smoking

## 2019-06-03 NOTE — Assessment & Plan Note (Signed)
History of hyperlipidemia on atorvastatin 20 mg a day with lipid liver profile performed 05/07/2019 revealing total cholesterol 188, LDL of 99 HDL of 61, not at goal for secondary prevention.  I am going to increase his atorvastatin from 20 to 40 mg a day and we will recheck lipid liver profile in 2 months.

## 2019-06-03 NOTE — Patient Instructions (Addendum)
Your physician has recommended you make the following change in your medication: INCREASE ATORVASTATIN TO 40 MG EVERY EVENING  Your physician recommends that you return for lab work in: [place] 2 MONTHS  DUE June  Your physician wants you to follow-up in: [place] will receive a reminder letter in the mail two months in advance. If you don't receive a letter, please call our office to schedule the follow-up appointment.

## 2019-06-03 NOTE — Assessment & Plan Note (Signed)
History of essential hypertension blood pressure measured today 124/76.  He is on metoprolol.

## 2019-06-03 NOTE — Progress Notes (Signed)
06/03/2019 Peter Lambert   [birth year]  [id number]  Primary Physician Redmond School, MD Primary Cardiologist: Lorretta Harp MD Lupe Car[place], [place]  HPI:  Peter Lambert is a 74 y.o.  mildly overweight divorced African American male, father of 2, grandfather to 4 grandchildren, who I last spoke to on the phone for a virtual telemedicine phone visit on 10/01/2018.Marland Kitchen He had a non-ST-segment-elevation myocardial infarction in April 2010. I catheterized him revealing high-grade proximal LAD stenosis, which I stented using a 2.75 x 18 mm long Xience V drug-eluting stent. I did jail the first diagonal branch, performed angioplasty at the ostium, reducing an 80% stenosis to less than 40%. He had a dominant circumflex system with an 80% distal AV groove circumflex as well as a nondominant RCA. He had normal LV function. Abdominal aortogram revealed a patent aortobifemoral bypass graft, which was placed approximately16years ago. He has known occluded SFAs bilaterally with ABIs that run in the 0.5 or 0.6 range. He denies chest pain, shortness of breath, or claudication. He continues to smoke 3 packs of cigarettes per week despite counseling to the contrary. He did stop smoking for a while and then restarted back and a third pack a day.  Since I spoke to him 8 months ago he was admitted to the hospital on 04/23/2019 with acute respiratory failure.  He did leave AMA but came back and was discharged on 04/28/2019.  He had community-acquired pneumonia, COPD exacerbation and diastolic dysfunction.  He was diuresed from an initial weight of 175 down to 161 at discharge.  He has gained a moderate amount of weight since that time.  We talked about the importance of salt restriction and dietary modification.  He still is on 3 L of nasal oxygen which will be adjusted by his primary care provider.   Current Meds  Medication Sig  . acetaminophen (TYLENOL) 325 MG tablet Take 2 tablets (650 mg total) by mouth  every 6 (six) hours as needed for mild pain, fever or headache (or Fever >/= 101).  Marland Kitchen albuterol (VENTOLIN HFA) 108 (90 Base) MCG/ACT inhaler Inhale 2 puffs into the lungs every 4 (four) hours as needed for wheezing or shortness of breath.  Marland Kitchen aspirin EC 81 MG tablet Take 1 tablet (81 mg total) by mouth daily with breakfast.  . clopidogrel (PLAVIX) 75 MG tablet Take 1 tablet (75 mg total) by mouth daily. For heart  . colchicine 0.6 MG tablet Take 0.6 mg by mouth daily.   . diazepam (VALIUM) 2 MG tablet Take 2 mg by mouth every 12 (twelve) hours as needed for anxiety.  Marland Kitchen doxazosin (CARDURA) 2 MG tablet Take 2 mg by mouth daily.  . ferrous sulfate 325 (65 FE) MG tablet Take 1 tablet (325 mg total) by mouth 3 (three) times daily.  . furosemide (LASIX) 20 MG tablet Take 1 tablet (20 mg total) by mouth 2 (two) times daily.  Marland Kitchen guaiFENesin (MUCINEX) 600 MG 12 hr tablet Take 1 tablet (600 mg total) by mouth 2 (two) times daily.  . metolazone (ZAROXOLYN) 2.5 MG tablet Take 1 tablet (2.5 mg total) by mouth See admin instructions. Take only on Monday and Friday mornings for fluid and heart  . metoprolol tartrate (LOPRESSOR) 25 MG tablet Take 0.5 tablets (12.5 mg total) by mouth 2 (two) times daily. For heart  . nitroGLYCERIN (NITROSTAT) 0.4 MG SL tablet PLACE 1 TABLET UNDER THE TONGUE EVERY 5 MINUTES AS NEEDED FOR CHEST PAIN  .  polyethylene glycol (MIRALAX / GLYCOLAX) 17 g packet Take 17 g by mouth daily. For bowel  . potassium chloride SA (KLOR-CON M20) 20 MEQ tablet Take 1 tablet (20 mEq total) by mouth daily.  . [DISCONTINUED] atorvastatin (LIPITOR) 20 MG tablet Take 1 tablet (20 mg total) by mouth daily.     No Known Allergies  Social History   Socioeconomic History  . Marital status: Divorced    Spouse name: Not on file  . Number of children: Not on file  . Years of education: Not on file  . Highest education level: Not on file  Occupational History  . Not on file  Tobacco Use  . Smoking  status: Former Smoker    Packs/day: 0.50    Years: 52.00    Pack years: 26.00    Types: Cigarettes  . Smokeless tobacco: Never Used  Substance and Sexual Activity  . Alcohol use: No  . Drug use: No  . Sexual activity: Never  Other Topics Concern  . Not on file  Social History Narrative  . Not on file   Social Determinants of Health   Financial Resource Strain:   . Difficulty of Paying Living Expenses:   Food Insecurity:   . Worried About Charity fundraiser in the Last Year:   . Arboriculturist in the Last Year:   Transportation Needs:   . Film/video editor (Medical):   Marland Kitchen Lack of Transportation (Non-Medical):   Physical Activity:   . Days of Exercise per Week:   . Minutes of Exercise per Session:   Stress:   . Feeling of Stress :   Social Connections:   . Frequency of Communication with Friends and Family:   . Frequency of Social Gatherings with Friends and Family:   . Attends Religious Services:   . Active Member of Clubs or Organizations:   . Attends Archivist Meetings:   Marland Kitchen Marital Status:   Intimate Partner Violence:   . Fear of Current or Ex-Partner:   . Emotionally Abused:   Marland Kitchen Physically Abused:   . Sexually Abused:      Review of Systems: General: negative for chills, fever, night sweats or weight changes.  Cardiovascular: negative for chest pain, dyspnea on exertion, edema, orthopnea, palpitations, paroxysmal nocturnal dyspnea or shortness of breath Dermatological: negative for rash Respiratory: negative for cough or wheezing Urologic: negative for hematuria Abdominal: negative for nausea, vomiting, diarrhea, bright red blood per rectum, melena, or hematemesis Neurologic: negative for visual changes, syncope, or dizziness All other systems reviewed and are otherwise negative except as noted above.    Blood pressure 124/76, pulse (!) 42, height 5\' 8"  (1.727 m), weight 177 lb 3.2 oz (80.4 kg), SpO2 99 %.  General appearance: alert and no  distress Neck: no adenopathy, no carotid bruit, no JVD, supple, symmetrical, trachea midline and thyroid not enlarged, symmetric, no tenderness/mass/nodules Lungs: clear to auscultation bilaterally Heart: regular rate and rhythm, S1, S2 normal, no murmur, click, rub or gallop Extremities: extremities normal, atraumatic, no cyanosis or edema Pulses: Absent pedal pulses Skin: Skin color, texture, turgor normal. No rashes or lesions Neurologic: Alert and oriented X 3, normal strength and tone. Normal symmetric reflexes. Normal coordination and gait  EKG not performed today  ASSESSMENT AND PLAN:   Peripheral arterial disease (HCC) History of PAD status post remote aortobifemoral bypass grafting with known occluded SFAs bilaterally and ABIs that run in the 8.5 range.  He denies claudication.  Coronary artery  disease History of CAD status post proximal LAD stenting by myself April 2010 using a 2.75 mm x 18 mm long Xience 5 drug-eluting stent.  I did jail the first diagonal branch perform angioplasty of the ostium reducing 80% stenosis to less than 40%.  He had a dominant circumflex with an 80% distal AV groove stenosis in a nondominant RCA with normal LV function.  He denies chest pain.  Essential hypertension History of essential hypertension blood pressure measured today 124/76.  He is on metoprolol.  Hyperlipidemia History of hyperlipidemia on atorvastatin 20 mg a day with lipid liver profile performed 05/07/2019 revealing total cholesterol 188, LDL of 99 HDL of 61, not at goal for secondary prevention.  I am going to increase his atorvastatin from 20 to 40 mg a day and we will recheck lipid liver profile in 2 months.  Tobacco use Discontinue tobacco abuse 6 months ago after a lifetime of smoking      Lorretta Harp MD [facility], [facility] 06/03/2019 10:03 AM

## 2019-06-03 NOTE — Assessment & Plan Note (Signed)
History of CAD status post proximal LAD stenting by myself April 2010 using a 2.75 mm x 18 mm long Xience 5 drug-eluting stent.  I did jail the first diagonal branch perform angioplasty of the ostium reducing 80% stenosis to less than 40%.  He had a dominant circumflex with an 80% distal AV groove stenosis in a nondominant RCA with normal LV function.  He denies chest pain.

## 2019-06-03 NOTE — Assessment & Plan Note (Signed)
History of PAD status post remote aortobifemoral bypass grafting with known occluded SFAs bilaterally and ABIs that run in the 8.5 range.  He denies claudication.

## 2019-06-12 DIAGNOSIS — J449 Chronic obstructive pulmonary disease, unspecified: Secondary | ICD-10-CM | POA: Diagnosis not present

## 2019-06-12 DIAGNOSIS — J9610 Chronic respiratory failure, unspecified whether with hypoxia or hypercapnia: Secondary | ICD-10-CM | POA: Diagnosis not present

## 2019-06-12 DIAGNOSIS — R0902 Hypoxemia: Secondary | ICD-10-CM | POA: Diagnosis not present

## 2019-06-16 DIAGNOSIS — I10 Essential (primary) hypertension: Secondary | ICD-10-CM | POA: Diagnosis not present

## 2019-06-16 DIAGNOSIS — K219 Gastro-esophageal reflux disease without esophagitis: Secondary | ICD-10-CM | POA: Diagnosis not present

## 2019-06-16 DIAGNOSIS — Z6826 Body mass index (BMI) 26.0-26.9, adult: Secondary | ICD-10-CM | POA: Diagnosis not present

## 2019-06-16 DIAGNOSIS — J449 Chronic obstructive pulmonary disease, unspecified: Secondary | ICD-10-CM | POA: Diagnosis not present

## 2019-06-16 DIAGNOSIS — E119 Type 2 diabetes mellitus without complications: Secondary | ICD-10-CM | POA: Diagnosis not present

## 2019-06-16 DIAGNOSIS — M1991 Primary osteoarthritis, unspecified site: Secondary | ICD-10-CM | POA: Diagnosis not present

## 2019-06-26 DIAGNOSIS — N184 Chronic kidney disease, stage 4 (severe): Secondary | ICD-10-CM | POA: Diagnosis not present

## 2019-06-26 DIAGNOSIS — I129 Hypertensive chronic kidney disease with stage 1 through stage 4 chronic kidney disease, or unspecified chronic kidney disease: Secondary | ICD-10-CM | POA: Diagnosis not present

## 2019-06-26 DIAGNOSIS — E7849 Other hyperlipidemia: Secondary | ICD-10-CM | POA: Diagnosis not present

## 2019-06-26 DIAGNOSIS — E1122 Type 2 diabetes mellitus with diabetic chronic kidney disease: Secondary | ICD-10-CM | POA: Diagnosis not present

## 2019-06-28 DIAGNOSIS — J449 Chronic obstructive pulmonary disease, unspecified: Secondary | ICD-10-CM | POA: Diagnosis not present

## 2019-07-01 DIAGNOSIS — J449 Chronic obstructive pulmonary disease, unspecified: Secondary | ICD-10-CM | POA: Diagnosis not present

## 2019-07-20 DIAGNOSIS — J440 Chronic obstructive pulmonary disease with acute lower respiratory infection: Secondary | ICD-10-CM | POA: Diagnosis not present

## 2019-07-20 DIAGNOSIS — I2510 Atherosclerotic heart disease of native coronary artery without angina pectoris: Secondary | ICD-10-CM | POA: Diagnosis not present

## 2019-07-20 DIAGNOSIS — J9621 Acute and chronic respiratory failure with hypoxia: Secondary | ICD-10-CM | POA: Diagnosis not present

## 2019-07-20 DIAGNOSIS — J188 Other pneumonia, unspecified organism: Secondary | ICD-10-CM | POA: Diagnosis not present

## 2019-07-29 DIAGNOSIS — Z6831 Body mass index (BMI) 31.0-31.9, adult: Secondary | ICD-10-CM | POA: Diagnosis not present

## 2019-07-29 DIAGNOSIS — J449 Chronic obstructive pulmonary disease, unspecified: Secondary | ICD-10-CM | POA: Diagnosis not present

## 2019-07-29 DIAGNOSIS — E6609 Other obesity due to excess calories: Secondary | ICD-10-CM | POA: Diagnosis not present

## 2019-07-29 DIAGNOSIS — I509 Heart failure, unspecified: Secondary | ICD-10-CM | POA: Diagnosis not present

## 2019-07-29 DIAGNOSIS — R609 Edema, unspecified: Secondary | ICD-10-CM | POA: Diagnosis not present

## 2019-08-01 DIAGNOSIS — J449 Chronic obstructive pulmonary disease, unspecified: Secondary | ICD-10-CM | POA: Diagnosis not present

## 2019-08-04 DIAGNOSIS — E663 Overweight: Secondary | ICD-10-CM | POA: Diagnosis not present

## 2019-08-04 DIAGNOSIS — I509 Heart failure, unspecified: Secondary | ICD-10-CM | POA: Diagnosis not present

## 2019-08-04 DIAGNOSIS — Z6829 Body mass index (BMI) 29.0-29.9, adult: Secondary | ICD-10-CM | POA: Diagnosis not present

## 2019-08-29 ENCOUNTER — Emergency Department (HOSPITAL_COMMUNITY): Payer: PPO

## 2019-08-29 ENCOUNTER — Inpatient Hospital Stay (HOSPITAL_COMMUNITY)
Admission: EM | Admit: 2019-08-29 | Discharge: 2019-09-05 | DRG: 853 | Disposition: A | Discharge status: Home Health | Payer: PPO | Attending: Internal Medicine | Admitting: Internal Medicine

## 2019-08-29 ENCOUNTER — Encounter (HOSPITAL_COMMUNITY): Payer: Self-pay

## 2019-08-29 DIAGNOSIS — Z91199 Patient's noncompliance with other medical treatment and regimen due to unspecified reason: Secondary | ICD-10-CM

## 2019-08-29 DIAGNOSIS — I469 Cardiac arrest, cause unspecified: Secondary | ICD-10-CM | POA: Diagnosis present

## 2019-08-29 DIAGNOSIS — E785 Hyperlipidemia, unspecified: Secondary | ICD-10-CM | POA: Diagnosis present

## 2019-08-29 DIAGNOSIS — I130 Hypertensive heart and chronic kidney disease with heart failure and stage 1 through stage 4 chronic kidney disease, or unspecified chronic kidney disease: Secondary | ICD-10-CM | POA: Diagnosis not present

## 2019-08-29 DIAGNOSIS — E876 Hypokalemia: Secondary | ICD-10-CM | POA: Diagnosis present

## 2019-08-29 DIAGNOSIS — I248 Other forms of acute ischemic heart disease: Secondary | ICD-10-CM | POA: Diagnosis present

## 2019-08-29 DIAGNOSIS — R4020 Unspecified coma: Secondary | ICD-10-CM | POA: Diagnosis not present

## 2019-08-29 DIAGNOSIS — E8809 Other disorders of plasma-protein metabolism, not elsewhere classified: Secondary | ICD-10-CM | POA: Diagnosis present

## 2019-08-29 DIAGNOSIS — J440 Chronic obstructive pulmonary disease with acute lower respiratory infection: Secondary | ICD-10-CM | POA: Diagnosis present

## 2019-08-29 DIAGNOSIS — R739 Hyperglycemia, unspecified: Secondary | ICD-10-CM | POA: Diagnosis present

## 2019-08-29 DIAGNOSIS — I462 Cardiac arrest due to underlying cardiac condition: Secondary | ICD-10-CM | POA: Diagnosis not present

## 2019-08-29 DIAGNOSIS — D638 Anemia in other chronic diseases classified elsewhere: Secondary | ICD-10-CM | POA: Diagnosis not present

## 2019-08-29 DIAGNOSIS — Z955 Presence of coronary angioplasty implant and graft: Secondary | ICD-10-CM

## 2019-08-29 DIAGNOSIS — I4510 Unspecified right bundle-branch block: Secondary | ICD-10-CM | POA: Diagnosis present

## 2019-08-29 DIAGNOSIS — N183 Chronic kidney disease, stage 3 unspecified: Secondary | ICD-10-CM | POA: Diagnosis present

## 2019-08-29 DIAGNOSIS — J189 Pneumonia, unspecified organism: Secondary | ICD-10-CM | POA: Diagnosis not present

## 2019-08-29 DIAGNOSIS — I472 Ventricular tachycardia: Secondary | ICD-10-CM | POA: Diagnosis not present

## 2019-08-29 DIAGNOSIS — I959 Hypotension, unspecified: Secondary | ICD-10-CM

## 2019-08-29 DIAGNOSIS — I5021 Acute systolic (congestive) heart failure: Secondary | ICD-10-CM | POA: Diagnosis not present

## 2019-08-29 DIAGNOSIS — D631 Anemia in chronic kidney disease: Secondary | ICD-10-CM | POA: Diagnosis present

## 2019-08-29 DIAGNOSIS — J9690 Respiratory failure, unspecified, unspecified whether with hypoxia or hypercapnia: Secondary | ICD-10-CM | POA: Diagnosis not present

## 2019-08-29 DIAGNOSIS — E872 Acidosis, unspecified: Secondary | ICD-10-CM | POA: Diagnosis present

## 2019-08-29 DIAGNOSIS — I4891 Unspecified atrial fibrillation: Secondary | ICD-10-CM | POA: Diagnosis not present

## 2019-08-29 DIAGNOSIS — I10 Essential (primary) hypertension: Secondary | ICD-10-CM | POA: Diagnosis present

## 2019-08-29 DIAGNOSIS — J9601 Acute respiratory failure with hypoxia: Secondary | ICD-10-CM | POA: Diagnosis not present

## 2019-08-29 DIAGNOSIS — I340 Nonrheumatic mitral (valve) insufficiency: Secondary | ICD-10-CM | POA: Diagnosis not present

## 2019-08-29 DIAGNOSIS — I2510 Atherosclerotic heart disease of native coronary artery without angina pectoris: Secondary | ICD-10-CM | POA: Diagnosis not present

## 2019-08-29 DIAGNOSIS — E1151 Type 2 diabetes mellitus with diabetic peripheral angiopathy without gangrene: Secondary | ICD-10-CM | POA: Diagnosis present

## 2019-08-29 DIAGNOSIS — R6521 Severe sepsis with septic shock: Secondary | ICD-10-CM | POA: Diagnosis not present

## 2019-08-29 DIAGNOSIS — E1122 Type 2 diabetes mellitus with diabetic chronic kidney disease: Secondary | ICD-10-CM | POA: Diagnosis not present

## 2019-08-29 DIAGNOSIS — J9811 Atelectasis: Secondary | ICD-10-CM | POA: Diagnosis not present

## 2019-08-29 DIAGNOSIS — I4892 Unspecified atrial flutter: Secondary | ICD-10-CM | POA: Diagnosis not present

## 2019-08-29 DIAGNOSIS — E1165 Type 2 diabetes mellitus with hyperglycemia: Secondary | ICD-10-CM | POA: Diagnosis present

## 2019-08-29 DIAGNOSIS — Z9119 Patient's noncompliance with other medical treatment and regimen: Secondary | ICD-10-CM

## 2019-08-29 DIAGNOSIS — I213 ST elevation (STEMI) myocardial infarction of unspecified site: Secondary | ICD-10-CM | POA: Diagnosis not present

## 2019-08-29 DIAGNOSIS — Z7982 Long term (current) use of aspirin: Secondary | ICD-10-CM

## 2019-08-29 DIAGNOSIS — I493 Ventricular premature depolarization: Secondary | ICD-10-CM | POA: Diagnosis present

## 2019-08-29 DIAGNOSIS — Z20822 Contact with and (suspected) exposure to covid-19: Secondary | ICD-10-CM | POA: Diagnosis not present

## 2019-08-29 DIAGNOSIS — N1832 Chronic kidney disease, stage 3b: Secondary | ICD-10-CM | POA: Diagnosis present

## 2019-08-29 DIAGNOSIS — I2489 Other forms of acute ischemic heart disease: Secondary | ICD-10-CM | POA: Diagnosis present

## 2019-08-29 DIAGNOSIS — I4901 Ventricular fibrillation: Secondary | ICD-10-CM | POA: Diagnosis not present

## 2019-08-29 DIAGNOSIS — Z95810 Presence of automatic (implantable) cardiac defibrillator: Secondary | ICD-10-CM

## 2019-08-29 DIAGNOSIS — I252 Old myocardial infarction: Secondary | ICD-10-CM

## 2019-08-29 DIAGNOSIS — D696 Thrombocytopenia, unspecified: Secondary | ICD-10-CM | POA: Diagnosis present

## 2019-08-29 DIAGNOSIS — I214 Non-ST elevation (NSTEMI) myocardial infarction: Secondary | ICD-10-CM | POA: Diagnosis not present

## 2019-08-29 DIAGNOSIS — J690 Pneumonitis due to inhalation of food and vomit: Secondary | ICD-10-CM | POA: Diagnosis not present

## 2019-08-29 DIAGNOSIS — R6520 Severe sepsis without septic shock: Secondary | ICD-10-CM | POA: Diagnosis not present

## 2019-08-29 DIAGNOSIS — D72829 Elevated white blood cell count, unspecified: Secondary | ICD-10-CM | POA: Diagnosis present

## 2019-08-29 DIAGNOSIS — I517 Cardiomegaly: Secondary | ICD-10-CM | POA: Diagnosis not present

## 2019-08-29 DIAGNOSIS — I739 Peripheral vascular disease, unspecified: Secondary | ICD-10-CM | POA: Diagnosis present

## 2019-08-29 DIAGNOSIS — A419 Sepsis, unspecified organism: Secondary | ICD-10-CM | POA: Diagnosis not present

## 2019-08-29 DIAGNOSIS — Z452 Encounter for adjustment and management of vascular access device: Secondary | ICD-10-CM | POA: Diagnosis not present

## 2019-08-29 DIAGNOSIS — R000 Tachycardia, unspecified: Secondary | ICD-10-CM | POA: Diagnosis not present

## 2019-08-29 DIAGNOSIS — J90 Pleural effusion, not elsewhere classified: Secondary | ICD-10-CM | POA: Diagnosis not present

## 2019-08-29 DIAGNOSIS — Z87891 Personal history of nicotine dependence: Secondary | ICD-10-CM

## 2019-08-29 DIAGNOSIS — N179 Acute kidney failure, unspecified: Secondary | ICD-10-CM | POA: Diagnosis present

## 2019-08-29 DIAGNOSIS — G9341 Metabolic encephalopathy: Secondary | ICD-10-CM | POA: Diagnosis present

## 2019-08-29 DIAGNOSIS — Z7901 Long term (current) use of anticoagulants: Secondary | ICD-10-CM

## 2019-08-29 DIAGNOSIS — R7989 Other specified abnormal findings of blood chemistry: Secondary | ICD-10-CM | POA: Diagnosis present

## 2019-08-29 DIAGNOSIS — R0902 Hypoxemia: Secondary | ICD-10-CM | POA: Diagnosis not present

## 2019-08-29 DIAGNOSIS — D649 Anemia, unspecified: Secondary | ICD-10-CM | POA: Diagnosis present

## 2019-08-29 DIAGNOSIS — R809 Proteinuria, unspecified: Secondary | ICD-10-CM | POA: Diagnosis present

## 2019-08-29 DIAGNOSIS — I4729 Other ventricular tachycardia: Secondary | ICD-10-CM

## 2019-08-29 DIAGNOSIS — I499 Cardiac arrhythmia, unspecified: Secondary | ICD-10-CM | POA: Diagnosis not present

## 2019-08-29 DIAGNOSIS — B999 Unspecified infectious disease: Secondary | ICD-10-CM | POA: Diagnosis not present

## 2019-08-29 DIAGNOSIS — Z7902 Long term (current) use of antithrombotics/antiplatelets: Secondary | ICD-10-CM | POA: Diagnosis not present

## 2019-08-29 DIAGNOSIS — Z79899 Other long term (current) drug therapy: Secondary | ICD-10-CM

## 2019-08-29 DIAGNOSIS — R404 Transient alteration of awareness: Secondary | ICD-10-CM | POA: Diagnosis not present

## 2019-08-29 LAB — ETHANOL: Alcohol, Ethyl (B): <10 mg/dL (ref <10)

## 2019-08-29 LAB — URINALYSIS, ROUTINE W REFLEX MICROSCOPIC
Bilirubin Urine: NEGATIVE
Glucose, UA: 150 mg/dL — AB
Ketones, ur: NEGATIVE mg/dL
Leukocytes,Ua: NEGATIVE
Nitrite: NEGATIVE
Protein, ur: 100 mg/dL — AB
Specific Gravity, Urine: 1.009 (ref 1.005–1.030)
pH: 7 (ref 5.0–8.0)

## 2019-08-29 LAB — COMPREHENSIVE METABOLIC PANEL
ALT: 64 U/L — ABNORMAL HIGH (ref 0–44)
ALT: 59 U/L — ABNORMAL HIGH (ref 0–44)
AST: 86 U/L — ABNORMAL HIGH (ref 15–41)
AST: 91 U/L — ABNORMAL HIGH (ref 15–41)
Albumin: 3.4 g/dL — ABNORMAL LOW (ref 3.5–5.0)
Albumin: 3.3 g/dL — ABNORMAL LOW (ref 3.5–5.0)
Alkaline Phosphatase: 82 U/L (ref 38–126)
Alkaline Phosphatase: 71 U/L (ref 38–126)
Anion gap: 21 — ABNORMAL HIGH (ref 5–15)
Anion gap: 20 — ABNORMAL HIGH (ref 5–15)
BUN: 25 mg/dL — ABNORMAL HIGH (ref 8–23)
BUN: 26 mg/dL — ABNORMAL HIGH (ref 8–23)
CO2: 24 mmol/L (ref 22–32)
CO2: 22 mmol/L (ref 22–32)
Calcium: 8.7 mg/dL — ABNORMAL LOW (ref 8.9–10.3)
Calcium: 8.1 mg/dL — ABNORMAL LOW (ref 8.9–10.3)
Chloride: 97 mmol/L — ABNORMAL LOW (ref 98–111)
Chloride: 97 mmol/L — ABNORMAL LOW (ref 98–111)
Creatinine, Ser: 1.69 mg/dL — ABNORMAL HIGH (ref 0.61–1.24)
Creatinine, Ser: 1.78 mg/dL — ABNORMAL HIGH (ref 0.61–1.24)
GFR calc Af Amer: 45 mL/min — ABNORMAL LOW (ref >60)
GFR calc Af Amer: 42 mL/min — ABNORMAL LOW (ref >60)
GFR calc non Af Amer: 39 mL/min — ABNORMAL LOW (ref >60)
GFR calc non Af Amer: 37 mL/min — ABNORMAL LOW (ref >60)
Glucose, Bld: 295 mg/dL — ABNORMAL HIGH (ref 70–99)
Glucose, Bld: 380 mg/dL — ABNORMAL HIGH (ref 70–99)
Potassium: 2.6 mmol/L — CL (ref 3.5–5.1)
Potassium: 3.1 mmol/L — ABNORMAL LOW (ref 3.5–5.1)
Sodium: 142 mmol/L (ref 135–145)
Sodium: 139 mmol/L (ref 135–145)
Total Bilirubin: 0.7 mg/dL (ref 0.3–1.2)
Total Bilirubin: 1.6 mg/dL — ABNORMAL HIGH (ref 0.3–1.2)
Total Protein: 6.7 g/dL (ref 6.5–8.1)
Total Protein: 6.4 g/dL — ABNORMAL LOW (ref 6.5–8.1)

## 2019-08-29 LAB — I-STAT ARTERIAL BLOOD GAS, ED
Acid-Base Excess: 7 mmol/L — ABNORMAL HIGH (ref 0.0–2.0)
Bicarbonate: 31.9 mmol/L — ABNORMAL HIGH (ref 20.0–28.0)
Calcium, Ion: 1.08 mmol/L — ABNORMAL LOW (ref 1.15–1.40)
HCT: 38 % — ABNORMAL LOW (ref 39.0–52.0)
Hemoglobin: 12.9 g/dL — ABNORMAL LOW (ref 13.0–17.0)
O2 Saturation: 100 %
Patient temperature: 95.6
Potassium: 2.4 mmol/L — CL (ref 3.5–5.1)
Sodium: 141 mmol/L (ref 135–145)
TCO2: 33 mmol/L — ABNORMAL HIGH (ref 22–32)
pCO2 arterial: 44 mmHg (ref 32.0–48.0)
pH, Arterial: 7.461 — ABNORMAL HIGH (ref 7.350–7.450)
pO2, Arterial: 404 mmHg — ABNORMAL HIGH (ref 83.0–108.0)

## 2019-08-29 LAB — CBC WITH DIFFERENTIAL/PLATELET
Abs Immature Granulocytes: 0 10*3/uL (ref 0.00–0.07)
Band Neutrophils: 11 %
Basophils Absolute: 0.2 10*3/uL — ABNORMAL HIGH (ref 0.0–0.1)
Basophils Relative: 1 %
Eosinophils Absolute: 0.5 10*3/uL (ref 0.0–0.5)
Eosinophils Relative: 2 %
HCT: 39 % (ref 39.0–52.0)
Hemoglobin: 12 g/dL — ABNORMAL LOW (ref 13.0–17.0)
Lymphocytes Relative: 24 %
Lymphs Abs: 5.8 10*3/uL — ABNORMAL HIGH (ref 0.7–4.0)
MCH: 28 pg (ref 26.0–34.0)
MCHC: 30.8 g/dL (ref 30.0–36.0)
MCV: 90.9 fL (ref 80.0–100.0)
Monocytes Absolute: 1 10*3/uL (ref 0.1–1.0)
Monocytes Relative: 4 %
Neutro Abs: 16.8 10*3/uL — ABNORMAL HIGH (ref 1.7–7.7)
Neutrophils Relative %: 58 %
Platelets: 218 10*3/uL (ref 150–400)
RBC: 4.29 MIL/uL (ref 4.22–5.81)
RDW: 14.7 % (ref 11.5–15.5)
WBC: 24.3 10*3/uL — ABNORMAL HIGH (ref 4.0–10.5)
nRBC: 0 % (ref 0.0–0.2)

## 2019-08-29 LAB — RAPID URINE DRUG SCREEN, HOSP PERFORMED
Amphetamines: NOT DETECTED
Barbiturates: NOT DETECTED
Benzodiazepines: NOT DETECTED
Cocaine: NOT DETECTED
Opiates: NOT DETECTED
Tetrahydrocannabinol: NOT DETECTED

## 2019-08-29 LAB — LACTIC ACID, PLASMA
Lactic Acid, Venous: 10.2 mmol/L (ref 0.5–1.9)
Lactic Acid, Venous: 6.6 mmol/L (ref 0.5–1.9)

## 2019-08-29 LAB — PROCALCITONIN: Procalcitonin: 0.63 ng/mL

## 2019-08-29 LAB — SALICYLATE LEVEL: Salicylate Lvl: <7 mg/dL — ABNORMAL LOW (ref 7.0–30.0)

## 2019-08-29 LAB — PROTIME-INR
INR: 1.2 (ref 0.8–1.2)
Prothrombin Time: 14.3 seconds (ref 11.4–15.2)

## 2019-08-29 LAB — TROPONIN I (HIGH SENSITIVITY)
Troponin I (High Sensitivity): 572 ng/L (ref <18)
Troponin I (High Sensitivity): 3785 ng/L (ref <18)

## 2019-08-29 LAB — ACETAMINOPHEN LEVEL: Acetaminophen (Tylenol), Serum: <10 ug/mL — ABNORMAL LOW (ref 10–30)

## 2019-08-29 LAB — ABO/RH: ABO/RH(D): B POS

## 2019-08-29 LAB — SARS CORONAVIRUS 2 BY RT PCR (HOSPITAL ORDER, PERFORMED IN ~~LOC~~ HOSPITAL LAB): SARS Coronavirus 2: NEGATIVE

## 2019-08-29 LAB — BRAIN NATRIURETIC PEPTIDE: B Natriuretic Peptide: 202.2 pg/mL — ABNORMAL HIGH (ref 0.0–100.0)

## 2019-08-29 IMAGING — CT CT CERVICAL SPINE W/O CM
3 of 4 series · 11 of 35 positions shown, 13 images · non-contrast
Comparison: None

CLINICAL DATA: Post cardiac arrest.

EXAM:
CT HEAD WITHOUT CONTRAST
CT CERVICAL SPINE WITHOUT CONTRAST
TECHNIQUE: Multidetector CT imaging of the head and cervical spine was
performed following the standard protocol without intravenous
contrast. Multiplanar CT image reconstructions of the cervical spine
were also generated.

[Series 4: c_spine 2.0 st · axial · 0.39mm/px · z∈[-265,-147]mm · 3 of 89 slices shown, 4 images]
[im 15/89  soft-tissue]
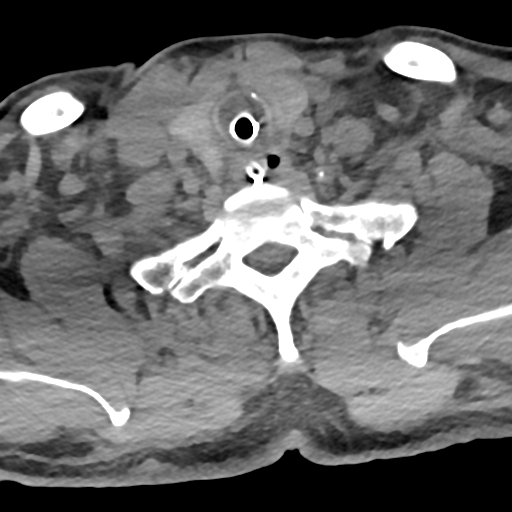
[im 15/89  bone]
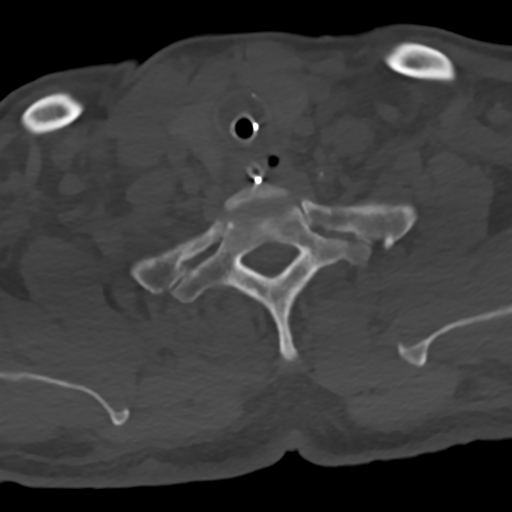
[im 45/89  bone]
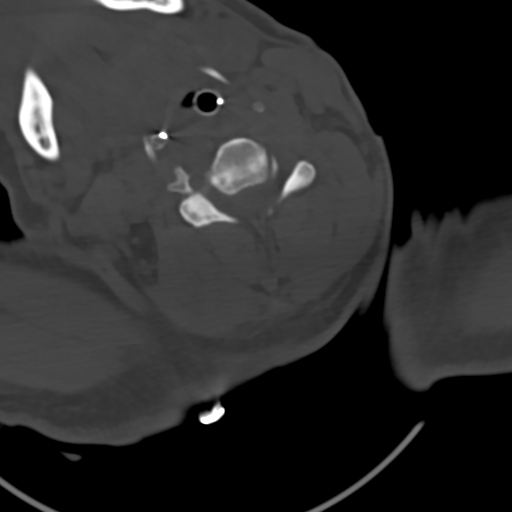
[im 74/89  bone]
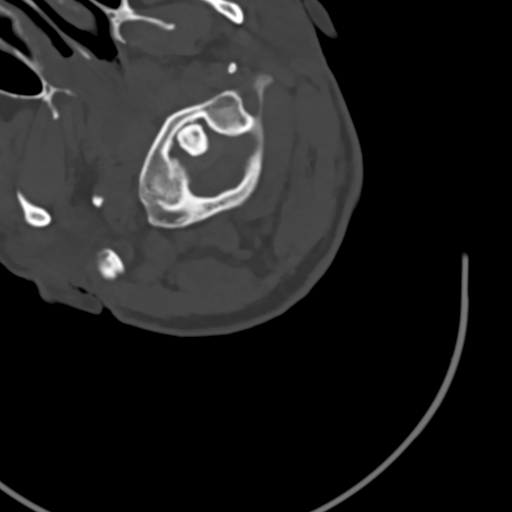

[Series 6: c_spine 2.0 sag bone · sagittal · 0.24mm/px · 5 of 61 slices shown, 6 images]
[im 21/61  bone]
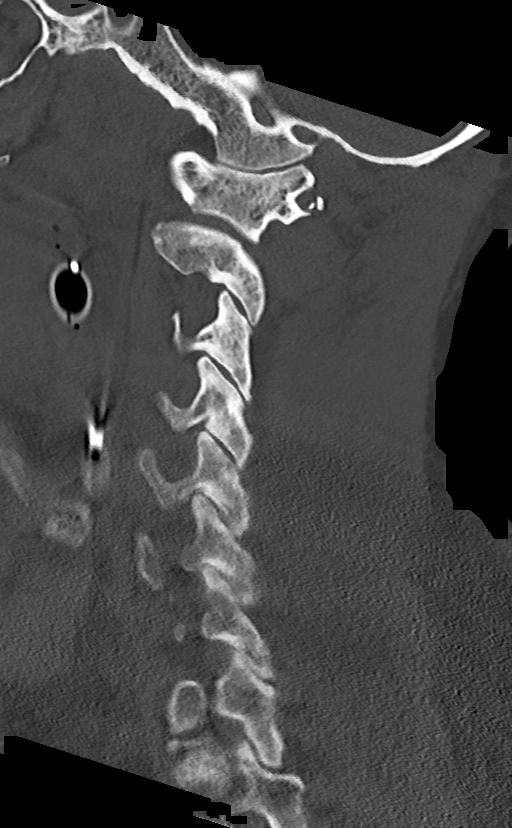
[im 26/61  bone]
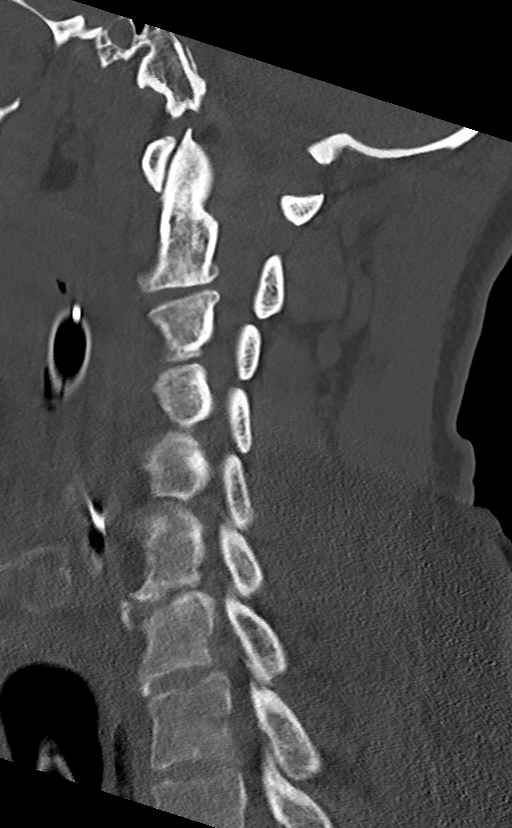
[im 31/61  soft-tissue]
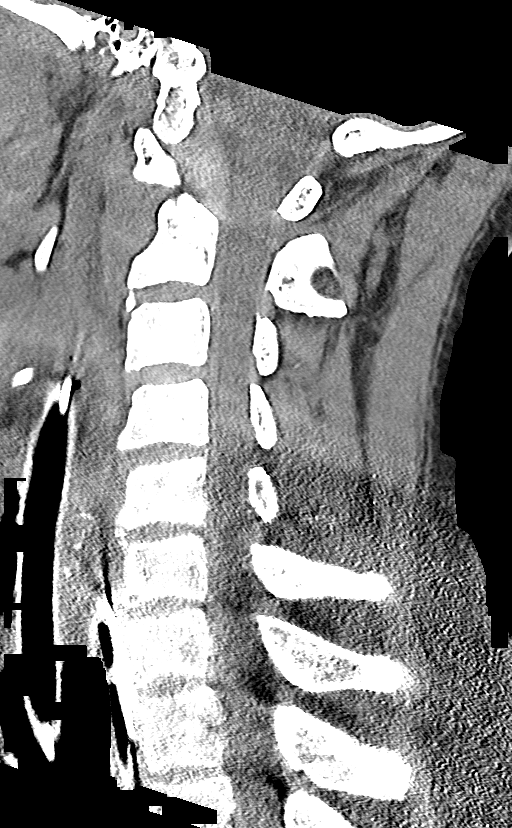
[im 31/61  bone]
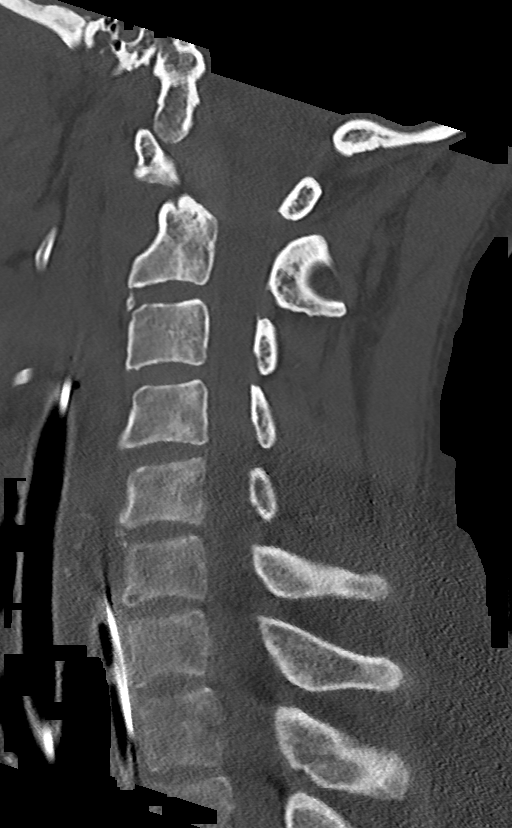
[im 36/61  bone]
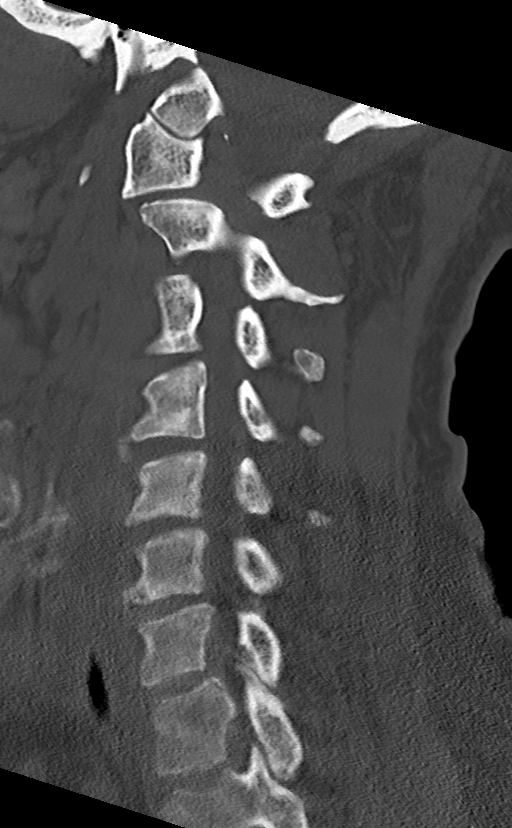
[im 41/61  bone]
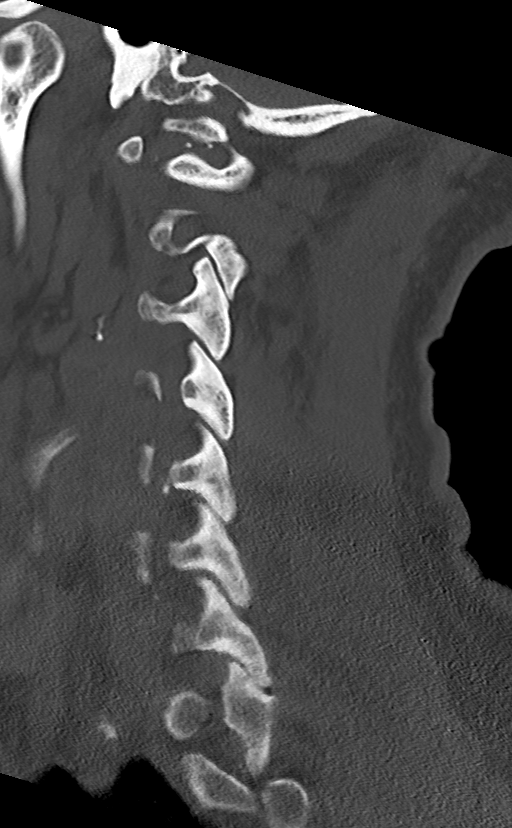

[Series 7: c_spine 2.0 cor bone · coronal · 0.23mm/px · 3 of 62 slices shown]
[im 13/62  bone]
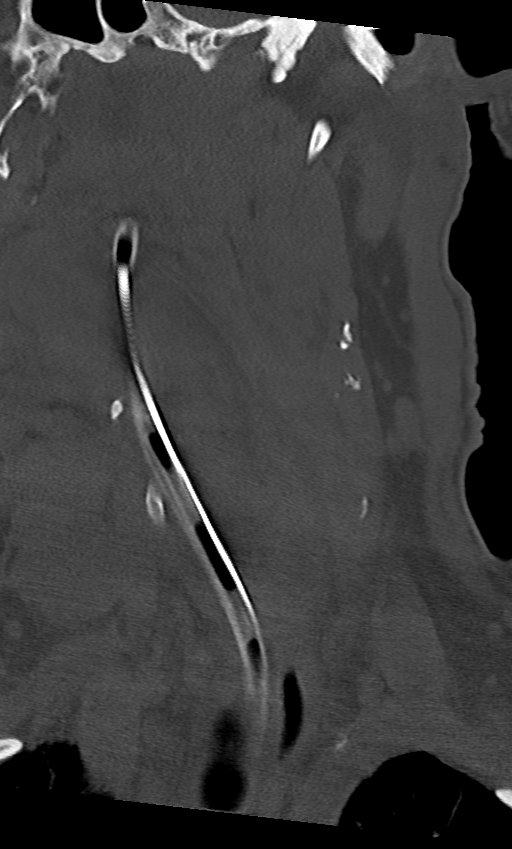
[im 25/62  bone]
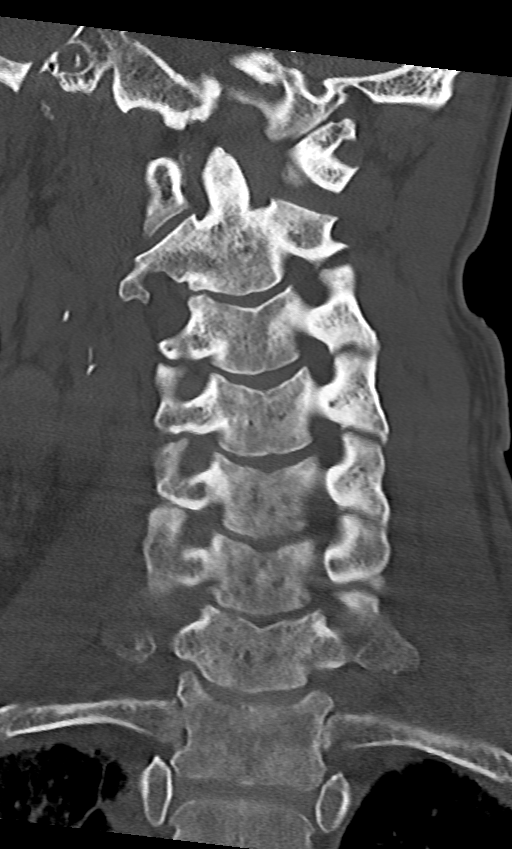
[im 37/62  bone]
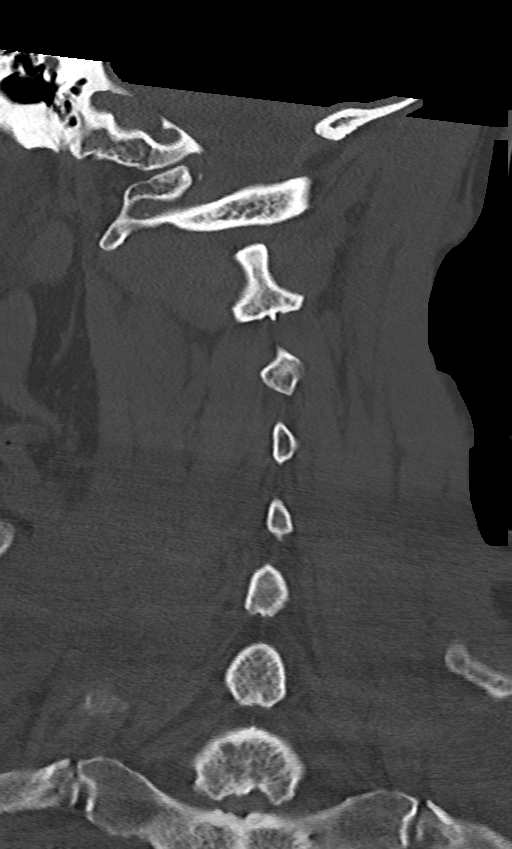

[11 of 35 positions shown; findings below may reference images not displayed]

FINDINGS: CT HEAD FINDINGS

Brain: No evidence of acute infarction, hemorrhage, hydrocephalus,
extra-axial collection or mass lesion/mass effect. Subtle focus of
added density in the LEFT basal ganglia likely asymmetric
calcification is. Mild white matter disease.

Vascular: No hyperdense vessel or unexpected calcification.

Skull: Normal. Negative for fracture or focal lesion.

Sinuses/Orbits: Mild mucosal thickening of ethmoid sinuses. Sinuses
are incompletely imaged. Small amount of fluid in the posterior
ethmoids likely related to indwelling endotracheal tube.

Other: None.

CT CERVICAL SPINE FINDINGS

Alignment: Normal.  Head is turned to the RIGHT.

Skull base and vertebrae: No acute fracture. No primary bone lesion
or focal pathologic process.

Soft tissues and spinal canal: Endotracheal tube in place. Gastric
tube in place, tubes are incompletely imaged. Prevertebral soft
tissues with limited assessment due to indwelling tubes.

Disc levels: Multilevel degenerative changes greatest at C4-5 C5-6
and C6-7 with small anterior osteophytes.

Upper chest: Scarring in the upper chest at the RIGHT lung apex.
Extensive areas of parenchymal opacity were seen bilaterally in the
chest on previous chest CT.

Other: None
IMPRESSION: 1. No acute intracranial abnormality.
2. No acute spine fracture.
3. Multilevel degenerative changes of the cervical spine.
4. Apical scarring in the chest in the RIGHT upper lobe in this
patient with diffuse parenchymal disease on previous chest CT.
5. Aortic atherosclerosis.

Aortic Atherosclerosis ([TO]-[TO]).

## 2019-08-29 IMAGING — DX DG CHEST 1V PORT
1 series · 1 of 1 positions shown · non-contrast
Comparison: [DATE]

CLINICAL DATA: Central line placement.

EXAM:
PORTABLE CHEST 1 VIEW

[chest]
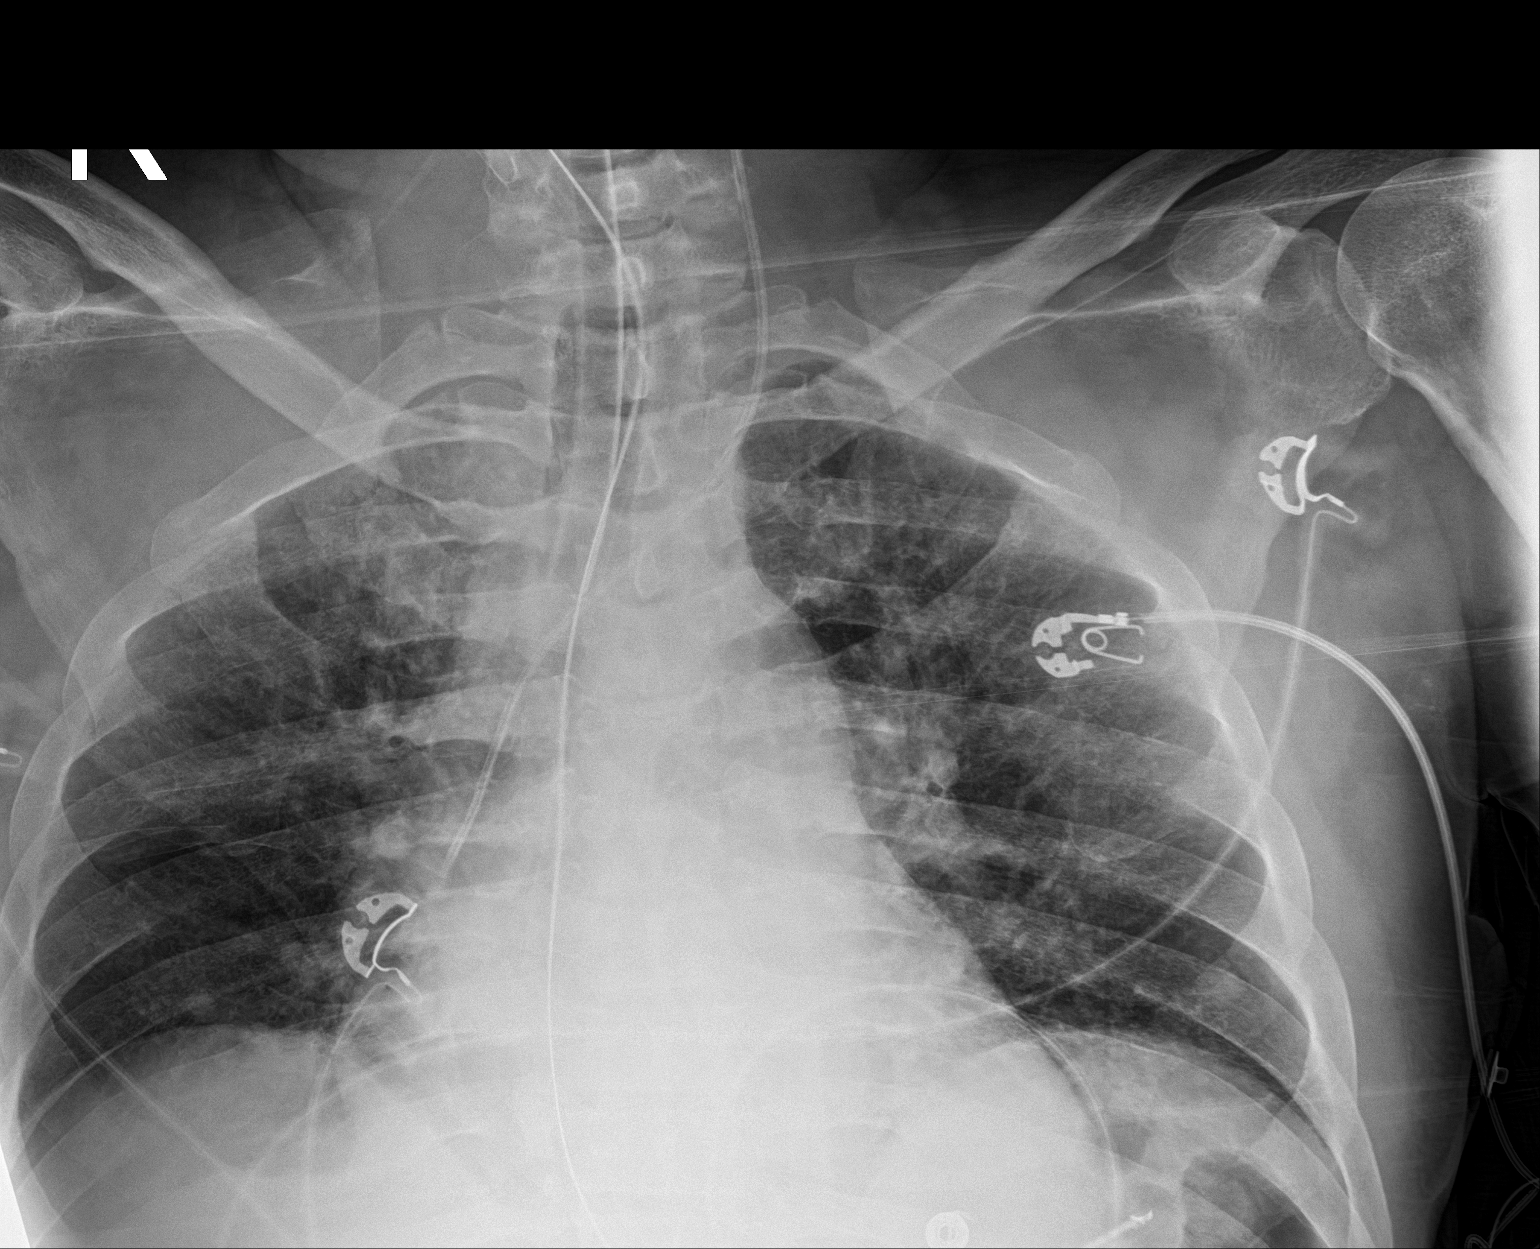

[1 of 1 positions shown; findings below may reference images not displayed]

FINDINGS: A left internal jugular venous catheter is seen with its distal tip
noted near the junction of the superior vena cava and right atrium.
Stable endotracheal tube and nasogastric tube positioning is noted.
Mild stable bilateral airspace disease is seen. The heart size and
mediastinal contours are within normal limits. The visualized
skeletal structures are unremarkable.
IMPRESSION: Interval left internal jugular venous catheter placement
positioning, as described above, when compared to the prior study.

## 2019-08-29 IMAGING — DX DG CHEST 1V PORT
1 series · 1 of 1 positions shown · non-contrast
Comparison: Single-view of the chest [DATE] and PA and lateral
chest [DATE]. CT chest [DATE].

CLINICAL DATA: Patient status post intubation and OG tube
placement.

EXAM:
PORTABLE CHEST 1 VIEW

[chest ap]
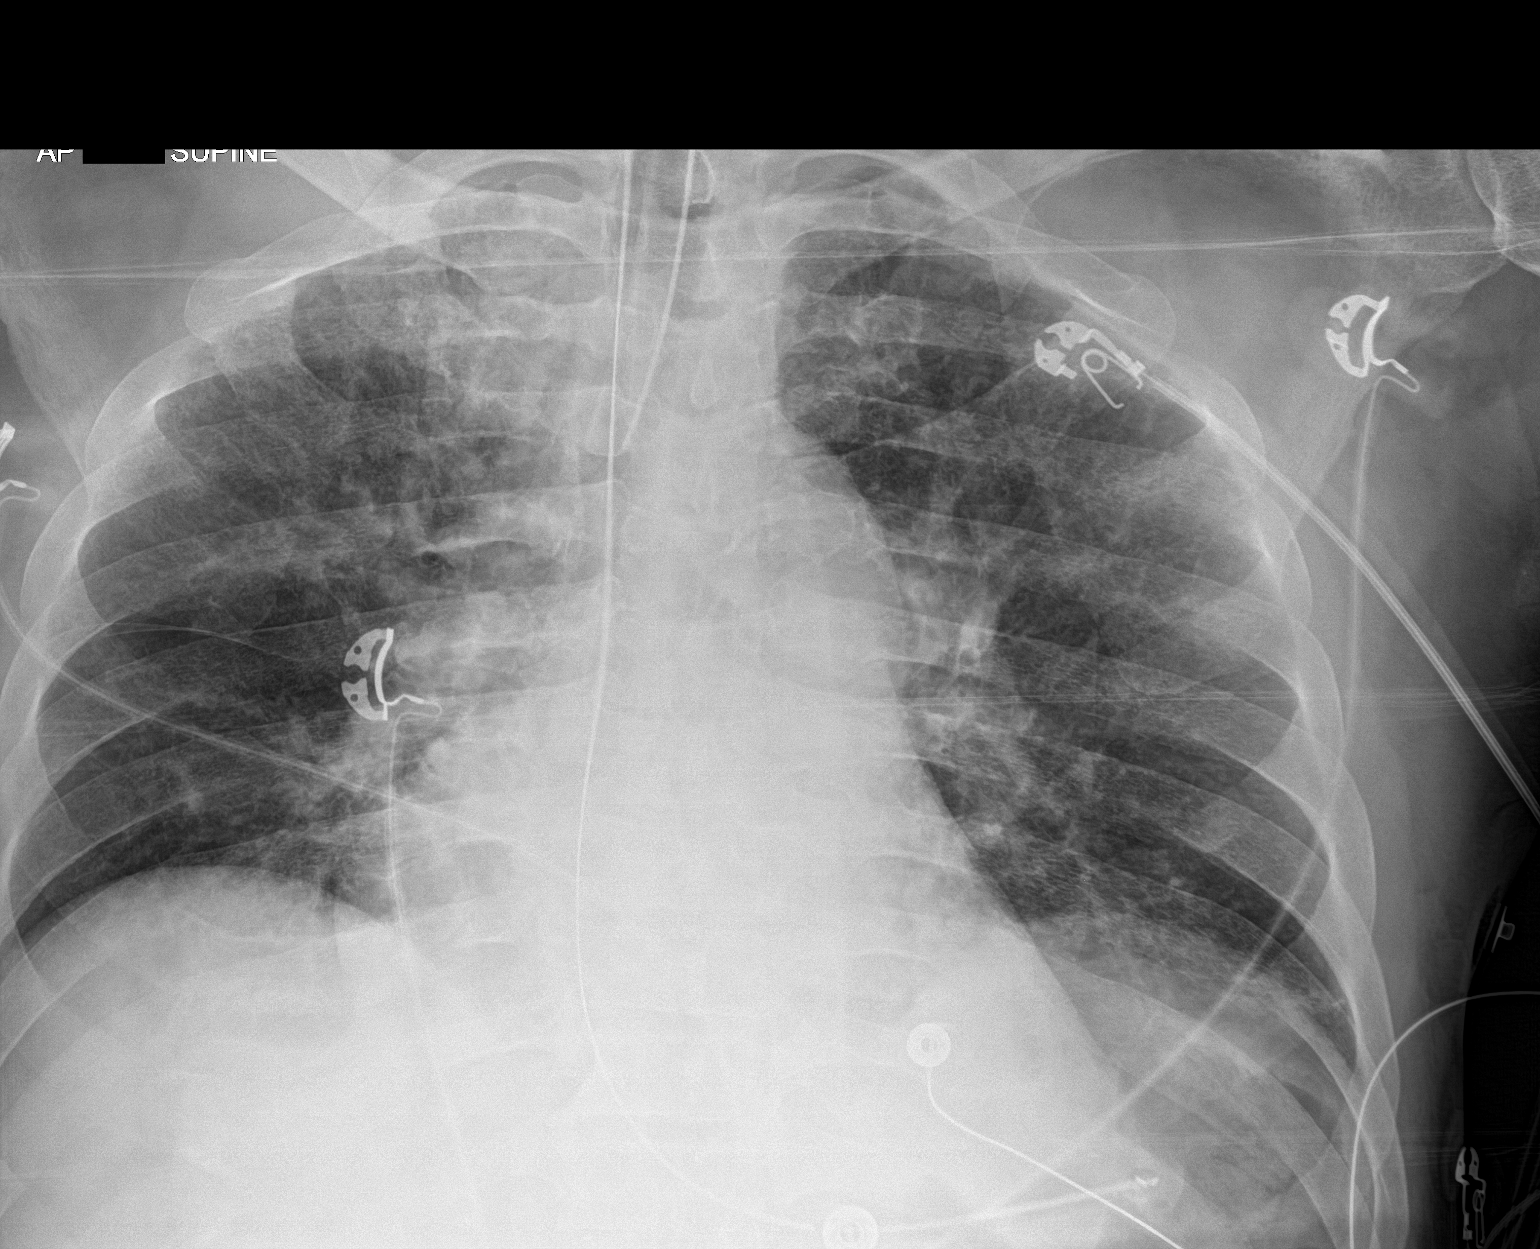

[1 of 1 positions shown; findings below may reference images not displayed]

FINDINGS: Endotracheal tube is in place with the tip 3.3 cm above the carina.
OG tube tip is seen in the fundus of the stomach. There is mild
cardiomegaly. Atherosclerosis is seen. Patchy airspace disease is
seen in the right upper lung zone and throughout the left chest. No
pneumothorax or pleural fluid. No acute or focal bony abnormality.
IMPRESSION: OG tube and ET tube in good position.

Left worse than right patchy airspace disease most worrisome for
pneumonia.

## 2019-08-29 IMAGING — CT CT HEAD W/O CM
2 series · 15 of 30 positions shown, 17 images · non-contrast
Comparison: None

CLINICAL DATA: Post cardiac arrest.

EXAM:
CT HEAD WITHOUT CONTRAST
CT CERVICAL SPINE WITHOUT CONTRAST
TECHNIQUE: Multidetector CT imaging of the head and cervical spine was
performed following the standard protocol without intravenous
contrast. Multiplanar CT image reconstructions of the cervical spine
were also generated.

[Series 3: head without · axial · non-contrast · 0.46mm/px · z∈[-122,-12]mm · 7 of 30 slices shown, 9 images]
[im 4/30  brain]
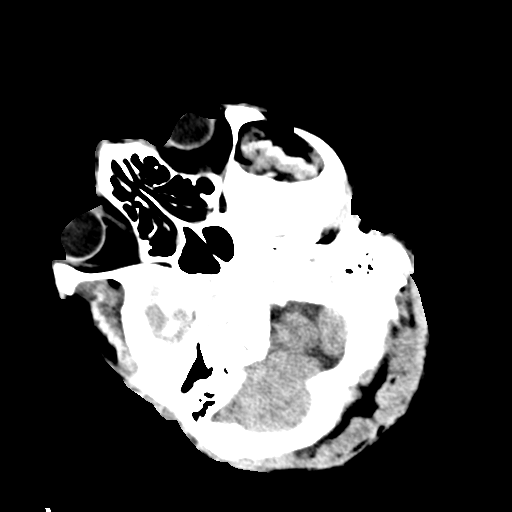
[im 4/30  bone]
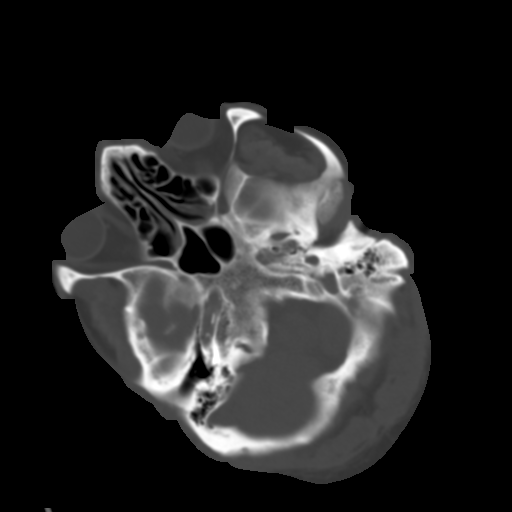
[im 8/30  brain]
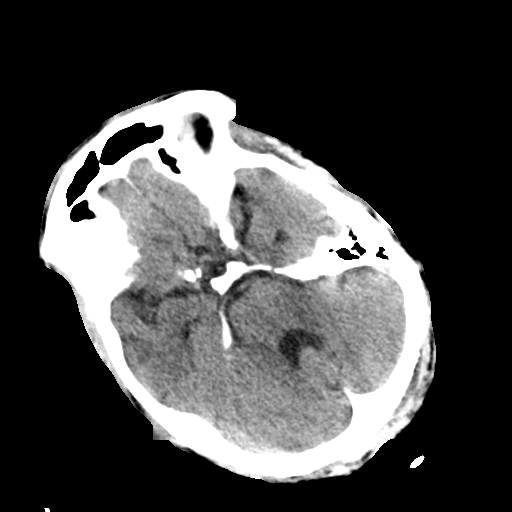
[im 11/30  brain]
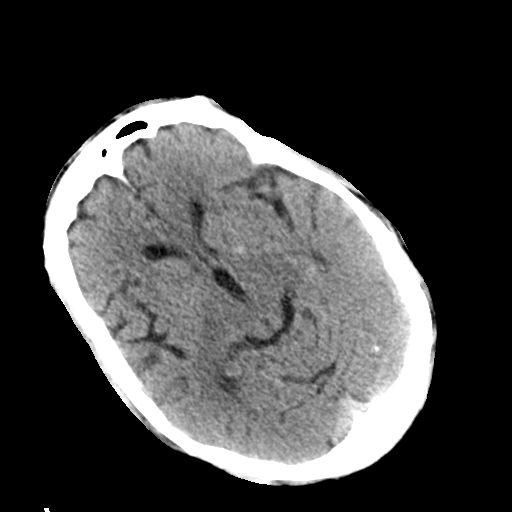
[im 15/30  brain]
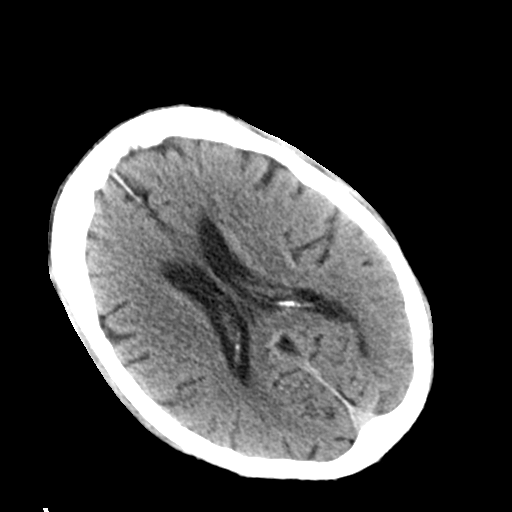
[im 19/30  brain]
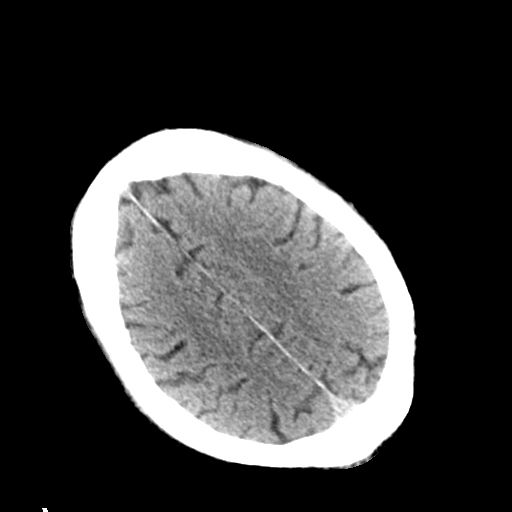
[im 19/30  bone]
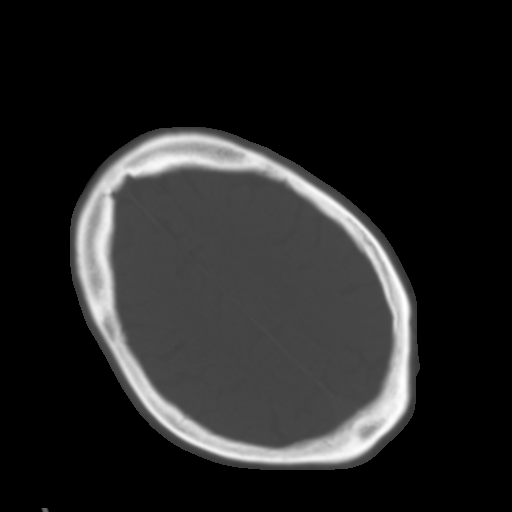
[im 22/30  brain]
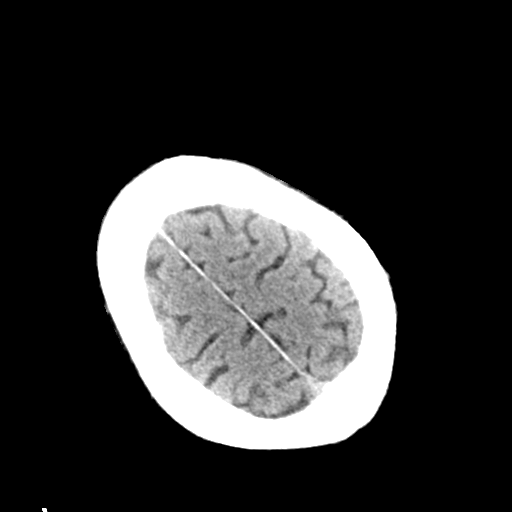
[im 26/30  brain]
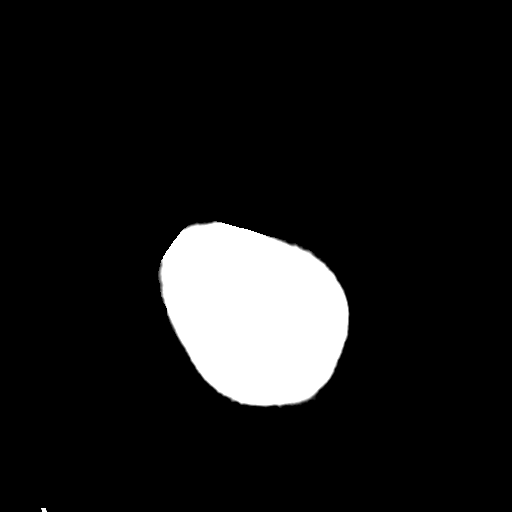

[Series 4: head bone · axial · 0.46mm/px · z∈[-124,-6]mm · 8 of 75 slices shown]
[im 8/75  bone]
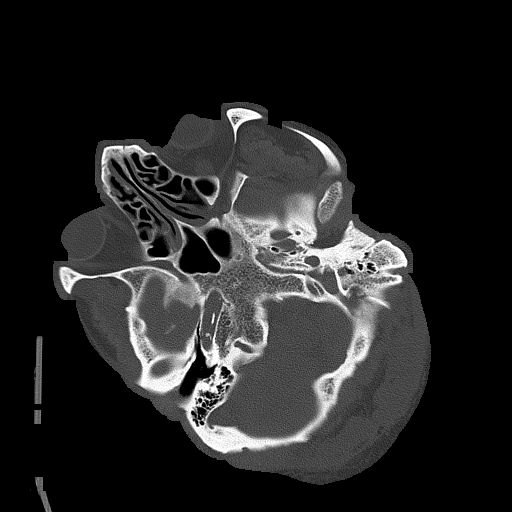
[im 15/75  bone]
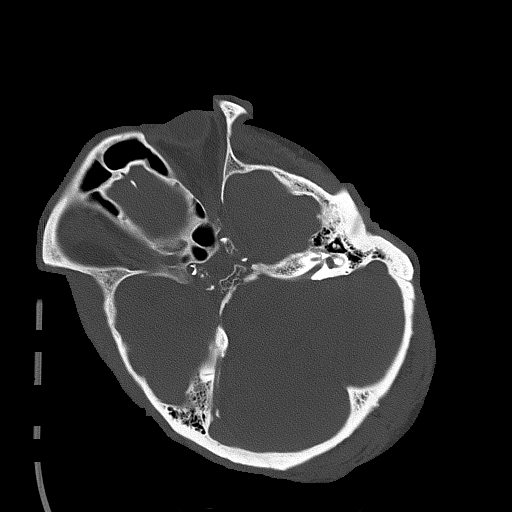
[im 23/75  bone]
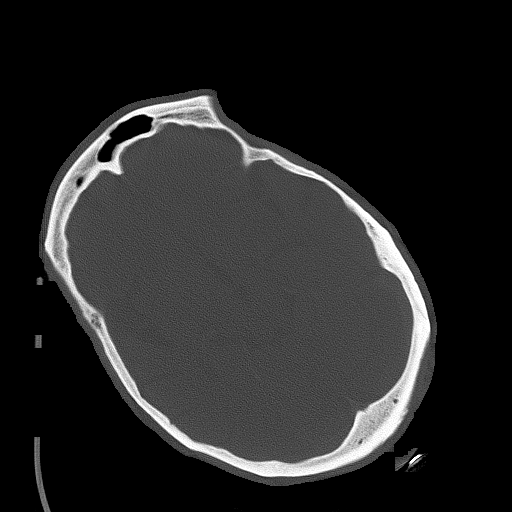
[im 34/75  bone]
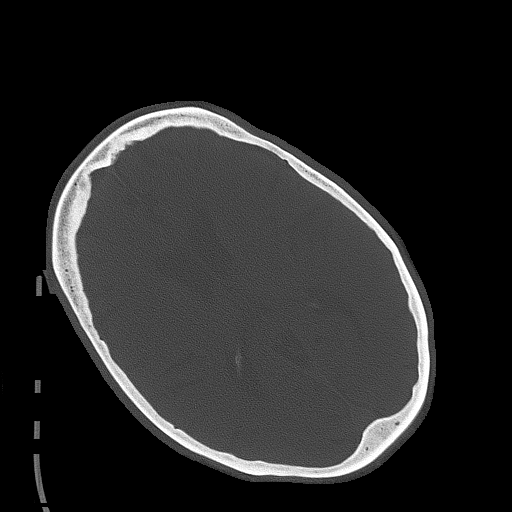
[im 41/75  bone]
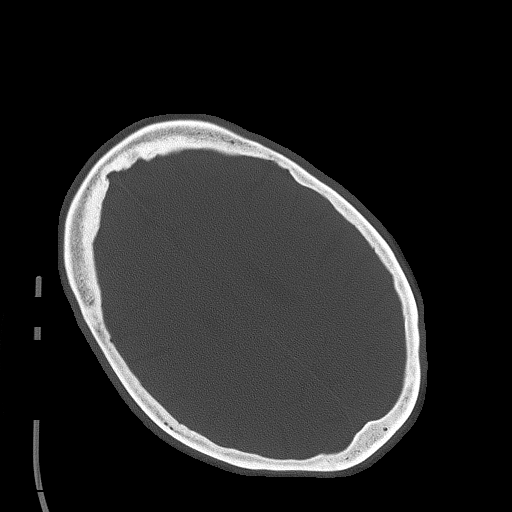
[im 52/75  bone]
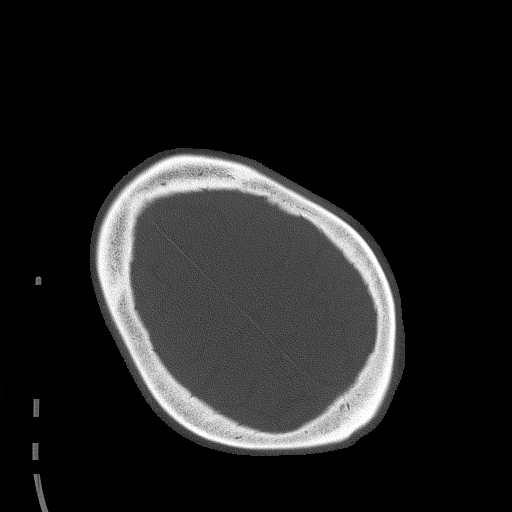
[im 60/75  bone]
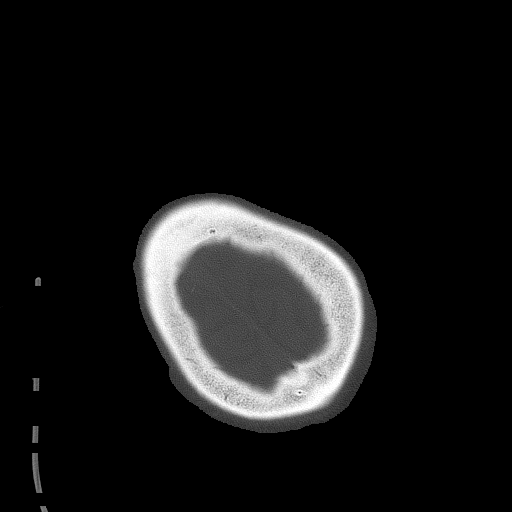
[im 67/75  bone]
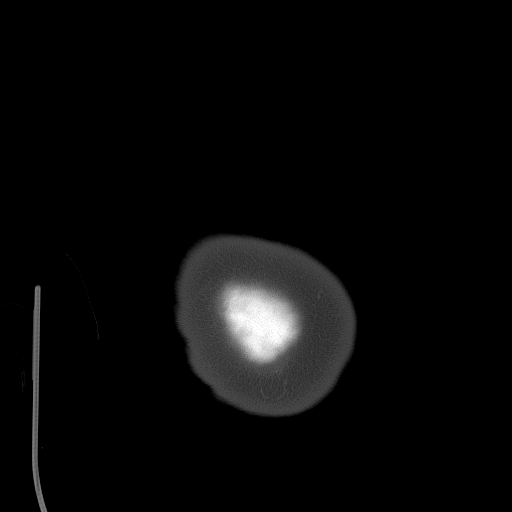

[15 of 30 positions shown; findings below may reference images not displayed]

FINDINGS: CT HEAD FINDINGS

Brain: No evidence of acute infarction, hemorrhage, hydrocephalus,
extra-axial collection or mass lesion/mass effect. Subtle focus of
added density in the LEFT basal ganglia likely asymmetric
calcification is. Mild white matter disease.

Vascular: No hyperdense vessel or unexpected calcification.

Skull: Normal. Negative for fracture or focal lesion.

Sinuses/Orbits: Mild mucosal thickening of ethmoid sinuses. Sinuses
are incompletely imaged. Small amount of fluid in the posterior
ethmoids likely related to indwelling endotracheal tube.

Other: None.

CT CERVICAL SPINE FINDINGS

Alignment: Normal.  Head is turned to the RIGHT.

Skull base and vertebrae: No acute fracture. No primary bone lesion
or focal pathologic process.

Soft tissues and spinal canal: Endotracheal tube in place. Gastric
tube in place, tubes are incompletely imaged. Prevertebral soft
tissues with limited assessment due to indwelling tubes.

Disc levels: Multilevel degenerative changes greatest at C4-5 C5-6
and C6-7 with small anterior osteophytes.

Upper chest: Scarring in the upper chest at the RIGHT lung apex.
Extensive areas of parenchymal opacity were seen bilaterally in the
chest on previous chest CT.

Other: None
IMPRESSION: 1. No acute intracranial abnormality.
2. No acute spine fracture.
3. Multilevel degenerative changes of the cervical spine.
4. Apical scarring in the chest in the RIGHT upper lobe in this
patient with diffuse parenchymal disease on previous chest CT.
5. Aortic atherosclerosis.

Aortic Atherosclerosis ([TO]-[TO]).

## 2019-08-29 MED ORDER — FENTANYL CITRATE (PF) 100 MCG/2ML IJ SOLN
INTRAMUSCULAR | Status: AC
  Administered 2019-08-29: 50 ug via INTRAVENOUS
  Filled 2019-08-29: qty 2

## 2019-08-29 MED ORDER — FENTANYL CITRATE (PF) 100 MCG/2ML IJ SOLN: 25.0000 ug | INTRAMUSCULAR | Status: DC | PRN

## 2019-08-29 MED ORDER — PROPOFOL 1000 MG/100ML IV EMUL
0.0000 ug/kg/min | INTRAVENOUS | Status: DC
  Administered 2019-08-29: 20 ug/kg/min via INTRAVENOUS
  Administered 2019-08-30 (×2): 30 ug/kg/min via INTRAVENOUS
  Filled 2019-08-29 (×3): qty 100

## 2019-08-29 MED ORDER — SODIUM CHLORIDE 0.9 % IV SOLN
100.0000 mg | Freq: Two times a day (BID) | INTRAVENOUS | Status: DC
  Administered 2019-08-29: 100 mg via INTRAVENOUS
  Filled 2019-08-29 (×3): qty 100

## 2019-08-29 MED ORDER — VANCOMYCIN HCL 1750 MG/350ML IV SOLN
1750.0000 mg | Freq: Once | INTRAVENOUS | Status: AC
  Administered 2019-08-29: 1750 mg via INTRAVENOUS
  Filled 2019-08-29: qty 350

## 2019-08-29 MED ORDER — POTASSIUM CHLORIDE 10 MEQ/100ML IV SOLN
10.0000 meq | INTRAVENOUS | Status: AC
  Administered 2019-08-29 – 2019-08-30 (×4): 10 meq via INTRAVENOUS
  Filled 2019-08-29 (×5): qty 100

## 2019-08-29 MED ORDER — LACTATED RINGERS IV BOLUS
1000.0000 mL | Freq: Once | INTRAVENOUS | Status: AC
  Administered 2019-08-29: 1000 mL via INTRAVENOUS

## 2019-08-29 MED ORDER — NOREPINEPHRINE 4 MG/250ML-% IV SOLN
0.0000 ug/min | INTRAVENOUS | Status: DC
  Administered 2019-08-29: 20 ug/min via INTRAVENOUS
  Administered 2019-08-30: 32 ug/min via INTRAVENOUS
  Administered 2019-08-30: 30 ug/min via INTRAVENOUS
  Administered 2019-08-30: 32 ug/min via INTRAVENOUS
  Filled 2019-08-29: qty 250
  Filled 2019-08-29: qty 500
  Filled 2019-08-29 (×2): qty 250

## 2019-08-29 MED ORDER — POTASSIUM CHLORIDE 10 MEQ/100ML IV SOLN
10.0000 meq | INTRAVENOUS | Status: AC
  Administered 2019-08-29 (×3): 10 meq via INTRAVENOUS
  Filled 2019-08-29: qty 100

## 2019-08-29 MED ORDER — SODIUM CHLORIDE 0.9 % IV SOLN
2.0000 g | Freq: Two times a day (BID) | INTRAVENOUS | Status: DC
  Administered 2019-08-30: 2 g via INTRAVENOUS
  Filled 2019-08-29 (×5): qty 2

## 2019-08-29 MED ORDER — DOCUSATE SODIUM 100 MG PO CAPS
100.0000 mg | ORAL_CAPSULE | Freq: Two times a day (BID) | ORAL | Status: DC | PRN
  Administered 2019-09-03: 100 mg via ORAL
  Filled 2019-08-29: qty 1

## 2019-08-29 MED ORDER — POLYETHYLENE GLYCOL 3350 17 G PO PACK: 17.0000 g | PACK | Freq: Every day | ORAL | Status: DC | PRN

## 2019-08-29 MED ORDER — PIPERACILLIN-TAZOBACTAM 3.375 G IVPB 30 MIN
3.3750 g | Freq: Once | INTRAVENOUS | Status: AC
  Administered 2019-08-29: 3.375 g via INTRAVENOUS
  Filled 2019-08-29: qty 50

## 2019-08-29 MED ORDER — DOCUSATE SODIUM 50 MG/5ML PO LIQD
100.0000 mg | Freq: Two times a day (BID) | ORAL | Status: DC
  Administered 2019-08-30 – 2019-09-02 (×6): 100 mg via ORAL
  Filled 2019-08-29 (×15): qty 10

## 2019-08-29 MED ORDER — NOREPINEPHRINE 4 MG/250ML-% IV SOLN
INTRAVENOUS | Status: AC
  Administered 2019-08-29: 15 ug/min via INTRAVENOUS
  Filled 2019-08-29: qty 250

## 2019-08-29 MED ORDER — FENTANYL CITRATE (PF) 100 MCG/2ML IJ SOLN: 50.0000 ug | INTRAMUSCULAR | Status: DC | PRN

## 2019-08-29 MED ORDER — LACTATED RINGERS IV BOLUS
500.0000 mL | Freq: Once | INTRAVENOUS | Status: AC
  Administered 2019-08-29: 500 mL via INTRAVENOUS

## 2019-08-29 MED ORDER — POLYETHYLENE GLYCOL 3350 17 G PO PACK
17.0000 g | PACK | Freq: Every day | ORAL | Status: DC
  Administered 2019-08-30: 17 g via ORAL
  Filled 2019-08-29: qty 1

## 2019-08-29 NOTE — H&P (Signed)
NAME:  Peter Lambert, MRN:  [id number], DOB:  22-Jan-1946, LOS: 0 ADMISSION DATE:  08/29/2019, CONSULTATION DATE:  08/29/19 REFERRING MD: Reather Converse ,ED , CHIEF COMPLAINT:  Cardiac arrest, out of hospital  Brief History    74 year old man with hx of CAD, COPD, here with after out of hospital Vfib cardiac arrest, likely sepsis/CAP, minimally responsive to voice, non purposeful.   History of present illness   Peter Lambert is a 74 year old man with a history of COPD, CAD, s/p DES to LAD in 2010, HLD, HTN, DM2, PAD, here after witnessed cardiac arrest.    Was sitting with a friend, appeared normal, when he suddenly lost consciousness at around 4pm.   She did not do CPR, called EMS.  Unknown duration of downtime.  History comes from his son, Peter Lambert who was not present at the time.  He was told that patient suddenly began having difficulty speaking then became suddenly unconscious.   Reportedly he was shocked x2 for V fib arrest, followed by Epinephrine x 5, amiodarone, bicarbonate, and eventual ROSC.   Per son Peter Lambert, who lives with him, he has been working and acting normally, no complaints at all when he last saw him at about 145 this afternoon.  No recent cough, dyspnea, chest pain, indigestion.   Was gagging on the king airway on arrival, which was removed.  He was reintubated in the ED for AMS (responding to sternal rub but not purposeful), and hypoxia at about 87%.    In ed, started on levophed after sedation.    Peter Lambert was hospitalized with COPD exacerbation in 2/21, at which time he was sent home with O2.  He works at [employer] (overnight shifts, worked [employer]) and also is fully independent, though he is not very physically active.    Cardiology called initially for possible STEMI; they found EKG not c/w STEMI.    Past Medical History  COPD (recent exacerbation 03/2019, sent home on oxygen, had only been using at home for past weeks) CAD, s/p DES to LAD in 2010, HLD, HTN, DM2,  PAD Former tobacco use, quit 03/2019  Significant Hospital Events     Consults:    Procedures:  Intubation 7/3 Central line 7/3  Significant Diagnostic Tests:  CT head neg, CT cervical spine- neg   Micro Data:  Blood cultures 7/3   Antimicrobials:  Zosyn 7/3 Vanc 7/3 Doxy 7/3 -  Cefepime 7/3  Interim history/subjective:    Objective   Blood pressure (!) 72/53, pulse 85, temperature (!) 95.4 F (35.2 C), resp. rate 15, height 5\' 8"  (1.727 m), weight 81 kg, SpO2 97 %.    Vent Mode: PRVC FiO2 (%):  [50 %-100 %] 50 % Set Rate:  [15 bmp] 15 bmp Vt Set:  [540 mL] 540 mL PEEP:  [5 cmH20] 5 cmH20 Plateau Pressure:  [16 cmH20-17 cmH20] 17 cmH20   Intake/Output Summary (Last 24 hours) at 08/29/2019 2146 Last data filed at 08/29/2019 2041 Gross per 24 hour  Intake 150 ml  Output --  Net 150 ml   Filed Weights   08/29/19 1812  Weight: 81 kg    Examination: received fentanyl 45mcg prior to exam.  General: intubated, opens eyes and turns head toward voice of son, not following simple commands HENT: NCAT, perrl, mmm Lungs: Rhonchi predominantly on the L chest  Cardiovascular: RRR no mgr Abdomen: No obvious tenderness, ND, NBS, midline incision scar Extremities: no edema, no erythema Neuro: opens  eyes to voice, not following commands, moving upper extremities spontaneously, coughing intermittently, appears uncomfortable when awake.   Resolved Hospital Problem list     Assessment & Plan:  V fib cardiac arrest:  s/p prolonged out of hospital cardiac arrest.  Unknown downtime.  Cause not entirely clear, as he was apparently asymptomatic prior to arrest, though exact details unclear.  PNA/CAP and hypokalemia likely contributing.   Cardiology evaluated initially, did not think STEMI present or emergent cath needed.  Satting 100% on 50% FIO2.   Echo P  Hypokalemia: On lasix at home.  Unclear cause.   Replete.    Sepsis/septic shock: Given zosyn and vanc in ED.  Will  cont treatment for CAP.  Cefepime, doxycycline (cover for atypicals).  Levophed. Central line placed.  AKI: Cr 1.69.  UOP good.   COPD: duonebs prn.  Not on inhaled medications by my review.    HTN: held antiHTNs  HLD: lipitor at home.   CAD: des to LAD in 2010. Discussed with on call cardiology.   On Plavix and ASA.    Best practice:  Diet: NPO Pain/Anxiety/Delirium protocol (if indicated): propofol, prn fentanyl  VAP protocol (if indicated):  DVT prophylaxis: heparin GI prophylaxis: ppi Glucose control: ssi Mobility: bedrest Code Status: Full. Discussed with son Peter Lambert (goes by middle name Rick), and Peter Lambert.  Would like to cont full code and reassess in 24 h.   Family Communication: discussed plan with sons Disposition:   Labs   CBC: Recent Labs  Lab 08/29/19 1819 08/29/19 1907  WBC 24.3*  --   NEUTROABS 16.8*  --   HGB 12.0* 12.9*  HCT 39.0 38.0*  MCV 90.9  --   PLT 218  --     Basic Metabolic Panel: Recent Labs  Lab 08/29/19 1819 08/29/19 1907  NA 142 141  K 2.6* 2.4*  CL 97*  --   CO2 24  --   GLUCOSE 295*  --   BUN 25*  --   CREATININE 1.69*  --   CALCIUM 8.7*  --    GFR: Estimated Creatinine Clearance: 36.5 mL/min (A) (by C-G formula based on SCr of 1.69 mg/dL (H)). Recent Labs  Lab 08/29/19 1819  WBC 24.3*  LATICACIDVEN 10.2*    Liver Function Tests: Recent Labs  Lab 08/29/19 1819  AST 86*  ALT 64*  ALKPHOS 82  BILITOT 0.7  PROT 6.7  ALBUMIN 3.4*   No results for input(s): LIPASE, AMYLASE in the last 168 hours. No results for input(s): AMMONIA in the last 168 hours.  ABG    Component Value Date/Time   PHART 7.461 (H) 08/29/2019 1907   PCO2ART 44.0 08/29/2019 1907   PO2ART 404 (H) 08/29/2019 1907   HCO3 31.9 (H) 08/29/2019 1907   TCO2 33 (H) 08/29/2019 1907   ACIDBASEDEF 2.3 (H) 04/20/2019 0902   O2SAT 100.0 08/29/2019 1907     Coagulation Profile: Recent Labs  Lab 08/29/19 1819  INR 1.2    Cardiac Enzymes: No  results for input(s): CKTOTAL, CKMB, CKMBINDEX, TROPONINI in the last 168 hours.  HbA1C: Hgb A1c MFr Bld  Date/Time Value Ref Range Status  04/20/2019 02:55 PM 7.6 (H) 4.8 - 5.6 % Final    Comment:    (NOTE) Pre diabetes:          5.7%-6.4% Diabetes:              >6.4% Glycemic control for   <7.0% adults with diabetes   07/02/2016 05:40 PM  7.0 (H) 4.8 - 5.6 % Final    Comment:    (NOTE)         Pre-diabetes: 5.7 - 6.4         Diabetes: >6.4         Glycemic control for adults with diabetes: <7.0     CBG: No results for input(s): GLUCAP in the last 168 hours.  Review of Systems:   Unable to assess  Past Medical History  He,  has a past medical history of Coronary artery disease, Hyperlipidemia, Hypertension, Hypertension, MI, old, Peripheral arterial disease ([place]), Tobacco abuse, and Type 2 diabetes mellitus ([place]).   Surgical History    Past Surgical History:  Procedure Laterality Date  . CARDIAC SURGERY    . CORONARY STENT PLACEMENT       Social History   reports that he has quit smoking. His smoking use included cigarettes. He has a 26.00 pack-year smoking history. He has never used smokeless tobacco. He reports that he does not drink alcohol and does not use drugs.   Family History   His family history includes Cancer in his mother.   Allergies No Known Allergies   Home Medications  Prior to Admission medications   Medication Sig Start Date End Date Taking? Authorizing Provider  acetaminophen (TYLENOL) 325 MG tablet Take 2 tablets (650 mg total) by mouth every 6 (six) hours as needed for mild pain, fever or headache (or Fever >/= 101). 04/28/19   Roxan Hockey, MD  albuterol (VENTOLIN HFA) 108 (90 Base) MCG/ACT inhaler Inhale 2 puffs into the lungs every 4 (four) hours as needed for wheezing or shortness of breath. 04/28/19   Roxan Hockey, MD  aspirin EC 81 MG tablet Take 1 tablet (81 mg total) by mouth daily with breakfast. 04/28/19 04/27/20  Roxan Hockey,  MD  atorvastatin (LIPITOR) 40 MG tablet Take 1 tablet (40 mg total) by mouth daily. 06/03/19 09/01/19  Lorretta Harp, MD  clopidogrel (PLAVIX) 75 MG tablet Take 1 tablet (75 mg total) by mouth daily. For heart 04/28/19   Roxan Hockey, MD  colchicine 0.6 MG tablet Take 0.6 mg by mouth daily.  05/23/18   [provider]  diazepam (VALIUM) 2 MG tablet Take 2 mg by mouth every 12 (twelve) hours as needed for anxiety.    [provider]  doxazosin (CARDURA) 2 MG tablet Take 2 mg by mouth daily. 05/16/19   [provider]  ferrous sulfate 325 (65 FE) MG tablet Take 1 tablet (325 mg total) by mouth 3 (three) times daily. 04/28/19   Roxan Hockey, MD  furosemide (LASIX) 20 MG tablet Take 1 tablet (20 mg total) by mouth 2 (two) times daily. 04/28/19   Roxan Hockey, MD  guaiFENesin (MUCINEX) 600 MG 12 hr tablet Take 1 tablet (600 mg total) by mouth 2 (two) times daily. 04/28/19   Roxan Hockey, MD  metolazone (ZAROXOLYN) 2.5 MG tablet Take 1 tablet (2.5 mg total) by mouth See admin instructions. Take only on Monday and Friday mornings for fluid and heart 04/28/19   Roxan Hockey, MD  metoprolol tartrate (LOPRESSOR) 25 MG tablet Take 0.5 tablets (12.5 mg total) by mouth 2 (two) times daily. For heart 04/28/19   Roxan Hockey, MD  nitroGLYCERIN (NITROSTAT) 0.4 MG SL tablet PLACE 1 TABLET UNDER THE TONGUE EVERY 5 MINUTES AS NEEDED FOR CHEST PAIN 05/28/19   Lorretta Harp, MD  polyethylene glycol (MIRALAX / GLYCOLAX) 17 g packet Take 17 g by mouth daily.  For bowel 04/28/19   Roxan Hockey, MD  potassium chloride SA (KLOR-CON M20) 20 MEQ tablet Take 1 tablet (20 mEq total) by mouth daily. 04/28/19   Roxan Hockey, MD  potassium chloride (KLOR-CON) 20 MEQ packet TAKE 1 TABLET BY MOUTH EVERY DAY 12/24/12 07/14/13  Lorretta Harp, MD     Critical care time: 60 minutes.

## 2019-08-29 NOTE — ED Triage Notes (Signed)
Pt BIB [facility] for eval s/p cardiac arrest w/ ROSC. Pt was reportedly found down by family, unsure LKW. Fire arrived on scene first, pt found to be in VFib, shocked twice by fire prior to EMS arrival. On EMS arrival, pt remained in vfib. Total of 45 minutes of downtime w/  Fire and EMS. EMS admin 5 Epi, 2 Bicarb, 450 Amio. EMS reports pt was back and forth between PEA and Vfib and eventually achieved ROSC after 45 minutes ACLS interventions. Pt was intubated w/ King airway in the field, per EMS pt was gagging on the [place] and EMS pulled it in the bay. Pt was breathing on his own on arrival, however intubated shortly after for airway protection. Pt maintained ROSC on arrival to [place].

## 2019-08-29 NOTE — ED Notes (Signed)
Urine culture sent to Main Lab with UA

## 2019-08-29 NOTE — Procedures (Signed)
Central Venous Catheter Insertion Procedure Note  Peter Lambert  [id number]  [birth year]  Date:08/29/19  Time:11:03 PM   Provider Performing:Kayon Dozier R Jael Kostick   Procedure: Insertion of Non-tunneled Central Venous [phone]) with US guidance ([id number])   Indication(s) Medication administration  Consent Risks of the procedure as well as the alternatives and risks of each were explained to the patient and/or caregiver.  Consent for the procedure was obtained and is signed in the bedside chart  Anesthesia Topical only with 1% lidocaine   Timeout Verified patient identification, verified procedure, site/side was marked, verified correct patient position, special equipment/implants available, medications/allergies/relevant history reviewed, required imaging and test results available.  Sterile Technique Maximal sterile technique including full sterile barrier drape, hand hygiene, sterile gown, sterile gloves, mask, hair covering, sterile ultrasound probe cover (if used).  Procedure Description Area of catheter insertion was cleaned with chlorhexidine and draped in sterile fashion.  With real-time ultrasound guidance a central venous catheter was placed into the left internal jugular vein. Nonpulsatile blood flow and easy flushing noted in all ports.  The catheter was sutured in place and sterile dressing applied.  Complications/Tolerance None; patient tolerated the procedure well. Chest X-ray is ordered to verify placement for internal jugular or subclavian cannulation.   -  EBL Minimal  Specimen(s) None   Otilio Carpen Corney Knighton, PA-C

## 2019-08-29 NOTE — ED Provider Notes (Signed)
ATTENDING SUPERVISORY NOTE I have personally viewed the imaging studies performed, and I was present for key and critical portions of the procedure as documented. I have personally seen and examined the patient, and discussed the plan of care with the resident.  I have reviewed the documentation of the resident and agree.   No diagnosis found.  .Critical Care Performed by: Elnora Morrison, MD Authorized by: Elnora Morrison, MD   Critical care provider statement:    Critical care time (minutes):  100   Critical care start time:  08/29/2019 5:55 PM   Critical care end time:  08/29/2019 7:35 PM   Critical care time was exclusive of:  Separately billable procedures and treating other patients and teaching time   Critical care was necessary to treat or prevent imminent or life-threatening deterioration of the following conditions:  Cardiac failure   Critical care was time spent personally by me on the following activities:  Discussions with consultants, evaluation of patient's response to treatment, examination of patient, ordering and performing treatments and interventions, ordering and review of laboratory studies, ordering and review of radiographic studies, pulse oximetry, re-evaluation of patient's condition, obtaining history from patient or surrogate and review of old charts  Ultrasound ED Echo  Date/Time: 08/30/2019 1:04 AM Performed by: Elnora Morrison, MD Authorized by: Elnora Morrison, MD   Procedure details:    Indications: cardiac arrest     Views: parasternal long axis view and parasternal short axis view     Images: archived     Limitations:  Positioning Findings:    Pericardium: no pericardial effusion     LV Function: depressed (30 - 50%)     IVC: dilated   Impression:    Impression: decreased contractility        Elnora Morrison, MD 08/30/19 0104

## 2019-08-29 NOTE — Significant Event (Signed)
Cardiology Moon[place] Note  Contacted by [facility] regarding Mr Peter Lambert, who was activated as a STEMI by EMS after being found down in the field. Patient underwent extensive ACLS protocol totaling about 45 min of CPR with multiple shocks for VF. Unknown down time prior to beginning CPR. One 12 lead ECG after ROSC read STEMI. On arrival to [facility], patient was in NSR with frequent PVC's and short runs of NSVT. He was intubated in the ED. ECG on arrival showed RBBB with PVCs but no ST elevation. Case was discussed with on call interventionalist, Dr Gwenlyn Found, and the STEMI was called off. Bedside echo in ED showing moderate biventricular dysfunction consistent with what would be expected after prolonged cardiac arrest. Patient being admitted to CCM. Spoke with admitting team about this case. Please feel free to reach out with any cardiovascular questions regarding this patient's care.   Marcie Mowers, MD Cardiology Fellow, PGY-8

## 2019-08-29 NOTE — Progress Notes (Signed)
Critical ABG values given to Dr. Reather Converse.

## 2019-08-29 NOTE — ED Notes (Signed)
Lactic, Potassium and Troponin results communicated to MD Reather Converse

## 2019-08-29 NOTE — ED Provider Notes (Signed)
[place] Provider Note   CSN: [id number] Arrival date & time: 08/29/19  1733     History Chief Complaint  Patient presents with  . Cardiac Arrest    Livermore is a 74 y.o. male.  The history is provided by the EMS personnel, medical records and a relative. The history is limited by the condition of the patient.    Witness LOC at ~4pm. Shock x2 by fire for Vfib arrest, Shock by EMS x5 with 5 rounds of Epi, Full Ammio, s/p 1L IO, bicarb 100 with Rosc after 5th dose of Epi. Pt intubated with king airway but began gagging and device was removed. Unknown person witnessed LOC per family. Pt has been acting normally wo acute complaints over last few days. Pt normally active still working 3rd shift daily.      Past Medical History:  Diagnosis Date  . Coronary artery disease    a. s/p NSTEMI in 2010 with DES to proximal LAD with D1 jailed and angioplasty alone to ostium  . Hyperlipidemia   . Hypertension   . Hypertension   . MI, old   . Peripheral arterial disease ([place])   . Tobacco abuse   . Type 2 diabetes mellitus [facility])     Patient Active Problem List   Diagnosis Date Noted  . Cardiac arrest ([place])   . Acute on chronic respiratory failure with hypoxia ([place]) 04/23/2019  . Lobar pneumonia ([place]) 04/22/2019  . Multifocal pneumonia   . Demand ischemia of myocardium ([place])   . Atrial flutter ([place])   . Acute on chronic diastolic heart failure ([place])   . Acute respiratory failure with hypoxia ([place]) 04/20/2019  . Acute respiratory failure with hypoxemia ([place]) 07/02/2016  . COPD exacerbation ([place]) 07/02/2016  . Tobacco use 07/17/2013  . Peripheral arterial disease ([place]) 12/03/2012  . Coronary artery disease 12/03/2012  . Essential hypertension 12/03/2012  . Hyperlipidemia 12/03/2012  . New onset type 2 diabetes mellitus ([place]) 12/03/2012    Past Surgical History:  Procedure Laterality Date  . CARDIAC SURGERY    . CORONARY STENT  PLACEMENT         Family History  Problem Relation Age of Onset  . Cancer Mother     Social History   Tobacco Use  . Smoking status: Former Smoker    Packs/day: 0.50    Years: 52.00    Pack years: 26.00    Types: Cigarettes  . Smokeless tobacco: Never Used  Substance Use Topics  . Alcohol use: No  . Drug use: No    Home Medications Prior to Admission medications   Medication Sig Start Date End Date Taking? Authorizing Provider  acetaminophen (TYLENOL) 325 MG tablet Take 2 tablets (650 mg total) by mouth every 6 (six) hours as needed for mild pain, fever or headache (or Fever >/= 101). 04/28/19  Yes Emokpae, Courage, MD  albuterol (VENTOLIN HFA) 108 (90 Base) MCG/ACT inhaler Inhale 2 puffs into the lungs every 4 (four) hours as needed for wheezing or shortness of breath. 04/28/19  Yes Roxan Hockey, MD  aspirin EC 81 MG tablet Take 1 tablet (81 mg total) by mouth daily with breakfast. 04/28/19 04/27/20 Yes Emokpae, Courage, MD  colchicine 0.6 MG tablet Take 0.6 mg by mouth daily.  05/23/18  Yes [provider]  doxazosin (CARDURA) 2 MG tablet Take 2 mg by mouth daily. 05/16/19  Yes [provider]  furosemide (LASIX) 20 MG tablet Take 1 tablet (20  mg total) by mouth 2 (two) times daily. Patient taking differently: Take 60 mg by mouth daily.  04/28/19  Yes Emokpae, Courage, MD  metolazone (ZAROXOLYN) 2.5 MG tablet Take 1 tablet (2.5 mg total) by mouth See admin instructions. Take only on Monday and Friday mornings for fluid and heart Patient taking differently: Take 2.5 mg by mouth daily.  04/28/19  Yes Emokpae, Courage, MD  nitroGLYCERIN (NITROSTAT) 0.4 MG SL tablet PLACE 1 TABLET UNDER THE TONGUE EVERY 5 MINUTES AS NEEDED FOR CHEST PAIN Patient taking differently: Place 0.4 mg under the tongue every 5 (five) minutes as needed for chest pain.  05/28/19  Yes Lorretta Harp, MD  polyethylene glycol (MIRALAX / GLYCOLAX) 17 g packet Take 17 g by mouth daily. For  bowel Patient taking differently: Take 17 g by mouth daily as needed for mild constipation.  04/28/19  Yes Emokpae, Courage, MD  potassium chloride SA (KLOR-CON M20) 20 MEQ tablet Take 1 tablet (20 mEq total) by mouth daily. 04/28/19  Yes Emokpae, Courage, MD  atorvastatin (LIPITOR) 40 MG tablet Take 1 tablet (40 mg total) by mouth daily. Patient not taking: Reported on 08/29/2019 06/03/19 09/01/19  Lorretta Harp, MD  clopidogrel (PLAVIX) 75 MG tablet Take 1 tablet (75 mg total) by mouth daily. For heart Patient not taking: Reported on 08/29/2019 04/28/19   Roxan Hockey, MD  ferrous sulfate 325 (65 FE) MG tablet Take 1 tablet (325 mg total) by mouth 3 (three) times daily. Patient not taking: Reported on 08/29/2019 04/28/19   Roxan Hockey, MD  guaiFENesin (MUCINEX) 600 MG 12 hr tablet Take 1 tablet (600 mg total) by mouth 2 (two) times daily. Patient not taking: Reported on 08/29/2019 04/28/19   Roxan Hockey, MD  metoprolol tartrate (LOPRESSOR) 25 MG tablet Take 0.5 tablets (12.5 mg total) by mouth 2 (two) times daily. For heart Patient not taking: Reported on 08/29/2019 04/28/19   Roxan Hockey, MD  potassium chloride (KLOR-CON) 20 MEQ packet TAKE 1 TABLET BY MOUTH EVERY DAY 12/24/12 07/14/13  Lorretta Harp, MD    Allergies    Patient has no known allergies.  Review of Systems   Review of Systems  Unable to perform ROS: Intubated    Physical Exam Updated Vital Signs BP 99/63   Pulse 77   Temp (!) 95.7 F (35.4 C)   Resp 15   Ht _0  (1.727 m)   Wt 81 kg   SpO2 100%   BMI 27.15 kg/m   Physical Exam Vitals and nursing note reviewed.  Constitutional:      General: He is in acute distress.     Appearance: He is normal weight.  HENT:     Head: Normocephalic and atraumatic.     Nose: Nose normal.  Eyes:     General: No scleral icterus.    Conjunctiva/sclera: Conjunctivae normal.  Cardiovascular:     Rate and Rhythm: Regular rhythm. Tachycardia present.  Pulmonary:      Comments: Bilateral breath sounds present  Abdominal:     General: A surgical scar is present.     Palpations: Abdomen is soft.  Musculoskeletal:     Right lower leg: No edema.     Left lower leg: No edema.  Skin:    General: Skin is warm.     Findings: No erythema, lesion or rash.  Neurological:     Mental Status: He is unresponsive.     GCS: GCS eye subscore is 1. GCS verbal subscore is  1. GCS motor subscore is 1.     Comments: GCS 3.      ED Results / Procedures / Treatments   Labs (all labs ordered are listed, but only abnormal results are displayed) Labs Reviewed  ACETAMINOPHEN LEVEL - Abnormal; Notable for the following components:      Result Value   Acetaminophen (Tylenol), Serum <10 (*)    All other components within normal limits  COMPREHENSIVE METABOLIC PANEL - Abnormal; Notable for the following components:   Potassium 2.6 (*)    Chloride 97 (*)    Glucose, Bld 295 (*)    BUN 25 (*)    Creatinine, Ser 1.69 (*)    Calcium 8.7 (*)    Albumin 3.4 (*)    AST 86 (*)    ALT 64 (*)    GFR calc non Af Amer 39 (*)    GFR calc Af Amer 45 (*)    Anion gap 21 (*)    All other components within normal limits  BRAIN NATRIURETIC PEPTIDE - Abnormal; Notable for the following components:   B Natriuretic Peptide 202.2 (*)    All other components within normal limits  SALICYLATE LEVEL - Abnormal; Notable for the following components:   Salicylate Lvl <8.2 (*)    All other components within normal limits  LACTIC ACID, PLASMA - Abnormal; Notable for the following components:   Lactic Acid, Venous 10.2 (*)    All other components within normal limits  LACTIC ACID, PLASMA - Abnormal; Notable for the following components:   Lactic Acid, Venous 6.6 (*)    All other components within normal limits  CBC WITH DIFFERENTIAL/PLATELET - Abnormal; Notable for the following components:   WBC 24.3 (*)    Hemoglobin 12.0 (*)    Neutro Abs 16.8 (*)    Lymphs Abs 5.8 (*)    Basophils  Absolute 0.2 (*)    All other components within normal limits  URINALYSIS, ROUTINE W REFLEX MICROSCOPIC - Abnormal; Notable for the following components:   APPearance HAZY (*)    Glucose, UA 150 (*)    Hgb urine dipstick MODERATE (*)    Protein, ur 100 (*)    Bacteria, UA RARE (*)    Non Squamous Epithelial 0-5 (*)    All other components within normal limits  COMPREHENSIVE METABOLIC PANEL - Abnormal; Notable for the following components:   Potassium 3.1 (*)    Chloride 97 (*)    Glucose, Bld 380 (*)    BUN 26 (*)    Creatinine, Ser 1.78 (*)    Calcium 8.1 (*)    Total Protein 6.4 (*)    Albumin 3.3 (*)    AST 91 (*)    ALT 59 (*)    Total Bilirubin 1.6 (*)    GFR calc non Af Amer 37 (*)    GFR calc Af Amer 42 (*)    Anion gap 20 (*)    All other components within normal limits  I-STAT ARTERIAL BLOOD GAS, ED - Abnormal; Notable for the following components:   pH, Arterial 7.461 (*)    pO2, Arterial 404 (*)    Bicarbonate 31.9 (*)    TCO2 33 (*)    Acid-Base Excess 7.0 (*)    Potassium 2.4 (*)    Calcium, Ion 1.08 (*)    HCT 38.0 (*)    Hemoglobin 12.9 (*)    All other components within normal limits  TROPONIN I (HIGH SENSITIVITY) - Abnormal; Notable for  the following components:   Troponin I (High Sensitivity) 572 (*)    All other components within normal limits  SARS CORONAVIRUS 2 BY RT PCR (HOSPITAL ORDER, [facility])  CULTURE, BLOOD (ROUTINE X 2)  CULTURE, BLOOD (ROUTINE X 2)  ETHANOL  PROTIME-INR  RAPID URINE DRUG SCREEN, HOSP PERFORMED  PROCALCITONIN  BLOOD GAS, ARTERIAL  MAGNESIUM  TRIGLYCERIDES  PROCALCITONIN  TYPE AND SCREEN  ABO/RH  TROPONIN I (HIGH SENSITIVITY)    EKG EKG Interpretation  Date/Time:  Saturday August 29 2019 17:37:53 EDT Ventricular Rate:  94 PR Interval:    QRS Duration: 169 QT Interval:  377 QTC Calculation: 472 R Axis:   156 Text Interpretation: Sinus rhythm Paired ventricular premature complexes  Borderline short PR interval RBBB and LPFB Borderline ST depression, lateral leads When compared with ECG of 04/23/2019, Right bundle branch block is now present Left posterior fasicular block is now present Premature ventricular complexes are now present Confirmed by Delora Fuel ([id number]) on 08/29/2019 9:20:00 PM   Radiology CT Head Wo Contrast  Result Date: 08/29/2019 CLINICAL DATA:  Post cardiac arrest. EXAM: CT HEAD WITHOUT CONTRAST CT CERVICAL SPINE WITHOUT CONTRAST TECHNIQUE: Multidetector CT imaging of the head and cervical spine was performed following the standard protocol without intravenous contrast. Multiplanar CT image reconstructions of the cervical spine were also generated. COMPARISON:  None FINDINGS: CT HEAD FINDINGS Brain: No evidence of acute infarction, hemorrhage, hydrocephalus, extra-axial collection or mass lesion/mass effect. Subtle focus of added density in the LEFT basal ganglia likely asymmetric calcification is. Mild white matter disease. Vascular: No hyperdense vessel or unexpected calcification. Skull: Normal. Negative for fracture or focal lesion. Sinuses/Orbits: Mild mucosal thickening of ethmoid sinuses. Sinuses are incompletely imaged. Small amount of fluid in the posterior ethmoids likely related to indwelling endotracheal tube. Other: None. CT CERVICAL SPINE FINDINGS Alignment: Normal.  Head is turned to the RIGHT. Skull base and vertebrae: No acute fracture. No primary bone lesion or focal pathologic process. Soft tissues and spinal canal: Endotracheal tube in place. Gastric tube in place, tubes are incompletely imaged. Prevertebral soft tissues with limited assessment due to indwelling tubes. Disc levels: Multilevel degenerative changes greatest at C4-5 C5-6 and C6-7 with small anterior osteophytes. Upper chest: Scarring in the upper chest at the RIGHT lung apex. Extensive areas of parenchymal opacity were seen bilaterally in the chest on previous chest CT. Other: None  IMPRESSION: 1. No acute intracranial abnormality. 2. No acute spine fracture. 3. Multilevel degenerative changes of the cervical spine. 4. Apical scarring in the chest in the RIGHT upper lobe in this patient with diffuse parenchymal disease on previous chest CT. 5. Aortic atherosclerosis. Aortic Atherosclerosis (ICD10-I70.0). Electronically Signed   By: Zetta Bills M.D.   On: 08/29/2019 20:59   CT Cervical Spine Wo Contrast  Result Date: 08/29/2019 CLINICAL DATA:  Post cardiac arrest. EXAM: CT HEAD WITHOUT CONTRAST CT CERVICAL SPINE WITHOUT CONTRAST TECHNIQUE: Multidetector CT imaging of the head and cervical spine was performed following the standard protocol without intravenous contrast. Multiplanar CT image reconstructions of the cervical spine were also generated. COMPARISON:  None FINDINGS: CT HEAD FINDINGS Brain: No evidence of acute infarction, hemorrhage, hydrocephalus, extra-axial collection or mass lesion/mass effect. Subtle focus of added density in the LEFT basal ganglia likely asymmetric calcification is. Mild white matter disease. Vascular: No hyperdense vessel or unexpected calcification. Skull: Normal. Negative for fracture or focal lesion. Sinuses/Orbits: Mild mucosal thickening of ethmoid sinuses. Sinuses are incompletely imaged. Small amount of  fluid in the posterior ethmoids likely related to indwelling endotracheal tube. Other: None. CT CERVICAL SPINE FINDINGS Alignment: Normal.  Head is turned to the RIGHT. Skull base and vertebrae: No acute fracture. No primary bone lesion or focal pathologic process. Soft tissues and spinal canal: Endotracheal tube in place. Gastric tube in place, tubes are incompletely imaged. Prevertebral soft tissues with limited assessment due to indwelling tubes. Disc levels: Multilevel degenerative changes greatest at C4-5 C5-6 and C6-7 with small anterior osteophytes. Upper chest: Scarring in the upper chest at the RIGHT lung apex. Extensive areas of parenchymal  opacity were seen bilaterally in the chest on previous chest CT. Other: None IMPRESSION: 1. No acute intracranial abnormality. 2. No acute spine fracture. 3. Multilevel degenerative changes of the cervical spine. 4. Apical scarring in the chest in the RIGHT upper lobe in this patient with diffuse parenchymal disease on previous chest CT. 5. Aortic atherosclerosis. Aortic Atherosclerosis (ICD10-I70.0). Electronically Signed   By: Zetta Bills M.D.   On: 08/29/2019 20:59   DG Chest Portable 1 View  Result Date: 08/29/2019 CLINICAL DATA:  Central line placement. EXAM: PORTABLE CHEST 1 VIEW COMPARISON:  August 29, 2019 FINDINGS: A left internal jugular venous catheter is seen with its distal tip noted near the junction of the superior vena cava and right atrium. Stable endotracheal tube and nasogastric tube positioning is noted. Mild stable bilateral airspace disease is seen. The heart size and mediastinal contours are within normal limits. The visualized skeletal structures are unremarkable. IMPRESSION: Interval left internal jugular venous catheter placement positioning, as described above, when compared to the prior study. Electronically Signed   By: Virgina Norfolk M.D.   On: 08/29/2019 22:47   DG Chest Portable 1 View  Result Date: 08/29/2019 CLINICAL DATA:  Patient status post intubation and OG tube placement. EXAM: PORTABLE CHEST 1 VIEW COMPARISON:  Single-view of the chest 04/28/2019 and PA and lateral chest 09/09/2010. CT chest 04/20/2019. FINDINGS: Endotracheal tube is in place with the tip 3.3 cm above the carina. OG tube tip is seen in the fundus of the stomach. There is mild cardiomegaly. Atherosclerosis is seen. Patchy airspace disease is seen in the right upper lung zone and throughout the left chest. No pneumothorax or pleural fluid. No acute or focal bony abnormality. IMPRESSION: OG tube and ET tube in good position. Left worse than right patchy airspace disease most worrisome for pneumonia.  Electronically Signed   By: Inge Rise M.D.   On: 08/29/2019 18:30    Procedures Procedure Name: Intubation Date/Time: 08/29/2019 11:25 PM Performed by: Kennyth Lose, MD Pre-anesthesia Checklist: Patient identified, Emergency Drugs available, Suction available and Patient being monitored Oxygen Delivery Method: Ambu bag Preoxygenation: Pre-oxygenation with 100% oxygen Induction Type: Rapid sequence Laryngoscope Size: Mac and 3 Grade View: Grade I Number of attempts: 1 Airway Equipment and Method: Stylet and Video-laryngoscopy Placement Confirmation: ETT inserted through vocal cords under direct vision,  Positive ETCO2,  CO2 detector and Breath sounds checked- equal and bilateral Dental Injury: Teeth and Oropharynx as per pre-operative assessment       (including critical care time)  Medications Ordered in ED Medications  norepinephrine (LEVOPHED) 74m in 2569mpremix infusion (30 mcg/min Intravenous Rate/Dose Change 08/29/19 2213)  docusate (COLACE) 50 MG/5ML liquid 100 mg (has no administration in time range)  polyethylene glycol (MIRALAX / GLYCOLAX) packet 17 g (17 g Oral Not Given 08/29/19 2237)  fentaNYL (SUBLIMAZE) injection 25 mcg (has no administration in time range)  fentaNYL (SUBLIMAZE) injection 25-100  mcg (50 mcg Intravenous Given 08/29/19 2207)  propofol (DIPRIVAN) 1000 MG/100ML infusion (30 mcg/kg/min  81 kg Intravenous Rate/Dose Change 08/29/19 2257)  doxycycline (VIBRAMYCIN) 100 mg in sodium chloride 0.9 % 250 mL IVPB (100 mg Intravenous New Bag/Given 08/29/19 2310)  ceFEPIme (MAXIPIME) 2 g in sodium chloride 0.9 % 100 mL IVPB (has no administration in time range)  potassium chloride 10 mEq in 100 mL IVPB (10 mEq Intravenous New Bag/Given 08/29/19 2312)  docusate sodium (COLACE) capsule 100 mg (has no administration in time range)  polyethylene glycol (MIRALAX / GLYCOLAX) packet 17 g (has no administration in time range)  potassium chloride 10 mEq in 100 mL IVPB (0 mEq  Intravenous Stopped 08/29/19 2243)  vancomycin (VANCOREADY) IVPB 1750 mg/350 mL (0 mg Intravenous Stopped 08/29/19 2311)  piperacillin-tazobactam (ZOSYN) IVPB 3.375 g (0 g Intravenous Stopped 08/29/19 2012)  lactated ringers bolus 1,000 mL (0 mLs Intravenous Stopped 08/29/19 2041)  lactated ringers bolus 500 mL (0 mLs Intravenous Stopped 08/29/19 2228)    ED Course  I have reviewed the triage vital signs and the nursing notes.  Pertinent labs & imaging results that were available during my care of the patient were reviewed by me and considered in my medical decision making (see chart for details).    MDM Rules/Calculators/A&P                          Pt is a 74 yo M w PMHx significant for CAD s/p NSTEMI w DES to LAD, HLD, HTN, PAD, DM2 who presents via EMS after cardiac arrest. Pt initially w rhythm concerning for Vfib and was shocked wo improvement. Patient with 5 rounds of Epi and cardioversion with ROSC PTA. On arrival patient was GCS 3, had bilaterally breath sounds, with stable BP. Pt previously with king airway that was removed when patient began to take breaths on own and gag. Pt not responsive to stimuli on initial exam and patient was intubated. Full procedure note above. During initial evaluation pt became hypotensive with SBP 70s, MAP <65. Pt given 1L LR bolus w only minimal improvement in BP. Levo infusion initiated for hemodynamic support. Cardiology was present at bedside to review EKG and POCUS echo. No evidence of acute ischemia on EKG at this time, no pericardial effusion.   CXR concerning for findings concerning of PNA. CBC significant for leukocytosis 24.3. Lactic acid 6.6. CMP significant for Cr 1.78, BUN 26, AST 91, ALT 59. Hypokalemia 2.6 on CMP, IV KCl repletion initiated. Trop 572. Based on imaging and labs concerning for PNA, Code sepsis was initiated. IV Vanc/Zosyn ordered and full 30cc/kg IVF given. BCx2 drawn. UDS negative. UA wo concern for infection. COVID neg. CT imaging of  head/Cspine was completed wo significant abnormalities.    Patient admitted to ICU level of care in setting of hemodynamic instability following cardiac arrest and possible sepsis from pna.   Final Clinical Impression(s) / ED Diagnoses Final diagnoses:  Cardiac arrest ([place])  Pneumonia of both lungs due to infectious organism, unspecified part of lung  Sepsis, due to unspecified organism, unspecified whether acute organ dysfunction present [facility])    Rx / DC Orders ED Discharge Orders    None       Kennyth Lose, MD 08/29/19 2349    Elnora Morrison, MD 08/30/19 0105

## 2019-08-30 ENCOUNTER — Inpatient Hospital Stay (HOSPITAL_COMMUNITY): Payer: PPO

## 2019-08-30 DIAGNOSIS — I340 Nonrheumatic mitral (valve) insufficiency: Secondary | ICD-10-CM

## 2019-08-30 DIAGNOSIS — I469 Cardiac arrest, cause unspecified: Secondary | ICD-10-CM

## 2019-08-30 LAB — ECHOCARDIOGRAM COMPLETE
Height: 68 in
Weight: 2677.27 oz

## 2019-08-30 LAB — COMPREHENSIVE METABOLIC PANEL
ALT: 50 U/L — ABNORMAL HIGH (ref 0–44)
AST: 66 U/L — ABNORMAL HIGH (ref 15–41)
Albumin: 2.9 g/dL — ABNORMAL LOW (ref 3.5–5.0)
Alkaline Phosphatase: 54 U/L (ref 38–126)
Anion gap: 11 (ref 5–15)
BUN: 28 mg/dL — ABNORMAL HIGH (ref 8–23)
CO2: 23 mmol/L (ref 22–32)
Calcium: 7.6 mg/dL — ABNORMAL LOW (ref 8.9–10.3)
Chloride: 107 mmol/L (ref 98–111)
Creatinine, Ser: 1.87 mg/dL — ABNORMAL HIGH (ref 0.61–1.24)
GFR calc Af Amer: 40 mL/min — ABNORMAL LOW (ref >60)
GFR calc non Af Amer: 34 mL/min — ABNORMAL LOW (ref >60)
Glucose, Bld: 153 mg/dL — ABNORMAL HIGH (ref 70–99)
Potassium: 3.3 mmol/L — ABNORMAL LOW (ref 3.5–5.1)
Sodium: 141 mmol/L (ref 135–145)
Total Bilirubin: 0.8 mg/dL (ref 0.3–1.2)
Total Protein: 5.9 g/dL — ABNORMAL LOW (ref 6.5–8.1)

## 2019-08-30 LAB — GLUCOSE, CAPILLARY
Glucose-Capillary: 103 mg/dL — ABNORMAL HIGH (ref 70–99)
Glucose-Capillary: 129 mg/dL — ABNORMAL HIGH (ref 70–99)
Glucose-Capillary: 136 mg/dL — ABNORMAL HIGH (ref 70–99)
Glucose-Capillary: 156 mg/dL — ABNORMAL HIGH (ref 70–99)
Glucose-Capillary: 251 mg/dL — ABNORMAL HIGH (ref 70–99)
Glucose-Capillary: 290 mg/dL — ABNORMAL HIGH (ref 70–99)

## 2019-08-30 LAB — CBC
HCT: 28.8 % — ABNORMAL LOW (ref 39.0–52.0)
Hemoglobin: 8.8 g/dL — ABNORMAL LOW (ref 13.0–17.0)
MCH: 27.7 pg (ref 26.0–34.0)
MCHC: 30.6 g/dL (ref 30.0–36.0)
MCV: 90.6 fL (ref 80.0–100.0)
Platelets: 162 10*3/uL (ref 150–400)
RBC: 3.18 MIL/uL — ABNORMAL LOW (ref 4.22–5.81)
RDW: 14.8 % (ref 11.5–15.5)
WBC: 12.9 10*3/uL — ABNORMAL HIGH (ref 4.0–10.5)
nRBC: 0 % (ref 0.0–0.2)

## 2019-08-30 LAB — HEMOGLOBIN A1C
Hgb A1c MFr Bld: 6.9 % — ABNORMAL HIGH (ref 4.8–5.6)
Mean Plasma Glucose: 151.33 mg/dL

## 2019-08-30 LAB — MRSA PCR SCREENING: MRSA by PCR: NEGATIVE

## 2019-08-30 LAB — TYPE AND SCREEN
ABO/RH(D): B POS
Antibody Screen: NEGATIVE

## 2019-08-30 LAB — LACTIC ACID, PLASMA
Lactic Acid, Venous: 3.3 mmol/L (ref 0.5–1.9)
Lactic Acid, Venous: 1 mmol/L (ref 0.5–1.9)

## 2019-08-30 LAB — TROPONIN I (HIGH SENSITIVITY): Troponin I (High Sensitivity): 3894 ng/L (ref <18)

## 2019-08-30 LAB — TRIGLYCERIDES: Triglycerides: 157 mg/dL — ABNORMAL HIGH (ref <150)

## 2019-08-30 LAB — HEMOGLOBIN AND HEMATOCRIT, BLOOD
HCT: 33.4 % — ABNORMAL LOW (ref 39.0–52.0)
Hemoglobin: 10.2 g/dL — ABNORMAL LOW (ref 13.0–17.0)

## 2019-08-30 LAB — PROCALCITONIN: Procalcitonin: 3.36 ng/mL

## 2019-08-30 LAB — MAGNESIUM
Magnesium: 1.4 mg/dL — ABNORMAL LOW (ref 1.7–2.4)
Magnesium: 2.5 mg/dL — ABNORMAL HIGH (ref 1.7–2.4)

## 2019-08-30 MED ORDER — PERFLUTREN LIPID MICROSPHERE
1.0000 mL | INTRAVENOUS | Status: AC | PRN
  Administered 2019-08-30: 2 mL via INTRAVENOUS
  Filled 2019-08-30: qty 10

## 2019-08-30 MED ORDER — ACETAMINOPHEN 325 MG PO TABS
650.0000 mg | ORAL_TABLET | ORAL | Status: DC | PRN
  Administered 2019-08-30 – 2019-09-02 (×5): 650 mg via ORAL
  Filled 2019-08-30 (×5): qty 2

## 2019-08-30 MED ORDER — NOREPINEPHRINE 16 MG/250ML-% IV SOLN
0.0000 ug/min | INTRAVENOUS | Status: DC
  Administered 2019-08-30: 20 ug/min via INTRAVENOUS
  Filled 2019-08-30: qty 250

## 2019-08-30 MED ORDER — MAGNESIUM SULFATE 4 GM/100ML IV SOLN
4.0000 g | Freq: Once | INTRAVENOUS | Status: AC
  Administered 2019-08-30: 4 g via INTRAVENOUS
  Filled 2019-08-30: qty 100

## 2019-08-30 MED ORDER — POTASSIUM CHLORIDE 20 MEQ/15ML (10%) PO SOLN
30.0000 meq | Freq: Once | ORAL | Status: AC
  Administered 2019-08-31: 30 meq via ORAL
  Filled 2019-08-30: qty 30

## 2019-08-30 MED ORDER — INSULIN ASPART 100 UNIT/ML ~~LOC~~ SOLN
0.0000 [IU] | SUBCUTANEOUS | Status: DC
  Administered 2019-08-30: 8 [IU] via SUBCUTANEOUS

## 2019-08-30 MED ORDER — ORAL CARE MOUTH RINSE
15.0000 mL | OROMUCOSAL | Status: DC
  Administered 2019-08-30 – 2019-08-31 (×13): 15 mL via OROMUCOSAL

## 2019-08-30 MED ORDER — CHLORHEXIDINE GLUCONATE CLOTH 2 % EX PADS
6.0000 | MEDICATED_PAD | Freq: Every day | CUTANEOUS | Status: DC
  Administered 2019-08-30 – 2019-09-03 (×5): 6 via TOPICAL

## 2019-08-30 MED ORDER — DEXMEDETOMIDINE HCL IN NACL 400 MCG/100ML IV SOLN
0.4000 ug/kg/h | INTRAVENOUS | Status: DC
  Administered 2019-08-30: 0.6 ug/kg/h via INTRAVENOUS
  Administered 2019-08-30: 0.4 ug/kg/h via INTRAVENOUS
  Administered 2019-08-31: 1 ug/kg/h via INTRAVENOUS
  Administered 2019-08-31: 1.2 ug/kg/h via INTRAVENOUS
  Filled 2019-08-30 (×5): qty 100

## 2019-08-30 MED ORDER — HEPARIN SODIUM (PORCINE) 5000 UNIT/ML IJ SOLN
5000.0000 [IU] | Freq: Three times a day (TID) | INTRAMUSCULAR | Status: DC
  Administered 2019-08-30 – 2019-08-31 (×4): 5000 [IU] via SUBCUTANEOUS
  Filled 2019-08-30 (×4): qty 1

## 2019-08-30 MED ORDER — CHLORHEXIDINE GLUCONATE CLOTH 2 % EX PADS: 6.0000 | MEDICATED_PAD | Freq: Every day | CUTANEOUS | Status: DC

## 2019-08-30 MED ORDER — SODIUM CHLORIDE 0.9% FLUSH: 10.0000 mL | INTRAVENOUS | Status: DC | PRN

## 2019-08-30 MED ORDER — INSULIN ASPART 100 UNIT/ML ~~LOC~~ SOLN
3.0000 [IU] | SUBCUTANEOUS | Status: DC
  Administered 2019-08-30: 3 [IU] via SUBCUTANEOUS
  Administered 2019-08-30: 6 [IU] via SUBCUTANEOUS
  Administered 2019-08-30 – 2019-08-31 (×2): 3 [IU] via SUBCUTANEOUS

## 2019-08-30 MED ORDER — SODIUM CHLORIDE 0.9 % IV SOLN
2.0000 g | INTRAVENOUS | Status: AC
  Administered 2019-08-30 – 2019-09-02 (×4): 2 g via INTRAVENOUS
  Filled 2019-08-30 (×4): qty 20
  Filled 2019-08-30: qty 2

## 2019-08-30 MED ORDER — INSULIN ASPART 100 UNIT/ML ~~LOC~~ SOLN: 2.0000 [IU] | SUBCUTANEOUS | Status: DC

## 2019-08-30 MED ORDER — PANTOPRAZOLE SODIUM 40 MG IV SOLR
40.0000 mg | INTRAVENOUS | Status: DC
  Administered 2019-08-30 – 2019-08-31 (×3): 40 mg via INTRAVENOUS
  Filled 2019-08-30 (×3): qty 40

## 2019-08-30 MED ORDER — SODIUM CHLORIDE 0.9% FLUSH
10.0000 mL | Freq: Two times a day (BID) | INTRAVENOUS | Status: DC
  Administered 2019-08-30 (×3): 10 mL
  Administered 2019-08-31: 20 mL
  Administered 2019-09-02 (×2): 10 mL

## 2019-08-30 MED ORDER — CHLORHEXIDINE GLUCONATE 0.12% ORAL RINSE (MEDLINE KIT)
15.0000 mL | Freq: Two times a day (BID) | OROMUCOSAL | Status: DC
  Administered 2019-08-30 – 2019-08-31 (×4): 15 mL via OROMUCOSAL

## 2019-08-30 MED ORDER — POTASSIUM CHLORIDE CRYS ER 20 MEQ PO TBCR
30.0000 meq | EXTENDED_RELEASE_TABLET | Freq: Two times a day (BID) | ORAL | Status: DC
  Administered 2019-08-30: 30 meq via ORAL
  Filled 2019-08-30 (×2): qty 1

## 2019-08-30 NOTE — Progress Notes (Addendum)
NAME:  Peter Lambert, MRN:  [id number], DOB:  06-17-1945, LOS: 1 ADMISSION DATE:  08/29/2019, CONSULTATION DATE:  08/29/19 REFERRING MD: Reather Converse ,ED , CHIEF COMPLAINT:  Cardiac arrest, out of hospital  Brief History    74 year old man with hx of CAD, COPD, here with after out of hospital Vfib cardiac arrest, likely sepsis/CAP, minimally responsive to voice, non purposeful.   History of present illness   Peter Lambert is a 74 year old man with a history of COPD, CAD, s/p DES to LAD in 2010, HLD, HTN, DM2, PAD, here after witnessed cardiac arrest.    Was sitting with a friend, appeared normal, when he suddenly lost consciousness at around 4pm.   She did not do CPR, called EMS.  Unknown duration of downtime.   History comes from his son, Peter Lambert who was not present at the time.  He was told that patient suddenly began having difficulty speaking then became suddenly unconscious.   Reportedly he was shocked x2 for V fib arrest, followed by Epinephrine x 5, amiodarone, bicarbonate, and eventual ROSC.   Per son Peter Lambert, who lives with him, he has been working and acting normally, no complaints at all when he last saw him at about 145 this afternoon.  No recent cough, dyspnea, chest pain, indigestion.   Was gagging on the king airway on arrival, which was removed.  He was reintubated in the ED for AMS (responding to sternal rub but not purposeful), and hypoxia at about 87%.    In ed, started on levophed after sedation.    Peter Lambert was hospitalized with COPD exacerbation in 2/21, at which time he was sent home with O2.  He works at [employer] (overnight shifts, worked [employer]) and also is fully independent, though he is not very physically active.    Cardiology called initially for possible STEMI; they found EKG not c/w STEMI.    Past Medical History  COPD (recent exacerbation 03/2019, sent home on oxygen, had only been using at home for past weeks) CAD, s/p DES to LAD in 2010, HLD, HTN, DM2,  PAD Former tobacco use, quit 03/2019  Significant Hospital Events     Consults:    Procedures:  Intubation 7/3 Central line 7/3  Significant Diagnostic Tests:  CT head neg, CT cervical spine- neg   Micro Data:  Blood cultures 7/3   Antimicrobials:  Zosyn 7/3 Vanc 7/3 Doxy 7/3 -  Cefepime 7/3  Interim history/subjective:  Sedated on vent some flicker of facial movement seen with painful stimuli. Son at bedside and updated.   Objective   Blood pressure 121/67, pulse 75, temperature (!) 100.8 F (38.2 C), resp. rate 15, height 5\' 8"  (1.727 m), weight 75.9 kg, SpO2 100 %.    Vent Mode: PSV;CPAP FiO2 (%):  [40 %-100 %] 40 % Set Rate:  [15 bmp] 15 bmp Vt Set:  [540 mL] 540 mL PEEP:  [5 cmH20] 5 cmH20 Pressure Support:  [15 cmH20] 15 cmH20 Plateau Pressure:  [16 cmH20-20 cmH20] 20 cmH20   Intake/Output Summary (Last 24 hours) at 08/30/2019 0904 Last data filed at 08/30/2019 0700 Gross per 24 hour  Intake 3821.75 ml  Output 1155 ml  Net 2666.75 ml   Filed Weights   08/29/19 1812 08/30/19 0000 08/30/19 0500  Weight: 81 kg 75.9 kg 75.9 kg    Examination:  General: Chronically ill appearing elderly male on mechanical ventilation, in NAD HEENT: ETT, MM pink/moist, PERRL, sclera non-icteric  Neuro:  Sedated on vent, flicker of facial movement seen with painful stimuli  CV: s1s2 regular rate and rhythm, no murmur, rubs, or gallops,  PULM:  Clear to ascultation bilaterally, no added breath sounds, tolerating vent well  GI: soft, bowel sounds active in all 4 quadrants, non-tender, non-distended Extremities: warm/dry, no edema  Skin: no rashes or lesions  Resolved Hospital Problem list     Assessment & Plan:  V fib cardiac arrest: -s/p prolonged out of hospital cardiac arrest.  Unknown downtime.  Cause not entirely clear, as he was apparently asymptomatic prior to arrest, though exact details unclear.   -PNA/CAP and hypokalemia likely contributing.   At risk for anoxic  encephalopathy. P: Cardiology following, appreciate assistance  Minimize sedation  Assess CVP, ECHO UDS negative   Trend troponin and lactic  Goal for normothermia  Obtain spot EEG  Acute hypoxic respiratory failure  -In the setting of V-fib arrest and CAP P: Continue ventilator support with lung protective strategies  Wean PEEP and FiO2 for sats greater than 90%. Head of bed elevated 30 degrees. Plateau pressures less than 30 cm H20.  Follow intermittent chest x-ray and ABG.   Daily SBT, mentation precludes extubation  Ensure adequate pulmonary hygiene  Follow cultures, obtain respiratory culture  VAP bundle in place  PAD protocol  Sepsis/septic shock -Criteria: Temperature 94.6, heart rate 104, WBC 24.3, with acute concern for infection  -Secondary to CAP  P: Remain in ICU Respiratory support as above  De-escalate antibiotics to ceftriaxone  Continue pressor support for MAP< 65  Trend lactic acid Respiratory culture   Hypokalemia -On lasix at home. Unclear cause.   P: Trend Bmet Replete as needed   CKD stage 3 -Baseline creatinine between 1.4-1.6, creatinine on admission 1.69 P: Follow renal function / urine output Trend Bmet Avoid nephrotoxins, ensure adequate renal perfusion   COPD P: Continue BD Respiratory support as above Supportive care   Hx of HTN -Home medications include Aspirin, Cardura, Metalazone, Lipitor, Plavix, and Lopressor  HX of HLD  Hx of CAD  -Des to LAD in 2010. Discussed with on call cardiology.   P: Hold home medications acutely  Cardiology following  Best practice:  Diet: NPO Pain/Anxiety/Delirium protocol (if indicated): propofol, prn fentanyl  VAP protocol (if indicated):  DVT prophylaxis: heparin GI prophylaxis: ppi Glucose control: ssi Mobility: bedrest Code Status: Full. Discussed with son Peter Lambert (goes by middle name Rick), and Peter Lambert.  Would like to cont full code and reassess in 24 h.   Family Communication:  discussed plan with sons Disposition: ICU   Labs   CBC: Recent Labs  Lab 08/29/19 1819 08/29/19 1907  WBC 24.3*  --   NEUTROABS 16.8*  --   HGB 12.0* 12.9*  HCT 39.0 38.0*  MCV 90.9  --   PLT 218  --     Basic Metabolic Panel: Recent Labs  Lab 08/29/19 1819 08/29/19 1907 08/29/19 2231 08/30/19 0428  NA 142 141 139  --   K 2.6* 2.4* 3.1*  --   CL 97*  --  97*  --   CO2 24  --  22  --   GLUCOSE 295*  --  380*  --   BUN 25*  --  26*  --   CREATININE 1.69*  --  1.78*  --   CALCIUM 8.7*  --  8.1*  --   MG  --   --  1.4* 2.5*   GFR: Estimated Creatinine Clearance: 34.7 mL/min (A) (by  C-G formula based on SCr of 1.78 mg/dL (H)). Recent Labs  Lab 08/29/19 1819 08/29/19 2231 08/29/19 2232 08/30/19 0428  PROCALCITON  --  0.63  --  3.36  WBC 24.3*  --   --   --   LATICACIDVEN 10.2*  --  6.6* 3.3*    Liver Function Tests: Recent Labs  Lab 08/29/19 1819 08/29/19 2231  AST 86* 91*  ALT 64* 59*  ALKPHOS 82 71  BILITOT 0.7 1.6*  PROT 6.7 6.4*  ALBUMIN 3.4* 3.3*   No results for input(s): LIPASE, AMYLASE in the last 168 hours. No results for input(s): AMMONIA in the last 168 hours.  ABG    Component Value Date/Time   PHART 7.461 (H) 08/29/2019 1907   PCO2ART 44.0 08/29/2019 1907   PO2ART 404 (H) 08/29/2019 1907   HCO3 31.9 (H) 08/29/2019 1907   TCO2 33 (H) 08/29/2019 1907   ACIDBASEDEF 2.3 (H) 04/20/2019 0902   O2SAT 100.0 08/29/2019 1907     Coagulation Profile: Recent Labs  Lab 08/29/19 1819  INR 1.2    Cardiac Enzymes: No results for input(s): CKTOTAL, CKMB, CKMBINDEX, TROPONINI in the last 168 hours.  HbA1C: Hgb A1c MFr Bld  Date/Time Value Ref Range Status  08/30/2019 04:28 AM 6.9 (H) 4.8 - 5.6 % Final    Comment:    (NOTE) Pre diabetes:          5.7%-6.4%  Diabetes:              >6.4%  Glycemic control for   <7.0% adults with diabetes   04/20/2019 02:55 PM 7.6 (H) 4.8 - 5.6 % Final    Comment:    (NOTE) Pre diabetes:           5.7%-6.4% Diabetes:              >6.4% Glycemic control for   <7.0% adults with diabetes     CBG: Recent Labs  Lab 08/30/19 0031 08/30/19 0330 08/30/19 0800  GLUCAP 290* 251* 156*    Critical care time:    Performed by: Johnsie Cancel  Total critical care time: 45 minutes  Critical care time was exclusive of separately billable procedures and treating other patients.  Critical care was necessary to treat or prevent imminent or life-threatening deterioration.  Critical care was time spent personally by me on the following activities: development of treatment plan with patient and/or surrogate as well as nursing, discussions with consultants, evaluation of patient's response to treatment, examination of patient, obtaining history from patient or surrogate, ordering and performing treatments and interventions, ordering and review of laboratory studies, ordering and review of radiographic studies, pulse oximetry and re-evaluation of patient's condition.  Johnsie Cancel, NP-C [facility] Contact / Pager information can be found on Amion  08/30/2019, 9:32 AM    PCCM:   74 yo COPD, CAD, s/p DES to LAD in 2010, HLD, HTN, DM2, PAD. VF arrest defib X2, unclear on how long he was down.   Now on vent. Pads in place for euthermia.   BP 124/69   Pulse 74   Temp 99.3 F (37.4 C) (Bladder)   Resp 17   Ht 5\' 8"  (1.727 m)   Wt 75.9 kg   SpO2 100%   BMI 25.44 kg/m   Gen: elderly male, intubated on life support  HENT: tracking, awake  Heart: RRR, s1 s2  Lungs: CTAB, BL vented breaths  Abs: soft nt nd   Labs reviewed  Repeats pending   A:  VF cardiac arrest with ROSC Neurologically intact  AHRF on MV  Possible sepsis, not yet determined, possible aspiration during code  hypokalemia   P: Supportive care  EEG pending  Adult vent support  Possible extubation in AM  Ceftriaxone  PAD  VAP ppx   This patient is critically ill with multiple organ  system failure; which, requires frequent high complexity decision making, assessment, support, evaluation, and titration of therapies. This was completed through the application of advanced monitoring technologies and extensive interpretation of multiple databases. During this encounter critical care time was devoted to patient care services described in this note for 32 minutes.  Garner Nash, DO [facility] 08/30/2019 11:17 AM

## 2019-08-30 NOTE — Sepsis Progress Note (Deleted)
Notified provider and RN that there is still a  Need for blood cultures to be collected.

## 2019-08-30 NOTE — Progress Notes (Signed)
[facility] Progress Note Patient Name: Peter Lambert DOB: [birth year] MRN: [id number]   Date of Service  08/30/2019  HPI/Events of Note  Patient admitted to [facility] ICU following out of hospital cardiac arrest with prolonged resuscitation in the field, patient has a history of coronary artery disease but he also presented with lung infiltrates, leukocytosis and lactic acidosis which would fit the profile for septic shock as a contributing etiology.He is comatose on the ventilator.  eICU Interventions  New Patient Evaluation completed, will order follow up lactic acid levels.        [name] 08/30/2019, 2:57 AM

## 2019-08-30 NOTE — Progress Notes (Signed)
  Echocardiogram 2D Echocardiogram has been performed.  Peter Lambert 08/30/2019, 3:21 PM

## 2019-08-30 NOTE — Progress Notes (Signed)
Communicated with ED bedside RN to gather 2 Lactic

## 2019-08-30 NOTE — Progress Notes (Signed)
[facility] Progress Note Patient Name: Peter Lambert DOB: [birth year] MRN: [id number]   Date of Service  08/30/2019  HPI/Events of Note  Blood sugar 251 mg % on the standard SQ insulin hyperglycemic scale  eICU Interventions  Patient switched to the resistant scale for better glycemic control.        Kerry Kass Jeff Mccallum 08/30/2019, 6:03 AM

## 2019-08-31 ENCOUNTER — Encounter (HOSPITAL_COMMUNITY): Payer: Self-pay | Admitting: Pulmonary Disease

## 2019-08-31 DIAGNOSIS — I4901 Ventricular fibrillation: Secondary | ICD-10-CM

## 2019-08-31 LAB — CBC
HCT: 33.4 % — ABNORMAL LOW (ref 39.0–52.0)
Hemoglobin: 10.5 g/dL — ABNORMAL LOW (ref 13.0–17.0)
MCH: 27.9 pg (ref 26.0–34.0)
MCHC: 31.4 g/dL (ref 30.0–36.0)
MCV: 88.8 fL (ref 80.0–100.0)
Platelets: 126 10*3/uL — ABNORMAL LOW (ref 150–400)
RBC: 3.76 MIL/uL — ABNORMAL LOW (ref 4.22–5.81)
RDW: 15.1 % (ref 11.5–15.5)
WBC: 8.9 10*3/uL (ref 4.0–10.5)
nRBC: 0 % (ref 0.0–0.2)

## 2019-08-31 LAB — PHOSPHORUS
Phosphorus: 3.7 mg/dL (ref 2.5–4.6)
Phosphorus: 4 mg/dL (ref 2.5–4.6)

## 2019-08-31 LAB — RESPIRATORY PANEL BY PCR

## 2019-08-31 LAB — COOXEMETRY PANEL
Carboxyhemoglobin: 0.7 % (ref 0.5–1.5)
Methemoglobin: 1.1 % (ref 0.0–1.5)
O2 Saturation: 39.8 %
Total hemoglobin: 15.3 g/dL (ref 12.0–16.0)

## 2019-08-31 LAB — TROPONIN I (HIGH SENSITIVITY)
Troponin I (High Sensitivity): 837 ng/L (ref <18)
Troponin I (High Sensitivity): 809 ng/L (ref <18)

## 2019-08-31 LAB — BASIC METABOLIC PANEL
Anion gap: 9 (ref 5–15)
BUN: 38 mg/dL — ABNORMAL HIGH (ref 8–23)
CO2: 26 mmol/L (ref 22–32)
Calcium: 8.6 mg/dL — ABNORMAL LOW (ref 8.9–10.3)
Chloride: 105 mmol/L (ref 98–111)
Creatinine, Ser: 2.33 mg/dL — ABNORMAL HIGH (ref 0.61–1.24)
GFR calc Af Amer: 31 mL/min — ABNORMAL LOW (ref >60)
GFR calc non Af Amer: 26 mL/min — ABNORMAL LOW (ref >60)
Glucose, Bld: 146 mg/dL — ABNORMAL HIGH (ref 70–99)
Potassium: 4 mmol/L (ref 3.5–5.1)
Sodium: 140 mmol/L (ref 135–145)

## 2019-08-31 LAB — GLUCOSE, CAPILLARY
Glucose-Capillary: 120 mg/dL — ABNORMAL HIGH (ref 70–99)
Glucose-Capillary: 135 mg/dL — ABNORMAL HIGH (ref 70–99)
Glucose-Capillary: 111 mg/dL — ABNORMAL HIGH (ref 70–99)
Glucose-Capillary: 119 mg/dL — ABNORMAL HIGH (ref 70–99)
Glucose-Capillary: 114 mg/dL — ABNORMAL HIGH (ref 70–99)
Glucose-Capillary: 112 mg/dL — ABNORMAL HIGH (ref 70–99)
Glucose-Capillary: 91 mg/dL (ref 70–99)

## 2019-08-31 LAB — HEPARIN LEVEL (UNFRACTIONATED)
Heparin Unfractionated: 1.06 IU/mL — ABNORMAL HIGH (ref 0.30–0.70)
Heparin Unfractionated: 1.09 IU/mL — ABNORMAL HIGH (ref 0.30–0.70)

## 2019-08-31 LAB — MAGNESIUM
Magnesium: 2.3 mg/dL (ref 1.7–2.4)
Magnesium: 2.2 mg/dL (ref 1.7–2.4)
Magnesium: 2.1 mg/dL (ref 1.7–2.4)

## 2019-08-31 LAB — PROCALCITONIN: Procalcitonin: 3.97 ng/mL

## 2019-08-31 MED ORDER — ASPIRIN 81 MG PO CHEW
81.0000 mg | CHEWABLE_TABLET | Freq: Every day | ORAL | Status: DC
  Administered 2019-08-31 – 2019-09-05 (×5): 81 mg
  Filled 2019-08-31 (×5): qty 1

## 2019-08-31 MED ORDER — FUROSEMIDE 10 MG/ML IJ SOLN
80.0000 mg | Freq: Once | INTRAMUSCULAR | Status: AC
  Administered 2019-08-31: 80 mg via INTRAVENOUS
  Filled 2019-08-31: qty 8

## 2019-08-31 MED ORDER — ATORVASTATIN CALCIUM 80 MG PO TABS
80.0000 mg | ORAL_TABLET | Freq: Every day | ORAL | Status: DC
  Administered 2019-08-31 – 2019-09-05 (×5): 80 mg
  Filled 2019-08-31 (×5): qty 1

## 2019-08-31 MED ORDER — ALBUTEROL SULFATE (2.5 MG/3ML) 0.083% IN NEBU: 2.5000 mg | INHALATION_SOLUTION | RESPIRATORY_TRACT | Status: DC | PRN

## 2019-08-31 MED ORDER — VITAL HIGH PROTEIN PO LIQD: 1000.0000 mL | ORAL | Status: DC

## 2019-08-31 MED ORDER — POTASSIUM CHLORIDE 10 MEQ/50ML IV SOLN
10.0000 meq | INTRAVENOUS | Status: AC
  Administered 2019-08-31 (×4): 10 meq via INTRAVENOUS
  Filled 2019-08-31 (×4): qty 50

## 2019-08-31 MED ORDER — POTASSIUM CHLORIDE 20 MEQ/15ML (10%) PO SOLN: 40.0000 meq | Freq: Once | ORAL | Status: DC

## 2019-08-31 MED ORDER — SPIRONOLACTONE 12.5 MG HALF TABLET
12.5000 mg | ORAL_TABLET | Freq: Every day | ORAL | Status: DC
  Filled 2019-08-31: qty 1

## 2019-08-31 MED ORDER — ORAL CARE MOUTH RINSE
15.0000 mL | Freq: Two times a day (BID) | OROMUCOSAL | Status: DC
  Administered 2019-08-31 – 2019-09-04 (×5): 15 mL via OROMUCOSAL

## 2019-08-31 MED ORDER — PRO-STAT SUGAR FREE PO LIQD: 30.0000 mL | Freq: Two times a day (BID) | ORAL | Status: DC

## 2019-08-31 MED ORDER — HEPARIN BOLUS VIA INFUSION
2000.0000 [IU] | Freq: Once | INTRAVENOUS | Status: AC
  Administered 2019-08-31: 2000 [IU] via INTRAVENOUS
  Filled 2019-08-31: qty 2000

## 2019-08-31 MED ORDER — HEPARIN (PORCINE) 25000 UT/250ML-% IV SOLN
950.0000 [IU]/h | INTRAVENOUS | Status: DC
  Administered 2019-08-31: 1000 [IU]/h via INTRAVENOUS
  Administered 2019-09-01: 750 [IU]/h via INTRAVENOUS
  Administered 2019-09-02: 850 [IU]/h via INTRAVENOUS
  Filled 2019-08-31 (×3): qty 250

## 2019-08-31 NOTE — Progress Notes (Signed)
ANTICOAGULATION CONSULT NOTE - Follow-Up Consult  Pharmacy Consult for heparin Indication: chest pain/ACS  No Known Allergies  Patient Measurements: Height: 5\' 8"  (172.7 cm) Weight: 78.5 kg (173 lb 1 oz) IBW/kg (Calculated) : 68.4 Heparin Dosing Weight: 78kg  Vital Signs: Temp: 97.9 F (36.6 C) (07/05 1900) Temp Source: Bladder (07/05 1600) BP: 112/66 (07/05 1800) Pulse Rate: 66 (07/05 1900)  Labs: Recent Labs    08/29/19 1819 08/29/19 1907 08/29/19 2231 08/30/19 1006 08/30/19 1006 08/30/19 1115 08/31/19 0353 08/31/19 0939 08/31/19 1256 08/31/19 1945  HGB 12.0*   < >  --  8.8*   < > 10.2* 10.5*  --   --   --   HCT 39.0   < >  --  28.8*  --  33.4* 33.4*  --   --   --   PLT 218  --   --  162  --   --  126*  --   --   --   LABPROT 14.3  --   --   --   --   --   --   --   --   --   INR 1.2  --   --   --   --   --   --   --   --   --   HEPARINUNFRC  --   --   --   --   --   --   --   --   --  1.06*  CREATININE 1.69*   < > 1.78* 1.87*  --   --  2.33*  --   --   --   TROPONINIHS 572*   < > 3,785* 3,894*  --   --   --  837* 809*  --    < > = values in this interval not displayed.    Estimated Creatinine Clearance: 26.5 mL/min (A) (by C-G formula based on SCr of 2.33 mg/dL (H)).   Medical History: Past Medical History:  Diagnosis Date  . Coronary artery disease    a. s/p NSTEMI in 2010 with DES to proximal LAD with D1 jailed and angioplasty alone to ostium  . Hyperlipidemia   . Hypertension   . Hypertension   . Peripheral arterial disease ([place])   . Tobacco abuse   . Type 2 diabetes mellitus (HCC)     Assessment: 26 yoM admitted s/p VFib arrest initially thought to have STEMI then cancelled. Pharmacy now asked to start heparin gtt with plans for ischemia evaluation.   Heparin level this evening is SUPRAtherapeutic (1.06, confirmed at 1.09). RN drawing from central line and Heparin running in PIV. No bleeding or issues noted per RN - will hold for 1 hour and reduce  the rate.   Goal of Therapy:  Heparin level 0.3-0.7 units/ml Monitor platelets by anticoagulation protocol: Yes   Plan:  - Hold Heparin for 1 hour - Resume Heparin at a lower dose of 750 units/hr - Will continue to monitor for any signs/symptoms of bleeding and will follow up with heparin level in 8 hours   Thank you for allowing pharmacy to be a part of this patient's care.  Alycia Rossetti, PharmD, BCPS Clinical Pharmacist Clinical phone for 08/31/2019: [phone] 08/31/2019 9:31 PM   **Pharmacist phone directory can now be found on [email or web address] ([email or web address]).  Listed under [place].

## 2019-08-31 NOTE — Consult Note (Signed)
Advanced Heart Failure Team Consult Note   Primary Physician: Redmond School, MD PCP-Cardiologist:  Quay Burow, MD   Referring provider: Noe Gens, NP-C (CCM)  Reason for Consultation: VF Arrest/New Onset LV dysfunction  HPI:    Peter Lambert is seen today for evaluation of VF arrest and new onset LV dysfunction at the request of Colman Cater NP-C.    Peter Lambert is a 74 y.o. with DM2, HTN, PAD s/p aorto-bifem graft, tobacco use and CAD s/p NSTEMI in April 2010. Cath showed revealed high-grade proximal LAD stenosis -> PCI 2.75 x 18 mm long Xience V drug-eluting stent. He had a dominant circumflex system with an 80% distal AV groove circumflex as well as a nondominant RCA. He had normal LV function. Abdominal aortogram revealed a patent aortobifemoral bypass graft.   Admitted to the hospital on 04/23/2019 with acute respiratory failure due to PNA/COPD flare. EF 50-55% with RV mildly reduced 04/20/19. He wear 3L of home O2. Works FT at [employer]  On 7/3, was sitting with a friend when he suddenly lost consciousness at around 4pm.  She did not do CPR, called EMS.  Unknown duration of downtime. EMS arrived. Reportedly he was shocked x2 for V fib arrest, followed by Epinephrine x 5, amiodarone, bicarbonate, and eventual ROSC. Was reported that resuscitative efforts took ~45 mins.  ECG on arrival showed RBBB with PVCs but no ST elevation. Case was discussed with on call interventionalist, Dr Gwenlyn Found, and the STEMI was called off. Bedside echo in ED showing moderate biventricular dysfunction.   Underwent normothermia protocol. He is now awake on the vent and following commands. Pending extubation. CVP 8  Hstrop  [phone] -> 837  Echo 08/30/19 EF < 20% RV severely Hk Personally reviewed   Review of Systems: unavailable due to intubation   Past Medical History: Past Medical History:  Diagnosis Date  . Coronary artery disease    a. s/p NSTEMI in 2010 with DES to proximal LAD with D1  jailed and angioplasty alone to ostium  . Hyperlipidemia   . Hypertension   . Hypertension   . Peripheral arterial disease ([place])   . Tobacco abuse   . Type 2 diabetes mellitus ([place])     Past Surgical History: Past Surgical History:  Procedure Laterality Date  . CARDIAC SURGERY    . CORONARY STENT PLACEMENT      Family History: Family History  Problem Relation Age of Onset  . Cancer Mother     Social History: Social History   Socioeconomic History  . Marital status: Divorced    Spouse name: Not on file  . Number of children: Not on file  . Years of education: Not on file  . Highest education level: Not on file  Occupational History  . Not on file  Tobacco Use  . Smoking status: Former Smoker    Packs/day: 0.50    Years: 52.00    Pack years: 26.00    Types: Cigarettes  . Smokeless tobacco: Never Used  Substance and Sexual Activity  . Alcohol use: No  . Drug use: No  . Sexual activity: Never  Other Topics Concern  . Not on file  Social History Narrative  . Not on file   Social Determinants of Health   Financial Resource Strain:   . Difficulty of Paying Living Expenses:   Food Insecurity:   . Worried About Charity fundraiser in the Last Year:   . [place] in the  Last Year:   Transportation Needs:   . Film/video editor (Medical):   Marland Kitchen Lack of Transportation (Non-Medical):   Physical Activity:   . Days of Exercise per Week:   . Minutes of Exercise per Session:   Stress:   . Feeling of Stress :   Social Connections:   . Frequency of Communication with Friends and Family:   . Frequency of Social Gatherings with Friends and Family:   . Attends Religious Services:   . Active Member of Clubs or Organizations:   . Attends Archivist Meetings:   Marland Kitchen Marital Status:    Medications: . aspirin  81 mg Per Tube Daily  . atorvastatin  80 mg Per Tube Daily  . Chlorhexidine Gluconate Cloth  6 each Topical Daily  . docusate  100 mg Oral BID    . insulin aspart  3-9 Units Subcutaneous Q4H  . pantoprazole (PROTONIX) IV  40 mg Intravenous Q24H  . polyethylene glycol  17 g Oral Daily  . sodium chloride flush  10-40 mL Intracatheter Q12H   . cefTRIAXone (ROCEPHIN)  IV Stopped (08/31/19 1058)  . dexmedetomidine (PRECEDEX) IV infusion Stopped (08/31/19 1017)  . heparin 1,000 Units/hr (08/31/19 1239)  . potassium chloride 50 mL/hr at 08/31/19 1239    Allergies:  No Known Allergies  Objective:    Vital Signs:   Temp:  [97 F (36.1 C)-99.3 F (37.4 C)] 97 F (36.1 C) (07/05 1200) Pulse Rate:  [53-66] 58 (07/05 1200) Resp:  [0-19] 19 (07/05 1200) BP: (79-116)/(55-75) 116/68 (07/05 1100) SpO2:  [94 %-100 %] 94 % (07/05 1200) FiO2 (%):  [40 %] 40 % (07/05 0819) Weight:  [78.5 kg] 78.5 kg (07/05 0500) Last BM Date:  (pta)  Weight change: Filed Weights   08/30/19 0000 08/30/19 0500 08/31/19 0500  Weight: 75.9 kg 75.9 kg 78.5 kg    Intake/Output:   Intake/Output Summary (Last 24 hours) at 08/31/2019 1254 Last data filed at 08/31/2019 1239 Gross per 24 hour  Intake 528.84 ml  Output 520 ml  Net 8.84 ml      Physical Exam    General: Intubated. Will arouse and follow commands  HEENT: normal + ETT Neck: supple. JVP 8 . Carotids 2+ bilat; no bruits. No lymphadenopathy or thyromegaly appreciated. Cor: PMI nondisplaced. Regular rate & rhythm. No rubs, gallops or murmurs. Lungs: clear Abdomen: soft, nontender, nondistended. No hepatosplenomegaly. No bruits or masses. Good bowel sounds. Extremities: no cyanosis, clubbing, rash, edema Neuro: awake on vent following commands   Telemetry   Sinus 50-60s Personally reviewed  EKG    NSR 98 RBBB frequent PVCs. Personally reviewed  Labs   Basic Metabolic Panel: Recent Labs  Lab 08/29/19 1819 08/29/19 1819 08/29/19 1907 08/29/19 2231 08/30/19 0428 08/30/19 1006 08/31/19 0353 08/31/19 0939  NA 142  --  141 139  --  141 140  --   K 2.6*  --  2.4* 3.1*  --  3.3*  4.0  --   CL 97*  --   --  97*  --  107 105  --   CO2 24  --   --  22  --  23 26  --   GLUCOSE 295*  --   --  380*  --  153* 146*  --   BUN 25*  --   --  26*  --  28* 38*  --   CREATININE 1.69*  --   --  1.78*  --  1.87* 2.33*  --  CALCIUM 8.7*   < >  --  8.1*  --  7.6* 8.6*  --   MG  --   --   --  1.4* 2.5*  --  2.3 2.2  PHOS  --   --   --   --   --   --   --  3.7   < > = values in this interval not displayed.    Liver Function Tests: Recent Labs  Lab 08/29/19 1819 08/29/19 2231 08/30/19 1006  AST 86* 91* 66*  ALT 64* 59* 50*  ALKPHOS 82 71 54  BILITOT 0.7 1.6* 0.8  PROT 6.7 6.4* 5.9*  ALBUMIN 3.4* 3.3* 2.9*   No results for input(s): LIPASE, AMYLASE in the last 168 hours. No results for input(s): AMMONIA in the last 168 hours.  CBC: Recent Labs  Lab 08/29/19 1819 08/29/19 1907 08/30/19 1006 08/30/19 1115 08/31/19 0353  WBC 24.3*  --  12.9*  --  8.9  NEUTROABS 16.8*  --   --   --   --   HGB 12.0* 12.9* 8.8* 10.2* 10.5*  HCT 39.0 38.0* 28.8* 33.4* 33.4*  MCV 90.9  --  90.6  --  88.8  PLT 218  --  162  --  126*    Cardiac Enzymes: No results for input(s): CKTOTAL, CKMB, CKMBINDEX, TROPONINI in the last 168 hours.  BNP: BNP (last 3 results) Recent Labs    04/20/19 0846 04/23/19 2037 08/29/19 1819  BNP 580.0* 793.0* 202.2*    ProBNP (last 3 results) No results for input(s): PROBNP in the last 8760 hours.   CBG: Recent Labs  Lab 08/30/19 1953 08/31/19 0101 08/31/19 0352 08/31/19 0802 08/31/19 1131  GLUCAP 129* 120* 135* 111* 119*    Coagulation Studies: Recent Labs    08/29/19 1819  LABPROT 14.3  INR 1.2     Imaging   ECHOCARDIOGRAM COMPLETE  Result Date: 08/30/2019    ECHOCARDIOGRAM REPORT   Patient Name:   Peter Lambert Date of Exam: 08/30/2019 Medical Rec #:  [id number]       Height:       68.0 in Accession #:    [id number]      Weight:       167.3 lb Date of Birth:  Nov 14, 1945      BSA:          1.895 m Patient Age:    76 years         BP:           81/76 mmHg Patient Gender: M               HR:           64 bpm. Exam Location:  Inpatient Procedure: 2D Echo, Cardiac Doppler, Color Doppler and Intracardiac            Opacification Agent Indications:    Cardiac arrest  History:        Patient has prior history of Echocardiogram examinations, most                 recent 04/20/2019. CHF, CAD, COPD, Signs/Symptoms:Dyspnea; Risk                 Factors:Hypertension, Current Smoker and Dyslipidemia.  Sonographer:    Roseanna Rainbow RDCS Referring Phys: [id number] Francesca Jewett  Sonographer Comments: Technically difficult study due to poor echo windows and echo performed with patient supine and on artificial respirator. Image acquisition challenging due  to respiratory motion. IMPRESSIONS  1. Left ventricular ejection fraction, by estimation, is 20%. The left ventricle has severely decreased function. The left ventricle demonstrates global hypokinesis, more prominent inferior/posterior walls. There is moderate left ventricular hypertrophy. Left ventricular diastolic parameters are consistent with Grade II diastolic dysfunction (pseudonormalization). Slow flow and stasis noted by Definity contrast in LV particularly at apex. Although no formed thrombus, there is significant substrate for thrombus formation.  2. RV-RA gradient 25 mmHg. Right ventricular systolic function is severely reduced. The right ventricular size is normal.  3. The mitral valve is grossly normal, mildly thickened. Mild mitral valve regurgitation.  4. The aortic valve is tricuspid. Aortic valve regurgitation is not visualized. Mild aortic valve sclerosis is present, with no evidence of aortic valve stenosis.  5. Unable to estimate CVP. FINDINGS  Left Ventricle: Left ventricular ejection fraction, by estimation, is 20%. The left ventricle has severely decreased function. The left ventricle demonstrates global hypokinesis. Definity contrast agent was given IV to delineate the left ventricular  endocardial borders. The left ventricular internal cavity size was normal in size. There is moderate left ventricular hypertrophy. Left ventricular diastolic parameters are consistent with Grade II diastolic dysfunction (pseudonormalization). Right Ventricle: RV-RA gradient 25 mmHg. The right ventricular size is normal. No increase in right ventricular wall thickness. Right ventricular systolic function is severely reduced. Left Atrium: Left atrial size was normal in size. Right Atrium: Right atrial size was normal in size. Pericardium: There is no evidence of pericardial effusion. Mitral Valve: The mitral valve is grossly normal. There is mild thickening of the mitral valve leaflet(s). Mild mitral valve regurgitation. Tricuspid Valve: The tricuspid valve is grossly normal. Tricuspid valve regurgitation is trivial. Aortic Valve: The aortic valve is tricuspid. Aortic valve regurgitation is not visualized. Mild aortic valve sclerosis is present, with no evidence of aortic valve stenosis. Mild aortic valve annular calcification. Pulmonic Valve: The pulmonic valve was grossly normal. Pulmonic valve regurgitation is trivial. Aorta: The aortic root is normal in size and structure. Venous: Unable to estimate CVP. IVC assessment for right atrial pressure unable to be performed due to mechanical ventilation. IAS/Shunts: No atrial level shunt detected by color flow Doppler.  LEFT VENTRICLE PLAX 2D LVIDd:         4.80 cm     Diastology LVIDs:         4.40 cm     LV e' lateral:   2.64 cm/s LV PW:         1.50 cm     LV E/e' lateral: 14.7 LV IVS:        1.40 cm     LV e' medial:    2.87 cm/s LVOT diam:     2.20 cm     LV E/e' medial:  13.6 LV SV:         23 LV SV Index:   12 LVOT Area:     3.80 cm  LV Volumes (MOD) LV vol d, MOD A2C: 93.5 ml LV vol d, MOD A4C: 95.8 ml LV vol s, MOD A2C: 68.4 ml LV vol s, MOD A4C: 71.8 ml LV SV MOD A2C:     25.1 ml LV SV MOD A4C:     95.8 ml LV SV MOD BP:      23.8 ml RIGHT VENTRICLE             IVC RV S prime:     4.73 cm/s  IVC diam: 1.90 cm TAPSE (M-mode): 1.0 cm LEFT ATRIUM  Index       RIGHT ATRIUM           Index LA diam:      3.90 cm 2.06 cm/m  RA Area:     16.70 cm LA Vol (A2C): 27.0 ml 14.25 ml/m RA Volume:   44.20 ml  23.33 ml/m LA Vol (A4C): 25.6 ml 13.51 ml/m  AORTIC VALVE LVOT Vmax:   40.70 cm/s LVOT Vmean:  25.800 cm/s LVOT VTI:    0.060 m  AORTA Ao Root diam: 3.60 cm MITRAL VALVE               TRICUSPID VALVE MV Area (PHT): 2.62 cm    TR Peak grad:   25.2 mmHg MV Decel Time: 289 msec    TR Vmax:        251.00 cm/s MV E velocity: 38.90 cm/s MV A velocity: 42.10 cm/s  SHUNTS MV E/A ratio:  0.92        Systemic VTI:  0.06 m                            Systemic Diam: 2.20 cm Rozann Lesches MD Electronically signed by Rozann Lesches MD Signature Date/Time: 08/30/2019/4:40:40 PM    Final       Medications:     Current Medications: . aspirin  81 mg Per Tube Daily  . atorvastatin  80 mg Per Tube Daily  . Chlorhexidine Gluconate Cloth  6 each Topical Daily  . docusate  100 mg Oral BID  . feeding supplement (PRO-STAT SUGAR FREE 64)  30 mL Per Tube BID  . feeding supplement (VITAL HIGH PROTEIN)  1,000 mL Per Tube Q24H  . insulin aspart  3-9 Units Subcutaneous Q4H  . pantoprazole (PROTONIX) IV  40 mg Intravenous Q24H  . polyethylene glycol  17 g Oral Daily  . sodium chloride flush  10-40 mL Intracatheter Q12H     Infusions: . cefTRIAXone (ROCEPHIN)  IV Stopped (08/31/19 1058)  . dexmedetomidine (PRECEDEX) IV infusion Stopped (08/31/19 1017)  . heparin 1,000 Units/hr (08/31/19 1239)  . potassium chloride 50 mL/hr at 08/31/19 1239       Assessment/Plan   1. VF arrest - suspect ischemic in nature but based on ECG it is possible that he has PVC cardiomyopathy leading to primary VF event - neuro status appears intact despite prolonged resusctiation - will start amio gtt - plan R/L cath when creatinine improves - Keep K > 4.0 Mg > 2.0  2. NSTEMI with known  CAD and LAD stent 4/10 - has h/o CAD - hstrop peaked at 20,894 - start heparin. - continue ASA, statin - can add bblocker as tolerated - cath when renal function permits  3. Acute systolic HF due to severe biventricular dysfunction - Echo 08/30/19 EF < 20% RV severely HKPersonally reviewed - Echo 2/21 EF 50-55% - Unclear if due to ischemia, arrest or PVCs - start amio - CVP 8. Check co-ox  - add spiro - no ARB/ARNI or dig yet with AKI - R/L cath in am  4. Acute hypoxic respiratory failure - due to #1 - weaning vent. CCM managing  5. AKI on CKD 3b - baseline creatinine ~1.5-1.8 - now 2.33 - check co-ox. If low will need inotropic support  6. Bilateral aspiration PNA - PCT 3.97. On ceftriaxone  7. PAD - s/p aortobifem graft  CRITICAL CARE Performed by: Glori Bickers  Total critical care time: 45 minutes  Critical care time was exclusive of separately billable procedures and treating other patients.  Critical care was necessary to treat or prevent imminent or life-threatening deterioration.  Critical care was time spent personally by me (independent of midlevel providers or residents) on the following activities: development of treatment plan with patient and/or surrogate as well as nursing, discussions with consultants, evaluation of patient's response to treatment, examination of patient, obtaining history from patient or surrogate, ordering and performing treatments and interventions, ordering and review of laboratory studies, ordering and review of radiographic studies, pulse oximetry and re-evaluation of patient's condition.    Length of Stay: 2  Glori Bickers, MD  08/31/2019, 12:54 PM  Advanced Heart Failure Team Pager [phone] (M-F; 7a - 4p)  Please contact [facility] for night-coverage after hours (4p -7a ) and weekends on [email or web address]

## 2019-08-31 NOTE — Progress Notes (Signed)
Called to bedside per bedside RN.  Patient self extubated.  Patient currently on RA.  No stridor noted.  Patient tolerating self extubation well.

## 2019-08-31 NOTE — Progress Notes (Signed)
ANTICOAGULATION CONSULT NOTE - Initial Consult  Pharmacy Consult for heparin Indication: chest pain/ACS  No Known Allergies  Patient Measurements: Height: 5\' 8"  (172.7 cm) Weight: 78.5 kg (173 lb 1 oz) IBW/kg (Calculated) : 68.4 Heparin Dosing Weight: 78kg  Vital Signs: Temp: 97.7 F (36.5 C) (07/05 0900) BP: 114/74 (07/05 0900) Pulse Rate: 59 (07/05 0900)  Labs: Recent Labs    08/29/19 1819 08/29/19 1907 08/29/19 2231 08/30/19 1006 08/30/19 1006 08/30/19 1115 08/31/19 0353  HGB 12.0*   < >  --  8.8*   < > 10.2* 10.5*  HCT 39.0   < >  --  28.8*  --  33.4* 33.4*  PLT 218  --   --  162  --   --  126*  LABPROT 14.3  --   --   --   --   --   --   INR 1.2  --   --   --   --   --   --   CREATININE 1.69*   < > 1.78* 1.87*  --   --  2.33*  TROPONINIHS 572*  --  3,785* 3,894*  --   --   --    < > = values in this interval not displayed.    Estimated Creatinine Clearance: 26.5 mL/min (A) (by C-G formula based on SCr of 2.33 mg/dL (H)).   Medical History: Past Medical History:  Diagnosis Date  . Coronary artery disease    a. s/p NSTEMI in 2010 with DES to proximal LAD with D1 jailed and angioplasty alone to ostium  . Hyperlipidemia   . Hypertension   . Hypertension   . Peripheral arterial disease ([place])   . Tobacco abuse   . Type 2 diabetes mellitus ([facility])      Assessment: 71 yoM admitted s/p VFib arrest initially thought to have STEMI then cancelled. Pharmacy now asked to start heparin gtt with plans for ischemia evaluation.   Pt received subQ heparin at 0630. No AC PTA, H/H stable, pltc down slightly this morning. Cath planned for tomorrow 7/6.  Goal of Therapy:  Heparin level 0.3-0.7 units/ml Monitor platelets by anticoagulation protocol: Yes   Plan:  -Heparin 2000 units x1 then 1000 units/h -Check 8hr heparin level   Arrie Senate, PharmD, BCPS Clinical Pharmacist [phone] Please check AMION for all [facility] Pharmacy numbers 08/31/2019

## 2019-08-31 NOTE — Progress Notes (Addendum)
NAME:  Peter Lambert, MRN:  [id number], DOB:  23-Aug-1945, LOS: 2 ADMISSION DATE:  08/29/2019, CONSULTATION DATE:  08/29/19 REFERRING MD: Reather Converse ,ED , CHIEF COMPLAINT:  Cardiac arrest, out of hospital  Brief History   74 year old man with hx of CAD, COPD, admitted 7/3 after out of hospital Vfib cardiac arrest with shock x2, epi x5, amio and bicarb before ROSC, suspected sepsis/CAP.  Unknown downtime, did not receive out of hospital CPR.   Mr. Hocker was hospitalized with COPD exacerbation in 2/21, at which time he was sent home with O2.  He works at [employer] (overnight shifts, worked [employer]) and also is fully independent, though he is not very physically active.     Past Medical History  COPD (recent exacerbation 03/2019, sent home on oxygen, had only been using at home for past weeks) CAD, s/p DES to LAD in 2010, HLD, HTN, DM2, PAD Former tobacco use, quit 03/2019  [facility]   7/03 Admit    Consults:    Procedures:  ETT 7/3 >> 7/5 L IJ TLC 7/3 >>  Significant Diagnostic Tests:  CT head neg, CT cervical spine 7/3 >> neg ECHO 7/4 >> LVEF 20%, LV with severely decreased function with global hypokinesis, moderate LVH, grade II diastolic dysfunction, slow flow and stasis in the LV at the apex EEG 7/5 >>   Micro Data:  BCx2 7/3 >> COVID 7/3 >> negative  Tracheal Aspirate 7/4 >>  RVP 7/5 >>   Antimicrobials:  Zosyn 7/3 x1 Vanc 7/3 x1 Doxy 7/3 x1 Cefepime 7/3 >> 7/4 Ceftriaxone 7/4 >>   Interim history/subjective:  Afebrile / WBC 8.9 On vent - 40% / PEEP 5 Glucose range 111-146  I/O 475ml UOP, +177 RN reports pt following commands, weaning on PSV 15/5  Objective   Blood pressure 113/73, pulse (!) 58, temperature 98.2 F (36.8 C), resp. rate 15, height 5\' 8"  (1.727 m), weight 78.5 kg, SpO2 100 %.    Vent Mode: PRVC FiO2 (%):  [40 %] 40 % Set Rate:  [15 bmp] 15 bmp Vt Set:  [540 mL] 540 mL PEEP:  [5 cmH20] 5 cmH20 Plateau Pressure:  [17 cmH20-19  cmH20] 18 cmH20   Intake/Output Summary (Last 24 hours) at 08/31/2019 0845 Last data filed at 08/31/2019 0000 Gross per 24 hour  Intake 426.64 ml  Output 480 ml  Net -53.36 ml   Filed Weights   08/30/19 0000 08/30/19 0500 08/31/19 0500  Weight: 75.9 kg 75.9 kg 78.5 kg    Examination:  General: elderly male lying in bed on vent in NAD HEENT: MM pink/moist, ETT, pupils reactive  Neuro: on precedex, awakens to voice, follows commands CV: s1s2 rrr, no m/r/g PULM: non-labored on PSV, lungs bilaterally clear GI: soft, bsx4 active  Extremities: warm/dry, no edema  Skin: no rashes or lesions  Resolved Hospital Problem list     Assessment & Plan:   VF Cardiac Arrest At Risk for Anoxic Encephalopathy  Acute Systolic Heart Failure  S/p prolonged out of hospital cardiac arrest.  Unknown downtime.  He was apparently asymptomatic prior to arrest, though exact details unclear.  PNA/CAP and hypokalemia likely contributing.  UDS negative. New reduction of LVEF.  -ICU monitoring -appreciate Cardiology assistance, signed off.  No acute ST elevation on admit. Will ask them to review for CHF.  -follow troponin, lactate -normothermia goal  -await EEG -80 mg IV lasix with KCL -add heparin gtt, asa, lipitor   Acute Hypoxic Respiratory Failure  In the setting of V-fib arrest and CAP -PRVC 8cc/kg, lung protective ventilation  -wean PEEP / FiO2 for sats > 90% -daily SBT / WUA when mental status allows  -VAP prevention measures  -Empiric ceftriaxone for possible CAP  -follow cultures, assess RVP  Acute Metabolic Encephalopathy  Post arrest, at risk anoxic injury  -PAD protocol with precedex + PRN fentanyl  -minimize sedation as able  -daily WUA / SBT   Shock - new LVEF reduction (cause or effect of arrest) vs septic component with CAP  -follow cultures -vasopressors for MAP >65 -assess RVP   AKI on CKD III Hypokalemia On lasix at home.  Baseline creatinine between 1.4-1.6, creatinine  on admission 1.69.  Suspect element of ATN post arrest.  -Trend BMP / urinary output -Replace electrolytes as indicated -Avoid nephrotoxic agents, ensure adequate renal perfusion  Anemia  Thrombocytopenia  -trend CBC  -transfuse for Hgb <7% or active bleeding   COPD Tobacco Abuse -PRN albuterol   DM II  Non-compliant / poorly controlled  -SSI  Hx of HTN, HLD, CAD, PAD  Home medications include Aspirin, Cardura, Metalazone, Lipitor, and Lopressor  Des to LAD in 2010, only on ASA at home -hold home regimen for now  -appreciate Cardiology input  At Risk Malnutrition  -begin TF   Best practice:  Diet: NPO Pain/Anxiety/Delirium protocol (if indicated): precedex, prn fentanyl  VAP protocol (if indicated):  DVT prophylaxis: heparin GI prophylaxis: ppi Glucose control: ssi Mobility: bedrest Code Status: Full Code. Discussed with son Peter Lambert (goes by middle name Rick), and Peter Lambert.    Family Communication: Sons updated at bedside 7/5 Disposition: ICU   Labs   CBC: Recent Labs  Lab 08/29/19 1819 08/29/19 1907 08/30/19 1006 08/30/19 1115 08/31/19 0353  WBC 24.3*  --  12.9*  --  8.9  NEUTROABS 16.8*  --   --   --   --   HGB 12.0* 12.9* 8.8* 10.2* 10.5*  HCT 39.0 38.0* 28.8* 33.4* 33.4*  MCV 90.9  --  90.6  --  88.8  PLT 218  --  162  --  126*    Basic Metabolic Panel: Recent Labs  Lab 08/29/19 1819 08/29/19 1907 08/29/19 2231 08/30/19 0428 08/30/19 1006 08/31/19 0353  NA 142 141 139  --  141 140  K 2.6* 2.4* 3.1*  --  3.3* 4.0  CL 97*  --  97*  --  107 105  CO2 24  --  22  --  23 26  GLUCOSE 295*  --  380*  --  153* 146*  BUN 25*  --  26*  --  28* 38*  CREATININE 1.69*  --  1.78*  --  1.87* 2.33*  CALCIUM 8.7*  --  8.1*  --  7.6* 8.6*  MG  --   --  1.4* 2.5*  --  2.3   GFR: Estimated Creatinine Clearance: 26.5 mL/min (A) (by C-G formula based on SCr of 2.33 mg/dL (H)). Recent Labs  Lab 08/29/19 1819 08/29/19 2231 08/29/19 2232 08/30/19 0428  08/30/19 1005 08/30/19 1006 08/31/19 0353  PROCALCITON  --  0.63  --  3.36  --   --  3.97  WBC 24.3*  --   --   --   --  12.9* 8.9  LATICACIDVEN 10.2*  --  6.6* 3.3* 1.0  --   --     Liver Function Tests: Recent Labs  Lab 08/29/19 1819 08/29/19 2231 08/30/19 1006  AST 86* 91* 66*  ALT 64* 59* 50*  ALKPHOS 82 71 54  BILITOT 0.7 1.6* 0.8  PROT 6.7 6.4* 5.9*  ALBUMIN 3.4* 3.3* 2.9*   No results for input(s): LIPASE, AMYLASE in the last 168 hours. No results for input(s): AMMONIA in the last 168 hours.  ABG    Component Value Date/Time   PHART 7.461 (H) 08/29/2019 1907   PCO2ART 44.0 08/29/2019 1907   PO2ART 404 (H) 08/29/2019 1907   HCO3 31.9 (H) 08/29/2019 1907   TCO2 33 (H) 08/29/2019 1907   ACIDBASEDEF 2.3 (H) 04/20/2019 0902   O2SAT 100.0 08/29/2019 1907     Coagulation Profile: Recent Labs  Lab 08/29/19 1819  INR 1.2    Cardiac Enzymes: No results for input(s): CKTOTAL, CKMB, CKMBINDEX, TROPONINI in the last 168 hours.  HbA1C: Hgb A1c MFr Bld  Date/Time Value Ref Range Status  08/30/2019 04:28 AM 6.9 (H) 4.8 - 5.6 % Final    Comment:    (NOTE) Pre diabetes:          5.7%-6.4%  Diabetes:              >6.4%  Glycemic control for   <7.0% adults with diabetes   04/20/2019 02:55 PM 7.6 (H) 4.8 - 5.6 % Final    Comment:    (NOTE) Pre diabetes:          5.7%-6.4% Diabetes:              >6.4% Glycemic control for   <7.0% adults with diabetes     CBG: Recent Labs  Lab 08/30/19 1527 08/30/19 1953 08/31/19 0101 08/31/19 0352 08/31/19 0802  GLUCAP 103* 129* 120* 135* 111*    Critical care time: 35 minutes    Noe Gens, MSN, NP-C [facility] 08/31/2019, 8:46 AM   Please see [email or web address] for pager details.     Pulmonary critical care attending:  74 year old gentleman with a past medical history of CAD, COPD suffered a V. fib cardiac arrest, defib x2, epi x5.  Was brought into the hospital and has subsequently had a  good neurologic recovery. Echocardiogram did reveal a newly depressed ejection fraction. Patient able to follow some commands on mechanical ventilation this morning.  Tolerating SBT.  May consider liberation from ventilator.  BP 114/74   Pulse (!) 59   Temp 97.7 F (36.5 C)   Resp 13   Ht 5\' 8"  (1.727 m)   Wt 78.5 kg   SpO2 100%   BMI 26.31 kg/m   General: Elderly male intubated on mechanical life support Heart: Regular rhythm S1-S2 Lungs: Bilateral mechanically ventilated breath sounds Abdomen: Soft nontender nondistended Extremities: No significant edema.  Labs: Reviewed  Assessment: VF cardiac arrest Acute systolic heart failure Acute hypoxemic respiratory failure secondary to above Acute metabolic encephalopathy, resolved Shock, resolved AKI on CKD 3 Baseline type 2 diabetes, hypertension, hyperlipidemia, CAD, PAD.  Plan: Consider liberation from mechanical support today. Hold sedation from PAD guidelines See if patient tolerates SAT/SBT. Have cardiology see patient for evaluation of new reduction in systolic function. Continue to follow urine output and serum creatinine closely. May need dose of Lasix to help maintain euvolemia. If with liberate from ventilator may need support with BiPAP temporary.  This patient is critically ill with multiple organ system failure; which, requires frequent high complexity decision making, assessment, support, evaluation, and titration of therapies. This was completed through the application of advanced monitoring technologies and extensive interpretation of multiple databases. During this encounter critical care time  was devoted to patient care services described in this note for 32 minutes.  [facility] 08/31/2019 11:48 AM    1

## 2019-09-01 ENCOUNTER — Inpatient Hospital Stay (HOSPITAL_COMMUNITY): Payer: PPO

## 2019-09-01 ENCOUNTER — Other Ambulatory Visit: Payer: Self-pay

## 2019-09-01 LAB — BASIC METABOLIC PANEL
Anion gap: 10 (ref 5–15)
Anion gap: 12 (ref 5–15)
BUN: 48 mg/dL — ABNORMAL HIGH (ref 8–23)
BUN: 48 mg/dL — ABNORMAL HIGH (ref 8–23)
CO2: 27 mmol/L (ref 22–32)
CO2: 25 mmol/L (ref 22–32)
Calcium: 8.3 mg/dL — ABNORMAL LOW (ref 8.9–10.3)
Calcium: 8.4 mg/dL — ABNORMAL LOW (ref 8.9–10.3)
Chloride: 100 mmol/L (ref 98–111)
Chloride: 102 mmol/L (ref 98–111)
Creatinine, Ser: 2.25 mg/dL — ABNORMAL HIGH (ref 0.61–1.24)
Creatinine, Ser: 2.12 mg/dL — ABNORMAL HIGH (ref 0.61–1.24)
GFR calc Af Amer: 32 mL/min — ABNORMAL LOW (ref >60)
GFR calc Af Amer: 34 mL/min — ABNORMAL LOW (ref >60)
GFR calc non Af Amer: 27 mL/min — ABNORMAL LOW (ref >60)
GFR calc non Af Amer: 30 mL/min — ABNORMAL LOW (ref >60)
Glucose, Bld: 123 mg/dL — ABNORMAL HIGH (ref 70–99)
Glucose, Bld: 122 mg/dL — ABNORMAL HIGH (ref 70–99)
Potassium: 3 mmol/L — ABNORMAL LOW (ref 3.5–5.1)
Potassium: 3.3 mmol/L — ABNORMAL LOW (ref 3.5–5.1)
Sodium: 137 mmol/L (ref 135–145)
Sodium: 139 mmol/L (ref 135–145)

## 2019-09-01 LAB — HEPARIN LEVEL (UNFRACTIONATED)
Heparin Unfractionated: 0.64 IU/mL (ref 0.30–0.70)
Heparin Unfractionated: 0.41 IU/mL (ref 0.30–0.70)

## 2019-09-01 LAB — PHOSPHORUS: Phosphorus: 3.3 mg/dL (ref 2.5–4.6)

## 2019-09-01 LAB — GLUCOSE, CAPILLARY
Glucose-Capillary: 113 mg/dL — ABNORMAL HIGH (ref 70–99)
Glucose-Capillary: 107 mg/dL — ABNORMAL HIGH (ref 70–99)
Glucose-Capillary: 131 mg/dL — ABNORMAL HIGH (ref 70–99)
Glucose-Capillary: 116 mg/dL — ABNORMAL HIGH (ref 70–99)
Glucose-Capillary: 172 mg/dL — ABNORMAL HIGH (ref 70–99)
Glucose-Capillary: 113 mg/dL — ABNORMAL HIGH (ref 70–99)

## 2019-09-01 LAB — COOXEMETRY PANEL
Carboxyhemoglobin: 1.3 % (ref 0.5–1.5)
Methemoglobin: 1.2 % (ref 0.0–1.5)
O2 Saturation: 57.9 %
Total hemoglobin: 10.2 g/dL — ABNORMAL LOW (ref 12.0–16.0)

## 2019-09-01 LAB — CULTURE, RESPIRATORY W GRAM STAIN: Culture: NORMAL

## 2019-09-01 LAB — CBC
HCT: 30.4 % — ABNORMAL LOW (ref 39.0–52.0)
Hemoglobin: 9.6 g/dL — ABNORMAL LOW (ref 13.0–17.0)
MCH: 27.7 pg (ref 26.0–34.0)
MCHC: 31.6 g/dL (ref 30.0–36.0)
MCV: 87.9 fL (ref 80.0–100.0)
Platelets: 126 10*3/uL — ABNORMAL LOW (ref 150–400)
RBC: 3.46 MIL/uL — ABNORMAL LOW (ref 4.22–5.81)
RDW: 15.2 % (ref 11.5–15.5)
WBC: 9 10*3/uL (ref 4.0–10.5)
nRBC: 0 % (ref 0.0–0.2)

## 2019-09-01 LAB — MAGNESIUM
Magnesium: 2.1 mg/dL (ref 1.7–2.4)
Magnesium: 1.8 mg/dL (ref 1.7–2.4)

## 2019-09-01 IMAGING — DX DG CHEST 1V PORT
1 series · 1 of 1 positions shown · non-contrast
Comparison: [DATE].  CT [DATE].

CLINICAL DATA: Acute respiratory failure.  Hypoxia.

EXAM:
PORTABLE CHEST 1 VIEW

[chest ap]
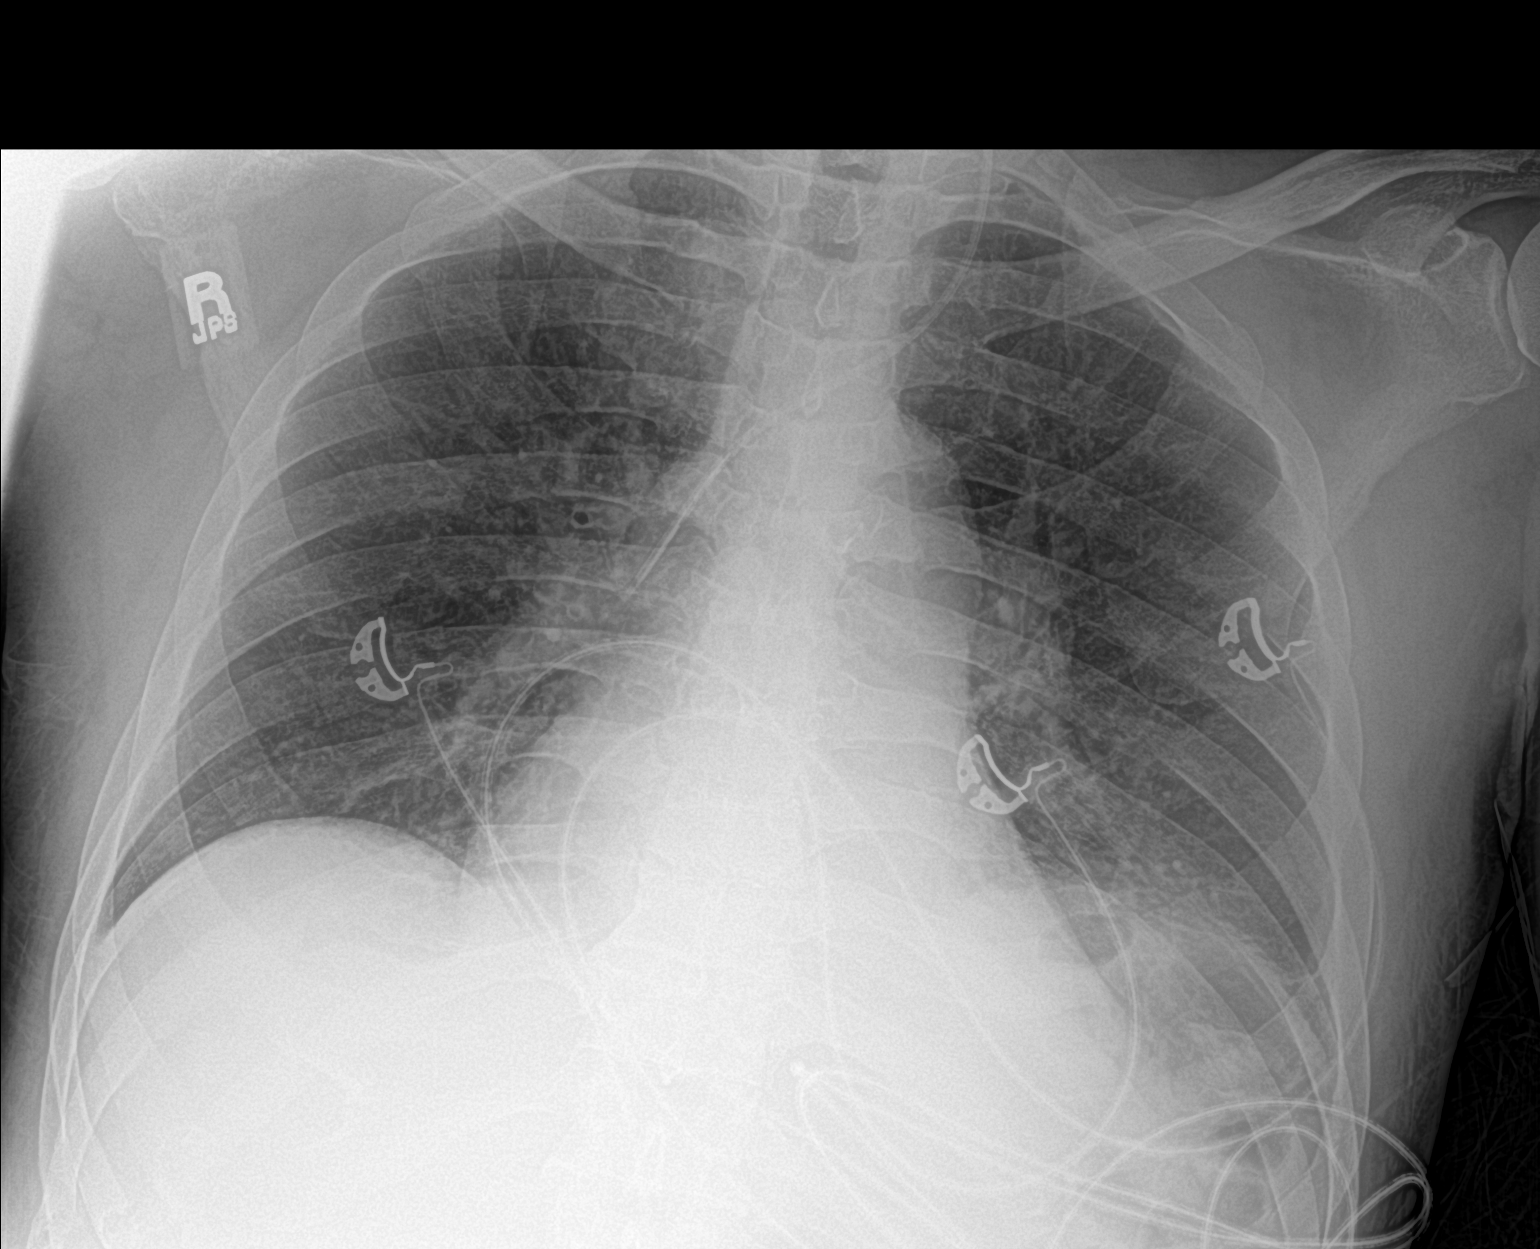

[1 of 1 positions shown; findings below may reference images not displayed]

FINDINGS: Interim extubation and removal of NG tube. Left IJ line stable
position. Heart size stable. Stable bilateral interstitial
prominence. Low lung volumes with bibasilar atelectasis. No
prominent pleural effusion or pneumothorax. No acute bony
abnormality identified.
IMPRESSION: 1. Interim extubation removal of NG tube. Left IJ line stable
position.

2. Stable bilateral interstitial prominence. Low lung volumes with
bibasilar atelectasis. No prominent pleural effusion or
pneumothorax.

## 2019-09-01 MED ORDER — POTASSIUM CHLORIDE 10 MEQ/50ML IV SOLN
10.0000 meq | INTRAVENOUS | Status: AC
  Administered 2019-09-01 (×3): 10 meq via INTRAVENOUS
  Filled 2019-09-01 (×3): qty 50

## 2019-09-01 MED ORDER — SPIRONOLACTONE 25 MG PO TABS
25.0000 mg | ORAL_TABLET | Freq: Every day | ORAL | Status: DC
  Administered 2019-09-01: 25 mg via ORAL
  Filled 2019-09-01: qty 1

## 2019-09-01 MED ORDER — INSULIN ASPART 100 UNIT/ML ~~LOC~~ SOLN
0.0000 [IU] | Freq: Three times a day (TID) | SUBCUTANEOUS | Status: DC
  Administered 2019-09-01: 1 [IU] via SUBCUTANEOUS
  Administered 2019-09-02: 2 [IU] via SUBCUTANEOUS
  Administered 2019-09-04 – 2019-09-05 (×2): 1 [IU] via SUBCUTANEOUS
  Administered 2019-09-05: 2 [IU] via SUBCUTANEOUS

## 2019-09-01 MED ORDER — BUDESONIDE 0.25 MG/2ML IN SUSP
0.2500 mg | Freq: Two times a day (BID) | RESPIRATORY_TRACT | Status: DC
  Administered 2019-09-01: 0.25 mg via RESPIRATORY_TRACT
  Filled 2019-09-01: qty 2

## 2019-09-01 MED ORDER — AMIODARONE HCL 200 MG PO TABS
200.0000 mg | ORAL_TABLET | Freq: Two times a day (BID) | ORAL | Status: DC
  Administered 2019-09-01 – 2019-09-04 (×6): 200 mg via ORAL
  Filled 2019-09-01 (×6): qty 1

## 2019-09-01 MED ORDER — REVEFENACIN 175 MCG/3ML IN SOLN
175.0000 ug | Freq: Every day | RESPIRATORY_TRACT | Status: DC
  Filled 2019-09-01 (×2): qty 3

## 2019-09-01 MED ORDER — FENTANYL CITRATE (PF) 100 MCG/2ML IJ SOLN: 50.0000 ug | Freq: Once | INTRAMUSCULAR | Status: DC

## 2019-09-01 MED ORDER — MAGNESIUM SULFATE 2 GM/50ML IV SOLN
2.0000 g | Freq: Once | INTRAVENOUS | Status: AC
  Administered 2019-09-01: 2 g via INTRAVENOUS
  Filled 2019-09-01: qty 50

## 2019-09-01 MED ORDER — ARFORMOTEROL TARTRATE 15 MCG/2ML IN NEBU
15.0000 ug | INHALATION_SOLUTION | Freq: Two times a day (BID) | RESPIRATORY_TRACT | Status: DC
  Administered 2019-09-01: 15 ug via RESPIRATORY_TRACT
  Filled 2019-09-01: qty 2

## 2019-09-01 MED ORDER — POTASSIUM CHLORIDE CRYS ER 20 MEQ PO TBCR
30.0000 meq | EXTENDED_RELEASE_TABLET | Freq: Four times a day (QID) | ORAL | Status: AC
  Administered 2019-09-01 (×2): 30 meq via ORAL
  Filled 2019-09-01 (×2): qty 1

## 2019-09-01 MED ORDER — PANTOPRAZOLE SODIUM 40 MG PO TBEC
40.0000 mg | DELAYED_RELEASE_TABLET | Freq: Every day | ORAL | Status: DC
  Administered 2019-09-01 – 2019-09-04 (×4): 40 mg via ORAL
  Filled 2019-09-01 (×4): qty 1

## 2019-09-01 NOTE — Progress Notes (Addendum)
Advanced Heart Failure Rounding Note  PCP-Cardiologist: Peter Burow, MD   Subjective:    Admitted with VF arrest and LV dysfunction. Shocked by EMS x2.   Extubated 7/5. Now on room air.   Heart rate was in the 50-60s so amio not started.   Denies SOB. Having some chest soreness.   Objective:   Weight Range: 76.8 kg Body mass index is 25.74 kg/m.   Vital Signs:   Temp:  [96.4 F (35.8 C)-99.9 F (37.7 C)] 99.7 F (37.6 C) (07/06 0700) Pulse Rate:  [52-89] 82 (07/06 0700) Resp:  [13-28] 28 (07/06 0700) BP: (91-131)/(52-115) 131/115 (07/06 0700) SpO2:  [89 %-100 %] 95 % (07/06 0700) FiO2 (%):  [40 %] 40 % (07/05 0819) Weight:  [76.8 kg] 76.8 kg (07/06 0630) Last BM Date:  (pt unable to recall)  Weight change: Filed Weights   08/30/19 0500 08/31/19 0500 09/01/19 0630  Weight: 75.9 kg 78.5 kg 76.8 kg    Intake/Output:   Intake/Output Summary (Last 24 hours) at 09/01/2019 0814 Last data filed at 09/01/2019 0800 Gross per 24 hour  Intake 1155.25 ml  Output 2680 ml  Net -1524.75 ml      Physical Exam   CVPO 10  General:  Well appearing. No resp difficulty HEENT: Normal Neck: Supple. JVP 9-10 . Carotids 2+ bilat; no bruits. No lymphadenopathy or thyromegaly appreciated. LIJ  Cor: PMI nondisplaced. Regular rate & rhythm. No rubs, gallops or murmurs. Lungs: Clear Abdomen: Soft, nontender, nondistended. No hepatosplenomegaly. No bruits or masses. Good bowel sounds. Extremities: No cyanosis, clubbing, rash, edema Neuro: Alert & orientedx3, cranial nerves grossly intact. moves all 4 extremities w/o difficulty. Affect pleasant   Telemetry   SR PACs PVCs 70-80s   EKG    N/a   Labs    CBC Recent Labs    08/29/19 1819 08/29/19 1907 08/31/19 0353 09/01/19 0500  WBC 24.3*   < > 8.9 9.0  NEUTROABS 16.8*  --   --   --   HGB 12.0*   < > 10.5* 9.6*  HCT 39.0   < > 33.4* 30.4*  MCV 90.9   < > 88.8 87.9  PLT 218   < > 126* 126*   < > = values in this  interval not displayed.   Basic Metabolic Panel Recent Labs    08/31/19 0353 08/31/19 1630 09/01/19 0028 09/01/19 0500  NA   < >  --  137 139  K   < >  --  3.0* 3.3*  CL   < >  --  100 102  CO2   < >  --  27 25  GLUCOSE   < >  --  123* 122*  BUN   < >  --  48* 48*  CREATININE   < >  --  2.25* 2.12*  CALCIUM   < >  --  8.3* 8.4*  MG   < > 2.1 2.1 1.8  PHOS  --  4.0  --  3.3   < > = values in this interval not displayed.   Liver Function Tests Recent Labs    08/29/19 2231 08/30/19 1006  AST 91* 66*  ALT 59* 50*  ALKPHOS 71 54  BILITOT 1.6* 0.8  PROT 6.4* 5.9*  ALBUMIN 3.3* 2.9*   No results for input(s): LIPASE, AMYLASE in the last 72 hours. Cardiac Enzymes No results for input(s): CKTOTAL, CKMB, CKMBINDEX, TROPONINI in the last 72 hours.  BNP: BNP (last  3 results) Recent Labs    04/20/19 0846 04/23/19 2037 08/29/19 1819  BNP 580.0* 793.0* 202.2*    ProBNP (last 3 results) No results for input(s): PROBNP in the last 8760 hours.   D-Dimer No results for input(s): DDIMER in the last 72 hours. Hemoglobin A1C Recent Labs    08/30/19 0428  HGBA1C 6.9*   Fasting Lipid Panel Recent Labs    08/30/19 0428  TRIG 157*   Thyroid Function Tests No results for input(s): TSH, T4TOTAL, T3FREE, THYROIDAB in the last 72 hours.  Invalid input(s): FREET3  Other results:   Imaging    DG CHEST PORT 1 VIEW  Result Date: 09/01/2019 CLINICAL DATA:  Acute respiratory failure.  Hypoxia. EXAM: PORTABLE CHEST 1 VIEW COMPARISON:  08/29/2019.  CT 09/17/2019. FINDINGS: Interim extubation and removal of NG tube. Left IJ line stable position. Heart size stable. Stable bilateral interstitial prominence. Low lung volumes with bibasilar atelectasis. No prominent pleural effusion or pneumothorax. No acute bony abnormality identified. IMPRESSION: 1. Interim extubation removal of NG tube. Left IJ line stable position. 2. Stable bilateral interstitial prominence. Low lung volumes  with bibasilar atelectasis. No prominent pleural effusion or pneumothorax. Electronically Signed   By: Peter Lambert  Register   On: 09/01/2019 06:26      Medications:     Scheduled Medications: . aspirin  81 mg Per Tube Daily  . atorvastatin  80 mg Per Tube Daily  . Chlorhexidine Gluconate Cloth  6 each Topical Daily  . docusate  100 mg Oral BID  . fentaNYL (SUBLIMAZE) injection  50 mcg Intravenous Once  . insulin aspart  3-9 Units Subcutaneous Q4H  . mouth rinse  15 mL Mouth Rinse BID  . pantoprazole (PROTONIX) IV  40 mg Intravenous Q24H  . polyethylene glycol  17 g Oral Daily  . sodium chloride flush  10-40 mL Intracatheter Q12H  . spironolactone  12.5 mg Oral Daily     Infusions: . cefTRIAXone (ROCEPHIN)  IV Stopped (08/31/19 1058)  . dexmedetomidine (PRECEDEX) IV infusion Stopped (08/31/19 1017)  . heparin 750 Units/hr (09/01/19 0800)     PRN Medications:  acetaminophen, albuterol, docusate sodium, fentaNYL (SUBLIMAZE) injection, fentaNYL (SUBLIMAZE) injection, polyethylene glycol, sodium chloride flush    Patient Profile  [name] a 74 y.o.with DM2, HTN, PAD s/p aorto-bifem graft, tobacco use and CAD s/p NSTEMI in April 2010. Cath showed revealed high-grade proximal LAD stenosis -> PCI 2.75 x 18 mm long Xience V drug-eluting stent. He had a dominant circumflex system with an 80% distal AV groove circumflex as well as a nondominant RCA. He had normal LV function. Abdominal aortogram revealed a patent aortobifemoral bypass graft.    Assessment/Plan   1. 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 No deficits.  - Amio drip was not started due to low heart rate. Add amio po 200 mg twice a day.  - plan R/L cath when creatinine improves - Keep K > 4.0 Mg > 2.0 - Supp K and give mag today.   2. NSTEMI with known CAD and LAD stent 4/10 - has h/o CAD - hstrop peaked at  3,894 - Continue heparin. - continue ASA, statin - can add bblocker as tolerated - cath when renal function permits  3. Acute systolic HF due to severe biventricular dysfunction - Echo 08/30/19 EF < 20% RV severely HKPersonally reviewed - Echo 2/21 EF 50-55% -  Unclear if due to ischemia, arrest or PVCs - CVP 10.  -CO-OX 58%. Hold off on milrinone.  - Increase spiro 25 mg daily. - no ARB/ARNI or dig yet with AKI - R/L cath in am  4. Acute hypoxic respiratory failure - due to #1 - Extubated 7/5. Sats stable on room air.   5. AKI on CKD 3b - baseline creatinine ~1.5-1.8 - Creatinine 2.3>2.1    6. Bilateral aspiration PNA - PCT 3.97. On ceftriaxone  7. PAD - s/p aortobifem graft  8. Anemia  Hgb trending down. 10.5>9.6.  No obvious source.  Check anemia panel.   Add po amio 200 mg twice a day - Increase spiro. Supp K and give Mag.  Plan cath once creatinine down closer to 1.5   Length of Stay: 3  Amy Clegg, NP  09/01/2019, 8:14 AM  Advanced Heart Failure Team Pager [phone] (M-F; 7a - 4p)  Please contact [facility] for night-coverage after hours (4p -7a ) and weekends on [email or web address]   Agree with above.   Now extubated. Chest sore from CPR. Initial co-ox 40%. This am 58% having frequent PVCs no recurrent VT/VF. Renal function improving.  General:  Sitting up in bed No resp difficulty HEENT: normal Neck: supple. JVP 6 Carotids 2+ bilat; no bruits. No lymphadenopathy or thryomegaly appreciated. Cor: PMI nondisplaced. Regular rate & rhythm.+s3 Lungs: clear Abdomen: soft, nontender, nondistended. No hepatosplenomegaly. No bruits or masses. Good bowel sounds. Extremities: no cyanosis, clubbing, rash, edema Neuro: alert & orientedx3, cranial nerves grossly intact. moves all 4 extremities w/o difficulty. Affect pleasant  Initial co-ox with low output but now improved. Still with extensive PVC burden. 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   Plan: - titrate spiro - start amio - cath  when Cr <= 1.8 - hold b-blocker for now with low co-ox - will likely need ICD or LifeVest on d/c  CRITICAL CARE Performed by: Glori Bickers  Total critical care time: 35 minutes  Critical care time was exclusive of separately billable procedures and treating other patients.  Critical care was necessary to treat or prevent imminent or life-threatening deterioration.  Critical care was time spent personally by me (independent of midlevel providers or residents) on the following activities: development of treatment plan with patient and/or surrogate as well as nursing, discussions with consultants, evaluation of patient's response to treatment, examination of patient, obtaining history from patient or surrogate, ordering and performing treatments and interventions, ordering and review of laboratory studies, ordering and review of radiographic studies, pulse oximetry and re-evaluation of patient's condition.  Glori Bickers, MD  11:15 AM

## 2019-09-01 NOTE — Progress Notes (Addendum)
NAME:  Peter Lambert, MRN:  [id number], DOB:  [birth year], LOS: 3 ADMISSION DATE:  08/29/2019, CONSULTATION DATE:  08/29/19 REFERRING MD: Reather Converse ,ED , CHIEF COMPLAINT:  Cardiac arrest, out of hospital  Brief History   74 year old man with hx of CAD, COPD, admitted 7/3 after out of hospital Vfib cardiac arrest with shock x2, epi x5, amio and bicarb before ROSC, suspected sepsis/CAP.  Unknown downtime, did not receive out of hospital CPR.   Mr. Pense was hospitalized with COPD exacerbation in 2/21, at which time he was sent home with O2.  He works at [employer] (overnight shifts, worked [employer]) and also is fully independent, though he is not very physically active.     Past Medical History  COPD (recent exacerbation 03/2019, sent home on oxygen, had only been using at home for past weeks) CAD, s/p DES to LAD in 2010, HLD, HTN, DM2, PAD Former tobacco use, quit 03/2019  [place] [facility] Events   7/03 Admit post arrest  7/05 Extubated    Consults:    Procedures:  ETT 7/3 >> 7/5 L IJ TLC 7/3 >>  Significant Diagnostic Tests:  CT head neg, CT cervical spine 7/3 >> neg ECHO 7/4 >> LVEF 20%, LV with severely decreased function with global hypokinesis, moderate LVH, grade II diastolic dysfunction, slow flow and stasis in the LV at the apex  Micro Data:  BCx2 7/3 >> COVID 7/3 >> negative  Tracheal Aspirate 7/4 >>  RVP 7/5 >> negative   Antimicrobials:  Zosyn 7/3 x1 Vanc 7/3 x1 Doxy 7/3 x1 Cefepime 7/3 >> 7/4 Ceftriaxone 7/4 >>   Interim history/subjective:  Tmax 99.7 / WBC 9 (down from 24.3 > 8.9)  Glucose range 107-122 I/O 2.6L UOP, -1.7L in last 24 hours (with diuresis) RN reports residual intermittent confusion, low K (replaced)   Objective   Blood pressure (!) 131/115, pulse 82, temperature 99.7 F (37.6 C), resp. rate (!) 28, height 5\' 8"  (1.727 m), weight 76.8 kg, SpO2 95 %. CVP:  [5 mmHg-10 mmHg] 10 mmHg      Intake/Output Summary (Last 24 hours) at 09/01/2019  1003 Last data filed at 09/01/2019 0800 Gross per 24 hour  Intake 1155.25 ml  Output 2740 ml  Net -1584.75 ml   Filed Weights   08/30/19 0500 08/31/19 0500 09/01/19 0630  Weight: 75.9 kg 78.5 kg 76.8 kg    Examination:  General: elderly male sitting up in bed in NAD   HEENT: MM pink/moist, [place] O2 Neuro: AAOx4, speech clear, pleasant affect / grateful, MAE CV: s1s2 irr irr, frequent PVC's, no m/r/g PULM: non-labored on  O2, lungs bilaterally with rhonchi, moist cough GI: soft, bsx4 active  Extremities: warm/dry, no edema  Skin: no rashes or lesions  Resolved Hospital Problem list   Shock - new LVEF reduction (cause or effect of arrest) vs septic component with CAP   Assessment & Plan:   VF Cardiac Arrest At Risk for Anoxic Encephalopathy  Acute Systolic Heart Failure  S/p prolonged out of hospital cardiac arrest.  Unknown downtime.  He was apparently asymptomatic prior to arrest, though exact details unclear.  PNA/CAP and hypokalemia likely contributing.  UDS negative. New reduction of LVEF (prior normal).  -transfer out of ICU to cardiac progressive  -pending [facility] for Thursday 7/8 per Dr. Haroldine Laws -appreciate Cardiology assistance  -continue heparin, ASA, lipitor -continue spironolactone  Acute Hypoxic Respiratory Failure  In the setting of V-fib arrest and CAP. RVP negative.  -wean O2 for  sats >90% -continue rocephin for possible CAP, complete 5 days total. Stop date in place -follow cultures   Acute Metabolic Encephalopathy  Post arrest, at risk anoxic injury  -improving, delirium prevention measures  -minimize sedating medications  -PT efforts   AKI on CKD III Hypokalemia On lasix at home.  Baseline creatinine between 1.4-1.6, creatinine on admission 1.69.  Suspect element of ATN post arrest.  -Trend BMP / urinary output -Replace electrolytes as indicated, goal K+ >4, Mg+ >2 -Avoid nephrotoxic agents, ensure adequate renal perfusion -discontinue foley,  strict I/O's  Anemia  Thrombocytopenia  -trend CBC  -transfuse for Hgb <7% or active bleeding   COPD Tobacco Abuse -PRN albuterol   DM II  Non-compliant / poorly controlled  -SSI   Hx of HTN, HLD, CAD, PAD s/p Aorto-Bi fem Home medications include Aspirin, Cardura, Metalazone, Lipitor, and Lopressor  Des to LAD in 2010, only on ASA at home -amiodarone, ASA, Lipitor -defer beta blocker to Cardiology  -appreciate Cardiology assistance with patient's care   Best practice:  Diet: Heart healthy diet  Pain/Anxiety/Delirium protocol (if indicated):n/a VAP protocol (if indicated):  DVT prophylaxis: heparin GI prophylaxis: ppi Glucose control: ssi Mobility: bedrest Code Status: Full Code. Discussed with son [name] (goes by middle name Rick), and [name].    Family Communication: Patient updated at bedside 7/6 Disposition: ICU   Labs   CBC: Recent Labs  Lab 08/29/19 1819 08/29/19 1819 08/29/19 1907 08/30/19 1006 08/30/19 1115 08/31/19 0353 09/01/19 0500  WBC 24.3*  --   --  12.9*  --  8.9 9.0  NEUTROABS 16.8*  --   --   --   --   --   --   HGB 12.0*   < > 12.9* 8.8* 10.2* 10.5* 9.6*  HCT 39.0   < > 38.0* 28.8* 33.4* 33.4* 30.4*  MCV 90.9  --   --  90.6  --  88.8 87.9  PLT 218  --   --  162  --  126* 126*   < > = values in this interval not displayed.    Basic Metabolic Panel: Recent Labs  Lab 08/29/19 2231 08/30/19 0428 08/30/19 1006 08/31/19 0353 08/31/19 0939 08/31/19 1630 09/01/19 0028 09/01/19 0500  NA 139  --  141 140  --   --  137 139  K 3.1*  --  3.3* 4.0  --   --  3.0* 3.3*  CL 97*  --  107 105  --   --  100 102  CO2 22  --  23 26  --   --  27 25  GLUCOSE 380*  --  153* 146*  --   --  123* 122*  BUN 26*  --  28* 38*  --   --  48* 48*  CREATININE 1.78*  --  1.87* 2.33*  --   --  2.25* 2.12*  CALCIUM 8.1*  --  7.6* 8.6*  --   --  8.3* 8.4*  MG 1.4*   < >  --  2.3 2.2 2.1 2.1 1.8  PHOS  --   --   --   --  3.7 4.0  --  3.3   < > = values in this  interval not displayed.   GFR: Estimated Creatinine Clearance: 29.1 mL/min (A) (by C-G formula based on SCr of 2.12 mg/dL (H)). Recent Labs  Lab 08/29/19 1819 08/29/19 2231 08/29/19 2232 08/30/19 0428 08/30/19 1005 08/30/19 1006 08/31/19 0353 09/01/19 0500  PROCALCITON  --  0.63  --  3.36  --   --  3.97  --   WBC 24.3*  --   --   --   --  12.9* 8.9 9.0  LATICACIDVEN 10.2*  --  6.6* 3.3* 1.0  --   --   --     Liver Function Tests: Recent Labs  Lab 08/29/19 1819 08/29/19 2231 08/30/19 1006  AST 86* 91* 66*  ALT 64* 59* 50*  ALKPHOS 82 71 54  BILITOT 0.7 1.6* 0.8  PROT 6.7 6.4* 5.9*  ALBUMIN 3.4* 3.3* 2.9*   No results for input(s): LIPASE, AMYLASE in the last 168 hours. No results for input(s): AMMONIA in the last 168 hours.  ABG    Component Value Date/Time   PHART 7.461 (H) 08/29/2019 1907   PCO2ART 44.0 08/29/2019 1907   PO2ART 404 (H) 08/29/2019 1907   HCO3 31.9 (H) 08/29/2019 1907   TCO2 33 (H) 08/29/2019 1907   ACIDBASEDEF 2.3 (H) 04/20/2019 0902   O2SAT 57.9 09/01/2019 0510     Coagulation Profile: Recent Labs  Lab 08/29/19 1819  INR 1.2    Cardiac Enzymes: No results for input(s): CKTOTAL, CKMB, CKMBINDEX, TROPONINI in the last 168 hours.  HbA1C: Hgb A1c MFr Bld  Date/Time Value Ref Range Status  08/30/2019 04:28 AM 6.9 (H) 4.8 - 5.6 % Final    Comment:    (NOTE) Pre diabetes:          5.7%-6.4%  Diabetes:              >6.4%  Glycemic control for   <7.0% adults with diabetes   04/20/2019 02:55 PM 7.6 (H) 4.8 - 5.6 % Final    Comment:    (NOTE) Pre diabetes:          5.7%-6.4% Diabetes:              >6.4% Glycemic control for   <7.0% adults with diabetes     CBG: Recent Labs  Lab 08/31/19 1608 08/31/19 2025 08/31/19 2319 09/01/19 0429 09/01/19 0701  GLUCAP 114* 112* 91 113* 107*    Critical care time: n/a    Noe Gens, MSN, NP-C [facility] 09/01/2019, 10:03 AM   Please see [email or web address] for  pager details.     PCCM:  74 yo, CAD, COPD, Vfib arrest, defb X2, epiX5. He was temp regulated for a short period but had good neurologic recovery.   BP (!) 131/115   Pulse 82   Temp 99.7 F (37.6 C)   Resp (!) 28   Ht 5\' 8"  (1.727 m)   Wt 76.8 kg   SpO2 95%   BMI 25.74 kg/m   Gen: elderly male, up in chair, awake alert Heart: RRR, s1 s2 Lungs: clear, no wheeze  Ext: no edema   Labs: reviewed   A:  VF Cardiac arrest  Acute systolic heart failure (new)  AHRF, resolved, weaning from O2  Acute metabolic encephalopathy  AKI on CKD  COPD baseline   P: Scheduled nebs  ICS/LABA/LAMA, prn SABA  HF w/u by cards planned, LHC planned  Stable for transfer from ICU  Abx X5 days and stop date included  Has CVC for now but can likely remove once peripheral obtained or s/p cath   Garner Nash, DO [facility] 09/01/2019 2:40 PM

## 2019-09-01 NOTE — Progress Notes (Addendum)
ANTICOAGULATION CONSULT NOTE - Follow-Up Consult  Pharmacy Consult for heparin Indication: chest pain/ACS  No Known Allergies  Patient Measurements: Height: 5\' 8"  (172.7 cm) Weight: 76.8 kg (169 lb 5 oz) IBW/kg (Calculated) : 68.4 Heparin Dosing Weight: 78kg  Vital Signs: Temp: 99.7 F (37.6 C) (07/06 0700) Temp Source: Bladder (07/06 0737) BP: 131/115 (07/06 0700) Pulse Rate: 82 (07/06 0700)  Labs: Recent Labs    08/29/19 1819 08/29/19 1907 08/30/19 1006 08/30/19 1006 08/30/19 1115 08/30/19 1115 08/31/19 0353 08/31/19 0939 08/31/19 1256 08/31/19 1945 08/31/19 2149 09/01/19 0028 09/01/19 0500 09/01/19 0639  HGB 12.0*   < > 8.8*   < > 10.2*   < > 10.5*  --   --   --   --   --  9.6*  --   HCT 39.0   < > 28.8*   < > 33.4*  --  33.4*  --   --   --   --   --  30.4*  --   PLT 218   < > 162  --   --   --  126*  --   --   --   --   --  126*  --   LABPROT 14.3  --   --   --   --   --   --   --   --   --   --   --   --   --   INR 1.2  --   --   --   --   --   --   --   --   --   --   --   --   --   HEPARINUNFRC  --   --   --   --   --   --   --   --   --  1.06* 1.09*  --   --  0.64  CREATININE 1.69*   < > 1.87*  --   --    < > 2.33*  --   --   --   --  2.25* 2.12*  --   TROPONINIHS 572*   < > 3,894*  --   --   --   --  837* 809*  --   --   --   --   --    < > = values in this interval not displayed.    Estimated Creatinine Clearance: 29.1 mL/min (A) (by C-G formula based on SCr of 2.12 mg/dL (H)).   Medical History: Past Medical History:  Diagnosis Date  . Coronary artery disease    a. s/p NSTEMI in 2010 with DES to proximal LAD with D1 jailed and angioplasty alone to ostium  . Hyperlipidemia   . Hypertension   . Hypertension   . Peripheral arterial disease ([place])   . Tobacco abuse   . Type 2 diabetes mellitus (HCC)     Assessment: 55 yoM admitted s/p VFib arrest initially thought to have STEMI then cancelled. Pharmacy now asked to start heparin gtt with plans  for ischemia evaluation.   Heparin level this evening is therapeutic on higher end of goal range (0.64), on 750 units/hr. RN drawing from central line and Heparin running in PIV. No bleeding or issues noted per RN. Plan for cath once Scr improves.   Goal of Therapy:  Heparin level 0.3-0.7 units/ml Monitor platelets by anticoagulation protocol: Yes   Plan:  -Continue heparin infusion at 750 units/hr  -  Will continue to monitor for any signs/symptoms of bleeding  -Will follow up with heparin level in 8 hours   Thank you for allowing pharmacy to be a part of this patient's care.  Antonietta Jewel, PharmD, [place] Clinical Pharmacist  Phone: [phone] 09/01/2019 8:28 AM  Please check AMION for all [place] phone numbers After 10:00 PM, call [place] [phone]   ADDENDUM Heparin level came back therapeutic at 0.41, on 750 units/hr. No s/sx of bleeding or infusion issues. Plan to monitor daily HL at this time and continue on heparin 750 units/hr.   Antonietta Jewel, PharmD, [place] Clinical Pharmacist

## 2019-09-01 NOTE — Evaluation (Signed)
Occupational Therapy Evaluation Patient Details Name: Peter Lambert MRN: [id number] DOB: 20-Sep-1945 Today's Date: 09/01/2019    History of Present Illness Pt is 74 year old man with hx of CAD, COPD, admitted 7/3 after out of hospital Vfib cardiac arrest with shock x2, epi x5, amio and bicarb before ROSC, suspected sepsis/CAP.  Unknown downtime, did not receive out of hospital CPR.  Patient extubated 7/5.   Clinical Impression   This 74 y/o male presents with the above. PTA pt reports being independent with ADL, iADL and functional mobility was still working. Pt presents seated in recliner very pleasant and willing to participate in therapy session. Pt overall requiring minA for functional transfers using RW, requiring up to [place] for LB ADL as pt having difficulty reaching his feet given notable chest soreness from receiving CPR. He reports he lives with son who can assist intermittently PRN. Pt to benefit from continued acute OT services and currently recommend follow up [facility] services after discharge to progress his overall safety and independence with ADL and mobility.   BP start of session 141/67 (88) End of session 146/66 (86) SpO2 >90% on RA, HR 84    Follow Up Recommendations  Home health OT;Supervision - Intermittent    Equipment Recommendations  Tub/shower seat           Precautions / Restrictions Precautions Precautions: Fall Restrictions Weight Bearing Restrictions: No      Mobility Bed Mobility               General bed mobility comments: OOB in recliner   Transfers Overall transfer level: Needs assistance Equipment used: Rolling walker (2 wheeled) Transfers: Sit to/from Stand Sit to Stand: Min assist         General transfer comment: light steadying assist to rise to RW, VCs for hand placement, peformed x2 from recliner     Balance Overall balance assessment: Needs assistance   Sitting balance-Leahy Scale: Good     Standing balance support:  Bilateral upper extremity supported Standing balance-Leahy Scale: Poor Standing balance comment: UE support in standing today                           ADL either performed or assessed with clinical judgement   ADL Overall ADL's : Needs assistance/impaired Eating/Feeding: Set up;Sitting   Grooming: Set up;Supervision/safety;Sitting   Upper Body Bathing: Supervision/ safety;Sitting   Lower Body Bathing: Minimal assistance;Sitting/lateral leans;Sit to/from stand   Upper Body Dressing : Sitting;Minimal assistance Upper Body Dressing Details (indicate cue type and reason): only due to UE/chest soreness Lower Body Dressing: Moderate assistance;Sit to/from stand Lower Body Dressing Details (indicate cue type and reason): minA standing balance, pt able to doff sock but cannot don at this time due to UE/chest soreness  Toilet Transfer: Minimal assistance;Stand-pivot;RW Toilet Transfer Details (indicate cue type and reason): simulated via transfer to/from recliner Toileting- Clothing Manipulation and Hygiene: Sit to/from stand;Sitting/lateral lean;Moderate assistance       Functional mobility during ADLs: Minimal assistance;Rolling walker       Vision         Perception     Praxis      Pertinent Vitals/Pain Pain Assessment: Faces Faces Pain Scale: Hurts even more Pain Location: chest Pain Descriptors / Indicators: Sore;Discomfort;Grimacing Pain Intervention(s): Limited activity within patient's tolerance;Monitored during session;Repositioned     Hand Dominance Right   Extremity/Trunk Assessment Upper Extremity Assessment Upper Extremity Assessment: RUE deficits/detail;LUE deficits/detail RUE Deficits / Details:  limited shoulder ROM due to being sore from CPR RUE Coordination: decreased gross motor LUE Deficits / Details: limited shoulder ROM due to being sore from CPR LUE Coordination: decreased gross motor   Lower Extremity Assessment Lower Extremity  Assessment: Defer to PT evaluation   Cervical / Trunk Assessment Cervical / Trunk Assessment: Kyphotic   Communication Communication Communication: No difficulties   Cognition Arousal/Alertness: Awake/alert Behavior During Therapy: WFL for tasks assessed/performed Overall Cognitive Status: Within Functional Limits for tasks assessed                                 General Comments: very pleasant    General Comments       Exercises     Shoulder Instructions      Home Living Family/patient expects to be discharged to:: Private residence Living Arrangements: Children Available Help at Discharge: Family;Available PRN/intermittently Type of Home: House Home Access: Stairs to enter [employer] of Steps: 1 (threshold) Entrance Stairs-Rails: None Home Layout: One level     Bathroom Shower/Tub: Teacher, early years/pre: Handicapped height     Home Equipment: Cane - single point          Prior Functioning/Environment Level of Independence: Independent        Comments: mows with riding mower, drives, son is a Administrator, but home nightly        OT Problem List: Decreased strength;Decreased range of motion;Decreased activity tolerance;Impaired balance (sitting and/or standing);Cardiopulmonary status limiting activity;Decreased knowledge of use of DME or AE;Pain;Impaired UE functional use      OT Treatment/Interventions: Self-care/ADL training;Therapeutic exercise;Energy conservation;DME and/or AE instruction;Therapeutic activities;Cognitive remediation/compensation;Balance training;Patient/family education    OT Goals(Current goals can be found in the care plan section) Acute Rehab OT Goals Patient Stated Goal: To go home OT Goal Formulation: With patient Time For Goal Achievement: 09/15/19 Potential to Achieve Goals: Good  OT Frequency: Min 2X/week   Barriers to D/C:            Co-evaluation              AM-PAC OT  "6 Clicks" Daily Activity     Outcome Measure Help from another person eating meals?: None Help from another person taking care of personal grooming?: A Little Help from another person toileting, which includes using toliet, bedpan, or urinal?: A Little Help from another person bathing (including washing, rinsing, drying)?: A Lot Help from another person to put on and taking off regular upper body clothing?: A Little Help from another person to put on and taking off regular lower body clothing?: A Lot 6 Click Score: 17   End of Session Equipment Utilized During Treatment: Gait belt;Rolling walker Nurse Communication: Mobility status  Activity Tolerance: Patient tolerated treatment well Patient left: in chair;with call bell/phone within reach  OT Visit Diagnosis: Muscle weakness (generalized) (M62.81);Unsteadiness on feet (R26.81);Pain Pain - part of body:  (chest/ bil shoulder)                Time: 7034-0352 OT Time Calculation (min): 23 min Charges:  OT General Charges $OT Visit: 1 Visit OT Evaluation $OT Eval Moderate Complexity: 1 Mod OT Treatments $Self Care/Home Management : 8-22 mins  Lou Cal, OT Acute Rehabilitation Services Pager [phone] Office [phone]   Raymondo Band 09/01/2019, 4:45 PM

## 2019-09-01 NOTE — Progress Notes (Signed)
[facility] Progress Note Patient Name: Peter Lambert DOB: [birth year] MRN: [id number]   Date of Service  09/01/2019  HPI/Events of Note  Patient c/o pain - No relief with Tylenol.   eICU Interventions  Plan: 1. Fentanyl 50 mcg IV X 1 now.      Intervention Category Major Interventions: Other:  Shereka Lafortune Cornelia Copa 09/01/2019, 4:54 AM

## 2019-09-01 NOTE — Evaluation (Signed)
Physical Therapy Evaluation Patient Details Name: Peter Lambert MRN: [id number] DOB: Dec 31, 1945 Today's Date: 09/01/2019   History of Present Illness  Brief History 74 year old man with hx of CAD, COPD, admitted 7/3 after out of hospital Vfib cardiac arrest with shock x2, epi x5, amio and bicarb before ROSC, suspected sepsis/CAP.  Unknown downtime, did not receive out of hospital CPR.  Patient extubated 7/5.  Clinical Impression  Patient presents with decreased mobility due to general weakness, pain in chest, decreased activity tolerance and balance.  Currently mod to min A for mobility and able to ambulate in hallway with EVA walker and min A 120'.  Patient lives with one son who works driving trucks.  Feels he can have support from his ex-wife and other sons so feel should be able to go home with family support and follow up HHPT.  Will continue to follow acutely.     Follow Up Recommendations Home health PT;Supervision/Assistance - 24 hour    Equipment Recommendations  Rolling walker with 5" wheels    Recommendations for Other Services       Precautions / Restrictions Precautions Precautions: Fall      Mobility  Bed Mobility Overal bed mobility: Needs Assistance Bed Mobility: Rolling;Sidelying to Sit Rolling: Min assist Sidelying to sit: Mod assist;HOB elevated       General bed mobility comments: cues for technique to limit strain on chest, assist with pad to turn hips, lifting assist for trunk, cues throughout  Transfers Overall transfer level: Needs assistance Equipment used: 4-wheeled walker (EVA walker) Transfers: Sit to/from Stand Sit to Stand: Min assist         General transfer comment: cue to push from bed, assist for balance and anterior weight shift  Ambulation/Gait Ambulation/Gait assistance: Min assist Gait Distance (Feet): 120 Feet Assistive device: 4-wheeled walker (EVA walker) Gait Pattern/deviations: Step-through pattern;Decreased stride  length;Drifts right/left     General Gait Details: use of eva walker assist for forward path and around obstacles due to walker easily veers  Stairs            Wheelchair Mobility    Modified Rankin (Stroke Patients Only)       Balance Overall balance assessment: Needs assistance   Sitting balance-Leahy Scale: Good     Standing balance support: Bilateral upper extremity supported Standing balance-Leahy Scale: Poor Standing balance comment: UE support in standing today                             Pertinent Vitals/Pain Pain Assessment: Faces Faces Pain Scale: Hurts even more Pain Location: chest Pain Descriptors / Indicators: Sore Pain Intervention(s): Monitored during session;Repositioned    Home Living Family/patient expects to be discharged to:: Private residence Living Arrangements: Children Available Help at Discharge: Family;Available PRN/intermittently Type of Home: House Home Access: Stairs to enter Entrance Stairs-Rails: None Entrance Stairs-Number of Steps: 1 (threshold) Home Layout: One level Home Equipment: Cane - single point      Prior Function Level of Independence: Independent         Comments: mows with riding mower, drives, son is a Administrator, but home nightly     Hand Dominance   Dominant Hand: Right    Extremity/Trunk Assessment   Upper Extremity Assessment Upper Extremity Assessment: RUE deficits/detail;LUE deficits/detail RUE Deficits / Details: AROM WFL to shoulder height (due to pain in chest from CPR), strength at least 4/5 throughout LUE Deficits / Details: AROM  WFL to shoulder height (due to pain in chest from CPR), strength at least 4/5 throughout    Lower Extremity Assessment Lower Extremity Assessment: Overall WFL for tasks assessed    Cervical / Trunk Assessment Cervical / Trunk Assessment: Kyphotic  Communication   Communication: No difficulties  Cognition Arousal/Alertness:  Awake/alert Behavior During Therapy: WFL for tasks assessed/performed Overall Cognitive Status: Within Functional Limits for tasks assessed                                        General Comments General comments (skin integrity, edema, etc.): son, Peter Lambert in room during session, but left, also has two other sons and ex-wife, reports could have assist during the day.  SpO2 dropped to 85% with ambulation, but back to 93% at rest on RA; other vitals WNL with some bigeminy on monitor per MD    Exercises     Assessment/Plan    PT Assessment Patient needs continued PT services  PT Problem List Decreased strength;Decreased mobility;Decreased balance;Decreased knowledge of use of DME;Decreased activity tolerance;Decreased safety awareness       PT Treatment Interventions DME instruction;Therapeutic activities;Therapeutic exercise;Patient/family education;Gait training;Balance training;Functional mobility training    PT Goals (Current goals can be found in the Care Plan section)  Acute Rehab PT Goals Patient Stated Goal: To go home PT Goal Formulation: With patient Time For Goal Achievement: 09/15/19 Potential to Achieve Goals: Good    Frequency Min 3X/week   Barriers to discharge        Co-evaluation               AM-PAC PT "6 Clicks" Mobility  Outcome Measure Help needed turning from your back to your side while in a flat bed without using bedrails?: A Little Help needed moving from lying on your back to sitting on the side of a flat bed without using bedrails?: A Lot Help needed moving to and from a bed to a chair (including a wheelchair)?: A Little Help needed standing up from a chair using your arms (e.g., wheelchair or bedside chair)?: A Little Help needed to walk in hospital room?: A Little Help needed climbing 3-5 steps with a railing? : A Lot 6 Click Score: 16    End of Session   Activity Tolerance: Patient tolerated treatment well Patient left: in  chair;with call bell/phone within reach Nurse Communication: Mobility status PT Visit Diagnosis: Other abnormalities of gait and mobility (R26.89);Muscle weakness (generalized) (M62.81)    Time: 1005-1040 PT Time Calculation (min) (ACUTE ONLY): 35 min   Charges:   PT Evaluation $PT Eval Moderate Complexity: 1 Mod PT Treatments $Gait Training: 8-22 mins        Magda Kiel, PT Acute Rehabilitation Services BDZHG:[phone] Office:[phone] 09/01/2019   Reginia Naas 09/01/2019, 1:11 PM

## 2019-09-01 NOTE — Progress Notes (Signed)
SLP Cancellation Note  Patient Details Name: GIANLUCCA SZYMBORSKI MRN: [id number] DOB: 05-19-1945   Cancelled treatment:       Reason Eval/Treat Not Completed: SLP screened, no needs identified, will sign off. Per RN diet advanced, pt tolerating. No SLP eval needed, will defer.    Aidyn Kellis, Katherene Ponto 09/01/2019, 10:54 AM

## 2019-09-01 NOTE — Progress Notes (Signed)
[facility] Progress Note Patient Name: Peter Lambert DOB: 31-Jul-1945 MRN: [id number]   Date of Service  09/01/2019  HPI/Events of Note  Hypokalemia - K+ = 3.0 and Creatinine = 2.25.   eICU Interventions  Will replace K+.      Intervention Category Major Interventions: Electrolyte abnormality - evaluation and management  Derrall Hicks Eugene 09/01/2019, 1:30 AM

## 2019-09-02 DIAGNOSIS — I214 Non-ST elevation (NSTEMI) myocardial infarction: Secondary | ICD-10-CM | POA: Diagnosis present

## 2019-09-02 DIAGNOSIS — R739 Hyperglycemia, unspecified: Secondary | ICD-10-CM | POA: Diagnosis present

## 2019-09-02 DIAGNOSIS — E872 Acidosis, unspecified: Secondary | ICD-10-CM | POA: Diagnosis present

## 2019-09-02 DIAGNOSIS — Z9119 Patient's noncompliance with other medical treatment and regimen: Secondary | ICD-10-CM

## 2019-09-02 DIAGNOSIS — Z91199 Patient's noncompliance with other medical treatment and regimen due to unspecified reason: Secondary | ICD-10-CM

## 2019-09-02 DIAGNOSIS — E876 Hypokalemia: Secondary | ICD-10-CM | POA: Diagnosis present

## 2019-09-02 DIAGNOSIS — R809 Proteinuria, unspecified: Secondary | ICD-10-CM | POA: Diagnosis present

## 2019-09-02 DIAGNOSIS — I493 Ventricular premature depolarization: Secondary | ICD-10-CM | POA: Diagnosis present

## 2019-09-02 DIAGNOSIS — N183 Chronic kidney disease, stage 3 unspecified: Secondary | ICD-10-CM | POA: Diagnosis present

## 2019-09-02 DIAGNOSIS — I5021 Acute systolic (congestive) heart failure: Secondary | ICD-10-CM | POA: Diagnosis present

## 2019-09-02 DIAGNOSIS — R6521 Severe sepsis with septic shock: Secondary | ICD-10-CM | POA: Diagnosis present

## 2019-09-02 DIAGNOSIS — G9341 Metabolic encephalopathy: Secondary | ICD-10-CM | POA: Diagnosis present

## 2019-09-02 DIAGNOSIS — A419 Sepsis, unspecified organism: Secondary | ICD-10-CM | POA: Diagnosis present

## 2019-09-02 DIAGNOSIS — D696 Thrombocytopenia, unspecified: Secondary | ICD-10-CM | POA: Diagnosis present

## 2019-09-02 DIAGNOSIS — I472 Ventricular tachycardia: Secondary | ICD-10-CM

## 2019-09-02 DIAGNOSIS — I4729 Other ventricular tachycardia: Secondary | ICD-10-CM

## 2019-09-02 DIAGNOSIS — R7989 Other specified abnormal findings of blood chemistry: Secondary | ICD-10-CM | POA: Diagnosis present

## 2019-09-02 DIAGNOSIS — Z7901 Long term (current) use of anticoagulants: Secondary | ICD-10-CM

## 2019-09-02 DIAGNOSIS — E8809 Other disorders of plasma-protein metabolism, not elsewhere classified: Secondary | ICD-10-CM | POA: Diagnosis present

## 2019-09-02 DIAGNOSIS — D72829 Elevated white blood cell count, unspecified: Secondary | ICD-10-CM | POA: Diagnosis present

## 2019-09-02 DIAGNOSIS — D649 Anemia, unspecified: Secondary | ICD-10-CM | POA: Diagnosis present

## 2019-09-02 DIAGNOSIS — N179 Acute kidney failure, unspecified: Secondary | ICD-10-CM | POA: Diagnosis present

## 2019-09-02 LAB — BASIC METABOLIC PANEL
Anion gap: 10 (ref 5–15)
Anion gap: 10 (ref 5–15)
BUN: 35 mg/dL — ABNORMAL HIGH (ref 8–23)
BUN: 27 mg/dL — ABNORMAL HIGH (ref 8–23)
CO2: 27 mmol/L (ref 22–32)
CO2: 27 mmol/L (ref 22–32)
Calcium: 8.3 mg/dL — ABNORMAL LOW (ref 8.9–10.3)
Calcium: 8.5 mg/dL — ABNORMAL LOW (ref 8.9–10.3)
Chloride: 101 mmol/L (ref 98–111)
Chloride: 102 mmol/L (ref 98–111)
Creatinine, Ser: 1.71 mg/dL — ABNORMAL HIGH (ref 0.61–1.24)
Creatinine, Ser: 1.47 mg/dL — ABNORMAL HIGH (ref 0.61–1.24)
GFR calc Af Amer: 44 mL/min — ABNORMAL LOW (ref >60)
GFR calc Af Amer: 53 mL/min — ABNORMAL LOW (ref >60)
GFR calc non Af Amer: 38 mL/min — ABNORMAL LOW (ref >60)
GFR calc non Af Amer: 46 mL/min — ABNORMAL LOW (ref >60)
Glucose, Bld: 118 mg/dL — ABNORMAL HIGH (ref 70–99)
Glucose, Bld: 145 mg/dL — ABNORMAL HIGH (ref 70–99)
Potassium: 3 mmol/L — ABNORMAL LOW (ref 3.5–5.1)
Potassium: 4.2 mmol/L (ref 3.5–5.1)
Sodium: 138 mmol/L (ref 135–145)
Sodium: 139 mmol/L (ref 135–145)

## 2019-09-02 LAB — IRON AND TIBC
Iron: 19 ug/dL — ABNORMAL LOW (ref 45–182)
Saturation Ratios: 7 % — ABNORMAL LOW (ref 17.9–39.5)
TIBC: 288 ug/dL (ref 250–450)
UIBC: 269 ug/dL

## 2019-09-02 LAB — GLUCOSE, CAPILLARY
Glucose-Capillary: 173 mg/dL — ABNORMAL HIGH (ref 70–99)
Glucose-Capillary: 131 mg/dL — ABNORMAL HIGH (ref 70–99)
Glucose-Capillary: 120 mg/dL — ABNORMAL HIGH (ref 70–99)
Glucose-Capillary: 115 mg/dL — ABNORMAL HIGH (ref 70–99)
Glucose-Capillary: 157 mg/dL — ABNORMAL HIGH (ref 70–99)

## 2019-09-02 LAB — RETICULOCYTES
Immature Retic Fract: 26.8 % — ABNORMAL HIGH (ref 2.3–15.9)
RBC.: 3.55 MIL/uL — ABNORMAL LOW (ref 4.22–5.81)
Retic Count, Absolute: 53.3 10*3/uL (ref 19.0–186.0)
Retic Ct Pct: 1.5 % (ref 0.4–3.1)

## 2019-09-02 LAB — COOXEMETRY PANEL
Carboxyhemoglobin: 1.6 % — ABNORMAL HIGH (ref 0.5–1.5)
Methemoglobin: 1.4 % (ref 0.0–1.5)
O2 Saturation: 72.8 %
Total hemoglobin: 9.8 g/dL — ABNORMAL LOW (ref 12.0–16.0)

## 2019-09-02 LAB — CBC
HCT: 29.2 % — ABNORMAL LOW (ref 39.0–52.0)
Hemoglobin: 9.4 g/dL — ABNORMAL LOW (ref 13.0–17.0)
MCH: 28.3 pg (ref 26.0–34.0)
MCHC: 32.2 g/dL (ref 30.0–36.0)
MCV: 88 fL (ref 80.0–100.0)
Platelets: 135 10*3/uL — ABNORMAL LOW (ref 150–400)
RBC: 3.32 MIL/uL — ABNORMAL LOW (ref 4.22–5.81)
RDW: 15 % (ref 11.5–15.5)
WBC: 9.3 10*3/uL (ref 4.0–10.5)
nRBC: 0 % (ref 0.0–0.2)

## 2019-09-02 LAB — HEPARIN LEVEL (UNFRACTIONATED): Heparin Unfractionated: 0.24 IU/mL — ABNORMAL LOW (ref 0.30–0.70)

## 2019-09-02 LAB — VITAMIN B12: Vitamin B-12: 147 pg/mL — ABNORMAL LOW (ref 180–914)

## 2019-09-02 LAB — MAGNESIUM: Magnesium: 2.1 mg/dL (ref 1.7–2.4)

## 2019-09-02 LAB — FOLATE: Folate: 5.2 ng/mL — ABNORMAL LOW (ref >5.9)

## 2019-09-02 LAB — FERRITIN: Ferritin: 235 ng/mL (ref 24–336)

## 2019-09-02 MED ORDER — SODIUM CHLORIDE 0.9 % IV SOLN: INTRAVENOUS | Status: DC

## 2019-09-02 MED ORDER — ACETAMINOPHEN 325 MG PO TABS
650.0000 mg | ORAL_TABLET | ORAL | Status: DC
  Administered 2019-09-02 – 2019-09-05 (×12): 650 mg via ORAL
  Filled 2019-09-02 (×12): qty 2

## 2019-09-02 MED ORDER — SODIUM CHLORIDE 0.9 % IV SOLN
510.0000 mg | Freq: Once | INTRAVENOUS | Status: AC
  Administered 2019-09-02: 510 mg via INTRAVENOUS
  Filled 2019-09-02: qty 17

## 2019-09-02 MED ORDER — POLYETHYLENE GLYCOL 3350 17 G PO PACK
17.0000 g | PACK | Freq: Every day | ORAL | Status: DC
  Administered 2019-09-02 – 2019-09-05 (×3): 17 g via ORAL
  Filled 2019-09-02 (×3): qty 1

## 2019-09-02 MED ORDER — CARVEDILOL 3.125 MG PO TABS
3.1250 mg | ORAL_TABLET | Freq: Two times a day (BID) | ORAL | Status: DC
  Administered 2019-09-02 – 2019-09-05 (×4): 3.125 mg via ORAL
  Filled 2019-09-02 (×4): qty 1

## 2019-09-02 MED ORDER — SODIUM CHLORIDE 0.9% FLUSH
3.0000 mL | Freq: Two times a day (BID) | INTRAVENOUS | Status: DC
  Administered 2019-09-02 – 2019-09-04 (×2): 3 mL via INTRAVENOUS

## 2019-09-02 MED ORDER — POTASSIUM CHLORIDE 20 MEQ/15ML (10%) PO SOLN
40.0000 meq | ORAL | Status: AC
  Administered 2019-09-02 (×2): 40 meq via ORAL
  Filled 2019-09-02 (×2): qty 30

## 2019-09-02 MED ORDER — SPIRONOLACTONE 25 MG PO TABS
25.0000 mg | ORAL_TABLET | Freq: Once | ORAL | Status: AC
  Administered 2019-09-02: 25 mg via ORAL
  Filled 2019-09-02: qty 1

## 2019-09-02 MED ORDER — SODIUM CHLORIDE 0.9 % IV SOLN
250.0000 mL | INTRAVENOUS | Status: DC | PRN
  Administered 2019-09-03: 250 mL via INTRAVENOUS

## 2019-09-02 MED ORDER — SPIRONOLACTONE 25 MG PO TABS
50.0000 mg | ORAL_TABLET | Freq: Every day | ORAL | Status: DC
  Administered 2019-09-04 – 2019-09-05 (×2): 50 mg via ORAL
  Filled 2019-09-02 (×2): qty 2

## 2019-09-02 MED ORDER — SODIUM CHLORIDE 0.9% FLUSH: 3.0000 mL | INTRAVENOUS | Status: DC | PRN

## 2019-09-02 MED ORDER — ASPIRIN 81 MG PO CHEW
81.0000 mg | CHEWABLE_TABLET | ORAL | Status: AC
  Administered 2019-09-03: 81 mg via ORAL
  Filled 2019-09-02: qty 1

## 2019-09-02 NOTE — Progress Notes (Signed)
Patient has Left Triple Lumen Central Line.  Patient had low grade temp last PM.  Spoke to Darrick Grinder, NP about keeping central line, she stated that we need it for Coox collection.

## 2019-09-02 NOTE — Progress Notes (Signed)
NAME:  Peter Lambert, MRN:  [id number], DOB:  22-May-1945, LOS: 4 ADMISSION DATE:  08/29/2019, CONSULTATION DATE:  08/29/19 REFERRING MD: Reather Converse ,ED , CHIEF COMPLAINT:  Cardiac arrest, out of hospital  Brief History   74 year old man with hx of CAD, COPD, admitted 7/3 after out of hospital Vfib cardiac arrest with shock x2, epi x5, amio and bicarb before ROSC, suspected sepsis/CAP.  Unknown downtime, did not receive out of hospital CPR.   Peter Lambert was hospitalized with COPD exacerbation in 2/21, at which time he was sent home with O2.  He works at [employer] (overnight shifts, worked [employer]) and also is fully independent, though he is not very physically active.     Past Medical History  COPD (recent exacerbation 03/2019, sent home on oxygen, had only been using at home for past weeks) CAD, s/p DES to LAD in 2010, HLD, HTN, DM2, PAD Former tobacco use, quit 03/2019  [facility]   7/03 Admit post arrest  7/05 Extubated    Consults:    Procedures:  ETT 7/3 >> 7/5 L IJ TLC 7/3 >>  Significant Diagnostic Tests:  CT head neg, CT cervical spine 7/3 >> neg ECHO 7/4 >> LVEF 20%, LV with severely decreased function with global hypokinesis, moderate LVH, grade II diastolic dysfunction, slow flow and stasis in the LV at the apex  Micro Data:  BCx2 7/3 >> COVID 7/3 >> negative  Tracheal Aspirate 7/4 >>  RVP 7/5 >> negative   Antimicrobials:  Zosyn 7/3 x1 Vanc 7/3 x1 Doxy 7/3 x1 Cefepime 7/3 >> 7/4 Ceftriaxone 7/4 >>   Interim history/subjective:  Looks great this am! C/o chest pain from compressions.  Objective   Blood pressure 130/71, pulse 83, temperature 99.7 F (37.6 C), temperature source Oral, resp. rate 18, height 5\' 8"  (1.727 m), weight 76.7 kg, SpO2 99 %.        Intake/Output Summary (Last 24 hours) at 09/02/2019 0811 Last data filed at 09/02/2019 0436 Gross per 24 hour  Intake 260 ml  Output 550 ml  Net -290 ml   Filed Weights   08/31/19 0500  09/01/19 0630 09/02/19 0349  Weight: 78.5 kg 76.8 kg 76.7 kg    Examination:  GEN: elderly man in NAD HEENT: temporal wasting, MMM CV: Regular but occasional ectopic beats, ext warm PULM: severely diminished bilaterally, no accessory muscle use GI: Protuberant, soft, hypoactive BS EXT: No edema, + muscle wasting NEURO: moves all 4 ext to command PSYCH: AOx3 SKIN: No rashes, LIJ MML remains in place  K low: repleting Cr improved Doubt I/o right  Resolved Hospital Problem list   Shock - new LVEF reduction (cause or effect of arrest) vs septic component with CAP   Assessment & Plan:   VF Cardiac Arrest At Risk for Anoxic Encephalopathy  Acute Systolic Heart Failure  S/p prolonged out of hospital cardiac arrest.  Unknown downtime.  He was apparently asymptomatic prior to arrest, though exact details unclear.  PNA/CAP and hypokalemia likely contributing.  UDS negative. New reduction of LVEF (prior normal).  Hx of HTN, HLD, CAD, PAD s/p Aorto-Bi fem Home medications include Aspirin, Cardura, Metalazone, Lipitor, and Lopressor  Des to LAD in 2010, only on ASA at home - heparin, ASA, lipitor, amiodarone, RHC/LHC today or tomorrow - appreciate cardiology help  Acute Hypoxic Respiratory Failure  In the setting of V-fib arrest and CAP. RVP negative.  -wean O2 for sats >90% -rocephin x 5 days  Acute Metabolic  Encephalopathy  Resoled  AKI on CKD III Hypokalemia - improved, replete K  Anemia  Thrombocytopenia  - reactive, improving  COPD Tobacco Abuse -PRN albuterol  -okay to dc long acting nebs  DM II  Non-compliant / poorly controlled  -[employer] works for now  Looks great! PT/OT/progressive mobility  Okay to transfer to [facility] service, appreciate [facility] taking over patient's care

## 2019-09-02 NOTE — Progress Notes (Signed)
ANTICOAGULATION CONSULT NOTE - Follow-Up Consult  Pharmacy Consult for heparin Indication: chest pain/ACS  No Known Allergies  Patient Measurements: Height: 5\' 8"  (172.7 cm) Weight: 76.7 kg (169 lb 3.2 oz) IBW/kg (Calculated) : 68.4 Heparin Dosing Weight: 78kg  Vital Signs: Temp: 98.8 F (37.1 C) (07/07 0816) Temp Source: Oral (07/07 0816) BP: 131/65 (07/07 0958) Pulse Rate: 83 (07/07 0958)  Labs: Recent Labs     0000 08/31/19 0353 08/31/19 0353 08/31/19 0939 08/31/19 1256 08/31/19 1945 08/31/19 2149 09/01/19 0028 09/01/19 0500 09/01/19 0639 09/01/19 1400 09/02/19 0325 09/02/19 0326  HGB  --  10.5*   < >  --   --   --   --   --  9.6*  --   --  9.4*  --   HCT  --  33.4*  --   --   --   --   --   --  30.4*  --   --  29.2*  --   PLT  --  126*  --   --   --   --   --   --  126*  --   --  135*  --   HEPARINUNFRC  --   --   --   --   --    < >   < >  --   --  0.64 0.41  --  0.24*  CREATININE   < > 2.33*  --   --   --   --   --  2.25* 2.12*  --   --  1.71*  --   TROPONINIHS  --   --   --  837* 809*  --   --   --   --   --   --   --   --    < > = values in this interval not displayed.    Estimated Creatinine Clearance: 36.1 mL/min (A) (by C-G formula based on SCr of 1.71 mg/dL (H)).   Medical History: Past Medical History:  Diagnosis Date  . Coronary artery disease    a. s/p NSTEMI in 2010 with DES to proximal LAD with D1 jailed and angioplasty alone to ostium  . Hyperlipidemia   . Hypertension   . Hypertension   . Peripheral arterial disease ([place])   . Tobacco abuse   . Type 2 diabetes mellitus (HCC)     Assessment: 74 yoM admitted s/p VFib arrest initially thought to have STEMI then cancelled. Pharmacy now asked to start heparin gtt with plans for ischemia evaluation.   Heparin level this evening is subtherapeutic at 0.24 on 750 units/hr. RN drawing from central line and Heparin running in PIV. No bleeding or issues noted per RN. Plan for cath tomorrow,  09/03/19.   Goal of Therapy:  Heparin level 0.3-0.7 units/ml Monitor platelets by anticoagulation protocol: Yes   Plan:  -Increase heparin infusion to 850 units/hr (8.5 ml/hr) - Will continue to monitor for any signs/symptoms of bleeding  -Will follow up with heparin level in 8 hours   Thank you for allowing pharmacy to be a part of this patient's care.  Dimple Nanas, PharmD PGY-1 Acute Care Pharmacy Resident Office: [phone] 09/02/2019 10:20 AM

## 2019-09-02 NOTE — Progress Notes (Signed)
Transfer to [facility] as of 7/8 0700.  Hand off given to [facility] MD.     Noe Gens, MSN, NP-C [facility] 09/02/2019, 8:50 AM   Please see [email or web address] for pager details.

## 2019-09-02 NOTE — Care Management Important Message (Signed)
Important Message  Patient Details  Name: Peter Lambert MRN: [id number] Date of Birth: 12/19/1945   Medicare Important Message Given:  Yes     [name] 09/02/2019, 8:45 AM

## 2019-09-02 NOTE — Progress Notes (Signed)
Patient had 17 beats VT. Patient lying in bed, states he is comfortable BP=131/65 Noe Gens notified and aware

## 2019-09-02 NOTE — Progress Notes (Addendum)
Advanced Heart Failure Rounding Note  PCP-Cardiologist: Quay Burow, MD   Subjective:    Admitted with VF arrest and LV dysfunction. Shocked by EMS x2.   Extubated 7/5. Now on room air.   Tired this morning. 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 Denies SOB.   Objective:   Weight Range: 76.7 kg Body mass index is 25.73 kg/m.   Vital Signs:   Temp:  [97.8 F (36.6 C)-100.6 F (38.1 C)] 98.8 F (37.1 C) (07/07 0816) Pulse Rate:  [83-95] 83 (07/07 0958) Resp:  [16-26] 16 (07/07 0816) BP: (91-158)/(60-74) 131/65 (07/07 0958) SpO2:  [90 %-100 %] 100 % (07/07 0958) Weight:  [76.7 kg] 76.7 kg (07/07 0349) Last BM Date:  (pt unable to recall)  Weight change: Filed Weights   08/31/19 0500 09/01/19 0630 09/02/19 0349  Weight: 78.5 kg 76.8 kg 76.7 kg    Intake/Output:   Intake/Output Summary (Last 24 hours) at 09/02/2019 1003 Last data filed at 09/02/2019 0436 Gross per 24 hour  Intake 260 ml  Output 550 ml  Net -290 ml      Physical Exam   CVP 9-10 General:  Well appearing. No resp difficulty HEENT: normal Neck: supple. JVP 9-10. Carotids 2+ bilat; no bruits. No lymphadenopathy or thryomegaly appreciated. Cor: PMI nondisplaced. Regular rate & rhythm. No rubs, or murmurs.  =S3  Lungs: clear Abdomen: soft, nontender, nondistended. No hepatosplenomegaly. No bruits or masses. Good bowel sounds. Extremities: no cyanosis, clubbing, rash, edema Neuro: alert & orientedx3, cranial nerves grossly intact. moves all 4 extremities w/o difficulty. Affect pleasant   Telemetry   SR with PVCs and NSVT  Personally reviewed   EKG    N/a   Labs    CBC Recent Labs    09/01/19 0500 09/02/19 0325  WBC 9.0 9.3  HGB 9.6* 9.4*  HCT 30.4* 29.2*  MCV 87.9 88.0  PLT 126* 161*   Basic Metabolic Panel Recent Labs    08/31/19 1630 09/01/19 0028 09/01/19 0500 09/02/19 0325  NA  --    < > 139 138  K  --    < > 3.3* 3.0*  CL  --    < > 102 101  CO2  --    < > 25 27    GLUCOSE  --    < > 122* 118*  BUN  --    < > 48* 35*  CREATININE  --    < > 2.12* 1.71*  CALCIUM  --    < > 8.4* 8.3*  MG 2.1   < > 1.8 2.1  PHOS 4.0  --  3.3  --    < > = values in this interval not displayed.   Liver Function Tests Recent Labs    08/30/19 1006  AST 66*  ALT 50*  ALKPHOS 54  BILITOT 0.8  PROT 5.9*  ALBUMIN 2.9*   No results for input(s): LIPASE, AMYLASE in the last 72 hours. Cardiac Enzymes No results for input(s): CKTOTAL, CKMB, CKMBINDEX, TROPONINI in the last 72 hours.  BNP: BNP (last 3 results) Recent Labs    04/20/19 0846 04/23/19 2037 08/29/19 1819  BNP 580.0* 793.0* 202.2*    ProBNP (last 3 results) No results for input(s): PROBNP in the last 8760 hours.   D-Dimer No results for input(s): DDIMER in the last 72 hours. Hemoglobin A1C No results for input(s): HGBA1C in the last 72 hours. Fasting Lipid Panel No results for input(s): CHOL, HDL, LDLCALC, TRIG, CHOLHDL,  LDLDIRECT in the last 72 hours. Thyroid Function Tests No results for input(s): TSH, T4TOTAL, T3FREE, THYROIDAB in the last 72 hours.  Invalid input(s): FREET3  Other results:   Imaging    No results found.   Medications:     Scheduled Medications: . acetaminophen  650 mg Oral Q4H while awake  . amiodarone  200 mg Oral BID  . aspirin  81 mg Per Tube Daily  . atorvastatin  80 mg Per Tube Daily  . Chlorhexidine Gluconate Cloth  6 each Topical Daily  . docusate  100 mg Oral BID  . insulin aspart  0-9 Units Subcutaneous TID WC  . mouth rinse  15 mL Mouth Rinse BID  . pantoprazole  40 mg Oral QHS  . polyethylene glycol  17 g Oral Daily  . potassium chloride  40 mEq Oral Q2H  . sodium chloride flush  10-40 mL Intracatheter Q12H  . sodium chloride flush  3 mL Intravenous Q12H  . spironolactone  25 mg Oral Daily    Infusions: . cefTRIAXone (ROCEPHIN)  IV 2 g (09/01/19 1049)  . heparin 850 Units/hr (09/02/19 0845)    PRN Medications: albuterol, docusate  sodium, sodium chloride flush    Patient Profile  Peter Lambert a 74 y.o.with DM2, HTN, PAD s/p aorto-bifem graft, tobacco use and CAD s/p NSTEMI in April 2010. Cath showed revealed high-grade proximal LAD stenosis -> PCI 2.75 x 18 mm long Xience V drug-eluting stent. He had a dominant circumflex system with an 80% distal AV groove circumflex as well as a nondominant RCA. He had normal LV function. Abdominal aortogram revealed a patent aortobifemoral bypass graft.    Assessment/Plan   1. VF arrest - suspect ischemic in nature but based on ECG it is possible that he has PVC cardiomyopathy leading to primary VF event - Amio drip was not started due to low heart rate. Continue amio po 200 mg twice a day.  - plan R/L cath tomorrow.  - Keep K > 4.0 Mg > 2.0 - Supp K  - Mag ok.    2. NSTEMI with known CAD and LAD stent 4/10 - has h/o CAD - hstrop peaked at 3,894 - Continue heparin. - continue ASA, statin - Add coreg 3.125 mg twice a day.  - cath tomorrow.   3. Acute systolic HF due to severe biventricular dysfunction - Echo 08/30/19 EF < 20% RV severely HKPersonally reviewed - Echo 2/21 EF 50-55% - Unclear if due to ischemia, arrest or PVCs -CO-OX 73% - Coreg 3.125 mg twice a day. .  - Supp K  - Continue spiro 25 mg daily. - no ARB/ARNI or dig yet with AKI - R/L cath in am  4. Acute hypoxic respiratory failure - due to #1 - Extubated 7/5. Sats stable on room air.   5. AKI on CKD 3b - baseline creatinine ~1.5-1.8 - Creatinine 2.3>2.1>1.7   6. Bilateral aspiration PNA - PCT 3.97. On ceftriaxone  7. PAD - s/p aortobifem graft  8. Anemia  Hgb trending down. 10.5>9.6>9.4 .  No obvious source.  Check anemia panel.   Consult cardiac rehab.  Plan RHC LHC in am.   Length of Stay: 4  Amy Clegg, NP  09/02/2019, 10:03 AM  Advanced Heart Failure Team Pager [phone] (M-F; 7a - 4p)  Please contact [facility] for night-coverage after hours (4p -7a ) and weekends on  [email or web address]   Patient seen and examined with the above-signed Advanced Practice Provider and/or Housestaff. I personally reviewed  laboratory data, imaging studies and relevant notes. I independently examined the patient and formulated the important aspects of the plan. I have edited the note to reflect any of my changes or salient points. I have personally discussed the plan with the patient and/or family.  Rhythm remains stable on po amiodarone. Co-ox 73% CVP 9-10. Renal function improved.  General:  Well appearing. No resp difficulty HEENT: normal Neck: supple. no JVD. Carotids 2+ bilat; no bruits. No lymphadenopathy or thryomegaly appreciated. Cor: PMI nondisplaced. Regular rate & rhythm. No rubs, gallops or murmurs. Lungs: clear Abdomen: soft, nontender, nondistended. No hepatosplenomegaly. No bruits or masses. Good bowel sounds. Extremities: no cyanosis, clubbing, rash, edema Neuro: alert & orientedx3, cranial nerves grossly intact. moves all 4 extremities w/o difficulty. Affect pleasant  Continues to improve after cardiac arrest. Plan R/L cath tomorrow to assess for underlying CAD. Suspect PVCs may be playing a role in LV dysfunction. Will likely need LifeVest at d/c (versus ICD).   Glori Bickers, MD  12:44 PM

## 2019-09-02 NOTE — H&P (View-Only) (Signed)
Advanced Heart Failure Rounding Note  PCP-Cardiologist: Quay Burow, MD   Subjective:    Admitted with VF arrest and LV dysfunction. Shocked by EMS x2.   Extubated 7/5. Now on room air.   Tired this morning. 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 Denies SOB.   Objective:   Weight Range: 76.7 kg Body mass index is 25.73 kg/m.   Vital Signs:   Temp:  [97.8 F (36.6 C)-100.6 F (38.1 C)] 98.8 F (37.1 C) (07/07 0816) Pulse Rate:  [83-95] 83 (07/07 0958) Resp:  [16-26] 16 (07/07 0816) BP: (91-158)/(60-74) 131/65 (07/07 0958) SpO2:  [90 %-100 %] 100 % (07/07 0958) Weight:  [76.7 kg] 76.7 kg (07/07 0349) Last BM Date:  (pt unable to recall)  Weight change: Filed Weights   08/31/19 0500 09/01/19 0630 09/02/19 0349  Weight: 78.5 kg 76.8 kg 76.7 kg    Intake/Output:   Intake/Output Summary (Last 24 hours) at 09/02/2019 1003 Last data filed at 09/02/2019 0436 Gross per 24 hour  Intake 260 ml  Output 550 ml  Net -290 ml      Physical Exam   CVP 9-10 General:  Well appearing. No resp difficulty HEENT: normal Neck: supple. JVP 9-10. Carotids 2+ bilat; no bruits. No lymphadenopathy or thryomegaly appreciated. Cor: PMI nondisplaced. Regular rate & rhythm. No rubs, or murmurs.  =S3  Lungs: clear Abdomen: soft, nontender, nondistended. No hepatosplenomegaly. No bruits or masses. Good bowel sounds. Extremities: no cyanosis, clubbing, rash, edema Neuro: alert & orientedx3, cranial nerves grossly intact. moves all 4 extremities w/o difficulty. Affect pleasant   Telemetry   SR with PVCs and NSVT  Personally reviewed   EKG    N/a   Labs    CBC Recent Labs    09/01/19 0500 09/02/19 0325  WBC 9.0 9.3  HGB 9.6* 9.4*  HCT 30.4* 29.2*  MCV 87.9 88.0  PLT 126* 132*   Basic Metabolic Panel Recent Labs    08/31/19 1630 09/01/19 0028 09/01/19 0500 09/02/19 0325  NA  --    < > 139 138  K  --    < > 3.3* 3.0*  CL  --    < > 102 101  CO2  --    < > 25 27    GLUCOSE  --    < > 122* 118*  BUN  --    < > 48* 35*  CREATININE  --    < > 2.12* 1.71*  CALCIUM  --    < > 8.4* 8.3*  MG 2.1   < > 1.8 2.1  PHOS 4.0  --  3.3  --    < > = values in this interval not displayed.   Liver Function Tests Recent Labs    08/30/19 1006  AST 66*  ALT 50*  ALKPHOS 54  BILITOT 0.8  PROT 5.9*  ALBUMIN 2.9*   No results for input(s): LIPASE, AMYLASE in the last 72 hours. Cardiac Enzymes No results for input(s): CKTOTAL, CKMB, CKMBINDEX, TROPONINI in the last 72 hours.  BNP: BNP (last 3 results) Recent Labs    04/20/19 0846 04/23/19 2037 08/29/19 1819  BNP 580.0* 793.0* 202.2*    ProBNP (last 3 results) No results for input(s): PROBNP in the last 8760 hours.   D-Dimer No results for input(s): DDIMER in the last 72 hours. Hemoglobin A1C No results for input(s): HGBA1C in the last 72 hours. Fasting Lipid Panel No results for input(s): CHOL, HDL, LDLCALC, TRIG, CHOLHDL,  LDLDIRECT in the last 72 hours. Thyroid Function Tests No results for input(s): TSH, T4TOTAL, T3FREE, THYROIDAB in the last 72 hours.  Invalid input(s): FREET3  Other results:   Imaging    No results found.   Medications:     Scheduled Medications: . acetaminophen  650 mg Oral Q4H while awake  . amiodarone  200 mg Oral BID  . aspirin  81 mg Per Tube Daily  . atorvastatin  80 mg Per Tube Daily  . Chlorhexidine Gluconate Cloth  6 each Topical Daily  . docusate  100 mg Oral BID  . insulin aspart  0-9 Units Subcutaneous TID WC  . mouth rinse  15 mL Mouth Rinse BID  . pantoprazole  40 mg Oral QHS  . polyethylene glycol  17 g Oral Daily  . potassium chloride  40 mEq Oral Q2H  . sodium chloride flush  10-40 mL Intracatheter Q12H  . sodium chloride flush  3 mL Intravenous Q12H  . spironolactone  25 mg Oral Daily    Infusions: . cefTRIAXone (ROCEPHIN)  IV 2 g (09/01/19 1049)  . heparin 850 Units/hr (09/02/19 0845)    PRN Medications: albuterol, docusate  sodium, sodium chloride flush    Patient Profile  Peter Lambert a 74 y.o.with DM2, HTN, PAD s/p aorto-bifem graft, tobacco use and CAD s/p NSTEMI in April 2010. Cath showed revealed high-grade proximal LAD stenosis -> PCI 2.75 x 18 mm long Xience V drug-eluting stent. He had a dominant circumflex system with an 80% distal AV groove circumflex as well as a nondominant RCA. He had normal LV function. Abdominal aortogram revealed a patent aortobifemoral bypass graft.    Assessment/Plan   1. VF arrest - suspect ischemic in nature but based on ECG it is possible that he has PVC cardiomyopathy leading to primary VF event - Amio drip was not started due to low heart rate. Continue amio po 200 mg twice a day.  - plan R/L cath tomorrow.  - Keep K > 4.0 Mg > 2.0 - Supp K  - Mag ok.    2. NSTEMI with known CAD and LAD stent 4/10 - has h/o CAD - hstrop peaked at 3,894 - Continue heparin. - continue ASA, statin - Add coreg 3.125 mg twice a day.  - cath tomorrow.   3. Acute systolic HF due to severe biventricular dysfunction - Echo 08/30/19 EF < 20% RV severely HKPersonally reviewed - Echo 2/21 EF 50-55% - Unclear if due to ischemia, arrest or PVCs -CO-OX 73% - Coreg 3.125 mg twice a day. .  - Supp K  - Continue spiro 25 mg daily. - no ARB/ARNI or dig yet with AKI - R/L cath in am  4. Acute hypoxic respiratory failure - due to #1 - Extubated 7/5. Sats stable on room air.   5. AKI on CKD 3b - baseline creatinine ~1.5-1.8 - Creatinine 2.3>2.1>1.7   6. Bilateral aspiration PNA - PCT 3.97. On ceftriaxone  7. PAD - s/p aortobifem graft  8. Anemia  Hgb trending down. 10.5>9.6>9.4 .  No obvious source.  Check anemia panel.   Consult cardiac rehab.  Plan [facility] LHC in am.   Length of Stay: 4  Amy Clegg, NP  09/02/2019, 10:03 AM  Advanced Heart Failure Team Pager [phone] (M-F; 7a - 4p)  Please contact [facility] for night-coverage after hours (4p -7a ) and weekends on  [email or web address]   Patient seen and examined with the above-signed Advanced Practice Provider and/or Housestaff. I personally reviewed  laboratory data, imaging studies and relevant notes. I independently examined the patient and formulated the important aspects of the plan. I have edited the note to reflect any of my changes or salient points. I have personally discussed the plan with the patient and/or family.  Rhythm remains stable on po amiodarone. Co-ox 73% CVP 9-10. Renal function improved.  General:  Well appearing. No resp difficulty HEENT: normal Neck: supple. no JVD. Carotids 2+ bilat; no bruits. No lymphadenopathy or thryomegaly appreciated. Cor: PMI nondisplaced. Regular rate & rhythm. No rubs, gallops or murmurs. Lungs: clear Abdomen: soft, nontender, nondistended. No hepatosplenomegaly. No bruits or masses. Good bowel sounds. Extremities: no cyanosis, clubbing, rash, edema Neuro: alert & orientedx3, cranial nerves grossly intact. moves all 4 extremities w/o difficulty. Affect pleasant  Continues to improve after cardiac arrest. Plan R/L cath tomorrow to assess for underlying CAD. Suspect PVCs may be playing a role in LV dysfunction. Will likely need LifeVest at d/c (versus ICD).   Glori Bickers, MD  12:44 PM

## 2019-09-02 NOTE — Progress Notes (Signed)
   09/01/19 2349  Assess: MEWS Score  Temp (!) 100.6 F (38.1 C)  BP 91/62  Pulse Rate 95  ECG Heart Rate 95  Resp 19  Level of Consciousness Alert  SpO2 98 %  O2 Device Nasal Cannula  Assess: MEWS Score  MEWS Temp 1  MEWS Systolic 1  MEWS Pulse 0  MEWS RR 0  MEWS LOC 0  MEWS Score 2  MEWS Score Color Yellow  Assess: if the MEWS score is Yellow or Red  Were vital signs taken at a resting state? Yes  Focused Assessment Documented focused assessment  Early Detection of Sepsis Score *See Row Information* High  MEWS guidelines implemented *See Row Information* Yes  Treat  MEWS Interventions Administered prn meds/treatments  Take Vital Signs  Increase Vital Sign Frequency  Yellow: Q 2hr X 2 then Q 4hr X 2, if remains yellow, continue Q 4hrs  Escalate  MEWS: Escalate Yellow: discuss with charge nurse/RN and consider discussing with provider and RRT  Notify: Charge Nurse/RN  Name of Charge Nurse/RN Notified Henderson Newcomer, RN  Date Charge Nurse/RN Notified 09/02/19  Time Charge Nurse/RN Notified 0100   Elaina Hoops, RN

## 2019-09-03 ENCOUNTER — Encounter (HOSPITAL_COMMUNITY)
Admission: EM | Disposition: A | Discharge status: Home Health | Payer: Self-pay | Source: Home / Self Care | Attending: Internal Medicine

## 2019-09-03 ENCOUNTER — Encounter (HOSPITAL_COMMUNITY): Payer: Self-pay | Admitting: Internal Medicine

## 2019-09-03 DIAGNOSIS — I2510 Atherosclerotic heart disease of native coronary artery without angina pectoris: Secondary | ICD-10-CM

## 2019-09-03 DIAGNOSIS — G9341 Metabolic encephalopathy: Secondary | ICD-10-CM

## 2019-09-03 HISTORY — PX: CORONARY STENT INTERVENTION: CATH118234

## 2019-09-03 HISTORY — PX: RIGHT/LEFT HEART CATH AND CORONARY ANGIOGRAPHY: CATH118266

## 2019-09-03 LAB — CULTURE, BLOOD (ROUTINE X 2)
Culture: NO GROWTH
Culture: NO GROWTH
Special Requests: ADEQUATE

## 2019-09-03 LAB — POCT I-STAT EG7
Acid-Base Excess: 0 mmol/L (ref 0.0–2.0)
Acid-Base Excess: 4 mmol/L — ABNORMAL HIGH (ref 0.0–2.0)
Bicarbonate: 26.8 mmol/L (ref 20.0–28.0)
Bicarbonate: 29.8 mmol/L — ABNORMAL HIGH (ref 20.0–28.0)
Calcium, Ion: 1.1 mmol/L — ABNORMAL LOW (ref 1.15–1.40)
Calcium, Ion: 1.22 mmol/L (ref 1.15–1.40)
HCT: 30 % — ABNORMAL LOW (ref 39.0–52.0)
HCT: 30 % — ABNORMAL LOW (ref 39.0–52.0)
Hemoglobin: 10.2 g/dL — ABNORMAL LOW (ref 13.0–17.0)
Hemoglobin: 10.2 g/dL — ABNORMAL LOW (ref 13.0–17.0)
O2 Saturation: 65 %
O2 Saturation: 65 %
Potassium: 3.5 mmol/L (ref 3.5–5.1)
Potassium: 3.9 mmol/L (ref 3.5–5.1)
Sodium: 137 mmol/L (ref 135–145)
Sodium: 141 mmol/L (ref 135–145)
TCO2: 28 mmol/L (ref 22–32)
TCO2: 31 mmol/L (ref 22–32)
pCO2, Ven: 51.3 mmHg (ref 44.0–60.0)
pCO2, Ven: 52.9 mmHg (ref 44.0–60.0)
pH, Ven: 7.327 (ref 7.250–7.430)
pH, Ven: 7.359 (ref 7.250–7.430)
pO2, Ven: 36 mmHg (ref 32.0–45.0)
pO2, Ven: 37 mmHg (ref 32.0–45.0)

## 2019-09-03 LAB — COOXEMETRY PANEL
Carboxyhemoglobin: 1.3 % (ref 0.5–1.5)
Methemoglobin: 1.8 % — ABNORMAL HIGH (ref 0.0–1.5)
O2 Saturation: 57.1 %
Total hemoglobin: 10.4 g/dL — ABNORMAL LOW (ref 12.0–16.0)

## 2019-09-03 LAB — CBC
HCT: 32 % — ABNORMAL LOW (ref 39.0–52.0)
Hemoglobin: 9.9 g/dL — ABNORMAL LOW (ref 13.0–17.0)
MCH: 27.6 pg (ref 26.0–34.0)
MCHC: 30.9 g/dL (ref 30.0–36.0)
MCV: 89.1 fL (ref 80.0–100.0)
Platelets: 167 10*3/uL (ref 150–400)
RBC: 3.59 MIL/uL — ABNORMAL LOW (ref 4.22–5.81)
RDW: 15.2 % (ref 11.5–15.5)
WBC: 7.6 10*3/uL (ref 4.0–10.5)
nRBC: 0 % (ref 0.0–0.2)

## 2019-09-03 LAB — BASIC METABOLIC PANEL
Anion gap: 9 (ref 5–15)
BUN: 23 mg/dL (ref 8–23)
CO2: 29 mmol/L (ref 22–32)
Calcium: 9.1 mg/dL (ref 8.9–10.3)
Chloride: 102 mmol/L (ref 98–111)
Creatinine, Ser: 1.45 mg/dL — ABNORMAL HIGH (ref 0.61–1.24)
GFR calc Af Amer: 54 mL/min — ABNORMAL LOW (ref >60)
GFR calc non Af Amer: 47 mL/min — ABNORMAL LOW (ref >60)
Glucose, Bld: 108 mg/dL — ABNORMAL HIGH (ref 70–99)
Potassium: 3.9 mmol/L (ref 3.5–5.1)
Sodium: 140 mmol/L (ref 135–145)

## 2019-09-03 LAB — POCT I-STAT 7, (LYTES, BLD GAS, ICA,H+H)
Acid-Base Excess: 4 mmol/L — ABNORMAL HIGH (ref 0.0–2.0)
Bicarbonate: 28.8 mmol/L — ABNORMAL HIGH (ref 20.0–28.0)
Calcium, Ion: 1.14 mmol/L — ABNORMAL LOW (ref 1.15–1.40)
HCT: 30 % — ABNORMAL LOW (ref 39.0–52.0)
Hemoglobin: 10.2 g/dL — ABNORMAL LOW (ref 13.0–17.0)
O2 Saturation: 99 %
Potassium: 3.7 mmol/L (ref 3.5–5.1)
Sodium: 144 mmol/L (ref 135–145)
TCO2: 30 mmol/L (ref 22–32)
pCO2 arterial: 45.8 mmHg (ref 32.0–48.0)
pH, Arterial: 7.407 (ref 7.350–7.450)
pO2, Arterial: 134 mmHg — ABNORMAL HIGH (ref 83.0–108.0)

## 2019-09-03 LAB — POCT ACTIVATED CLOTTING TIME: Activated Clotting Time: 268 seconds

## 2019-09-03 LAB — GLUCOSE, CAPILLARY
Glucose-Capillary: 139 mg/dL — ABNORMAL HIGH (ref 70–99)
Glucose-Capillary: 119 mg/dL — ABNORMAL HIGH (ref 70–99)
Glucose-Capillary: 119 mg/dL — ABNORMAL HIGH (ref 70–99)

## 2019-09-03 LAB — HEPARIN LEVEL (UNFRACTIONATED): Heparin Unfractionated: 0.21 IU/mL — ABNORMAL LOW (ref 0.30–0.70)

## 2019-09-03 LAB — MAGNESIUM: Magnesium: 2.1 mg/dL (ref 1.7–2.4)

## 2019-09-03 SURGERY — RIGHT/LEFT HEART CATH AND CORONARY ANGIOGRAPHY
Anesthesia: LOCAL

## 2019-09-03 MED ORDER — SODIUM CHLORIDE 0.9 % IV SOLN: INTRAVENOUS | Status: AC

## 2019-09-03 MED ORDER — LIDOCAINE HCL (PF) 1 % IJ SOLN
INTRAMUSCULAR | Status: DC | PRN
  Administered 2019-09-03 (×2): 2 mL via INTRADERMAL

## 2019-09-03 MED ORDER — ENOXAPARIN SODIUM 40 MG/0.4ML ~~LOC~~ SOLN: 40.0000 mg | SUBCUTANEOUS | Status: DC

## 2019-09-03 MED ORDER — MIDAZOLAM HCL 2 MG/2ML IJ SOLN
INTRAMUSCULAR | Status: DC | PRN
  Administered 2019-09-03: 1 mg via INTRAVENOUS

## 2019-09-03 MED ORDER — HEPARIN SODIUM (PORCINE) 1000 UNIT/ML IJ SOLN
INTRAMUSCULAR | Status: DC | PRN
  Administered 2019-09-03: 6000 [IU] via INTRAVENOUS
  Administered 2019-09-03: 2000 [IU] via INTRAVENOUS

## 2019-09-03 MED ORDER — SODIUM CHLORIDE 0.9 % IV SOLN
80.0000 mg | INTRAVENOUS | Status: AC
  Administered 2019-09-04: 80 mg

## 2019-09-03 MED ORDER — CHLORHEXIDINE GLUCONATE 4 % EX LIQD
60.0000 mL | Freq: Once | CUTANEOUS | Status: DC
  Administered 2019-09-04: 4 via TOPICAL

## 2019-09-03 MED ORDER — CEFAZOLIN SODIUM-DEXTROSE 2-4 GM/100ML-% IV SOLN
2.0000 g | INTRAVENOUS | Status: AC
  Administered 2019-09-04: 2 g via INTRAVENOUS

## 2019-09-03 MED ORDER — NITROGLYCERIN 1 MG/10 ML FOR IR/CATH LAB
INTRA_ARTERIAL | Status: AC
  Filled 2019-09-03: qty 10

## 2019-09-03 MED ORDER — SODIUM CHLORIDE 0.9 % IV SOLN: 250.0000 mL | INTRAVENOUS | Status: DC | PRN

## 2019-09-03 MED ORDER — IOHEXOL 350 MG/ML SOLN
INTRAVENOUS | Status: DC | PRN
  Administered 2019-09-03: 35 mL via INTRA_ARTERIAL

## 2019-09-03 MED ORDER — HYDRALAZINE HCL 20 MG/ML IJ SOLN: 10.0000 mg | INTRAMUSCULAR | Status: AC | PRN

## 2019-09-03 MED ORDER — HEPARIN SODIUM (PORCINE) 1000 UNIT/ML IJ SOLN
INTRAMUSCULAR | Status: AC
  Filled 2019-09-03: qty 1

## 2019-09-03 MED ORDER — VERAPAMIL HCL 2.5 MG/ML IV SOLN
INTRAVENOUS | Status: DC | PRN
  Administered 2019-09-03: 10 mL via INTRA_ARTERIAL

## 2019-09-03 MED ORDER — SODIUM CHLORIDE 0.9 % IV SOLN: INTRAVENOUS | Status: DC

## 2019-09-03 MED ORDER — ACETAMINOPHEN 325 MG PO TABS: 650.0000 mg | ORAL_TABLET | ORAL | Status: DC | PRN

## 2019-09-03 MED ORDER — SODIUM CHLORIDE 0.9% FLUSH: 3.0000 mL | INTRAVENOUS | Status: DC | PRN

## 2019-09-03 MED ORDER — CLOPIDOGREL BISULFATE 300 MG PO TABS
ORAL_TABLET | ORAL | Status: DC | PRN
  Administered 2019-09-03: 300 mg via ORAL

## 2019-09-03 MED ORDER — HEPARIN (PORCINE) IN NACL 1000-0.9 UT/500ML-% IV SOLN
INTRAVENOUS | Status: DC | PRN
  Administered 2019-09-03 (×2): 500 mL

## 2019-09-03 MED ORDER — IOHEXOL 350 MG/ML SOLN
INTRAVENOUS | Status: DC | PRN
  Administered 2019-09-03: 45 mL

## 2019-09-03 MED ORDER — FENTANYL CITRATE (PF) 100 MCG/2ML IJ SOLN
INTRAMUSCULAR | Status: DC | PRN
  Administered 2019-09-03: 25 ug via INTRAVENOUS

## 2019-09-03 MED ORDER — HYDROCORTISONE 1 % EX CREA
TOPICAL_CREAM | Freq: Two times a day (BID) | CUTANEOUS | Status: DC | PRN
  Filled 2019-09-03: qty 28

## 2019-09-03 MED ORDER — VERAPAMIL HCL 2.5 MG/ML IV SOLN
INTRAVENOUS | Status: AC
  Filled 2019-09-03: qty 2

## 2019-09-03 MED ORDER — HEPARIN SODIUM (PORCINE) 1000 UNIT/ML IJ SOLN
INTRAMUSCULAR | Status: DC | PRN
  Administered 2019-09-03: 4000 [IU] via INTRAVENOUS

## 2019-09-03 MED ORDER — CLOPIDOGREL BISULFATE 75 MG PO TABS: 75.0000 mg | ORAL_TABLET | Freq: Every day | ORAL | Status: DC

## 2019-09-03 MED ORDER — CHLORHEXIDINE GLUCONATE 4 % EX LIQD
60.0000 mL | Freq: Once | CUTANEOUS | Status: AC
  Administered 2019-09-03: 4 via TOPICAL
  Filled 2019-09-03: qty 60

## 2019-09-03 MED ORDER — CLOPIDOGREL BISULFATE 300 MG PO TABS
ORAL_TABLET | ORAL | Status: AC
  Filled 2019-09-03: qty 1

## 2019-09-03 MED ORDER — LABETALOL HCL 5 MG/ML IV SOLN: 10.0000 mg | INTRAVENOUS | Status: AC | PRN

## 2019-09-03 MED ORDER — FENTANYL CITRATE (PF) 100 MCG/2ML IJ SOLN
INTRAMUSCULAR | Status: AC
  Filled 2019-09-03: qty 2

## 2019-09-03 MED ORDER — MIDAZOLAM HCL 2 MG/2ML IJ SOLN
INTRAMUSCULAR | Status: AC
  Filled 2019-09-03: qty 2

## 2019-09-03 MED ORDER — SODIUM CHLORIDE 0.9% FLUSH
3.0000 mL | Freq: Two times a day (BID) | INTRAVENOUS | Status: DC
  Administered 2019-09-03: 3 mL via INTRAVENOUS

## 2019-09-03 MED ORDER — ONDANSETRON HCL 4 MG/2ML IJ SOLN: 4.0000 mg | Freq: Four times a day (QID) | INTRAMUSCULAR | Status: DC | PRN

## 2019-09-03 MED ORDER — HEPARIN (PORCINE) IN NACL 1000-0.9 UT/500ML-% IV SOLN
INTRAVENOUS | Status: AC
  Filled 2019-09-03: qty 1000

## 2019-09-03 MED ORDER — LIDOCAINE HCL (PF) 1 % IJ SOLN
INTRAMUSCULAR | Status: AC
  Filled 2019-09-03: qty 30

## 2019-09-03 SURGICAL SUPPLY — 13 items
CATH 5FR JL3.5 JR4 ANG PIG MP (CATHETERS) ×1 IMPLANT
CATH BALLN WEDGE 5F 110CM (CATHETERS) ×1 IMPLANT
CATH LAUNCHER 6FR EBU3.5 (CATHETERS) ×1 IMPLANT
DEVICE RAD COMP TR BAND LRG (VASCULAR PRODUCTS) ×1 IMPLANT
GLIDESHEATH SLEND SS 6F .021 (SHEATH) ×1 IMPLANT
GUIDEWIRE .025 260CM (WIRE) ×1 IMPLANT
GUIDEWIRE INQWIRE 1.5J.035X260 (WIRE) IMPLANT
INQWIRE 1.5J .035X260CM (WIRE) ×2
KIT ENCORE 26 ADVANTAGE (KITS) ×1 IMPLANT
PACK CARDIAC CATHETERIZATION (CUSTOM PROCEDURE TRAY) ×2 IMPLANT
SHEATH GLIDE SLENDER 4/5FR (SHEATH) ×1 IMPLANT
TRANSDUCER W/STOPCOCK (MISCELLANEOUS) ×2 IMPLANT
WIRE COUGAR XT STRL 190CM (WIRE) ×1 IMPLANT

## 2019-09-03 NOTE — Progress Notes (Addendum)
OT Cancellation Note  Patient Details Name: Peter Lambert MRN: [id number] DOB: 1945/08/17   Cancelled Treatment:    Reason Eval/Treat Not Completed: Other (comment) Upon arrival patient back from cath however cannot initiate loosing TR band until 11, may be ready by 1230-1300. Will check back as schedule permits.  Checked back with RN around 1330, states TR band still on. Will re-attempt 7/9 as schedule permits.  Delbert Phenix OT OT office: [phone]    Peter Lambert 09/03/2019, 10:35 AM

## 2019-09-03 NOTE — Interval H&P Note (Signed)
History and Physical Interval Note:  09/03/2019 8:31 AM  Peter Lambert  has presented today for surgery, with the diagnosis of chf.  The various methods of treatment have been discussed with the patient and family. After consideration of risks, benefits and other options for treatment, the patient has consented to  Procedure(s): RIGHT/LEFT HEART CATH AND CORONARY ANGIOGRAPHY (N/A) and possible coronary angioplasty as a surgical intervention.  [name] history has been reviewed, patient examined, no change in status, stable for surgery.  I have reviewed the patient's chart and labs.  Questions were answered to the patient's satisfaction.     Leonid Manus

## 2019-09-03 NOTE — TOC Initial Note (Signed)
Transition of Care [facility]) - Initial/Assessment Note    Patient Details  Name: Peter Lambert MRN: [id number] Date of Birth: [birth year]  [facility]) CM/SW Contact:    Ninfa Meeker, RN Phone Number: [phone] (working remotely) 09/03/2019, 10:44 AM  Clinical Narrative: 74 year old man with hx of CAD, COPD, admitted 7/3 after out of hospital Vfib cardiac arrest with shock x2.  S/p R/L Heart cath today. Case manager spoke with patient via telephone to discuss need for [facility] services. Choice for Agencies offered. Patient said he thinks he used [facility] in [facility] past. CM contacted [facility] Liaison , Havery Moros and was told they can not accept patient unless paired with a Medicare patient. Case manager contacted Jenny Reichmann with [facility] and they are able to provide services to patient with Start of care being next week. TOC team will continue to monitor for needs.    Expected Discharge Plan: [place] Barriers to Discharge: Continued Medical Work up   Patient Goals and CMS Choice Patient states their goals for this hospitalization and ongoing recovery are:: continue to get better CMS Medicare.gov Compare Post Acute Care list provided to:: Patient Choice offered to / list presented to : Patient (disucssed via telephone)  Expected Discharge Plan and Services Expected Discharge Plan: [place] In-house Referral: NA Discharge Planning Services: CM Consult Post Acute Care Choice: [place] arrangements for the past 2 months: Single Family Home                           HH Arranged: PT, OT HH Agency: [place] Date [facility] Agency Contacted: 09/03/19 Time HH Agency Contacted: [phone] Representative spoke with at [place]: [place] Arrangements/Services Living arrangements for the past 2 months: [place] with:: Adult Children (patient states his younest son lives with him) Patient  language and need for interpreter reviewed:: Yes Do you feel safe going back to the place where you live?: Yes      Need for Family Participation in Patient Care: Yes (Comment) Care giver support system in place?: Yes (comment)   Criminal Activity/Legal Involvement Pertinent to Current Situation/Hospitalization: No - Comment as needed  Activities of Daily Living Home Assistive Devices/Equipment: None ADL Screening (condition at time of admission) Patient's cognitive ability adequate to safely complete daily activities?: Yes Is the patient deaf or have difficulty hearing?: No Does the patient have difficulty seeing, even when wearing glasses/contacts?: No Does the patient have difficulty concentrating, remembering, or making decisions?: No Patient able to express need for assistance with ADLs?: No Does the patient have difficulty dressing or bathing?: No Independently performs ADLs?: Yes (appropriate for developmental age) Does the patient have difficulty walking or climbing stairs?: No Weakness of Legs: None Weakness of Arms/Hands: None  Permission Sought/Granted Permission sought to share information with : Case Manager       Permission granted to share info w AGENCY: [facility]mings        Emotional Assessment   Attitude/Demeanor/Rapport: Set designer, Engaged   Orientation: : Oriented to Place, Oriented to  Time, Oriented to Self, Oriented to Situation Alcohol / Substance Use: Not Applicable Psych Involvement: No (comment)  Admission diagnosis:  Cardiac arrest ([place]) [I46.9] Pneumonia of both lungs due to infectious organism, unspecified part of lung [J18.9] Sepsis, due to unspecified organism, unspecified whether acute organ dysfunction present [facility]) [A41.9] Patient Active Problem  List   Diagnosis Date Noted  . AKI (acute kidney injury) ([place]) 09/02/2019  . Hypokalemia 09/02/2019  . Hyperglycemia 09/02/2019  . Hypoalbuminemia 09/02/2019  . Elevated brain natriuretic peptide (BNP)  level 09/02/2019  . Lactic acidosis 09/02/2019  . Leukocytosis 09/02/2019  . Proteinuria 09/02/2019  . Hypomagnesemia 09/02/2019  . NSTEMI (non-ST elevated myocardial infarction) ([place]) 09/02/2019  . CKD (chronic kidney disease), stage III b 09/02/2019  . PVC's (premature ventricular contractions) 09/02/2019  . NSVT (nonsustained ventricular tachycardia) ([place]) 09/02/2019  . Thrombocytopenia ([place]) 09/02/2019  . Anemia of infection and chronic disease 09/02/2019  . Severe sepsis with septic shock ([place]) suspected on admission, source: ? aspiration 09/02/2019  . Acute metabolic encephalopathy 54/00/8676  . Acute systolic CHF (congestive heart failure) ([place]) 09/02/2019  . Anticoagulated on heparin 09/02/2019  . History of noncompliance with medical treatment 09/02/2019  . Cardiac arrest with ventricular fibrillation ([place])   . Acute on chronic respiratory failure with hypoxia ([place]) 04/23/2019  . Lobar pneumonia ([place]) 04/22/2019  . Aspiration pneumonia ([place]), bilateral   . Demand ischemia of myocardium ([place])   . Atrial flutter ([place])   . Acute on chronic diastolic heart failure ([place])   . Acute respiratory failure with hypoxemia ([place]) 07/02/2016  . COPD exacerbation ([place]) 07/02/2016  . Tobacco use 07/17/2013  . Peripheral arterial disease ([place]) 12/03/2012  . Coronary artery disease s/p DES to LAD in 2010, s/p PCI to proximal LAD stenosis with DES this hospitalization 12/03/2012  . Essential hypertension 12/03/2012  . Hyperlipidemia 12/03/2012  . New onset type 2 diabetes mellitus ([place]) 12/03/2012   PCP:  Redmond School, MD Pharmacy:   [id number] [place], [address] [place] [address] Phone: [phone] Fax: [phone]     Social Determinants of Health (SDOH) Interventions    Readmission Risk Interventions Readmission Risk Prevention Plan 04/28/2019 04/27/2019  Transportation Screening - Complete  Home Care Screening -  Complete  Medication Review (RN CM) - Complete  HRI or Home Care Consult Complete -  Social Work Consult for [place] Planning/Counseling Complete -  Palliative Care Screening Not Applicable -  Medication Review Press photographer) Complete -  Some recent data might be hidden

## 2019-09-03 NOTE — TOC Progression Note (Signed)
Transition of Care [facility]) - Progression Note    Patient Details  Name: Peter Lambert MRN: [id number] Date of Birth: 19-May-1945  [facility] CM/SW Contact  Loletha Grayer Beverely Pace, RN Phone Number: [phone] ( working remotely) 09/03/2019, 5:00 PM  Clinical Narrative:   Case manager faxed Notes, Demographics, H&P, Echo report to [facility] Vest [phone]. Informed her that it has not been determined life Vest vs ICD. TOC Team will continue to monitor.     Expected Discharge Plan: [place] Barriers to Discharge: Continued Medical Work up  Expected Discharge Plan and Services Expected Discharge Plan: [place] In-house Referral: NA Discharge Planning Services: CM Consult Post Acute Care Choice: [place] arrangements for the past 2 months: Single Family Home                           HH Arranged: PT, OT HH Agency: [place] Date [facility] Agency Contacted: 09/03/19 Time [place]: [id number] Representative spoke with at [place]: [place] Determinants of Health ([place]) Interventions    Readmission Risk Interventions Readmission Risk Prevention Plan 04/28/2019 04/27/2019  Transportation Screening - Complete  Home Care Screening - Complete  Medication Review (RN CM) - Complete  HRI or Home Care Consult Complete -  Social Work Consult for [place] Planning/Counseling Complete -  Palliative Care Screening Not Applicable -  Medication Review Press photographer) Complete -  Some recent data might be hidden

## 2019-09-03 NOTE — Progress Notes (Signed)
Advanced Heart Failure Rounding Note  PCP-Cardiologist: Quay Burow, MD   Subjective:    Admitted with VF arrest and LV dysfunction. Shocked by EMS x2.  Peak hstrop 3,900. EF initially 20%  Extubated 7/5.   Denies CP or SOB. Says he has to stop his bad habits. Creatinine improved.   Cath this am  Assessment: 1.Significant CAD with left dominant system 2. Separate ostia for LAD & LCx 3. LAD with widely patent proximal sten. Mild non-obstructive CAD 4. LCX large dominant vessel with moderate diffuse disease. Culprit lesion appears to be dissected OM-2 branch with 99% lesion 5. RCA small non-dominant that is totally occluded 6. LVEF recovered at 55% 7. Well-compensated hemodynamics.  Plan/Discussion:  Suspect culprit lesion is OM-2 will attempt PCI if amenable. Otherwise medical therapy. If unable to revascularize, consider LifeVest on d/c.    Objective:   Weight Range: 76.7 kg Body mass index is 25.73 kg/m.   Vital Signs:   Temp:  [97.6 F (36.4 C)-98.6 F (37 C)] 98.3 F (36.8 C) (07/08 0429) Pulse Rate:  [76-109] 109 (07/08 0429) Resp:  [16-18] 16 (07/08 0429) BP: (103-156)/(56-95) 123/95 (07/08 0429) SpO2:  [97 %-100 %] 100 % (07/08 0812) Last BM Date:  (pt unable to recall)  Weight change: Filed Weights   08/31/19 0500 09/01/19 0630 09/02/19 0349  Weight: 78.5 kg 76.8 kg 76.7 kg    Intake/Output:   Intake/Output Summary (Last 24 hours) at 09/03/2019 0926 Last data filed at 09/02/2019 1500 Gross per 24 hour  Intake 250 ml  Output 1300 ml  Net -1050 ml      Physical Exam   General:  Well appearing. No resp difficulty HEENT: normal Neck: supple. no JVD. Carotids 2+ bilat; no bruits. No lymphadenopathy or thryomegaly appreciated. Cor: PMI nondisplaced. Regular rate & rhythm. No rubs, gallops or murmurs. Lungs: clear Abdomen: soft, nontender, nondistended. No hepatosplenomegaly. No bruits or masses. Good bowel sounds. Extremities: no cyanosis,  clubbing, rash, edema Neuro: alert & orientedx3, cranial nerves grossly intact. moves all 4 extremities w/o difficulty. Affect pleasantt   Telemetry   SR 70-80s. PVCs suppressed  Personally reviewed   EKG    N/a   Labs    CBC Recent Labs    09/02/19 0325 09/03/19 0458  WBC 9.3 7.6  HGB 9.4* 9.9*  HCT 29.2* 32.0*  MCV 88.0 89.1  PLT 135* 161   Basic Metabolic Panel Recent Labs    08/31/19 1630 09/01/19 0028 09/01/19 0500 09/01/19 0500 09/02/19 0325 09/02/19 0325 09/02/19 1501 09/03/19 0458  NA  --    < > 139   < > 138   < > 139 140  K  --    < > 3.3*   < > 3.0*   < > 4.2 3.9  CL  --    < > 102   < > 101   < > 102 102  CO2  --    < > 25   < > 27   < > 27 29  GLUCOSE  --    < > 122*   < > 118*   < > 145* 108*  BUN  --    < > 48*   < > 35*   < > 27* 23  CREATININE  --    < > 2.12*   < > 1.71*   < > 1.47* 1.45*  CALCIUM  --    < > 8.4*   < > 8.3*   < >  8.5* 9.1  MG 2.1   < > 1.8   < > 2.1  --   --  2.1  PHOS 4.0  --  3.3  --   --   --   --   --    < > = values in this interval not displayed.   Liver Function Tests No results for input(s): AST, ALT, ALKPHOS, BILITOT, PROT, ALBUMIN in the last 72 hours. No results for input(s): LIPASE, AMYLASE in the last 72 hours. Cardiac Enzymes No results for input(s): CKTOTAL, CKMB, CKMBINDEX, TROPONINI in the last 72 hours.  BNP: BNP (last 3 results) Recent Labs    04/20/19 0846 04/23/19 2037 08/29/19 1819  BNP 580.0* 793.0* 202.2*    ProBNP (last 3 results) No results for input(s): PROBNP in the last 8760 hours.   D-Dimer No results for input(s): DDIMER in the last 72 hours. Hemoglobin A1C No results for input(s): HGBA1C in the last 72 hours. Fasting Lipid Panel No results for input(s): CHOL, HDL, LDLCALC, TRIG, CHOLHDL, LDLDIRECT in the last 72 hours. Thyroid Function Tests No results for input(s): TSH, T4TOTAL, T3FREE, THYROIDAB in the last 72 hours.  Invalid input(s): FREET3  Other results:    Imaging    CARDIAC CATHETERIZATION  Result Date: 09/03/2019  Ost RCA to Prox RCA lesion is 100% stenosed.  Previously placed Prox LAD to Mid LAD stent (unknown type) is widely patent.  1st Diag lesion is 90% stenosed.  Ost LAD lesion is 30% stenosed.  Prox Cx to Mid Cx lesion is 50% stenosed.  Ost Cx to Prox Cx lesion is 40% stenosed.  2nd Mrg lesion is 99% stenosed.  Findings: Ao = 116/58 (79) LV = 118/10 RA = 10 RV = 43/8 PA = 45/19 (26) PCW = 14 Fick cardiac output/index = 5.5/2.9 PVR = 2.2 Ao sat = 99% PA sat = 65%, 65% Assessment: 1.Significant CAD with left dominant system 2. Separate ostia for LAD & LCx 3. LAD with widely patent proximal sten. Mild non-obstructive CAD 4. LCX large dominant vessel with moderate diffuse disease. Culprit lesion appears to be dissected OM-2 branch with 99% lesion 5. RCA small non-dominant that is totally occluded 6. LVEF recovered at 55% 7. Well-compensated hemodynamics. Plan/Discussion: Suspect culprit lesion is OM-2 will attempt PCI if amenable. Otherwise medical therapy. If unable to revascularize, consider LifeVest on d/c. Glori Bickers, MD 9:25 AM     Medications:     Scheduled Medications: . [MAR Hold] acetaminophen  650 mg Oral Q4H while awake  . [MAR Hold] amiodarone  200 mg Oral BID  . [MAR Hold] aspirin  81 mg Per Tube Daily  . [MAR Hold] atorvastatin  80 mg Per Tube Daily  . [MAR Hold] carvedilol  3.125 mg Oral BID WC  . [MAR Hold] Chlorhexidine Gluconate Cloth  6 each Topical Daily  . [MAR Hold] docusate  100 mg Oral BID  . [MAR Hold] insulin aspart  0-9 Units Subcutaneous TID WC  . [MAR Hold] mouth rinse  15 mL Mouth Rinse BID  . [MAR Hold] pantoprazole  40 mg Oral QHS  . [MAR Hold] polyethylene glycol  17 g Oral Daily  . [MAR Hold] sodium chloride flush  10-40 mL Intracatheter Q12H  . [MAR Hold] sodium chloride flush  3 mL Intravenous Q12H  . [MAR Hold] spironolactone  50 mg Oral Daily    Infusions: . sodium chloride 250 mL  (09/03/19 0615)  . sodium chloride    . [MAR Hold] cefTRIAXone (ROCEPHIN)  IV 2 g (09/02/19 1133)  . heparin Stopped (09/03/19 0707)    PRN Medications: sodium chloride, [MAR Hold] albuterol, [MAR Hold] docusate sodium, fentaNYL, Heparin (Porcine) in NaCl, heparin sodium (porcine), heparin sodium (porcine), iohexol, lidocaine (PF), midazolam, Radial Cocktail/Verapamil only, [MAR Hold] sodium chloride flush, sodium chloride flush    Patient Profile  Peter Lambert a 74 y.o.with DM2, HTN, PAD s/p aorto-bifem graft, tobacco use and CAD s/p NSTEMI in April 2010. Cath showed revealed high-grade proximal LAD stenosis -> PCI 2.75 x 18 mm long Xience V drug-eluting stent. He had a dominant circumflex system with an 80% distal AV groove circumflex as well as a nondominant RCA. He had normal LV function. Abdominal aortogram revealed a patent aortobifemoral bypass graft.    Assessment/Plan   1. VF arrest - suspect ischemic in nature but based on ECG it is possible that he has PVC cardiomyopathy leading to primary VF event\ - Cath today with OM-2 with likely culprit lesion - EF now 20% -> 55% - Continue amio po 200 mg twice a day.  - If unable to PCI OM-2 consdier short-term LifeVest  2. NSTEMI with known CAD and LAD stent 4/10 - has h/o CAD - hstrop peaked at 3,894 - Cath as above.  - Will need DAPT  3. Acute systolic HF due to severe biventricular dysfunction - Echo 08/30/19 EF < 20% RV severely HKPersonally reviewed - Echo 2/21 EF 50-55% - EF improved post arrest 20% -> 55%  4. Acute hypoxic respiratory failure - due to #1 - Extubated 7/5. Sats stable on room air.   5. AKI on CKD 3b - baseline creatinine ~1.5-1.8 - Creatinine 2.3>2.1>1.7> 1.5  6. Bilateral aspiration PNA - PCT 3.97. On ceftriaxone  7. PAD - s/p aortobifem graft  8. Anemia  - hgs8stable 9.9  Consult cardiac rehab.    Length of Stay: [place], MD  09/03/2019, 9:26 AM  Advanced Heart  Failure Team Pager [phone] (M-F; 7a - 4p)  Please contact [facility] for night-coverage after hours (4p -7a ) and weekends on [email or web address]

## 2019-09-03 NOTE — Progress Notes (Signed)
ANTICOAGULATION CONSULT NOTE - Follow-Up Consult  Pharmacy Consult for heparin Indication: chest pain/ACS  No Known Allergies  Patient Measurements: Height: 5\' 8"  (172.7 cm) Weight: 76.7 kg (169 lb 3.2 oz) IBW/kg (Calculated) : 68.4 Heparin Dosing Weight: 78kg  Vital Signs: Temp: 98.3 F (36.8 C) (07/08 0429) Temp Source: Oral (07/08 0429) BP: 123/95 (07/08 0429) Pulse Rate: 109 (07/08 0429)  Labs: Recent Labs    08/31/19 0939 08/31/19 1256 08/31/19 1945 09/01/19 0500 09/01/19 0500 09/01/19 0639 09/01/19 1400 09/02/19 0325 09/02/19 0326 09/02/19 1501 09/03/19 0458  HGB  --   --   --  9.6*   < >  --   --  9.4*  --   --  9.9*  HCT  --   --   --  30.4*  --   --   --  29.2*  --   --  32.0*  PLT  --   --   --  126*  --   --   --  135*  --   --  167  HEPARINUNFRC  --   --    < >  --   --    < > 0.41  --  0.24*  --  0.21*  CREATININE  --   --    < > 2.12*  --   --   --  1.71*  --  1.47*  --   TROPONINIHS 837* 809*  --   --   --   --   --   --   --   --   --    < > = values in this interval not displayed.    Estimated Creatinine Clearance: 42 mL/min (A) (by C-G formula based on SCr of 1.47 mg/dL (H)).   Assessment: 38 yoM admitted s/p VFib arrest initially thought to have STEMI then cancelled. Pharmacy now asked to start heparin gtt with plans for ischemia evaluation.   Heparin level this a.m. remains subtherapeutic (0.21) on 850 units/hr. No bleeding or issues noted per RN. Plan for cath today -scheduled for 0900.   Goal of Therapy:  Heparin level 0.3-0.7 units/ml Monitor platelets by anticoagulation protocol: Yes   Plan:  -Increase heparin infusion to 950 units/hr  -Will f/u post cath  Thank you for allowing pharmacy to be a part of this patient's care.  Sherlon Handing, PharmD, BCPS Please see amion for complete clinical pharmacist phone list 09/03/2019 5:38 AM

## 2019-09-03 NOTE — Progress Notes (Signed)
Reached out to Darrick Grinder, NP about central line need and removal, states she will get back to me.

## 2019-09-03 NOTE — Consult Note (Addendum)
ELECTROPHYSIOLOGY CONSULT NOTE    Patient ID: Peter Lambert MRN: [id number], DOB/AGE: [birth year] 74 y.o.  Admit date: 08/29/2019 Date of Consult: 09/03/2019  Primary Physician: Redmond School, MD Primary Cardiologist: Quay Burow, MD  Electrophysiologist: New   Referring Provider: Dr. Haroldine Laws   Patient Profile: Peter Lambert is a 74 y.o. male with a history of DM2, HTN, PAD s/p aorto-bifem graft, CAD s/p NSTEM and stent in 05/2008, and COPD who is being seen today for the evaluation of aborted sudden cardiac death/VF arrest at the request of Dr. Haroldine Laws .  HPI:  Peter Lambert is a 74 y.o. male with medical history as above. He was standing with a friend after work (at [employer]) when he suddenly lost consciousness at around 4 pm. She did not do CPR, but called EMS, unclear downtime. EMS arrived and pt received shock for VF x 2 with 5 rounds of epi, amiodarone, bicarb, and eventual ROSC with estimated time of 45 minutes. Pt intubated. ECG on arrival showed RBBB with PVCs but no ST elevation. K low at 2.6. Bedside echo showed moderate biventricular dysfunction. PT cooled.   HS trop 3785 -> 3894 -> 837  Echo 08/30/2019 EF < 20% with RV severely HK  Pt underwent cath today, 09/03/2019, which demonstrated: 1.Significant CAD with left dominant system 2. Separate ostia for LAD & LCx 3. LAD with widely patent proximal sten. Mild non-obstructive CAD 4. LCX large dominant vessel with moderate diffuse disease. Culprit lesion appears to be dissected OM-2 branch with 99% lesion 5. RCA small non-dominant that is totally occluded 6. LVEF recovered at 55% 7. Well-compensated hemodynamics.  PCI was attempted but wire was not able to be passed through OM-2 branch lesion. No balloon. EF noted to be recovered as above on LV gram.   With inability to revascularize, EP asked to see for recommendations regarding ICD vs Lifevest.   He is feeling OK currently. He reports his USOH up to his  arrest. Denies working out in the heat or son. He takes nitroglycerin 2-3xs a week chronically for transient chest pain that quickly resolves with a dose. He denies any previous history of syncope or palpitations.   Past Medical History:  Diagnosis Date  . Coronary artery disease    a. s/p NSTEMI in 2010 with DES to proximal LAD with D1 jailed and angioplasty alone to ostium  . Hyperlipidemia   . Hypertension   . Hypertension   . Peripheral arterial disease ([place])   . Tobacco abuse   . Type 2 diabetes mellitus [facility])      Surgical History:  Past Surgical History:  Procedure Laterality Date  . CARDIAC SURGERY    . CORONARY STENT INTERVENTION N/A 09/03/2019   Procedure: CORONARY STENT INTERVENTION;  Surgeon: Burnell Blanks, MD;  Location: [place];  Service: Cardiovascular;  Laterality: N/A;  . CORONARY STENT PLACEMENT    . RIGHT/LEFT HEART CATH AND CORONARY ANGIOGRAPHY N/A 09/03/2019   Procedure: RIGHT/LEFT HEART CATH AND CORONARY ANGIOGRAPHY;  Surgeon: Jolaine Artist, MD;  Location: [place] [facility];  Service: Cardiovascular;  Laterality: N/A;     Medications Prior to Admission  Medication Sig Dispense Refill Last Dose  . acetaminophen (TYLENOL) 325 MG tablet Take 2 tablets (650 mg total) by mouth every 6 (six) hours as needed for mild pain, fever or headache (or Fever >/= 101). 30 tablet 0 unknown at unknown  . albuterol (VENTOLIN HFA) 108 (90 Base) MCG/ACT inhaler Inhale 2  puffs into the lungs every 4 (four) hours as needed for wheezing or shortness of breath. 18 g 3 unknown at unknown  . aspirin EC 81 MG tablet Take 1 tablet (81 mg total) by mouth daily with breakfast. 30 tablet 2 unknown at unknown  . colchicine 0.6 MG tablet Take 0.6 mg by mouth daily.    unknown at unknown  . doxazosin (CARDURA) 2 MG tablet Take 2 mg by mouth daily.   unknown at unknown  . furosemide (LASIX) 20 MG tablet Take 1 tablet (20 mg total) by mouth 2 (two) times daily. (Patient  taking differently: Take 60 mg by mouth daily. ) 60 tablet 3 unknown at unknown  . metolazone (ZAROXOLYN) 2.5 MG tablet Take 1 tablet (2.5 mg total) by mouth See admin instructions. Take only on Monday and Friday mornings for fluid and heart (Patient taking differently: Take 2.5 mg by mouth daily. ) 10 tablet 3 unknown at unknown  . nitroGLYCERIN (NITROSTAT) 0.4 MG SL tablet PLACE 1 TABLET UNDER THE TONGUE EVERY 5 MINUTES AS NEEDED FOR CHEST PAIN (Patient taking differently: Place 0.4 mg under the tongue every 5 (five) minutes as needed for chest pain. ) 75 tablet 3 unknown at unknown  . polyethylene glycol (MIRALAX / GLYCOLAX) 17 g packet Take 17 g by mouth daily. For bowel (Patient taking differently: Take 17 g by mouth daily as needed for mild constipation. ) 30 each 5 unknown at unknown  . potassium chloride SA (KLOR-CON M20) 20 MEQ tablet Take 1 tablet (20 mEq total) by mouth daily. 30 tablet 2 unknown at unknown  . atorvastatin (LIPITOR) 40 MG tablet Take 1 tablet (40 mg total) by mouth daily. (Patient not taking: Reported on 08/29/2019) 90 tablet 3 Not Taking at Unknown time  . clopidogrel (PLAVIX) 75 MG tablet Take 1 tablet (75 mg total) by mouth daily. For heart (Patient not taking: Reported on 08/29/2019) 30 tablet 3 Not Taking at Unknown time  . ferrous sulfate 325 (65 FE) MG tablet Take 1 tablet (325 mg total) by mouth 3 (three) times daily. (Patient not taking: Reported on 08/29/2019) 90 tablet 11 Not Taking at Unknown time  . guaiFENesin (MUCINEX) 600 MG 12 hr tablet Take 1 tablet (600 mg total) by mouth 2 (two) times daily. (Patient not taking: Reported on 08/29/2019) 20 tablet 0 Not Taking at Unknown time  . metoprolol tartrate (LOPRESSOR) 25 MG tablet Take 0.5 tablets (12.5 mg total) by mouth 2 (two) times daily. For heart (Patient not taking: Reported on 08/29/2019) 30 tablet 2 Not Taking at Unknown time    Inpatient Medications:  . acetaminophen  650 mg Oral Q4H while awake  . amiodarone  200  mg Oral BID  . aspirin  81 mg Per Tube Daily  . atorvastatin  80 mg Per Tube Daily  . carvedilol  3.125 mg Oral BID WC  . Chlorhexidine Gluconate Cloth  6 each Topical Daily  . [START ON 09/04/2019] clopidogrel  75 mg Oral Q breakfast  . docusate  100 mg Oral BID  . [START ON 09/04/2019] enoxaparin (LOVENOX) injection  40 mg Subcutaneous Q24H  . insulin aspart  0-9 Units Subcutaneous TID WC  . mouth rinse  15 mL Mouth Rinse BID  . pantoprazole  40 mg Oral QHS  . polyethylene glycol  17 g Oral Daily  . sodium chloride flush  10-40 mL Intracatheter Q12H  . sodium chloride flush  3 mL Intravenous Q12H  . sodium chloride flush  3  mL Intravenous Q12H  . spironolactone  50 mg Oral Daily    Allergies: No Known Allergies  Social History   Socioeconomic History  . Marital status: Divorced    Spouse name: Not on file  . Number of children: Not on file  . Years of education: Not on file  . Highest education level: Not on file  Occupational History  . Not on file  Tobacco Use  . Smoking status: Former Smoker    Packs/day: 0.50    Years: 52.00    Pack years: 26.00    Types: Cigarettes  . Smokeless tobacco: Never Used  Substance and Sexual Activity  . Alcohol use: No  . Drug use: No  . Sexual activity: Never  Other Topics Concern  . Not on file  Social History Narrative  . Not on file   Social Determinants of Health   Financial Resource Strain:   . Difficulty of Paying Living Expenses:   Food Insecurity:   . Worried About Charity fundraiser in the Last Year:   . Arboriculturist in the Last Year:   Transportation Needs:   . Film/video editor (Medical):   Marland Kitchen Lack of Transportation (Non-Medical):   Physical Activity:   . Days of Exercise per Week:   . Minutes of Exercise per Session:   Stress:   . Feeling of Stress :   Social Connections:   . Frequency of Communication with Friends and Family:   . Frequency of Social Gatherings with Friends and Family:   . Attends  Religious Services:   . Active Member of Clubs or Organizations:   . Attends Archivist Meetings:   Marland Kitchen Marital Status:   Intimate Partner Violence:   . Fear of Current or Ex-Partner:   . Emotionally Abused:   Marland Kitchen Physically Abused:   . Sexually Abused:      Family History  Problem Relation Age of Onset  . Cancer Mother      Review of Systems: All other systems reviewed and are otherwise negative except as noted above.  Physical Exam: Vitals:   09/03/19 1014 09/03/19 1020 09/03/19 1032 09/03/19 1500  BP: (!) 138/98  (!) 153/87 134/69  Pulse: 80  86 79  Resp:      Temp:  98.8 F (37.1 C)    TempSrc:  Oral    SpO2: 100%  100% 100%  Weight:      Height:        GEN- The patient is well appearing, alert and oriented x 3 today.   HEENT: normocephalic, atraumatic; sclera clear, conjunctiva pink; hearing intact; oropharynx clear; neck supple Lungs- Clear to ausculation bilaterally, normal work of breathing.  No wheezes, rales, rhonchi Heart- Regular rate and rhythm, no murmurs, rubs or gallops GI- soft, non-tender, non-distended, bowel sounds present Extremities- no clubbing, cyanosis, or edema; DP/PT/radial pulses 2+ bilaterally MS- no significant deformity or atrophy Skin- warm and dry, no rash or lesion Psych- euthymic mood, full affect Neuro- strength and sensation are intact  Labs:   Lab Results  Component Value Date   WBC 7.4 09/03/2019   HGB 9.4 (L) 09/03/2019   HCT 30.4 (L) 09/03/2019   MCV 89.4 09/03/2019   PLT 149 (L) 09/03/2019    Recent Labs  Lab 08/30/19 1006 08/31/19 0353 09/03/19 0458 09/03/19 0458 09/03/19 1040  NA 141   < > 140  --   --   K 3.3*   < > 3.9  --   --  CL 107   < > 102  --   --   CO2 23   < > 29  --   --   BUN 28*   < > 23  --   --   CREATININE 1.87*   < > 1.45*   < > 1.32*  CALCIUM 7.6*   < > 9.1  --   --   PROT 5.9*  --   --   --   --   BILITOT 0.8  --   --   --   --   ALKPHOS 54  --   --   --   --   ALT 50*  --    --   --   --   AST 66*  --   --   --   --   GLUCOSE 153*   < > 108*  --   --    < > = values in this interval not displayed.      Radiology/Studies: CT Head Wo Contrast  Result Date: 08/29/2019 CLINICAL DATA:  Post cardiac arrest. EXAM: CT HEAD WITHOUT CONTRAST CT CERVICAL SPINE WITHOUT CONTRAST TECHNIQUE: Multidetector CT imaging of the head and cervical spine was performed following the standard protocol without intravenous contrast. Multiplanar CT image reconstructions of the cervical spine were also generated. COMPARISON:  None FINDINGS: CT HEAD FINDINGS Brain: No evidence of acute infarction, hemorrhage, hydrocephalus, extra-axial collection or mass lesion/mass effect. Subtle focus of added density in the LEFT basal ganglia likely asymmetric calcification is. Mild white matter disease. Vascular: No hyperdense vessel or unexpected calcification. Skull: Normal. Negative for fracture or focal lesion. Sinuses/Orbits: Mild mucosal thickening of ethmoid sinuses. Sinuses are incompletely imaged. Small amount of fluid in the posterior ethmoids likely related to indwelling endotracheal tube. Other: None. CT CERVICAL SPINE FINDINGS Alignment: Normal.  Head is turned to the RIGHT. Skull base and vertebrae: No acute fracture. No primary bone lesion or focal pathologic process. Soft tissues and spinal canal: Endotracheal tube in place. Gastric tube in place, tubes are incompletely imaged. Prevertebral soft tissues with limited assessment due to indwelling tubes. Disc levels: Multilevel degenerative changes greatest at C4-5 C5-6 and C6-7 with small anterior osteophytes. Upper chest: Scarring in the upper chest at the RIGHT lung apex. Extensive areas of parenchymal opacity were seen bilaterally in the chest on previous chest CT. Other: None IMPRESSION: 1. No acute intracranial abnormality. 2. No acute spine fracture. 3. Multilevel degenerative changes of the cervical spine. 4. Apical scarring in the chest in the RIGHT  upper lobe in this patient with diffuse parenchymal disease on previous chest CT. 5. Aortic atherosclerosis. Aortic Atherosclerosis (ICD10-I70.0). Electronically Signed   By: Zetta Bills M.D.   On: 08/29/2019 20:59   CT Cervical Spine Wo Contrast  Result Date: 08/29/2019 CLINICAL DATA:  Post cardiac arrest. EXAM: CT HEAD WITHOUT CONTRAST CT CERVICAL SPINE WITHOUT CONTRAST TECHNIQUE: Multidetector CT imaging of the head and cervical spine was performed following the standard protocol without intravenous contrast. Multiplanar CT image reconstructions of the cervical spine were also generated. COMPARISON:  None FINDINGS: CT HEAD FINDINGS Brain: No evidence of acute infarction, hemorrhage, hydrocephalus, extra-axial collection or mass lesion/mass effect. Subtle focus of added density in the LEFT basal ganglia likely asymmetric calcification is. Mild white matter disease. Vascular: No hyperdense vessel or unexpected calcification. Skull: Normal. Negative for fracture or focal lesion. Sinuses/Orbits: Mild mucosal thickening of ethmoid sinuses. Sinuses are incompletely imaged. Small amount of fluid in the posterior  ethmoids likely related to indwelling endotracheal tube. Other: None. CT CERVICAL SPINE FINDINGS Alignment: Normal.  Head is turned to the RIGHT. Skull base and vertebrae: No acute fracture. No primary bone lesion or focal pathologic process. Soft tissues and spinal canal: Endotracheal tube in place. Gastric tube in place, tubes are incompletely imaged. Prevertebral soft tissues with limited assessment due to indwelling tubes. Disc levels: Multilevel degenerative changes greatest at C4-5 C5-6 and C6-7 with small anterior osteophytes. Upper chest: Scarring in the upper chest at the RIGHT lung apex. Extensive areas of parenchymal opacity were seen bilaterally in the chest on previous chest CT. Other: None IMPRESSION: 1. No acute intracranial abnormality. 2. No acute spine fracture. 3. Multilevel degenerative  changes of the cervical spine. 4. Apical scarring in the chest in the RIGHT upper lobe in this patient with diffuse parenchymal disease on previous chest CT. 5. Aortic atherosclerosis. Aortic Atherosclerosis (ICD10-I70.0). Electronically Signed   By: Zetta Bills M.D.   On: 08/29/2019 20:59   CARDIAC CATHETERIZATION  Result Date: 09/03/2019  2nd Mrg lesion is 99% stenosed.  Please see Dr. Clayborne Dana full cath report for details of the diagnostic catheterization. Unsuccessful attempt at PCI of the occluded OM branch. I was unable to cross the lesion with a wire. No balloon angioplasty performed. Discussed case with Dr. Haroldine Laws. Will load with Plavix and continue ASA.   CARDIAC CATHETERIZATION  Result Date: 09/03/2019  Ost RCA to Prox RCA lesion is 100% stenosed.  Previously placed Prox LAD to Mid LAD stent (unknown type) is widely patent.  1st Diag lesion is 90% stenosed.  Ost LAD lesion is 30% stenosed.  Prox Cx to Mid Cx lesion is 50% stenosed.  Ost Cx to Prox Cx lesion is 40% stenosed.  2nd Mrg lesion is 99% stenosed.  Findings: Ao = 116/58 (79) LV = 118/10 RA = 10 RV = 43/8 PA = 45/19 (26) PCW = 14 Fick cardiac output/index = 5.5/2.9 PVR = 2.2 Ao sat = 99% PA sat = 65%, 65% Assessment: 1.Significant CAD with left dominant system 2. Separate ostia for LAD & LCx 3. LAD with widely patent proximal sten. Mild non-obstructive CAD 4. LCX large dominant vessel with moderate diffuse disease. Culprit lesion appears to be dissected OM-2 branch with 99% lesion 5. RCA small non-dominant that is totally occluded 6. LVEF recovered at 55% 7. Well-compensated hemodynamics. Plan/Discussion: Suspect culprit lesion is OM-2 will attempt PCI if amenable. Otherwise medical therapy. If unable to revascularize, consider LifeVest on d/c. Glori Bickers, MD 9:25 AM   DG CHEST PORT 1 VIEW  Result Date: 09/01/2019 CLINICAL DATA:  Acute respiratory failure.  Hypoxia. EXAM: PORTABLE CHEST 1 VIEW COMPARISON:   08/29/2019.  CT 09/17/2019. FINDINGS: Interim extubation and removal of NG tube. Left IJ line stable position. Heart size stable. Stable bilateral interstitial prominence. Low lung volumes with bibasilar atelectasis. No prominent pleural effusion or pneumothorax. No acute bony abnormality identified. IMPRESSION: 1. Interim extubation removal of NG tube. Left IJ line stable position. 2. Stable bilateral interstitial prominence. Low lung volumes with bibasilar atelectasis. No prominent pleural effusion or pneumothorax. Electronically Signed   By: Marcello Moores  Register   On: 09/01/2019 06:26   DG Chest Portable 1 View  Result Date: 08/29/2019 CLINICAL DATA:  Central line placement. EXAM: PORTABLE CHEST 1 VIEW COMPARISON:  August 29, 2019 FINDINGS: A left internal jugular venous catheter is seen with its distal tip noted near the junction of the superior vena cava and right atrium. Stable endotracheal tube and  nasogastric tube positioning is noted. Mild stable bilateral airspace disease is seen. The heart size and mediastinal contours are within normal limits. The visualized skeletal structures are unremarkable. IMPRESSION: Interval left internal jugular venous catheter placement positioning, as described above, when compared to the prior study. Electronically Signed   By: Virgina Norfolk M.D.   On: 08/29/2019 22:47   DG Chest Portable 1 View  Result Date: 08/29/2019 CLINICAL DATA:  Patient status post intubation and OG tube placement. EXAM: PORTABLE CHEST 1 VIEW COMPARISON:  Single-view of the chest 04/28/2019 and PA and lateral chest 09/09/2010. CT chest 04/20/2019. FINDINGS: Endotracheal tube is in place with the tip 3.3 cm above the carina. OG tube tip is seen in the fundus of the stomach. There is mild cardiomegaly. Atherosclerosis is seen. Patchy airspace disease is seen in the right upper lung zone and throughout the left chest. No pneumothorax or pleural fluid. No acute or focal bony abnormality. IMPRESSION: OG  tube and ET tube in good position. Left worse than right patchy airspace disease most worrisome for pneumonia. Electronically Signed   By: Inge Rise M.D.   On: 08/29/2019 18:30   ECHOCARDIOGRAM COMPLETE  Result Date: 08/30/2019    ECHOCARDIOGRAM REPORT   Patient Name:   GREGORIO WORLEY Date of Exam: 08/30/2019 Medical Rec #:  [id number]       Height:       68.0 in Accession #:    [id number]      Weight:       167.3 lb Date of Birth:  06-Sep-1945      BSA:          1.895 m Patient Age:    24 years        BP:           81/76 mmHg Patient Gender: M               HR:           64 bpm. Exam Location:  Inpatient Procedure: 2D Echo, Cardiac Doppler, Color Doppler and Intracardiac            Opacification Agent Indications:    Cardiac arrest  History:        Patient has prior history of Echocardiogram examinations, most                 recent 04/20/2019. CHF, CAD, COPD, Signs/Symptoms:Dyspnea; Risk                 Factors:Hypertension, Current Smoker and Dyslipidemia.  Sonographer:    Roseanna Rainbow RDCS Referring Phys: [id number] Francesca Jewett  Sonographer Comments: Technically difficult study due to poor echo windows and echo performed with patient supine and on artificial respirator. Image acquisition challenging due to respiratory motion. IMPRESSIONS  1. Left ventricular ejection fraction, by estimation, is 20%. The left ventricle has severely decreased function. The left ventricle demonstrates global hypokinesis, more prominent inferior/posterior walls. There is moderate left ventricular hypertrophy. Left ventricular diastolic parameters are consistent with Grade II diastolic dysfunction (pseudonormalization). Slow flow and stasis noted by Definity contrast in LV particularly at apex. Although no formed thrombus, there is significant substrate for thrombus formation.  2. RV-RA gradient 25 mmHg. Right ventricular systolic function is severely reduced. The right ventricular size is normal.  3. The mitral valve is grossly  normal, mildly thickened. Mild mitral valve regurgitation.  4. The aortic valve is tricuspid. Aortic valve regurgitation is not visualized. Mild aortic valve sclerosis is present,  with no evidence of aortic valve stenosis.  5. Unable to estimate CVP. FINDINGS  Left Ventricle: Left ventricular ejection fraction, by estimation, is 20%. The left ventricle has severely decreased function. The left ventricle demonstrates global hypokinesis. Definity contrast agent was given IV to delineate the left ventricular endocardial borders. The left ventricular internal cavity size was normal in size. There is moderate left ventricular hypertrophy. Left ventricular diastolic parameters are consistent with Grade II diastolic dysfunction (pseudonormalization). Right Ventricle: RV-RA gradient 25 mmHg. The right ventricular size is normal. No increase in right ventricular wall thickness. Right ventricular systolic function is severely reduced. Left Atrium: Left atrial size was normal in size. Right Atrium: Right atrial size was normal in size. Pericardium: There is no evidence of pericardial effusion. Mitral Valve: The mitral valve is grossly normal. There is mild thickening of the mitral valve leaflet(s). Mild mitral valve regurgitation. Tricuspid Valve: The tricuspid valve is grossly normal. Tricuspid valve regurgitation is trivial. Aortic Valve: The aortic valve is tricuspid. Aortic valve regurgitation is not visualized. Mild aortic valve sclerosis is present, with no evidence of aortic valve stenosis. Mild aortic valve annular calcification. Pulmonic Valve: The pulmonic valve was grossly normal. Pulmonic valve regurgitation is trivial. Aorta: The aortic root is normal in size and structure. Venous: Unable to estimate CVP. IVC assessment for right atrial pressure unable to be performed due to mechanical ventilation. IAS/Shunts: No atrial level shunt detected by color flow Doppler.  LEFT VENTRICLE PLAX 2D LVIDd:         4.80 cm      Diastology LVIDs:         4.40 cm     LV e' lateral:   2.64 cm/s LV PW:         1.50 cm     LV E/e' lateral: 14.7 LV IVS:        1.40 cm     LV e' medial:    2.87 cm/s LVOT diam:     2.20 cm     LV E/e' medial:  13.6 LV SV:         23 LV SV Index:   12 LVOT Area:     3.80 cm  LV Volumes (MOD) LV vol d, MOD A2C: 93.5 ml LV vol d, MOD A4C: 95.8 ml LV vol s, MOD A2C: 68.4 ml LV vol s, MOD A4C: 71.8 ml LV SV MOD A2C:     25.1 ml LV SV MOD A4C:     95.8 ml LV SV MOD BP:      23.8 ml RIGHT VENTRICLE            IVC RV S prime:     4.73 cm/s  IVC diam: 1.90 cm TAPSE (M-mode): 1.0 cm LEFT ATRIUM           Index       RIGHT ATRIUM           Index LA diam:      3.90 cm 2.06 cm/m  RA Area:     16.70 cm LA Vol (A2C): 27.0 ml 14.25 ml/m RA Volume:   44.20 ml  23.33 ml/m LA Vol (A4C): 25.6 ml 13.51 ml/m  AORTIC VALVE LVOT Vmax:   40.70 cm/s LVOT Vmean:  25.800 cm/s LVOT VTI:    0.060 m  AORTA Ao Root diam: 3.60 cm MITRAL VALVE               TRICUSPID VALVE MV Area (PHT): 2.62 cm  TR Peak grad:   25.2 mmHg MV Decel Time: 289 msec    TR Vmax:        251.00 cm/s MV E velocity: 38.90 cm/s MV A velocity: 42.10 cm/s  SHUNTS MV E/A ratio:  0.92        Systemic VTI:  0.06 m                            Systemic Diam: 2.20 cm Rozann Lesches MD Electronically signed by Rozann Lesches MD Signature Date/Time: 08/30/2019/4:40:40 PM    Final     EKG: today shows NSR at 81 bpm, PR interval 126 ms with QRS of 116 ms (personally reviewed) EKG on arrival showed NSR 94 bpm with PVCs and RBBB with QRS 169 ms  TELEMETRY: NSR 80s with PVCs and occasional NSVT up to 10-12 beats (personally reviewed)  Assessment/Plan: 1. VF arrest/Aborted sudden cardiac death - Thought to be ischemic in setting of OM-2 as likely lesion. Failed PCI, plan medical therapy - EF 20% on arrival, improved to 55% on LV gram today.  - Pt remains on amiodarone 200 mg BID.  Continue coreg 3.125 mg BID. Titrate as tolerated We reviewed no driving x 6 months As  he does not have a reversible cause (suspect electrolyte imbalance related to shift post arrest) would recommend ICD implantation for secondary prevention. Explained risks, benefits, and alternatives to ICD implantation, including but not limited to bleeding, infection, pneumothorax, pericardial effusion, lead dislodgement, heart attack, stroke, or death.  Pt verbalized understanding and agrees to proceed if indicated.  2. NSTEMI with known CAD and LAD stent 4/10 - HStrop peaked at 13  - Cath as above.  - Will need DAPT.  3. Acute systolic HF due to severe biventricular dysfunction - Echo 08/30/19 EF < 20% RV severely HKPersonally reviewed - Echo 2/21 EF 50-55% - EF improved post arrest 20% -> 55% - HF team following  4. Hypokalemia/Hypomagnesemia K 2.6 and Mg 1.4 on arrival.  Stable today.  Goal K > 3.9 and Mg >1.9.  5. AKI on CKD 3b - In setting of arrest.  - Creatinine down to 1.32 today. Peaked at 2.3 this admit.   6. Bilateral aspiration PNA - PCT 3.97. On ceftriaxone - Per primary  7. PAD - s/p aortobifem graft  8. Anemia  - Hgb 9.4 today.   For questions or updates, please contact [place] Please consult [email or web address] for contact info under Cardiology/STEMI.  Signed, Shirley Friar, PA-C  09/03/2019 3:21 PM   I have seen, examined the patient, and reviewed the above assessment and plan.  Changes to above are made where necessary.  On exam, RRR.  The patient had VF arrest and was successfully defibrillated.  He has made good recovery.  He has known CAD but no lesions amenable to revascularization.  Dr Lovena Le and I have reviewed together and feel that initial electrolyte abnormality was secondary to the arrest and not the primary cause.  He had mild troponin elevation secondary to arrest but did not have a primary embolic event or MI. As there is no reversible cause, I would advise ICD.  The patient and his son are aware of risks and benefits to ICD.   Through a shared making decision makiing process together, we have decided to proceed with ICD at this time.   Risks, benefits, alternatives to ICD implantation were discussed in detail with the patient and his son today. The  patient understands that the risks include but are not limited to bleeding, infection, pneumothorax, perforation, tamponade, vascular damage, renal failure, MI, stroke, death, inappropriate shocks, and lead dislodgement and wishes to proceed.  We will therefore schedule device implantation tomorrow with Dr Lovena Le.  No driving x 6 months post arrest (pt aware)  Co Sign: Thompson Grayer, MD

## 2019-09-03 NOTE — Progress Notes (Signed)
Discussed with Dr Haroldine Laws central line.   Ok to remove central line given he has saline lock.   Order placed.    Abie Cheek NP-C  11:37 AM

## 2019-09-03 NOTE — Progress Notes (Signed)
[facility]  PROGRESS NOTE    Peter Lambert  BUL:[id number] DOB: 20-Apr-1945 DOA: 08/29/2019 PCP: Redmond School, MD   Brief Narrative:   74 year old man with hx of CAD, COPD, admitted 7/3 after out of hospital Vfib cardiac arrest with shock x2, epi x5, amio and bicarb before ROSC, suspected sepsis/CAP.  Unknown downtime, did not receive out of hospital CPR.   7/8: PCCM pickup. S/p RHC/LHC. Dissected OM-2 branch with 99% lesion noted on exam. EP consulted for ICD vs life vest.     Assessment & Plan:   Principal Problem:   Cardiac arrest with ventricular fibrillation ([place]) Active Problems:   Peripheral arterial disease ([place])   Coronary artery disease s/p DES to LAD in 2010, s/p PCI to proximal LAD stenosis with DES this hospitalization   Essential hypertension   Hyperlipidemia   Acute respiratory failure with hypoxemia ([place])   Aspiration pneumonia ([place]), bilateral   Demand ischemia of myocardium (HCC)   AKI (acute kidney injury) ([place])   Hypokalemia   Hyperglycemia   Hypoalbuminemia   Elevated brain natriuretic peptide (BNP) level   Lactic acidosis   Leukocytosis   Proteinuria   Hypomagnesemia   NSTEMI (non-ST elevated myocardial infarction) ([place])   CKD (chronic kidney disease), stage III b   PVC's (premature ventricular contractions)   NSVT (nonsustained ventricular tachycardia) (HCC)   Thrombocytopenia (HCC)   Anemia of infection and chronic disease   Severe sepsis with septic shock ([place]) suspected on admission, source: ? aspiration   Acute metabolic encephalopathy   Acute systolic CHF (congestive heart failure) (HCC)   Anticoagulated on heparin   History of noncompliance with medical treatment  VF Cardiac Arrest Acute Systolic Heart Failure      - s/p prolonged out of hospital cardiac arrest.  Unknown downtime. He was apparently asymptomatic prior to arrest, though exact details unclear.  PNA/CAP and hypokalemia likely contributing.  UDS negative. New reduction of LVEF (prior  normal).      - 7/8: continue amiodarone, coreg, spironolactone     - cards onboard, now s/p RHC/LHC; Dissected OM-2 branch with 99% lesion noted on exam. EP consulted for ICD vs life vest.  Acute Hypoxic Respiratory Failure      - In the setting of V-fib arrest and CAP. RVP negative.      - wean O2 for sats >90%     - 7/8: continue rocephin to complete 5 days  Acute Metabolic Encephalopathy      - Resolved  AKI on CKD III Hypokalemia     - SCr improved to 1.32; K+ stable  Normocytic Anemia  Thrombocytopenia      - 7/8: Plts 149; no evidence of bleed, Hgb 9.4. Follow  COPD Tobacco Abuse     - PRN albuterol      - 7/8 counseled against further tobacco use  DM II  Non-compliant / poorly controlled      - 7/8: continue SSI  DVT prophylaxis: lovenox Code Status: FULL Family Communication: Spoke with son at bedside   Status is: Inpatient  Remains inpatient appropriate because:Ongoing diagnostic testing needed not appropriate for outpatient work up   Dispo: The patient is from: Home              Anticipated d/c is to: SNF              Anticipated d/c date is: 3 days              Patient currently is  not medically stable to d/c.  Consultants:   PCCM  Cardiology  Procedures:   RHC/LHC  Antimicrobials:  . rocephin   ROS:  Denies CP, N, V, ab pain . Remainder 10-pt ROS is negative for all not previously mentioned.  Subjective: "I'll go if you think I should."  Objective: Vitals:   09/03/19 1020 09/03/19 1032 09/03/19 1500 09/03/19 1622  BP:  (!) 153/87 134/69 134/82  Pulse:  86 79 80  Resp:    20  Temp: 98.8 F (37.1 C)   98.6 F (37 C)  TempSrc: Oral   Oral  SpO2:  100% 100% 99%  Weight:      Height:        Intake/Output Summary (Last 24 hours) at 09/03/2019 1826 Last data filed at 09/03/2019 1500 Gross per 24 hour  Intake 303.28 ml  Output 800 ml  Net -496.72 ml   Filed Weights   08/31/19 0500 09/01/19 0630 09/02/19 0349  Weight: 78.5 kg  76.8 kg 76.7 kg    Examination:  General: 74 y.o. male resting in chair in NAD Cardiovascular: RRR, +S1, S2, no m/g/r, equal pulses throughout Respiratory: CTABL, no w/r/r, normal WOB GI: BS+, NDNT, no masses noted, no organomegaly noted MSK: No e/c/c Neuro: Alert to name, follows commands Psyc: Appropriate interaction and affect, calm/cooperative   Data Reviewed: I have personally reviewed following labs and imaging studies.  CBC: Recent Labs  Lab 08/29/19 1819 08/29/19 1907 08/31/19 0353 08/31/19 0353 09/01/19 0500 09/01/19 0500 09/02/19 0325 09/03/19 0458 09/03/19 0843 09/03/19 0848 09/03/19 1040  WBC 24.3*   < > 8.9  --  9.0  --  9.3 7.6  --   --  7.4  NEUTROABS 16.8*  --   --   --   --   --   --   --   --   --   --   HGB 12.0*   < > 10.5*   < > 9.6*   < > 9.4* 9.9* 10.2* 10.2*  10.2* 9.4*  HCT 39.0   < > 33.4*   < > 30.4*   < > 29.2* 32.0* 30.0* 30.0*  30.0* 30.4*  MCV 90.9   < > 88.8  --  87.9  --  88.0 89.1  --   --  89.4  PLT 218   < > 126*  --  126*  --  135* 167  --   --  149*   < > = values in this interval not displayed.   Basic Metabolic Panel: Recent Labs  Lab 08/31/19 0353 08/31/19 0939 08/31/19 0939 08/31/19 1630 09/01/19 0028 09/01/19 0028 09/01/19 0500 09/01/19 0500 09/02/19 0325 09/02/19 1501 09/03/19 0458 09/03/19 0843 09/03/19 0848 09/03/19 1040  NA   < >  --   --   --  137   < > 139   < > 138 139 140 144 141  137  --   K   < >  --   --   --  3.0*   < > 3.3*   < > 3.0* 4.2 3.9 3.7 3.9  3.5  --   CL   < >  --   --   --  100  --  102  --  101 102 102  --   --   --   CO2   < >  --   --   --  27  --  25  --  27 27 29   --   --   --  GLUCOSE   < >  --   --   --  123*  --  122*  --  118* 145* 108*  --   --   --   BUN   < >  --   --   --  48*  --  48*  --  35* 27* 23  --   --   --   CREATININE   < >  --   --   --  2.25*   < > 2.12*  --  1.71* 1.47* 1.45*  --   --  1.32*  CALCIUM   < >  --   --   --  8.3*  --  8.4*  --  8.3* 8.5* 9.1  --    --   --   MG  --  2.2   < > 2.1 2.1  --  1.8  --  2.1  --  2.1  --   --   --   PHOS  --  3.7  --  4.0  --   --  3.3  --   --   --   --   --   --   --    < > = values in this interval not displayed.   GFR: Estimated Creatinine Clearance: 46.8 mL/min (A) (by C-G formula based on SCr of 1.32 mg/dL (H)). Liver Function Tests: Recent Labs  Lab 08/29/19 1819 08/29/19 2231 08/30/19 1006  AST 86* 91* 66*  ALT 64* 59* 50*  ALKPHOS 82 71 54  BILITOT 0.7 1.6* 0.8  PROT 6.7 6.4* 5.9*  ALBUMIN 3.4* 3.3* 2.9*   No results for input(s): LIPASE, AMYLASE in the last 168 hours. No results for input(s): AMMONIA in the last 168 hours. Coagulation Profile: Recent Labs  Lab 08/29/19 1819  INR 1.2   Cardiac Enzymes: No results for input(s): CKTOTAL, CKMB, CKMBINDEX, TROPONINI in the last 168 hours. BNP (last 3 results) No results for input(s): PROBNP in the last 8760 hours. HbA1C: No results for input(s): HGBA1C in the last 72 hours. CBG: Recent Labs  Lab 09/02/19 1302 09/02/19 1619 09/02/19 2103 09/03/19 1208 09/03/19 1608  GLUCAP 120* 115* 157* 139* 119*   Lipid Profile: No results for input(s): CHOL, HDL, LDLCALC, TRIG, CHOLHDL, LDLDIRECT in the last 72 hours. Thyroid Function Tests: No results for input(s): TSH, T4TOTAL, FREET4, T3FREE, THYROIDAB in the last 72 hours. Anemia Panel: Recent Labs    09/02/19 1122  VITAMINB12 147*  FOLATE 5.2*  FERRITIN 235  TIBC 288  IRON 19*  RETICCTPCT 1.5   Sepsis Labs: Recent Labs  Lab 08/29/19 1819 08/29/19 2231 08/29/19 2232 08/30/19 0428 08/30/19 1005 08/31/19 0353  PROCALCITON  --  0.63  --  3.36  --  3.97  LATICACIDVEN 10.2*  --  6.6* 3.3* 1.0  --     Recent Results (from the past 240 hour(s))  SARS Coronavirus 2 by RT PCR (hospital order, performed in [facility] hospital lab) Nasopharyngeal Nasopharyngeal Swab     Status: None   Collection Time: 08/29/19  6:30 PM   Specimen: Nasopharyngeal Swab  Result Value Ref Range  Status   SARS Coronavirus 2 NEGATIVE NEGATIVE Final    Comment: (NOTE) SARS-CoV-2 target nucleic acids are NOT DETECTED.  The SARS-CoV-2 RNA is generally detectable in upper and lower respiratory specimens during the acute phase of infection. The lowest concentration of SARS-CoV-2 viral copies this assay can detect is 250 copies / mL. A  negative result does not preclude SARS-CoV-2 infection and should not be used as the sole basis for treatment or other patient management decisions.  A negative result may occur with improper specimen collection / handling, submission of specimen other than nasopharyngeal swab, presence of viral mutation(s) within the areas targeted by this assay, and inadequate number of viral copies (<250 copies / mL). A negative result must be combined with clinical observations, patient history, and epidemiological information.  Fact Sheet for Patients:   StrictlyIdeas.no  Fact Sheet for Healthcare Providers: BankingDealers.co.za  This test is not yet approved or  cleared by the [place] FDA and has been authorized for detection and/or diagnosis of SARS-CoV-2 by FDA under an Emergency Use Authorization (EUA).  This EUA will remain in effect (meaning this test can be used) for the duration of the COVID-19 declaration under Section 564(b)(1) of the Act, 21 U.S.C. section 360bbb-3(b)(1), unless the authorization is terminated or revoked sooner.  Performed at [facility], [place] [address] [place], [place] [address]   Blood culture (routine x 2)     Status: None   Collection Time: 08/29/19  6:35 PM   Specimen: BLOOD  Result Value Ref Range Status   Specimen Description BLOOD RIGHT ANTECUBITAL  Final   Special Requests   Final    BOTTLES DRAWN AEROBIC ONLY Blood Culture results may not be optimal due to an inadequate volume of blood received in culture bottles   Culture   Final    NO GROWTH 5  DAYS Performed at [facility], [place] [address] [place], [place] [address]    Report Status 09/03/2019 FINAL  Final  Blood culture (routine x 2)     Status: None   Collection Time: 08/29/19  6:35 PM   Specimen: BLOOD LEFT HAND  Result Value Ref Range Status   Specimen Description BLOOD LEFT HAND  Final   Special Requests   Final    BOTTLES DRAWN AEROBIC AND ANAEROBIC Blood Culture adequate volume   Culture   Final    NO GROWTH 5 DAYS Performed at [facility], [place] [address], [place], [place] [address]    Report Status 09/03/2019 FINAL  Final  MRSA PCR Screening     Status: None   Collection Time: 08/30/19 12:38 AM   Specimen: Nasopharyngeal  Result Value Ref Range Status   MRSA by PCR NEGATIVE NEGATIVE Final    Comment:        The GeneXpert MRSA Assay (FDA approved for NASAL specimens only), is one component of a comprehensive MRSA colonization surveillance program. It is not intended to diagnose MRSA infection nor to guide or monitor treatment for MRSA infections. Performed at [facility], [place] [address], [place], [place] [address]   Culture, respiratory (non-expectorated)     Status: None   Collection Time: 08/30/19 12:12 PM   Specimen: Tracheal Aspirate; Respiratory  Result Value Ref Range Status   Specimen Description TRACHEAL ASPIRATE  Final   Special Requests NONE  Final   Gram Stain   Final    RARE WBC PRESENT, PREDOMINANTLY PMN RARE GRAM POSITIVE COCCI IN PAIRS RARE GRAM NEGATIVE RODS    Culture   Final    RARE Consistent with normal respiratory flora. Performed at [facility], [place] [address], [place],  [address]    Report Status 09/01/2019 FINAL  Final  Respiratory Panel by PCR     Status: None   Collection Time: 08/31/19  8:59 AM   Specimen: Nasopharyngeal Swab; Respiratory  Result Value Ref Range Status   Adenovirus NOT DETECTED NOT DETECTED Final   Coronavirus 229E NOT DETECTED NOT DETECTED Final    Comment:  (NOTE) The Coronavirus on the Respiratory Panel, DOES NOT test for the novel  Coronavirus (2019 nCoV)    Coronavirus HKU1 NOT DETECTED NOT DETECTED Final   Coronavirus NL63 NOT DETECTED NOT DETECTED Final   Coronavirus OC43 NOT DETECTED NOT DETECTED Final   Metapneumovirus NOT DETECTED NOT DETECTED Final   Rhinovirus / Enterovirus NOT DETECTED NOT DETECTED Final   Influenza A NOT DETECTED NOT DETECTED Final   Influenza B NOT DETECTED NOT DETECTED Final   Parainfluenza Virus 1 NOT DETECTED NOT DETECTED Final   Parainfluenza Virus 2 NOT DETECTED NOT DETECTED Final   Parainfluenza Virus 3 NOT DETECTED NOT DETECTED Final   Parainfluenza Virus 4 NOT DETECTED NOT DETECTED Final   Respiratory Syncytial Virus NOT DETECTED NOT DETECTED Final   Bordetella pertussis NOT DETECTED NOT DETECTED Final   Chlamydophila pneumoniae NOT DETECTED NOT DETECTED Final   Mycoplasma pneumoniae NOT DETECTED NOT DETECTED Final    Comment: Performed at [facility], [place]. [address] [place], [place] [address]      Radiology Studies: CARDIAC CATHETERIZATION  Result Date: 09/03/2019  2nd Mrg lesion is 99% stenosed.  Please see Dr. Clayborne Dana full cath report for details of the diagnostic catheterization. Unsuccessful attempt at PCI of the occluded OM branch. I was unable to cross the lesion with a wire. No balloon angioplasty performed. Discussed case with Dr. Haroldine Laws. Will load with Plavix and continue ASA.   CARDIAC CATHETERIZATION  Result Date: 09/03/2019  Ost RCA to Prox RCA lesion is 100% stenosed.  Previously placed Prox LAD to Mid LAD stent (unknown type) is widely patent.  1st Diag lesion is 90% stenosed.  Ost LAD lesion is 30% stenosed.  Prox Cx to Mid Cx lesion is 50% stenosed.  Ost Cx to Prox Cx lesion is 40% stenosed.  2nd Mrg lesion is 99% stenosed.  Findings: Ao = 116/58 (79) LV = 118/10 RA = 10 RV = 43/8 PA = 45/19 (26) PCW = 14 Fick cardiac output/index = 5.5/2.9 PVR = 2.2 Ao sat = 99%  PA sat = 65%, 65% Assessment: 1.Significant CAD with left dominant system 2. Separate ostia for LAD & LCx 3. LAD with widely patent proximal sten. Mild non-obstructive CAD 4. LCX large dominant vessel with moderate diffuse disease. Culprit lesion appears to be dissected OM-2 branch with 99% lesion 5. RCA small non-dominant that is totally occluded 6. LVEF recovered at 55% 7. Well-compensated hemodynamics. Plan/Discussion: Suspect culprit lesion is OM-2 will attempt PCI if amenable. Otherwise medical therapy. If unable to revascularize, consider LifeVest on d/c. Glori Bickers, MD 9:25 AM     Scheduled Meds: . acetaminophen  650 mg Oral Q4H while awake  . amiodarone  200 mg Oral BID  . aspirin  81 mg Per Tube Daily  . atorvastatin  80 mg Per Tube Daily  . carvedilol  3.125 mg Oral BID WC  . Chlorhexidine Gluconate Cloth  6 each Topical Daily  . [START ON 09/04/2019] clopidogrel  75 mg Oral Q breakfast  . docusate  100 mg Oral BID  . [START ON 09/04/2019] enoxaparin (LOVENOX) injection  40 mg Subcutaneous Q24H  . insulin aspart  0-9 Units Subcutaneous TID WC  . mouth rinse  15 mL Mouth Rinse BID  . pantoprazole  40 mg Oral  QHS  . polyethylene glycol  17 g Oral Daily  . sodium chloride flush  10-40 mL Intracatheter Q12H  . sodium chloride flush  3 mL Intravenous Q12H  . sodium chloride flush  3 mL Intravenous Q12H  . spironolactone  50 mg Oral Daily   Continuous Infusions: . sodium chloride    . cefTRIAXone (ROCEPHIN)  IV 2 g (09/02/19 1133)     LOS: 5 days    Time spent: 25 minutes spent in the coordination of care today.    Jonnie Finner, DO [facility]  If 7PM-7AM, please contact night-coverage [email or web address] 09/03/2019, 6:26 PM

## 2019-09-03 NOTE — Progress Notes (Signed)
Physical Therapy Treatment Patient Details Name: Peter Lambert MRN: [id number] DOB: 05-27-1945 Today's Date: 09/03/2019    History of Present Illness Pt is 74 year old man with hx of CAD, COPD, admitted 7/3 after out of hospital Vfib cardiac arrest with shock x2, epi x5, amio and bicarb before ROSC, suspected sepsis/CAP.  Unknown downtime, did not receive out of hospital CPR.  Patient extubated 7/5.    PT Comments    TR band has been removed and RN in agreement with mobilization. Pt in recliner with sons in the room on entry, agreeable to ambulation. Pt verbalizes inability to put pressure through R UE due to catheterization. Pt is limited in safe mobility by increased O2 demand (see General Comments) in presence of decreased balance and generalized weakness. Pt is min A for transfers and min guard for ambulation with RW. Pt experienced 1x LoB requiring min A for steadying. Pt and PT in agreement that RW usage is needed until he gains more strength and balance. D/c plans remain appropriate at this time. PT will continue to follow acutely.     Follow Up Recommendations  Home health PT;Supervision/Assistance - 24 hour     Equipment Recommendations  Rolling walker with 5" wheels       Precautions / Restrictions Precautions Precautions: Fall Restrictions Weight Bearing Restrictions: No    Mobility  Bed Mobility Overal bed mobility: Needs Assistance             General bed mobility comments: OOB in recliner   Transfers Overall transfer level: Needs assistance Equipment used: Rolling walker (2 wheeled) Transfers: Sit to/from Stand Sit to Stand: Min assist         General transfer comment: min A  with power up on R side due to catheter site, light steadying required   Ambulation/Gait Ambulation/Gait assistance: Min assist;Min guard Gait Distance (Feet): 300 Feet Assistive device: Rolling walker (2 wheeled) Gait Pattern/deviations: Step-through pattern;Decreased stride  length;Drifts right/left Gait velocity: slowed Gait velocity interpretation: 1.31 - 2.62 ft/sec, indicative of limited community ambulator General Gait Details: min guard for safety with 1x min A required for steadying with LoB, pt previously request to try ambulation without AD, after LoB PT and pt agree use of RW until stronger is advised       Balance Overall balance assessment: Needs assistance   Sitting balance-Leahy Scale: Good     Standing balance support: Single extremity supported Standing balance-Leahy Scale: Poor Standing balance comment: UE support in standing today                            Cognition Arousal/Alertness: Awake/alert Behavior During Therapy: WFL for tasks assessed/performed Overall Cognitive Status: Within Functional Limits for tasks assessed                                 General Comments: very pleasant          General Comments General comments (skin integrity, edema, etc.): 2L via [place] on entry with SaO2 97%O2, removed and SaO2 dropped to 92%O2, with standing on RA SaO2 dropped to 87%O2, placed 2L O2 via [place] for ambulation and SaO2 >92%O2, Son Peter Lambert in room, pt prefers to sit in straight back chair in window, advised pt and son to call for assit for transfer back to recliner or bed before [employer], both verbalize understanding  Pertinent Vitals/Pain Pain Assessment: Faces Faces Pain Scale: Hurts a little bit Pain Location: chest  Pain Descriptors / Indicators: Sore;Discomfort;Grimacing Pain Intervention(s): Limited activity within patient's tolerance;Monitored during session;Repositioned           PT Goals (current goals can now be found in the care plan section) Acute Rehab PT Goals Patient Stated Goal: To go home PT Goal Formulation: With patient Time For Goal Achievement: 09/15/19 Potential to Achieve Goals: Good Progress towards PT goals: Progressing toward goals    Frequency    Min 3X/week       PT Plan Current plan remains appropriate       AM-PAC PT "6 Clicks" Mobility   Outcome Measure  Help needed turning from your back to your side while in a flat bed without using bedrails?: A Little Help needed moving from lying on your back to sitting on the side of a flat bed without using bedrails?: A Lot Help needed moving to and from a bed to a chair (including a wheelchair)?: A Little Help needed standing up from a chair using your arms (e.g., wheelchair or bedside chair)?: A Little Help needed to walk in hospital room?: A Little Help needed climbing 3-5 steps with a railing? : A Lot 6 Click Score: 16    End of Session Equipment Utilized During Treatment: Gait belt;Oxygen Activity Tolerance: Patient tolerated treatment well Patient left: in chair;with call bell/phone within reach Nurse Communication: Mobility status PT Visit Diagnosis: Other abnormalities of gait and mobility (R26.89);Muscle weakness (generalized) (M62.81)     Time: 3546-5681 PT Time Calculation (min) (ACUTE ONLY): 23 min  Charges:  $Gait Training: 23-37 mins                     Peter Lambert B. Migdalia Dk PT, DPT Acute Rehabilitation Services Pager [phone] Office [phone]    [place] 09/03/2019, 5:29 PM

## 2019-09-04 ENCOUNTER — Inpatient Hospital Stay (HOSPITAL_COMMUNITY)
Admission: EM | Disposition: A | Discharge status: Home Health | Payer: Self-pay | Source: Home / Self Care | Attending: Internal Medicine

## 2019-09-04 DIAGNOSIS — I4901 Ventricular fibrillation: Secondary | ICD-10-CM

## 2019-09-04 HISTORY — PX: ICD IMPLANT: EP1208

## 2019-09-04 LAB — CBC WITH DIFFERENTIAL/PLATELET
Abs Immature Granulocytes: 0.06 10*3/uL (ref 0.00–0.07)
Basophils Absolute: 0.1 10*3/uL (ref 0.0–0.1)
Basophils Relative: 1 %
Eosinophils Absolute: 0.4 10*3/uL (ref 0.0–0.5)
Eosinophils Relative: 6 %
HCT: 32.1 % — ABNORMAL LOW (ref 39.0–52.0)
Hemoglobin: 10.3 g/dL — ABNORMAL LOW (ref 13.0–17.0)
Immature Granulocytes: 1 %
Lymphocytes Relative: 15 %
Lymphs Abs: 1.1 10*3/uL (ref 0.7–4.0)
MCH: 28.7 pg (ref 26.0–34.0)
MCHC: 32.1 g/dL (ref 30.0–36.0)
MCV: 89.4 fL (ref 80.0–100.0)
Monocytes Absolute: 1 10*3/uL (ref 0.1–1.0)
Monocytes Relative: 13 %
Neutro Abs: 4.7 10*3/uL (ref 1.7–7.7)
Neutrophils Relative %: 64 %
Platelets: 172 10*3/uL (ref 150–400)
RBC: 3.59 MIL/uL — ABNORMAL LOW (ref 4.22–5.81)
RDW: 15.1 % (ref 11.5–15.5)
WBC: 7.4 10*3/uL (ref 4.0–10.5)
nRBC: 0 % (ref 0.0–0.2)

## 2019-09-04 LAB — COMPREHENSIVE METABOLIC PANEL
ALT: 32 U/L (ref 0–44)
AST: 24 U/L (ref 15–41)
Albumin: 3.1 g/dL — ABNORMAL LOW (ref 3.5–5.0)
Alkaline Phosphatase: 53 U/L (ref 38–126)
Anion gap: 9 (ref 5–15)
BUN: 16 mg/dL (ref 8–23)
CO2: 27 mmol/L (ref 22–32)
Calcium: 9.2 mg/dL (ref 8.9–10.3)
Chloride: 103 mmol/L (ref 98–111)
Creatinine, Ser: 1.31 mg/dL — ABNORMAL HIGH (ref 0.61–1.24)
GFR calc Af Amer: >60 mL/min (ref >60)
GFR calc non Af Amer: 53 mL/min — ABNORMAL LOW (ref >60)
Glucose, Bld: 102 mg/dL — ABNORMAL HIGH (ref 70–99)
Potassium: 3.8 mmol/L (ref 3.5–5.1)
Sodium: 139 mmol/L (ref 135–145)
Total Bilirubin: 0.9 mg/dL (ref 0.3–1.2)
Total Protein: 6.6 g/dL (ref 6.5–8.1)

## 2019-09-04 LAB — GLUCOSE, CAPILLARY
Glucose-Capillary: 127 mg/dL — ABNORMAL HIGH (ref 70–99)
Glucose-Capillary: 96 mg/dL (ref 70–99)

## 2019-09-04 LAB — SURGICAL PCR SCREEN
MRSA, PCR: NEGATIVE
Staphylococcus aureus: NEGATIVE

## 2019-09-04 LAB — MAGNESIUM: Magnesium: 1.9 mg/dL (ref 1.7–2.4)

## 2019-09-04 SURGERY — ICD IMPLANT
Anesthesia: LOCAL

## 2019-09-04 MED ORDER — APIXABAN 5 MG PO TABS: 5.0000 mg | ORAL_TABLET | Freq: Two times a day (BID) | ORAL | Status: DC

## 2019-09-04 MED ORDER — CEFAZOLIN SODIUM-DEXTROSE 1-4 GM/50ML-% IV SOLN
1.0000 g | Freq: Four times a day (QID) | INTRAVENOUS | Status: AC
  Administered 2019-09-04 – 2019-09-05 (×3): 1 g via INTRAVENOUS
  Filled 2019-09-04 (×4): qty 50

## 2019-09-04 MED ORDER — LIDOCAINE HCL 1 % IJ SOLN
INTRAMUSCULAR | Status: AC
  Filled 2019-09-04: qty 20

## 2019-09-04 MED ORDER — CEFAZOLIN SODIUM-DEXTROSE 2-4 GM/100ML-% IV SOLN
INTRAVENOUS | Status: AC
  Filled 2019-09-04: qty 100

## 2019-09-04 MED ORDER — POTASSIUM CHLORIDE CRYS ER 20 MEQ PO TBCR
30.0000 meq | EXTENDED_RELEASE_TABLET | Freq: Once | ORAL | Status: AC
  Administered 2019-09-04: 30 meq via ORAL
  Filled 2019-09-04: qty 1

## 2019-09-04 MED ORDER — MIDAZOLAM HCL 5 MG/5ML IJ SOLN
INTRAMUSCULAR | Status: DC | PRN
  Administered 2019-09-04: 1 mg via INTRAVENOUS
  Administered 2019-09-04: 2 mg via INTRAVENOUS
  Administered 2019-09-04 (×2): 1 mg via INTRAVENOUS

## 2019-09-04 MED ORDER — LIDOCAINE HCL (PF) 1 % IJ SOLN
INTRAMUSCULAR | Status: DC | PRN
  Administered 2019-09-04: 60 mL

## 2019-09-04 MED ORDER — FENTANYL CITRATE (PF) 100 MCG/2ML IJ SOLN
INTRAMUSCULAR | Status: DC | PRN
  Administered 2019-09-04 (×2): 12.5 ug via INTRAVENOUS
  Administered 2019-09-04 (×2): 25 ug via INTRAVENOUS

## 2019-09-04 MED ORDER — AMIODARONE LOAD VIA INFUSION
150.0000 mg | Freq: Once | INTRAVENOUS | Status: AC
  Administered 2019-09-04: 150 mg via INTRAVENOUS
  Filled 2019-09-04: qty 83.34

## 2019-09-04 MED ORDER — HEPARIN (PORCINE) IN NACL 1000-0.9 UT/500ML-% IV SOLN
INTRAVENOUS | Status: DC | PRN
  Administered 2019-09-04: 500 mL

## 2019-09-04 MED ORDER — AMIODARONE HCL 200 MG PO TABS
200.0000 mg | ORAL_TABLET | Freq: Two times a day (BID) | ORAL | Status: DC
  Administered 2019-09-04 – 2019-09-05 (×2): 200 mg via ORAL
  Filled 2019-09-04 (×2): qty 1

## 2019-09-04 MED ORDER — AMIODARONE HCL IN DEXTROSE 360-4.14 MG/200ML-% IV SOLN: 30.0000 mg/h | INTRAVENOUS | Status: DC

## 2019-09-04 MED ORDER — MAGNESIUM SULFATE 2 GM/50ML IV SOLN
2.0000 g | Freq: Once | INTRAVENOUS | Status: AC
  Administered 2019-09-04: 2 g via INTRAVENOUS
  Filled 2019-09-04: qty 50

## 2019-09-04 MED ORDER — SODIUM CHLORIDE 0.9 % IV SOLN
INTRAVENOUS | Status: AC
  Filled 2019-09-04: qty 2

## 2019-09-04 MED ORDER — AMIODARONE HCL IN DEXTROSE 360-4.14 MG/200ML-% IV SOLN
60.0000 mg/h | INTRAVENOUS | Status: DC
  Administered 2019-09-04: 60 mg/h via INTRAVENOUS
  Filled 2019-09-04: qty 200

## 2019-09-04 MED ORDER — HEPARIN (PORCINE) IN NACL 1000-0.9 UT/500ML-% IV SOLN
INTRAVENOUS | Status: AC
  Filled 2019-09-04: qty 500

## 2019-09-04 MED ORDER — FENTANYL CITRATE (PF) 100 MCG/2ML IJ SOLN
INTRAMUSCULAR | Status: AC
  Filled 2019-09-04: qty 2

## 2019-09-04 MED ORDER — ACETAMINOPHEN 325 MG PO TABS: 325.0000 mg | ORAL_TABLET | ORAL | Status: DC | PRN

## 2019-09-04 MED ORDER — MELATONIN 3 MG PO TABS
3.0000 mg | ORAL_TABLET | Freq: Every day | ORAL | Status: DC
  Administered 2019-09-05: 3 mg via ORAL
  Filled 2019-09-04: qty 1

## 2019-09-04 MED ORDER — MIDAZOLAM HCL 5 MG/5ML IJ SOLN
INTRAMUSCULAR | Status: AC
  Filled 2019-09-04: qty 5

## 2019-09-04 MED ORDER — ONDANSETRON HCL 4 MG/2ML IJ SOLN: 4.0000 mg | Freq: Four times a day (QID) | INTRAMUSCULAR | Status: DC | PRN

## 2019-09-04 SURGICAL SUPPLY — 8 items
CABLE SURGICAL S-101-97-12 (CABLE) ×2 IMPLANT
ICD MOMENTUM D121 (ICD Generator) ×2 IMPLANT
LEAD INGEVITY 7841 52 (Lead) ×1 IMPLANT
LEAD RELIANCE 0137-59 (Lead) ×1 IMPLANT
PAD PRO RADIOLUCENT 2001M-C (PAD) ×2 IMPLANT
SHEATH 7FR PRELUDE SNAP 13 (SHEATH) ×2 IMPLANT
SHEATH 9FR PRELUDE SNAP 13 (SHEATH) ×1 IMPLANT
TRAY PACEMAKER INSERTION (PACKS) ×2 IMPLANT

## 2019-09-04 NOTE — Progress Notes (Signed)
On bedside reporting, Patient noted to be in a flutter, HR in the 120s to 130s. Neoma Laming stated patient had an EKG done this am. We looked at it and confirmed aflutter and EKG stated critical high QTC. We called Oda Kilts at the bedside and made him aware. He stated he would come and see patient

## 2019-09-04 NOTE — Progress Notes (Addendum)
Advanced Heart Failure Rounding Note  PCP-Cardiologist: Quay Burow, MD   Subjective:    Admitted with VF arrest and LV dysfunction. Shocked by EMS x2.  Peak hstrop 3,900. EF initially 20%--> 55% on cath 7/8  EP consulted for ICD.   Earlier this morning went in A flutter.   Denies SOB. Denies chest pain.   Objective:   Weight Range: 74.5 kg Body mass index is 24.97 kg/m.   Vital Signs:   Temp:  [98.2 F (36.8 C)-99.3 F (37.4 C)] 98.6 F (37 C) (07/09 0749) Pulse Rate:  [0-124] 124 (07/09 0749) Resp:  [11-21] 18 (07/09 0749) BP: (115-156)/(65-98) 115/79 (07/09 0749) SpO2:  [0 %-100 %] 100 % (07/09 0749) Weight:  [74.5 kg] 74.5 kg (07/09 0357) Last BM Date:  (pt unable to recall)  Weight change: Filed Weights   09/01/19 0630 09/02/19 0349 09/04/19 0357  Weight: 76.8 kg 76.7 kg 74.5 kg    Intake/Output:   Intake/Output Summary (Last 24 hours) at 09/04/2019 0810 Last data filed at 09/04/2019 0420 Gross per 24 hour  Intake 873.93 ml  Output 1550 ml  Net -676.07 ml      Physical Exam   General:  Sitting on the side of the bed. Well appearing. No resp difficulty HEENT: normal Neck: supple. no JVD. Carotids 2+ bilat; no bruits. No lymphadenopathy or thryomegaly appreciated. Cor: PMI nondisplaced. Tachy  rate & rhythm. No rubs, gallops or murmurs. Lungs: clear Abdomen: soft, nontender, nondistended. No hepatosplenomegaly. No bruits or masses. Good bowel sounds. Extremities: no cyanosis, clubbing, rash, edema Neuro: alert & orientedx3, cranial nerves grossly intact. moves all 4 extremities w/o difficulty. Affect pleasant   Telemetry    A flutter 130s    EKG    N/a   Labs    CBC Recent Labs    09/03/19 0458 09/03/19 0843 09/03/19 0848 09/04/19 0409  WBC 7.6  --   --  7.4  NEUTROABS  --   --   --  4.7  HGB 9.9*   < > 10.2*  10.2* 10.3*  HCT 32.0*   < > 30.0*  30.0* 32.1*  MCV 89.1  --   --  89.4  PLT 167  --   --  172   < > = values in  this interval not displayed.   Basic Metabolic Panel Recent Labs    09/03/19 0458 09/03/19 0843 09/03/19 0848 09/04/19 0409  NA 140   < > 141  137 139  K 3.9   < > 3.9  3.5 3.8  CL 102  --   --  103  CO2 29  --   --  27  GLUCOSE 108*  --   --  102*  BUN 23  --   --  16  CREATININE 1.45*  --   --  1.31*  CALCIUM 9.1  --   --  9.2  MG 2.1  --   --  1.9   < > = values in this interval not displayed.   Liver Function Tests Recent Labs    09/04/19 0409  AST 24  ALT 32  ALKPHOS 53  BILITOT 0.9  PROT 6.6  ALBUMIN 3.1*   No results for input(s): LIPASE, AMYLASE in the last 72 hours. Cardiac Enzymes No results for input(s): CKTOTAL, CKMB, CKMBINDEX, TROPONINI in the last 72 hours.  BNP: BNP (last 3 results) Recent Labs    04/20/19 0846 04/23/19 2037 08/29/19 1819  BNP 580.0* 793.0* 202.2*  ProBNP (last 3 results) No results for input(s): PROBNP in the last 8760 hours.   D-Dimer No results for input(s): DDIMER in the last 72 hours. Hemoglobin A1C No results for input(s): HGBA1C in the last 72 hours. Fasting Lipid Panel No results for input(s): CHOL, HDL, LDLCALC, TRIG, CHOLHDL, LDLDIRECT in the last 72 hours. Thyroid Function Tests No results for input(s): TSH, T4TOTAL, T3FREE, THYROIDAB in the last 72 hours.  Invalid input(s): FREET3  Other results:   Imaging    CARDIAC CATHETERIZATION  Result Date: 09/03/2019  2nd Mrg lesion is 99% stenosed.  Please see Dr. Clayborne Dana full cath report for details of the diagnostic catheterization. Unsuccessful attempt at PCI of the occluded OM branch. I was unable to cross the lesion with a wire. No balloon angioplasty performed. Discussed case with Dr. Haroldine Laws. Will load with Plavix and continue ASA.   CARDIAC CATHETERIZATION  Result Date: 09/03/2019  Ost RCA to Prox RCA lesion is 100% stenosed.  Previously placed Prox LAD to Mid LAD stent (unknown type) is widely patent.  1st Diag lesion is 90% stenosed.   Ost LAD lesion is 30% stenosed.  Prox Cx to Mid Cx lesion is 50% stenosed.  Ost Cx to Prox Cx lesion is 40% stenosed.  2nd Mrg lesion is 99% stenosed.  Findings: Ao = 116/58 (79) LV = 118/10 RA = 10 RV = 43/8 PA = 45/19 (26) PCW = 14 Fick cardiac output/index = 5.5/2.9 PVR = 2.2 Ao sat = 99% PA sat = 65%, 65% Assessment: 1.Significant CAD with left dominant system 2. Separate ostia for LAD & LCx 3. LAD with widely patent proximal sten. Mild non-obstructive CAD 4. LCX large dominant vessel with moderate diffuse disease. Culprit lesion appears to be dissected OM-2 branch with 99% lesion 5. RCA small non-dominant that is totally occluded 6. LVEF recovered at 55% 7. Well-compensated hemodynamics. Plan/Discussion: Suspect culprit lesion is OM-2 will attempt PCI if amenable. Otherwise medical therapy. If unable to revascularize, consider LifeVest on d/c. Glori Bickers, MD 9:25 AM     Medications:     Scheduled Medications: . acetaminophen  650 mg Oral Q4H while awake  . amiodarone  200 mg Oral BID  . aspirin  81 mg Per Tube Daily  . atorvastatin  80 mg Per Tube Daily  . carvedilol  3.125 mg Oral BID WC  . chlorhexidine  60 mL Topical Once  . docusate  100 mg Oral BID  . gentamicin irrigation  80 mg Irrigation On Call  . insulin aspart  0-9 Units Subcutaneous TID WC  . mouth rinse  15 mL Mouth Rinse BID  . pantoprazole  40 mg Oral QHS  . polyethylene glycol  17 g Oral Daily  . sodium chloride flush  10-40 mL Intracatheter Q12H  . sodium chloride flush  3 mL Intravenous Q12H  . sodium chloride flush  3 mL Intravenous Q12H  . spironolactone  50 mg Oral Daily    Infusions: . sodium chloride    . sodium chloride    . sodium chloride 50 mL/hr at 09/04/19 0611  .  ceFAZolin (ANCEF) IV    . cefTRIAXone (ROCEPHIN)  IV 2 g (09/02/19 1133)    PRN Medications: sodium chloride, acetaminophen, albuterol, docusate sodium, hydrocortisone cream, ondansetron (ZOFRAN) IV, sodium chloride flush,  sodium chloride flush    Patient Profile  Mr. Courtsis a 74 y.o.with DM2, HTN, PAD s/p aorto-bifem graft, tobacco use and CAD s/p NSTEMI in April 2010. Cath showed revealed high-grade proximal  LAD stenosis -> PCI 2.75 x 18 mm long Xience V drug-eluting stent. He had a dominant circumflex system with an 80% distal AV groove circumflex as well as a nondominant RCA. He had normal LV function. Abdominal aortogram revealed a patent aortobifemoral bypass graft.    Assessment/Plan   1. VF arrest - suspect ischemic in nature but based on ECG it is possible that he has PVC cardiomyopathy leading to primary VF event\ - Cath today with OM-2 with likely culprit lesion - EF now 20% -> 55% - Continue amio po 200 mg twice a day.  - Plan for ICD today.    2. NSTEMI with known CAD and LAD stent 4/10 - has h/o CAD - hstrop peaked at 19,894 - Cath as above.  - Will need DAPT  3. Acute systolic HF due to severe biventricular dysfunction - Echo 08/30/19 EF < 20% RV severely HKPersonally reviewed - Echo 2/21 EF 50-55% - EF improved post arrest 20% -> 55% - Volume status stable.  - Continue low dose carvedilol.  - Continue spironolactone daily.   4. Acute hypoxic respiratory failure - due to #1 - Extubated 7/5. Resolved    5. AKI on CKD 3b - baseline creatinine ~1.5-1.8 - Creatinine back down to 1.3   6. Bilateral aspiration PNA - PCT 3.97. On ceftriaxone  7. PAD - s/p aortobifem graft  8. Anemia  - hgs8stable 9.9  9. A flutter This morning. EP following plan for cardioversion today.    EP placing ICD today and getting DC-CV   Per Dr Haroldine Laws place back on general cardiology service. EF up to 55%   Length of Stay: [place], NP  09/04/2019, 8:10 AM  Advanced Heart Failure Team Pager [phone] (M-F; [place])  Please contact [facility] for night-coverage after hours (4p -7a ) and weekends on [email or web address]  Patient seen and examined with the above-signed Advanced  Practice Provider and/or Housestaff. I personally reviewed laboratory data, imaging studies and relevant notes. I independently examined the patient and formulated the important aspects of the plan. I have edited the note to reflect any of my changes or salient points. I have personally discussed the plan with the patient and/or family.  Cath yesterday with normal EF and compensated hemodynamics. Culprit lesion OM-2. Unable to be stented. Developed AFL overnight despite po amio. Rates 130s. Plan for DC-CV and ICD placement with EP today  General:  Well appearing. No resp difficulty HEENT: normal Neck: supple. no JVD. Carotids 2+ bilat; no bruits. No lymphadenopathy or thryomegaly appreciated. Cor: PMI nondisplaced. Irreg tachy  No rubs, gallops or murmurs. Lungs: decreased throughout Abdomen: soft, nontender, nondistended. No hepatosplenomegaly. No bruits or masses. Good bowel sounds. Extremities: no cyanosis, clubbing, rash, edema Neuro: alert & orientedx3, cranial nerves grossly intact. moves all 4 extremities w/o difficulty. Affect pleasant  Feels ok despite rapid AFL. Cath results as above.   Will stop Plavix (no stent placed). Start IV amio.  Plan ICD and DC-CV. Stat apixaban when ok with EP.   With normalized EF will have [facility] take back over tomorrow. Likely would continue amio for at least 1 month after d/c. Go home on apixaban +/- ASA 81 for 1 month after NSTEMI.   Glori Bickers, MD  9:16 AM

## 2019-09-04 NOTE — Progress Notes (Signed)
Marland Kitchen  PROGRESS NOTE    Peter Lambert  EHM:[id number] DOB: 26-Nov-1945 DOA: 08/29/2019 PCP: Redmond School, MD   Brief Narrative:   73 year old man with hx of CAD, COPD, admitted 7/3 after out of hospital Vfib cardiac arrest with shock x2, epi x5, amio and bicarb before ROSC, suspected sepsis/CAP. Unknown downtime, did not receive out of hospital CPR.   7/9: Tachy this morning. In afib. Started on amiodarone. Going for ICD placement today. Appreciate cards help.   Assessment & Plan:   Principal Problem:   Cardiac arrest with ventricular fibrillation ([place]) Active Problems:   Peripheral arterial disease ([place])   Coronary artery disease s/p DES to LAD in 2010, s/p PCI to proximal LAD stenosis with DES this hospitalization   Essential hypertension   Hyperlipidemia   Acute respiratory failure with hypoxemia ([place])   Aspiration pneumonia ([place]), bilateral   Demand ischemia of myocardium (HCC)   AKI (acute kidney injury) ([place])   Hypokalemia   Hyperglycemia   Hypoalbuminemia   Elevated brain natriuretic peptide (BNP) level   Lactic acidosis   Leukocytosis   Proteinuria   Hypomagnesemia   NSTEMI (non-ST elevated myocardial infarction) (HCC)   CKD (chronic kidney disease), stage III b   PVC's (premature ventricular contractions)   NSVT (nonsustained ventricular tachycardia) (HCC)   Thrombocytopenia (HCC)   Anemia of infection and chronic disease   Severe sepsis with septic shock ([place]) suspected on admission, source: ? aspiration   Acute metabolic encephalopathy   Acute systolic CHF (congestive heart failure) (HCC)   Anticoagulated on heparin   History of noncompliance with medical treatment  VF Cardiac Arrest Acute Systolic Heart Failure      - s/p prolonged out of hospital cardiac arrest.  Unknown downtime. He was apparently asymptomatic prior to arrest, though exact details unclear.  PNA/CAP and hypokalemia likely contributing.  UDS negative. New reduction of LVEF (prior normal).       - coreg, spironolactone     - cards onboard, now s/p RHC/LHC; Dissected OM-2 branch with 99% lesion noted on exam. EP consulted for ICD vs life vest.     - 7/9: started on amio gtt this AM  Acute Hypoxic Respiratory Failure      - In the setting of V-fib arrest and CAP. RVP negative.      - wean O2 for sats >90%     - completed tx with rocephin     - 7/9: On 2L [place] during interview, continue to wean O2 as able  Acute Metabolic Encephalopathy      - Resolved  AKI on CKD III Hypokalemia     - SCr stable at 1.31; K+ stable/resolved  Normocytic Anemia  Thrombocytopenia      - Hgb 10.3 today; plts resolved  COPD Tobacco Abuse     - PRN albuterol      - counseled against further tobacco use  DM II      - continue SSI; A1c is 6.9  DVT prophylaxis: SCDs Code Status: FULL Family Communication: None at bedside   Status is: Inpatient  Remains inpatient appropriate because:Inpatient level of care appropriate due to severity of illness   Dispo: The patient is from: Home              Anticipated d/c is to: TBD              Anticipated d/c date is: 3 days  Patient currently is not medically stable to d/c.  Consultants:   PCCM  Cardiology  Procedures:   RHC/LHC  Antimicrobials:  . cefazolin   ROS:  Denies dyspnea, ab pain, palpitations, CP . Remainder 10-pt ROS is negative for all not previously mentioned.  Subjective: "They said my heart is fast."  Objective: Vitals:   09/04/19 1327 09/04/19 1330 09/04/19 1332 09/04/19 1400  BP: (!) 95/58  (!) 91/58 126/72  Pulse: 83  80   Resp: 12  18 18   Temp:    97.9 F (36.6 C)  TempSrc:      SpO2: 100% 96% 95%   Weight:      Height:        Intake/Output Summary (Last 24 hours) at 09/04/2019 1413 Last data filed at 09/04/2019 1008 Gross per 24 hour  Intake 873.93 ml  Output 1850 ml  Net -976.07 ml   Filed Weights   09/01/19 0630 09/02/19 0349 09/04/19 0357  Weight: 76.8 kg 76.7 kg 74.5 kg     Examination:  General: 74 y.o. male resting in bed in NAD Cardiovascular: tachy, irregular, +S1, S2, no m/g/r Respiratory: CTABL, no w/r/r, normal WOB GI: BS+, NDNT, soft MSK: No e/c/c Neuro: Alert to name, follows commands Psyc: Appropriate interaction and affect, calm/cooperative   Data Reviewed: I have personally reviewed following labs and imaging studies.  CBC: Recent Labs  Lab 08/29/19 1819 08/29/19 1907 08/31/19 0353 08/31/19 0353 09/01/19 0500 09/01/19 0500 09/02/19 0325 09/03/19 0458 09/03/19 0843 09/03/19 0848 09/04/19 0409  WBC 24.3*   < > 8.9  --  9.0  --  9.3 7.6  --   --  7.4  NEUTROABS 16.8*  --   --   --   --   --   --   --   --   --  4.7  HGB 12.0*   < > 10.5*   < > 9.6*   < > 9.4* 9.9* 10.2* 10.2*  10.2* 10.3*  HCT 39.0   < > 33.4*   < > 30.4*   < > 29.2* 32.0* 30.0* 30.0*  30.0* 32.1*  MCV 90.9   < > 88.8  --  87.9  --  88.0 89.1  --   --  89.4  PLT 218   < > 126*  --  126*  --  135* 167  --   --  172   < > = values in this interval not displayed.   Basic Metabolic Panel: Recent Labs  Lab 08/31/19 0353 08/31/19 0939 08/31/19 0939 08/31/19 1630 09/01/19 0028 09/01/19 0028 09/01/19 0500 09/01/19 0500 09/02/19 0325 09/02/19 0325 09/02/19 1501 09/03/19 0458 09/03/19 0843 09/03/19 0848 09/04/19 0409  NA   < >  --   --   --  137   < > 139   < > 138   < > 139 140 144 141  137 139  K   < >  --   --   --  3.0*   < > 3.3*   < > 3.0*   < > 4.2 3.9 3.7 3.9  3.5 3.8  CL   < >  --   --   --  100   < > 102  --  101  --  102 102  --   --  103  CO2   < >  --   --   --  27   < > 25  --  27  --  27 29  --   --  27  GLUCOSE   < >  --   --   --  123*   < > 122*  --  118*  --  145* 108*  --   --  102*  BUN   < >  --   --   --  48*   < > 48*  --  35*  --  27* 23  --   --  16  CREATININE   < >  --   --   --  2.25*   < > 2.12*  --  1.71*  --  1.47* 1.45*  --   --  1.31*  CALCIUM   < >  --   --   --  8.3*   < > 8.4*  --  8.3*  --  8.5* 9.1  --   --  9.2   MG   < > 2.2   < > 2.1 2.1  --  1.8  --  2.1  --   --  2.1  --   --  1.9  PHOS  --  3.7  --  4.0  --   --  3.3  --   --   --   --   --   --   --   --    < > = values in this interval not displayed.   GFR: Estimated Creatinine Clearance: 47.1 mL/min (A) (by C-G formula based on SCr of 1.31 mg/dL (H)). Liver Function Tests: Recent Labs  Lab 08/29/19 1819 08/29/19 2231 08/30/19 1006 09/04/19 0409  AST 86* 91* 66* 24  ALT 64* 59* 50* 32  ALKPHOS 82 71 54 53  BILITOT 0.7 1.6* 0.8 0.9  PROT 6.7 6.4* 5.9* 6.6  ALBUMIN 3.4* 3.3* 2.9* 3.1*   No results for input(s): LIPASE, AMYLASE in the last 168 hours. No results for input(s): AMMONIA in the last 168 hours. Coagulation Profile: Recent Labs  Lab 08/29/19 1819  INR 1.2   Cardiac Enzymes: No results for input(s): CKTOTAL, CKMB, CKMBINDEX, TROPONINI in the last 168 hours. BNP (last 3 results) No results for input(s): PROBNP in the last 8760 hours. HbA1C: No results for input(s): HGBA1C in the last 72 hours. CBG: Recent Labs  Lab 09/02/19 2103 09/03/19 1208 09/03/19 1608 09/03/19 2056 09/04/19 0945  GLUCAP 157* 139* 119* 119* 127*   Lipid Profile: No results for input(s): CHOL, HDL, LDLCALC, TRIG, CHOLHDL, LDLDIRECT in the last 72 hours. Thyroid Function Tests: No results for input(s): TSH, T4TOTAL, FREET4, T3FREE, THYROIDAB in the last 72 hours. Anemia Panel: Recent Labs    09/02/19 1122  VITAMINB12 147*  FOLATE 5.2*  FERRITIN 235  TIBC 288  IRON 19*  RETICCTPCT 1.5   Sepsis Labs: Recent Labs  Lab 08/29/19 1819 08/29/19 2231 08/29/19 2232 08/30/19 0428 08/30/19 1005 08/31/19 0353  PROCALCITON  --  0.63  --  3.36  --  3.97  LATICACIDVEN 10.2*  --  6.6* 3.3* 1.0  --     Recent Results (from the past 240 hour(s))  SARS Coronavirus 2 by RT PCR (hospital order, performed in [facility] hospital lab) Nasopharyngeal Nasopharyngeal Swab     Status: None   Collection Time: 08/29/19  6:30 PM   Specimen:  Nasopharyngeal Swab  Result Value Ref Range Status   SARS Coronavirus 2 NEGATIVE NEGATIVE Final    Comment: (NOTE) SARS-CoV-2 target nucleic acids are NOT DETECTED.  The SARS-CoV-2 RNA  is generally detectable in upper and lower respiratory specimens during the acute phase of infection. The lowest concentration of SARS-CoV-2 viral copies this assay can detect is 250 copies / mL. A negative result does not preclude SARS-CoV-2 infection and should not be used as the sole basis for treatment or other patient management decisions.  A negative result may occur with improper specimen collection / handling, submission of specimen other than nasopharyngeal swab, presence of viral mutation(s) within the areas targeted by this assay, and inadequate number of viral copies (<250 copies / mL). A negative result must be combined with clinical observations, patient history, and epidemiological information.  Fact Sheet for Patients:   StrictlyIdeas.no  Fact Sheet for Healthcare Providers: BankingDealers.co.za  This test is not yet approved or  cleared by the [place] FDA and has been authorized for detection and/or diagnosis of SARS-CoV-2 by FDA under an Emergency Use Authorization (EUA).  This EUA will remain in effect (meaning this test can be used) for the duration of the COVID-19 declaration under Section 564(b)(1) of the Act, 21 U.S.C. section 360bbb-3(b)(1), unless the authorization is terminated or revoked sooner.  Performed at [facility], [place] [address] [place], [place] [address]   Blood culture (routine x 2)     Status: None   Collection Time: 08/29/19  6:35 PM   Specimen: BLOOD  Result Value Ref Range Status   Specimen Description BLOOD RIGHT ANTECUBITAL  Final   Special Requests   Final    BOTTLES DRAWN AEROBIC ONLY Blood Culture results may not be optimal due to an inadequate volume of blood received in culture bottles    Culture   Final    NO GROWTH 5 DAYS Performed at [facility], [place] [address], [place], [place] [address]    Report Status 09/03/2019 FINAL  Final  Blood culture (routine x 2)     Status: None   Collection Time: 08/29/19  6:35 PM   Specimen: BLOOD LEFT HAND  Result Value Ref Range Status   Specimen Description BLOOD LEFT HAND  Final   Special Requests   Final    BOTTLES DRAWN AEROBIC AND ANAEROBIC Blood Culture adequate volume   Culture   Final    NO GROWTH 5 DAYS Performed at [facility], [place] [address], [place], [place] [address]    Report Status 09/03/2019 FINAL  Final  MRSA PCR Screening     Status: None   Collection Time: 08/30/19 12:38 AM   Specimen: Nasopharyngeal  Result Value Ref Range Status   MRSA by PCR NEGATIVE NEGATIVE Final    Comment:        The GeneXpert MRSA Assay (FDA approved for NASAL specimens only), is one component of a comprehensive MRSA colonization surveillance program. It is not intended to diagnose MRSA infection nor to guide or monitor treatment for MRSA infections. Performed at [facility], [place] [address], [place], [place] [address]   Culture, respiratory (non-expectorated)     Status: None   Collection Time: 08/30/19 12:12 PM   Specimen: Tracheal Aspirate; Respiratory  Result Value Ref Range Status   Specimen Description TRACHEAL ASPIRATE  Final   Special Requests NONE  Final   Gram Stain   Final    RARE WBC PRESENT, PREDOMINANTLY PMN RARE GRAM POSITIVE COCCI IN PAIRS RARE GRAM NEGATIVE RODS    Culture   Final    RARE Consistent with normal respiratory flora. Performed at [facility],  [address] [place], [place] [address]    Report Status 09/01/2019 FINAL  Final  Respiratory Panel by PCR     Status: None   Collection Time: 08/31/19  8:59 AM   Specimen: Nasopharyngeal Swab; Respiratory  Result Value Ref Range Status   Adenovirus NOT DETECTED NOT DETECTED Final   Coronavirus 229E NOT DETECTED NOT  DETECTED Final    Comment: (NOTE) The Coronavirus on the Respiratory Panel, DOES NOT test for the novel  Coronavirus (2019 nCoV)    Coronavirus HKU1 NOT DETECTED NOT DETECTED Final   Coronavirus NL63 NOT DETECTED NOT DETECTED Final   Coronavirus OC43 NOT DETECTED NOT DETECTED Final   Metapneumovirus NOT DETECTED NOT DETECTED Final   Rhinovirus / Enterovirus NOT DETECTED NOT DETECTED Final   Influenza A NOT DETECTED NOT DETECTED Final   Influenza B NOT DETECTED NOT DETECTED Final   Parainfluenza Virus 1 NOT DETECTED NOT DETECTED Final   Parainfluenza Virus 2 NOT DETECTED NOT DETECTED Final   Parainfluenza Virus 3 NOT DETECTED NOT DETECTED Final   Parainfluenza Virus 4 NOT DETECTED NOT DETECTED Final   Respiratory Syncytial Virus NOT DETECTED NOT DETECTED Final   Bordetella pertussis NOT DETECTED NOT DETECTED Final   Chlamydophila pneumoniae NOT DETECTED NOT DETECTED Final   Mycoplasma pneumoniae NOT DETECTED NOT DETECTED Final    Comment: Performed at [facility], [place] [address], [place], [place] [address]  Surgical PCR screen     Status: None   Collection Time: 09/03/19  9:26 PM   Specimen: Nasal Mucosa; Nasal Swab  Result Value Ref Range Status   MRSA, PCR NEGATIVE NEGATIVE Final   Staphylococcus aureus NEGATIVE NEGATIVE Final    Comment: (NOTE) The Xpert SA Assay (FDA approved for NASAL specimens in patients 53 years of age and older), is one component of a comprehensive surveillance program. It is not intended to diagnose infection nor to guide or monitor treatment. Performed at [facility], [place] [address] [place], [place] [address]       Radiology Studies: CARDIAC CATHETERIZATION  Result Date: 09/03/2019  2nd Mrg lesion is 99% stenosed.  Please see Dr. Clayborne Dana full cath report for details of the diagnostic catheterization. Unsuccessful attempt at PCI of the occluded OM branch. I was unable to cross the lesion with a wire. No balloon angioplasty  performed. Discussed case with Dr. Haroldine Laws. Will load with Plavix and continue ASA.   CARDIAC CATHETERIZATION  Result Date: 09/03/2019  Ost RCA to Prox RCA lesion is 100% stenosed.  Previously placed Prox LAD to Mid LAD stent (unknown type) is widely patent.  1st Diag lesion is 90% stenosed.  Ost LAD lesion is 30% stenosed.  Prox Cx to Mid Cx lesion is 50% stenosed.  Ost Cx to Prox Cx lesion is 40% stenosed.  2nd Mrg lesion is 99% stenosed.  Findings: Ao = 116/58 (79) LV = 118/10 RA = 10 RV = 43/8 PA = 45/19 (26) PCW = 14 Fick cardiac output/index = 5.5/2.9 PVR = 2.2 Ao sat = 99% PA sat = 65%, 65% Assessment: 1.Significant CAD with left dominant system 2. Separate ostia for LAD & LCx 3. LAD with widely patent proximal sten. Mild non-obstructive CAD 4. LCX large dominant vessel with moderate diffuse disease. Culprit lesion appears to be dissected OM-2 branch with 99% lesion 5. RCA small non-dominant that is totally occluded 6. LVEF recovered at 55% 7. Well-compensated hemodynamics. Plan/Discussion: Suspect culprit lesion is OM-2 will attempt PCI if amenable. Otherwise  medical therapy. If unable to revascularize, consider LifeVest on d/c. Glori Bickers, MD 9:25 AM   EP PPM/ICD IMPLANT  Result Date: 09/04/2019 CONCLUSIONS:  1. VF cardiac arrest.  2. Successful ICD implantation.  3. DFT less than or equal to 21 joules.  4. No early apparent complications. ICD Criteria Current LVEF:55%. Within 12 months prior to implant: Yes Heart failure history: No Cardiomyopathy history: No. Atrial Fibrillation/Atrial Flutter: Yes, Paroxysmal. Ventricular tachycardia history: No. Cardiac arrest history: Yes, Ventricular Fibrillation. History of syndromes with risk of sudden death: No. Previous ICD: No. Current ICD indication: Secondary PPM indication: No.  Class I or II Bradycardia indication present: No Beta Blocker therapy for 3 or more months: No, medical reason. Ace Inhibitor/ARB therapy for 3 or more months: No,  medical reason. Cristopher Peru, MD 1:42 PM 09/04/2019     Scheduled Meds: . acetaminophen  650 mg Oral Q4H while awake  . amiodarone  200 mg Oral BID  . aspirin  81 mg Per Tube Daily  . atorvastatin  80 mg Per Tube Daily  . carvedilol  3.125 mg Oral BID WC  . docusate  100 mg Oral BID  . insulin aspart  0-9 Units Subcutaneous TID WC  . mouth rinse  15 mL Mouth Rinse BID  . pantoprazole  40 mg Oral QHS  . polyethylene glycol  17 g Oral Daily  . sodium chloride flush  10-40 mL Intracatheter Q12H  . sodium chloride flush  3 mL Intravenous Q12H  . sodium chloride flush  3 mL Intravenous Q12H  . spironolactone  50 mg Oral Daily   Continuous Infusions: . sodium chloride    .  ceFAZolin (ANCEF) IV       LOS: 6 days    Time spent: 25 minutes spent in the coordination of care today.    Jonnie Finner, DO [facility]  If 7PM-7AM, please contact night-coverage [email or web address] 09/04/2019, 2:13 PM

## 2019-09-04 NOTE — Progress Notes (Addendum)
Electrophysiology Rounding Note  Patient Name: Peter Lambert Date of Encounter: 09/04/2019  Primary Cardiologist: Quay Burow, MD Electrophysiologist: None   Subjective   The patient is doing well today.  At this time, the patient denies chest pain, shortness of breath, or any new concerns.  Inpatient Medications    Scheduled Meds: . acetaminophen  650 mg Oral Q4H while awake  . amiodarone  150 mg Intravenous Once  . aspirin  81 mg Per Tube Daily  . atorvastatin  80 mg Per Tube Daily  . carvedilol  3.125 mg Oral BID WC  . chlorhexidine  60 mL Topical Once  . docusate  100 mg Oral BID  . gentamicin irrigation  80 mg Irrigation On Call  . insulin aspart  0-9 Units Subcutaneous TID WC  . mouth rinse  15 mL Mouth Rinse BID  . pantoprazole  40 mg Oral QHS  . polyethylene glycol  17 g Oral Daily  . potassium chloride  30 mEq Oral Once  . sodium chloride flush  10-40 mL Intracatheter Q12H  . sodium chloride flush  3 mL Intravenous Q12H  . sodium chloride flush  3 mL Intravenous Q12H  . spironolactone  50 mg Oral Daily   Continuous Infusions: . sodium chloride    . sodium chloride    . sodium chloride 50 mL/hr at 09/04/19 0611  . amiodarone     Followed by  . amiodarone    .  ceFAZolin (ANCEF) IV    . cefTRIAXone (ROCEPHIN)  IV 2 g (09/02/19 1133)  . magnesium sulfate bolus IVPB     PRN Meds: sodium chloride, acetaminophen, albuterol, docusate sodium, hydrocortisone cream, ondansetron (ZOFRAN) IV, sodium chloride flush, sodium chloride flush   Vital Signs    Vitals:   09/03/19 1955 09/04/19 0012 09/04/19 0357 09/04/19 0749  BP: (!) 152/76 137/71 138/81 115/79  Pulse: 89 81 88 (!) 124  Resp: 18 16 16 18   Temp: 98.4 F (36.9 C) 98.2 F (36.8 C) 99.3 F (37.4 C) 98.6 F (37 C)  TempSrc: Oral Oral Oral Oral  SpO2: 100% 100% 100% 100%  Weight:   74.5 kg   Height:        Intake/Output Summary (Last 24 hours) at 09/04/2019 0933 Last data filed at 09/04/2019 0420  Gross per 24 hour  Intake 873.93 ml  Output 1550 ml  Net -676.07 ml   Filed Weights   09/01/19 0630 09/02/19 0349 09/04/19 0357  Weight: 76.8 kg 76.7 kg 74.5 kg    Physical Exam    GEN- The patient is well appearing, alert and oriented x 3 today.   Head- normocephalic, atraumatic Eyes-  Sclera clear, conjunctiva pink Ears- hearing intact Oropharynx- clear Neck- supple Lungs- Clear to ausculation bilaterally, normal work of breathing Heart- Regular rate and rhythm, no murmurs, rubs or gallops GI- soft, NT, ND, + BS Extremities- no clubbing, cyanosis, or edema Skin- no rash or lesion Psych- euthymic mood, full affect Neuro- strength and sensation are intact  Labs    CBC Recent Labs    09/03/19 0458 09/03/19 0843 09/03/19 0848 09/04/19 0409  WBC 7.6  --   --  7.4  NEUTROABS  --   --   --  4.7  HGB 9.9*   < > 10.2*  10.2* 10.3*  HCT 32.0*   < > 30.0*  30.0* 32.1*  MCV 89.1  --   --  89.4  PLT 167  --   --  172   < > =  values in this interval not displayed.   Basic Metabolic Panel Recent Labs    09/03/19 0458 09/03/19 0843 09/03/19 0848 09/04/19 0409  NA 140   < > 141  137 139  K 3.9   < > 3.9  3.5 3.8  CL 102  --   --  103  CO2 29  --   --  27  GLUCOSE 108*  --   --  102*  BUN 23  --   --  16  CREATININE 1.45*  --   --  1.31*  CALCIUM 9.1  --   --  9.2  MG 2.1  --   --  1.9   < > = values in this interval not displayed.   Liver Function Tests Recent Labs    09/04/19 0409  AST 24  ALT 32  ALKPHOS 53  BILITOT 0.9  PROT 6.6  ALBUMIN 3.1*   No results for input(s): LIPASE, AMYLASE in the last 72 hours. Cardiac Enzymes No results for input(s): CKTOTAL, CKMB, CKMBINDEX, TROPONINI in the last 72 hours.   Telemetry    NSR, early this am appears to have had short periods of ? AF, now in Atrial flutter. Rates now 120-130s (personally reviewed)  Radiology    CARDIAC CATHETERIZATION  Result Date: 09/03/2019  2nd Mrg lesion is 99% stenosed.   Please see Dr. Clayborne Dana full cath report for details of the diagnostic catheterization. Unsuccessful attempt at PCI of the occluded OM branch. I was unable to cross the lesion with a wire. No balloon angioplasty performed. Discussed case with Dr. Haroldine Laws. Will load with Plavix and continue ASA.   CARDIAC CATHETERIZATION  Result Date: 09/03/2019  Ost RCA to Prox RCA lesion is 100% stenosed.  Previously placed Prox LAD to Mid LAD stent (unknown type) is widely patent.  1st Diag lesion is 90% stenosed.  Ost LAD lesion is 30% stenosed.  Prox Cx to Mid Cx lesion is 50% stenosed.  Ost Cx to Prox Cx lesion is 40% stenosed.  2nd Mrg lesion is 99% stenosed.  Findings: Ao = 116/58 (79) LV = 118/10 RA = 10 RV = 43/8 PA = 45/19 (26) PCW = 14 Fick cardiac output/index = 5.5/2.9 PVR = 2.2 Ao sat = 99% PA sat = 65%, 65% Assessment: 1.Significant CAD with left dominant system 2. Separate ostia for LAD & LCx 3. LAD with widely patent proximal sten. Mild non-obstructive CAD 4. LCX large dominant vessel with moderate diffuse disease. Culprit lesion appears to be dissected OM-2 branch with 99% lesion 5. RCA small non-dominant that is totally occluded 6. LVEF recovered at 55% 7. Well-compensated hemodynamics. Plan/Discussion: Suspect culprit lesion is OM-2 will attempt PCI if amenable. Otherwise medical therapy. If unable to revascularize, consider LifeVest on d/c. Glori Bickers, MD 9:25 AM    Patient Profile     Peter Lambert is a 74 y.o. male with a history of DM2, HTN, PAD s/p aorto-bifem graft, CAD s/p NSTEM and stent in 05/2008, and COPD who is being seen today for the evaluation of aborted sudden cardiac death/VF arrest at the request of Dr. Haroldine Laws .  Assessment & Plan    1. VF arrest/Aborted sudden cardiac death - Thought to be ischemic in setting of OM-2 as likely lesion. Failed PCI, plan medical therapy - EF 20% on arrival, improved to 55% on LV gram 09/03/2019 Continue amiodarone, transitioned  to drip for load with new AF/flutter overnight.  Continue coreg 3.125 mg BID. Titrate as tolerated  We reviewed no driving x 6 months Plan ICD this afternoon. Now plan dual chamber device with atrial arryhtmias overnight.  2. Typical appearing Atrial flutter / Atrial fib? In atrial flutter (new) this am, with strips that appear to be atrial fib as well on telemetry Will plan dual chamber ICD this afternoon with DCCV during the case. Will plan on starting on DOAC once wound site stable.   3. NSTEMI with known CAD and LAD stent 4/10 - HStrop peaked at 3894  -Cath with OM-2 99% lesions thought to be culprit of arrest but that is not able to be re-vascularized.  - Discussed with Dr. Haroldine Laws, now with atrial arrhythmias, can forego plavix and start NOAC when stable from post op perspective.   4. Acute systolic HF due to severe biventricular dysfunction - Echo 08/30/19 EF < 20% RV severely HKPersonally reviewed - Echo 2/21 EF 50-55% -EF improved post arrest 20% -> 55% - HF team following -> to hand off to [facility] with EF improvement post arrest. Likely stunning.   5. Hypokalemia/Hypomagnesemia K 3.8 and Mg 1.9 today. Will supp both with atrial arrhythmias.   6. AKI on CKD 3b - Returned to baseline ~ 1.3.   7. Bilateral aspiration PNA - PCT 3.97. On ceftriaxone - Per primary  8. PAD - s/p aortobifem graft  9. Anemia - Hgb 10.3 today.   For questions or updates, please contact [place] Please consult [email or web address] for contact info under Cardiology/STEMI.  Signed, Shirley Friar, PA-C  09/04/2019, 9:33 AM   \ EP Attending  Patient seen and examined. Agree with the findings as noted above. Discussed with Dr. Greggory Brandy. He has a VF arrest in the setting of CAD but no reversible causes. He has developed atrial fib/flutter and spontaneously reverted back to NSR. He was asymptomatic. We will place a DDD ICD. I have discussed the indications/risks/benefits/goals/expectations  of ICD insertion and he wishes to proceed.  Salome Spotted.

## 2019-09-04 NOTE — Care Management (Signed)
09-04-19 Benefits check submitted for Apixaban 5 mg BID cost. Case Manager will follow for cost Graves-Bigelow, Ocie Cornfield, RN,BSN Case Manager

## 2019-09-04 NOTE — TOC Benefit Eligibility Note (Signed)
Transition of Care [facility]) Benefit Eligibility Note    Patient Details  Name: Peter Lambert MRN: [id number] Date of Birth: [birth year]   Medication/Dose: Arne [place]  5 MG BID  Covered?: Yes  Tier: 3 Drug  Prescription Coverage Preferred Pharmacy: [facility]  Spoke with Person/Company/Phone Number:: COURTNEY  @  [employer] # [phone] OPT- 2  Co-Pay: $45.00  Prior Approval: No  Deductible:  (NO DEDUCTIBLE WITH PLAN)  Additional Notes: APIXABAN 5 MG BID : NON-FORMULARY    Memory Argue Phone Number: 09/04/2019, 3:31 PM

## 2019-09-04 NOTE — Progress Notes (Signed)
OT Cancellation Note  Patient Details Name: Peter Lambert MRN: [id number] DOB: 1945-09-01   Cancelled Treatment:    Reason Eval/Treat Not Completed: Patient at procedure or test/ unavailable  Daiva Huge Lor[place]-COTA/L 09/04/2019, 1:13 PM

## 2019-09-04 NOTE — Progress Notes (Signed)
Will hold ambulation as RN is starting IV amio. Yves Dill CES, ACSM 9:54 AM 09/04/2019

## 2019-09-04 NOTE — Progress Notes (Signed)
   Per Dr. Lovena Le  Would start Eliquis Monday Morning.   Would avoid heparin.   Legrand Como [address]" [place], PA-C  09/04/2019 3:44 PM

## 2019-09-04 NOTE — Progress Notes (Signed)
I was starting to get the IV amio running through patients IV and as I was about to push start, it appeared patient converted to NSR with PVC. I never initiated the drip, pushed pause and Oneida Arenas D was outside of the room. She called Aldean Baker D with Heart failure and notified her to see what Dr. Haroldine Laws wanted to do. I called to confirm I had a EKG confirming and she stated she would d/c the IV amio. I notified Oda Kilts of the about situation as well and let him know patient was having PVCs. Also patient was complaining of chest soreness. He stated patient had 45 minutes of CPR and that was to be expected

## 2019-09-04 NOTE — Plan of Care (Signed)

## 2019-09-04 NOTE — Progress Notes (Signed)
Orders for IV amio received. I wanted to clarify because night shift RN gave patient his PO amio early when heart rate started to get high. I asked Oneida Arenas D on the floor and she clarified with Maudie Mercury that is working with Heart Failure and they both stated it was ok

## 2019-09-05 ENCOUNTER — Inpatient Hospital Stay (HOSPITAL_COMMUNITY): Payer: PPO

## 2019-09-05 DIAGNOSIS — J9601 Acute respiratory failure with hypoxia: Secondary | ICD-10-CM

## 2019-09-05 DIAGNOSIS — I5021 Acute systolic (congestive) heart failure: Secondary | ICD-10-CM

## 2019-09-05 DIAGNOSIS — Z7901 Long term (current) use of anticoagulants: Secondary | ICD-10-CM

## 2019-09-05 DIAGNOSIS — B999 Unspecified infectious disease: Secondary | ICD-10-CM

## 2019-09-05 DIAGNOSIS — D638 Anemia in other chronic diseases classified elsewhere: Secondary | ICD-10-CM

## 2019-09-05 LAB — GLUCOSE, CAPILLARY
Glucose-Capillary: 129 mg/dL — ABNORMAL HIGH (ref 70–99)
Glucose-Capillary: 184 mg/dL — ABNORMAL HIGH (ref 70–99)

## 2019-09-05 LAB — CBC
HCT: 29.7 % — ABNORMAL LOW (ref 39.0–52.0)
Hemoglobin: 9.1 g/dL — ABNORMAL LOW (ref 13.0–17.0)
MCH: 27.3 pg (ref 26.0–34.0)
MCHC: 30.6 g/dL (ref 30.0–36.0)
MCV: 89.2 fL (ref 80.0–100.0)
Platelets: 166 10*3/uL (ref 150–400)
RBC: 3.33 MIL/uL — ABNORMAL LOW (ref 4.22–5.81)
RDW: 15 % (ref 11.5–15.5)
WBC: 7.9 10*3/uL (ref 4.0–10.5)
nRBC: 0 % (ref 0.0–0.2)

## 2019-09-05 LAB — BASIC METABOLIC PANEL
Anion gap: 8 (ref 5–15)
BUN: 16 mg/dL (ref 8–23)
CO2: 27 mmol/L (ref 22–32)
Calcium: 8.8 mg/dL — ABNORMAL LOW (ref 8.9–10.3)
Chloride: 104 mmol/L (ref 98–111)
Creatinine, Ser: 1.36 mg/dL — ABNORMAL HIGH (ref 0.61–1.24)
GFR calc Af Amer: 59 mL/min — ABNORMAL LOW (ref >60)
GFR calc non Af Amer: 51 mL/min — ABNORMAL LOW (ref >60)
Glucose, Bld: 98 mg/dL (ref 70–99)
Potassium: 3.9 mmol/L (ref 3.5–5.1)
Sodium: 139 mmol/L (ref 135–145)

## 2019-09-05 LAB — MAGNESIUM: Magnesium: 1.9 mg/dL (ref 1.7–2.4)

## 2019-09-05 IMAGING — DX DG CHEST 2V
2 series · 2 of 2 positions shown · non-contrast
Comparison: [DATE]

CLINICAL DATA: Cardiac arrhythmia with pacemaker placement

EXAM:
CHEST - 2 VIEW

[chest lat]
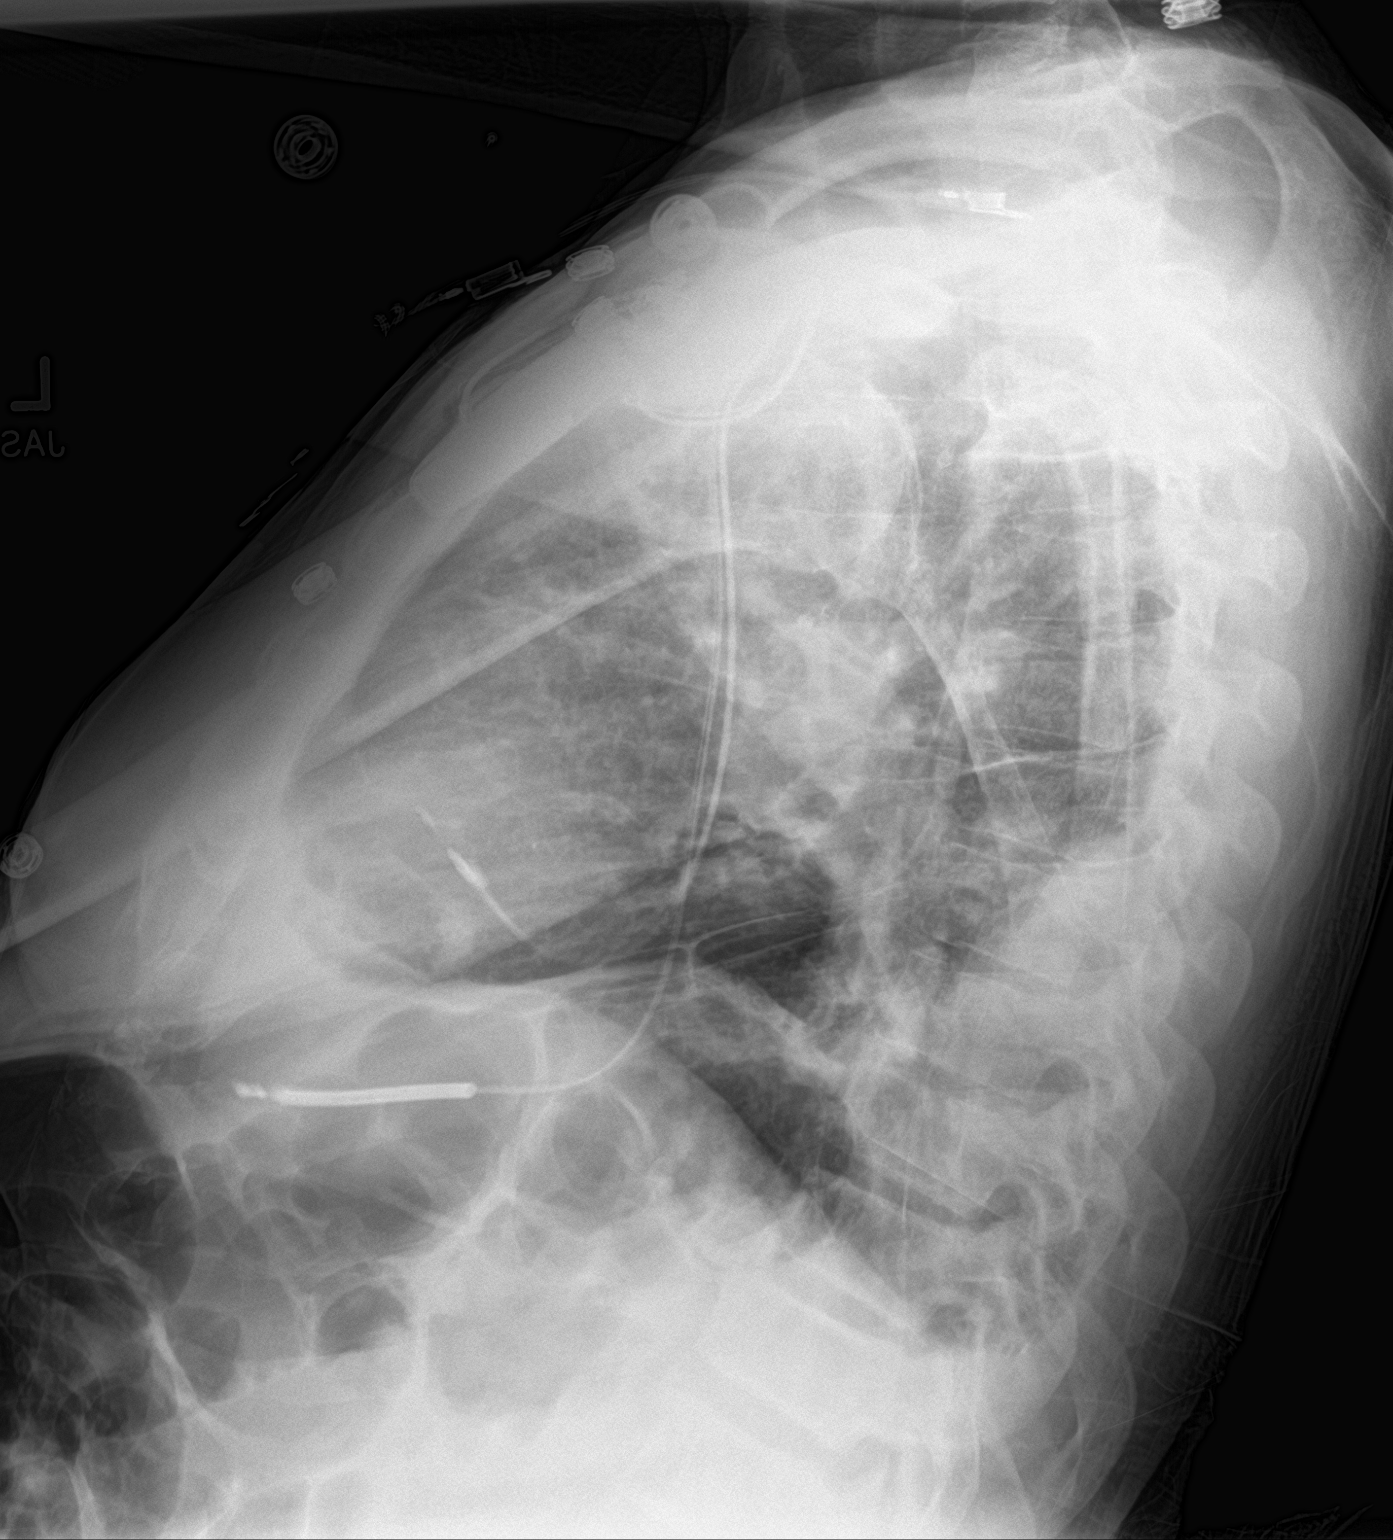

[chest ap]
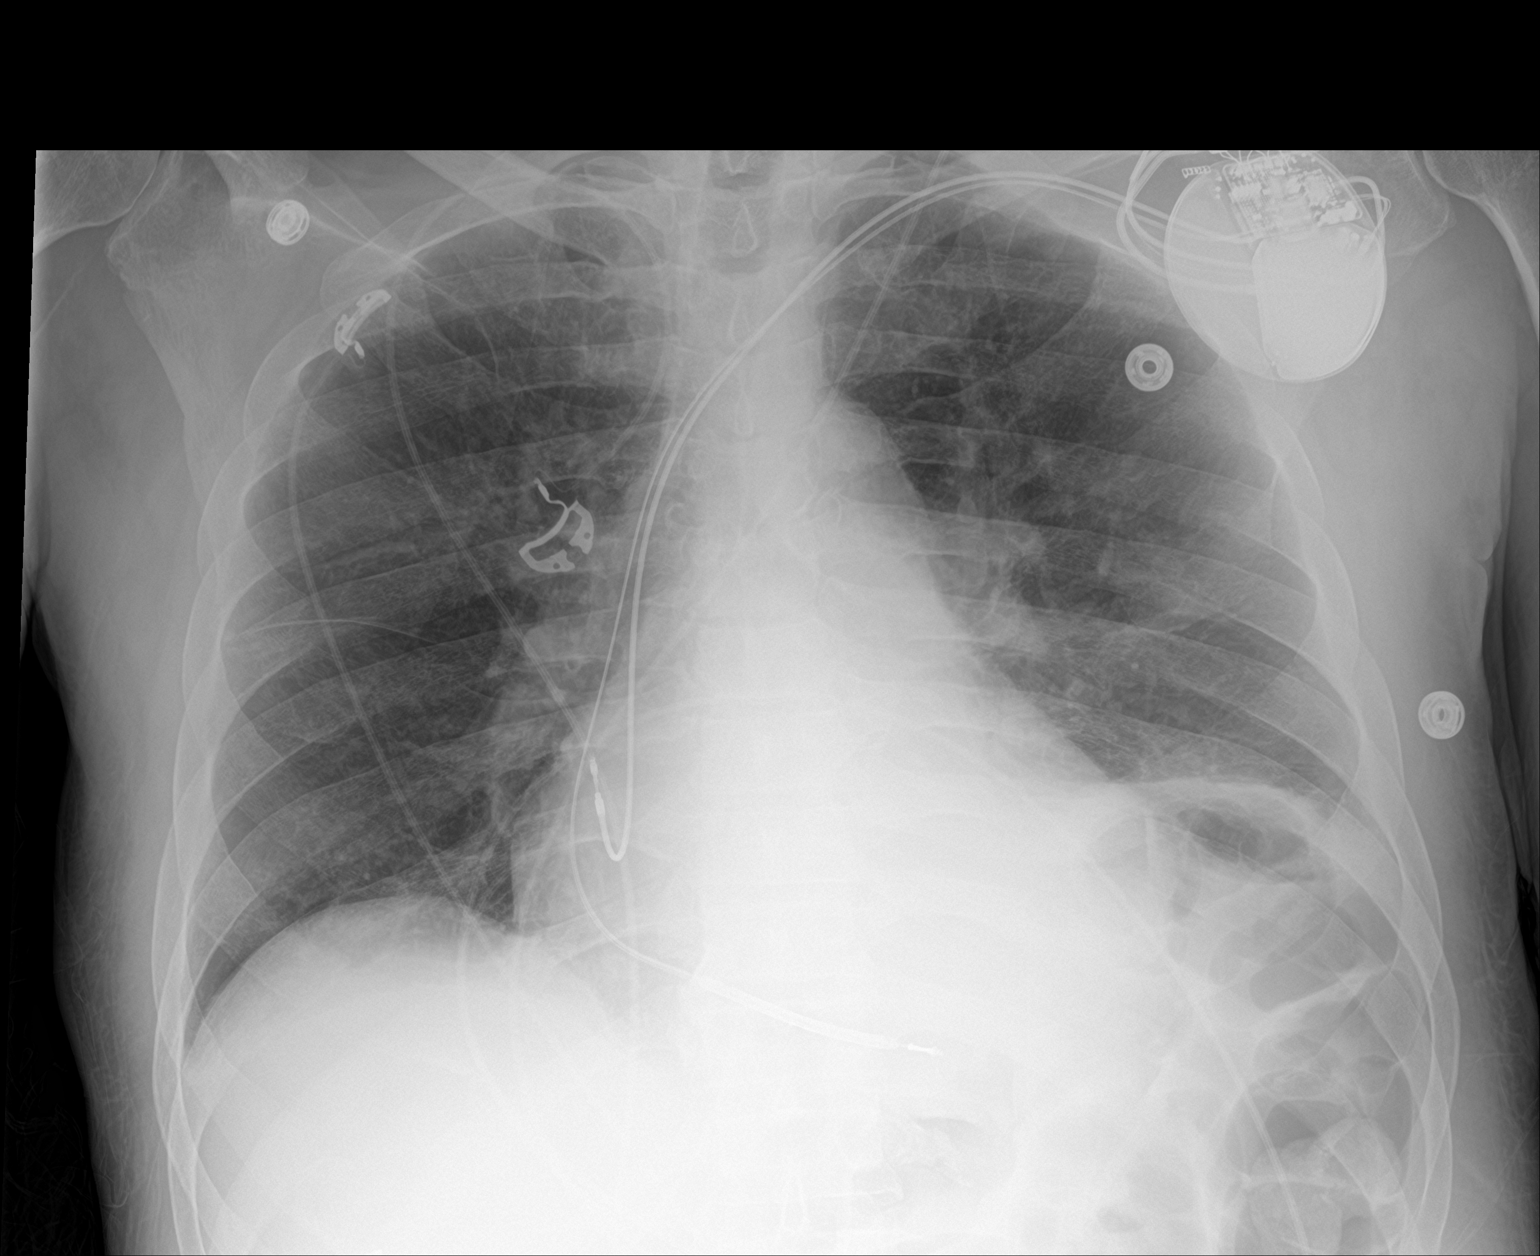

[2 of 2 positions shown; findings below may reference images not displayed]

FINDINGS: Pacemaker present on the left with lead tips attached to right
atrium and right ventricle. No pneumothorax. There is slight
elevation of the left hemidiaphragm with left base atelectasis and
apparent pleural effusion layering posteriorly. Lungs elsewhere are
clear. Heart is borderline enlarged with pulmonary vascularity
normal. No adenopathy. No bone lesions.
IMPRESSION: Pacemaker leads attached to right atrium right ventricle. No
pneumothorax. Left base atelectasis with apparent pleural effusion
posteriorly on the left. Lungs otherwise clear. Heart borderline
enlarged.

## 2019-09-05 MED ORDER — LOSARTAN POTASSIUM 25 MG PO TABS: 25.0000 mg | ORAL_TABLET | Freq: Every day | ORAL | 0 refills | Status: DC

## 2019-09-05 MED ORDER — ATORVASTATIN CALCIUM 80 MG PO TABS: 80.0000 mg | ORAL_TABLET | Freq: Every day | ORAL | 0 refills | Status: DC

## 2019-09-05 MED ORDER — APIXABAN 5 MG PO TABS: 5.0000 mg | ORAL_TABLET | Freq: Two times a day (BID) | ORAL | 0 refills | Status: DC

## 2019-09-05 MED ORDER — AMIODARONE HCL 200 MG PO TABS: ORAL_TABLET | ORAL | 0 refills | Status: DC

## 2019-09-05 MED ORDER — SPIRONOLACTONE 50 MG PO TABS: 50.0000 mg | ORAL_TABLET | Freq: Every day | ORAL | 0 refills | Status: DC

## 2019-09-05 MED ORDER — LOSARTAN POTASSIUM 25 MG PO TABS
25.0000 mg | ORAL_TABLET | Freq: Every day | ORAL | Status: DC
  Administered 2019-09-05: 25 mg via ORAL
  Filled 2019-09-05: qty 1

## 2019-09-05 MED ORDER — CARVEDILOL 3.125 MG PO TABS: 3.1250 mg | ORAL_TABLET | Freq: Two times a day (BID) | ORAL | 0 refills | Status: DC

## 2019-09-05 NOTE — Progress Notes (Signed)
CARDIAC REHAB PHASE I   PRE:  Rate/Rhythm: 53 SR with PVC  BP:  Supine:   Sitting: 123/66  Standing:    SaO2: 97% RA  MODE:  Ambulation: 330 ft   POST:  Rate/Rhythm: 86  BP:  Supine:   Sitting: 138/76  Standing:    SaO2: 94% RA  [id number] Patient tolerated ambulation well with assist x1, one standing rest break taken. Gait steady, no symptoms with ambulation. Assisted patient to chair after walk, legs elevated, call bell within reach. MI education completed with patient including restrictions, ASA/Eliquis use, CP, NTG use, and calling 911, risk factor modification, and activity progression. Patient given MI book, diabetic and heart healthy diet handout, tobacco cessation and exercise guidelines handouts. Patient states that he quit smoking at the beginning of the year and that his diabetes is under control. Patient very receptive and verbalized understanding of instructions given. Discussed phase 2 cardiac rehab, and patient is interested in the program at [facility], referral sent.  Sol Passer, MS, ACSM CEP

## 2019-09-05 NOTE — Discharge Instructions (Signed)
Amiodarone tablets What is this medicine? AMIODARONE (a MEE oh da rone) is an antiarrhythmic drug. It helps make your heart beat regularly. Because of the side effects caused by this medicine, it is only used when other medicines have not worked. It is usually used for heartbeat problems that may be life threatening. This medicine may be used for other purposes; ask your health care provider or pharmacist if you have questions. COMMON BRAND NAME(S): Cordarone, Pacerone What should I tell my health care provider before I take this medicine? They need to know if you have any of these conditions:  liver disease  lung disease  other heart problems  thyroid disease  an unusual or allergic reaction to amiodarone, iodine, other medicines, foods, dyes, or preservatives  pregnant or trying to get pregnant  breast-feeding How should I use this medicine? Take this medicine by mouth with a glass of water. Follow the directions on the prescription label. You can take this medicine with or without food. However, you should always take it the same way each time. Take your doses at regular intervals. Do not take your medicine more often than directed. Do not stop taking except on the advice of your doctor or health care professional. A special MedGuide will be given to you by the pharmacist with each prescription and refill. Be sure to read this information carefully each time. Talk to your pediatrician regarding the use of this medicine in children. Special care may be needed. Overdosage: If you think you have taken too much of this medicine contact a poison control center or emergency room at once. NOTE: This medicine is only for you. Do not share this medicine with others. What if I miss a dose? If you miss a dose, take it as soon as you can. If it is almost time for your next dose, take only that dose. Do not take double or extra doses. What may interact with this medicine? Do not take this  medicine with any of the following medications:  abarelix  apomorphine  arsenic trioxide  certain antibiotics like erythromycin, gemifloxacin, levofloxacin, pentamidine  certain medicines for depression like amoxapine, tricyclic antidepressants  certain medicines for fungal infections like fluconazole, itraconazole, ketoconazole, posaconazole, voriconazole  certain medicines for irregular heart beat like disopyramide, dronedarone, ibutilide, propafenone, sotalol  certain medicines for malaria like chloroquine, halofantrine  cisapride  droperidol  haloperidol  [place]  maprotiline  methadone  phenothiazines like chlorpromazine, mesoridazine, thioridazine  pimozide  ranolazine  red yeast rice  vardenafil This medicine may also interact with the following medications:  antiviral medicines for HIV or AIDS  certain medicines for blood pressure, heart disease, irregular heart beat  certain medicines for cholesterol like atorvastatin, cerivastatin, lovastatin, simvastatin  certain medicines for hepatitis C like sofosbuvir and ledipasvir; sofosbuvir  certain medicines for seizures like phenytoin  certain medicines for thyroid problems  certain medicines that treat or prevent blood clots like warfarin  cholestyramine  cimetidine  clopidogrel  cyclosporine  dextromethorphan  diuretics  dofetilide  fentanyl  general anesthetics  grapefruit juice  lidocaine  loratadine  methotrexate  other medicines that prolong the QT interval (cause an abnormal heart rhythm)  procainamide  quinidine  rifabutin, rifampin, or rifapentine  St. John's Wort  trazodone  ziprasidone This list may not describe all possible interactions. Give your health care provider a list of all the medicines, herbs, non-prescription drugs, or dietary supplements you use. Also tell them if you smoke, drink alcohol, or use illegal drugs.  Some items may interact with your  medicine. What should I watch for while using this medicine? Your condition will be monitored closely when you first begin therapy. Often, this drug is first started in a hospital or other monitored health care setting. Once you are on maintenance therapy, visit your doctor or health care professional for regular checks on your progress. Because your condition and use of this medicine carry some risk, it is a good idea to carry an identification card, necklace or bracelet with details of your condition, medications, and doctor or health care professional. Peter Lambert may get drowsy or dizzy. Do not drive, use machinery, or do anything that needs mental alertness until you know how this medicine affects you. Do not stand or sit up quickly, especially if you are an older patient. This reduces the risk of dizzy or fainting spells. This medicine can make you more sensitive to the sun. Keep out of the sun. If you cannot avoid being in the sun, wear protective clothing and use sunscreen. Do not use sun lamps or tanning beds/booths. You should have regular eye exams before and during treatment. Call your doctor if you have blurred vision, see halos, or your eyes become sensitive to light. Your eyes may get dry. It may be helpful to use a lubricating eye solution or artificial tears solution. If you are going to have surgery or a procedure that requires contrast dyes, tell your doctor or health care professional that you are taking this medicine. What side effects may I notice from receiving this medicine? Side effects that you should report to your doctor or health care professional as soon as possible:  allergic reactions like skin rash, itching or hives, swelling of the face, lips, or tongue  blue-gray coloring of the skin  blurred vision, seeing blue green halos, increased sensitivity of the eyes to light  breathing problems  chest pain  dark urine  fast, irregular heartbeat  feeling faint or  light-headed  intolerance to heat or cold  nausea or vomiting  pain and swelling of the scrotum  pain, tingling, numbness in feet, hands  redness, blistering, peeling or loosening of the skin, including inside the mouth  spitting up blood  stomach pain  sweating  unusual or uncontrolled movements of body  unusually weak or tired  weight gain or loss  yellowing of the eyes or skin Side effects that usually do not require medical attention (report to your doctor or health care professional if they continue or are bothersome):  change in sex drive or performance  constipation  dizziness  headache  loss of appetite  trouble sleeping This list may not describe all possible side effects. Call your doctor for medical advice about side effects. You may report side effects to FDA at 1-800-FDA-1088. Where should I keep my medicine? Keep out of the reach of children. Store at room temperature between 20 and 25 degrees C (68 and 77 degrees F). Protect from light. Keep container tightly closed. Throw away any unused medicine after the expiration date. NOTE: This sheet is a summary. It may not cover all possible information. If you have questions about this medicine, talk to your doctor, pharmacist, or health care provider.  2020 Elsevier/Gold Standard (2018-01-15 13:44:04) Atrial Flutter  Atrial flutter is a type of abnormal heart rhythm (arrhythmia). The heart has an electrical system that tells it how to beat. In atrial flutter, the signals move rapidly in the top chambers of the heart (the atria). This  makes your heart beat very fast. Atrial flutter can come and go, or it can be permanent. The goal of treatment is to prevent blood clots from forming, control your heart rate, or restore your heartbeat to a normal rhythm. If this condition is not treated, it can cause serious problems, such as a weakened heart muscle (cardiomyopathy) or a stroke. What are the causes? This  condition is often caused by conditions that damage the heart's electrical system. These include:  Heart conditions and heart surgery. These include heart attacks and open-heart surgery.  Lung problems, such as COPD or a blood clot in the lung (pulmonary embolism, or PE).  Poorly controlled high blood pressure (hypertension).  Overactive thyroid (hyperthyroidism).  Diabetes. In some cases, the cause of this condition is not known. What increases the risk? You are more likely to develop this condition if:  You are an elderly adult.  You are a man.  You are overweight (obese).  You have obstructive sleep apnea.  You have a family history of atrial flutter.  You have diabetes.  You drink a lot of alcohol, especially binge drinking.  You use drugs, including cannabis.  You smoke. What are the signs or symptoms? Symptoms of this condition include:  A feeling that your heart is pounding or racing (palpitations).  Shortness of breath.  Chest pain.  Feeling dizzy or light-headed.  Fainting.  Low blood pressure (hypotension).  Fatigue.  Tiring easily during exercise or activity. In some cases, there are no symptoms. How is this diagnosed? This condition may be diagnosed with:  An electrocardiogram (ECG) to check electrical signals of the heart.  An ambulatory cardiac monitor to record your heart's activity for a few days.  An echocardiogram to create pictures of your heart.  A transesophageal echocardiogram (TEE) to create even better pictures of your heart.  A stress test to check your blood supply while you exercise.  Imaging tests, such as a CT scan or chest X-ray.  Blood tests. How is this treated? Treatment depends on underlying conditions and how you feel when you experience atrial flutter. This condition may be treated with:  Medicines to prevent blood clots or to treat heart rate or heart rhythm problems.  Electrical cardioversion to reset the  heart's rhythm.  Ablation to remove the heart tissue that sends abnormal signals.  Left atrial appendage closure to seal the area where blood clots can form. In some cases, underlying conditions will be treated. Follow these instructions at home: Medicines  Take over-the-counter and prescription medicines only as told by your health care provider.  Do not take any new medicines without talking to your health care provider.  If you are taking blood thinners: ? Talk with your health care provider before you take any medicines that contain aspirin or NSAIDs, such as ibuprofen. These medicines increase your risk for dangerous bleeding. ? Take your medicine exactly as told, at the same time every day. ? Avoid activities that could cause injury or bruising, and follow instructions about how to prevent falls. ? Wear a medical alert bracelet or carry a card that lists what medicines you take. Lifestyle  Eat heart-healthy foods. Talk with a dietitian to make an eating plan that is right for you.  Do not use any products that contain nicotine or tobacco, such as cigarettes, e-cigarettes, and chewing tobacco. If you need help quitting, ask your health care provider.  Do not drink alcohol.  Do not use drugs, including cannabis.  Lose weight  if you are overweight or obese.  Exercise regularly as instructed by your health care provider. General instructions  Do not use diet pills unless your health care provider approves. Diet pills may make heart problems worse.  If you have obstructive sleep apnea, manage your condition as told by your health care provider.  Keep all follow-up visits as told by your health care provider. This is important. Contact a health care provider if you:  Notice a change in the rate, rhythm, or strength of your heartbeat.  Are taking a blood thinner and you notice more bruising.  Have a sudden change in weight.  Tire more easily when you exercise or do heavy  work. Get help right away if you have:  Pain or pressure in your chest.  Shortness of breath.  Fainting.  Increasing sweating with no known cause.  Side effects of blood thinners, such as blood in your vomit, stool, or urine, or bleeding that cannot stop.  Any symptoms of a stroke. "BE FAST" is an easy way to remember the main warning signs of a stroke: ? B - Balance. Signs are dizziness, sudden trouble walking, or loss of balance. ? E - Eyes. Signs are trouble seeing or a sudden change in vision. ? F - Face. Signs are sudden weakness or numbness of the face, or the face or eyelid drooping on one side. ? A - Arms. Signs are weakness or numbness in an arm. This happens suddenly and usually on one side of the body. ? S - Speech. Signs are sudden trouble speaking, slurred speech, or trouble understanding what people say. ? T - Time. Time to call emergency services. Write down what time symptoms started.  Other signs of a stroke, such as: ? A sudden, severe headache with no known cause. ? Nausea or vomiting. ? Seizure.  These symptoms may represent a serious problem that is an emergency. Do not wait to see if the symptoms will go away. Get medical help right away. Call your local emergency services (911 in the [place] Do not drive yourself to the hospital. Summary  Atrial flutter is an abnormal heart rhythm that can give you symptoms of palpitations, shortness of breath, or fatigue.  Atrial flutter is often treated with medicines to keep your heart in a normal rhythm and to prevent a stroke.  Get help right away if you cannot catch your breath, or have chest pain or pressure.  Get help right away if you have signs or symptoms of a stroke. This information is not intended to replace advice given to you by your health care provider. Make sure you discuss any questions you have with your health care provider. Document Revised: 08/06/2018 Document Reviewed: 08/06/2018 Elsevier Patient  Education  [place]. Ventricular Fibrillation  Ventricular fibrillation, also called V-fib, is a type of abnormal heart rhythm (arrhythmia) that affects the lower chambers of the heart (ventricles). When V-fib happens, disorganized electrical signals in the heart cause the ventricles to quiver instead of pumping blood normally. As a result, oxygen does not reach the brain and other important organs, and the heart suddenly stops beating. Ventricular fibrillation is a life-threatening emergency and must be treated right away. What are the causes? This condition happens when the electrical signals that tell the heart to beat become disorganized. It can be caused by:  Lack of blood flow to the heart.  Diseases of the heart muscle (cardiomyopathy).  A damaged heart muscle caused by a heart attack.  Severe body infection (sepsis).  Problems with the main artery that leads blood away from the heart to the rest of the body (aorta).  Medicines.  Electrical shock.  Electrolyte abnormalities. What increases the risk? You are more likely to develop this condition if:  You are 41-71 years of age.  You have high blood pressure (hypertension).  You have diabetes.  You smoke.  You are male.  You have heart failure or a disease of the heart muscle (cardiomyopathy).  You have had a heart attack.  You have heart defects that you were born with (congenital heart defects).  You have an infection that affects the heart.  You have coronary artery disease.  You have certain genetic traits. What are the signs or symptoms? The main symptom of this condition is a sudden stop of the heartbeat (cardiac arrest). When this happens, you faint and become unresponsive. You will not be breathing normally and you will have no pulse. To survive, you must receive urgent treatment.  Before cardiac arrest, you may experience:  Fainting.  A rapid heartbeat.  Dizziness or  light-headedness.  Shortness of breath.  Chest pain.  Nausea or vomiting. How is this diagnosed? This condition is diagnosed based on:  Your symptoms and medical history.  A physical exam.  An electrocardiogram (ECG). This test is done to check for problems with electrical activity in the heart.  A coronary angiogram. During this test, a dye is injected into your bloodstream. The dye shows up on an X-ray and shows how blood flows through the arteries that lead to the heart. How is this treated? CPR (cardiopulmonary resuscitation) should be started immediately. As soon as an automatic external defibrillator (AED) is available, apply the AED and follow the instructions. This condition is a medical emergency that must be treated immediately. If the arrest happens in the hospital, staff will use a defibrillator to restart the heart. After the heart is restarted, treatment focuses on recovery and preventing V-fib. It may include:  Medicines that return the heart to a normal rhythm (anti-arrhythmics).  Treating any underlying conditions that may cause V-fib.  Cooling the body (therapeutic hypothermia). This can help decrease the risk of brain injury.  Having an implantable cardioverter defibrillator (ICD) inserted in your chest. An ICD is a small device that monitors your heartbeat. When it senses an irregular heartbeat, it sends a shock to bring the heartbeat back to normal. Follow these instructions at home: Medicines  Take over-the-counter and prescription medicines as told by your health care provider. Eating and drinking  Eat a healthy diet. This includes lean meats, whole grains, low-fat or fat-free dairy products, and plenty of fruits and vegetables.  Avoid eating foods that are high in saturated fat, trans fat, sugar, or salt (sodium).  Limit alcohol intake to no more than 1 drink per day for nonpregnant women and 2 drinks per day for men. One drink equals 12 oz of beer, 5 oz  of wine, or 1 oz of hard liquor. Activity  Rest as told by your health care provider.  Exercise regularly. Ask your health care provider what exercises are safe for you. Aim for 150 minutes of moderate exercise or 75 minutes of vigorous exercise per week. Lifestyle  Do not use any products that contain nicotine or tobacco, such as cigarettes and e-cigarettes. If you need help quitting, ask your health care provider.  Maintain a healthy weight. Ask your health care provider what weight would be healthy for you.  Manage stress. Try to do this with relaxation exercises, yoga, quiet time, or meditation.  Ask your health care provider when it is safe for you to drive. If you have an ICD:  Avoid spending long periods of time with electronic devices that have strong magnetic fields, such as cell phones, microwaves, or metal detectors.  Tell all your health care providers, including dentists, that you have an ICD.  If you are flying, tell airport security that you have an ICD.  Consider wearing a medical ID bracelet or necklace stating that you have an ICD.  At all times, carry the card you were given that has information about your ICD.  Ask your health care provider what activities are safe for you. General instructions  Keep all follow-up visits as told by your health care provider. This is important. These include visits with your heart specialist. Contact a health care provider if:  You have had a shock (discharge)from your ICD. Get help right away if:  You have chest pain.  You have shortness of breath.  You have a rapid heartbeat.  You faint.  You feel dizzy or light-headed. These symptoms represent a serious problem that is an emergency. Do not wait to see if the symptoms will go away. Get medical help right away. Call your local emergency services (911 in the [place] Do not drive yourself to the hospital. Summary  Ventricular fibrillation, also called V-fib, is a type  of abnormal heart rhythm (arrhythmia) that affects the lower chambers of the heart (ventricles).  When V-fib happens, disorganized electrical signals in the heart cause the ventricles to quiver instead of pumping blood normally.  Ventricular fibrillation is a life-threatening emergency and must be treated right away.  The main symptom of this condition is a sudden stop of the heartbeat (cardiac arrest).  After a heartbeat is restored, this condition may be treated with medicines or an implantable cardioverter defibrillator (ICD). This information is not intended to replace advice given to you by your health care provider. Make sure you discuss any questions you have with your health care provider. Document Revised: 01/25/2017 Document Reviewed: 06/07/2016 Elsevier Patient Education  [place]. After Your ICD (Implantable Cardiac Defibrillator)   . You have a Chemical engineer ICD  ACTIVITY . Do not lift your arm above shoulder height for 1 week after your procedure. After 7 days, you may progress as below.     Friday September 11, 2019  Saturday September 12, 2019 Sunday September 13, 2019 Monday September 14, 2019   . Do not lift, push, pull, or carry anything over 10 pounds with the affected arm until 6 weeks (Friday October 16, 2019 ) after your procedure.   . Do NOT DRIVE until you have been seen for your wound check, or as long as instructed by your healthcare provider.   . Ask your healthcare provider when you can go back to work   INCISION/Dressing . We will tell you which day to start your blood thinner.   . Monitor your defibrillator site for redness, swelling, and drainage. Call the device clinic at [phone] if you experience these symptoms or fever/chills.  . If your incision is sealed with Steri-strips or staples, you may shower 10 days after your procedure or when told by your provider. Do not remove the steri-strips or let the shower hit directly on your site. You may wash  around your site with soap and water.    Marland Kitchen Avoid lotions, ointments, or perfumes  over your incision until it is well-healed.  . You may use a hot tub or a pool AFTER your wound check appointment if the incision is completely closed.  . Your ICD is designed to protect you from life threatening heart rhythms. Because of this, you may receive a shock.   o 1 shock with no symptoms:  Call the office during business hours. o 1 shock with symptoms (chest pain, chest pressure, dizziness, lightheadedness, shortness of breath, overall feeling unwell):  Call 911. o If you experience 2 or more shocks in 24 hours:  Call 911. o If you receive a shock, you should not drive for 6 months per the  DMV IF you receive appropriate therapy from your ICD.   . ICD Alerts:  Some alerts are vibratory and others beep. These are NOT emergencies. Please call our office to let us know. If this occurs at night or on weekends, it can wait until the next business day. Send a remote transmission.  . If your device is capable of reading fluid status (for heart failure), you will be offered monthly monitoring to review this with you.   DEVICE MANAGEMENT . Remote monitoring is used to monitor your ICD from home. This monitoring is scheduled every 91 days by our office. It allows [place] to keep an eye on the functioning of your device to ensure it is working properly. You will routinely see your Electrophysiologist annually (more often if necessary).   . You should receive your ID card for your new device in 4-8 weeks. Keep this card with you at all times once received. Consider wearing a medical alert bracelet or necklace.  . Your ICD  may be MRI compatible. This will be discussed at your next office visit/wound check.  You should avoid contact with strong electric or magnetic fields.    Do not use amateur (ham) radio equipment or electric (arc) welding torches. MP3 player headphones with magnets should not be used. Some devices  are safe to use if held at least 12 inches (30 cm) from your defibrillator. These include power tools, lawn mowers, and speakers. If you are unsure if something is safe to use, ask your health care provider.   When using your cell phone, hold it to the ear that is on the opposite side from the defibrillator. Do not leave your cell phone in a pocket over the defibrillator.   You may safely use electric blankets, heating pads, computers, and microwave ovens.  Call the office right away if:  You have chest pain.  You feel more than one shock.  You feel more short of breath than you have felt before.  You feel more light-headed than you have felt before.  Your incision starts to open up.  This information is not intended to replace advice given to you by your health care provider. Make sure you discuss any questions you have with your health care provider.

## 2019-09-05 NOTE — Plan of Care (Signed)
  Problem: Education: Goal: Knowledge of General Education information will improve Description: Including pain rating scale, medication(s)/side effects and non-pharmacologic comfort measures Outcome: Adequate for Discharge   Problem: Health Behavior/Discharge Planning: Goal: Ability to manage health-related needs will improve Outcome: Adequate for Discharge   Problem: Clinical Measurements: Goal: Ability to maintain clinical measurements within normal limits will improve Outcome: Adequate for Discharge Goal: Will remain free from infection Outcome: Adequate for Discharge Goal: Diagnostic test results will improve Outcome: Adequate for Discharge Goal: Respiratory complications will improve Outcome: Adequate for Discharge Goal: Cardiovascular complication will be avoided Outcome: Adequate for Discharge   Problem: Activity: Goal: Risk for activity intolerance will decrease Outcome: Adequate for Discharge   Problem: Nutrition: Goal: Adequate nutrition will be maintained Outcome: Adequate for Discharge   Problem: Coping: Goal: Level of anxiety will decrease Outcome: Adequate for Discharge   Problem: Elimination: Goal: Will not experience complications related to bowel motility Outcome: Adequate for Discharge Goal: Will not experience complications related to urinary retention Outcome: Adequate for Discharge   Problem: Pain Managment: Goal: General experience of comfort will improve Outcome: Adequate for Discharge   Problem: Safety: Goal: Ability to remain free from injury will improve Outcome: Adequate for Discharge   Problem: Skin Integrity: Goal: Risk for impaired skin integrity will decrease Outcome: Adequate for Discharge   Problem: Education: Goal: Ability to manage disease process will improve Outcome: Adequate for Discharge   Problem: Cardiac: Goal: Ability to achieve and maintain adequate cardiopulmonary perfusion will improve Outcome: Adequate for  Discharge   Problem: Neurologic: Goal: Promote progressive neurologic recovery Outcome: Adequate for Discharge   Problem: Skin Integrity: Goal: Risk for impaired skin integrity will be minimized. Outcome: Adequate for Discharge   Problem: Education: Goal: Understanding of CV disease, CV risk reduction, and recovery process will improve Outcome: Adequate for Discharge Goal: Individualized Educational Video(s) Outcome: Adequate for Discharge   Problem: Activity: Goal: Ability to return to baseline activity level will improve Outcome: Adequate for Discharge   Problem: Cardiovascular: Goal: Ability to achieve and maintain adequate cardiovascular perfusion will improve Outcome: Adequate for Discharge Goal: Vascular access site(s) Level 0-1 will be maintained Outcome: Adequate for Discharge   Problem: Health Behavior/Discharge Planning: Goal: Ability to safely manage health-related needs after discharge will improve Outcome: Adequate for Discharge

## 2019-09-05 NOTE — TOC Transition Note (Signed)
Transition of Care [facility]) - CM/SW Discharge Note   Patient Details  Name: Peter Lambert MRN: [id number] Date of Birth: 1945/05/10  [facility] CM/SW Contact:  Claudie Leach, RN 09/05/2019, 2:10 PM   Clinical Narrative:    Patient to d/c today. He reports he does not need a walker and he will purchase one if needed. Patient referred to cardiac rehab and [facility] services.    Final next level of care: [place] Barriers to Discharge: Continued Medical Work up   Patient Goals and CMS Choice Patient states their goals for this hospitalization and ongoing recovery are:: continue to get better CMS Medicare.gov Compare Post Acute Care list provided to:: Patient Choice offered to / list presented to : Patient (disucssed via telephone)   Discharge Plan and Services In-house Referral: NA Discharge Planning Services: CM Consult Post Acute Care Choice: Home Health                    HH Arranged: PT, OT [facility] Agency: [place] Date [place]: 09/03/19 Time [place]: 0947 Representative spoke with at [place]: [place] Determinants of Health ([place]) Interventions     Readmission Risk Interventions Readmission Risk Prevention Plan 04/28/2019 04/27/2019  Transportation Screening - Complete  Home Care Screening - Complete  Medication Review (RN CM) - Complete  HRI or Home Care Consult Complete -  Social Work Consult for [place] Planning/Counseling Complete -  Palliative Care Screening Not Applicable -  Medication Review Press photographer) Complete -  Some recent data might be hidden

## 2019-09-05 NOTE — Progress Notes (Signed)
Electrophysiology Rounding Note  Patient Name: Peter Lambert Date of Encounter: 09/05/2019  Primary Cardiologist: Peter Burow, MD Electrophysiologist: None   Subjective   Feeling well today without acute complaints  Inpatient Medications    Scheduled Meds: . acetaminophen  650 mg Oral Q4H while awake  . amiodarone  200 mg Oral BID  . [START ON 09/07/2019] apixaban  5 mg Oral BID  . aspirin  81 mg Per Tube Daily  . atorvastatin  80 mg Per Tube Daily  . carvedilol  3.125 mg Oral BID WC  . docusate  100 mg Oral BID  . insulin aspart  0-9 Units Subcutaneous TID WC  . mouth rinse  15 mL Mouth Rinse BID  . melatonin  3 mg Oral QHS  . pantoprazole  40 mg Oral QHS  . polyethylene glycol  17 g Oral Daily  . sodium chloride flush  10-40 mL Intracatheter Q12H  . sodium chloride flush  3 mL Intravenous Q12H  . spironolactone  50 mg Oral Daily   Continuous Infusions: . sodium chloride    .  ceFAZolin (ANCEF) IV Stopped (09/05/19 0405)   PRN Meds: sodium chloride, acetaminophen, acetaminophen, albuterol, docusate sodium, hydrocortisone cream, ondansetron (ZOFRAN) IV, sodium chloride flush   Vital Signs    Vitals:   09/04/19 1755 09/05/19 0008 09/05/19 0337 09/05/19 0400  BP: (!) 144/86 132/68 122/75   Pulse: 79 83 78   Resp:  16 16   Temp:  99.3 F (37.4 C) 99 F (37.2 C)   TempSrc:  Oral Oral   SpO2: 96% 100% 100%   Weight:    75.3 kg  Height:        Intake/Output Summary (Last 24 hours) at 09/05/2019 0834 Last data filed at 09/05/2019 0417 Gross per 24 hour  Intake 105.35 ml  Output 650 ml  Net -544.65 ml   Filed Weights   09/02/19 0349 09/04/19 0357 09/05/19 0400  Weight: 76.7 kg 74.5 kg 75.3 kg    Physical Exam    GEN: Well nourished, well developed, in no acute distress  HEENT: normal  Neck: no JVD, carotid bruits, or masses Cardiac: RRR; no murmurs, rubs, or gallops,no edema  Respiratory:  clear to auscultation bilaterally, normal work of  breathing GI: soft, nontender, nondistended, + BS MS: no deformity or atrophy  Skin: warm and dry, device site well healed Neuro:  Strength and sensation are intact Psych: euthymic mood, full affect   Labs    CBC Recent Labs    09/04/19 0409 09/05/19 0339  WBC 7.4 7.9  NEUTROABS 4.7  --   HGB 10.3* 9.1*  HCT 32.1* 29.7*  MCV 89.4 89.2  PLT 172 825   Basic Metabolic Panel Recent Labs    09/04/19 0409 09/05/19 0339  NA 139 139  Lambert 3.8 3.9  CL 103 104  CO2 27 27  GLUCOSE 102* 98  BUN 16 16  CREATININE 1.31* 1.36*  CALCIUM 9.2 8.8*  MG 1.9 1.9   Liver Function Tests Recent Labs    09/04/19 0409  AST 24  ALT 32  ALKPHOS 53  BILITOT 0.9  PROT 6.6  ALBUMIN 3.1*   No results for input(s): LIPASE, AMYLASE in the last 72 hours. Cardiac Enzymes No results for input(s): CKTOTAL, CKMB, CKMBINDEX, TROPONINI in the last 72 hours.   Telemetry    Sinus rhythm, personally reviewed Radiology    DG Chest 2 View  Result Date: 09/05/2019 CLINICAL DATA:  Cardiac arrhythmia with pacemaker  placement EXAM: CHEST - 2 VIEW COMPARISON:  September 01, 2019 FINDINGS: Pacemaker present on the left with lead tips attached to right atrium and right ventricle. No pneumothorax. There is slight elevation of the left hemidiaphragm with left base atelectasis and apparent pleural effusion layering posteriorly. Lungs elsewhere are clear. Heart is borderline enlarged with pulmonary vascularity normal. No adenopathy. No bone lesions. IMPRESSION: Pacemaker leads attached to right atrium right ventricle. No pneumothorax. Left base atelectasis with apparent pleural effusion posteriorly on the left. Lungs otherwise clear. Heart borderline enlarged. Electronically Signed   By: Peter Lambert M.D.   On: 09/05/2019 08:12   CARDIAC CATHETERIZATION  Result Date: 09/03/2019  2nd Mrg lesion is 99% stenosed.  Please see Peter Lambert full cath report for details of the diagnostic catheterization.  Unsuccessful attempt at PCI of the occluded OM branch. I was unable to cross the lesion with a wire. No balloon angioplasty performed. Discussed case with Dr. Haroldine Lambert. Peter Lambert load with Plavix and continue ASA.   CARDIAC CATHETERIZATION  Result Date: 09/03/2019  Ost RCA to Prox RCA lesion is 100% stenosed.  Previously placed Prox LAD to Mid LAD stent (unknown type) is widely patent.  1st Diag lesion is 90% stenosed.  Ost LAD lesion is 30% stenosed.  Prox Cx to Mid Cx lesion is 50% stenosed.  Ost Cx to Prox Cx lesion is 40% stenosed.  2nd Mrg lesion is 99% stenosed.  Findings: Ao = 116/58 (79) LV = 118/10 RA = 10 RV = 43/8 PA = 45/19 (26) PCW = 14 Fick cardiac output/index = 5.5/2.9 PVR = 2.2 Ao sat = 99% PA sat = 65%, 65% Assessment: 1.Significant CAD with left dominant system 2. Separate ostia for LAD & LCx 3. LAD with widely patent proximal sten. Mild non-obstructive CAD 4. LCX large dominant vessel with moderate diffuse disease. Culprit lesion appears to be dissected OM-2 branch with 99% lesion 5. RCA small non-dominant that is totally occluded 6. LVEF recovered at 55% 7. Well-compensated hemodynamics. Plan/Discussion: Suspect culprit lesion is OM-2 Peter Lambert attempt PCI if amenable. Otherwise medical therapy. If unable to revascularize, consider LifeVest on d/c. Peter Bickers, MD 9:25 AM   EP PPM/ICD IMPLANT  Result Date: 09/04/2019 CONCLUSIONS:  1. VF cardiac arrest.  2. Successful ICD implantation.  3. DFT less than or equal to 21 joules.  4. No early apparent complications. ICD Criteria Current LVEF:55%. Within 12 months prior to implant: Yes Heart failure history: No Cardiomyopathy history: No. Atrial Fibrillation/Atrial Flutter: Yes, Paroxysmal. Ventricular tachycardia history: No. Cardiac arrest history: Yes, Ventricular Fibrillation. History of syndromes with risk of sudden death: No. Previous ICD: No. Current ICD indication: Secondary PPM indication: No.  Class I or II Bradycardia indication  present: No Beta Blocker therapy for 3 or more months: No, medical reason. Ace Inhibitor/ARB therapy for 3 or more months: No, medical reason. Peter [place], MD 1:42 PM 09/04/2019    Patient Profile     Peter Lambert is a 74 y.o. male with a history of DM2, HTN, PAD s/p aorto-bifem graft, CAD s/p NSTEM and stent in 05/2008, and COPD who is being seen today for the evaluation of aborted sudden cardiac death/VF arrest at the request of Dr. Haroldine Lambert .  Assessment & Plan    1. VF arrest/Aborted sudden cardiac death Thought ischemic in the setting of an L M2 occlusion with a failed PCI.  Ejection fraction is fortunately improved.  No driving per [facility] law for 6 months which was discussed  with patient.  We Peter Lambert continue with amiodarone, discharged with 400 mg twice daily for 2 weeks followed by 400 mg daily for 2 weeks followed by 200 mg a day.  Is now status post ICD.  Device functioning appropriately.  Chest x-ray is without issue.  Peter Lambert arrange for follow-up in EP clinic as well as device clinic.  2. Typical appearing Atrial flutter / Atrial fib? Peter Lambert need Eliquis for atrial flutter.  Would start on Monday.  This Peter Lambert allow for healing from his defibrillator implant.  CHA2DS2-VASc of at least 4.    3. NSTEMI with known CAD and LAD stent 4/10 High-sensitivity troponin peaked at 3894.  Catheterization with a 99% OM2 lesion.  We Peter Lambert plan for discharge on Eliquis as above.  4. Acute systolic HF due to severe biventricular dysfunction Echo initially with a severely reduced ejection fraction, though improved on LV gram.  Likely due to cardiac stunning.  Would plan for discharge on current doses of carvedilol.  Would add losartan as blood pressure allows.  5. Hypokalemia/Hypomagnesemia No supplementation today  6. AKI on CKD 3b Currently at baseline  7. Bilateral aspiration PNA On ceftriaxone per primary  8. PAD Status post aortobifemoral graft  9. Anemia Hemoglobin 9.1.  Plan  per primary team.  [facility] Sumaiyah Markert sign off.   Medication Recommendations: Amiodarone, carvedilol, losartan as above Other recommendations (labs, testing, etc): None Follow up as an outpatient: To be arranged in cardiology and device clinic   For questions or updates, please contact [place] Please consult [email or web address] for contact info under Cardiology/STEMI.  Signed, Chinaza Rooke Meredith Leeds, MD  09/05/2019, 8:34 AM

## 2019-09-05 NOTE — Discharge Summary (Signed)
Physician Discharge Summary  Peter Lambert SJG:[id number] DOB: 11-Nov-1945 DOA: 08/29/2019  PCP: Redmond School, MD  Admit date: 08/29/2019 Discharge date: 09/05/2019  Admitted From: Home Disposition:  Discharged to home with [facility]  Recommendations for Outpatient Follow-up:  1. Follow up with PCP in 1 week. 2. Please obtain BMP/CBC in one week 3. Follow up with cardiology in 1 week.   Discharge Condition: Stable  CODE STATUS: FULL   Brief/Interim Summary: Brief Narrative:   74 year old man with hx of CAD, COPD, admitted 7/3 after out of hospital Vfib cardiac arrest with shock x2, epi x5, amio and bicarb before ROSC, suspected sepsis/CAP. Unknown downtime, did not receive out of hospital CPR.  7/10: States he is feeling better today. Now on RA and stable. Says he has PRN O2 at home w/ concentrator. Will need to follow up with PCP in 1 week. Follow up with cardiology in 1 to 2 weeks. Will get referal to cardiac rehab. Will discharge with [place].    Discharge Diagnoses:  Principal Problem:   Cardiac arrest with ventricular fibrillation ([place]) Active Problems:   Peripheral arterial disease ([place])   Coronary artery disease s/p DES to LAD in 2010, s/p PCI to proximal LAD stenosis with DES this hospitalization   Essential hypertension   Hyperlipidemia   Acute respiratory failure with hypoxemia ([place])   Aspiration pneumonia ([place]), bilateral   Demand ischemia of myocardium (HCC)   AKI (acute kidney injury) ([place])   Hypokalemia   Hyperglycemia   Hypoalbuminemia   Elevated brain natriuretic peptide (BNP) level   Lactic acidosis   Leukocytosis   Proteinuria   Hypomagnesemia   NSTEMI (non-ST elevated myocardial infarction) (HCC)   CKD (chronic kidney disease), stage III b   PVC's (premature ventricular contractions)   NSVT (nonsustained ventricular tachycardia) (HCC)   Thrombocytopenia (HCC)   Anemia of infection and chronic disease   Severe sepsis with septic shock ([place]) suspected on  admission, source: ? aspiration   Acute metabolic encephalopathy   Acute systolic CHF (congestive heart failure) (HCC)   Anticoagulated on heparin   History of noncompliance with medical treatment  VF Cardiac Arrest Acute Systolic Heart Failure  A flutter - s/p prolonged out of hospital cardiac arrest. Unknown downtime. He was apparently asymptomatic prior to arrest, though exact details unclear. PNA/CAP and hypokalemia likely contributing. UDS negative. New reduction of LVEF (prior normal).  - coreg, spironolactone - cards onboard, now s/p RHC/LHC; Dissected OM-2 branch with 99% lesion noted on exam. EP consulted for ICD vs life vest.     - 7/10: Now s/p ICD, transitioned to PO amio: will taper per cards rec: "400 mg twice daily for 2 weeks followed by 400 mg daily for 2 weeks followed by 200 mg a day."; "Start eliquis Monday"  Acute Hypoxic Respiratory Failure  - In the setting of V-fib arrest and CAP. RVP negative.  - wean O2 for sats >90% - completed tx with rocephin     - 7/10: stable on RA  Acute Metabolic Encephalopathy  - Resolved  AKI on CKD III Hypokalemia - SCr stable at 1.36; K+ stable/resolved  Normocytic Anemia  Thrombocytopenia  - Hgb 9.1 today; plts resolved  COPD Hx of tobacco abuse - PRN albuterol  - Quit tobacco in January     - Has PRN O2 at home  DM II  - continue SSI; A1c is 6.9  Discharge Instructions  Discharge Instructions    Amb Referral to Cardiac Rehabilitation   Complete  by: As directed    Diagnosis: NSTEMI   After initial evaluation and assessments completed: Virtual Based Care may be provided alone or in conjunction with Phase 2 Cardiac Rehab based on patient barriers.: Yes     Allergies as of 09/05/2019   No Known Allergies     Medication List    STOP taking these medications   clopidogrel 75 MG tablet Commonly known as: PLAVIX   doxazosin 2 MG tablet Commonly known as:  CARDURA   ferrous sulfate 325 (65 FE) MG tablet   furosemide 20 MG tablet Commonly known as: Lasix   guaiFENesin 600 MG 12 hr tablet Commonly known as: MUCINEX   metolazone 2.5 MG tablet Commonly known as: ZAROXOLYN   metoprolol tartrate 25 MG tablet Commonly known as: LOPRESSOR   potassium chloride SA 20 MEQ tablet Commonly known as: Klor-Con M20     TAKE these medications   acetaminophen 325 MG tablet Commonly known as: TYLENOL Take 2 tablets (650 mg total) by mouth every 6 (six) hours as needed for mild pain, fever or headache (or Fever >/= 101).   albuterol 108 (90 Base) MCG/ACT inhaler Commonly known as: VENTOLIN HFA Inhale 2 puffs into the lungs every 4 (four) hours as needed for wheezing or shortness of breath.   amiodarone 200 MG tablet Commonly known as: PACERONE Take 2 tablets (400 mg total) by mouth 2 (two) times daily for 14 days, THEN 2 tablets (400 mg total) daily for 14 days, THEN 1 tablet (200 mg total) daily. Start taking on: September 05, 2019   apixaban 5 MG Tabs tablet Commonly known as: ELIQUIS Take 1 tablet (5 mg total) by mouth 2 (two) times daily. Start taking on: September 07, 2019   aspirin EC 81 MG tablet Take 1 tablet (81 mg total) by mouth daily with breakfast.   atorvastatin 80 MG tablet Commonly known as: LIPITOR Place 1 tablet (80 mg total) into feeding tube daily. Start taking on: September 06, 2019 What changed:   medication strength  how much to take  how to take this   carvedilol 3.125 MG tablet Commonly known as: COREG Take 1 tablet (3.125 mg total) by mouth 2 (two) times daily with a meal.   colchicine 0.6 MG tablet Take 0.6 mg by mouth daily.   losartan 25 MG tablet Commonly known as: COZAAR Take 1 tablet (25 mg total) by mouth daily. Start taking on: September 06, 2019   nitroGLYCERIN 0.4 MG SL tablet Commonly known as: NITROSTAT PLACE 1 TABLET UNDER THE TONGUE EVERY 5 MINUTES AS NEEDED FOR CHEST PAIN What changed: See the new  instructions.   polyethylene glycol 17 g packet Commonly known as: MIRALAX / GLYCOLAX Take 17 g by mouth daily. For bowel What changed:   when to take this  reasons to take this  additional instructions   spironolactone 50 MG tablet Commonly known as: ALDACTONE Take 1 tablet (50 mg total) by mouth daily. Start taking on: September 06, 2019       Follow-up Information    Care, [facility] Follow up.   Specialty: [place] Why: A representative from [facility] will contact you to arrange start date and time for your services.  Contact information: [place] [place] [id number] [phone]              No Known Allergies  Consultations:  Cardiology  Procedures/Studies: DG Chest 2 View  Result Date: 09/05/2019 CLINICAL DATA:  Cardiac arrhythmia with pacemaker placement EXAM: CHEST - 2 VIEW COMPARISON:  September 01, 2019 FINDINGS: Pacemaker present on the left with lead tips attached to right atrium and right ventricle. No pneumothorax. There is slight elevation of the left hemidiaphragm with left base atelectasis and apparent pleural effusion layering posteriorly. Lungs elsewhere are clear. Heart is borderline enlarged with pulmonary vascularity normal. No adenopathy. No bone lesions. IMPRESSION: Pacemaker leads attached to right atrium right ventricle. No pneumothorax. Left base atelectasis with apparent pleural effusion posteriorly on the left. Lungs otherwise clear. Heart borderline enlarged. Electronically Signed   By: Lowella Grip III M.D.   On: 09/05/2019 08:12   CT Head Wo Contrast  Result Date: 08/29/2019 CLINICAL DATA:  Post cardiac arrest. EXAM: CT HEAD WITHOUT CONTRAST CT CERVICAL SPINE WITHOUT CONTRAST TECHNIQUE: Multidetector CT imaging of the head and cervical spine was performed following the standard protocol without intravenous contrast. Multiplanar CT image reconstructions of the cervical spine were also generated.  COMPARISON:  None FINDINGS: CT HEAD FINDINGS Brain: No evidence of acute infarction, hemorrhage, hydrocephalus, extra-axial collection or mass lesion/mass effect. Subtle focus of added density in the LEFT basal ganglia likely asymmetric calcification is. Mild white matter disease. Vascular: No hyperdense vessel or unexpected calcification. Skull: Normal. Negative for fracture or focal lesion. Sinuses/Orbits: Mild mucosal thickening of ethmoid sinuses. Sinuses are incompletely imaged. Small amount of fluid in the posterior ethmoids likely related to indwelling endotracheal tube. Other: None. CT CERVICAL SPINE FINDINGS Alignment: Normal.  Head is turned to the RIGHT. Skull base and vertebrae: No acute fracture. No primary bone lesion or focal pathologic process. Soft tissues and spinal canal: Endotracheal tube in place. Gastric tube in place, tubes are incompletely imaged. Prevertebral soft tissues with limited assessment due to indwelling tubes. Disc levels: Multilevel degenerative changes greatest at C4-5 C5-6 and C6-7 with small anterior osteophytes. Upper chest: Scarring in the upper chest at the RIGHT lung apex. Extensive areas of parenchymal opacity were seen bilaterally in the chest on previous chest CT. Other: None IMPRESSION: 1. No acute intracranial abnormality. 2. No acute spine fracture. 3. Multilevel degenerative changes of the cervical spine. 4. Apical scarring in the chest in the RIGHT upper lobe in this patient with diffuse parenchymal disease on previous chest CT. 5. Aortic atherosclerosis. Aortic Atherosclerosis (ICD10-I70.0). Electronically Signed   By: Zetta Bills M.D.   On: 08/29/2019 20:59   CT Cervical Spine Wo Contrast  Result Date: 08/29/2019 CLINICAL DATA:  Post cardiac arrest. EXAM: CT HEAD WITHOUT CONTRAST CT CERVICAL SPINE WITHOUT CONTRAST TECHNIQUE: Multidetector CT imaging of the head and cervical spine was performed following the standard protocol without intravenous contrast.  Multiplanar CT image reconstructions of the cervical spine were also generated. COMPARISON:  None FINDINGS: CT HEAD FINDINGS Brain: No evidence of acute infarction, hemorrhage, hydrocephalus, extra-axial collection or mass lesion/mass effect. Subtle focus of added density in the LEFT basal ganglia likely asymmetric calcification is. Mild white matter disease. Vascular: No hyperdense vessel or unexpected calcification. Skull: Normal. Negative for fracture or focal lesion. Sinuses/Orbits: Mild mucosal thickening of ethmoid sinuses. Sinuses are incompletely imaged. Small amount of fluid in the posterior ethmoids likely related to indwelling endotracheal tube. Other: None. CT CERVICAL SPINE FINDINGS Alignment: Normal.  Head is turned to the RIGHT. Skull base and vertebrae: No acute fracture. No primary bone lesion or focal pathologic process. Soft tissues and spinal canal: Endotracheal tube in place. Gastric tube in place, tubes are incompletely imaged. Prevertebral soft tissues with limited assessment due  to indwelling tubes. Disc levels: Multilevel degenerative changes greatest at C4-5 C5-6 and C6-7 with small anterior osteophytes. Upper chest: Scarring in the upper chest at the RIGHT lung apex. Extensive areas of parenchymal opacity were seen bilaterally in the chest on previous chest CT. Other: None IMPRESSION: 1. No acute intracranial abnormality. 2. No acute spine fracture. 3. Multilevel degenerative changes of the cervical spine. 4. Apical scarring in the chest in the RIGHT upper lobe in this patient with diffuse parenchymal disease on previous chest CT. 5. Aortic atherosclerosis. Aortic Atherosclerosis (ICD10-I70.0). Electronically Signed   By: Zetta Bills M.D.   On: 08/29/2019 20:59   CARDIAC CATHETERIZATION  Result Date: 09/03/2019  2nd Mrg lesion is 99% stenosed.  Please see Dr. Clayborne Dana full cath report for details of the diagnostic catheterization. Unsuccessful attempt at PCI of the occluded OM  branch. I was unable to cross the lesion with a wire. No balloon angioplasty performed. Discussed case with Dr. Haroldine Laws. Will load with Plavix and continue ASA.   CARDIAC CATHETERIZATION  Result Date: 09/03/2019  Ost RCA to Prox RCA lesion is 100% stenosed.  Previously placed Prox LAD to Mid LAD stent (unknown type) is widely patent.  1st Diag lesion is 90% stenosed.  Ost LAD lesion is 30% stenosed.  Prox Cx to Mid Cx lesion is 50% stenosed.  Ost Cx to Prox Cx lesion is 40% stenosed.  2nd Mrg lesion is 99% stenosed.  Findings: Ao = 116/58 (79) LV = 118/10 RA = 10 RV = 43/8 PA = 45/19 (26) PCW = 14 Fick cardiac output/index = 5.5/2.9 PVR = 2.2 Ao sat = 99% PA sat = 65%, 65% Assessment: 1.Significant CAD with left dominant system 2. Separate ostia for LAD & LCx 3. LAD with widely patent proximal sten. Mild non-obstructive CAD 4. LCX large dominant vessel with moderate diffuse disease. Culprit lesion appears to be dissected OM-2 branch with 99% lesion 5. RCA small non-dominant that is totally occluded 6. LVEF recovered at 55% 7. Well-compensated hemodynamics. Plan/Discussion: Suspect culprit lesion is OM-2 will attempt PCI if amenable. Otherwise medical therapy. If unable to revascularize, consider LifeVest on d/c. Glori Bickers, MD 9:25 AM   EP PPM/ICD IMPLANT  Result Date: 09/04/2019 CONCLUSIONS:  1. VF cardiac arrest.  2. Successful ICD implantation.  3. DFT less than or equal to 21 joules.  4. No early apparent complications. ICD Criteria Current LVEF:55%. Within 12 months prior to implant: Yes Heart failure history: No Cardiomyopathy history: No. Atrial Fibrillation/Atrial Flutter: Yes, Paroxysmal. Ventricular tachycardia history: No. Cardiac arrest history: Yes, Ventricular Fibrillation. History of syndromes with risk of sudden death: No. Previous ICD: No. Current ICD indication: Secondary PPM indication: No.  Class I or II Bradycardia indication present: No Beta Blocker therapy for 3 or more  months: No, medical reason. Ace Inhibitor/ARB therapy for 3 or more months: No, medical reason. Cristopher Peru, MD 1:42 PM 09/04/2019   DG CHEST PORT 1 VIEW  Result Date: 09/01/2019 CLINICAL DATA:  Acute respiratory failure.  Hypoxia. EXAM: PORTABLE CHEST 1 VIEW COMPARISON:  08/29/2019.  CT 09/17/2019. FINDINGS: Interim extubation and removal of NG tube. Left IJ line stable position. Heart size stable. Stable bilateral interstitial prominence. Low lung volumes with bibasilar atelectasis. No prominent pleural effusion or pneumothorax. No acute bony abnormality identified. IMPRESSION: 1. Interim extubation removal of NG tube. Left IJ line stable position. 2. Stable bilateral interstitial prominence. Low lung volumes with bibasilar atelectasis. No prominent pleural effusion or pneumothorax. Electronically Signed   By:  [place]   On: 09/01/2019 06:26   DG Chest Portable 1 View  Result Date: 08/29/2019 CLINICAL DATA:  Central line placement. EXAM: PORTABLE CHEST 1 VIEW COMPARISON:  August 29, 2019 FINDINGS: A left internal jugular venous catheter is seen with its distal tip noted near the junction of the superior vena cava and right atrium. Stable endotracheal tube and nasogastric tube positioning is noted. Mild stable bilateral airspace disease is seen. The heart size and mediastinal contours are within normal limits. The visualized skeletal structures are unremarkable. IMPRESSION: Interval left internal jugular venous catheter placement positioning, as described above, when compared to the prior study. Electronically Signed   By: Virgina Norfolk M.D.   On: 08/29/2019 22:47   DG Chest Portable 1 View  Result Date: 08/29/2019 CLINICAL DATA:  Patient status post intubation and OG tube placement. EXAM: PORTABLE CHEST 1 VIEW COMPARISON:  Single-view of the chest 04/28/2019 and PA and lateral chest 09/09/2010. CT chest 04/20/2019. FINDINGS: Endotracheal tube is in place with the tip 3.3 cm above the carina. OG  tube tip is seen in the fundus of the stomach. There is mild cardiomegaly. Atherosclerosis is seen. Patchy airspace disease is seen in the right upper lung zone and throughout the left chest. No pneumothorax or pleural fluid. No acute or focal bony abnormality. IMPRESSION: OG tube and ET tube in good position. Left worse than right patchy airspace disease most worrisome for pneumonia. Electronically Signed   By: Inge Rise M.D.   On: 08/29/2019 18:30   ECHOCARDIOGRAM COMPLETE  Result Date: 08/30/2019    ECHOCARDIOGRAM REPORT   Patient Name:   Peter Lambert Date of Exam: 08/30/2019 Medical Rec #:  [id number]       Height:       68.0 in Accession #:    [id number]      Weight:       167.3 lb Date of Birth:  09-Oct-1945      BSA:          1.895 m Patient Age:    27 years        BP:           81/76 mmHg Patient Gender: M               HR:           64 bpm. Exam Location:  Inpatient Procedure: 2D Echo, Cardiac Doppler, Color Doppler and Intracardiac            Opacification Agent Indications:    Cardiac arrest  History:        Patient has prior history of Echocardiogram examinations, most                 recent 04/20/2019. CHF, CAD, COPD, Signs/Symptoms:Dyspnea; Risk                 Factors:Hypertension, Current Smoker and Dyslipidemia.  Sonographer:    Roseanna Rainbow RDCS Referring Phys: [id number] Francesca Jewett  Sonographer Comments: Technically difficult study due to poor echo windows and echo performed with patient supine and on artificial respirator. Image acquisition challenging due to respiratory motion. IMPRESSIONS  1. Left ventricular ejection fraction, by estimation, is 20%. The left ventricle has severely decreased function. The left ventricle demonstrates global hypokinesis, more prominent inferior/posterior walls. There is moderate left ventricular hypertrophy. Left ventricular diastolic parameters are consistent with Grade II diastolic dysfunction (pseudonormalization). Slow flow and stasis noted by  Definity contrast in LV particularly at  apex. Although no formed thrombus, there is significant substrate for thrombus formation.  2. RV-RA gradient 25 mmHg. Right ventricular systolic function is severely reduced. The right ventricular size is normal.  3. The mitral valve is grossly normal, mildly thickened. Mild mitral valve regurgitation.  4. The aortic valve is tricuspid. Aortic valve regurgitation is not visualized. Mild aortic valve sclerosis is present, with no evidence of aortic valve stenosis.  5. Unable to estimate CVP. FINDINGS  Left Ventricle: Left ventricular ejection fraction, by estimation, is 20%. The left ventricle has severely decreased function. The left ventricle demonstrates global hypokinesis. Definity contrast agent was given IV to delineate the left ventricular endocardial borders. The left ventricular internal cavity size was normal in size. There is moderate left ventricular hypertrophy. Left ventricular diastolic parameters are consistent with Grade II diastolic dysfunction (pseudonormalization). Right Ventricle: RV-RA gradient 25 mmHg. The right ventricular size is normal. No increase in right ventricular wall thickness. Right ventricular systolic function is severely reduced. Left Atrium: Left atrial size was normal in size. Right Atrium: Right atrial size was normal in size. Pericardium: There is no evidence of pericardial effusion. Mitral Valve: The mitral valve is grossly normal. There is mild thickening of the mitral valve leaflet(s). Mild mitral valve regurgitation. Tricuspid Valve: The tricuspid valve is grossly normal. Tricuspid valve regurgitation is trivial. Aortic Valve: The aortic valve is tricuspid. Aortic valve regurgitation is not visualized. Mild aortic valve sclerosis is present, with no evidence of aortic valve stenosis. Mild aortic valve annular calcification. Pulmonic Valve: The pulmonic valve was grossly normal. Pulmonic valve regurgitation is trivial. Aorta: The  aortic root is normal in size and structure. Venous: Unable to estimate CVP. IVC assessment for right atrial pressure unable to be performed due to mechanical ventilation. IAS/Shunts: No atrial level shunt detected by color flow Doppler.  LEFT VENTRICLE PLAX 2D LVIDd:         4.80 cm     Diastology LVIDs:         4.40 cm     LV e' lateral:   2.64 cm/s LV PW:         1.50 cm     LV E/e' lateral: 14.7 LV IVS:        1.40 cm     LV e' medial:    2.87 cm/s LVOT diam:     2.20 cm     LV E/e' medial:  13.6 LV SV:         23 LV SV Index:   12 LVOT Area:     3.80 cm  LV Volumes (MOD) LV vol d, MOD A2C: 93.5 ml LV vol d, MOD A4C: 95.8 ml LV vol s, MOD A2C: 68.4 ml LV vol s, MOD A4C: 71.8 ml LV SV MOD A2C:     25.1 ml LV SV MOD A4C:     95.8 ml LV SV MOD BP:      23.8 ml RIGHT VENTRICLE            IVC RV S prime:     4.73 cm/s  IVC diam: 1.90 cm TAPSE (M-mode): 1.0 cm LEFT ATRIUM           Index       RIGHT ATRIUM           Index LA diam:      3.90 cm 2.06 cm/m  RA Area:     16.70 cm LA Vol (A2C): 27.0 ml 14.25 ml/m RA Volume:   44.20  ml  23.33 ml/m LA Vol (A4C): 25.6 ml 13.51 ml/m  AORTIC VALVE LVOT Vmax:   40.70 cm/s LVOT Vmean:  25.800 cm/s LVOT VTI:    0.060 m  AORTA Ao Root diam: 3.60 cm MITRAL VALVE               TRICUSPID VALVE MV Area (PHT): 2.62 cm    TR Peak grad:   25.2 mmHg MV Decel Time: 289 msec    TR Vmax:        251.00 cm/s MV E velocity: 38.90 cm/s MV A velocity: 42.10 cm/s  SHUNTS MV E/A ratio:  0.92        Systemic VTI:  0.06 m                            Systemic Diam: 2.20 cm Rozann Lesches MD Electronically signed by Rozann Lesches MD Signature Date/Time: 08/30/2019/4:40:40 PM    Final      Subjective: "I've got that already."  Discharge Exam: Vitals:   09/05/19 1231 09/05/19 1403  BP:  118/63  Pulse: 73 73  Resp:  15  Temp:  98.6 F (37 C)  SpO2: 98% 94%   Vitals:   09/05/19 0400 09/05/19 0845 09/05/19 1231 09/05/19 1403  BP:  (!) 140/59  118/63  Pulse:   73 73  Resp:    15   Temp:    98.6 F (37 C)  TempSrc:    Oral  SpO2:   98% 94%  Weight: 75.3 kg     Height:        General: 74 y.o. male resting in chair in NAD Cardiovascular: RRR, +S1, S2, no m/g/r Respiratory: CTABL, no w/r/r, normal WOB GI: BS+, NDNT, soft MSK: No e/c/c Neuro: A&O x 3, no focal deficits Psyc: Appropriate interaction and affect, calm/cooperative  The results of significant diagnostics from this hospitalization (including imaging, microbiology, ancillary and laboratory) are listed below for reference.     Microbiology: Recent Results (from the past 240 hour(s))  SARS Coronavirus 2 by RT PCR (hospital order, performed in [facility] hospital lab) Nasopharyngeal Nasopharyngeal Swab     Status: None   Collection Time: 08/29/19  6:30 PM   Specimen: Nasopharyngeal Swab  Result Value Ref Range Status   SARS Coronavirus 2 NEGATIVE NEGATIVE Final    Comment: (NOTE) SARS-CoV-2 target nucleic acids are NOT DETECTED.  The SARS-CoV-2 RNA is generally detectable in upper and lower respiratory specimens during the acute phase of infection. The lowest concentration of SARS-CoV-2 viral copies this assay can detect is 250 copies / mL. A negative result does not preclude SARS-CoV-2 infection and should not be used as the sole basis for treatment or other patient management decisions.  A negative result may occur with improper specimen collection / handling, submission of specimen other than nasopharyngeal swab, presence of viral mutation(s) within the areas targeted by this assay, and inadequate number of viral copies (<250 copies / mL). A negative result must be combined with clinical observations, patient history, and epidemiological information.  Fact Sheet for Patients:   StrictlyIdeas.no  Fact Sheet for Healthcare Providers: BankingDealers.co.za  This test is not yet approved or  cleared by the [place] FDA and has been authorized  for detection and/or diagnosis of SARS-CoV-2 by FDA under an Emergency Use Authorization (EUA).  This EUA will remain in effect (meaning this test can be used) for the duration of the COVID-19 declaration under  Section 564(b)(1) of the Act, 21 U.S.C. section 360bbb-3(b)(1), unless the authorization is terminated or revoked sooner.  Performed at [facility], [place] [address] [place], [place] [address]   Blood culture (routine x 2)     Status: None   Collection Time: 08/29/19  6:35 PM   Specimen: BLOOD  Result Value Ref Range Status   Specimen Description BLOOD RIGHT ANTECUBITAL  Final   Special Requests   Final    BOTTLES DRAWN AEROBIC ONLY Blood Culture results may not be optimal due to an inadequate volume of blood received in culture bottles   Culture   Final    NO GROWTH 5 DAYS Performed at [facility], [place] [address], [place], [place] [address]    Report Status 09/03/2019 FINAL  Final  Blood culture (routine x 2)     Status: None   Collection Time: 08/29/19  6:35 PM   Specimen: BLOOD LEFT HAND  Result Value Ref Range Status   Specimen Description BLOOD LEFT HAND  Final   Special Requests   Final    BOTTLES DRAWN AEROBIC AND ANAEROBIC Blood Culture adequate volume   Culture   Final    NO GROWTH 5 DAYS Performed at [facility], [place] [address], [place], [place] [address]    Report Status 09/03/2019 FINAL  Final  MRSA PCR Screening     Status: None   Collection Time: 08/30/19 12:38 AM   Specimen: Nasopharyngeal  Result Value Ref Range Status   MRSA by PCR NEGATIVE NEGATIVE Final    Comment:        The GeneXpert MRSA Assay (FDA approved for NASAL specimens only), is one component of a comprehensive MRSA colonization surveillance program. It is not intended to diagnose MRSA infection nor to guide or monitor treatment for MRSA infections. Performed at [facility], [place] [address], [place], [place] [address]   Culture, respiratory  (non-expectorated)     Status: None   Collection Time: 08/30/19 12:12 PM   Specimen: Tracheal Aspirate; Respiratory  Result Value Ref Range Status   Specimen Description TRACHEAL ASPIRATE  Final   Special Requests NONE  Final   Gram Stain   Final    RARE WBC PRESENT, PREDOMINANTLY PMN RARE GRAM POSITIVE COCCI IN PAIRS RARE GRAM NEGATIVE RODS    Culture   Final    RARE Consistent with normal respiratory flora. Performed at [facility], [place] [address] [place], [place] [address]    Report Status 09/01/2019 FINAL  Final  Respiratory Panel by PCR     Status: None   Collection Time: 08/31/19  8:59 AM   Specimen: Nasopharyngeal Swab; Respiratory  Result Value Ref Range Status   Adenovirus NOT DETECTED NOT DETECTED Final   Coronavirus 229E NOT DETECTED NOT DETECTED Final    Comment: (NOTE) The Coronavirus on the Respiratory Panel, DOES NOT test for the novel  Coronavirus (2019 nCoV)    Coronavirus HKU1 NOT DETECTED NOT DETECTED Final   Coronavirus NL63 NOT DETECTED NOT DETECTED Final   Coronavirus OC43 NOT DETECTED NOT DETECTED Final   Metapneumovirus NOT DETECTED NOT DETECTED Final   Rhinovirus / Enterovirus NOT DETECTED NOT DETECTED Final   Influenza A NOT DETECTED NOT DETECTED Final   Influenza B NOT DETECTED NOT DETECTED Final   Parainfluenza Virus 1 NOT DETECTED NOT DETECTED Final   Parainfluenza Virus 2 NOT DETECTED NOT DETECTED Final   Parainfluenza Virus 3 NOT DETECTED NOT DETECTED  Final   Parainfluenza Virus 4 NOT DETECTED NOT DETECTED Final   Respiratory Syncytial Virus NOT DETECTED NOT DETECTED Final   Bordetella pertussis NOT DETECTED NOT DETECTED Final   Chlamydophila pneumoniae NOT DETECTED NOT DETECTED Final   Mycoplasma pneumoniae NOT DETECTED NOT DETECTED Final    Comment: Performed at [facility], [place] [address], [place], [place] [address]  Surgical PCR screen     Status: None   Collection Time: 09/03/19  9:26 PM   Specimen: Nasal Mucosa; Nasal  Swab  Result Value Ref Range Status   MRSA, PCR NEGATIVE NEGATIVE Final   Staphylococcus aureus NEGATIVE NEGATIVE Final    Comment: (NOTE) The Xpert SA Assay (FDA approved for NASAL specimens in patients 31 years of age and older), is one component of a comprehensive surveillance program. It is not intended to diagnose infection nor to guide or monitor treatment. Performed at [facility], [place] [address] [place], [place] [address]      Labs: BNP (last 3 results) Recent Labs    04/20/19 0846 04/23/19 2037 08/29/19 1819  BNP 580.0* 793.0* 093.2*   Basic Metabolic Panel: Recent Labs  Lab 08/31/19 0939 08/31/19 0939 08/31/19 1630 09/01/19 0028 09/01/19 0500 09/01/19 0500 09/02/19 0325 09/02/19 0325 09/02/19 1501 09/02/19 1501 09/03/19 0458 09/03/19 0843 09/03/19 0848 09/04/19 0409 09/05/19 0339  NA  --   --   --    < > 139   < > 138   < > 139   < > 140 144 141  137 139 139  K  --   --   --    < > 3.3*   < > 3.0*   < > 4.2   < > 3.9 3.7 3.9  3.5 3.8 3.9  CL  --   --   --    < > 102   < > 101  --  102  --  102  --   --  103 104  CO2  --   --   --    < > 25   < > 27  --  27  --  29  --   --  27 27  GLUCOSE  --   --   --    < > 122*   < > 118*  --  145*  --  108*  --   --  102* 98  BUN  --   --   --    < > 48*   < > 35*  --  27*  --  23  --   --  16 16  CREATININE  --   --   --    < > 2.12*   < > 1.71*  --  1.47*  --  1.45*  --   --  1.31* 1.36*  CALCIUM  --   --   --    < > 8.4*   < > 8.3*  --  8.5*  --  9.1  --   --  9.2 8.8*  MG 2.2   < > 2.1   < > 1.8  --  2.1  --   --   --  2.1  --   --  1.9 1.9  PHOS 3.7  --  4.0  --  3.3  --   --   --   --   --   --   --   --   --   --    < > =  values in this interval not displayed.   Liver Function Tests: Recent Labs  Lab 08/29/19 1819 08/29/19 2231 08/30/19 1006 09/04/19 0409  AST 86* 91* 66* 24  ALT 64* 59* 50* 32  ALKPHOS 82 71 54 53  BILITOT 0.7 1.6* 0.8 0.9  PROT 6.7 6.4* 5.9* 6.6  ALBUMIN 3.4* 3.3* 2.9*  3.1*   No results for input(s): LIPASE, AMYLASE in the last 168 hours. No results for input(s): AMMONIA in the last 168 hours. CBC: Recent Labs  Lab 08/29/19 1819 08/29/19 1907 09/01/19 0500 09/01/19 0500 09/02/19 0325 09/02/19 0325 09/03/19 0458 09/03/19 0843 09/03/19 0848 09/04/19 0409 09/05/19 0339  WBC 24.3*   < > 9.0  --  9.3  --  7.6  --   --  7.4 7.9  NEUTROABS 16.8*  --   --   --   --   --   --   --   --  4.7  --   HGB 12.0*   < > 9.6*   < > 9.4*   < > 9.9* 10.2* 10.2*  10.2* 10.3* 9.1*  HCT 39.0   < > 30.4*   < > 29.2*   < > 32.0* 30.0* 30.0*  30.0* 32.1* 29.7*  MCV 90.9   < > 87.9  --  88.0  --  89.1  --   --  89.4 89.2  PLT 218   < > 126*  --  135*  --  167  --   --  172 166   < > = values in this interval not displayed.   Cardiac Enzymes: No results for input(s): CKTOTAL, CKMB, CKMBINDEX, TROPONINI in the last 168 hours. BNP: Invalid input(s): POCBNP CBG: Recent Labs  Lab 09/03/19 2056 09/04/19 0945 09/04/19 1632 09/05/19 0728 09/05/19 1127  GLUCAP 119* 127* 96 129* 184*   D-Dimer No results for input(s): DDIMER in the last 72 hours. Hgb A1c No results for input(s): HGBA1C in the last 72 hours. Lipid Profile No results for input(s): CHOL, HDL, LDLCALC, TRIG, CHOLHDL, LDLDIRECT in the last 72 hours. Thyroid function studies No results for input(s): TSH, T4TOTAL, T3FREE, THYROIDAB in the last 72 hours.  Invalid input(s): FREET3 Anemia work up No results for input(s): VITAMINB12, FOLATE, FERRITIN, TIBC, IRON, RETICCTPCT in the last 72 hours. Urinalysis    Component Value Date/Time   COLORURINE YELLOW 08/29/2019 1827   APPEARANCEUR HAZY (A) 08/29/2019 1827   LABSPEC 1.009 08/29/2019 1827   PHURINE 7.0 08/29/2019 1827   GLUCOSEU 150 (A) 08/29/2019 1827   HGBUR MODERATE (A) 08/29/2019 1827   BILIRUBINUR NEGATIVE 08/29/2019 1827   KETONESUR NEGATIVE 08/29/2019 1827   PROTEINUR 100 (A) 08/29/2019 1827   NITRITE NEGATIVE 08/29/2019 1827    LEUKOCYTESUR NEGATIVE 08/29/2019 1827   Sepsis Labs Invalid input(s): PROCALCITONIN,  WBC,  LACTICIDVEN Microbiology Recent Results (from the past 240 hour(s))  SARS Coronavirus 2 by RT PCR (hospital order, performed in [place] hospital lab) Nasopharyngeal Nasopharyngeal Swab     Status: None   Collection Time: 08/29/19  6:30 PM   Specimen: Nasopharyngeal Swab  Result Value Ref Range Status   SARS Coronavirus 2 NEGATIVE NEGATIVE Final    Comment: (NOTE) SARS-CoV-2 target nucleic acids are NOT DETECTED.  The SARS-CoV-2 RNA is generally detectable in upper and lower respiratory specimens during the acute phase of infection. The lowest concentration of SARS-CoV-2 viral copies this assay can detect is 250 copies / mL. A negative result does not preclude SARS-CoV-2 infection and should  not be used as the sole basis for treatment or other patient management decisions.  A negative result may occur with improper specimen collection / handling, submission of specimen other than nasopharyngeal swab, presence of viral mutation(s) within the areas targeted by this assay, and inadequate number of viral copies (<250 copies / mL). A negative result must be combined with clinical observations, patient history, and epidemiological information.  Fact Sheet for Patients:   StrictlyIdeas.no  Fact Sheet for Healthcare Providers: BankingDealers.co.za  This test is not yet approved or  cleared by the [place] FDA and has been authorized for detection and/or diagnosis of SARS-CoV-2 by FDA under an Emergency Use Authorization (EUA).  This EUA will remain in effect (meaning this test can be used) for the duration of the COVID-19 declaration under Section 564(b)(1) of the Act, 21 U.S.C. section 360bbb-3(b)(1), unless the authorization is terminated or revoked sooner.  Performed at [facility], [place] [address] [place], [place] [address]    Blood culture (routine x 2)     Status: None   Collection Time: 08/29/19  6:35 PM   Specimen: BLOOD  Result Value Ref Range Status   Specimen Description BLOOD RIGHT ANTECUBITAL  Final   Special Requests   Final    BOTTLES DRAWN AEROBIC ONLY Blood Culture results may not be optimal due to an inadequate volume of blood received in culture bottles   Culture   Final    NO GROWTH 5 DAYS Performed at [facility], [place] [address] [place], [place] [address]    Report Status 09/03/2019 FINAL  Final  Blood culture (routine x 2)     Status: None   Collection Time: 08/29/19  6:35 PM   Specimen: BLOOD LEFT HAND  Result Value Ref Range Status   Specimen Description BLOOD LEFT HAND  Final   Special Requests   Final    BOTTLES DRAWN AEROBIC AND ANAEROBIC Blood Culture adequate volume   Culture   Final    NO GROWTH 5 DAYS Performed at [facility], [place] [address], [place], [place] [address]    Report Status 09/03/2019 FINAL  Final  MRSA PCR Screening     Status: None   Collection Time: 08/30/19 12:38 AM   Specimen: Nasopharyngeal  Result Value Ref Range Status   MRSA by PCR NEGATIVE NEGATIVE Final    Comment:        The GeneXpert MRSA Assay (FDA approved for NASAL specimens only), is one component of a comprehensive MRSA colonization surveillance program. It is not intended to diagnose MRSA infection nor to guide or monitor treatment for MRSA infections. Performed at [facility], [place] [address], [place], [place] [address]   Culture, respiratory (non-expectorated)     Status: None   Collection Time: 08/30/19 12:12 PM   Specimen: Tracheal Aspirate; Respiratory  Result Value Ref Range Status   Specimen Description TRACHEAL ASPIRATE  Final   Special Requests NONE  Final   Gram Stain   Final    RARE WBC PRESENT, PREDOMINANTLY PMN RARE GRAM POSITIVE COCCI IN PAIRS RARE GRAM NEGATIVE RODS    Culture   Final    RARE Consistent with normal respiratory  flora. Performed at [facility], [place] [address], [place], [place] [address]    Report Status 09/01/2019 FINAL  Final  Respiratory Panel by PCR     Status: None   Collection Time: 08/31/19  8:59 AM   Specimen: Nasopharyngeal Swab;  Respiratory  Result Value Ref Range Status   Adenovirus NOT DETECTED NOT DETECTED Final   Coronavirus 229E NOT DETECTED NOT DETECTED Final    Comment: (NOTE) The Coronavirus on the Respiratory Panel, DOES NOT test for the novel  Coronavirus (2019 nCoV)    Coronavirus HKU1 NOT DETECTED NOT DETECTED Final   Coronavirus NL63 NOT DETECTED NOT DETECTED Final   Coronavirus OC43 NOT DETECTED NOT DETECTED Final   Metapneumovirus NOT DETECTED NOT DETECTED Final   Rhinovirus / Enterovirus NOT DETECTED NOT DETECTED Final   Influenza A NOT DETECTED NOT DETECTED Final   Influenza B NOT DETECTED NOT DETECTED Final   Parainfluenza Virus 1 NOT DETECTED NOT DETECTED Final   Parainfluenza Virus 2 NOT DETECTED NOT DETECTED Final   Parainfluenza Virus 3 NOT DETECTED NOT DETECTED Final   Parainfluenza Virus 4 NOT DETECTED NOT DETECTED Final   Respiratory Syncytial Virus NOT DETECTED NOT DETECTED Final   Bordetella pertussis NOT DETECTED NOT DETECTED Final   Chlamydophila pneumoniae NOT DETECTED NOT DETECTED Final   Mycoplasma pneumoniae NOT DETECTED NOT DETECTED Final    Comment: Performed at [facility], [place] [address], [place], [place] [address]  Surgical PCR screen     Status: None   Collection Time: 09/03/19  9:26 PM   Specimen: Nasal Mucosa; Nasal Swab  Result Value Ref Range Status   MRSA, PCR NEGATIVE NEGATIVE Final   Staphylococcus aureus NEGATIVE NEGATIVE Final    Comment: (NOTE) The Xpert SA Assay (FDA approved for NASAL specimens in patients 74 years of age and older), is one component of a comprehensive surveillance program. It is not intended to diagnose infection nor to guide or monitor treatment. Performed at [facility], [place]  [address] [place], [place] [address]      Time coordinating discharge: 35 minutes  SIGNED:   Jonnie Finner, DO  [facility] 09/05/2019, 2:39 PM   If 7PM-7AM, please contact night-coverage [email or web address]

## 2019-09-07 ENCOUNTER — Encounter (HOSPITAL_COMMUNITY): Payer: Self-pay | Admitting: Internal Medicine

## 2019-09-07 MED FILL — Lidocaine HCl Local Inj 1%: INTRAMUSCULAR | Qty: 60 | Status: AC

## 2019-09-09 DIAGNOSIS — I10 Essential (primary) hypertension: Secondary | ICD-10-CM | POA: Diagnosis not present

## 2019-09-09 DIAGNOSIS — A419 Sepsis, unspecified organism: Secondary | ICD-10-CM | POA: Diagnosis not present

## 2019-09-11 ENCOUNTER — Other Ambulatory Visit (HOSPITAL_COMMUNITY): Payer: Self-pay | Admitting: Internal Medicine

## 2019-09-11 DIAGNOSIS — I219 Acute myocardial infarction, unspecified: Secondary | ICD-10-CM | POA: Diagnosis not present

## 2019-09-11 DIAGNOSIS — R059 Cough, unspecified: Secondary | ICD-10-CM

## 2019-09-11 DIAGNOSIS — I4891 Unspecified atrial fibrillation: Secondary | ICD-10-CM | POA: Diagnosis not present

## 2019-09-11 DIAGNOSIS — E875 Hyperkalemia: Secondary | ICD-10-CM | POA: Diagnosis not present

## 2019-09-11 DIAGNOSIS — J449 Chronic obstructive pulmonary disease, unspecified: Secondary | ICD-10-CM | POA: Diagnosis not present

## 2019-09-11 DIAGNOSIS — J9600 Acute respiratory failure, unspecified whether with hypoxia or hypercapnia: Secondary | ICD-10-CM | POA: Diagnosis not present

## 2019-09-11 DIAGNOSIS — I469 Cardiac arrest, cause unspecified: Secondary | ICD-10-CM | POA: Diagnosis not present

## 2019-09-11 DIAGNOSIS — Z6825 Body mass index (BMI) 25.0-25.9, adult: Secondary | ICD-10-CM | POA: Diagnosis not present

## 2019-09-15 DIAGNOSIS — J690 Pneumonitis due to inhalation of food and vomit: Secondary | ICD-10-CM | POA: Diagnosis not present

## 2019-09-15 DIAGNOSIS — I70209 Unspecified atherosclerosis of native arteries of extremities, unspecified extremity: Secondary | ICD-10-CM | POA: Diagnosis not present

## 2019-09-15 DIAGNOSIS — E1122 Type 2 diabetes mellitus with diabetic chronic kidney disease: Secondary | ICD-10-CM | POA: Diagnosis not present

## 2019-09-15 DIAGNOSIS — I4892 Unspecified atrial flutter: Secondary | ICD-10-CM | POA: Diagnosis not present

## 2019-09-15 DIAGNOSIS — E872 Acidosis: Secondary | ICD-10-CM | POA: Diagnosis not present

## 2019-09-15 DIAGNOSIS — I0981 Rheumatic heart failure: Secondary | ICD-10-CM | POA: Diagnosis not present

## 2019-09-15 DIAGNOSIS — I4891 Unspecified atrial fibrillation: Secondary | ICD-10-CM | POA: Diagnosis not present

## 2019-09-15 DIAGNOSIS — Z48812 Encounter for surgical aftercare following surgery on the circulatory system: Secondary | ICD-10-CM | POA: Diagnosis not present

## 2019-09-15 DIAGNOSIS — I493 Ventricular premature depolarization: Secondary | ICD-10-CM | POA: Diagnosis not present

## 2019-09-15 DIAGNOSIS — E785 Hyperlipidemia, unspecified: Secondary | ICD-10-CM | POA: Diagnosis not present

## 2019-09-15 DIAGNOSIS — E1151 Type 2 diabetes mellitus with diabetic peripheral angiopathy without gangrene: Secondary | ICD-10-CM | POA: Diagnosis not present

## 2019-09-15 DIAGNOSIS — D631 Anemia in chronic kidney disease: Secondary | ICD-10-CM | POA: Diagnosis not present

## 2019-09-15 DIAGNOSIS — I130 Hypertensive heart and chronic kidney disease with heart failure and stage 1 through stage 4 chronic kidney disease, or unspecified chronic kidney disease: Secondary | ICD-10-CM | POA: Diagnosis not present

## 2019-09-15 DIAGNOSIS — E876 Hypokalemia: Secondary | ICD-10-CM | POA: Diagnosis not present

## 2019-09-15 DIAGNOSIS — I4901 Ventricular fibrillation: Secondary | ICD-10-CM | POA: Diagnosis not present

## 2019-09-15 DIAGNOSIS — D696 Thrombocytopenia, unspecified: Secondary | ICD-10-CM | POA: Diagnosis not present

## 2019-09-15 DIAGNOSIS — I088 Other rheumatic multiple valve diseases: Secondary | ICD-10-CM | POA: Diagnosis not present

## 2019-09-15 DIAGNOSIS — I2510 Atherosclerotic heart disease of native coronary artery without angina pectoris: Secondary | ICD-10-CM | POA: Diagnosis not present

## 2019-09-15 DIAGNOSIS — I252 Old myocardial infarction: Secondary | ICD-10-CM | POA: Diagnosis not present

## 2019-09-15 DIAGNOSIS — D72829 Elevated white blood cell count, unspecified: Secondary | ICD-10-CM | POA: Diagnosis not present

## 2019-09-15 DIAGNOSIS — J449 Chronic obstructive pulmonary disease, unspecified: Secondary | ICD-10-CM | POA: Diagnosis not present

## 2019-09-15 DIAGNOSIS — I5021 Acute systolic (congestive) heart failure: Secondary | ICD-10-CM | POA: Diagnosis not present

## 2019-09-15 DIAGNOSIS — N179 Acute kidney failure, unspecified: Secondary | ICD-10-CM | POA: Diagnosis not present

## 2019-09-15 DIAGNOSIS — J9601 Acute respiratory failure with hypoxia: Secondary | ICD-10-CM | POA: Diagnosis not present

## 2019-09-15 DIAGNOSIS — N1832 Chronic kidney disease, stage 3b: Secondary | ICD-10-CM | POA: Diagnosis not present

## 2019-09-16 ENCOUNTER — Ambulatory Visit (HOSPITAL_COMMUNITY)
Admission: RE | Admit: 2019-09-16 | Discharge: 2019-09-16 | Disposition: A | Discharge status: Home / Self Care | Payer: PPO | Source: Ambulatory Visit | Attending: Internal Medicine | Admitting: Internal Medicine

## 2019-09-16 ENCOUNTER — Other Ambulatory Visit: Payer: Self-pay

## 2019-09-16 DIAGNOSIS — R05 Cough: Secondary | ICD-10-CM | POA: Insufficient documentation

## 2019-09-16 DIAGNOSIS — R059 Cough, unspecified: Secondary | ICD-10-CM

## 2019-09-16 IMAGING — DX DG CHEST 2V
2 series · 2 of 2 positions shown · non-contrast
Comparison: None.

CLINICAL DATA: Cough.  [DATE].

EXAM:
CHEST - 2 VIEW

[chest pa]
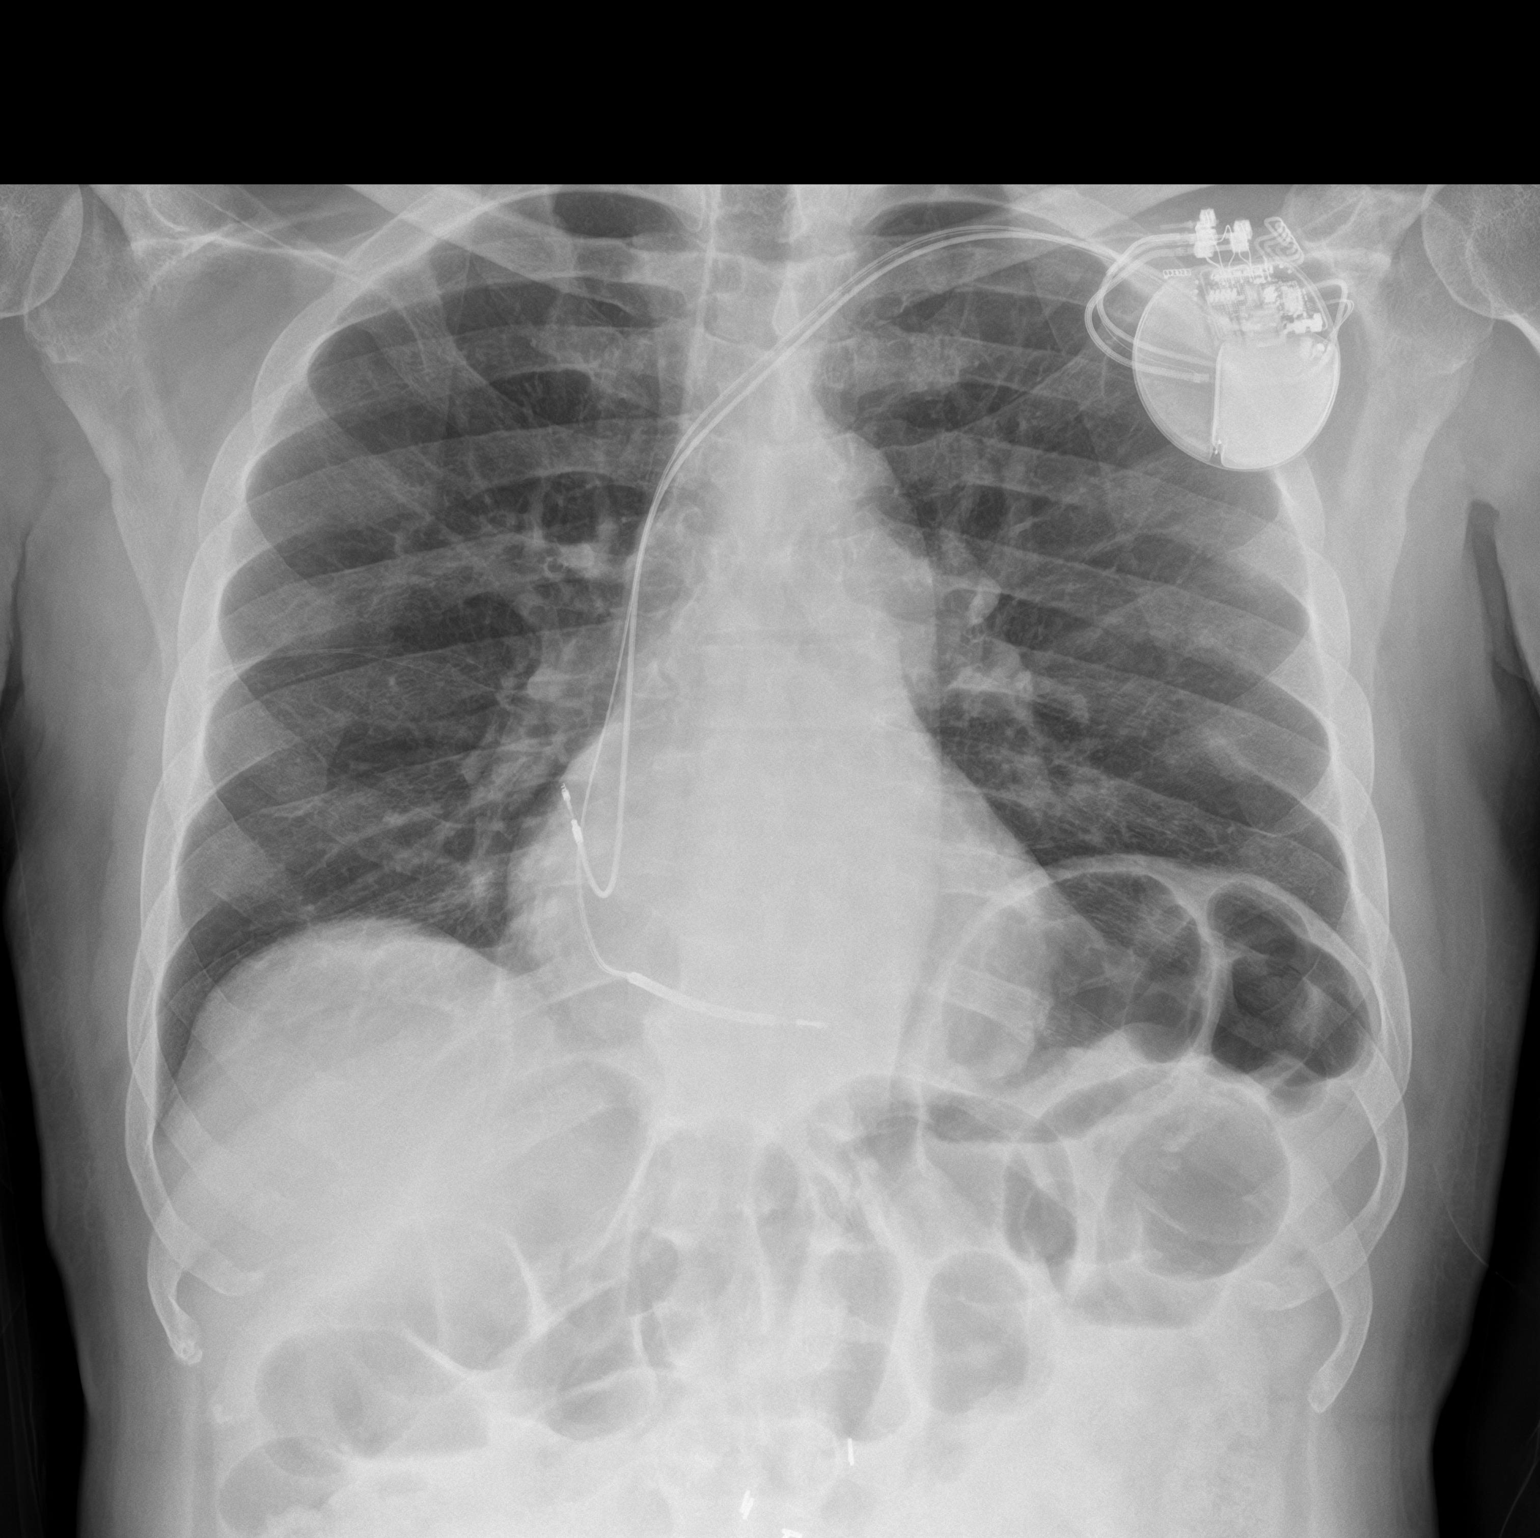

[chest lat]
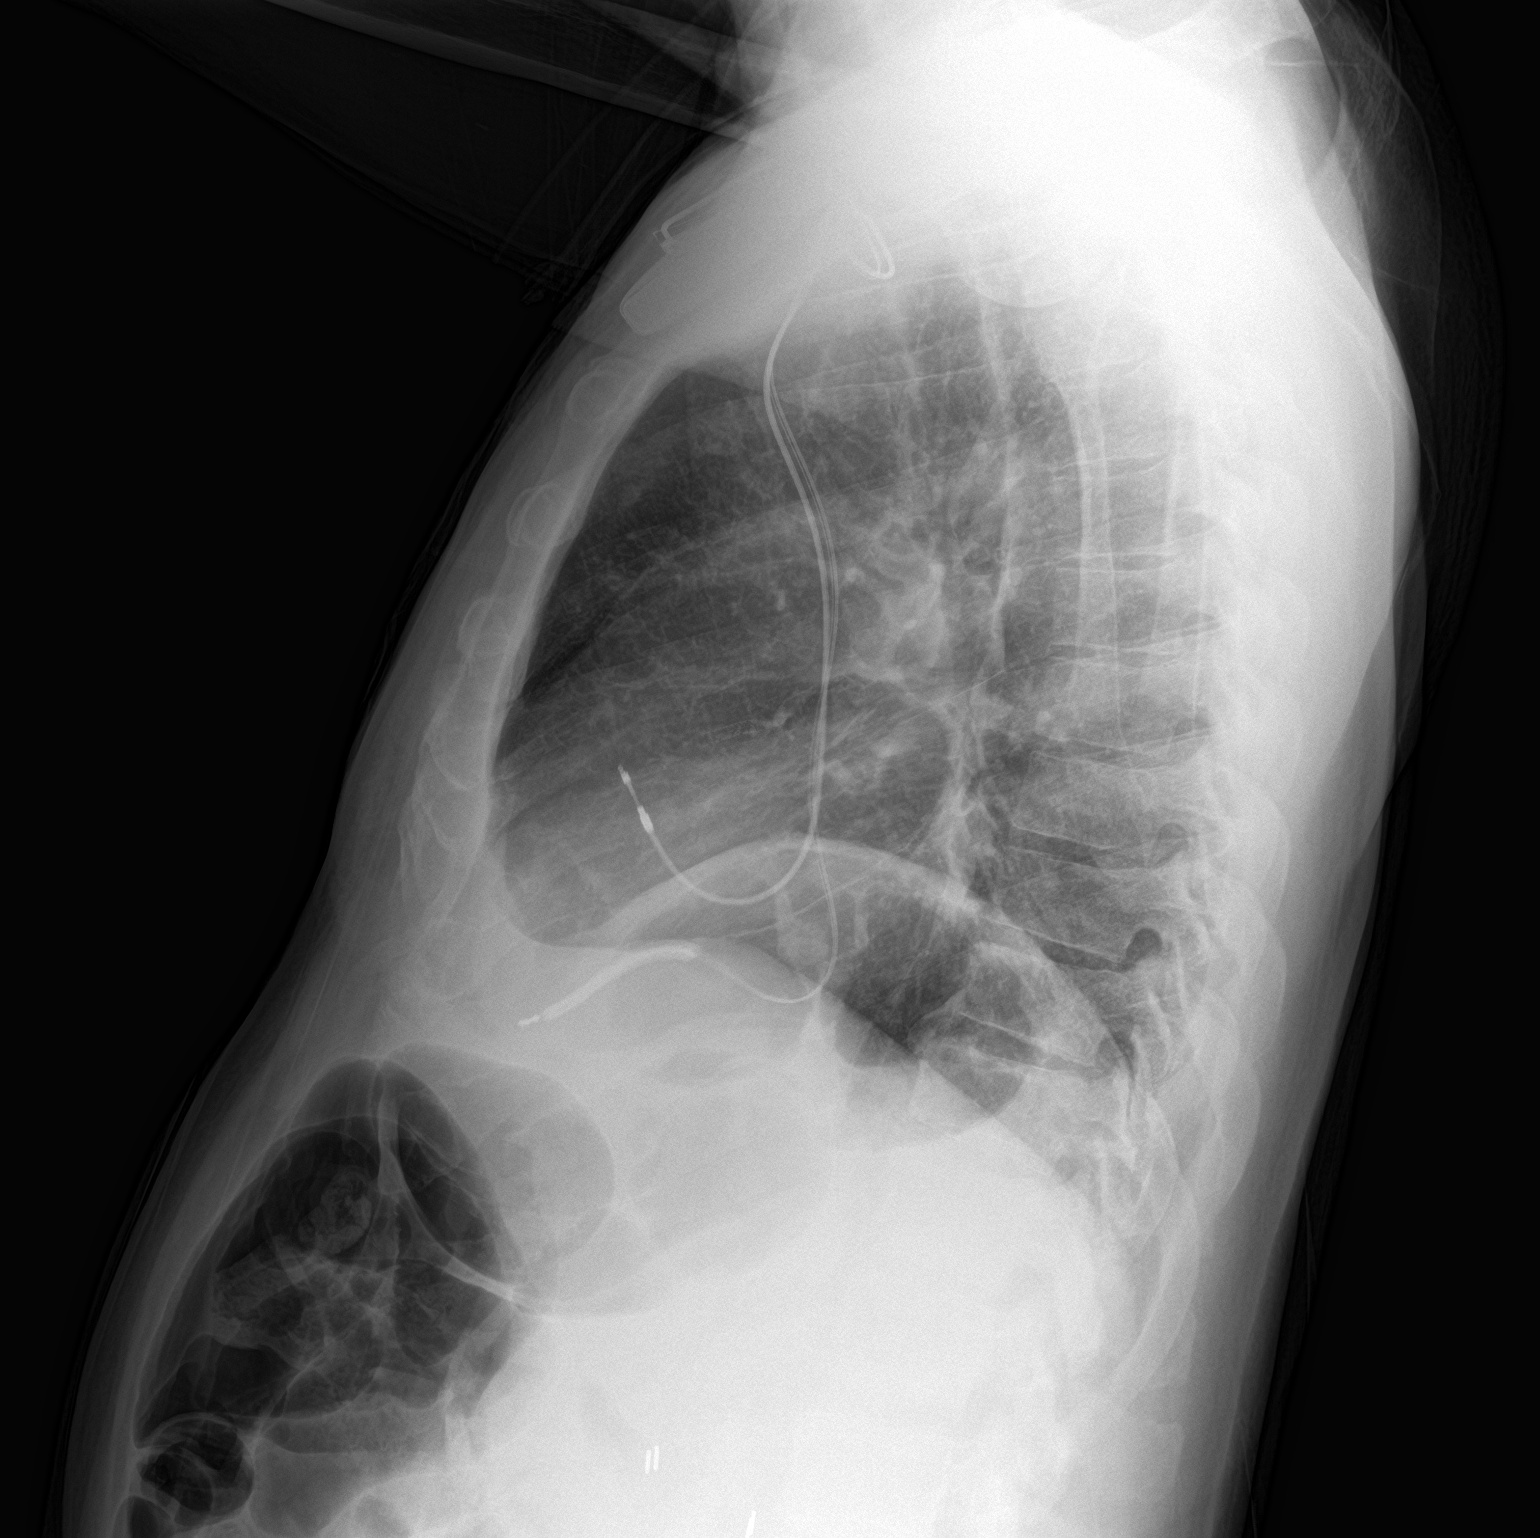

[2 of 2 positions shown; findings below may reference images not displayed]

FINDINGS: The heart size and mediastinal contours are within normal limits.
Both lungs are clear. Left-sided pacemaker is unchanged in position.
Stable elevated left hemidiaphragm is noted. The visualized skeletal
structures are unremarkable.
IMPRESSION: No active cardiopulmonary disease.

## 2019-09-17 ENCOUNTER — Telehealth: Payer: Self-pay | Admitting: Cardiovascular Disease

## 2019-09-17 ENCOUNTER — Ambulatory Visit (INDEPENDENT_AMBULATORY_CARE_PROVIDER_SITE_OTHER): Payer: PPO | Admitting: Emergency Medicine

## 2019-09-17 DIAGNOSIS — I4901 Ventricular fibrillation: Secondary | ICD-10-CM

## 2019-09-17 DIAGNOSIS — I5033 Acute on chronic diastolic (congestive) heart failure: Secondary | ICD-10-CM

## 2019-09-17 DIAGNOSIS — I469 Cardiac arrest, cause unspecified: Secondary | ICD-10-CM

## 2019-09-17 LAB — CUP PACEART INCLINIC DEVICE CHECK
Date Time Interrogation Session: 20210722104748
HighPow Impedance: 45 Ohm
HighPow Impedance: 69 Ohm
Implantable Lead Implant Date: 20210709
Implantable Lead Implant Date: 20210709
Implantable Lead Location: 753859
Implantable Lead Location: 753860
Implantable Lead Model: 137
Implantable Lead Model: 7841
Implantable Lead Serial Number: 1063971
Implantable Lead Serial Number: 300935
Implantable Pulse Generator Implant Date: 20210709
Lead Channel Impedance Value: 514 Ohm
Lead Channel Impedance Value: 738 Ohm
Lead Channel Pacing Threshold Amplitude: 0.8 V
Lead Channel Pacing Threshold Amplitude: 0.8 V
Lead Channel Pacing Threshold Pulse Width: 0.4 ms
Lead Channel Pacing Threshold Pulse Width: 0.4 ms
Lead Channel Sensing Intrinsic Amplitude: 14 mV
Lead Channel Sensing Intrinsic Amplitude: 3.5 mV
Lead Channel Setting Pacing Amplitude: 3.5 V
Lead Channel Setting Pacing Amplitude: 3.5 V
Lead Channel Setting Pacing Pulse Width: 0.4 ms
Lead Channel Setting Sensing Sensitivity: 0.5 mV
Pulse Gen Serial Number: 224993

## 2019-09-17 NOTE — Telephone Encounter (Signed)
Called and spoke with Jackelyn Poling, she reports that she did not see where the pt had a Dx of NSTEMI for his cardiac rehab.She reports that they can cover his cardiac rehab with a dx of CHF.  Notified I could see the Dx of NSTEMI in the side bar with his cardiac problems. Also notified I was able to pull up his discharge summary from 09/05/19 and can see where Cherylann Ratel DO had put the dx of NSTEMI in his discharge diagnosis.  Jackelyn Poling states she will get back into epic and see if she can find it as well. Notified If they had any other questions to let us know.

## 2019-09-17 NOTE — Patient Instructions (Signed)
Driving restrictions discussed until 6 months after cardiac arrest per [place]. Patient verbalizes understanding.

## 2019-09-17 NOTE — Telephone Encounter (Signed)
She called stating that he was referred for nonstemi, but she doesn't see that he had a nonstemi, that is was back in April of 2010. Can do cardiac rehab for CHF.

## 2019-09-17 NOTE — Progress Notes (Signed)
Wound check appointment. Steri-strips removed. Wound without redness or edema. Incision edges approximated, wound well healed. Normal device function. Thresholds, sensing, and impedances consistent with implant measurements. Device programmed at 3.5V for extra safety margin until 3 month visit. Histogram distribution appropriate for patient and level of activity. No mode switches or ventricular arrhythmias noted. Patient educated about wound care, arm mobility, lifting restrictions, shock plan. ROV in 3 months with Dr. Lovena Le.

## 2019-09-25 DIAGNOSIS — Z720 Tobacco use: Secondary | ICD-10-CM | POA: Diagnosis not present

## 2019-09-25 DIAGNOSIS — I129 Hypertensive chronic kidney disease with stage 1 through stage 4 chronic kidney disease, or unspecified chronic kidney disease: Secondary | ICD-10-CM | POA: Diagnosis not present

## 2019-09-25 DIAGNOSIS — J449 Chronic obstructive pulmonary disease, unspecified: Secondary | ICD-10-CM | POA: Diagnosis not present

## 2019-09-25 DIAGNOSIS — E1122 Type 2 diabetes mellitus with diabetic chronic kidney disease: Secondary | ICD-10-CM | POA: Diagnosis not present

## 2019-09-30 ENCOUNTER — Ambulatory Visit: Payer: PPO | Admitting: Cardiovascular Disease

## 2019-09-30 ENCOUNTER — Encounter: Payer: Self-pay | Admitting: Cardiovascular Disease

## 2019-09-30 ENCOUNTER — Other Ambulatory Visit: Payer: Self-pay

## 2019-09-30 VITALS — BP 122/76 | HR 72 | Ht 68.0 in | Wt 164.0 lb

## 2019-09-30 DIAGNOSIS — I4892 Unspecified atrial flutter: Secondary | ICD-10-CM

## 2019-09-30 DIAGNOSIS — E782 Mixed hyperlipidemia: Secondary | ICD-10-CM | POA: Diagnosis not present

## 2019-09-30 DIAGNOSIS — I2510 Atherosclerotic heart disease of native coronary artery without angina pectoris: Secondary | ICD-10-CM | POA: Diagnosis not present

## 2019-09-30 DIAGNOSIS — I739 Peripheral vascular disease, unspecified: Secondary | ICD-10-CM

## 2019-09-30 DIAGNOSIS — Z720 Tobacco use: Secondary | ICD-10-CM

## 2019-09-30 DIAGNOSIS — I10 Essential (primary) hypertension: Secondary | ICD-10-CM | POA: Diagnosis not present

## 2019-09-30 MED ORDER — NITROGLYCERIN 0.4 MG SL SUBL: SUBLINGUAL_TABLET | SUBLINGUAL | 3 refills | Status: DC

## 2019-09-30 NOTE — Patient Instructions (Signed)
Medication Instructions:  CONTINUE CURRENT MEDICATIONS *If you need a refill on your cardiac medications before your next appointment, please call your pharmacy*   Lab Work: FASTING lipid/liver in 1-2 weeks  If you have labs (blood work) drawn today and your tests are completely normal, you will receive your results only by: Marland Kitchen MyChart Message (if you have MyChart) OR . A paper copy in the mail If you have any lab test that is abnormal or we need to change your treatment, we will call you to review the results.   Testing/Procedures: NONE   Follow-Up: At [facility], you and your health needs are our priority.  As part of our continuing mission to provide you with exceptional heart care, we have created designated Provider Care Teams.  These Care Teams include your primary Cardiologist (physician) and Advanced Practice Providers (APPs -  Physician Assistants and Nurse Practitioners) who all work together to provide you with the care you need, when you need it.  We recommend signing up for the patient portal called "MyChart".  Sign up information is provided on this After Visit Summary.  MyChart is used to connect with patients for Virtual Visits (Telemedicine).  Patients are able to view lab/test results, encounter notes, upcoming appointments, etc.  Non-urgent messages can be sent to your provider as well.   To learn more about what you can do with MyChart, go to [email or web address].    Your next appointment:   6 months with APP on Dr. [facility]  Kerin Ransom, PA-C  [place], [place]  Coletta Memos, [place]   12 months with Dr. Gwenlyn Found

## 2019-09-30 NOTE — Assessment & Plan Note (Signed)
Discontinue tobacco abuse in January of this year.

## 2019-09-30 NOTE — Assessment & Plan Note (Signed)
History of essential hypertension blood pressure measured today 122/76.  He is on carvedilol and losartan as well as spironolactone.

## 2019-09-30 NOTE — Assessment & Plan Note (Signed)
History of recent hospitalization for community-acquired pneumonia with ventricular fibrillation and arrest with documented A. fib/flutter currently on amiodarone and Eliquis maintaining sinus rhythm.

## 2019-09-30 NOTE — Assessment & Plan Note (Signed)
History of CAD status post non-STEMI April 2010.  I catheterized him revealing high-grade proximal LAD stenosis which I stented using a 2.75 mm x 18 mm long Xience drug-eluting stent.  I did jail the first diagonal branch and I performed angioplasty of this as well.  He had a dominant circumflex with an 80% distal AV groove stenosis in the nondominant RCA with normal LV function at that time.  He recently was admitted with community-acquired pneumonia and had witnessed VF arrest with severe LV dysfunction.  He underwent diagnostic coronary angiography by Dr. Haroldine Laws revealing a patent LAD stent with an occluded second marginal branch which apparently Dr. Angelena Form was unable to open.  Impressively, his EF improved from 20% up to 50 to 55%.  He currently denies chest pain or shortness of breath.

## 2019-09-30 NOTE — Assessment & Plan Note (Signed)
History of peripheral arterial disease status post remote aortobifemoral bypass grafting approximately 16 years ago with known occluded SFAs bilaterally with ABIs that run in the 0.5-0.6 range.

## 2019-09-30 NOTE — Progress Notes (Signed)
09/30/2019 Peter Lambert   1946/02/14  [id number]  Primary Physician Redmond School, MD Primary Cardiologist: Lorretta Harp MD FACP, [place], [place], [place]  HPI:  Peter Lambert is a 74 y.o.  mildly overweight divorced African American male, father of 2, grandfather to 4 grandchildren, who I last  saw in the office 06/03/2019.  He is accompanied by his son Peter Lambert today.  He had a non-ST-segment-elevation myocardial infarction in April 2010. I catheterized him revealing high-grade proximal LAD stenosis, which I stented using a 2.75 x 18 mm long Xience V drug-eluting stent. I did jail the first diagonal branch, performed angioplasty at the ostium, reducing an 80% stenosis to less than 40%. He had a dominant circumflex system with an 80% distal AV groove circumflex as well as a nondominant RCA. He had normal LV function. Abdominal aortogram revealed a patent aortobifemoral bypass graft, which was placed approximately16years ago. He has known occluded SFAs bilaterally with ABIs that run in the 0.5 or 0.6 range. He denies chest pain, shortness of breath, or claudication. He continues to smoke 3 packs of cigarettes per week despite counseling to the contrary. He did stop smoking for a while and then restarted back and a third pack a day.   He was admitted to the hospital on 04/23/2019 with acute respiratory failure.  He did leave AMA but came back and was discharged on 04/28/2019.  He had community-acquired pneumonia, COPD exacerbation and diastolic dysfunction.  He was diuresed from an initial weight of 175 down to 161 at discharge.  He has gained a moderate amount of weight since that time.  We talked about the importance of salt restriction and dietary modification.  He still is on 3 L of nasal oxygen which will be adjusted by his primary care provider.  He was admitted to [facility] on 08/29/2019 and was discharged 1 week later.  He had community-acquired pneumonia and had VF arrest which was  witnessed and resuscitated.  He underwent right left heart cath by Dr. Haroldine Laws revealing a patent proximal LAD stent and an occluded second marginal branch which Dr. Angelena Form was unable to open up.  His initial EF was 20% which improved to normal at the time of cath.  He was documented to have brief A. fib/a flutter and was placed on amiodarone and Eliquis oral anticoagulation.  Currently he is in sinus rhythm.  He last smoked back in January.  He currently denies chest pain or shortness of breath.    Current Meds  Medication Sig  . acetaminophen (TYLENOL) 325 MG tablet Take 2 tablets (650 mg total) by mouth every 6 (six) hours as needed for mild pain, fever or headache (or Fever >/= 101).  Marland Kitchen albuterol (VENTOLIN HFA) 108 (90 Base) MCG/ACT inhaler Inhale 2 puffs into the lungs every 4 (four) hours as needed for wheezing or shortness of breath.  Marland Kitchen amiodarone (PACERONE) 200 MG tablet Take 2 tablets (400 mg total) by mouth 2 (two) times daily for 14 days, THEN 2 tablets (400 mg total) daily for 14 days, THEN 1 tablet (200 mg total) daily.  Marland Kitchen apixaban (ELIQUIS) 5 MG TABS tablet Take 1 tablet (5 mg total) by mouth 2 (two) times daily.  Marland Kitchen aspirin EC 81 MG tablet Take 1 tablet (81 mg total) by mouth daily with breakfast.  . atorvastatin (LIPITOR) 80 MG tablet Place 1 tablet (80 mg total) into feeding tube daily.  . carvedilol (COREG) 3.125 MG tablet Take 1  tablet (3.125 mg total) by mouth 2 (two) times daily with a meal.  . colchicine 0.6 MG tablet Take 0.6 mg by mouth daily.   Marland Kitchen losartan (COZAAR) 25 MG tablet Take 1 tablet (25 mg total) by mouth daily.  . nitroGLYCERIN (NITROSTAT) 0.4 MG SL tablet PLACE 1 TABLET UNDER THE TONGUE EVERY 5 MINUTES AS NEEDED FOR CHEST PAIN  . polyethylene glycol (MIRALAX / GLYCOLAX) 17 g packet Take 17 g by mouth daily. For bowel (Patient taking differently: Take 17 g by mouth daily as needed for mild constipation. )  . spironolactone (ALDACTONE) 50 MG tablet Take 1 tablet  (50 mg total) by mouth daily.  . [DISCONTINUED] nitroGLYCERIN (NITROSTAT) 0.4 MG SL tablet PLACE 1 TABLET UNDER THE TONGUE EVERY 5 MINUTES AS NEEDED FOR CHEST PAIN (Patient taking differently: Place 0.4 mg under the tongue every 5 (five) minutes as needed for chest pain. )     No Known Allergies  Social History   Socioeconomic History  . Marital status: Divorced    Spouse name: Not on file  . Number of children: Not on file  . Years of education: Not on file  . Highest education level: Not on file  Occupational History  . Not on file  Tobacco Use  . Smoking status: Former Smoker    Packs/day: 0.50    Years: 52.00    Pack years: 26.00    Types: Cigarettes  . Smokeless tobacco: Never Used  Substance and Sexual Activity  . Alcohol use: No  . Drug use: No  . Sexual activity: Never  Other Topics Concern  . Not on file  Social History Narrative  . Not on file   Social Determinants of Health   Financial Resource Strain:   . Difficulty of Paying Living Expenses:   Food Insecurity:   . Worried About Charity fundraiser in the Last Year:   . Arboriculturist in the Last Year:   Transportation Needs:   . Film/video editor (Medical):   Marland Kitchen Lack of Transportation (Non-Medical):   Physical Activity:   . Days of Exercise per Week:   . Minutes of Exercise per Session:   Stress:   . Feeling of Stress :   Social Connections:   . Frequency of Communication with Friends and Family:   . Frequency of Social Gatherings with Friends and Family:   . Attends Religious Services:   . Active Member of Clubs or Organizations:   . Attends Archivist Meetings:   Marland Kitchen Marital Status:   Intimate Partner Violence:   . Fear of Current or Ex-Partner:   . Emotionally Abused:   Marland Kitchen Physically Abused:   . Sexually Abused:      Review of Systems: General: negative for chills, fever, night sweats or weight changes.  Cardiovascular: negative for chest pain, dyspnea on exertion, edema,  orthopnea, palpitations, paroxysmal nocturnal dyspnea or shortness of breath Dermatological: negative for rash Respiratory: negative for cough or wheezing Urologic: negative for hematuria Abdominal: negative for nausea, vomiting, diarrhea, bright red blood per rectum, melena, or hematemesis Neurologic: negative for visual changes, syncope, or dizziness All other systems reviewed and are otherwise negative except as noted above.    Blood pressure 122/76, pulse 72, height 5\' 8"  (1.727 m), weight 164 lb (74.4 kg).  General appearance: alert and no distress Neck: no adenopathy, no carotid bruit, no JVD, supple, symmetrical, trachea midline and thyroid not enlarged, symmetric, no tenderness/mass/nodules Lungs: clear to auscultation bilaterally  Heart: regular rate and rhythm, S1, S2 normal, no murmur, click, rub or gallop Extremities: extremities normal, atraumatic, no cyanosis or edema Pulses: 2+ and symmetric Skin: Skin color, texture, turgor normal. No rashes or lesions Neurologic: Alert and oriented X 3, normal strength and tone. Normal symmetric reflexes. Normal coordination and gait  EKG sinus rhythm at 72 with inferolateral T wave inversion, low limb voltage and early R wave transition.  I personally reviewed this EKG.  ASSESSMENT AND PLAN:   Peripheral arterial disease ([place]) History of peripheral arterial disease status post remote aortobifemoral bypass grafting approximately 16 years ago with known occluded SFAs bilaterally with ABIs that run in the 0.5-0.6 range.  Coronary artery disease s/p DES to LAD in 2010, s/p PCI to proximal LAD stenosis with DES this hospitalization History of CAD status post non-STEMI April 2010.  I catheterized him revealing high-grade proximal LAD stenosis which I stented using a 2.75 mm x 18 mm long Xience drug-eluting stent.  I did jail the first diagonal branch and I performed angioplasty of this as well.  He had a dominant circumflex with an 80% distal  AV groove stenosis in the nondominant RCA with normal LV function at that time.  He recently was admitted with community-acquired pneumonia and had witnessed VF arrest with severe LV dysfunction.  He underwent diagnostic coronary angiography by Dr. Haroldine Laws revealing a patent LAD stent with an occluded second marginal branch which apparently Dr. Angelena Form was unable to open.  Impressively, his EF improved from 20% up to 50 to 55%.  He currently denies chest pain or shortness of breath.  Essential hypertension History of essential hypertension blood pressure measured today 122/76.  He is on carvedilol and losartan as well as spironolactone.  Hyperlipidemia History of hyperlipidemia on high-dose atorvastatin.  His last lipid profile performed 05/07/2019 revealed total cholesterol 188, LDL 99 and HDL 61.  We will repeat a lipid and liver profile.  Tobacco use Discontinue tobacco abuse in January of this year.  Atrial flutter ([place]) History of recent hospitalization for community-acquired pneumonia with ventricular fibrillation and arrest with documented A. fib/flutter currently on amiodarone and Eliquis maintaining sinus rhythm.      Lorretta Harp MD FACP,FACC,FAHA, [facility] 09/30/2019 11:33 AM

## 2019-09-30 NOTE — Assessment & Plan Note (Signed)
History of hyperlipidemia on high-dose atorvastatin.  His last lipid profile performed 05/07/2019 revealed total cholesterol 188, LDL 99 and HDL 61.  We will repeat a lipid and liver profile.

## 2019-10-01 DIAGNOSIS — E782 Mixed hyperlipidemia: Secondary | ICD-10-CM | POA: Diagnosis not present

## 2019-10-02 LAB — LIPID PANEL
Chol/HDL Ratio: 2.6 ratio (ref 0.0–5.0)
Cholesterol, Total: 123 mg/dL (ref 100–199)
HDL: 47 mg/dL (ref >39)
LDL Chol Calc (NIH): 61 mg/dL (ref 0–99)
Triglycerides: 72 mg/dL (ref 0–149)
VLDL Cholesterol Cal: 15 mg/dL (ref 5–40)

## 2019-10-02 LAB — HEPATIC FUNCTION PANEL
ALT: 16 IU/L (ref 0–44)
AST: 23 IU/L (ref 0–40)
Albumin: 4.3 g/dL (ref 3.7–4.7)
Alkaline Phosphatase: 86 IU/L (ref 48–121)
Bilirubin Total: 0.2 mg/dL (ref 0.0–1.2)
Bilirubin, Direct: 0.1 mg/dL (ref 0.00–0.40)
Total Protein: 7 g/dL (ref 6.0–8.5)

## 2019-10-06 ENCOUNTER — Other Ambulatory Visit: Payer: Self-pay

## 2019-10-06 DIAGNOSIS — E872 Acidosis: Secondary | ICD-10-CM | POA: Diagnosis not present

## 2019-10-06 DIAGNOSIS — N179 Acute kidney failure, unspecified: Secondary | ICD-10-CM | POA: Diagnosis not present

## 2019-10-06 DIAGNOSIS — I70209 Unspecified atherosclerosis of native arteries of extremities, unspecified extremity: Secondary | ICD-10-CM | POA: Diagnosis not present

## 2019-10-06 DIAGNOSIS — Z48812 Encounter for surgical aftercare following surgery on the circulatory system: Secondary | ICD-10-CM | POA: Diagnosis not present

## 2019-10-06 DIAGNOSIS — I252 Old myocardial infarction: Secondary | ICD-10-CM | POA: Diagnosis not present

## 2019-10-06 DIAGNOSIS — J9601 Acute respiratory failure with hypoxia: Secondary | ICD-10-CM | POA: Diagnosis not present

## 2019-10-06 DIAGNOSIS — I0981 Rheumatic heart failure: Secondary | ICD-10-CM | POA: Diagnosis not present

## 2019-10-06 DIAGNOSIS — E785 Hyperlipidemia, unspecified: Secondary | ICD-10-CM | POA: Diagnosis not present

## 2019-10-06 DIAGNOSIS — E1122 Type 2 diabetes mellitus with diabetic chronic kidney disease: Secondary | ICD-10-CM | POA: Diagnosis not present

## 2019-10-06 DIAGNOSIS — I2510 Atherosclerotic heart disease of native coronary artery without angina pectoris: Secondary | ICD-10-CM | POA: Diagnosis not present

## 2019-10-06 DIAGNOSIS — I088 Other rheumatic multiple valve diseases: Secondary | ICD-10-CM | POA: Diagnosis not present

## 2019-10-06 DIAGNOSIS — D696 Thrombocytopenia, unspecified: Secondary | ICD-10-CM | POA: Diagnosis not present

## 2019-10-06 DIAGNOSIS — N1832 Chronic kidney disease, stage 3b: Secondary | ICD-10-CM | POA: Diagnosis not present

## 2019-10-06 DIAGNOSIS — I130 Hypertensive heart and chronic kidney disease with heart failure and stage 1 through stage 4 chronic kidney disease, or unspecified chronic kidney disease: Secondary | ICD-10-CM | POA: Diagnosis not present

## 2019-10-06 DIAGNOSIS — I4891 Unspecified atrial fibrillation: Secondary | ICD-10-CM | POA: Diagnosis not present

## 2019-10-06 DIAGNOSIS — I5021 Acute systolic (congestive) heart failure: Secondary | ICD-10-CM | POA: Diagnosis not present

## 2019-10-06 DIAGNOSIS — E876 Hypokalemia: Secondary | ICD-10-CM | POA: Diagnosis not present

## 2019-10-06 DIAGNOSIS — I4901 Ventricular fibrillation: Secondary | ICD-10-CM | POA: Diagnosis not present

## 2019-10-06 DIAGNOSIS — E1151 Type 2 diabetes mellitus with diabetic peripheral angiopathy without gangrene: Secondary | ICD-10-CM | POA: Diagnosis not present

## 2019-10-06 DIAGNOSIS — J449 Chronic obstructive pulmonary disease, unspecified: Secondary | ICD-10-CM | POA: Diagnosis not present

## 2019-10-06 DIAGNOSIS — D631 Anemia in chronic kidney disease: Secondary | ICD-10-CM | POA: Diagnosis not present

## 2019-10-06 DIAGNOSIS — I493 Ventricular premature depolarization: Secondary | ICD-10-CM | POA: Diagnosis not present

## 2019-10-06 DIAGNOSIS — J690 Pneumonitis due to inhalation of food and vomit: Secondary | ICD-10-CM | POA: Diagnosis not present

## 2019-10-06 DIAGNOSIS — D72829 Elevated white blood cell count, unspecified: Secondary | ICD-10-CM | POA: Diagnosis not present

## 2019-10-06 DIAGNOSIS — I4892 Unspecified atrial flutter: Secondary | ICD-10-CM | POA: Diagnosis not present

## 2019-10-06 MED ORDER — APIXABAN 5 MG PO TABS: 5.0000 mg | ORAL_TABLET | Freq: Two times a day (BID) | ORAL | 6 refills | Status: DC

## 2019-10-08 ENCOUNTER — Telehealth: Payer: Self-pay | Admitting: Cardiovascular Disease

## 2019-10-08 NOTE — Telephone Encounter (Signed)
    Pt said his PCP Dr. Gerarda Fraction cleared him to go back to work but also need a clearance from Dr. Gwenlyn Found. He said Dr. Gwenlyn Found need to send a note to DR. Fusco stating he can go back to work.

## 2019-10-09 NOTE — Telephone Encounter (Signed)
Follow up     Patient is calling because his leave of absence from work will expire on 10-12-19.  He need to know if Dr Gwenlyn Found will clear him to return to work or extend the leave.  Please call and let him know what Dr Gwenlyn Found want to do.

## 2019-10-09 NOTE — Telephone Encounter (Signed)
Spoke with pt, he is a Therapist, music. Aware dr berry out of the office and will not be back until next week. Will forward for dr berry's review and advise.

## 2019-10-11 NOTE — Telephone Encounter (Signed)
OK from my point of view to return to work

## 2019-10-12 NOTE — Telephone Encounter (Signed)
Patient called in to office in regards to when he can go back to work. Per Dr. Kennon Holter message as follows:  Lorretta Harp, MD  Cristopher Estimable, RN Yesterday (11:00 AM)     OK from my point of view to return to work      Documentation    Letter Printed and placed at front desk for pick up. Patient aware and verbalized understanding.

## 2019-10-12 NOTE — Telephone Encounter (Signed)
Follow up:     Patient calling to see if he is able to go back to work. Please call patient back. If no answer please call cell phone.

## 2019-10-27 DIAGNOSIS — E1122 Type 2 diabetes mellitus with diabetic chronic kidney disease: Secondary | ICD-10-CM | POA: Diagnosis not present

## 2019-10-27 DIAGNOSIS — I129 Hypertensive chronic kidney disease with stage 1 through stage 4 chronic kidney disease, or unspecified chronic kidney disease: Secondary | ICD-10-CM | POA: Diagnosis not present

## 2019-10-27 DIAGNOSIS — Z720 Tobacco use: Secondary | ICD-10-CM | POA: Diagnosis not present

## 2019-10-27 DIAGNOSIS — J449 Chronic obstructive pulmonary disease, unspecified: Secondary | ICD-10-CM | POA: Diagnosis not present

## 2019-11-05 ENCOUNTER — Telehealth: Payer: Self-pay | Admitting: Cardiovascular Disease

## 2019-11-05 ENCOUNTER — Other Ambulatory Visit (HOSPITAL_COMMUNITY): Payer: Self-pay | Admitting: Internal Medicine

## 2019-11-05 DIAGNOSIS — Z6824 Body mass index (BMI) 24.0-24.9, adult: Secondary | ICD-10-CM | POA: Diagnosis not present

## 2019-11-05 DIAGNOSIS — M1991 Primary osteoarthritis, unspecified site: Secondary | ICD-10-CM | POA: Diagnosis not present

## 2019-11-05 DIAGNOSIS — E1129 Type 2 diabetes mellitus with other diabetic kidney complication: Secondary | ICD-10-CM | POA: Diagnosis not present

## 2019-11-05 DIAGNOSIS — K219 Gastro-esophageal reflux disease without esophagitis: Secondary | ICD-10-CM | POA: Diagnosis not present

## 2019-11-05 DIAGNOSIS — R079 Chest pain, unspecified: Secondary | ICD-10-CM

## 2019-11-05 DIAGNOSIS — I209 Angina pectoris, unspecified: Secondary | ICD-10-CM | POA: Diagnosis not present

## 2019-11-05 NOTE — Telephone Encounter (Signed)
Discussed with calling provider. Added long-acting nitroglycerin.   Peter Lambert, [place]  [address], [place], [place] [address] [phone]  4:12 PM

## 2019-11-05 NOTE — Telephone Encounter (Signed)
New message:    [place] from [place] calling to speak to the DOD concering this patient Dr. Maryagnes Amos

## 2019-11-05 NOTE — Telephone Encounter (Signed)
Called them back- received EKG from [facility] (where patient was) Dr.Fusco spoke with Dr.O'Neal.

## 2019-11-11 DIAGNOSIS — Z23 Encounter for immunization: Secondary | ICD-10-CM | POA: Diagnosis not present

## 2019-11-25 ENCOUNTER — Other Ambulatory Visit: Payer: Self-pay | Admitting: Cardiovascular Disease

## 2019-11-26 ENCOUNTER — Other Ambulatory Visit: Payer: Self-pay | Admitting: Cardiovascular Disease

## 2019-11-26 DIAGNOSIS — J449 Chronic obstructive pulmonary disease, unspecified: Secondary | ICD-10-CM | POA: Diagnosis not present

## 2019-11-26 NOTE — Telephone Encounter (Signed)
*  STAT* If patient is at the pharmacy, call can be transferred to refill team.   1. Which medications need to be refilled? (please list name of each medication and dose if known) nitroGLYCERIN (NITROSTAT) 0.4 MG SL tablet  2. Which pharmacy/location (including street and city if local pharmacy) is medication to be sent to? [id number] - [place], [place] [address]  3. Do they need a 30 day or 90 day supply? 30 day   Patient is out of medication

## 2019-12-07 ENCOUNTER — Ambulatory Visit (INDEPENDENT_AMBULATORY_CARE_PROVIDER_SITE_OTHER): Payer: PPO

## 2019-12-07 DIAGNOSIS — I469 Cardiac arrest, cause unspecified: Secondary | ICD-10-CM | POA: Diagnosis not present

## 2019-12-07 DIAGNOSIS — I4901 Ventricular fibrillation: Secondary | ICD-10-CM | POA: Diagnosis not present

## 2019-12-07 LAB — CUP PACEART REMOTE DEVICE CHECK
Battery Remaining Longevity: 156 mo
Battery Remaining Percentage: 100 %
Brady Statistic RA Percent Paced: 16 %
Brady Statistic RV Percent Paced: 0 %
Date Time Interrogation Session: 20211011022200
HighPow Impedance: 56 Ohm
Implantable Lead Implant Date: 20210709
Implantable Lead Implant Date: 20210709
Implantable Lead Location: 753859
Implantable Lead Location: 753860
Implantable Lead Model: 137
Implantable Lead Model: 7841
Implantable Lead Serial Number: 1063971
Implantable Lead Serial Number: 300935
Implantable Pulse Generator Implant Date: 20210709
Lead Channel Impedance Value: 399 Ohm
Lead Channel Impedance Value: 684 Ohm
Lead Channel Setting Pacing Amplitude: 3.5 V
Lead Channel Setting Pacing Amplitude: 3.5 V
Lead Channel Setting Pacing Pulse Width: 0.4 ms
Lead Channel Setting Sensing Sensitivity: 0.5 mV
Pulse Gen Serial Number: 224993

## 2019-12-08 NOTE — Progress Notes (Signed)
Remote ICD transmission.   

## 2019-12-15 ENCOUNTER — Encounter: Payer: Self-pay | Admitting: Internal Medicine

## 2019-12-15 ENCOUNTER — Ambulatory Visit (INDEPENDENT_AMBULATORY_CARE_PROVIDER_SITE_OTHER): Payer: PPO | Admitting: Internal Medicine

## 2019-12-15 ENCOUNTER — Other Ambulatory Visit: Payer: Self-pay

## 2019-12-15 VITALS — BP 136/54 | HR 70 | Ht 68.0 in | Wt 171.0 lb

## 2019-12-15 DIAGNOSIS — I4901 Ventricular fibrillation: Secondary | ICD-10-CM

## 2019-12-15 DIAGNOSIS — I469 Cardiac arrest, cause unspecified: Secondary | ICD-10-CM | POA: Diagnosis not present

## 2019-12-15 DIAGNOSIS — Z95810 Presence of automatic (implantable) cardiac defibrillator: Secondary | ICD-10-CM | POA: Insufficient documentation

## 2019-12-15 LAB — CUP PACEART INCLINIC DEVICE CHECK
Date Time Interrogation Session: 20211019165844
HighPow Impedance: 45 Ohm
HighPow Impedance: 62 Ohm
Implantable Lead Implant Date: 20210709
Implantable Lead Implant Date: 20210709
Implantable Lead Location: 753859
Implantable Lead Location: 753860
Implantable Lead Model: 137
Implantable Lead Model: 7841
Implantable Lead Serial Number: 1063971
Implantable Lead Serial Number: 300935
Implantable Pulse Generator Implant Date: 20210709
Lead Channel Impedance Value: 409 Ohm
Lead Channel Impedance Value: 663 Ohm
Lead Channel Pacing Threshold Amplitude: 0.5 V
Lead Channel Pacing Threshold Amplitude: 0.7 V
Lead Channel Pacing Threshold Pulse Width: 0.4 ms
Lead Channel Pacing Threshold Pulse Width: 0.4 ms
Lead Channel Sensing Intrinsic Amplitude: 2.1 mV
Lead Channel Sensing Intrinsic Amplitude: 9.7 mV
Lead Channel Setting Pacing Amplitude: 2 V
Lead Channel Setting Pacing Amplitude: 2.2 V
Lead Channel Setting Pacing Pulse Width: 0.4 ms
Lead Channel Setting Sensing Sensitivity: 0.5 mV
Pulse Gen Serial Number: 224993

## 2019-12-15 NOTE — Patient Instructions (Signed)
Medication Instructions:  Your physician recommends that you continue on your current medications as directed. Please refer to the Current Medication list given to you today.  Labwork: None ordered.  Testing/Procedures: None ordered.  Follow-Up: Your physician wants you to follow-up in: one year with Dr. Lovena Le.   You will receive a reminder letter in the mail two months in advance. If you don't receive a letter, please call our office to schedule the follow-up appointment.  Remote monitoring is used to monitor your ICD from home. This monitoring reduces the number of office visits required to check your device to one time per year. It allows [place] to keep an eye on the functioning of your device to ensure it is working properly. You are scheduled for a device check from home on 03/07/2020. You may send your transmission at any time that day. If you have a wireless device, the transmission will be sent automatically. After your physician reviews your transmission, you will receive a postcard with your next transmission date.  Any Other Special Instructions Will Be Listed Below (If Applicable).  If you need a refill on your cardiac medications before your next appointment, please call your pharmacy.

## 2019-12-15 NOTE — Progress Notes (Signed)
HPI Peter Lambert returns after being treated for a VF arrest a couple of months ago. He is s/p ICD insertion. He denies chest pain or sob. Since ICD insertion, he denies chest pain or sob. He has some leg pain with exertion and has known ax/bifem disease. He has not had any problems with his ICD therapies. No Known Allergies   Current Outpatient Medications  Medication Sig Dispense Refill  . acetaminophen (TYLENOL) 325 MG tablet Take 2 tablets (650 mg total) by mouth every 6 (six) hours as needed for mild pain, fever or headache (or Fever >/= 101). 30 tablet 0  . albuterol (VENTOLIN HFA) 108 (90 Base) MCG/ACT inhaler Inhale 2 puffs into the lungs every 4 (four) hours as needed for wheezing or shortness of breath. 18 g 3  . aspirin EC 81 MG tablet Take 1 tablet (81 mg total) by mouth daily with breakfast. 30 tablet 2  . colchicine 0.6 MG tablet Take 0.6 mg by mouth daily.     . nitroGLYCERIN (NITROSTAT) 0.4 MG SL tablet PLACE 1 TABLET UNDER THE TONGUE EVERY 5 MINUTES AS NEEDED FOR CHEST PAIN 25 tablet 3  . polyethylene glycol (MIRALAX / GLYCOLAX) 17 g packet Take 17 g by mouth daily. For bowel (Patient taking differently: Take 17 g by mouth daily as needed for mild constipation. ) 30 each 5  . amiodarone (PACERONE) 200 MG tablet Take 2 tablets (400 mg total) by mouth 2 (two) times daily for 14 days, THEN 2 tablets (400 mg total) daily for 14 days, THEN 1 tablet (200 mg total) daily. 114 tablet 0  . apixaban (ELIQUIS) 5 MG TABS tablet Take 1 tablet (5 mg total) by mouth 2 (two) times daily. 60 tablet 6  . atorvastatin (LIPITOR) 80 MG tablet Place 1 tablet (80 mg total) into feeding tube daily. 30 tablet 0  . carvedilol (COREG) 3.125 MG tablet Take 1 tablet (3.125 mg total) by mouth 2 (two) times daily with a meal. 60 tablet 0  . losartan (COZAAR) 25 MG tablet Take 1 tablet (25 mg total) by mouth daily. 30 tablet 0  . spironolactone (ALDACTONE) 50 MG tablet Take 1 tablet (50 mg total) by mouth  daily. 30 tablet 0   No current facility-administered medications for this visit.     Past Medical History:  Diagnosis Date  . Coronary artery disease    a. s/p NSTEMI in 2010 with DES to proximal LAD with D1 jailed and angioplasty alone to ostium  . Hyperlipidemia   . Hypertension   . Hypertension   . Peripheral arterial disease ([place])   . Tobacco abuse   . Type 2 diabetes mellitus ([facility])     ROS:   All systems reviewed and negative except as noted in the HPI.   Past Surgical History:  Procedure Laterality Date  . CARDIAC SURGERY    . CORONARY STENT INTERVENTION N/A 09/03/2019   Procedure: CORONARY STENT INTERVENTION;  Surgeon: Burnell Blanks, MD;  Location: [facility];  Service: Cardiovascular;  Laterality: N/A;  . CORONARY STENT PLACEMENT    . ICD IMPLANT N/A 09/04/2019   Procedure: ICD IMPLANT;  Surgeon: Evans Lance, MD;  Location: [facility];  Service: Cardiovascular;  Laterality: N/A;  . RIGHT/LEFT HEART CATH AND CORONARY ANGIOGRAPHY N/A 09/03/2019   Procedure: RIGHT/LEFT HEART CATH AND CORONARY ANGIOGRAPHY;  Surgeon: Jolaine Artist, MD;  Location: [facility];  Service: Cardiovascular;  Laterality: N/A;  Family History  Problem Relation Age of Onset  . Cancer Mother      Social History   Socioeconomic History  . Marital status: Divorced    Spouse name: Not on file  . Number of children: Not on file  . Years of education: Not on file  . Highest education level: Not on file  Occupational History  . Not on file  Tobacco Use  . Smoking status: Former Smoker    Packs/day: 0.50    Years: 52.00    Pack years: 26.00    Types: Cigarettes  . Smokeless tobacco: Never Used  Substance and Sexual Activity  . Alcohol use: No  . Drug use: No  . Sexual activity: Never  Other Topics Concern  . Not on file  Social History Narrative  . Not on file   Social Determinants of Health   Financial Resource Strain:   . Difficulty  of Paying Living Expenses: Not on file  Food Insecurity:   . Worried About Charity fundraiser in the Last Year: Not on file  . Ran Out of Food in the Last Year: Not on file  Transportation Needs:   . Lack of Transportation (Medical): Not on file  . Lack of Transportation (Non-Medical): Not on file  Physical Activity:   . Days of Exercise per Week: Not on file  . Minutes of Exercise per Session: Not on file  Stress:   . Feeling of Stress : Not on file  Social Connections:   . Frequency of Communication with Friends and Family: Not on file  . Frequency of Social Gatherings with Friends and Family: Not on file  . Attends Religious Services: Not on file  . Active Member of Clubs or Organizations: Not on file  . Attends Archivist Meetings: Not on file  . Marital Status: Not on file  Intimate Partner Violence:   . Fear of Current or Ex-Partner: Not on file  . Emotionally Abused: Not on file  . Physically Abused: Not on file  . Sexually Abused: Not on file     BP (!) 136/54   Pulse 70   Ht 5\' 8"  (1.727 m)   Wt 171 lb (77.6 kg)   SpO2 99%   BMI 26.00 kg/m   Physical Exam:  Well appearing NAD HEENT: Unremarkable Neck:  No JVD, no thyromegally Lymphatics:  No adenopathy Back:  No CVA tenderness Lungs:  Clear with no wheezes HEART:  Regular rate rhythm, no murmurs, no rubs, no clicks Abd:  soft, positive bowel sounds, no organomegally, no rebound, no guarding Ext:  2 plus pulses, no edema, no cyanosis, no clubbing Skin:  No rashes no nodules Neuro:  CN II through XII intact, motor grossly intact  EKG - NSR with NSSTT changes.  DEVICE  Normal device function.  See PaceArt for details.   Assess/Plan: 1. VF arrest - he has had no recurrent episodes. No change in meds. He will continue low dose amiodarone. 2. NSTEMI - He denies anginal symptoms. No change in meds. 3. PAD - he has minimal claudication. He will continue his current meds.  Carleene Overlie Ralene Gasparyan,MD

## 2019-12-23 DIAGNOSIS — R251 Tremor, unspecified: Secondary | ICD-10-CM | POA: Diagnosis not present

## 2019-12-23 DIAGNOSIS — Z6826 Body mass index (BMI) 26.0-26.9, adult: Secondary | ICD-10-CM | POA: Diagnosis not present

## 2019-12-23 DIAGNOSIS — E118 Type 2 diabetes mellitus with unspecified complications: Secondary | ICD-10-CM | POA: Diagnosis not present

## 2019-12-23 DIAGNOSIS — L209 Atopic dermatitis, unspecified: Secondary | ICD-10-CM | POA: Diagnosis not present

## 2019-12-26 DIAGNOSIS — E1122 Type 2 diabetes mellitus with diabetic chronic kidney disease: Secondary | ICD-10-CM | POA: Diagnosis not present

## 2019-12-26 DIAGNOSIS — J449 Chronic obstructive pulmonary disease, unspecified: Secondary | ICD-10-CM | POA: Diagnosis not present

## 2019-12-26 DIAGNOSIS — I129 Hypertensive chronic kidney disease with stage 1 through stage 4 chronic kidney disease, or unspecified chronic kidney disease: Secondary | ICD-10-CM | POA: Diagnosis not present

## 2019-12-26 DIAGNOSIS — F17210 Nicotine dependence, cigarettes, uncomplicated: Secondary | ICD-10-CM | POA: Diagnosis not present

## 2020-01-04 ENCOUNTER — Encounter (HOSPITAL_COMMUNITY): Payer: Self-pay

## 2020-01-04 ENCOUNTER — Emergency Department (HOSPITAL_COMMUNITY): Payer: PPO

## 2020-01-04 ENCOUNTER — Inpatient Hospital Stay (HOSPITAL_COMMUNITY)
Admission: EM | Admit: 2020-01-04 | Discharge: 2020-01-08 | DRG: 378 | Disposition: A | Discharge status: Home Health | Payer: PPO | Attending: Family Medicine | Admitting: Family Medicine

## 2020-01-04 ENCOUNTER — Other Ambulatory Visit: Payer: Self-pay

## 2020-01-04 DIAGNOSIS — I10 Essential (primary) hypertension: Secondary | ICD-10-CM | POA: Diagnosis not present

## 2020-01-04 DIAGNOSIS — K5730 Diverticulosis of large intestine without perforation or abscess without bleeding: Secondary | ICD-10-CM | POA: Diagnosis present

## 2020-01-04 DIAGNOSIS — I951 Orthostatic hypotension: Secondary | ICD-10-CM

## 2020-01-04 DIAGNOSIS — Z79899 Other long term (current) drug therapy: Secondary | ICD-10-CM

## 2020-01-04 DIAGNOSIS — I5022 Chronic systolic (congestive) heart failure: Secondary | ICD-10-CM | POA: Diagnosis present

## 2020-01-04 DIAGNOSIS — R531 Weakness: Secondary | ICD-10-CM | POA: Diagnosis not present

## 2020-01-04 DIAGNOSIS — K625 Hemorrhage of anus and rectum: Secondary | ICD-10-CM | POA: Diagnosis not present

## 2020-01-04 DIAGNOSIS — K2289 Other specified disease of esophagus: Secondary | ICD-10-CM | POA: Diagnosis not present

## 2020-01-04 DIAGNOSIS — K449 Diaphragmatic hernia without obstruction or gangrene: Secondary | ICD-10-CM

## 2020-01-04 DIAGNOSIS — R9431 Abnormal electrocardiogram [ECG] [EKG]: Secondary | ICD-10-CM

## 2020-01-04 DIAGNOSIS — E782 Mixed hyperlipidemia: Secondary | ICD-10-CM

## 2020-01-04 DIAGNOSIS — M109 Gout, unspecified: Secondary | ICD-10-CM | POA: Diagnosis present

## 2020-01-04 DIAGNOSIS — Z7982 Long term (current) use of aspirin: Secondary | ICD-10-CM

## 2020-01-04 DIAGNOSIS — K31819 Angiodysplasia of stomach and duodenum without bleeding: Secondary | ICD-10-CM | POA: Diagnosis present

## 2020-01-04 DIAGNOSIS — K648 Other hemorrhoids: Secondary | ICD-10-CM | POA: Diagnosis present

## 2020-01-04 DIAGNOSIS — K5900 Constipation, unspecified: Secondary | ICD-10-CM | POA: Diagnosis present

## 2020-01-04 DIAGNOSIS — D62 Acute posthemorrhagic anemia: Secondary | ICD-10-CM

## 2020-01-04 DIAGNOSIS — E871 Hypo-osmolality and hyponatremia: Secondary | ICD-10-CM | POA: Diagnosis not present

## 2020-01-04 DIAGNOSIS — I5020 Unspecified systolic (congestive) heart failure: Secondary | ICD-10-CM | POA: Diagnosis not present

## 2020-01-04 DIAGNOSIS — I2510 Atherosclerotic heart disease of native coronary artery without angina pectoris: Secondary | ICD-10-CM

## 2020-01-04 DIAGNOSIS — E1122 Type 2 diabetes mellitus with diabetic chronic kidney disease: Secondary | ICD-10-CM | POA: Diagnosis present

## 2020-01-04 DIAGNOSIS — I429 Cardiomyopathy, unspecified: Secondary | ICD-10-CM | POA: Diagnosis not present

## 2020-01-04 DIAGNOSIS — N179 Acute kidney failure, unspecified: Secondary | ICD-10-CM | POA: Diagnosis not present

## 2020-01-04 DIAGNOSIS — E1165 Type 2 diabetes mellitus with hyperglycemia: Secondary | ICD-10-CM | POA: Diagnosis not present

## 2020-01-04 DIAGNOSIS — E8809 Other disorders of plasma-protein metabolism, not elsewhere classified: Secondary | ICD-10-CM | POA: Diagnosis not present

## 2020-01-04 DIAGNOSIS — Z9981 Dependence on supplemental oxygen: Secondary | ICD-10-CM

## 2020-01-04 DIAGNOSIS — D500 Iron deficiency anemia secondary to blood loss (chronic): Secondary | ICD-10-CM | POA: Diagnosis not present

## 2020-01-04 DIAGNOSIS — N1831 Chronic kidney disease, stage 3a: Secondary | ICD-10-CM | POA: Diagnosis not present

## 2020-01-04 DIAGNOSIS — R55 Syncope and collapse: Secondary | ICD-10-CM | POA: Diagnosis not present

## 2020-01-04 DIAGNOSIS — R0689 Other abnormalities of breathing: Secondary | ICD-10-CM | POA: Diagnosis not present

## 2020-01-04 DIAGNOSIS — W19XXXA Unspecified fall, initial encounter: Secondary | ICD-10-CM | POA: Diagnosis not present

## 2020-01-04 DIAGNOSIS — E875 Hyperkalemia: Secondary | ICD-10-CM

## 2020-01-04 DIAGNOSIS — E1151 Type 2 diabetes mellitus with diabetic peripheral angiopathy without gangrene: Secondary | ICD-10-CM | POA: Diagnosis present

## 2020-01-04 DIAGNOSIS — I130 Hypertensive heart and chronic kidney disease with heart failure and stage 1 through stage 4 chronic kidney disease, or unspecified chronic kidney disease: Secondary | ICD-10-CM | POA: Diagnosis not present

## 2020-01-04 DIAGNOSIS — Z95810 Presence of automatic (implantable) cardiac defibrillator: Secondary | ICD-10-CM | POA: Diagnosis present

## 2020-01-04 DIAGNOSIS — E119 Type 2 diabetes mellitus without complications: Secondary | ICD-10-CM

## 2020-01-04 DIAGNOSIS — Z20822 Contact with and (suspected) exposure to covid-19: Secondary | ICD-10-CM | POA: Diagnosis present

## 2020-01-04 DIAGNOSIS — Z87891 Personal history of nicotine dependence: Secondary | ICD-10-CM

## 2020-01-04 DIAGNOSIS — K649 Unspecified hemorrhoids: Secondary | ICD-10-CM | POA: Diagnosis not present

## 2020-01-04 DIAGNOSIS — D649 Anemia, unspecified: Secondary | ICD-10-CM

## 2020-01-04 DIAGNOSIS — E785 Hyperlipidemia, unspecified: Secondary | ICD-10-CM | POA: Diagnosis not present

## 2020-01-04 DIAGNOSIS — I482 Chronic atrial fibrillation, unspecified: Secondary | ICD-10-CM

## 2020-01-04 DIAGNOSIS — R195 Other fecal abnormalities: Secondary | ICD-10-CM | POA: Diagnosis not present

## 2020-01-04 DIAGNOSIS — E860 Dehydration: Secondary | ICD-10-CM | POA: Diagnosis not present

## 2020-01-04 DIAGNOSIS — J449 Chronic obstructive pulmonary disease, unspecified: Secondary | ICD-10-CM | POA: Diagnosis not present

## 2020-01-04 DIAGNOSIS — E538 Deficiency of other specified B group vitamins: Secondary | ICD-10-CM | POA: Diagnosis not present

## 2020-01-04 DIAGNOSIS — R296 Repeated falls: Secondary | ICD-10-CM | POA: Diagnosis present

## 2020-01-04 DIAGNOSIS — K922 Gastrointestinal hemorrhage, unspecified: Secondary | ICD-10-CM | POA: Diagnosis not present

## 2020-01-04 DIAGNOSIS — R739 Hyperglycemia, unspecified: Secondary | ICD-10-CM | POA: Diagnosis present

## 2020-01-04 DIAGNOSIS — Z7901 Long term (current) use of anticoagulants: Secondary | ICD-10-CM | POA: Diagnosis not present

## 2020-01-04 DIAGNOSIS — Z955 Presence of coronary angioplasty implant and graft: Secondary | ICD-10-CM

## 2020-01-04 DIAGNOSIS — I252 Old myocardial infarction: Secondary | ICD-10-CM | POA: Diagnosis not present

## 2020-01-04 DIAGNOSIS — R0902 Hypoxemia: Secondary | ICD-10-CM | POA: Diagnosis not present

## 2020-01-04 DIAGNOSIS — K222 Esophageal obstruction: Secondary | ICD-10-CM | POA: Diagnosis not present

## 2020-01-04 DIAGNOSIS — D509 Iron deficiency anemia, unspecified: Secondary | ICD-10-CM | POA: Diagnosis not present

## 2020-01-04 DIAGNOSIS — I4891 Unspecified atrial fibrillation: Secondary | ICD-10-CM | POA: Diagnosis not present

## 2020-01-04 LAB — CBC WITH DIFFERENTIAL/PLATELET
Abs Immature Granulocytes: 0.44 10*3/uL — ABNORMAL HIGH (ref 0.00–0.07)
Basophils Absolute: 0.1 10*3/uL (ref 0.0–0.1)
Basophils Relative: 0 %
Eosinophils Absolute: 0.2 10*3/uL (ref 0.0–0.5)
Eosinophils Relative: 1 %
HCT: 17.6 % — ABNORMAL LOW (ref 39.0–52.0)
Hemoglobin: 4.9 g/dL — CL (ref 13.0–17.0)
Immature Granulocytes: 3 %
Lymphocytes Relative: 6 %
Lymphs Abs: 0.7 10*3/uL (ref 0.7–4.0)
MCH: 24.1 pg — ABNORMAL LOW (ref 26.0–34.0)
MCHC: 27.8 g/dL — ABNORMAL LOW (ref 30.0–36.0)
MCV: 86.7 fL (ref 80.0–100.0)
Monocytes Absolute: 2 10*3/uL — ABNORMAL HIGH (ref 0.1–1.0)
Monocytes Relative: 15 %
Neutro Abs: 9.9 10*3/uL — ABNORMAL HIGH (ref 1.7–7.7)
Neutrophils Relative %: 75 %
Platelets: 352 10*3/uL (ref 150–400)
RBC: 2.03 MIL/uL — ABNORMAL LOW (ref 4.22–5.81)
RDW: 18.4 % — ABNORMAL HIGH (ref 11.5–15.5)
WBC: 13.3 10*3/uL — ABNORMAL HIGH (ref 4.0–10.5)
nRBC: 1 % — ABNORMAL HIGH (ref 0.0–0.2)

## 2020-01-04 LAB — COMPREHENSIVE METABOLIC PANEL
ALT: 37 U/L (ref 0–44)
AST: 30 U/L (ref 15–41)
Albumin: 3.2 g/dL — ABNORMAL LOW (ref 3.5–5.0)
Alkaline Phosphatase: 75 U/L (ref 38–126)
Anion gap: 10 (ref 5–15)
BUN: 41 mg/dL — ABNORMAL HIGH (ref 8–23)
CO2: 20 mmol/L — ABNORMAL LOW (ref 22–32)
Calcium: 8.2 mg/dL — ABNORMAL LOW (ref 8.9–10.3)
Chloride: 102 mmol/L (ref 98–111)
Creatinine, Ser: 2.41 mg/dL — ABNORMAL HIGH (ref 0.61–1.24)
GFR, Estimated: 27 mL/min — ABNORMAL LOW (ref >60)
Glucose, Bld: 148 mg/dL — ABNORMAL HIGH (ref 70–99)
Potassium: 6.1 mmol/L — ABNORMAL HIGH (ref 3.5–5.1)
Sodium: 132 mmol/L — ABNORMAL LOW (ref 135–145)
Total Bilirubin: 0.3 mg/dL (ref 0.3–1.2)
Total Protein: 7.2 g/dL (ref 6.5–8.1)

## 2020-01-04 LAB — PREPARE RBC (CROSSMATCH)

## 2020-01-04 LAB — POC OCCULT BLOOD, ED: Fecal Occult Bld: POSITIVE — AB

## 2020-01-04 LAB — RESPIRATORY PANEL BY RT PCR (FLU A&B, COVID)
Influenza A by PCR: NEGATIVE
Influenza B by PCR: NEGATIVE
SARS Coronavirus 2 by RT PCR: NEGATIVE

## 2020-01-04 IMAGING — DX DG CHEST 2V
2 series · 2 of 2 positions shown · non-contrast
Comparison: [DATE]

CLINICAL DATA: Weakness.  Syncope.

EXAM:
CHEST - 2 VIEW

[chest lat]
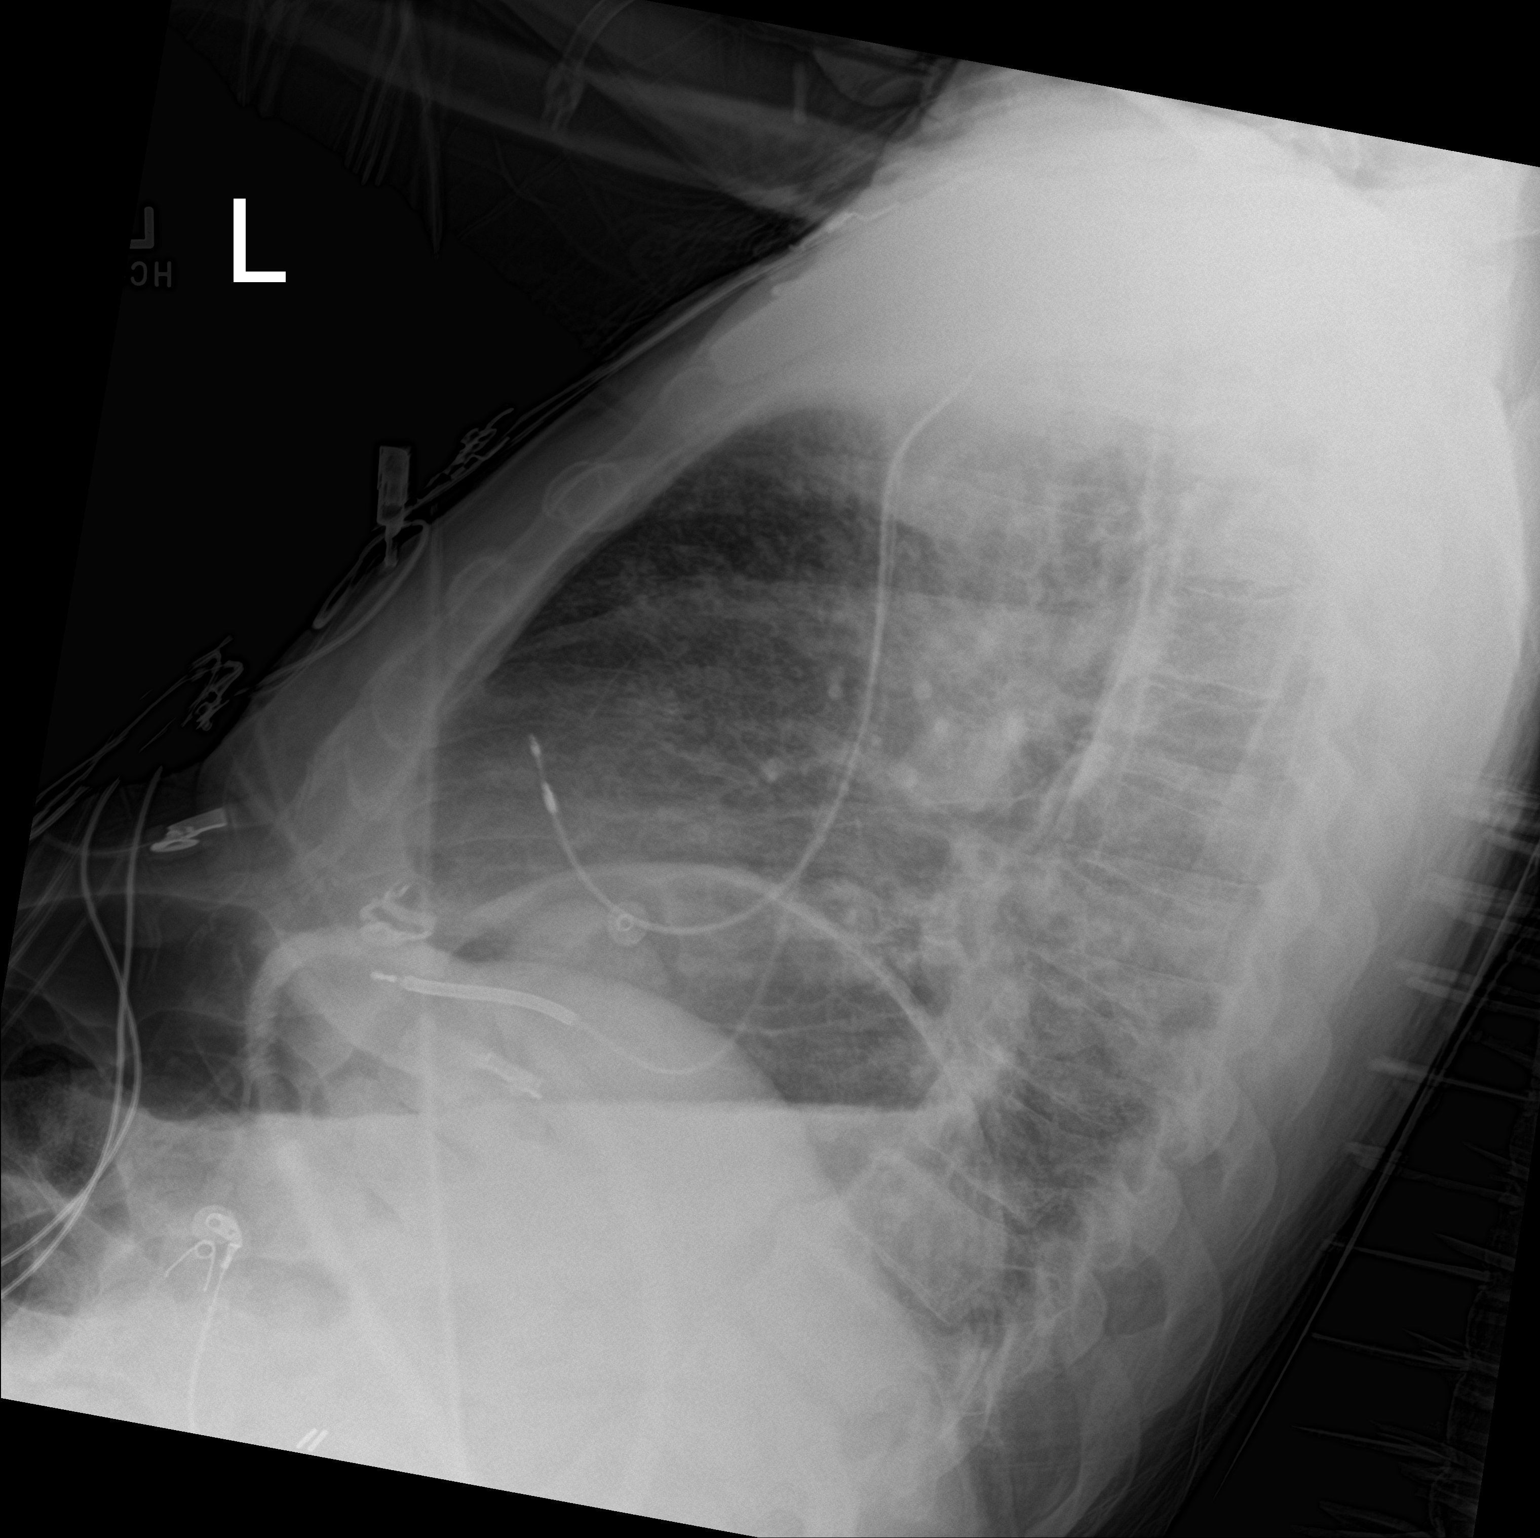

[chest ap]
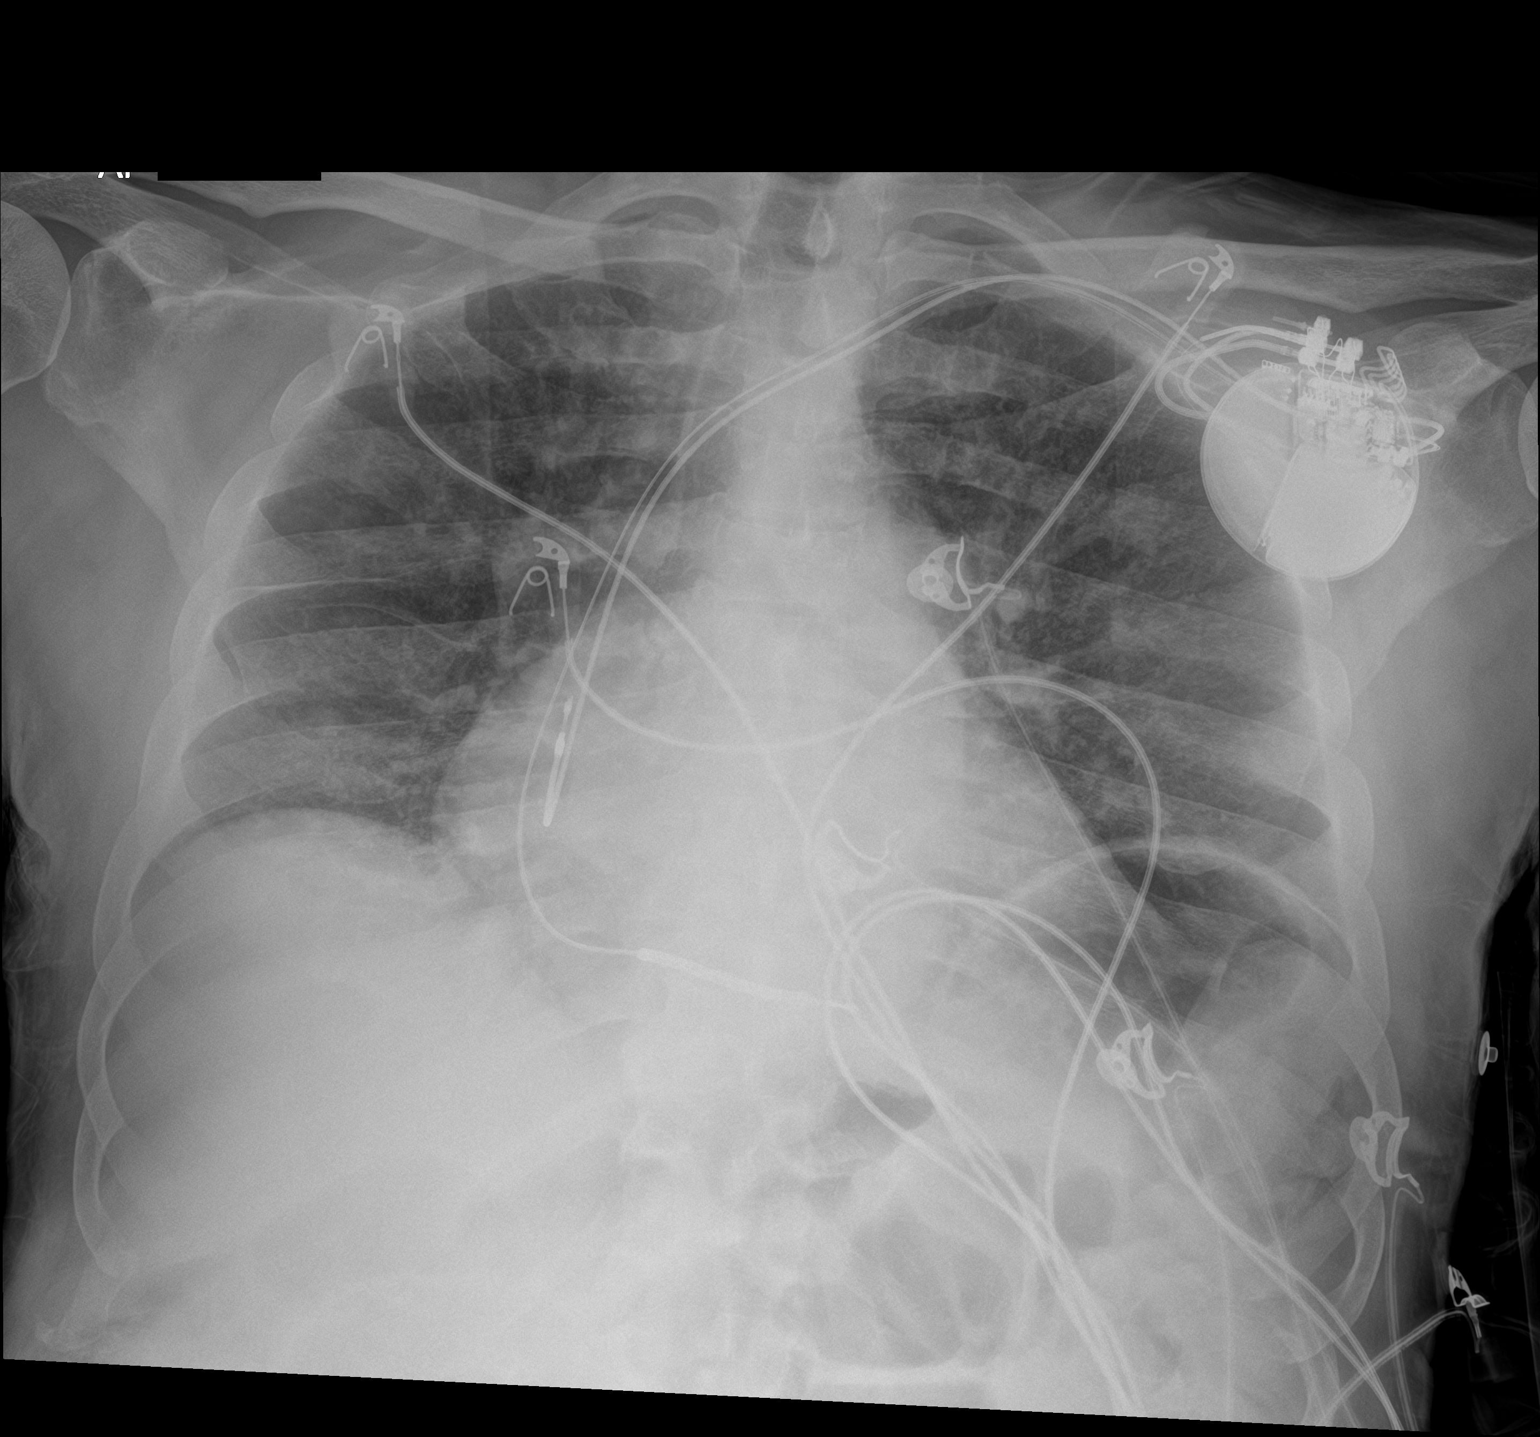

[2 of 2 positions shown; findings below may reference images not displayed]

FINDINGS: There is a dual chamber pacemaker/ICD in place. The lung volumes are
low. The heart size is stable. There is mild vascular congestion.
There is a small amount of fluid in the fissures. There is no
pneumothorax.
IMPRESSION: Mild vascular congestion and trace pleural fluid.

## 2020-01-04 MED ORDER — SODIUM CHLORIDE 0.9 % IV SOLN
10.0000 mL/h | Freq: Once | INTRAVENOUS | Status: AC
  Administered 2020-01-05: 10 mL/h via INTRAVENOUS

## 2020-01-04 MED ORDER — SODIUM ZIRCONIUM CYCLOSILICATE 5 G PO PACK
5.0000 g | PACK | Freq: Once | ORAL | Status: AC
  Administered 2020-01-04: 5 g via ORAL
  Filled 2020-01-04: qty 1

## 2020-01-04 MED ORDER — CALCIUM GLUCONATE-NACL 1-0.675 GM/50ML-% IV SOLN
1.0000 g | Freq: Once | INTRAVENOUS | Status: AC
  Administered 2020-01-05: 1000 mg via INTRAVENOUS
  Filled 2020-01-04: qty 50

## 2020-01-04 MED ORDER — FUROSEMIDE 10 MG/ML IJ SOLN: 20.0000 mg | Freq: Once | INTRAMUSCULAR | Status: DC

## 2020-01-04 MED ORDER — SODIUM CHLORIDE 0.9 % IV BOLUS
1000.0000 mL | Freq: Once | INTRAVENOUS | Status: AC
  Administered 2020-01-04: 1000 mL via INTRAVENOUS

## 2020-01-04 NOTE — ED Triage Notes (Signed)
Pt brought to ED via [facility] for near syncope episode. Pt with increased weakness x 1 week.

## 2020-01-04 NOTE — H&P (Signed)
History and Physical  Peter Lambert UDJ:[id number] DOB: [birth year] DOA: 01/04/2020  Referring physician: Christ Kick, MD PCP: Redmond School, MD  Patient coming from: Home  Chief Complaint: Generalized weakness and near fainting  HPI: Peter Lambert is a 74 y.o. male with medical history significant for COPD, CAD, HFrEF (LVEF 20% 08/30/2022), hypertension, hyperlipidemia, A. fib on Eliquis, CKD 3 who presents to the emergency department due to 2-3 weeks of generalized weakness with occasional imbalance.  Patient states that he had a fall yesterday (11/7) due to weakness without hurting himself, he states that he almost fell again today, but he was able to catch himself prior to falling.  Patient's son insisted that he should go to the ED for further evaluation.  He had a V. fib with cardiac arrest in July of this year and was hospitalized at [facility] where a [place] defibrillator was placed.  He denies near fainting episode, headache, fever, chills, nausea, vomiting.  Patient denies noting blood in stool prior to arrival to the ED.  ED Course:  In the emergency department, SBP was in low 100s, orthostatic BP was positive from sitting to standing.  Work-up in the ED showed H/H 4.9/17.6 with MCV of 86.7, hyponatremia, hyperkalemia, BUN to creatinine 41/2.41 (baseline creatinine around 1.3-1.5), hyperglycemia, hypoalbuminemia.  Stool occult blood was positive.  Chest x-ray showed mild vascular congestion and trace pleural fluid.  IV hydration with NS was given, Lokelma x1 was given and IV calcium gluconate due to hyperkalemia was given.  Type and screen and 2 units of blood was ordered.  Hospitalist was asked to admit patient for further evaluation and management.  Review of Systems: Constitutional: Positive for generalized weakness.  Negative for chills and fever.  HENT: Negative for ear pain and sore throat.   Eyes: Negative for pain and visual disturbance.  Respiratory: Negative  for cough, chest tightness and shortness of breath.   Cardiovascular: Negative for chest pain and palpitations.  Gastrointestinal: Negative for abdominal pain and vomiting.  Endocrine: Negative for polyphagia and polyuria.  Genitourinary: Negative for decreased urine volume, dysuria, enuresis, hematuria Musculoskeletal: Positive for fall x1.  Negative for arthralgias and back pain.  Skin: Negative for color change and rash.  Allergic/Immunologic: Negative for immunocompromised state.  Neurological: Negative for tremors, syncope, speech difficulty, weakness, light-headedness and headaches.  Hematological: Does not bruise/bleed easily.  All other systems reviewed and are negative   Past Medical History:  Diagnosis Date  . Coronary artery disease    a. s/p NSTEMI in 2010 with DES to proximal LAD with D1 jailed and angioplasty alone to ostium  . Hyperlipidemia   . Hypertension   . Hypertension   . Peripheral arterial disease ([place])   . Tobacco abuse   . Type 2 diabetes mellitus ([place])    Past Surgical History:  Procedure Laterality Date  . CARDIAC SURGERY    . CORONARY STENT INTERVENTION N/A 09/03/2019   Procedure: CORONARY STENT INTERVENTION;  Surgeon: Burnell Blanks, MD;  Location: [facility];  Service: Cardiovascular;  Laterality: N/A;  . CORONARY STENT PLACEMENT    . ICD IMPLANT N/A 09/04/2019   Procedure: ICD IMPLANT;  Surgeon: Evans Lance, MD;  Location: [facility];  Service: Cardiovascular;  Laterality: N/A;  . RIGHT/LEFT HEART CATH AND CORONARY ANGIOGRAPHY N/A 09/03/2019   Procedure: RIGHT/LEFT HEART CATH AND CORONARY ANGIOGRAPHY;  Surgeon: Jolaine Artist, MD;  Location: [facility];  Service: Cardiovascular;  Laterality: N/A;  Social History:  reports that he has quit smoking. His smoking use included cigarettes. He has a 26.00 pack-year smoking history. He has never used smokeless tobacco. He reports that he does not drink alcohol and does  not use drugs.   No Known Allergies  Family History  Problem Relation Age of Onset  . Cancer Mother      Prior to Admission medications   Medication Sig Start Date End Date Taking? Authorizing Provider  acetaminophen (TYLENOL) 325 MG tablet Take 2 tablets (650 mg total) by mouth every 6 (six) hours as needed for mild pain, fever or headache (or Fever >/= 101). 04/28/19  Yes Emokpae, Courage, MD  albuterol (VENTOLIN HFA) 108 (90 Base) MCG/ACT inhaler Inhale 2 puffs into the lungs every 4 (four) hours as needed for wheezing or shortness of breath. 04/28/19  Yes Emokpae, Courage, MD  apixaban (ELIQUIS) 5 MG TABS tablet Take 1 tablet (5 mg total) by mouth 2 (two) times daily. 10/06/19 01/04/20 Yes Lorretta Harp, MD  aspirin EC 81 MG tablet Take 1 tablet (81 mg total) by mouth daily with breakfast. 04/28/19 04/27/20 Yes Emokpae, Courage, MD  atorvastatin (LIPITOR) 80 MG tablet Place 1 tablet (80 mg total) into feeding tube daily. 09/06/19 01/04/20 Yes Kyle, Tyrone A, DO  colchicine 0.6 MG tablet Take 0.6 mg by mouth daily.  05/23/18  Yes [provider]  guaiFENesin (MUCINEX) 600 MG 12 hr tablet Take 600 mg by mouth.   Yes [provider]  losartan (COZAAR) 25 MG tablet Take 1 tablet (25 mg total) by mouth daily. 09/06/19 01/04/20 Yes Kyle, Tyrone A, DO  nitroGLYCERIN (NITROSTAT) 0.4 MG SL tablet PLACE 1 TABLET UNDER THE TONGUE EVERY 5 MINUTES AS NEEDED FOR CHEST PAIN Patient taking differently: Place 0.4 mg under the tongue every 5 (five) minutes as needed. PLACE 1 TABLET UNDER THE TONGUE EVERY 5 MINUTES AS NEEDED FOR CHEST PAIN 11/26/19  Yes Lorretta Harp, MD  polyethylene glycol (MIRALAX / GLYCOLAX) 17 g packet Take 17 g by mouth daily. For bowel Patient taking differently: Take 17 g by mouth daily as needed for mild constipation.  04/28/19  Yes Roxan Hockey, MD  amiodarone (PACERONE) 200 MG tablet Take 2 tablets (400 mg total) by mouth 2 (two) times daily for 14 days, THEN 2  tablets (400 mg total) daily for 14 days, THEN 1 tablet (200 mg total) daily. Patient not taking: Reported on 01/04/2020 09/05/19 11/02/19  Cherylann Ratel A, DO  carvedilol (COREG) 3.125 MG tablet Take 1 tablet (3.125 mg total) by mouth 2 (two) times daily with a meal. 09/05/19 10/05/19  Marylyn Ishihara, Tyrone A, DO  fluocinonide cream (LIDEX) 8.03 % Apply 1 application topically 2 (two) times daily. 12/23/19   [provider]  spironolactone (ALDACTONE) 50 MG tablet Take 1 tablet (50 mg total) by mouth daily. Patient not taking: Reported on 01/04/2020 09/06/19 10/06/19  Cherylann Ratel A, DO  potassium chloride (KLOR-CON) 20 MEQ packet TAKE 1 TABLET BY MOUTH EVERY DAY 12/24/12 07/14/13  Lorretta Harp, MD    Physical Exam: BP (!) 138/56   Pulse (!) 59   Temp 97.7 F (36.5 C) (Oral)   Resp 16   Ht 5\' 8"  (1.727 m)   Wt 68.5 kg   SpO2 100%   BMI 22.96 kg/m   . General: 74 y.o. year-old male well developed well nourished in no acute distress.  Alert and oriented x3. Marland Kitchen HEENT: NCAT, EOMI . Neck: Supple, trachea medial .  Cardiovascular: AICD insertion site noted.  Regular rate and rhythm with no rubs or gallops.  No thyromegaly or JVD noted.  No lower extremity edema. 2/4 pulses in all 4 extremities. Marland Kitchen Respiratory: Clear to auscultation with no wheezes or rales. Good inspiratory effort. . Abdomen: Soft nontender nondistended with normal bowel sounds x4 quadrants. . Muskuloskeletal: No cyanosis, clubbing or edema noted bilaterally . Neuro: CN II-XII intact, strength, sensation, reflexes . Skin: No ulcerative lesions noted or rashes . Psychiatry: Judgement and insight appear normal. Mood is appropriate for condition and setting          Labs on Admission:  Basic Metabolic Panel: Recent Labs  Lab 01/04/20 1923  NA 132*  K 6.1*  CL 102  CO2 20*  GLUCOSE 148*  BUN 41*  CREATININE 2.41*  CALCIUM 8.2*   Liver Function Tests: Recent Labs  Lab 01/04/20 1923  AST 30  ALT 37  ALKPHOS 75    BILITOT 0.3  PROT 7.2  ALBUMIN 3.2*   No results for input(s): LIPASE, AMYLASE in the last 168 hours. No results for input(s): AMMONIA in the last 168 hours. CBC: Recent Labs  Lab 01/04/20 1923  WBC 13.3*  NEUTROABS 9.9*  HGB 4.9*  HCT 17.6*  MCV 86.7  PLT 352   Cardiac Enzymes: No results for input(s): CKTOTAL, CKMB, CKMBINDEX, TROPONINI in the last 168 hours.  BNP (last 3 results) Recent Labs    04/20/19 0846 04/23/19 2037 08/29/19 1819  BNP 580.0* 793.0* 202.2*    ProBNP (last 3 results) No results for input(s): PROBNP in the last 8760 hours.  CBG: No results for input(s): GLUCAP in the last 168 hours.  Radiological Exams on Admission: DG Chest 2 View  Result Date: 01/04/2020 CLINICAL DATA:  Weakness.  Syncope. EXAM: CHEST - 2 VIEW COMPARISON:  September 16, 2019 FINDINGS: There is a dual chamber pacemaker/ICD in place. The lung volumes are low. The heart size is stable. There is mild vascular congestion. There is a small amount of fluid in the fissures. There is no pneumothorax. IMPRESSION: Mild vascular congestion and trace pleural fluid. Electronically Signed   By: Constance Holster M.D.   On: 01/04/2020 19:26    EKG: I independently viewed the EKG done and my findings are as followed: Paced rhythm with prolonged QTc   Assessment/Plan Present on Admission: . GI bleed . Hypoalbuminemia . Hyperglycemia . ICD (implantable cardioverter-defibrillator) in place . Essential hypertension . Hyperlipidemia  Active Problems:   Essential hypertension   Hyperlipidemia   Type 2 diabetes mellitus (HCC)   COPD (chronic obstructive pulmonary disease) (HCC)   Hyperglycemia   Hypoalbuminemia   Symptomatic anemia   ICD (implantable cardioverter-defibrillator) in place   GI bleed   Orthostatic hypotension   Acute blood loss anemia   Generalized weakness   Hyponatremia   Hyperkalemia   CAD (coronary artery disease)   HFrEF (heart failure with reduced ejection  fraction) (HCC)   Accidental fall   Prolonged QT interval   Atrial fibrillation, chronic (HCC)   Accidental fall due to generalized weakness and symptomatic anemia secondary to acute blood loss due to GI bleed  Stool occult blood was positive H/H= 4.9/17.6, this was 10.3/32.1 on 09/04/19 Type and crossmatch will be done 2 units of PRBC was ordered to be transfused in the ED with plan to give IV Lasix 20 Mg x1 in between the transfusion Continue IV Protonix drip Gastroenterologist will be consulted in the morning  Orthostatic hypotension in the setting  of above SBP on sitting 105/46; on standing, it was 73/51 IV hydration was provided Continue blood transfusion as described above and continue to vital signs Continue fall precaution and neurochecks  Hyperkalemia  K+ 6.1; EKG showed paced rhythm IV calcium gluconate was given for stabilization of the heart Lokelma was given Continue telemetry and continue to monitor K+ with morning labs  Hyponatremia Na 132, corrected sodium due to hyperglycemia (CBG 148)= 133 Continue to monitor sodium level with morning labs  Hyperglycemia secondary to type 2 diabetes mellitus CBG 148, last hemoglobin A1c done on 09/05/19 was 6.9 and patient was diagnosed with new onset type 2 diabetes mellitus, however, patient has no antidiabetic medication on review of home meds. Patient states that he was told to modify his diet when diagnosed in July Hemoglobin A1c will be repeated   Prolonged QTc QTc 578ms Avoid QT prolonging drugs Magnesium level will be checked Continue telemetry  Chronic atrial fibrillation Patient was on Eliquis, this will be held at this time due to GI bleed Amiodarone will be temporarily held due to prolonged QTc AICD in place  CAD Continue Lipitor Hold aspirin at this time due to GI bleed Nitroglycerin will be held at this time due to soft BP  HFrEF/essential hypertension Losartan, Coreg, spironolactone will be held at  this time due to soft BP Echocardiogram done on 09/07/2019 was shown below  Hypoalbuminemia Consider oral supplement when patient resumes oral intake  Hyperlipidemia Continue Lipitor   Echocardiogram done on 08/30/2019 IMPRESSIONS    1. Left ventricular ejection fraction, by estimation, is 20%. The left  ventricle has severely decreased function. The left ventricle demonstrates  global hypokinesis, more prominent inferior/posterior walls. There is  moderate left ventricular  hypertrophy. Left ventricular diastolic parameters are consistent with  Grade II diastolic dysfunction (pseudonormalization). Slow flow and stasis  noted by Definity contrast in LV particularly at apex. Although no formed  thrombus, there is significant  substrate for thrombus formation.  2. RV-RA gradient 25 mmHg. Right ventricular systolic function is  severely reduced. The right ventricular size is normal.  3. The mitral valve is grossly normal, mildly thickened. Mild mitral  valve regurgitation.  4. The aortic valve is tricuspid. Aortic valve regurgitation is not  visualized. Mild aortic valve sclerosis is present, with no evidence of  aortic valve stenosis.  5. Unable to estimate CVP.   DVT prophylaxis: SCDs (no indication for chemoprophylaxis at this time due to GI bleed)  Code Status: Full code  Family Communication: None at bedside  Disposition Plan:  Patient is from:                        home Anticipated DC to:                   home Anticipated DC date:               2-3 days Anticipated DC barriers:         Patient cannot be discharged at this time due to symptomatic GI bleed which required GI consult and recommendation   Consults called: Gastroenterology  Admission status: Inpatient    Bernadette Hoit MD [facility]  01/05/2020, 12:33 AM

## 2020-01-04 NOTE — ED Notes (Signed)
Pt. Did not stand for more than 2 sec. Pt. Was swaying and stated they felt dizzy. Pt. Sat back into the bed.

## 2020-01-04 NOTE — ED Notes (Signed)
Date and time results received: 01/04/20 8:21 PM  (use smartphrase ".now" to insert current time)  Test: Hemoglobin Critical Value: 4.9  Name of Provider Notified: Dr. Eulis Foster  Orders Received? Or Actions Taken?:N/a

## 2020-01-04 NOTE — ED Provider Notes (Signed)
[facility] EMERGENCY DEPARTMENT Provider Note   CSN: [id number] Arrival date & time: 01/04/20  1740     History Chief Complaint  Patient presents with  . Near Syncope    Peter Lambert is a 74 y.o. male.  HPI He presents complaining of generalized weakness for about 1 week, and today almost passed out so he called EMS.  He recently (7/21) had a V. fib cardiac arrest, was hospitalized and had a Oncologist placed.  He denies fever, chills, cough, shortness of breath, focal weakness or paresthesia.  He states he is taking his usual medications.  There are no other known modifying factors.    Past Medical History:  Diagnosis Date  . Coronary artery disease    a. s/p NSTEMI in 2010 with DES to proximal LAD with D1 jailed and angioplasty alone to ostium  . Hyperlipidemia   . Hypertension   . Hypertension   . Peripheral arterial disease ([place])   . Tobacco abuse   . Type 2 diabetes mellitus [facility])     Patient Active Problem List   Diagnosis Date Noted  . ICD (implantable cardioverter-defibrillator) in place 12/15/2019  . AKI (acute kidney injury) ([place]) 09/02/2019  . Hypokalemia 09/02/2019  . Hyperglycemia 09/02/2019  . Hypoalbuminemia 09/02/2019  . Elevated brain natriuretic peptide (BNP) level 09/02/2019  . Lactic acidosis 09/02/2019  . Leukocytosis 09/02/2019  . Proteinuria 09/02/2019  . Hypomagnesemia 09/02/2019  . NSTEMI (non-ST elevated myocardial infarction) ([place]) 09/02/2019  . CKD (chronic kidney disease), stage III b 09/02/2019  . PVC's (premature ventricular contractions) 09/02/2019  . NSVT (nonsustained ventricular tachycardia) ([place]) 09/02/2019  . Thrombocytopenia ([place]) 09/02/2019  . Anemia of infection and chronic disease 09/02/2019  . Severe sepsis with septic shock ([place]) suspected on admission, source: ? aspiration 09/02/2019  . Acute metabolic encephalopathy 38/11/1749  . Acute systolic CHF (congestive heart failure) ([place]) 09/02/2019  .  Anticoagulated on heparin 09/02/2019  . History of noncompliance with medical treatment 09/02/2019  . Cardiac arrest with ventricular fibrillation ([place])   . Acute on chronic respiratory failure with hypoxia ([place]) 04/23/2019  . Lobar pneumonia ([place]) 04/22/2019  . Aspiration pneumonia ([place]), bilateral   . Demand ischemia of myocardium ([place])   . Atrial flutter ([place])   . Acute on chronic diastolic heart failure ([place])   . Acute respiratory failure with hypoxemia ([place]) 07/02/2016  . COPD exacerbation ([place]) 07/02/2016  . Tobacco use 07/17/2013  . Peripheral arterial disease ([place]) 12/03/2012  . Coronary artery disease s/p DES to LAD in 2010, s/p PCI to proximal LAD stenosis with DES this hospitalization 12/03/2012  . Essential hypertension 12/03/2012  . Hyperlipidemia 12/03/2012  . New onset type 2 diabetes mellitus ([place]) 12/03/2012    Past Surgical History:  Procedure Laterality Date  . CARDIAC SURGERY    . CORONARY STENT INTERVENTION N/A 09/03/2019   Procedure: CORONARY STENT INTERVENTION;  Surgeon: Burnell Blanks, MD;  Location: [facility];  Service: Cardiovascular;  Laterality: N/A;  . CORONARY STENT PLACEMENT    . ICD IMPLANT N/A 09/04/2019   Procedure: ICD IMPLANT;  Surgeon: Evans Lance, MD;  Location: [facility];  Service: Cardiovascular;  Laterality: N/A;  . RIGHT/LEFT HEART CATH AND CORONARY ANGIOGRAPHY N/A 09/03/2019   Procedure: RIGHT/LEFT HEART CATH AND CORONARY ANGIOGRAPHY;  Surgeon: Jolaine Artist, MD;  Location: [facility];  Service: Cardiovascular;  Laterality: N/A;       Family History  Problem Relation Age of Onset  .  Cancer Mother     Social History   Tobacco Use  . Smoking status: Former Smoker    Packs/day: 0.50    Years: 52.00    Pack years: 26.00    Types: Cigarettes  . Smokeless tobacco: Never Used  Substance Use Topics  . Alcohol use: No  . Drug use: No    Home Medications Prior to Admission medications     Medication Sig Start Date End Date Taking? Authorizing Provider  acetaminophen (TYLENOL) 325 MG tablet Take 2 tablets (650 mg total) by mouth every 6 (six) hours as needed for mild pain, fever or headache (or Fever >/= 101). 04/28/19  Yes Emokpae, Courage, MD  albuterol (VENTOLIN HFA) 108 (90 Base) MCG/ACT inhaler Inhale 2 puffs into the lungs every 4 (four) hours as needed for wheezing or shortness of breath. 04/28/19  Yes Emokpae, Courage, MD  apixaban (ELIQUIS) 5 MG TABS tablet Take 1 tablet (5 mg total) by mouth 2 (two) times daily. 10/06/19 01/04/20 Yes Lorretta Harp, MD  aspirin EC 81 MG tablet Take 1 tablet (81 mg total) by mouth daily with breakfast. 04/28/19 04/27/20 Yes Emokpae, Courage, MD  atorvastatin (LIPITOR) 80 MG tablet Place 1 tablet (80 mg total) into feeding tube daily. 09/06/19 01/04/20 Yes Kyle, Tyrone A, DO  colchicine 0.6 MG tablet Take 0.6 mg by mouth daily.  05/23/18  Yes [provider]  guaiFENesin (MUCINEX) 600 MG 12 hr tablet Take 600 mg by mouth.   Yes [provider]  losartan (COZAAR) 25 MG tablet Take 1 tablet (25 mg total) by mouth daily. 09/06/19 01/04/20 Yes Kyle, Tyrone A, DO  nitroGLYCERIN (NITROSTAT) 0.4 MG SL tablet PLACE 1 TABLET UNDER THE TONGUE EVERY 5 MINUTES AS NEEDED FOR CHEST PAIN Patient taking differently: Place 0.4 mg under the tongue every 5 (five) minutes as needed. PLACE 1 TABLET UNDER THE TONGUE EVERY 5 MINUTES AS NEEDED FOR CHEST PAIN 11/26/19  Yes Lorretta Harp, MD  polyethylene glycol (MIRALAX / GLYCOLAX) 17 g packet Take 17 g by mouth daily. For bowel Patient taking differently: Take 17 g by mouth daily as needed for mild constipation.  04/28/19  Yes Roxan Hockey, MD  amiodarone (PACERONE) 200 MG tablet Take 2 tablets (400 mg total) by mouth 2 (two) times daily for 14 days, THEN 2 tablets (400 mg total) daily for 14 days, THEN 1 tablet (200 mg total) daily. Patient not taking: Reported on 01/04/2020 09/05/19 11/02/19  Cherylann Ratel A,  DO  carvedilol (COREG) 3.125 MG tablet Take 1 tablet (3.125 mg total) by mouth 2 (two) times daily with a meal. 09/05/19 10/05/19  Marylyn Ishihara, Tyrone A, DO  fluocinonide cream (LIDEX) 5.46 % Apply 1 application topically 2 (two) times daily. 12/23/19   [provider]  spironolactone (ALDACTONE) 50 MG tablet Take 1 tablet (50 mg total) by mouth daily. Patient not taking: Reported on 01/04/2020 09/06/19 10/06/19  Cherylann Ratel A, DO  potassium chloride (KLOR-CON) 20 MEQ packet TAKE 1 TABLET BY MOUTH EVERY DAY 12/24/12 07/14/13  Lorretta Harp, MD    Allergies    Patient has no known allergies.  Review of Systems   Review of Systems  All other systems reviewed and are negative.   Physical Exam Updated Vital Signs BP (!) 114/59   Pulse 61   Temp 97.6 F (36.4 C) (Oral)   Resp 17   Ht 5\' 8"  (1.727 m)   Wt 68.5 kg   SpO2 100%   BMI 22.96  kg/m   Physical Exam Vitals and nursing note reviewed.  Constitutional:      General: He is not in acute distress.    Appearance: He is well-developed. He is not ill-appearing, toxic-appearing or diaphoretic.  HENT:     Head: Normocephalic and atraumatic.     Right Ear: External ear normal.     Left Ear: External ear normal.     Mouth/Throat:     Mouth: Mucous membranes are moist.     Pharynx: No oropharyngeal exudate or posterior oropharyngeal erythema.  Eyes:     Conjunctiva/sclera: Conjunctivae normal.     Pupils: Pupils are equal, round, and reactive to light.  Neck:     Trachea: Phonation normal.  Cardiovascular:     Rate and Rhythm: Normal rate and regular rhythm.     Heart sounds: Normal heart sounds.  Pulmonary:     Effort: Pulmonary effort is normal.     Breath sounds: Normal breath sounds.  Abdominal:     General: There is no distension.     Palpations: Abdomen is soft.     Tenderness: There is no abdominal tenderness.  Genitourinary:    Comments: Normal anus.  Moderate amount of firm brown stool in rectal  vault. Musculoskeletal:        General: Normal range of motion.     Cervical back: Normal range of motion and neck supple.  Skin:    General: Skin is warm and dry.  Neurological:     Mental Status: He is alert and oriented to person, place, and time.     Cranial Nerves: No cranial nerve deficit.     Sensory: No sensory deficit.     Motor: No abnormal muscle tone.     Coordination: Coordination normal.  Psychiatric:        Mood and Affect: Mood normal.        Behavior: Behavior normal.        Thought Content: Thought content normal.        Judgment: Judgment normal.     ED Results / Procedures / Treatments   Labs (all labs ordered are listed, but only abnormal results are displayed) Labs Reviewed  COMPREHENSIVE METABOLIC PANEL - Abnormal; Notable for the following components:      Result Value   Sodium 132 (*)    Potassium 6.1 (*)    CO2 20 (*)    Glucose, Bld 148 (*)    BUN 41 (*)    Creatinine, Ser 2.41 (*)    Calcium 8.2 (*)    Albumin 3.2 (*)    GFR, Estimated 27 (*)    All other components within normal limits  CBC WITH DIFFERENTIAL/PLATELET - Abnormal; Notable for the following components:   WBC 13.3 (*)    RBC 2.03 (*)    Hemoglobin 4.9 (*)    HCT 17.6 (*)    MCH 24.1 (*)    MCHC 27.8 (*)    RDW 18.4 (*)    nRBC 1.0 (*)    Neutro Abs 9.9 (*)    Monocytes Absolute 2.0 (*)    Abs Immature Granulocytes 0.44 (*)    All other components within normal limits  POC OCCULT BLOOD, ED - Abnormal; Notable for the following components:   Fecal Occult Bld POSITIVE (*)    All other components within normal limits  RESPIRATORY PANEL BY RT PCR (FLU A&B, COVID)  URINALYSIS, ROUTINE W REFLEX MICROSCOPIC  TYPE AND SCREEN  PREPARE RBC (CROSSMATCH)  EKG EKG Interpretation  Date/Time:  Monday January 04 2020 17:58:48 EST Ventricular Rate:  60 PR Interval:    QRS Duration: 182 QT Interval:  549 QTC Calculation: 549 R Axis:   -75 Text Interpretation: Atrial  fibrillation Multiple ventricular premature complexes Nonspecific IVCD with LAD LVH with secondary repolarization abnormality Anterior infarct, old Since last tracing QRS widened, possible V pacing at 60 bpm Otherwise no significant change Serial tracing suggested Confirmed by Daleen Bo [phone]) on 01/04/2020 6:15:29 PM   Radiology DG Chest 2 View  Result Date: 01/04/2020 CLINICAL DATA:  Weakness.  Syncope. EXAM: CHEST - 2 VIEW COMPARISON:  September 16, 2019 FINDINGS: There is a dual chamber pacemaker/ICD in place. The lung volumes are low. The heart size is stable. There is mild vascular congestion. There is a small amount of fluid in the fissures. There is no pneumothorax. IMPRESSION: Mild vascular congestion and trace pleural fluid. Electronically Signed   By: Constance Holster M.D.   On: 01/04/2020 19:26    Procedures .Critical Care Performed by: Daleen Bo, MD Authorized by: Daleen Bo, MD   Critical care provider statement:    Critical care time (minutes):  55   Critical care start time:  01/04/2020 6:15 PM   Critical care end time:  01/04/2020 10:24 PM   Critical care time was exclusive of:  Separately billable procedures and treating other patients   Critical care was necessary to treat or prevent imminent or life-threatening deterioration of the following conditions:  Circulatory failure and renal failure   Critical care was time spent personally by me on the following activities:  Blood draw for specimens, development of treatment plan with patient or surrogate, discussions with consultants, evaluation of patient's response to treatment, examination of patient, obtaining history from patient or surrogate, ordering and performing treatments and interventions, ordering and review of laboratory studies, pulse oximetry, re-evaluation of patient's condition, review of old charts and ordering and review of radiographic studies   (including critical care time)  Medications Ordered in  ED Medications  0.9 %  sodium chloride infusion (has no administration in time range)  furosemide (LASIX) injection 20 mg (has no administration in time range)  sodium chloride 0.9 % bolus 1,000 mL (1,000 mLs Intravenous New Bag/Given 01/04/20 2218)  sodium zirconium cyclosilicate (LOKELMA) packet 5 g (5 g Oral Given 01/04/20 2219)    ED Course  I have reviewed the triage vital signs and the nursing notes.  Pertinent labs & imaging results that were available during my care of the patient were reviewed by me and considered in my medical decision making (see chart for details).  Clinical Course as of Jan 04 2252  Molli Knock Jan 04, 2020  2133 Patient son now in room and I discussed the findings with him.  He reports patient being weak, mostly on the left side for greater than 1 week.  Patient is agreeable to blood transfusion.   [EW]    Clinical Course User Index [EW] Daleen Bo, MD   MDM Rules/Calculators/A&P                           Patient Vitals for the past 24 hrs:  BP Temp Temp src Pulse Resp SpO2 Height Weight  01/04/20 2241 -- -- -- 61 -- 100 % -- --  01/04/20 2230 (!) 114/59 -- -- (!) 58 17 100 % -- --  01/04/20 2200 (!) 118/91 -- -- 61 16 99 % -- --  01/04/20 2130 (!) 103/48 -- -- 60 16 100 % -- --  01/04/20 2100 (!) 105/46 -- -- 62 16 100 % -- --  01/04/20 2030 (!) 108/42 -- -- 65 14 100 % -- --  01/04/20 2000 (!) 93/43 -- -- 64 (!) 22 100 % -- --  01/04/20 1945 -- -- -- 64 15 100 % -- --  01/04/20 1930 (!) 125/49 -- -- 62 16 100 % -- --  01/04/20 1915 -- -- -- 62 18 100 % -- --  01/04/20 1900 (!) 102/42 -- -- 61 15 100 % -- --  01/04/20 1845 -- -- -- 61 18 100 % -- --  01/04/20 1830 (!) 102/46 -- -- 65 16 96 % -- --  01/04/20 1815 -- -- -- -- 15 -- -- --  01/04/20 1800 (!) 111/44 -- -- -- 16 -- -- --  01/04/20 1753 (!) 111/44 97.6 F (36.4 C) Oral 63 16 95 % -- --  01/04/20 1751 -- -- -- -- -- -- 5\' 8"  (1.727 m) 68.5 kg    10:20 PM Reevaluation with update and  discussion. After initial assessment and treatment, an updated evaluation reveals he remains alert and fairly comfortable.  Findings discussed with patient all questions were answered. Daleen Bo   Medical Decision Making:  This patient is presenting for evaluation of generalized weakness with near syncope today, which does require a range of treatment options, and is a complaint that involves a high risk of morbidity and mortality. The differential diagnoses include recurrent ventricular fibrillation, cardiac arrhythmia, medical illness, infection, metabolic disorder. I decided to review old records, and in summary elderly male, presenting with gradual onset of weakness, and near syncope, today..  I obtained additional historical information from son at the bedside.  Clinical Laboratory Tests Ordered, included CBC, Metabolic panel and Fecal occult blood, Covid test, blood type. Review indicates increased white count, low hemoglobin, decreased RBC indices, decreased sodium, increase potassium, decreased CO2, increased glucose, increased BUN, increased creatinine, low calcium, low albumin, low GFR. Radiologic Tests Ordered, included chest x-ray.  I independently Visualized: Radiographic images, which show no infiltrate or edema  Cardiac Monitor Tracing which shows atrial fibrillation, rate controlled   Specimen of stool indicating normal color.   Critical Interventions-patient presenting with weakness, nonspecific, present for at least 1 week.  Patient with significant anemia, much lower than baseline, and guaiac positive brown stool in the rectum.  He is anticoagulated for atrial fibrillation.  No appearance of active bleeding in the ED.  Incidental elevated BUN/creatinine, without urinary bladder outlet obstruction present.  Bladder scan less than 100 cc.  Blood transfusion ordered and started.  Patient requires admission for further evaluation and treatment.  After These Interventions, the  Patient was reevaluated and was found to require admission for reasons stated above.  Critical care initiated in the emergency department for stabilization.  CRITICAL CARE-yes Performed by: Daleen Bo  Nursing Notes Reviewed/ Care Coordinated Applicable Imaging Reviewed Interpretation of Laboratory Data incorporated into ED treatment   10:22 PM-Consult complete with hospitalist. Patient case explained and discussed.  He agrees to admit patient for further evaluation and treatment. Call ended at 10:52 PM    Final Clinical Impression(s) / ED Diagnoses Final diagnoses:  Anemia, unspecified type  Rectal bleeding  Anticoagulated  Acute renal failure, unspecified acute renal failure type ([place])  Hyperkalemia    Rx / DC Orders ED Discharge Orders    None       Daleen Bo, MD  01/04/20 2253  

## 2020-01-05 ENCOUNTER — Encounter (HOSPITAL_COMMUNITY): Payer: Self-pay | Admitting: Internal Medicine

## 2020-01-05 DIAGNOSIS — W19XXXA Unspecified fall, initial encounter: Secondary | ICD-10-CM

## 2020-01-05 DIAGNOSIS — N179 Acute kidney failure, unspecified: Secondary | ICD-10-CM

## 2020-01-05 DIAGNOSIS — I482 Chronic atrial fibrillation, unspecified: Secondary | ICD-10-CM

## 2020-01-05 DIAGNOSIS — R9431 Abnormal electrocardiogram [ECG] [EKG]: Secondary | ICD-10-CM

## 2020-01-05 LAB — URINALYSIS, ROUTINE W REFLEX MICROSCOPIC
Bilirubin Urine: NEGATIVE
Glucose, UA: NEGATIVE mg/dL
Hgb urine dipstick: NEGATIVE
Ketones, ur: NEGATIVE mg/dL
Leukocytes,Ua: NEGATIVE
Nitrite: NEGATIVE
Protein, ur: 30 mg/dL — AB
Specific Gravity, Urine: 1.015 (ref 1.005–1.030)
pH: 5 (ref 5.0–8.0)

## 2020-01-05 LAB — CBC
HCT: 21.7 % — ABNORMAL LOW (ref 39.0–52.0)
Hemoglobin: 6.6 g/dL — CL (ref 13.0–17.0)
MCH: 26.6 pg (ref 26.0–34.0)
MCHC: 30.4 g/dL (ref 30.0–36.0)
MCV: 87.5 fL (ref 80.0–100.0)
Platelets: 288 10*3/uL (ref 150–400)
RBC: 2.48 MIL/uL — ABNORMAL LOW (ref 4.22–5.81)
RDW: 17.1 % — ABNORMAL HIGH (ref 11.5–15.5)
WBC: 9.9 10*3/uL (ref 4.0–10.5)
nRBC: 0.8 % — ABNORMAL HIGH (ref 0.0–0.2)

## 2020-01-05 LAB — IRON AND TIBC
Iron: 14 ug/dL — ABNORMAL LOW (ref 45–182)
Saturation Ratios: 4 % — ABNORMAL LOW (ref 17.9–39.5)
TIBC: 375 ug/dL (ref 250–450)
UIBC: 361 ug/dL

## 2020-01-05 LAB — BASIC METABOLIC PANEL
Anion gap: 11 (ref 5–15)
BUN: 30 mg/dL — ABNORMAL HIGH (ref 8–23)
CO2: 18 mmol/L — ABNORMAL LOW (ref 22–32)
Calcium: 8.3 mg/dL — ABNORMAL LOW (ref 8.9–10.3)
Chloride: 103 mmol/L (ref 98–111)
Creatinine, Ser: 1.7 mg/dL — ABNORMAL HIGH (ref 0.61–1.24)
GFR, Estimated: 41 mL/min — ABNORMAL LOW (ref >60)
Glucose, Bld: 167 mg/dL — ABNORMAL HIGH (ref 70–99)
Potassium: 4.8 mmol/L (ref 3.5–5.1)
Sodium: 132 mmol/L — ABNORMAL LOW (ref 135–145)

## 2020-01-05 LAB — FERRITIN: Ferritin: 27 ng/mL (ref 24–336)

## 2020-01-05 LAB — MAGNESIUM: Magnesium: 2.8 mg/dL — ABNORMAL HIGH (ref 1.7–2.4)

## 2020-01-05 LAB — COMPREHENSIVE METABOLIC PANEL WITH GFR
ALT: 31 U/L (ref 0–44)
AST: 22 U/L (ref 15–41)
Albumin: 3.1 g/dL — ABNORMAL LOW (ref 3.5–5.0)
Alkaline Phosphatase: 71 U/L (ref 38–126)
Anion gap: 10 (ref 5–15)
BUN: 43 mg/dL — ABNORMAL HIGH (ref 8–23)
CO2: 21 mmol/L — ABNORMAL LOW (ref 22–32)
Calcium: 8.3 mg/dL — ABNORMAL LOW (ref 8.9–10.3)
Chloride: 103 mmol/L (ref 98–111)
Creatinine, Ser: 2.1 mg/dL — ABNORMAL HIGH (ref 0.61–1.24)
GFR, Estimated: 32 mL/min — ABNORMAL LOW
Glucose, Bld: 116 mg/dL — ABNORMAL HIGH (ref 70–99)
Potassium: 5.7 mmol/L — ABNORMAL HIGH (ref 3.5–5.1)
Sodium: 134 mmol/L — ABNORMAL LOW (ref 135–145)
Total Bilirubin: 0.4 mg/dL (ref 0.3–1.2)
Total Protein: 6.9 g/dL (ref 6.5–8.1)

## 2020-01-05 LAB — APTT: aPTT: 44 seconds — ABNORMAL HIGH (ref 24–36)

## 2020-01-05 LAB — PREPARE RBC (CROSSMATCH)

## 2020-01-05 LAB — HEMOGLOBIN AND HEMATOCRIT, BLOOD
HCT: 26.2 % — ABNORMAL LOW (ref 39.0–52.0)
HCT: 27.9 % — ABNORMAL LOW (ref 39.0–52.0)
Hemoglobin: 8.2 g/dL — ABNORMAL LOW (ref 13.0–17.0)
Hemoglobin: 8.3 g/dL — ABNORMAL LOW (ref 13.0–17.0)

## 2020-01-05 LAB — PROTIME-INR
INR: 1.4 — ABNORMAL HIGH (ref 0.8–1.2)
Prothrombin Time: 16.9 s — ABNORMAL HIGH (ref 11.4–15.2)

## 2020-01-05 LAB — FOLATE: Folate: 5.1 ng/mL — ABNORMAL LOW (ref >5.9)

## 2020-01-05 LAB — VITAMIN B12: Vitamin B-12: 77 pg/mL — ABNORMAL LOW (ref 180–914)

## 2020-01-05 LAB — HEMOGLOBIN A1C
Hgb A1c MFr Bld: 5.4 % (ref 4.8–5.6)
Mean Plasma Glucose: 108.28 mg/dL

## 2020-01-05 LAB — PHOSPHORUS: Phosphorus: 5.3 mg/dL — ABNORMAL HIGH (ref 2.5–4.6)

## 2020-01-05 MED ORDER — AMIODARONE HCL 200 MG PO TABS
200.0000 mg | ORAL_TABLET | Freq: Every day | ORAL | Status: DC
  Administered 2020-01-05 – 2020-01-08 (×4): 200 mg via ORAL
  Filled 2020-01-05 (×5): qty 1

## 2020-01-05 MED ORDER — NITROGLYCERIN 0.4 MG SL SUBL: 0.4000 mg | SUBLINGUAL_TABLET | SUBLINGUAL | Status: DC | PRN

## 2020-01-05 MED ORDER — SODIUM CHLORIDE 0.9 % IV SOLN
80.0000 mg | Freq: Once | INTRAVENOUS | Status: AC
  Administered 2020-01-05: 80 mg via INTRAVENOUS
  Filled 2020-01-05: qty 80

## 2020-01-05 MED ORDER — ALBUTEROL SULFATE HFA 108 (90 BASE) MCG/ACT IN AERS: 2.0000 | INHALATION_SPRAY | RESPIRATORY_TRACT | Status: DC | PRN

## 2020-01-05 MED ORDER — IPRATROPIUM-ALBUTEROL 0.5-2.5 (3) MG/3ML IN SOLN
3.0000 mL | RESPIRATORY_TRACT | Status: DC | PRN
  Administered 2020-01-05 – 2020-01-06 (×3): 3 mL via RESPIRATORY_TRACT
  Filled 2020-01-05 (×3): qty 3

## 2020-01-05 MED ORDER — SODIUM CHLORIDE 0.9 % IV SOLN
8.0000 mg/h | INTRAVENOUS | Status: AC
  Administered 2020-01-05 – 2020-01-07 (×7): 8 mg/h via INTRAVENOUS
  Filled 2020-01-05 (×9): qty 80

## 2020-01-05 MED ORDER — PANTOPRAZOLE SODIUM 40 MG IV SOLR
40.0000 mg | Freq: Two times a day (BID) | INTRAVENOUS | Status: DC
  Administered 2020-01-08: 40 mg via INTRAVENOUS
  Filled 2020-01-05: qty 40

## 2020-01-05 MED ORDER — SODIUM ZIRCONIUM CYCLOSILICATE 10 G PO PACK
10.0000 g | PACK | Freq: Two times a day (BID) | ORAL | Status: DC
  Administered 2020-01-05 – 2020-01-06 (×3): 10 g via ORAL
  Filled 2020-01-05 (×2): qty 1
  Filled 2020-01-05: qty 2
  Filled 2020-01-05: qty 1

## 2020-01-05 MED ORDER — SODIUM CHLORIDE 0.9% IV SOLUTION: Freq: Once | INTRAVENOUS | Status: AC

## 2020-01-05 MED ORDER — GUAIFENESIN ER 600 MG PO TB12: 600.0000 mg | ORAL_TABLET | Freq: Two times a day (BID) | ORAL | Status: DC | PRN

## 2020-01-05 NOTE — H&P (View-Only) (Signed)
@LOGO @   Referring Provider: [facility] hospitalist Primary Care Physician:  Redmond School, MD Primary Gastroenterologist:  Dr. Jenetta Downer (previously unassigned)  Date of Admission: 01/04/2020 Date of Consultation: 01/05/2020  Reason for Consultation: Symptomatic anemia, heme positive stool  HPI:  Peter Lambert is a 74 y.o. year old male with medical history significant for COPD, CAD, HFrEF (LVEF 20% 08/30/2019), NSTEMI, VF arrest in July 2021 s/p ICD, A. fib on Eliquis, PAD, HTN, HLD, CKD, diabetes who presented to the emergency room due to 3 weeks of generalized weakness with imbalance and falls without hurting himself.  ED Course:  In the emergency department, SBP was in low 100s, orthostatic BP was positive from sitting to standing.  Work-up in the ED showed H/H 4.9/17.6 with MCV of 86.7, hyponatremia, hyperkalemia, BUN to creatinine 41/2.41 (baseline creatinine around 1.3-1.5), hyperglycemia, hyperkalemia, hypoalbuminemia. Stool occult blood was positive with moderate amount of firm brown stool on rectal vault. Chest x-ray showed mild vascular congestion and trace pleural fluid. IV hydration with NS was given, Lokelma x1 was given and IV calcium gluconate due to hyperkalemia was given.  Type and screen and 2 units of blood was ordered, IV lasix was ordered to be given between transfusions, and he was started on IV Protonix drip.    Today: Came to the emergency room due to weakness. Weakness has been present intermittently for a couple of weeks but worsened yesterday. Has been having close falls, but has been able to catch himself. No passing out. No bright red blood per rectum or black stools. Bowels tend to move every 2-3 days. Drinks prune juice or takes an ex-lax to help with BMs. MiraLAX doesn't work. Constipation is chronic. Reports having a colonoscopy here at [facility] many hears ago. Said they "clipped something off" but can't remember the results. Has been taking Tylenol, Mucinex, and  coricidin. Also takes baby aspirin, no NSAIDs. No alcohol, drug use, or tobacco use.   No nausea or vomiting. No abdominal pain. No GERD symptoms or dysphagia.   No chest pain or heart palpitations. Shortness of breath at baseline. Has O2 at home to use when needed but not on a routine basis. Some shortness of breath with exertion. Just started having a mild cough last night. Had a cough at home, but this had resolved with Mucinex and coricidin about Saturday or Sunday.   Last dose of Eliquis: Morning of 11/8.   Although he denied chest pain, he later reports last used nitroglycerine about 3 days ago. He was at work when the chest pain started. Work as a Librarian, academic at [employer]. No chest pain since then. Chronically using nitroglycerine as needed for angina.    Past Medical History:  Diagnosis Date  . Coronary artery disease    a. s/p NSTEMI in 2010 with DES to proximal LAD with D1 jailed and angioplasty alone to ostium  . Hyperlipidemia   . Hypertension   . Hypertension   . Peripheral arterial disease ([place])   . Tobacco abuse   . Type 2 diabetes mellitus ([place])     Past Surgical History:  Procedure Laterality Date  . CARDIAC SURGERY    . CORONARY STENT INTERVENTION N/A 09/03/2019   Procedure: CORONARY STENT INTERVENTION;  Surgeon: Burnell Blanks, MD;  Location: [place] [facility];  Service: Cardiovascular;  Laterality: N/A;  . CORONARY STENT PLACEMENT    . ICD IMPLANT N/A 09/04/2019   Procedure: ICD IMPLANT;  Surgeon: Evans Lance, MD;  Location: [place]  [facility];  Service: Cardiovascular;  Laterality: N/A;  . RIGHT/LEFT HEART CATH AND CORONARY ANGIOGRAPHY N/A 09/03/2019   Procedure: RIGHT/LEFT HEART CATH AND CORONARY ANGIOGRAPHY;  Surgeon: Jolaine Artist, MD;  Location: [place] [facility];  Service: Cardiovascular;  Laterality: N/A;    Prior to Admission medications   Medication Sig Start Date End Date Taking? Authorizing Provider  acetaminophen (TYLENOL) 325 MG tablet  Take 2 tablets (650 mg total) by mouth every 6 (six) hours as needed for mild pain, fever or headache (or Fever >/= 101). 04/28/19  Yes Emokpae, Courage, MD  albuterol (VENTOLIN HFA) 108 (90 Base) MCG/ACT inhaler Inhale 2 puffs into the lungs every 4 (four) hours as needed for wheezing or shortness of breath. 04/28/19  Yes Emokpae, Courage, MD  apixaban (ELIQUIS) 5 MG TABS tablet Take 1 tablet (5 mg total) by mouth 2 (two) times daily. 10/06/19 01/04/20 Yes Lorretta Harp, MD  aspirin EC 81 MG tablet Take 1 tablet (81 mg total) by mouth daily with breakfast. 04/28/19 04/27/20 Yes Emokpae, Courage, MD  atorvastatin (LIPITOR) 80 MG tablet Place 1 tablet (80 mg total) into feeding tube daily. 09/06/19 01/04/20 Yes Kyle, Tyrone A, DO  colchicine 0.6 MG tablet Take 0.6 mg by mouth daily.  05/23/18  Yes [provider]  guaiFENesin (MUCINEX) 600 MG 12 hr tablet Take 600 mg by mouth.   Yes [provider]  losartan (COZAAR) 25 MG tablet Take 1 tablet (25 mg total) by mouth daily. 09/06/19 01/04/20 Yes Kyle, Tyrone A, DO  nitroGLYCERIN (NITROSTAT) 0.4 MG SL tablet PLACE 1 TABLET UNDER THE TONGUE EVERY 5 MINUTES AS NEEDED FOR CHEST PAIN Patient taking differently: Place 0.4 mg under the tongue every 5 (five) minutes as needed. PLACE 1 TABLET UNDER THE TONGUE EVERY 5 MINUTES AS NEEDED FOR CHEST PAIN 11/26/19  Yes Lorretta Harp, MD  polyethylene glycol (MIRALAX / GLYCOLAX) 17 g packet Take 17 g by mouth daily. For bowel Patient taking differently: Take 17 g by mouth daily as needed for mild constipation.  04/28/19  Yes Roxan Hockey, MD  amiodarone (PACERONE) 200 MG tablet Take 2 tablets (400 mg total) by mouth 2 (two) times daily for 14 days, THEN 2 tablets (400 mg total) daily for 14 days, THEN 1 tablet (200 mg total) daily. Patient not taking: Reported on 01/04/2020 09/05/19 11/02/19  Cherylann Ratel A, DO  carvedilol (COREG) 3.125 MG tablet Take 1 tablet (3.125 mg total) by mouth 2 (two) times daily with a  meal. 09/05/19 10/05/19  Marylyn Ishihara, Tyrone A, DO  fluocinonide cream (LIDEX) 4.09 % Apply 1 application topically 2 (two) times daily. 12/23/19   [provider]  spironolactone (ALDACTONE) 50 MG tablet Take 1 tablet (50 mg total) by mouth daily. Patient not taking: Reported on 01/04/2020 09/06/19 10/06/19  Cherylann Ratel A, DO  potassium chloride (KLOR-CON) 20 MEQ packet TAKE 1 TABLET BY MOUTH EVERY DAY 12/24/12 07/14/13  Lorretta Harp, MD    Current Facility-Administered Medications  Medication Dose Route Frequency Provider Last Rate Last Admin  . furosemide (LASIX) injection 20 mg  20 mg Intravenous Once Adefeso, Oladapo, DO      . pantoprazole (PROTONIX) 80 mg in sodium chloride 0.9 % 100 mL (0.8 mg/mL) infusion  8 mg/hr Intravenous Continuous Adefeso, Oladapo, DO 10 mL/hr at 01/05/20 0151 8 mg/hr at 01/05/20 0151  . [START ON 01/08/2020] pantoprazole (PROTONIX) injection 40 mg  40 mg Intravenous Q12H Adefeso, Oladapo, DO  Current Outpatient Medications  Medication Sig Dispense Refill  . acetaminophen (TYLENOL) 325 MG tablet Take 2 tablets (650 mg total) by mouth every 6 (six) hours as needed for mild pain, fever or headache (or Fever >/= 101). 30 tablet 0  . albuterol (VENTOLIN HFA) 108 (90 Base) MCG/ACT inhaler Inhale 2 puffs into the lungs every 4 (four) hours as needed for wheezing or shortness of breath. 18 g 3  . apixaban (ELIQUIS) 5 MG TABS tablet Take 1 tablet (5 mg total) by mouth 2 (two) times daily. 60 tablet 6  . aspirin EC 81 MG tablet Take 1 tablet (81 mg total) by mouth daily with breakfast. 30 tablet 2  . atorvastatin (LIPITOR) 80 MG tablet Place 1 tablet (80 mg total) into feeding tube daily. 30 tablet 0  . colchicine 0.6 MG tablet Take 0.6 mg by mouth daily.     Marland Kitchen guaiFENesin (MUCINEX) 600 MG 12 hr tablet Take 600 mg by mouth.    . losartan (COZAAR) 25 MG tablet Take 1 tablet (25 mg total) by mouth daily. 30 tablet 0  . nitroGLYCERIN (NITROSTAT) 0.4 MG SL tablet PLACE  1 TABLET UNDER THE TONGUE EVERY 5 MINUTES AS NEEDED FOR CHEST PAIN (Patient taking differently: Place 0.4 mg under the tongue every 5 (five) minutes as needed. PLACE 1 TABLET UNDER THE TONGUE EVERY 5 MINUTES AS NEEDED FOR CHEST PAIN) 25 tablet 3  . polyethylene glycol (MIRALAX / GLYCOLAX) 17 g packet Take 17 g by mouth daily. For bowel (Patient taking differently: Take 17 g by mouth daily as needed for mild constipation. ) 30 each 5  . amiodarone (PACERONE) 200 MG tablet Take 2 tablets (400 mg total) by mouth 2 (two) times daily for 14 days, THEN 2 tablets (400 mg total) daily for 14 days, THEN 1 tablet (200 mg total) daily. (Patient not taking: Reported on 01/04/2020) 114 tablet 0  . carvedilol (COREG) 3.125 MG tablet Take 1 tablet (3.125 mg total) by mouth 2 (two) times daily with a meal. 60 tablet 0  . fluocinonide cream (LIDEX) 1.28 % Apply 1 application topically 2 (two) times daily.    Marland Kitchen spironolactone (ALDACTONE) 50 MG tablet Take 1 tablet (50 mg total) by mouth daily. (Patient not taking: Reported on 01/04/2020) 30 tablet 0    Allergies as of 01/04/2020  . (No Known Allergies)    Family History  Problem Relation Age of Onset  . Cancer Mother     Social History   Socioeconomic History  . Marital status: Divorced    Spouse name: Not on file  . Number of children: Not on file  . Years of education: Not on file  . Highest education level: Not on file  Occupational History  . Not on file  Tobacco Use  . Smoking status: Former Smoker    Packs/day: 0.50    Years: 52.00    Pack years: 26.00    Types: Cigarettes  . Smokeless tobacco: Never Used  Substance and Sexual Activity  . Alcohol use: No  . Drug use: No  . Sexual activity: Never  Other Topics Concern  . Not on file  Social History Narrative  . Not on file   Social Determinants of Health   Financial Resource Strain:   . Difficulty of Paying Living Expenses: Not on file  Food Insecurity:   . Worried About Ship broker in the Last Year: Not on file  . Ran Out of Food in the Last  Year: Not on file  Transportation Needs:   . Lack of Transportation (Medical): Not on file  . Lack of Transportation (Non-Medical): Not on file  Physical Activity:   . Days of Exercise per Week: Not on file  . Minutes of Exercise per Session: Not on file  Stress:   . Feeling of Stress : Not on file  Social Connections:   . Frequency of Communication with Friends and Family: Not on file  . Frequency of Social Gatherings with Friends and Family: Not on file  . Attends Religious Services: Not on file  . Active Member of Clubs or Organizations: Not on file  . Attends Archivist Meetings: Not on file  . Marital Status: Not on file  Intimate Partner Violence:   . Fear of Current or Ex-Partner: Not on file  . Emotionally Abused: Not on file  . Physically Abused: Not on file  . Sexually Abused: Not on file    Review of Systems: Gen: Denies fever, chills.  CV: See HPI Resp: See HPI GI: See HPI GU : No hematuria. MS: Denies joint pain Heme: See HPI  Physical Exam: Vital signs in last 24 hours: Temp:  [97.5 F (36.4 C)-98.2 F (36.8 C)] 98.2 F (36.8 C) (11/09 0650) Pulse Rate:  [56-65] 63 (11/09 0730) Resp:  [14-22] 18 (11/09 0730) BP: (93-138)/(42-91) 121/66 (11/09 0730) SpO2:  [95 %-100 %] 100 % (11/09 0730) Weight:  [68.5 kg] 68.5 kg (11/08 1751)   General:   Alert,  Well-developed, well-nourished, pleasant and cooperative in NAD. On 2L supplemental oxygen via nasal canula. Some mild audible wheezing intermittently.  Head:  Normocephalic and atraumatic. Eyes:  Sclera clear, no icterus.  Ears:  Normal auditory acuity. Lungs:  Clear throughout to auscultation. No wheezes, crackles, or rhonchi. No acute distress. Heart:  Regular rate and rhythm; no murmurs, clicks, rubs,  or gallops. Abdomen:  Protuberant abdomen but soft and non-tender. No masses, hepatosplenomegaly or hernias noted. Normal bowel  sounds, without guarding, and without rebound.   Rectal:  Deferred.   Msk:  Symmetrical without gross deformities. Normal posture.. Extremities:  Without edema. Neurologic:  Alert and  oriented x4;  grossly normal neurologically. Psych:  Normal mood and affect.  Intake/Output from previous day: 11/08 0701 - 11/09 0700 In: 2775 [I.V.:50; Blood:1575; IV Piggyback:1150] Out: 450 [Urine:450] Intake/Output this shift: No intake/output data recorded.  Lab Results: Recent Labs    01/04/20 1923 01/05/20 0427  WBC 13.3* 9.9  HGB 4.9* 6.6*  HCT 17.6* 21.7*  PLT 352 288   BMET Recent Labs    01/04/20 1923 01/05/20 0427  NA 132* 134*  K 6.1* 5.7*  CL 102 103  CO2 20* 21*  GLUCOSE 148* 116*  BUN 41* 43*  CREATININE 2.41* 2.10*  CALCIUM 8.2* 8.3*   LFT Recent Labs    01/04/20 1923 01/05/20 0427  PROT 7.2 6.9  ALBUMIN 3.2* 3.1*  AST 30 22  ALT 37 31  ALKPHOS 75 71  BILITOT 0.3 0.4   PT/INR Recent Labs    01/05/20 0427  LABPROT 16.9*  INR 1.4*   Studies/Results: DG Chest 2 View  Result Date: 01/04/2020 CLINICAL DATA:  Weakness.  Syncope. EXAM: CHEST - 2 VIEW COMPARISON:  September 16, 2019 FINDINGS: There is a dual chamber pacemaker/ICD in place. The lung volumes are low. The heart size is stable. There is mild vascular congestion. There is a small amount of fluid in the fissures. There is no pneumothorax. IMPRESSION: Mild  vascular congestion and trace pleural fluid. Electronically Signed   By: Constance Holster M.D.   On: 01/04/2020 19:26    Impression: 74 y.o. year old male with medical history significant forCOPD, CAD, HFrEF (LVEF 20% 08/30/2019), NSTEMI, VF arrest in July 2021 s/p ICD, A. fib on Eliquis, PAD, HTN, HLD, CKD, diabetes, and chronic anemia who presented to the emergency room due to 3 weeks of generalized weakness with imbalance and falls found to have hemoglobin 4.9 with heme positive stool.  BUN to creatinine 41/2.41 (baseline creatinine around 1.3-1.5).   Also with hyperkalemia, K 6.1. SBP was in low 100s, orthostatic BP was positive from sitting to standing. He was started on IV hydration with NS, Lokelma x1 and IV calcium gluconate due to hyperkalemia was given, 2 units PRBCs ordered, and patient was started on IV Protonix drip.  Acute on chronic anemia with heme positive stool: Profound anemia with hemoglobin 4.9 in the setting of Eliquis; this is down from baseline of 9-10 range. S/p 2 units PRBCs with hemoglobin up to 6.6 today. Patient denies BRBPR or melena. No significant upper GI symptoms. Has chronic history of mild constipation.  Reports colonoscopy many years ago at [facility] but I do not have records of this.  No prior EGD.  Denies NSAIDs other than daily baby aspirin.  No PPI.  No alcohol, drug use, or tobacco use.  No family history of colon cancer. Per chart review, it looks like patient has lost about 26 pounds over the last 7 months.   Etiology is broad with potential to bleed from essentially anywhere in the GI tract in the setting of Eliquis.  Differentials include gastritis, duodenitis, PUD, AVMs, gastric or colon polyps, and cannot rule out malignancy. He is going to need endoscopic evaluation. Will likely need EGD and colonoscopy. Due to significant cardiac history and multiple comorbidities, I suspect patient is likely too high risk for procedures here at [facility]. He will likely need transfer to [facility].  We will need to review this with anesthesia staff.   Additionally, patient is on Eliquis with last dose morning of 11/8. Earliest procedure would be 11/10.   Hyperkalemia: Management per hospitalist. K down to 5.7 this morning. This will also need to be corrected prior to procedures.   Plan: Continue to hold Eliquis Transfuse 1 additional unit PRBCs today Posttransfusion H/H Check Iron panel, ferritin, vitamin B12, folate Monitor for overt GI bleeding Clear liquids today Continue IV PPI infusion for now.  We will  discuss appropriateness of endoscopic evaluation locally with anesthesia staff.  Suspect patient will likely need to be transferred to [facility].   LOS: 1 day    01/05/2020, 8:02 AM   Aliene Altes, PA-C [facility]

## 2020-01-05 NOTE — Progress Notes (Signed)
PROGRESS NOTE    Peter Lambert  EQA:[id number] DOB: 30-May-1945 DOA: 01/04/2020 PCP: Redmond School, MD    Chief Complaint  Patient presents with  . Near Syncope    Brief Narrative:  74 year old gentleman prior history of coronary artery disease, COPD, cardiomyopathy with left ventricular ejection fraction of 20%, hypertension, hyperlipidemia, atrial fibrillation on Eliquis for anticoagulation, stage IIIa CKD presents to ED with 2 weeks of generalized weakness and gait instability.  Patient reports he has been having recurrent falls lately.  He was also found to have V. fib with cardiac arrest in July 2021 was hospitalized at [facility] underwent defibrillator placement.  On arrival to ED his blood pressure parameters were borderline he had orthostatic hypotension with a hemoglobin of 4.9 and hematocrit of 17.6, AKI with a creatinine of 2.7 ( Baseline creatinine around 1.3-1.5 ),.  His stool for occult blood was positive.  Chest x-ray showed mild vascular congestion.  He underwent 2 units of PRBC transfusion and was referred to hospitalist service for management of anemia.  GI consulted for further recommendations.  Assessment & Plan:   Active Problems:   Essential hypertension   Hyperlipidemia   Type 2 diabetes mellitus (HCC)   COPD (chronic obstructive pulmonary disease) (HCC)   Hyperglycemia   Hypoalbuminemia   Symptomatic anemia   ICD (implantable cardioverter-defibrillator) in place   GI bleed   Orthostatic hypotension   Acute blood loss anemia   Generalized weakness   Hyponatremia   Hyperkalemia   CAD (coronary artery disease)   HFrEF (heart failure with reduced ejection fraction) (HCC)   Accidental fall   Prolonged QT interval   Atrial fibrillation, chronic (HCC)   Generalized weakness probably secondary to symptomatic anemia/severe anemia.  Patient's hemoglobin on admission was 4.9 in the setting of anticoagulation.  Baseline hemoglobin between 9-10 2 units of PRBC  transfusion ordered and repeat hemoglobin ordered for tonight.  GI consulted and he will need further evaluation with an EGD and a colonoscopy. Continue to hold anticoagulation at this time in view of his severe anemia. Iron studies ordered by GI, fecal occult blood is positive.    AKI and hyperkalemia Probably secondary to GI blood loss. Avoid nephrotoxic agents/hypotension ACE and ARB. Gently hydrate and repeat renal parameters tonight and tomorrow.    Hyperkalemia Lokelma ordered with improvement of potassium from 6.1-5.7 this morning. Repeat potassium this evening. EKG ordered, Hold lisinopril.    Chronic systolic heart failure Patient had a history of V. fib arrest s/p ICD placement in July 2021 at [facility]. Patient appears to be compensated at this time.  Continue to monitor.    Chronic atrial fibrillation Rate controlled Anticoagulation on hold due to GI bleed.  Continue to monitor.   COPD Scattered wheezing anteriorly this morning. Duo nebs ordered and nasal cannula oxygen to keep sats greater than 90%.   Hyponatremia Probably secondary to dehydration improved with IV fluids.   Hypertension Blood pressure parameters are optimal at this time.   DVT prophylaxis: SCDs  code Status: Full code Family Communication: None at bedside Disposition:   Status is: Inpatient  Remains inpatient appropriate because:Ongoing diagnostic testing needed not appropriate for outpatient work up, IV treatments appropriate due to intensity of illness or inability to take PO and Inpatient level of care appropriate due to severity of illness   Dispo: The patient is from: Home              Anticipated d/c is to: Home  Anticipated d/c date is: 2 days              Patient currently is not medically stable to d/c.       Consultants:   Gastroenterology.    Procedures: none.    Antimicrobials: none.    Subjective: No chest pain  Reports some sob. On [place]  oxygen.   Objective: Vitals:   01/05/20 1311 01/05/20 1403 01/05/20 1414 01/05/20 1500  BP: 124/68 (!) 162/65  (!) 106/48  Pulse: 67 72  68  Resp: 18 (!) 24    Temp: 98.1 F (36.7 C) 98.3 F (36.8 C)    TempSrc: Oral Oral    SpO2: 100% 100% 98%   Weight:      Height:        Intake/Output Summary (Last 24 hours) at 01/05/2020 1530 Last data filed at 01/05/2020 1316 Gross per 24 hour  Intake 3135 ml  Output 450 ml  Net 2685 ml   Filed Weights   01/04/20 1751  Weight: 68.5 kg    Examination:  General exam: Appears calm and comfortable on 3 lit of [place] oxygen Respiratory system: Clear to auscultation. Respiratory effort normal. Cardiovascular system: S1 & S2 heard, irregularly irregular. No JVD,No pedal edema. Gastrointestinal system: Abdomen is nondistended, soft and nontender.  Normal bowel sounds heard. Central nervous system: Alert and oriented. No focal neurological deficits. Extremities: Symmetric 5 x 5 power. Skin: No rashes, lesions or ulcers Psychiatry: Mood & affect appropriate.     Data Reviewed: I have personally reviewed following labs and imaging studies  CBC: Recent Labs  Lab 01/04/20 1923 01/05/20 0427 01/05/20 1408  WBC 13.3* 9.9  --   NEUTROABS 9.9*  --   --   HGB 4.9* 6.6* 8.2*  HCT 17.6* 21.7* 26.2*  MCV 86.7 87.5  --   PLT 352 288  --     Basic Metabolic Panel: Recent Labs  Lab 01/04/20 1923 01/05/20 0427  NA 132* 134*  K 6.1* 5.7*  CL 102 103  CO2 20* 21*  GLUCOSE 148* 116*  BUN 41* 43*  CREATININE 2.41* 2.10*  CALCIUM 8.2* 8.3*  MG  --  2.8*  PHOS  --  5.3*    GFR: Estimated Creatinine Clearance: 29 mL/min (A) (by C-G formula based on SCr of 2.1 mg/dL (H)).  Liver Function Tests: Recent Labs  Lab 01/04/20 1923 01/05/20 0427  AST 30 22  ALT 37 31  ALKPHOS 75 71  BILITOT 0.3 0.4  PROT 7.2 6.9  ALBUMIN 3.2* 3.1*    CBG: No results for input(s): GLUCAP in the last 168 hours.   Recent Results (from the past 240  hour(s))  Respiratory Panel by RT PCR (Flu A&B, Covid) - Nasopharyngeal Swab     Status: None   Collection Time: 01/04/20  6:53 PM   Specimen: Nasopharyngeal Swab  Result Value Ref Range Status   SARS Coronavirus 2 by RT PCR NEGATIVE NEGATIVE Final    Comment: (NOTE) SARS-CoV-2 target nucleic acids are NOT DETECTED.  The SARS-CoV-2 RNA is generally detectable in upper respiratoy specimens during the acute phase of infection. The lowest concentration of SARS-CoV-2 viral copies this assay can detect is 131 copies/mL. A negative result does not preclude SARS-Cov-2 infection and should not be used as the sole basis for treatment or other patient management decisions. A negative result may occur with  improper specimen collection/handling, submission of specimen other than nasopharyngeal swab, presence of viral mutation(s) within the areas targeted  by this assay, and inadequate number of viral copies (<131 copies/mL). A negative result must be combined with clinical observations, patient history, and epidemiological information. The expected result is Negative.  Fact Sheet for Patients:  PinkCheek.be  Fact Sheet for Healthcare Providers:  GravelBags.it  This test is no t yet approved or cleared by the [place] FDA and  has been authorized for detection and/or diagnosis of SARS-CoV-2 by FDA under an Emergency Use Authorization (EUA). This EUA will remain  in effect (meaning this test can be used) for the duration of the COVID-19 declaration under Section 564(b)(1) of the Act, 21 U.S.C. section 360bbb-3(b)(1), unless the authorization is terminated or revoked sooner.     Influenza A by PCR NEGATIVE NEGATIVE Final   Influenza B by PCR NEGATIVE NEGATIVE Final    Comment: (NOTE) The Xpert Xpress SARS-CoV-2/FLU/RSV assay is intended as an aid in  the diagnosis of influenza from Nasopharyngeal swab specimens and  should not  be used as a sole basis for treatment. Nasal washings and  aspirates are unacceptable for Xpert Xpress SARS-CoV-2/FLU/RSV  testing.  Fact Sheet for Patients: PinkCheek.be  Fact Sheet for Healthcare Providers: GravelBags.it  This test is not yet approved or cleared by the [place] FDA and  has been authorized for detection and/or diagnosis of SARS-CoV-2 by  FDA under an Emergency Use Authorization (EUA). This EUA will remain  in effect (meaning this test can be used) for the duration of the  Covid-19 declaration under Section 564(b)(1) of the Act, 21  U.S.C. section 360bbb-3(b)(1), unless the authorization is  terminated or revoked. Performed at [facility], [address], [place], [place] [address]          Radiology Studies: DG Chest 2 View  Result Date: 01/04/2020 CLINICAL DATA:  Weakness.  Syncope. EXAM: CHEST - 2 VIEW COMPARISON:  September 16, 2019 FINDINGS: There is a dual chamber pacemaker/ICD in place. The lung volumes are low. The heart size is stable. There is mild vascular congestion. There is a small amount of fluid in the fissures. There is no pneumothorax. IMPRESSION: Mild vascular congestion and trace pleural fluid. Electronically Signed   By: Constance Holster M.D.   On: 01/04/2020 19:26        Scheduled Meds: . amiodarone  200 mg Oral Daily  . furosemide  20 mg Intravenous Once  . [START ON 01/08/2020] pantoprazole  40 mg Intravenous Q12H  . sodium zirconium cyclosilicate  10 g Oral BID   Continuous Infusions: . pantoprozole (PROTONIX) infusion 8 mg/hr (01/05/20 1128)     LOS: 1 day       Hosie Poisson, MD [facility]   To contact the attending provider between 7A-7P or the covering provider during after hours 7P-7A, please log into the web site [email or web address] and access using universal [place] password for that web site. If you do not have the password, please call the hospital  operator.  01/05/2020, 3:30 PM

## 2020-01-05 NOTE — TOC Initial Note (Signed)
Transition of Care [facility]) - Initial/Assessment Note    Patient Details  Name: Peter Lambert MRN: [id number] Date of Birth: [birth year]  Transition of Care [facility]) CM/SW Contact:    Peter Lambert, [place] Phone Number: 01/05/2020, 4:36 PM  Clinical Narrative:                 Pt admitted for GI bleed. Pt high risk for readmission. CSW spoke with pts son Peter Lambert. [phone] to complete assessment. Per pts son pt lives in the home with his son. Pt does drive and has been working at [employer]. Pt has been able to complete ADLs independently though pts son thinks pt may have some difficulty now. Pts son states that a nurse comes out once a month to check pts BP. Per chart review pt was set up with [facility] 08/2019. CSW reached out to [place] with [facility] to see if pt still active with their services. Per Peter Lambert pt has not been active with [facility] since 09/2019. Pt is on O2 at home though pts son unsure of what company it is supplied through. CSW spoke with pts son about possibility to PT evaluation and if family/pt would be agreeable to SNF if recommended. Pts son states that pt is stubborn and SNF has been recommended and pt not agreeable though family was and would be now agreeable. Family understanding that pt can make final decision. Pt son understanding and informed that [facility] could be set up if needed. CSW informed pts son that there is not an order for PT as of now so TOC will follow to see what needs will be at D/C. Pts son asked if CSW would call his brother to update him. CSW spoke with Peter Lambert [phone] to update of information shared with pts son Peter Lambert. TOC to follow.    Expected Discharge Plan: [place] Barriers to Discharge: Continued Medical Work up   Patient Goals and CMS Choice Patient states their goals for this hospitalization and ongoing recovery are:: Return home with [facility] or possibly go to SNF if PT recommends.   Choice  offered to / list presented to : NA  Expected Discharge Plan and Services Expected Discharge Plan: [place] In-house Referral: NA Discharge Planning Services: NA Post Acute Care Choice: [place] arrangements for the past 2 months: Single Family Home                 DME Arranged: N/A DME Agency: NA         HH Agency: [place] Date [place]: 01/05/20 Time HH Agency Contacted: [phone]    Prior Living Arrangements/Services Living arrangements for the past 2 months: Single Family Home Lives with:: Adult Children Patient language and need for interpreter reviewed:: Yes Do you feel safe going back to the place where you live?: Yes        Care giver support system in place?: Yes (comment) (Peter Lambert,Peter Lambert (Son) [phone])   Criminal Activity/Legal Involvement Pertinent to Current Situation/Hospitalization: No - Comment as needed  Activities of Daily Living Home Assistive Devices/Equipment: Dentures (specify type), Eyeglasses, Oxygen ADL Screening (condition at time of admission) Patient's cognitive ability adequate to safely complete daily activities?: Yes Is the patient deaf or have difficulty hearing?: No Does the patient have difficulty seeing, even when wearing glasses/contacts?: No Does the patient have difficulty concentrating, remembering, or making decisions?: No Patient able to express need  for assistance with ADLs?: Yes Does the patient have difficulty dressing or bathing?: No Independently performs ADLs?: Yes (appropriate for developmental age) Does the patient have difficulty walking or climbing stairs?: No Weakness of Legs: None Weakness of Arms/Hands: None  Permission Sought/Granted                  Emotional Assessment       Orientation: : Oriented to Self, Oriented to Place, Oriented to  Time, Oriented to Situation Alcohol / Substance Use: Not Applicable Psych Involvement: No  (comment)  Admission diagnosis:  Hyperkalemia [E87.5] Rectal bleeding [K62.5] GI bleed [K92.2] Anticoagulated [Z79.01] Acute renal failure, unspecified acute renal failure type ([place]) [N17.9] Anemia, unspecified type [D64.9] Patient Active Problem List   Diagnosis Date Noted  . Accidental fall 01/05/2020  . Prolonged QT interval 01/05/2020  . Atrial fibrillation, chronic ([place]) 01/05/2020  . GI bleed 01/04/2020  . Orthostatic hypotension 01/04/2020  . Acute blood loss anemia 01/04/2020  . Generalized weakness 01/04/2020  . Hyponatremia 01/04/2020  . Hyperkalemia 01/04/2020  . CAD (coronary artery disease) 01/04/2020  . HFrEF (heart failure with reduced ejection fraction) ([place]) 01/04/2020  . ICD (implantable cardioverter-defibrillator) in place 12/15/2019  . AKI (acute kidney injury) ([place]) 09/02/2019  . Hypokalemia 09/02/2019  . Hyperglycemia 09/02/2019  . Hypoalbuminemia 09/02/2019  . Elevated brain natriuretic peptide (BNP) level 09/02/2019  . Lactic acidosis 09/02/2019  . Leukocytosis 09/02/2019  . Proteinuria 09/02/2019  . Hypomagnesemia 09/02/2019  . NSTEMI (non-ST elevated myocardial infarction) ([place]) 09/02/2019  . CKD (chronic kidney disease), stage III b 09/02/2019  . PVC's (premature ventricular contractions) 09/02/2019  . NSVT (nonsustained ventricular tachycardia) ([place]) 09/02/2019  . Thrombocytopenia ([place]) 09/02/2019  . Symptomatic anemia 09/02/2019  . Severe sepsis with septic shock ([place]) suspected on admission, source: ? aspiration 09/02/2019  . Acute metabolic encephalopathy 13/24/4010  . Acute systolic CHF (congestive heart failure) ([place]) 09/02/2019  . Anticoagulated on heparin 09/02/2019  . History of noncompliance with medical treatment 09/02/2019  . Cardiac arrest with ventricular fibrillation ([place])   . Acute on chronic respiratory failure with hypoxia ([place]) 04/23/2019  . Lobar pneumonia ([place]) 04/22/2019  . Aspiration pneumonia ([place]), bilateral   . Demand  ischemia of myocardium ([place])   . Atrial flutter ([place])   . Acute on chronic diastolic heart failure ([place])   . Acute respiratory failure with hypoxemia ([place]) 07/02/2016  . COPD (chronic obstructive pulmonary disease) ([place]) 07/02/2016  . Tobacco use 07/17/2013  . Peripheral arterial disease ([place]) 12/03/2012  . Coronary artery disease s/p DES to LAD in 2010, s/p PCI to proximal LAD stenosis with DES this hospitalization 12/03/2012  . Essential hypertension 12/03/2012  . Hyperlipidemia 12/03/2012  . Type 2 diabetes mellitus ([place]) 12/03/2012   PCP:  Redmond School, MD Pharmacy:   [id number] - [place], [address] [place] [address] Phone: [phone] Fax: [phone]     Social Determinants of Health (SDOH) Interventions    Readmission Risk Interventions Readmission Risk Prevention Plan 04/28/2019 04/27/2019  Transportation Screening - Complete  Home Care Screening - Complete  Medication Review (RN CM) - Complete  HRI or Home Care Consult Complete -  Social Work Consult for [place] Planning/Counseling Complete -  Palliative Care Screening Not Applicable -  Medication Review Press photographer) Complete -  Some recent data might be hidden

## 2020-01-05 NOTE — Consult Note (Signed)
@LOGO @   Referring Provider: [facility] hospitalist Primary Care Physician:  Redmond School, MD Primary Gastroenterologist:  Dr. Jenetta Downer (previously unassigned)  Date of Admission: 01/04/2020 Date of Consultation: 01/05/2020  Reason for Consultation: Symptomatic anemia, heme positive stool  HPI:  Peter Lambert is a 74 y.o. year old male with medical history significant for COPD, CAD, HFrEF (LVEF 20% 08/30/2019), NSTEMI, VF arrest in July 2021 s/p ICD, A. fib on Eliquis, PAD, HTN, HLD, CKD, diabetes who presented to the emergency room due to 3 weeks of generalized weakness with imbalance and falls without hurting himself.  ED Course:  In the emergency department, SBP was in low 100s, orthostatic BP was positive from sitting to standing.  Work-up in the ED showed H/H 4.9/17.6 with MCV of 86.7, hyponatremia, hyperkalemia, BUN to creatinine 41/2.41 (baseline creatinine around 1.3-1.5), hyperglycemia, hyperkalemia, hypoalbuminemia. Stool occult blood was positive with moderate amount of firm brown stool on rectal vault. Chest x-ray showed mild vascular congestion and trace pleural fluid. IV hydration with NS was given, Lokelma x1 was given and IV calcium gluconate due to hyperkalemia was given.  Type and screen and 2 units of blood was ordered, IV lasix was ordered to be given between transfusions, and he was started on IV Protonix drip.    Today: Came to the emergency room due to weakness. Weakness has been present intermittently for a couple of weeks but worsened yesterday. Has been having close falls, but has been able to catch himself. No passing out. No bright red blood per rectum or black stools. Bowels tend to move every 2-3 days. Drinks prune juice or takes an ex-lax to help with BMs. MiraLAX doesn't work. Constipation is chronic. Reports having a colonoscopy here at [facility] many hears ago. Said they "clipped something off" but can't remember the results. Has been taking Tylenol, Mucinex, and  coricidin. Also takes baby aspirin, no NSAIDs. No alcohol, drug use, or tobacco use.   No nausea or vomiting. No abdominal pain. No GERD symptoms or dysphagia.   No chest pain or heart palpitations. Shortness of breath at baseline. Has O2 at home to use when needed but not on a routine basis. Some shortness of breath with exertion. Just started having a mild cough last night. Had a cough at home, but this had resolved with Mucinex and coricidin about Saturday or Sunday.   Last dose of Eliquis: Morning of 11/8.   Although he denied chest pain, he later reports last used nitroglycerine about 3 days ago. He was at work when the chest pain started. Work as a Librarian, academic at [employer]. No chest pain since then. Chronically using nitroglycerine as needed for angina.    Past Medical History:  Diagnosis Date  . Coronary artery disease    a. s/p NSTEMI in 2010 with DES to proximal LAD with D1 jailed and angioplasty alone to ostium  . Hyperlipidemia   . Hypertension   . Hypertension   . Peripheral arterial disease ([place])   . Tobacco abuse   . Type 2 diabetes mellitus ([place])     Past Surgical History:  Procedure Laterality Date  . CARDIAC SURGERY    . CORONARY STENT INTERVENTION N/A 09/03/2019   Procedure: CORONARY STENT INTERVENTION;  Surgeon: Burnell Blanks, MD;  Location: [place] [facility];  Service: Cardiovascular;  Laterality: N/A;  . CORONARY STENT PLACEMENT    . ICD IMPLANT N/A 09/04/2019   Procedure: ICD IMPLANT;  Surgeon: Evans Lance, MD;  Location: [place]  [facility];  Service: Cardiovascular;  Laterality: N/A;  . RIGHT/LEFT HEART CATH AND CORONARY ANGIOGRAPHY N/A 09/03/2019   Procedure: RIGHT/LEFT HEART CATH AND CORONARY ANGIOGRAPHY;  Surgeon: Jolaine Artist, MD;  Location: [place] [facility];  Service: Cardiovascular;  Laterality: N/A;    Prior to Admission medications   Medication Sig Start Date End Date Taking? Authorizing Provider  acetaminophen (TYLENOL) 325 MG tablet  Take 2 tablets (650 mg total) by mouth every 6 (six) hours as needed for mild pain, fever or headache (or Fever >/= 101). 04/28/19  Yes Emokpae, Courage, MD  albuterol (VENTOLIN HFA) 108 (90 Base) MCG/ACT inhaler Inhale 2 puffs into the lungs every 4 (four) hours as needed for wheezing or shortness of breath. 04/28/19  Yes Emokpae, Courage, MD  apixaban (ELIQUIS) 5 MG TABS tablet Take 1 tablet (5 mg total) by mouth 2 (two) times daily. 10/06/19 01/04/20 Yes Lorretta Harp, MD  aspirin EC 81 MG tablet Take 1 tablet (81 mg total) by mouth daily with breakfast. 04/28/19 04/27/20 Yes Emokpae, Courage, MD  atorvastatin (LIPITOR) 80 MG tablet Place 1 tablet (80 mg total) into feeding tube daily. 09/06/19 01/04/20 Yes Kyle, Tyrone A, DO  colchicine 0.6 MG tablet Take 0.6 mg by mouth daily.  05/23/18  Yes [provider]  guaiFENesin (MUCINEX) 600 MG 12 hr tablet Take 600 mg by mouth.   Yes [provider]  losartan (COZAAR) 25 MG tablet Take 1 tablet (25 mg total) by mouth daily. 09/06/19 01/04/20 Yes Kyle, Tyrone A, DO  nitroGLYCERIN (NITROSTAT) 0.4 MG SL tablet PLACE 1 TABLET UNDER THE TONGUE EVERY 5 MINUTES AS NEEDED FOR CHEST PAIN Patient taking differently: Place 0.4 mg under the tongue every 5 (five) minutes as needed. PLACE 1 TABLET UNDER THE TONGUE EVERY 5 MINUTES AS NEEDED FOR CHEST PAIN 11/26/19  Yes Lorretta Harp, MD  polyethylene glycol (MIRALAX / GLYCOLAX) 17 g packet Take 17 g by mouth daily. For bowel Patient taking differently: Take 17 g by mouth daily as needed for mild constipation.  04/28/19  Yes Roxan Hockey, MD  amiodarone (PACERONE) 200 MG tablet Take 2 tablets (400 mg total) by mouth 2 (two) times daily for 14 days, THEN 2 tablets (400 mg total) daily for 14 days, THEN 1 tablet (200 mg total) daily. Patient not taking: Reported on 01/04/2020 09/05/19 11/02/19  Cherylann Ratel A, DO  carvedilol (COREG) 3.125 MG tablet Take 1 tablet (3.125 mg total) by mouth 2 (two) times daily with a  meal. 09/05/19 10/05/19  Marylyn Ishihara, Tyrone A, DO  fluocinonide cream (LIDEX) 0.27 % Apply 1 application topically 2 (two) times daily. 12/23/19   [provider]  spironolactone (ALDACTONE) 50 MG tablet Take 1 tablet (50 mg total) by mouth daily. Patient not taking: Reported on 01/04/2020 09/06/19 10/06/19  Cherylann Ratel A, DO  potassium chloride (KLOR-CON) 20 MEQ packet TAKE 1 TABLET BY MOUTH EVERY DAY 12/24/12 07/14/13  Lorretta Harp, MD    Current Facility-Administered Medications  Medication Dose Route Frequency Provider Last Rate Last Admin  . furosemide (LASIX) injection 20 mg  20 mg Intravenous Once Adefeso, Oladapo, DO      . pantoprazole (PROTONIX) 80 mg in sodium chloride 0.9 % 100 mL (0.8 mg/mL) infusion  8 mg/hr Intravenous Continuous Adefeso, Oladapo, DO 10 mL/hr at 01/05/20 0151 8 mg/hr at 01/05/20 0151  . [START ON 01/08/2020] pantoprazole (PROTONIX) injection 40 mg  40 mg Intravenous Q12H Adefeso, Oladapo, DO  Current Outpatient Medications  Medication Sig Dispense Refill  . acetaminophen (TYLENOL) 325 MG tablet Take 2 tablets (650 mg total) by mouth every 6 (six) hours as needed for mild pain, fever or headache (or Fever >/= 101). 30 tablet 0  . albuterol (VENTOLIN HFA) 108 (90 Base) MCG/ACT inhaler Inhale 2 puffs into the lungs every 4 (four) hours as needed for wheezing or shortness of breath. 18 g 3  . apixaban (ELIQUIS) 5 MG TABS tablet Take 1 tablet (5 mg total) by mouth 2 (two) times daily. 60 tablet 6  . aspirin EC 81 MG tablet Take 1 tablet (81 mg total) by mouth daily with breakfast. 30 tablet 2  . atorvastatin (LIPITOR) 80 MG tablet Place 1 tablet (80 mg total) into feeding tube daily. 30 tablet 0  . colchicine 0.6 MG tablet Take 0.6 mg by mouth daily.     Marland Kitchen guaiFENesin (MUCINEX) 600 MG 12 hr tablet Take 600 mg by mouth.    . losartan (COZAAR) 25 MG tablet Take 1 tablet (25 mg total) by mouth daily. 30 tablet 0  . nitroGLYCERIN (NITROSTAT) 0.4 MG SL tablet PLACE  1 TABLET UNDER THE TONGUE EVERY 5 MINUTES AS NEEDED FOR CHEST PAIN (Patient taking differently: Place 0.4 mg under the tongue every 5 (five) minutes as needed. PLACE 1 TABLET UNDER THE TONGUE EVERY 5 MINUTES AS NEEDED FOR CHEST PAIN) 25 tablet 3  . polyethylene glycol (MIRALAX / GLYCOLAX) 17 g packet Take 17 g by mouth daily. For bowel (Patient taking differently: Take 17 g by mouth daily as needed for mild constipation. ) 30 each 5  . amiodarone (PACERONE) 200 MG tablet Take 2 tablets (400 mg total) by mouth 2 (two) times daily for 14 days, THEN 2 tablets (400 mg total) daily for 14 days, THEN 1 tablet (200 mg total) daily. (Patient not taking: Reported on 01/04/2020) 114 tablet 0  . carvedilol (COREG) 3.125 MG tablet Take 1 tablet (3.125 mg total) by mouth 2 (two) times daily with a meal. 60 tablet 0  . fluocinonide cream (LIDEX) 7.25 % Apply 1 application topically 2 (two) times daily.    Marland Kitchen spironolactone (ALDACTONE) 50 MG tablet Take 1 tablet (50 mg total) by mouth daily. (Patient not taking: Reported on 01/04/2020) 30 tablet 0    Allergies as of 01/04/2020  . (No Known Allergies)    Family History  Problem Relation Age of Onset  . Cancer Mother     Social History   Socioeconomic History  . Marital status: Divorced    Spouse name: Not on file  . Number of children: Not on file  . Years of education: Not on file  . Highest education level: Not on file  Occupational History  . Not on file  Tobacco Use  . Smoking status: Former Smoker    Packs/day: 0.50    Years: 52.00    Pack years: 26.00    Types: Cigarettes  . Smokeless tobacco: Never Used  Substance and Sexual Activity  . Alcohol use: No  . Drug use: No  . Sexual activity: Never  Other Topics Concern  . Not on file  Social History Narrative  . Not on file   Social Determinants of Health   Financial Resource Strain:   . Difficulty of Paying Living Expenses: Not on file  Food Insecurity:   . Worried About Ship broker in the Last Year: Not on file  . Ran Out of Food in the Last  Year: Not on file  Transportation Needs:   . Lack of Transportation (Medical): Not on file  . Lack of Transportation (Non-Medical): Not on file  Physical Activity:   . Days of Exercise per Week: Not on file  . Minutes of Exercise per Session: Not on file  Stress:   . Feeling of Stress : Not on file  Social Connections:   . Frequency of Communication with Friends and Family: Not on file  . Frequency of Social Gatherings with Friends and Family: Not on file  . Attends Religious Services: Not on file  . Active Member of Clubs or Organizations: Not on file  . Attends Archivist Meetings: Not on file  . Marital Status: Not on file  Intimate Partner Violence:   . Fear of Current or Ex-Partner: Not on file  . Emotionally Abused: Not on file  . Physically Abused: Not on file  . Sexually Abused: Not on file    Review of Systems: Gen: Denies fever, chills.  CV: See HPI Resp: See HPI GI: See HPI GU : No hematuria. MS: Denies joint pain Heme: See HPI  Physical Exam: Vital signs in last 24 hours: Temp:  [97.5 F (36.4 C)-98.2 F (36.8 C)] 98.2 F (36.8 C) (11/09 0650) Pulse Rate:  [56-65] 63 (11/09 0730) Resp:  [14-22] 18 (11/09 0730) BP: (93-138)/(42-91) 121/66 (11/09 0730) SpO2:  [95 %-100 %] 100 % (11/09 0730) Weight:  [68.5 kg] 68.5 kg (11/08 1751)   General:   Alert,  Well-developed, well-nourished, pleasant and cooperative in NAD. On 2L supplemental oxygen via nasal canula. Some mild audible wheezing intermittently.  Head:  Normocephalic and atraumatic. Eyes:  Sclera clear, no icterus.  Ears:  Normal auditory acuity. Lungs:  Clear throughout to auscultation. No wheezes, crackles, or rhonchi. No acute distress. Heart:  Regular rate and rhythm; no murmurs, clicks, rubs,  or gallops. Abdomen:  Protuberant abdomen but soft and non-tender. No masses, hepatosplenomegaly or hernias noted. Normal bowel  sounds, without guarding, and without rebound.   Rectal:  Deferred.   Msk:  Symmetrical without gross deformities. Normal posture.. Extremities:  Without edema. Neurologic:  Alert and  oriented x4;  grossly normal neurologically. Psych:  Normal mood and affect.  Intake/Output from previous day: 11/08 0701 - 11/09 0700 In: 2775 [I.V.:50; Blood:1575; IV Piggyback:1150] Out: 450 [Urine:450] Intake/Output this shift: No intake/output data recorded.  Lab Results: Recent Labs    01/04/20 1923 01/05/20 0427  WBC 13.3* 9.9  HGB 4.9* 6.6*  HCT 17.6* 21.7*  PLT 352 288   BMET Recent Labs    01/04/20 1923 01/05/20 0427  NA 132* 134*  K 6.1* 5.7*  CL 102 103  CO2 20* 21*  GLUCOSE 148* 116*  BUN 41* 43*  CREATININE 2.41* 2.10*  CALCIUM 8.2* 8.3*   LFT Recent Labs    01/04/20 1923 01/05/20 0427  PROT 7.2 6.9  ALBUMIN 3.2* 3.1*  AST 30 22  ALT 37 31  ALKPHOS 75 71  BILITOT 0.3 0.4   PT/INR Recent Labs    01/05/20 0427  LABPROT 16.9*  INR 1.4*   Studies/Results: DG Chest 2 View  Result Date: 01/04/2020 CLINICAL DATA:  Weakness.  Syncope. EXAM: CHEST - 2 VIEW COMPARISON:  September 16, 2019 FINDINGS: There is a dual chamber pacemaker/ICD in place. The lung volumes are low. The heart size is stable. There is mild vascular congestion. There is a small amount of fluid in the fissures. There is no pneumothorax. IMPRESSION: Mild  vascular congestion and trace pleural fluid. Electronically Signed   By: Constance Holster M.D.   On: 01/04/2020 19:26    Impression: 74 y.o. year old male with medical history significant forCOPD, CAD, HFrEF (LVEF 20% 08/30/2019), NSTEMI, VF arrest in July 2021 s/p ICD, A. fib on Eliquis, PAD, HTN, HLD, CKD, diabetes, and chronic anemia who presented to the emergency room due to 3 weeks of generalized weakness with imbalance and falls found to have hemoglobin 4.9 with heme positive stool.  BUN to creatinine 41/2.41 (baseline creatinine around 1.3-1.5).   Also with hyperkalemia, K 6.1. SBP was in low 100s, orthostatic BP was positive from sitting to standing. He was started on IV hydration with NS, Lokelma x1 and IV calcium gluconate due to hyperkalemia was given, 2 units PRBCs ordered, and patient was started on IV Protonix drip.  Acute on chronic anemia with heme positive stool: Profound anemia with hemoglobin 4.9 in the setting of Eliquis; this is down from baseline of 9-10 range. S/p 2 units PRBCs with hemoglobin up to 6.6 today. Patient denies BRBPR or melena. No significant upper GI symptoms. Has chronic history of mild constipation.  Reports colonoscopy many years ago at [facility] but I do not have records of this.  No prior EGD.  Denies NSAIDs other than daily baby aspirin.  No PPI.  No alcohol, drug use, or tobacco use.  No family history of colon cancer. Per chart review, it looks like patient has lost about 26 pounds over the last 7 months.   Etiology is broad with potential to bleed from essentially anywhere in the GI tract in the setting of Eliquis.  Differentials include gastritis, duodenitis, PUD, AVMs, gastric or colon polyps, and cannot rule out malignancy. He is going to need endoscopic evaluation. Will likely need EGD and colonoscopy. Due to significant cardiac history and multiple comorbidities, I suspect patient is likely too high risk for procedures here at [facility]. He will likely need transfer to [facility].  We will need to review this with anesthesia staff.   Additionally, patient is on Eliquis with last dose morning of 11/8. Earliest procedure would be 11/10.   Hyperkalemia: Management per hospitalist. K down to 5.7 this morning. This will also need to be corrected prior to procedures.   Plan: Continue to hold Eliquis Transfuse 1 additional unit PRBCs today Posttransfusion H/H Check Iron panel, ferritin, vitamin B12, folate Monitor for overt GI bleeding Clear liquids today Continue IV PPI infusion for now.  We will  discuss appropriateness of endoscopic evaluation locally with anesthesia staff.  Suspect patient will likely need to be transferred to [facility].   LOS: 1 day    01/05/2020, 8:02 AM   Aliene Altes, PA-C [facility]

## 2020-01-06 ENCOUNTER — Inpatient Hospital Stay (HOSPITAL_COMMUNITY): Payer: PPO | Admitting: Anesthesiology

## 2020-01-06 ENCOUNTER — Encounter (HOSPITAL_COMMUNITY)
Admission: EM | Disposition: A | Discharge status: Home Health | Payer: Self-pay | Source: Home / Self Care | Attending: Family Medicine

## 2020-01-06 DIAGNOSIS — D500 Iron deficiency anemia secondary to blood loss (chronic): Secondary | ICD-10-CM

## 2020-01-06 DIAGNOSIS — K31819 Angiodysplasia of stomach and duodenum without bleeding: Secondary | ICD-10-CM

## 2020-01-06 DIAGNOSIS — K222 Esophageal obstruction: Secondary | ICD-10-CM

## 2020-01-06 DIAGNOSIS — D509 Iron deficiency anemia, unspecified: Secondary | ICD-10-CM

## 2020-01-06 DIAGNOSIS — K2289 Other specified disease of esophagus: Secondary | ICD-10-CM

## 2020-01-06 DIAGNOSIS — K922 Gastrointestinal hemorrhage, unspecified: Secondary | ICD-10-CM

## 2020-01-06 HISTORY — PX: ESOPHAGOGASTRODUODENOSCOPY (EGD) WITH PROPOFOL: SHX5813

## 2020-01-06 LAB — BASIC METABOLIC PANEL
Anion gap: 10 (ref 5–15)
BUN: 24 mg/dL — ABNORMAL HIGH (ref 8–23)
CO2: 22 mmol/L (ref 22–32)
Calcium: 8.5 mg/dL — ABNORMAL LOW (ref 8.9–10.3)
Chloride: 101 mmol/L (ref 98–111)
Creatinine, Ser: 1.45 mg/dL — ABNORMAL HIGH (ref 0.61–1.24)
GFR, Estimated: 50 mL/min — ABNORMAL LOW (ref >60)
Glucose, Bld: 117 mg/dL — ABNORMAL HIGH (ref 70–99)
Potassium: 4.7 mmol/L (ref 3.5–5.1)
Sodium: 133 mmol/L — ABNORMAL LOW (ref 135–145)

## 2020-01-06 LAB — TYPE AND SCREEN
ABO/RH(D): B POS
Antibody Screen: NEGATIVE
Unit division: 0
Unit division: 0
Unit division: 0

## 2020-01-06 LAB — BPAM RBC
Blood Product Expiration Date: 202112102359
Blood Product Expiration Date: 202112112359
Blood Product Expiration Date: 202112152359
ISSUE DATE / TIME: 202111082255
ISSUE DATE / TIME: 202111090133
ISSUE DATE / TIME: 202111091013
Unit Type and Rh: 5100
Unit Type and Rh: 5100
Unit Type and Rh: 5100

## 2020-01-06 LAB — GLUCOSE, CAPILLARY: Glucose-Capillary: 116 mg/dL — ABNORMAL HIGH (ref 70–99)

## 2020-01-06 SURGERY — ESOPHAGOGASTRODUODENOSCOPY (EGD) WITH PROPOFOL
Anesthesia: General

## 2020-01-06 MED ORDER — LACTATED RINGERS IV SOLN: Freq: Once | INTRAVENOUS | Status: AC

## 2020-01-06 MED ORDER — LIDOCAINE VISCOUS HCL 2 % MT SOLN
OROMUCOSAL | Status: AC
  Filled 2020-01-06: qty 15

## 2020-01-06 MED ORDER — PROPOFOL 10 MG/ML IV BOLUS
INTRAVENOUS | Status: DC | PRN
  Administered 2020-01-06: 20 mg via INTRAVENOUS
  Administered 2020-01-06: 40 mg via INTRAVENOUS

## 2020-01-06 MED ORDER — PEG 3350-KCL-NA BICARB-NACL 420 G PO SOLR
4000.0000 mL | Freq: Once | ORAL | Status: AC
  Administered 2020-01-06: 4000 mL via ORAL

## 2020-01-06 MED ORDER — PHENYLEPHRINE 40 MCG/ML (10ML) SYRINGE FOR IV PUSH (FOR BLOOD PRESSURE SUPPORT)
PREFILLED_SYRINGE | INTRAVENOUS | Status: AC
  Filled 2020-01-06: qty 10

## 2020-01-06 MED ORDER — LIDOCAINE VISCOUS HCL 2 % MT SOLN
15.0000 mL | Freq: Once | OROMUCOSAL | Status: AC
  Administered 2020-01-06: 15 mL via OROMUCOSAL

## 2020-01-06 MED ORDER — ETOMIDATE 2 MG/ML IV SOLN
INTRAVENOUS | Status: AC
  Filled 2020-01-06: qty 10

## 2020-01-06 MED ORDER — STERILE WATER FOR IRRIGATION IR SOLN
Status: DC | PRN
  Administered 2020-01-06: 1.5 mL

## 2020-01-06 MED ORDER — LACTATED RINGERS IV SOLN: INTRAVENOUS | Status: DC | PRN

## 2020-01-06 MED ORDER — PHENYLEPHRINE HCL (PRESSORS) 10 MG/ML IV SOLN
INTRAVENOUS | Status: DC | PRN
  Administered 2020-01-06: 100 ug via INTRAVENOUS

## 2020-01-06 MED ORDER — ETOMIDATE 2 MG/ML IV SOLN
INTRAVENOUS | Status: DC | PRN
  Administered 2020-01-06: 2 mg via INTRAVENOUS
  Administered 2020-01-06: 6 mg via INTRAVENOUS

## 2020-01-06 NOTE — Plan of Care (Signed)

## 2020-01-06 NOTE — Progress Notes (Signed)
Brief esophagogastroduodenoscopy note.  Normal mucosa of the esophagus.  Irregular junction at 42 cm from the incisors. Paraesophageal hernia otherwise normal examination of the stomach. Single small nonbleeding AV malformation at bulb. Normal mucosa of second and third part of the duodenum. Patient tolerated the procedure well.

## 2020-01-06 NOTE — Anesthesia Preprocedure Evaluation (Signed)
Anesthesia Evaluation  Patient identified by MRN, date of birth, ID band Patient awake    Reviewed: Allergy & Precautions, NPO status , Patient's Chart, lab work & pertinent test results  History of Anesthesia Complications Negative for: history of anesthetic complications  Airway Mallampati: II  TM Distance: >3 FB Neck ROM: Full    Dental  (+) Edentulous Upper, Edentulous Lower   Pulmonary pneumonia (Aspiration?), COPD (uses oxygen as needed at home),  oxygen dependent, former smoker,           Cardiovascular Exercise Tolerance: Poor hypertension, Pt. on medications + CAD, + Past MI, + Cardiac Stents, + Peripheral Vascular Disease and +CHF (EF - 20%)  + Cardiac Defibrillator  Rhythm:Regular Rate:Normal  1. Left ventricular ejection fraction, by estimation, is 20%. The left  ventricle has severely decreased function. The left ventricle demonstrates  global hypokinesis, more prominent inferior/posterior walls. There is  moderate left ventricular  hypertrophy. Left ventricular diastolic parameters are consistent with  Grade II diastolic dysfunction (pseudonormalization). Slow flow and stasis  noted by Definity contrast in LV particularly at apex. Although no formed  thrombus, there is significant  substrate for thrombus formation.  2. RV-RA gradient 25 mmHg. Right ventricular systolic function is  severely reduced. The right ventricular size is normal.  3. The mitral valve is grossly normal, mildly thickened. Mild mitral  valve regurgitation.  4. The aortic valve is tricuspid. Aortic valve regurgitation is not  visualized. Mild aortic valve sclerosis is present, with no evidence of  aortic valve stenosis.  5. Unable to estimate CVP.   04-Jan-2020 18:40:57 [facility]-AP-ER ROUTINE RECORD A-V dual-paced rhythm with some inhibition No further analysis attempted due to paced rhythm Since last tracing pacer  spikes now visiible Otherwise no significant change Confirmed by Daleen Bo [phone]) on 01/04/2020 6:45:15 PM    Neuro/Psych negative psych ROS   GI/Hepatic negative GI ROS, Neg liver ROS,   Endo/Other  diabetes, Well Controlled  Renal/GU Renal InsufficiencyRenal disease     Musculoskeletal   Abdominal   Peds  Hematology  (+) anemia ,   Anesthesia Other Findings Peripheral arterial disease ([place]) Coronary artery disease s/p DES to LAD in 2010, s/p PCI to proximal LAD stenosis with DES this hospitalization Essential hypertension Hyperlipidemia Type 2 diabetes mellitus ([place]) Tobacco use Acute respiratory failure with hypoxemia (HCC) COPD (chronic obstructive pulmonary disease) ([place]) Aspiration pneumonia ([place]), bilateral Demand ischemia of myocardium (HCC) Atrial flutter (HCC) Acute on chronic diastolic heart failure (HCC) Lobar pneumonia (HCC) Acute on chronic respiratory failure with hypoxia (HCC) Cardiac arrest with ventricular fibrillation (HCC) AKI (acute kidney injury) ([place]) Hypokalemia Hyperglycemia Hypoalbuminemia Elevated brain natriuretic peptide (BNP) level Lactic acidosis Leukocytosis Proteinuria Hypomagnesemia NSTEMI (non-ST elevated myocardial infarction) ([place]) CKD (chronic kidney disease), stage III b PVC's (premature ventricular contractions) NSVT (nonsustained ventricular tachycardia) (HCC) Thrombocytopenia (HCC) Symptomatic anemia Severe sepsis with septic shock ([place]) suspected on admission, source: ? aspiration Acute metabolic encephalopathy Acute systolic CHF (congestive heart failure) (HCC) Anticoagulated on heparin History of noncompliance with medical treatment ICD (implantable cardioverter-defibrillator) in place GI bleed Orthostatic hypotension Acute blood loss anemia Generalized weakness Hyponatremia Hyperkalemia CAD (coronary artery disease) HFrEF (heart failure with reduced ejection fraction) (HCC) Accidental  fall Prolonged QT interval Atrial fibrillation, chronic (HCC)    Reproductive/Obstetrics                             Anesthesia Physical Anesthesia Plan  ASA: IV  Anesthesia Plan: General   Post-op Pain Management:    Induction: Intravenous  PONV Risk Score and Plan: TIVA  Airway Management Planned: Nasal Cannula and Natural Airway  Additional Equipment:   Intra-op Plan:   Post-operative Plan:   Informed Consent: I have reviewed the patients History and Physical, chart, labs and discussed the procedure including the risks, benefits and alternatives for the proposed anesthesia with the patient or authorized representative who has indicated his/her understanding and acceptance.     Dental advisory given  Plan Discussed with: CRNA and Surgeon  Anesthesia Plan Comments:         Anesthesia Quick Evaluation

## 2020-01-06 NOTE — Op Note (Addendum)
[facility] Patient Name: Peter Lambert Procedure Date: 01/06/2020 12:50 PM MRN: [id number] Date of Birth: Jul 18, 1945 Attending MD: Hildred Laser , MD CSN: [id number] Age: 74 Admit Type: Inpatient Procedure:                Upper GI endoscopy Indications:              Iron deficiency anemia secondary to chronic blood                            loss Providers:                Hildred Laser, MD, Charlsie Quest. Theda Sers RN, RN, Nelma Rothman, Technician, [employer], [place]                            Theda Sers RN, RN Referring MD:             Irwin Brakeman, MD Medicines:                Propofol per Anesthesia Complications:            No immediate complications. Estimated Blood Loss:     Estimated blood loss: none. Procedure:                Pre-Anesthesia Assessment:                           - Prior to the procedure, a History and Physical                            was performed, and patient medications and                            allergies were reviewed. [name] tolerance of                            previous anesthesia was also reviewed. The risks                            and benefits of the procedure and the sedation                            options and risks were discussed with the patient.                            All questions were answered, and informed consent                            was obtained. Prior Anticoagulants: The patient                            last took Eliquis (apixaban) 2 days prior to the  procedure. ASA Grade Assessment: IV - A patient                            with severe systemic disease that is a constant                            threat to life. After reviewing the risks and                            benefits, the patient was deemed in satisfactory                            condition to undergo the procedure.                           After obtaining informed consent, the  endoscope was                            passed under direct vision. Throughout the                            procedure, the patient's blood pressure, pulse, and                            oxygen saturations were monitored continuously. The                            [id number]-[id number] ([id number]) scope was introduced through the                            mouth, and advanced to the third part of duodenum. Scope In: 1:06:54 PM Scope Out: 1:13:17 PM Total Procedure Duration: 0 hours 6 minutes 23 seconds  Findings:      The hypopharynx was normal.      A moderate-sized area of extrinsic compression was found in the mid       esophagus.      The exam of the esophagus was otherwise normal.      The Z-line was irregular and was found 42 cm from the incisors.      A medium-sized paraesophageal hernia was found.      The exam of the stomach was otherwise normal.      A single small angioectasia without bleeding was found in the duodenal       bulb.      The second portion of the duodenum was normal. Impression:               - Normal hypopharynx.                           - Extrinsic compression in the mid esophagus.                           - Z-line irregular, 42 cm from the incisors.                           -  Medium-sized paraesophageal hernia.                           - A single non-bleeding angioectasia in the                            duodenum.                           - Normal second portion of the duodenum.                           - No specimens collected. Moderate Sedation:      Per Anesthesia Care Recommendation:           - Return patient to hospital ward for ongoing care.                           - Clear liquid diet today.                           - Continue present medications.                           - Perform a colonoscopy tomorrow.                           - UGIS as outpatient. Procedure Code(s):        --- Professional ---                           [phone],  Esophagogastroduodenoscopy, flexible,                            transoral; diagnostic, including collection of                            specimen(s) by brushing or washing, when performed                            (separate procedure) Diagnosis Code(s):        --- Professional ---                           K22.2, Esophageal obstruction                           K22.8, Other specified diseases of esophagus                           K44.9, Diaphragmatic hernia without obstruction or                            gangrene                           K31.819, Angiodysplasia of stomach and duodenum  without bleeding                           D50.0, Iron deficiency anemia secondary to blood                            loss (chronic) CPT copyright 2019 American Medical Association. All rights reserved. The codes documented in this report are preliminary and upon coder review may  be revised to meet current compliance requirements. Hildred Laser, MD Hildred Laser, MD 01/06/2020 1:26:13 PM This report has been signed electronically. Number of Addenda: 0

## 2020-01-06 NOTE — Progress Notes (Signed)
EGD findings reviewed with patient earlier today. He is agreeable to proceed with colonoscopy. Patient will be prepped with GoLYTELY today and undergo colonoscopy by Dr. Gala Romney tomorrow under monitored anesthesia care. Anticoagulation is on hold while looking for source of GI blood loss.

## 2020-01-06 NOTE — Transfer of Care (Signed)
Immediate Anesthesia Transfer of Care Note  Patient: Peter Lambert  Procedure(s) Performed: ESOPHAGOGASTRODUODENOSCOPY (EGD) WITH PROPOFOL (N/A )  Patient Location: PACU  Anesthesia Type:General  Level of Consciousness: awake, alert , oriented and patient cooperative  Airway & Oxygen Therapy: Patient Spontanous Breathing and Patient connected to nasal cannula oxygen  Post-op Assessment: Report given to RN, Post -op Vital signs reviewed and stable and Patient moving all extremities  Post vital signs: Reviewed and stable  Last Vitals:  Vitals Value Taken Time  BP    Temp    Pulse    Resp    SpO2      Last Pain:  Vitals:   01/06/20 1304  TempSrc:   PainSc: 0-No pain         Complications: No complications documented.

## 2020-01-06 NOTE — Anesthesia Postprocedure Evaluation (Signed)
Anesthesia Post Note  Patient: Peter Lambert  Procedure(s) Performed: ESOPHAGOGASTRODUODENOSCOPY (EGD) WITH PROPOFOL (N/A )  Patient location during evaluation: PACU Anesthesia Type: General Level of consciousness: awake, oriented, awake and alert and patient cooperative Pain management: pain level controlled Vital Signs Assessment: post-procedure vital signs reviewed and stable Respiratory status: spontaneous breathing, respiratory function stable, nonlabored ventilation and patient connected to nasal cannula oxygen Cardiovascular status: blood pressure returned to baseline and stable Postop Assessment: no headache and no backache Anesthetic complications: no   No complications documented.   Last Vitals:  Vitals:   01/06/20 0836 01/06/20 1226  BP:  (!) 151/78  Pulse:  79  Resp:  (!) 22  Temp:  37.6 C  SpO2: 98% 99%    Last Pain:  Vitals:   01/06/20 1304  TempSrc:   PainSc: 0-No pain                 Tacy Learn

## 2020-01-06 NOTE — Progress Notes (Signed)
PROGRESS NOTE    Peter Lambert  ZOX:[id number] DOB: 27-Jun-1945 DOA: 01/04/2020 PCP: Redmond School, MD    Chief Complaint  Patient presents with  . Near Syncope    Brief Narrative:  74 year old gentleman prior history of coronary artery disease, COPD, cardiomyopathy with left ventricular ejection fraction of 20%, hypertension, hyperlipidemia, atrial fibrillation on Eliquis for anticoagulation, stage IIIa CKD presents to ED with 2 weeks of generalized weakness and gait instability.  Patient reports he has been having recurrent falls lately.  He was also found to have V. fib with cardiac arrest in July 2021 was hospitalized at [facility] underwent defibrillator placement.  On arrival to ED his blood pressure parameters were borderline he had orthostatic hypotension with a hemoglobin of 4.9 and hematocrit of 17.6, AKI with a creatinine of 2.7 ( Baseline creatinine around 1.3-1.5 ),.  His stool for occult blood was positive.  Chest x-ray showed mild vascular congestion.  He underwent 2 units of PRBC transfusion and was referred to hospitalist service for management of anemia.  GI consulted for further recommendations.  Assessment & Plan:   Active Problems:   Essential hypertension   Hyperlipidemia   Type 2 diabetes mellitus (HCC)   COPD (chronic obstructive pulmonary disease) (HCC)   Hyperglycemia   Hypoalbuminemia   Symptomatic anemia   ICD (implantable cardioverter-defibrillator) in place   GI bleed   Orthostatic hypotension   Acute blood loss anemia   Generalized weakness   Hyponatremia   Hyperkalemia   CAD (coronary artery disease)   HFrEF (heart failure with reduced ejection fraction) (HCC)   Accidental fall   Prolonged QT interval   Atrial fibrillation, chronic (HCC)   Generalized weakness probably secondary to symptomatic anemia/severe anemia.  Patient's hemoglobin on admission was 4.9 in the setting of anticoagulation.  Baseline hemoglobin between 9-10 2 units of PRBC  transfusion ordered and repeat hemoglobin ordered for tonight.  GI consulted and he will need further evaluation with an EGD and a colonoscopy. Continue to hold anticoagulation at this time in view of his severe anemia. Iron studies ordered by GI, fecal occult blood is positive. GI planning for colonoscopy 11/11.    AKI and hyperkalemia Probably secondary to GI blood loss. Avoid nephrotoxic agents/hypotension ACE and ARB. Improving with hydration.   Hyperkalemia Lokelma ordered with improvement of potassium from 6.1-5.7 this morning. Repeat potassium this evening. EKG ordered, Hold lisinopril.  Chronic systolic heart failure Patient had a history of V. fib arrest s/p ICD placement in July 2021 at [facility]. Patient appears to be compensated at this time.  Continue to monitor.  Chronic atrial fibrillation Rate controlled Anticoagulation on hold due to GI bleed.  Continue to monitor.  COPD Scattered wheezing anteriorly this morning. DuoNebs ordered and nasal cannula oxygen to keep sats greater than 90%.  Hyponatremia Probably secondary to dehydration improved with IV fluids.  Hypertension Blood pressure is controlled.   DVT prophylaxis: SCDs  Code Status: Full code Family Communication: None at bedside Disposition:  Home when medically cleared.   Status is: Inpatient  Remains inpatient appropriate because:Ongoing diagnostic testing needed not appropriate for outpatient work up, IV treatments appropriate due to intensity of illness or inability to take PO and Inpatient level of care appropriate due to severity of illness.  No source of bleeding found on EGD, GI planning inpatient colonoscopy 11/11 per Dr. Laural Golden.    Dispo: The patient is from: Home  Anticipated d/c is to: Home              Anticipated d/c date is: 2 days              Patient currently is not medically stable to d/c.   Consultants:   Gastroenterology.    Procedures: none.     Antimicrobials: none.    Subjective: No chest pain  Reports some sob. On Ali Molina oxygen.   Objective: Vitals:   01/06/20 1226 01/06/20 1317 01/06/20 1330 01/06/20 1345  BP: (!) 151/78 (!) 111/49 (!) 113/51 (!) 117/59  Pulse: 79 71    Resp: (!) 22 18 (!) 24 18  Temp: 99.6 F (37.6 C) 98.2 F (36.8 C)    TempSrc: Oral     SpO2: 99% 100% 98% 100%  Weight:      Height:        Intake/Output Summary (Last 24 hours) at 01/06/2020 1509 Last data filed at 01/06/2020 1406 Gross per 24 hour  Intake 562.29 ml  Output 1350 ml  Net -787.71 ml   Filed Weights   01/04/20 1751  Weight: 68.5 kg    Examination:  General exam: Appears calm and comfortable on 3LNC oxygen, wheezing more today.  Respiratory system: diffuse expiratory wheezing heard bilateral. Respiratory effort normal. Cardiovascular system: normal S1 & S2 heard, irregularly irregular. No JVD,No pedal edema. Gastrointestinal system: Abdomen is nondistended, soft and nontender.  Normal bowel sounds heard. Central nervous system: Alert and oriented. No focal neurological deficits. Extremities: Symmetric 5 x 5 power. Skin: No rashes, lesions or ulcers Psychiatry: Mood & affect appropriate.   Data Reviewed: I have personally reviewed following labs and imaging studies  CBC: Recent Labs  Lab 01/04/20 1923 01/05/20 0427 01/05/20 1408 01/05/20 1943  WBC 13.3* 9.9  --   --   NEUTROABS 9.9*  --   --   --   HGB 4.9* 6.6* 8.2* 8.3*  HCT 17.6* 21.7* 26.2* 27.9*  MCV 86.7 87.5  --   --   PLT 352 288  --   --     Basic Metabolic Panel: Recent Labs  Lab 01/04/20 1923 01/05/20 0427 01/05/20 1943 01/06/20 0503  NA 132* 134* 132* 133*  K 6.1* 5.7* 4.8 4.7  CL 102 103 103 101  CO2 20* 21* 18* 22  GLUCOSE 148* 116* 167* 117*  BUN 41* 43* 30* 24*  CREATININE 2.41* 2.10* 1.70* 1.45*  CALCIUM 8.2* 8.3* 8.3* 8.5*  MG  --  2.8*  --   --   PHOS  --  5.3*  --   --     GFR: Estimated Creatinine Clearance: 41.9 mL/min  (A) (by C-G formula based on SCr of 1.45 mg/dL (H)).  Liver Function Tests: Recent Labs  Lab 01/04/20 1923 01/05/20 0427  AST 30 22  ALT 37 31  ALKPHOS 75 71  BILITOT 0.3 0.4  PROT 7.2 6.9  ALBUMIN 3.2* 3.1*    CBG: Recent Labs  Lab 01/06/20 1229  GLUCAP 116*     Recent Results (from the past 240 hour(s))  Respiratory Panel by RT PCR (Flu A&B, Covid) - Nasopharyngeal Swab     Status: None   Collection Time: 01/04/20  6:53 PM   Specimen: Nasopharyngeal Swab  Result Value Ref Range Status   SARS Coronavirus 2 by RT PCR NEGATIVE NEGATIVE Final    Comment: (NOTE) SARS-CoV-2 target nucleic acids are NOT DETECTED.  The SARS-CoV-2 RNA is generally detectable in upper respiratoy specimens during  the acute phase of infection. The lowest concentration of SARS-CoV-2 viral copies this assay can detect is 131 copies/mL. A negative result does not preclude SARS-Cov-2 infection and should not be used as the sole basis for treatment or other patient management decisions. A negative result may occur with  improper specimen collection/handling, submission of specimen other than nasopharyngeal swab, presence of viral mutation(s) within the areas targeted by this assay, and inadequate number of viral copies (<131 copies/mL). A negative result must be combined with clinical observations, patient history, and epidemiological information. The expected result is Negative.  Fact Sheet for Patients:  PinkCheek.be  Fact Sheet for Healthcare Providers:  GravelBags.it  This test is no t yet approved or cleared by the [place] FDA and  has been authorized for detection and/or diagnosis of SARS-CoV-2 by FDA under an Emergency Use Authorization (EUA). This EUA will remain  in effect (meaning this test can be used) for the duration of the COVID-19 declaration under Section 564(b)(1) of the Act, 21 U.S.C. section 360bbb-3(b)(1),  unless the authorization is terminated or revoked sooner.     Influenza A by PCR NEGATIVE NEGATIVE Final   Influenza B by PCR NEGATIVE NEGATIVE Final    Comment: (NOTE) The Xpert Xpress SARS-CoV-2/FLU/RSV assay is intended as an aid in  the diagnosis of influenza from Nasopharyngeal swab specimens and  should not be used as a sole basis for treatment. Nasal washings and  aspirates are unacceptable for Xpert Xpress SARS-CoV-2/FLU/RSV  testing.  Fact Sheet for Patients: PinkCheek.be  Fact Sheet for Healthcare Providers: GravelBags.it  This test is not yet approved or cleared by the [place] FDA and  has been authorized for detection and/or diagnosis of SARS-CoV-2 by  FDA under an Emergency Use Authorization (EUA). This EUA will remain  in effect (meaning this test can be used) for the duration of the  Covid-19 declaration under Section 564(b)(1) of the Act, 21  U.S.C. section 360bbb-3(b)(1), unless the authorization is  terminated or revoked. Performed at [facility], [address], [place], [place] [address]     Radiology Studies: DG Chest 2 View  Result Date: 01/04/2020 CLINICAL DATA:  Weakness.  Syncope. EXAM: CHEST - 2 VIEW COMPARISON:  September 16, 2019 FINDINGS: There is a dual chamber pacemaker/ICD in place. The lung volumes are low. The heart size is stable. There is mild vascular congestion. There is a small amount of fluid in the fissures. There is no pneumothorax. IMPRESSION: Mild vascular congestion and trace pleural fluid. Electronically Signed   By: Constance Holster M.D.   On: 01/04/2020 19:26   Scheduled Meds: . amiodarone  200 mg Oral Daily  . furosemide  20 mg Intravenous Once  . [START ON 01/08/2020] pantoprazole  40 mg Intravenous Q12H  . sodium zirconium cyclosilicate  10 g Oral BID   Continuous Infusions: . pantoprozole (PROTONIX) infusion 8 mg/hr (01/06/20 1415)     LOS: 2 days   Irwin Brakeman, MD How to contact the [facility] Attending or Consulting provider [facility] or covering provider during after hours [place], for this patient?  1. Check the care team in [facility] and look for a) attending/consulting TR[facility] provider listed and b) the [facility] team listed 2. Log into [email or web address] and use 's universal password to access. If you do not have the password, please contact the hospital operator. 3. Locate the [facility] provider you are looking for under [facility] and page to a number that you can be directly  reached. 4. If you still have difficulty reaching the provider, please page the [facility] (Director on Call) for the Hospitalists listed on amion for assistance.   To contact the attending provider between 7A-7P or the covering provider during after hours 7P-7A, please log into the web site [email or web address] and access using universal [place] password for that web site. If you do not have the password, please call the hospital operator.  01/06/2020, 3:09 PM

## 2020-01-06 NOTE — Progress Notes (Signed)
GI Inpatient Follow-up Note  Subjective: Seen today & reports he is feeling fairly well. Weakness improved after transfusion.  He denies any abdominal pain chronically.  Denies nausea vomiting GERD or dysphagia.  States he is typically constipated and has a bowel movement about once a week.  Denies any blood in stool or dark stools.  He reports last oral intake was broth last night, currently NPO for procedure.  He denies tobacco or alcohol intake.  Only NSAID is aspirin 81 mg daily.  He reports colonoscopy approximately 10 years ago. No prior upper endoscopy. Reports weight loss over past few months.    Scheduled Inpatient Medications:  . amiodarone  200 mg Oral Daily  . furosemide  20 mg Intravenous Once  . [START ON 01/08/2020] pantoprazole  40 mg Intravenous Q12H  . sodium zirconium cyclosilicate  10 g Oral BID    Continuous Inpatient Infusions:   . pantoprozole (PROTONIX) infusion 8 mg/hr (01/05/20 2337)    PRN Inpatient Medications:  albuterol, guaiFENesin, ipratropium-albuterol, nitroGLYCERIN  Review of Systems: Constitutional: Weight is stable.  Eyes: No changes in vision. ENT: No oral lesions, sore throat.  GI: see HPI.  Heme/Lymph: No easy bruising.  CV: No chest pain.  GU: No hematuria.  Integumentary: No rashes.  Neuro: No headaches.  Psych: No depression/anxiety.  Endocrine: No heat/cold intolerance.  Allergic/Immunologic: No urticaria.  Resp: No cough, SOB.  Musculoskeletal: No joint swelling.    Physical Examination: BP (!) 156/82 (BP Location: Right Arm)   Pulse 77   Temp 99.7 F (37.6 C) (Oral)   Resp 19   Ht 5\' 8"  (1.727 m)   Wt 68.5 kg   SpO2 98%   BMI 22.96 kg/m  Gen: NAD, alert and oriented x 4 HEENT: PEERLA, EOMI, Neck: supple, no JVD or thyromegaly Chest: CTA bilaterally, no wheezes, crackles, or other adventitious sounds CV: RRR, no m/g/c/r Abd: soft, NT, ND, +BS in all four quadrants; no HSM, guarding, ridigity, or rebound tenderness.  Large abd scar noted. Ext: no edema, well perfused with 2+ pulses, Skin: no rash or lesions noted Lymph: no LAD  Data: Lab Results  Component Value Date   WBC 9.9 01/05/2020   HGB 8.3 (L) 01/05/2020   HCT 27.9 (L) 01/05/2020   MCV 87.5 01/05/2020   PLT 288 01/05/2020   Recent Labs  Lab 01/05/20 0427 01/05/20 1408 01/05/20 1943  HGB 6.6* 8.2* 8.3*   Lab Results  Component Value Date   NA 133 (L) 01/06/2020   K 4.7 01/06/2020   CL 101 01/06/2020   CO2 22 01/06/2020   BUN 24 (H) 01/06/2020   CREATININE 1.45 (H) 01/06/2020   Lab Results  Component Value Date   ALT 31 01/05/2020   AST 22 01/05/2020   ALKPHOS 71 01/05/2020   BILITOT 0.4 01/05/2020   Recent Labs  Lab 01/05/20 0427  APTT 44*  INR 1.4*   Assessment/Plan: Mr. Rueb is a 74 y.o. male with past medical history of COPD, CAD, CHF (LVEF 20% 08/30/2019), NSTEMI, VF arrest in July 2021 s/p ICD,A. fib on Eliquis, PAD, HTN, HLD, CKD, diabetes admitted for evaluation of worsening weakness and fatigue over 3 weeks.  Found to have severe anemia on admission.  1.  Severe anemia-positive Hemoccult, transfused 3 units and hemoglobin up to 8.3 yesterday evening (was 4.9 on admission).  He is currently on Eliquis with his last dose 01/04/2020.  Plan is for endoscopy today.  Will order morning hemoglobin.  Has combination of  low B12 at 77 and low iron at 14.  Plan on IV PPI. Per patient last colonoscopy 10+ years ago and unclear if ever had EGD in past. Last dose of Eliquis 01/04/20. If EGD negative will need colonoscopy for follow up.   Recommendations: 1. NPO until EGD 2. Check AM CBC prior to EGD today 3. Continue PPI BID  4. B12 replacement at discharge  Case discussed w/ Dr Laural Golden Please call with questions or concerns.    Ronney Asters, PA-C [facility]

## 2020-01-07 ENCOUNTER — Encounter (HOSPITAL_COMMUNITY)
Admission: EM | Disposition: A | Discharge status: Home Health | Payer: Self-pay | Source: Home / Self Care | Attending: Family Medicine

## 2020-01-07 DIAGNOSIS — K922 Gastrointestinal hemorrhage, unspecified: Secondary | ICD-10-CM | POA: Diagnosis not present

## 2020-01-07 DIAGNOSIS — K449 Diaphragmatic hernia without obstruction or gangrene: Secondary | ICD-10-CM

## 2020-01-07 DIAGNOSIS — D649 Anemia, unspecified: Secondary | ICD-10-CM

## 2020-01-07 DIAGNOSIS — Z7901 Long term (current) use of anticoagulants: Secondary | ICD-10-CM

## 2020-01-07 LAB — CBC
HCT: 27.7 % — ABNORMAL LOW (ref 39.0–52.0)
Hemoglobin: 8.4 g/dL — ABNORMAL LOW (ref 13.0–17.0)
MCH: 26.1 pg (ref 26.0–34.0)
MCHC: 30.3 g/dL (ref 30.0–36.0)
MCV: 86 fL (ref 80.0–100.0)
Platelets: 350 10*3/uL (ref 150–400)
RBC: 3.22 MIL/uL — ABNORMAL LOW (ref 4.22–5.81)
RDW: 17 % — ABNORMAL HIGH (ref 11.5–15.5)
WBC: 10.6 10*3/uL — ABNORMAL HIGH (ref 4.0–10.5)
nRBC: 0.2 % (ref 0.0–0.2)

## 2020-01-07 LAB — BASIC METABOLIC PANEL
Anion gap: 9 (ref 5–15)
BUN: 18 mg/dL (ref 8–23)
CO2: 24 mmol/L (ref 22–32)
Calcium: 8.3 mg/dL — ABNORMAL LOW (ref 8.9–10.3)
Chloride: 98 mmol/L (ref 98–111)
Creatinine, Ser: 1.32 mg/dL — ABNORMAL HIGH (ref 0.61–1.24)
GFR, Estimated: 56 mL/min — ABNORMAL LOW (ref >60)
Glucose, Bld: 100 mg/dL — ABNORMAL HIGH (ref 70–99)
Potassium: 4.6 mmol/L (ref 3.5–5.1)
Sodium: 131 mmol/L — ABNORMAL LOW (ref 135–145)

## 2020-01-07 LAB — MAGNESIUM: Magnesium: 2.1 mg/dL (ref 1.7–2.4)

## 2020-01-07 SURGERY — COLONOSCOPY WITH PROPOFOL
Anesthesia: Monitor Anesthesia Care

## 2020-01-07 MED ORDER — BISACODYL 5 MG PO TBEC
15.0000 mg | DELAYED_RELEASE_TABLET | ORAL | Status: AC
  Administered 2020-01-07: 15 mg via ORAL
  Filled 2020-01-07: qty 3

## 2020-01-07 MED ORDER — POLYETHYLENE GLYCOL 3350 17 G PO PACK
17.0000 g | PACK | ORAL | Status: AC
  Administered 2020-01-07 (×5): 17 g via ORAL
  Filled 2020-01-07 (×4): qty 1

## 2020-01-07 MED ORDER — COLCHICINE 0.6 MG PO TABS
1.2000 mg | ORAL_TABLET | Freq: Once | ORAL | Status: AC
  Administered 2020-01-07: 1.2 mg via ORAL
  Filled 2020-01-07: qty 2

## 2020-01-07 MED ORDER — POLYETHYLENE GLYCOL 3350 17 G PO PACK
17.0000 g | PACK | ORAL | Status: AC
  Administered 2020-01-07 (×3): 17 g via ORAL
  Filled 2020-01-07 (×3): qty 1

## 2020-01-07 MED ORDER — VITAMIN B-12 1000 MCG PO TABS
1000.0000 ug | ORAL_TABLET | Freq: Every day | ORAL | Status: DC
  Administered 2020-01-08: 1000 ug via ORAL
  Filled 2020-01-07: qty 1

## 2020-01-07 MED ORDER — CYANOCOBALAMIN 1000 MCG/ML IJ SOLN
1000.0000 ug | Freq: Once | INTRAMUSCULAR | Status: AC
  Administered 2020-01-07: 1000 ug via INTRAMUSCULAR
  Filled 2020-01-07: qty 1

## 2020-01-07 MED ORDER — LINACLOTIDE 145 MCG PO CAPS
290.0000 ug | ORAL_CAPSULE | Freq: Every day | ORAL | Status: DC
  Administered 2020-01-07: 290 ug via ORAL
  Filled 2020-01-07: qty 2

## 2020-01-07 MED ORDER — SODIUM CHLORIDE 0.9 % IV SOLN: INTRAVENOUS | Status: DC

## 2020-01-07 MED ORDER — COLCHICINE 0.6 MG PO TABS
0.6000 mg | ORAL_TABLET | Freq: Every day | ORAL | Status: DC
  Administered 2020-01-08: 0.6 mg via ORAL
  Filled 2020-01-07: qty 1

## 2020-01-07 NOTE — Progress Notes (Signed)
Subjective:  Patient on the schedule for colonoscopy today but has not completed bowel prep.  He reports that he drank 2 glasses.  Denies having a bowel movement until after enema this morning.  Denies abdominal pain.  States he cannot hold a lot of fluid which is why he did not drink the prep.  Wants to hurry up and get his procedure done so he can go home.  Objective: Vital signs in last 24 hours: Temp:  [98.2 F (36.8 C)-100.1 F (37.8 C)] 99.5 F (37.5 C) (11/11 0500) Pulse Rate:  [71-79] 75 (11/11 0500) Resp:  [16-24] 17 (11/10 2150) BP: (111-151)/(49-78) 127/69 (11/11 0500) SpO2:  [93 %-100 %] 100 % (11/11 0500) Last BM Date: 01/03/20 General:   Alert,  Well-developed, well-nourished, pleasant and cooperative in NAD Head:  Normocephalic and atraumatic. Eyes:  Sclera clear, no icterus.  Abdomen:  Soft, nontender and nondistended. No masses, hepatosplenomegaly or hernias noted. Normal bowel sounds, without guarding, and without rebound.   Psych:  Alert and cooperative. Normal mood and affect.  Intake/Output from previous day: 11/10 0701 - 11/11 0700 In: 355.2 [P.O.:200; I.V.:155.2] Out: 1750 [Urine:1750] Intake/Output this shift: No intake/output data recorded.  Lab Results: CBC Recent Labs    01/04/20 1923 01/04/20 1923 01/05/20 0427 01/05/20 0427 01/05/20 1408 01/05/20 1943 01/07/20 0440  WBC 13.3*  --  9.9  --   --   --  10.6*  HGB 4.9*   < > 6.6*   < > 8.2* 8.3* 8.4*  HCT 17.6*   < > 21.7*   < > 26.2* 27.9* 27.7*  MCV 86.7  --  87.5  --   --   --  86.0  PLT 352  --  288  --   --   --  350   < > = values in this interval not displayed.   BMET Recent Labs    01/05/20 1943 01/06/20 0503 01/07/20 0440  NA 132* 133* 131*  K 4.8 4.7 4.6  CL 103 101 98  CO2 18* 22 24  GLUCOSE 167* 117* 100*  BUN 30* 24* 18  CREATININE 1.70* 1.45* 1.32*  CALCIUM 8.3* 8.5* 8.3*   LFTs Recent Labs    01/04/20 1923 01/05/20 0427  BILITOT 0.3 0.4  ALKPHOS 75 71  AST 30  22  ALT 37 31  PROT 7.2 6.9  ALBUMIN 3.2* 3.1*   No results for input(s): LIPASE in the last 72 hours. PT/INR Recent Labs    01/05/20 0427  LABPROT 16.9*  INR 1.4*      Imaging Studies: DG Chest 2 View  Result Date: 01/04/2020 CLINICAL DATA:  Weakness.  Syncope. EXAM: CHEST - 2 VIEW COMPARISON:  September 16, 2019 FINDINGS: There is a dual chamber pacemaker/ICD in place. The lung volumes are low. The heart size is stable. There is mild vascular congestion. There is a small amount of fluid in the fissures. There is no pneumothorax. IMPRESSION: Mild vascular congestion and trace pleural fluid. Electronically Signed   By: Constance Holster M.D.   On: 01/04/2020 19:26   CUP PACEART INCLINIC DEVICE CHECK  Result Date: 12/15/2019 Pacemaker check in clinic. Normal device function. Thresholds, sensing, impedances consistent with previous measurements. Device programmed to maximize longevity. No mode switch or high ventricular rates noted. Device programmed at appropriate safety margins. Histogram distribution appropriate for patient activity level. Device programmed to optimize intrinsic conduction. Outputs programmed to chronic settings with 2:1 safety margin, RA- 2.0V RV- 2.2V. Estimated longevity 13  years. Patient enrolled in remote follow-up 03/07/2020. Patient education completed.Lavenia Atlas, BSN, RN [phone] weeks]   Assessment:  74 y/o male with COPD, CAD, CHF (LVEF 20% 08/2019), NSTEMI, VF arrest 08/2019 s/p ICD, Afib on Eliquis, PAD, CKD, DM, HTN who presented with worsening weakness and fatigue over several weeks.  Acute on chronic anemia with heme positive stool: Profound anemia with hemoglobin of 4.9 in the setting of Eliquis, down from baseline 9-10 range.  Mixed anemia with low B12 of 77 and low iron of 14. Has received 3 units of packed red blood cells with hemoglobin now in the 8-1/2 range.  No overt GI bleeding noted.  Denies any NSAIDs other than daily baby aspirin.  No PPI.  Last dose  of Eliquis November 8.  EGD this admission with extrinsic compression (?related to large atrium) in the mid esophagus, medium sized paraesophageal hernia, single nonbleeding angiectasia of the duodenum.  Colonoscopy this admission pending adequate bowel prep.  This morning patient had only consumed 2 glasses of GoLYTELY.  After tapwater enema, stools are brown and liquidy but not clear.  Additional prep provided with Dulcolax and MiraLAX, avoiding mag citrate due to renal insufficiency.  B12 deficiency with B12 level of 77.  Plan: 1. Continue bowel prep with hopes for colonoscopy today. 2. Management of low B12 level per attending. 3. Upper GI series at a later date to evaluate paraesophageal hernia.  Laureen Ochs. Bernarda Caffey [facility] [phone] 11/11/20219:59 AM     LOS: 3 days

## 2020-01-07 NOTE — Progress Notes (Signed)
PROGRESS NOTE    Peter Lambert  QMV:[id number] DOB: 10/16/1945 DOA: 01/04/2020 PCP: Redmond School, MD    Chief Complaint  Patient presents with  . Near Syncope    Brief Narrative:  74 year old gentleman prior history of coronary artery disease, COPD, cardiomyopathy with left ventricular ejection fraction of 20%, hypertension, hyperlipidemia, atrial fibrillation on Eliquis for anticoagulation, stage IIIa CKD presents to ED with 2 weeks of generalized weakness and gait instability.  Patient reports he has been having recurrent falls lately.  He was also found to have V. fib with cardiac arrest in July 2021 was hospitalized at [facility] underwent defibrillator placement.  On arrival to ED his blood pressure parameters were borderline he had orthostatic hypotension with a hemoglobin of 4.9 and hematocrit of 17.6, AKI with a creatinine of 2.7 ( Baseline creatinine around 1.3-1.5 ),.  His stool for occult blood was positive.  Chest x-ray showed mild vascular congestion.  He underwent 2 units of PRBC transfusion and was referred to hospitalist service for management of anemia.  GI consulted for further recommendations.  Assessment & Plan:   Active Problems:   Essential hypertension   Hyperlipidemia   Type 2 diabetes mellitus (HCC)   COPD (chronic obstructive pulmonary disease) (HCC)   Hyperglycemia   Hypoalbuminemia   Anemia   ICD (implantable cardioverter-defibrillator) in place   GI bleed   Orthostatic hypotension   Acute blood loss anemia   Generalized weakness   Hyponatremia   Hyperkalemia   CAD (coronary artery disease)   HFrEF (heart failure with reduced ejection fraction) (HCC)   Accidental fall   Prolonged QT interval   Atrial fibrillation, chronic (HCC)   Paraesophageal hernia   Generalized weakness probably secondary to symptomatic anemia/severe anemia.  Patient's hemoglobin on admission was 4.9 in the setting of anticoagulation.  Baseline hemoglobin between 9-10 2  units of PRBC transfusion ordered and repeat hemoglobin ordered for tonight.  GI consulted and he will need further evaluation with an EGD and a colonoscopy. Continue to hold anticoagulation at this time in view of his severe anemia. Iron studies ordered by GI, fecal occult blood is positive. GI planning for colonoscopy 11/11, due to poor bowel prep, colonoscopy rescheduled for 11/12.    AKI and hyperkalemia Probably secondary to GI blood loss. Avoid nephrotoxic agents/hypotension ACE and ARB. Improving with hydration.   Hyperkalemia Lokelma ordered with improvement of potassium from 6.1-5.7 this morning. Repeat potassium this evening. EKG ordered, Hold lisinopril.  Acute Gout right foot Colchicine ordered 1.2 mg x1, then 0.6 mg daily.   Chronic systolic heart failure Patient had a history of V. fib arrest s/p ICD placement in July 2021 at [facility]. Patient appears to be compensated at this time.  Continue to monitor.  Chronic atrial fibrillation Rate controlled Anticoagulation on hold due to GI bleed.  Continue to monitor.  COPD Scattered wheezing anteriorly this morning. DuoNebs ordered and nasal cannula oxygen to keep sats greater than 90%.  Hyponatremia Probably secondary to dehydration improved with IV fluids.  Hypertension Blood pressure is controlled.   DVT prophylaxis: SCDs  Code Status: Full code Family Communication: None at bedside Disposition:  Home when medically cleared.   Status is: Inpatient  Remains inpatient appropriate because:Ongoing diagnostic testing needed not appropriate for outpatient work up, IV treatments appropriate due to intensity of illness or inability to take PO and Inpatient level of care appropriate due to severity of illness.  No source of bleeding found on EGD, GI planning inpatient  colonoscopy 11/11 per Dr. Laural Golden.   Dispo: The patient is from: Home              Anticipated d/c is to: Home              Anticipated d/c date is: 2  days              Patient currently is not medically stable to d/c.   Consultants:   Gastroenterology.    Procedures: none.    Antimicrobials: none.    Subjective: Pt reports gout pain in right foot, requesting colchicine   Objective: Vitals:   01/06/20 1820 01/06/20 2021 01/06/20 2150 01/07/20 0500  BP:   (!) 141/55 127/69  Pulse:   73 75  Resp:   17   Temp:   99.2 F (37.3 C) 99.5 F (37.5 C)  TempSrc:   Oral Oral  SpO2: 100% 93% 98% 100%  Weight:      Height:        Intake/Output Summary (Last 24 hours) at 01/07/2020 1322 Last data filed at 01/07/2020 1225 Gross per 24 hour  Intake 255.15 ml  Output 1402 ml  Net -1146.85 ml   Filed Weights   01/04/20 1751  Weight: 68.5 kg    Examination:  General exam: Appears calm and comfortable on 3LNC oxygen, wheezing more today.  Respiratory system: BBS clear to auscultation. Respiratory effort normal. Cardiovascular system: normal S1 & S2 heard, irregularly irregular. No JVD,No pedal edema. Gastrointestinal system: Abdomen is nondistended, soft and nontender.  Normal bowel sounds heard. Central nervous system: Alert and oriented. No focal neurological deficits. Extremities: Symmetric 5 x 5 power. Skin: No rashes, lesions or ulcers Psychiatry: Mood & affect appropriate.   Data Reviewed: I have personally reviewed following labs and imaging studies  CBC: Recent Labs  Lab 01/04/20 1923 01/05/20 0427 01/05/20 1408 01/05/20 1943 01/07/20 0440  WBC 13.3* 9.9  --   --  10.6*  NEUTROABS 9.9*  --   --   --   --   HGB 4.9* 6.6* 8.2* 8.3* 8.4*  HCT 17.6* 21.7* 26.2* 27.9* 27.7*  MCV 86.7 87.5  --   --  86.0  PLT 352 288  --   --  154    Basic Metabolic Panel: Recent Labs  Lab 01/04/20 1923 01/05/20 0427 01/05/20 1943 01/06/20 0503 01/07/20 0440  NA 132* 134* 132* 133* 131*  K 6.1* 5.7* 4.8 4.7 4.6  CL 102 103 103 101 98  CO2 20* 21* 18* 22 24  GLUCOSE 148* 116* 167* 117* 100*  BUN 41* 43* 30* 24* 18    CREATININE 2.41* 2.10* 1.70* 1.45* 1.32*  CALCIUM 8.2* 8.3* 8.3* 8.5* 8.3*  MG  --  2.8*  --   --  2.1  PHOS  --  5.3*  --   --   --     GFR: Estimated Creatinine Clearance: 46.1 mL/min (A) (by C-G formula based on SCr of 1.32 mg/dL (H)).  Liver Function Tests: Recent Labs  Lab 01/04/20 1923 01/05/20 0427  AST 30 22  ALT 37 31  ALKPHOS 75 71  BILITOT 0.3 0.4  PROT 7.2 6.9  ALBUMIN 3.2* 3.1*    CBG: Recent Labs  Lab 01/06/20 1229  GLUCAP 116*     Recent Results (from the past 240 hour(s))  Respiratory Panel by RT PCR (Flu A&B, Covid) - Nasopharyngeal Swab     Status: None   Collection Time: 01/04/20  6:53 PM  Specimen: Nasopharyngeal Swab  Result Value Ref Range Status   SARS Coronavirus 2 by RT PCR NEGATIVE NEGATIVE Final    Comment: (NOTE) SARS-CoV-2 target nucleic acids are NOT DETECTED.  The SARS-CoV-2 RNA is generally detectable in upper respiratoy specimens during the acute phase of infection. The lowest concentration of SARS-CoV-2 viral copies this assay can detect is 131 copies/mL. A negative result does not preclude SARS-Cov-2 infection and should not be used as the sole basis for treatment or other patient management decisions. A negative result may occur with  improper specimen collection/handling, submission of specimen other than nasopharyngeal swab, presence of viral mutation(s) within the areas targeted by this assay, and inadequate number of viral copies (<131 copies/mL). A negative result must be combined with clinical observations, patient history, and epidemiological information. The expected result is Negative.  Fact Sheet for Patients:  PinkCheek.be  Fact Sheet for Healthcare Providers:  GravelBags.it  This test is no t yet approved or cleared by the [place] FDA and  has been authorized for detection and/or diagnosis of SARS-CoV-2 by FDA under an Emergency Use  Authorization (EUA). This EUA will remain  in effect (meaning this test can be used) for the duration of the COVID-19 declaration under Section 564(b)(1) of the Act, 21 U.S.C. section 360bbb-3(b)(1), unless the authorization is terminated or revoked sooner.     Influenza A by PCR NEGATIVE NEGATIVE Final   Influenza B by PCR NEGATIVE NEGATIVE Final    Comment: (NOTE) The Xpert Xpress SARS-CoV-2/FLU/RSV assay is intended as an aid in  the diagnosis of influenza from Nasopharyngeal swab specimens and  should not be used as a sole basis for treatment. Nasal washings and  aspirates are unacceptable for Xpert Xpress SARS-CoV-2/FLU/RSV  testing.  Fact Sheet for Patients: PinkCheek.be  Fact Sheet for Healthcare Providers: GravelBags.it  This test is not yet approved or cleared by the [place] FDA and  has been authorized for detection and/or diagnosis of SARS-CoV-2 by  FDA under an Emergency Use Authorization (EUA). This EUA will remain  in effect (meaning this test can be used) for the duration of the  Covid-19 declaration under Section 564(b)(1) of the Act, 21  U.S.C. section 360bbb-3(b)(1), unless the authorization is  terminated or revoked. Performed at [facility], [address], [place], [place] [address]     Radiology Studies: No results found. Scheduled Meds: . amiodarone  200 mg Oral Daily  . colchicine  1.2 mg Oral Once   Followed by  . [START ON 01/08/2020] colchicine  0.6 mg Oral Daily  . linaclotide  290 mcg Oral QAC breakfast  . [START ON 01/08/2020] pantoprazole  40 mg Intravenous Q12H  . polyethylene glycol  17 g Oral Q1 Hr x 3  . [START ON 01/08/2020] vitamin B-12  1,000 mcg Oral Daily   Continuous Infusions: . sodium chloride    . pantoprozole (PROTONIX) infusion 8 mg/hr (01/07/20 1024)     LOS: 3 days   Irwin Brakeman, MD How to contact the [facility] Attending or Consulting provider [place] or  covering provider during after hours [place], for this patient?  1. Check the care team in [facility] and look for a) attending/consulting TRH provider listed and b) the [facility] team listed 2. Log into [email or web address] and use [place]'s universal password to access. If you do not have the password, please contact the hospital operator. 3. Locate the [facility] provider you are looking for under [facility] and page to a number  that you can be directly reached. 4. If you still have difficulty reaching the provider, please page the [facility] (Director on Call) for the Hospitalists listed on amion for assistance.   To contact the attending provider between 7A-7P or the covering provider during after hours 7P-7A, please log into the web site [email or web address] and access using universal [place] password for that web site. If you do not have the password, please call the hospital operator.  01/07/2020, 1:22 PM

## 2020-01-07 NOTE — Plan of Care (Signed)

## 2020-01-07 NOTE — Progress Notes (Signed)
Spoke to nursing staff, Deirdre Pippins RN. Patient passing large hard stools. Will cancel colonoscopy for today and reassess for tomorrow.   Continue bowel prep today, clear liquid diet.   Laureen Ochs. Bernarda Caffey [facility] [phone] 11/11/20211:02 PM

## 2020-01-08 ENCOUNTER — Inpatient Hospital Stay (HOSPITAL_COMMUNITY): Payer: PPO | Admitting: Anesthesiology

## 2020-01-08 ENCOUNTER — Encounter (HOSPITAL_COMMUNITY)
Admission: EM | Disposition: A | Discharge status: Home Health | Payer: Self-pay | Source: Home / Self Care | Attending: Family Medicine

## 2020-01-08 ENCOUNTER — Encounter (HOSPITAL_COMMUNITY): Payer: Self-pay | Admitting: Internal Medicine

## 2020-01-08 ENCOUNTER — Other Ambulatory Visit: Payer: Self-pay

## 2020-01-08 DIAGNOSIS — R195 Other fecal abnormalities: Secondary | ICD-10-CM

## 2020-01-08 DIAGNOSIS — D509 Iron deficiency anemia, unspecified: Secondary | ICD-10-CM

## 2020-01-08 HISTORY — PX: COLONOSCOPY WITH PROPOFOL: SHX5780

## 2020-01-08 LAB — BASIC METABOLIC PANEL
Anion gap: 10 (ref 5–15)
BUN: 18 mg/dL (ref 8–23)
CO2: 24 mmol/L (ref 22–32)
Calcium: 8.5 mg/dL — ABNORMAL LOW (ref 8.9–10.3)
Chloride: 99 mmol/L (ref 98–111)
Creatinine, Ser: 1.42 mg/dL — ABNORMAL HIGH (ref 0.61–1.24)
GFR, Estimated: 51 mL/min — ABNORMAL LOW (ref >60)
Glucose, Bld: 107 mg/dL — ABNORMAL HIGH (ref 70–99)
Potassium: 4.6 mmol/L (ref 3.5–5.1)
Sodium: 133 mmol/L — ABNORMAL LOW (ref 135–145)

## 2020-01-08 LAB — CBC
HCT: 28.4 % — ABNORMAL LOW (ref 39.0–52.0)
Hemoglobin: 8.4 g/dL — ABNORMAL LOW (ref 13.0–17.0)
MCH: 25.6 pg — ABNORMAL LOW (ref 26.0–34.0)
MCHC: 29.6 g/dL — ABNORMAL LOW (ref 30.0–36.0)
MCV: 86.6 fL (ref 80.0–100.0)
Platelets: 366 10*3/uL (ref 150–400)
RBC: 3.28 MIL/uL — ABNORMAL LOW (ref 4.22–5.81)
RDW: 17.2 % — ABNORMAL HIGH (ref 11.5–15.5)
WBC: 10.2 10*3/uL (ref 4.0–10.5)
nRBC: 0 % (ref 0.0–0.2)

## 2020-01-08 LAB — TYPE AND SCREEN
ABO/RH(D): B POS
Antibody Screen: NEGATIVE

## 2020-01-08 LAB — PREPARE RBC (CROSSMATCH)

## 2020-01-08 LAB — GLUCOSE, CAPILLARY: Glucose-Capillary: 91 mg/dL (ref 70–99)

## 2020-01-08 SURGERY — COLONOSCOPY WITH PROPOFOL
Anesthesia: General

## 2020-01-08 MED ORDER — SODIUM CHLORIDE 0.9% IV SOLUTION: Freq: Once | INTRAVENOUS | Status: DC

## 2020-01-08 MED ORDER — PROPOFOL 500 MG/50ML IV EMUL
INTRAVENOUS | Status: DC | PRN
  Administered 2020-01-08: 100 ug/kg/min via INTRAVENOUS

## 2020-01-08 MED ORDER — ATORVASTATIN CALCIUM 80 MG PO TABS: 80.0000 mg | ORAL_TABLET | Freq: Every day | ORAL | 0 refills | Status: DC

## 2020-01-08 MED ORDER — PHENYLEPHRINE 40 MCG/ML (10ML) SYRINGE FOR IV PUSH (FOR BLOOD PRESSURE SUPPORT)
PREFILLED_SYRINGE | INTRAVENOUS | Status: DC | PRN
  Administered 2020-01-08: 120 ug via INTRAVENOUS
  Administered 2020-01-08 (×2): 80 ug via INTRAVENOUS

## 2020-01-08 MED ORDER — PROPOFOL 10 MG/ML IV BOLUS
INTRAVENOUS | Status: DC | PRN
  Administered 2020-01-08: 30 mg via INTRAVENOUS
  Administered 2020-01-08: 70 mg via INTRAVENOUS

## 2020-01-08 MED ORDER — AMIODARONE HCL 200 MG PO TABS
200.0000 mg | ORAL_TABLET | Freq: Every day | ORAL | Status: DC
Start: 2020-01-09 — End: 2020-05-03

## 2020-01-08 MED ORDER — SODIUM CHLORIDE 0.9 % IV SOLN: INTRAVENOUS | Status: DC

## 2020-01-08 MED ORDER — CYANOCOBALAMIN 1000 MCG PO TABS
1000.0000 ug | ORAL_TABLET | Freq: Every day | ORAL | 2 refills | Status: DC
Start: 2020-01-09 — End: 2020-12-29

## 2020-01-08 MED ORDER — LACTATED RINGERS IV SOLN: INTRAVENOUS | Status: DC | PRN

## 2020-01-08 MED ORDER — LACTATED RINGERS IV SOLN: Freq: Once | INTRAVENOUS | Status: AC

## 2020-01-08 MED ORDER — LIDOCAINE HCL (CARDIAC) PF 100 MG/5ML IV SOSY
PREFILLED_SYRINGE | INTRAVENOUS | Status: DC | PRN
  Administered 2020-01-08: 50 mg via INTRAVENOUS

## 2020-01-08 NOTE — Progress Notes (Signed)
Spoke with patient who states he took his prep medicine yesterday. Not sure if bowel movements are loose/clear. Spoke with nursing staff who states he took Linzess, MiraLAX x 7, and Dulcolax x 3 yesterday. No bowel movement this morning, had a large bowel movement yesterday. No sure if stools were loose/clear yet.  Will put in for enema x 2, nurse to call me with stool results.  Patient states "if I can't have this procedure today I'm going home."  Discussed with Dr. Abbey Chatters, plan for colonoscopy attempt today. Will schedule.  Thank you for allowing [place] to participate in the care of Peter Lambert  Walden Field, DNP, AGNP-C Adult & Gerontological Nurse Practitioner [facility]

## 2020-01-08 NOTE — Op Note (Signed)
[facility] Patient Name: Peter Lambert Procedure Date: 01/08/2020 12:53 PM MRN: [id number] Date of Birth: [birth year] Attending MD: Elon Alas. Edgar Frisk CSN: [id number] Age: 74 Admit Type: Outpatient Procedure:                Colonoscopy Indications:              Heme positive stool, Iron deficiency anemia Providers:                Elon Alas. Abbey Chatters, DO, Lambert Mody, Aram Candela Referring MD:              Medicines:                See the Anesthesia note for documentation of the                            administered medications Complications:            No immediate complications. Estimated Blood Loss:     Estimated blood loss: none. Procedure:                Pre-Anesthesia Assessment:                           - The anesthesia plan was to use monitored                            anesthesia care (MAC).                           After obtaining informed consent, the colonoscope                            was passed under direct vision. Throughout the                            procedure, the patient's blood pressure, pulse, and                            oxygen saturations were monitored continuously. The                            [id number]-HQ190L ([id number]) scope was introduced through                            the anus and advanced to the the cecum, identified                            by appendiceal orifice and ileocecal valve. The                            colonoscopy was performed without difficulty. The                            patient tolerated the procedure well. The quality  of the bowel preparation was evaluated using the                            BBPS [facility] Bowel Preparation Scale) with scores                            of: Right Colon = 1 (portion of mucosa seen, but                            other areas not well seen due to staining, residual                            stool and/or opaque liquid),  Transverse Colon = 1                            (portion of mucosa seen, but other areas not well                            seen due to staining, residual stool and/or opaque                            liquid) and Left Colon = 2 (minor amount of                            residual staining, small fragments of stool and/or                            opaque liquid, but mucosa seen well). The total                            BBPS score equals 4. The quality of the bowel                            preparation was inadequate. Scope In: 1:46:24 PM Scope Out: 2:00:47 PM Scope Withdrawal Time: 0 hours 6 minutes 11 seconds  Total Procedure Duration: 0 hours 14 minutes 23 seconds  Findings:      The perianal and digital rectal examinations were normal.      Non-bleeding internal hemorrhoids were found during endoscopy.      Scattered small and large-mouthed diverticula were found in the entire       colon.      A large amount of stool was found in the entire colon, precluding       visualization. Lavage of the area was performed using copious amounts of       sterile water, resulting in incomplete clearance with continued poor       visualization.      No evidence of active bleeding in the entire colon. I did not see any       obvious large polyps or colon cancer. Given the poor prep, I could have       missed small polyps or AVMs. Impression:               - Preparation of the colon was inadequate.                           -  Non-bleeding internal hemorrhoids.                           - Diverticulosis in the entire examined colon.                           - Stool in the entire examined colon.                           - No specimens collected. Moderate Sedation:      Per Anesthesia Care Recommendation:           - Return patient to hospital ward for ongoing care.                           - Advance diet as tolerated.                           - Continue present medications.                            - No evidence of active bleeding in the entire                            colon. I did not see any obvious large polyps or                            colon cancer. Given the poor prep, I could have                            missed small polyps or AVMs.                           - Repeat colonoscopy in 3 months because the bowel                            preparation was poor. Procedure Code(s):        --- Professional ---                           [phone], Colonoscopy, flexible; diagnostic, including                            collection of specimen(s) by brushing or washing,                            when performed (separate procedure) Diagnosis Code(s):        --- Professional ---                           O35.0, Other hemorrhoids                           R19.5, Other fecal abnormalities  D50.9, Iron deficiency anemia, unspecified                           K57.30, Diverticulosis of large intestine without                            perforation or abscess without bleeding CPT copyright 2019 American Medical Association. All rights reserved. The codes documented in this report are preliminary and upon coder review may  be revised to meet current compliance requirements. Elon Alas. Abbey Chatters, DO [place] Abbey Chatters, DO 01/08/2020 2:05:12 PM This report has been signed electronically. Number of Addenda: 0

## 2020-01-08 NOTE — Plan of Care (Signed)
Problem: Education: Goal: Knowledge of General Education information will improve Description: Including pain rating scale, medication(s)/side effects and non-pharmacologic comfort measures 01/08/2020 1604 by Melony Overly, RN Outcome: Adequate for Discharge 01/08/2020 1604 by Melony Overly, RN Outcome: Adequate for Discharge 01/08/2020 1135 by Melony Overly, RN Outcome: Progressing   Problem: Health Behavior/Discharge Planning: Goal: Ability to manage health-related needs will improve 01/08/2020 1604 by Melony Overly, RN Outcome: Adequate for Discharge 01/08/2020 1604 by Melony Overly, RN Outcome: Adequate for Discharge 01/08/2020 1135 by Melony Overly, RN Outcome: Progressing   Problem: Clinical Measurements: Goal: Ability to maintain clinical measurements within normal limits will improve 01/08/2020 1604 by Melony Overly, RN Outcome: Adequate for Discharge 01/08/2020 1604 by Melony Overly, RN Outcome: Adequate for Discharge 01/08/2020 1135 by Melony Overly, RN Outcome: Progressing Goal: Will remain free from infection 01/08/2020 1604 by Melony Overly, RN Outcome: Adequate for Discharge 01/08/2020 1604 by Melony Overly, RN Outcome: Adequate for Discharge 01/08/2020 1135 by Melony Overly, RN Outcome: Progressing Goal: Diagnostic test results will improve 01/08/2020 1604 by Melony Overly, RN Outcome: Adequate for Discharge 01/08/2020 1604 by Melony Overly, RN Outcome: Adequate for Discharge 01/08/2020 1135 by Melony Overly, RN Outcome: Progressing Goal: Respiratory complications will improve 01/08/2020 1604 by Melony Overly, RN Outcome: Adequate for Discharge 01/08/2020 1604 by Melony Overly, RN Outcome: Adequate for Discharge 01/08/2020 1135 by Melony Overly, RN Outcome: Progressing Goal: Cardiovascular complication will be avoided 01/08/2020 1604 by Melony Overly, RN Outcome: Adequate for Discharge 01/08/2020 1604 by Melony Overly, RN Outcome: Adequate for Discharge 01/08/2020 1135 by Melony Overly, RN Outcome: Progressing   Problem: Activity: Goal: Risk for activity intolerance will decrease 01/08/2020 1604 by Melony Overly, RN Outcome: Adequate for Discharge 01/08/2020 1604 by Melony Overly, RN Outcome: Adequate for Discharge 01/08/2020 1135 by Melony Overly, RN Outcome: Progressing   Problem: Nutrition: Goal: Adequate nutrition will be maintained 01/08/2020 1604 by Melony Overly, RN Outcome: Adequate for Discharge 01/08/2020 1604 by Melony Overly, RN Outcome: Adequate for Discharge 01/08/2020 1135 by Melony Overly, RN Outcome: Progressing   Problem: Coping: Goal: Level of anxiety will decrease 01/08/2020 1604 by Melony Overly, RN Outcome: Adequate for Discharge 01/08/2020 1604 by Melony Overly, RN Outcome: Adequate for Discharge 01/08/2020 1135 by Melony Overly, RN Outcome: Progressing   Problem: Elimination: Goal: Will not experience complications related to bowel motility 01/08/2020 1604 by Melony Overly, RN Outcome: Adequate for Discharge 01/08/2020 1604 by Melony Overly, RN Outcome: Adequate for Discharge 01/08/2020 1135 by Melony Overly, RN Outcome: Progressing Goal: Will not experience complications related to urinary retention 01/08/2020 1604 by Melony Overly, RN Outcome: Adequate for Discharge 01/08/2020 1604 by Melony Overly, RN Outcome: Adequate for Discharge 01/08/2020 1135 by Melony Overly, RN Outcome: Progressing   Problem: Pain Managment: Goal: General experience of comfort will improve 01/08/2020 1604 by Melony Overly, RN Outcome: Adequate for Discharge 01/08/2020 1604 by Melony Overly, RN Outcome: Adequate for Discharge 01/08/2020 1135 by Melony Overly, RN Outcome: Progressing   Problem: Safety: Goal: Ability to remain free from injury will improve 01/08/2020 1604 by Melony Overly, RN Outcome: Adequate for  Discharge 01/08/2020 1604 by Melony Overly, RN Outcome: Adequate for Discharge 01/08/2020 1135 by Melony Overly, RN Outcome: Progressing   Problem: Skin Integrity: Goal: Risk for impaired skin integrity will  decrease 01/08/2020 1604 by Melony Overly, RN Outcome: Adequate for Discharge 01/08/2020 1604 by Melony Overly, RN Outcome: Adequate for Discharge 01/08/2020 1135 by Melony Overly, RN Outcome: Progressing

## 2020-01-08 NOTE — Transfer of Care (Signed)
Immediate Anesthesia Transfer of Care Note  Patient: Peter Lambert  Procedure(s) Performed: COLONOSCOPY WITH PROPOFOL (N/A )  Patient Location: PACU  Anesthesia Type:General  Level of Consciousness: drowsy  Airway & Oxygen Therapy: Patient Spontanous Breathing and Patient connected to nasal cannula oxygen  Post-op Assessment: Report given to RN and Post -op Vital signs reviewed and stable  Post vital signs: Reviewed and stable  Last Vitals:  Vitals Value Taken Time  BP    Temp    Pulse 74 01/08/20 1405  Resp 15 01/08/20 1405  SpO2 89 % 01/08/20 1405  Vitals shown include unvalidated device data.  Last Pain:  Vitals:   01/08/20 1343  TempSrc:   PainSc: 0-No pain         Complications: No complications documented.

## 2020-01-08 NOTE — Discharge Summary (Addendum)
Physician Discharge Summary  Peter Lambert VOH:[id number] DOB: 10-27-1945 DOA: 01/04/2020  PCP: Redmond School, MD  Admit date: 01/04/2020 Discharge date: 01/08/2020  Admitted From:  Home  Disposition:  Home   Recommendations for Outpatient Follow-up:  1. Follow up with PCP in 1 week 2. Follow up with GI in 3 months for repeat colonoscopy due to poor prep with first attempt 3. Please CBC in 1-2 weeks 4. Please monitor B12 level and provide B12 injections to correct low B12 level   Discharge Condition: STABLE   CODE STATUS: FULL    Brief Hospitalization Summary: Please see all hospital notes, images, labs for full details of the hospitalization. ADMISSION HPI: Peter Lambert is a 74 y.o. male with medical history significant for COPD, CAD, HFrEF (LVEF 20% 08/30/2022), hypertension, hyperlipidemia, A. fib on Eliquis, CKD 3 who presents to the emergency department due to 2-3 weeks of generalized weakness with occasional imbalance.  Patient states that he had a fall yesterday (11/7) due to weakness without hurting himself, he states that he almost fell again today, but he was able to catch himself prior to falling.  Patient's son insisted that he should go to the ED for further evaluation.  He had a V. fib with cardiac arrest in July of this year and was hospitalized at [facility] where a [place] defibrillator was placed.  He denies near fainting episode, headache, fever, chills, nausea, vomiting.  Patient denies noting blood in stool prior to arrival to the ED.  ED Course:  In the emergency department, SBP was in low 100s, orthostatic BP was positive from sitting to standing.  Work-up in the ED showed H/H 4.9/17.6 with MCV of 86.7, hyponatremia, hyperkalemia, BUN to creatinine 41/2.41 (baseline creatinine around 1.3-1.5), hyperglycemia, hypoalbuminemia.  Stool occult blood was positive.  Chest x-ray showed mild vascular congestion and trace pleural fluid.  IV hydration with NS was  given, Lokelma x1 was given and IV calcium gluconate due to hyperkalemia was given.  Type and screen and 2 units of blood was ordered.  Hospitalist was asked to admit patient for further evaluation and management.  Hospital Course  Generalized weakness probably secondary to symptomatic anemia/severe anemia.  Patient's hemoglobin on admission was 4.9 in the setting of anticoagulation.  Baseline hemoglobin between 9-10 2 units of PRBC transfusion ordered and Hg has improved and has held stable.  GI consulted and he will need further evaluation with an EGD and a colonoscopy. Iron studies with findings of B12 deficiency, fecal occult blood is positive. GI planning for colonoscopy 11/11, due to poor bowel prep, colonoscopy rescheduled for 11/12. On 11/12 colonoscopy was done but prep was poor but no major bleeding was found.  Pt will need to have a repeat colonoscopy done in 3 months.     AKI and hyperkalemia - resolved now.  Secondary to GI blood loss. Avoid nephrotoxic agents/hypotension ACE and ARB. Improved with hydration.   Hyperkalemia - RESOLVED Lokelma ws given to treat this  Acute Gout right foot Colchicine ordered 1.2 mg x1, then 0.6 mg daily.  Symptoms have resolved and he is back to his baseline.   Chronic systolic heart failure Patient had a history of V. fib arrest s/p ICD placement in July 2021 at [facility]. Patient appears to be compensated at this time.  Continue to monitor.  Follow up with EP cardiology as they also monitor ICD remotely.   Chronic atrial fibrillation Rate controlled Anticoagulation was temporarily held.  Resumed at discharge  due to no source of bleeding found.  If bleeding recurs, would likely need to stop anticoagulation.  COPD Scattered wheezing anteriorly this morning. DuoNebs ordered and symptoms have improved.  Marland Kitchen  Hyponatremia - RESOLVED  Probably secondary to dehydration improved with IV fluids.  Hypertension Blood pressure is  controlled.  DVT prophylaxis: SCDs  Code Status: Full code Family Communication: None at bedside Disposition:  Home when medically cleared  Discharge Diagnoses:  Active Problems:   Essential hypertension   Hyperlipidemia   Type 2 diabetes mellitus (HCC)   COPD (chronic obstructive pulmonary disease) (HCC)   Acute renal failure (HCC)   Hyperglycemia   Hypoalbuminemia   Anemia   Anticoagulated   ICD (implantable cardioverter-defibrillator) in place   GI bleed   Orthostatic hypotension   Acute blood loss anemia   Generalized weakness   Hyponatremia   Hyperkalemia   CAD (coronary artery disease)   HFrEF (heart failure with reduced ejection fraction) (HCC)   Accidental fall   Prolonged QT interval   Atrial fibrillation, chronic ([place])   Paraesophageal hernia   Discharge Instructions:  Allergies as of 01/08/2020   No Known Allergies     Medication List    STOP taking these medications   spironolactone 50 MG tablet Commonly known as: ALDACTONE     TAKE these medications   acetaminophen 325 MG tablet Commonly known as: TYLENOL Take 2 tablets (650 mg total) by mouth every 6 (six) hours as needed for mild pain, fever or headache (or Fever >/= 101).   albuterol 108 (90 Base) MCG/ACT inhaler Commonly known as: VENTOLIN HFA Inhale 2 puffs into the lungs every 4 (four) hours as needed for wheezing or shortness of breath.   amiodarone 200 MG tablet Commonly known as: PACERONE Take 1 tablet (200 mg total) by mouth daily. Start taking on: January 09, 2020 What changed: See the new instructions.   apixaban 5 MG Tabs tablet Commonly known as: ELIQUIS Take 1 tablet (5 mg total) by mouth 2 (two) times daily.   aspirin EC 81 MG tablet Take 1 tablet (81 mg total) by mouth daily with breakfast.   atorvastatin 80 MG tablet Commonly known as: LIPITOR Take 1 tablet (80 mg total) by mouth daily. What changed: how to take this   carvedilol 3.125 MG tablet Commonly known  as: COREG Take 1 tablet (3.125 mg total) by mouth 2 (two) times daily with a meal.   colchicine 0.6 MG tablet Take 0.6 mg by mouth daily.   cyanocobalamin 1000 MCG tablet Take 1 tablet (1,000 mcg total) by mouth daily. Start taking on: January 09, 2020   fluocinonide cream 0.05 % Commonly known as: LIDEX Apply 1 application topically 2 (two) times daily.   guaiFENesin 600 MG 12 hr tablet Commonly known as: MUCINEX Take 600 mg by mouth.   losartan 25 MG tablet Commonly known as: COZAAR Take 1 tablet (25 mg total) by mouth daily.   nitroGLYCERIN 0.4 MG SL tablet Commonly known as: NITROSTAT PLACE 1 TABLET UNDER THE TONGUE EVERY 5 MINUTES AS NEEDED FOR CHEST PAIN What changed: See the new instructions.   polyethylene glycol 17 g packet Commonly known as: MIRALAX / GLYCOLAX Take 17 g by mouth daily. For bowel What changed:   when to take this  reasons to take this  additional instructions       Follow-up Information    Redmond School, MD. Schedule an appointment as soon as possible for a visit in 1 week(s).  Specialty: Internal Medicine Contact information: [address] [place] [address] [phone]        Lorretta Harp, MD .   Specialties: Cardiology, Radiology Contact information: [address] [place] [address] [place], Bruno, DO. Schedule an appointment as soon as possible for a visit in 3 month(s).   Specialty: Internal Medicine Contact information: [place] [address] [phone]              No Known Allergies Allergies as of 01/08/2020   No Known Allergies     Medication List    STOP taking these medications   spironolactone 50 MG tablet Commonly known as: ALDACTONE     TAKE these medications   acetaminophen 325 MG tablet Commonly known as: TYLENOL Take 2 tablets (650 mg total) by mouth every 6 (six) hours as needed for mild pain, fever or headache  (or Fever >/= 101).   albuterol 108 (90 Base) MCG/ACT inhaler Commonly known as: VENTOLIN HFA Inhale 2 puffs into the lungs every 4 (four) hours as needed for wheezing or shortness of breath.   amiodarone 200 MG tablet Commonly known as: PACERONE Take 1 tablet (200 mg total) by mouth daily. Start taking on: January 09, 2020 What changed: See the new instructions.   apixaban 5 MG Tabs tablet Commonly known as: ELIQUIS Take 1 tablet (5 mg total) by mouth 2 (two) times daily.   aspirin EC 81 MG tablet Take 1 tablet (81 mg total) by mouth daily with breakfast.   atorvastatin 80 MG tablet Commonly known as: LIPITOR Take 1 tablet (80 mg total) by mouth daily. What changed: how to take this   carvedilol 3.125 MG tablet Commonly known as: COREG Take 1 tablet (3.125 mg total) by mouth 2 (two) times daily with a meal.   colchicine 0.6 MG tablet Take 0.6 mg by mouth daily.   cyanocobalamin 1000 MCG tablet Take 1 tablet (1,000 mcg total) by mouth daily. Start taking on: January 09, 2020   fluocinonide cream 0.05 % Commonly known as: LIDEX Apply 1 application topically 2 (two) times daily.   guaiFENesin 600 MG 12 hr tablet Commonly known as: MUCINEX Take 600 mg by mouth.   losartan 25 MG tablet Commonly known as: COZAAR Take 1 tablet (25 mg total) by mouth daily.   nitroGLYCERIN 0.4 MG SL tablet Commonly known as: NITROSTAT PLACE 1 TABLET UNDER THE TONGUE EVERY 5 MINUTES AS NEEDED FOR CHEST PAIN What changed: See the new instructions.   polyethylene glycol 17 g packet Commonly known as: MIRALAX / GLYCOLAX Take 17 g by mouth daily. For bowel What changed:   when to take this  reasons to take this  additional instructions       Procedures/Studies: DG Chest 2 View  Result Date: 01/04/2020 CLINICAL DATA:  Weakness.  Syncope. EXAM: CHEST - 2 VIEW COMPARISON:  September 16, 2019 FINDINGS: There is a dual chamber pacemaker/ICD in place. The lung volumes are low. The  heart size is stable. There is mild vascular congestion. There is a small amount of fluid in the fissures. There is no pneumothorax. IMPRESSION: Mild vascular congestion and trace pleural fluid. Electronically Signed   By: Constance Holster M.D.   On: 01/04/2020 19:26   CUP PACEART INCLINIC DEVICE CHECK  Result Date: 12/15/2019 Pacemaker check in clinic. Normal device function. Thresholds, sensing, impedances consistent with previous measurements. Device programmed to maximize longevity. No mode  switch or high ventricular rates noted. Device programmed at appropriate safety margins. Histogram distribution appropriate for patient activity level. Device programmed to optimize intrinsic conduction. Outputs programmed to chronic settings with 2:1 safety margin, RA- 2.0V RV- 2.2V. Estimated longevity 13 years. Patient enrolled in remote follow-up 03/07/2020. Patient education completed.Lavenia Atlas, BSN, RN     Subjective: Pt adamant he wants to go home. No black or bloody stools.   Discharge Exam: Vitals:   01/08/20 1415 01/08/20 1457  BP: (!) 110/59 122/65  Pulse: 92 72  Resp: (!) 25 20  Temp:  98.5 F (36.9 C)  SpO2: 100% 100%   Vitals:   01/08/20 1403 01/08/20 1404 01/08/20 1415 01/08/20 1457  BP: (!) 95/53  (!) 110/59 122/65  Pulse: 75  92 72  Resp: 17  (!) 25 20  Temp: 97.8 F (36.6 C)   98.5 F (36.9 C)  TempSrc:    Oral  SpO2: (!) 89% 100% 100% 100%  Weight:      Height:       General exam: Appears calm and comfortable.   Respiratory system: BBS clear to auscultation. Respiratory effort normal. Cardiovascular system: normal S1 & S2 heard, irregularly irregular. No JVD,No pedal edema. Gastrointestinal system: Abdomen is nondistended, soft and nontender.  Normal bowel sounds heard. Central nervous system: Alert and oriented. No focal neurological deficits. Extremities: Symmetric 5 x 5 power. Skin: No rashes, lesions or ulcers Psychiatry: Mood & affect appropriate.    The  results of significant diagnostics from this hospitalization (including imaging, microbiology, ancillary and laboratory) are listed below for reference.     Microbiology: Recent Results (from the past 240 hour(s))  Respiratory Panel by RT PCR (Flu A&B, Covid) - Nasopharyngeal Swab     Status: None   Collection Time: 01/04/20  6:53 PM   Specimen: Nasopharyngeal Swab  Result Value Ref Range Status   SARS Coronavirus 2 by RT PCR NEGATIVE NEGATIVE Final    Comment: (NOTE) SARS-CoV-2 target nucleic acids are NOT DETECTED.  The SARS-CoV-2 RNA is generally detectable in upper respiratoy specimens during the acute phase of infection. The lowest concentration of SARS-CoV-2 viral copies this assay can detect is 131 copies/mL. A negative result does not preclude SARS-Cov-2 infection and should not be used as the sole basis for treatment or other patient management decisions. A negative result may occur with  improper specimen collection/handling, submission of specimen other than nasopharyngeal swab, presence of viral mutation(s) within the areas targeted by this assay, and inadequate number of viral copies (<131 copies/mL). A negative result must be combined with clinical observations, patient history, and epidemiological information. The expected result is Negative.  Fact Sheet for Patients:  PinkCheek.be  Fact Sheet for Healthcare Providers:  GravelBags.it  This test is no t yet approved or cleared by the [place] FDA and  has been authorized for detection and/or diagnosis of SARS-CoV-2 by FDA under an Emergency Use Authorization (EUA). This EUA will remain  in effect (meaning this test can be used) for the duration of the COVID-19 declaration under Section 564(b)(1) of the Act, 21 U.S.C. section 360bbb-3(b)(1), unless the authorization is terminated or revoked sooner.     Influenza A by PCR NEGATIVE NEGATIVE Final    Influenza B by PCR NEGATIVE NEGATIVE Final    Comment: (NOTE) The Xpert Xpress SARS-CoV-2/FLU/RSV assay is intended as an aid in  the diagnosis of influenza from Nasopharyngeal swab specimens and  should not be used as a sole basis for treatment.  Nasal washings and  aspirates are unacceptable for Xpert Xpress SARS-CoV-2/FLU/RSV  testing.  Fact Sheet for Patients: PinkCheek.be  Fact Sheet for Healthcare Providers: GravelBags.it  This test is not yet approved or cleared by the [place] FDA and  has been authorized for detection and/or diagnosis of SARS-CoV-2 by  FDA under an Emergency Use Authorization (EUA). This EUA will remain  in effect (meaning this test can be used) for the duration of the  Covid-19 declaration under Section 564(b)(1) of the Act, 21  U.S.C. section 360bbb-3(b)(1), unless the authorization is  terminated or revoked. Performed at [facility], [address] [place],  [address]      Labs: BNP (last 3 results) Recent Labs    04/20/19 0846 04/23/19 2037 08/29/19 1819  BNP 580.0* 793.0* 654.6*   Basic Metabolic Panel: Recent Labs  Lab 01/05/20 0427 01/05/20 1943 01/06/20 0503 01/07/20 0440 01/08/20 0708  NA 134* 132* 133* 131* 133*  K 5.7* 4.8 4.7 4.6 4.6  CL 103 103 101 98 99  CO2 21* 18* 22 24 24   GLUCOSE 116* 167* 117* 100* 107*  BUN 43* 30* 24* 18 18  CREATININE 2.10* 1.70* 1.45* 1.32* 1.42*  CALCIUM 8.3* 8.3* 8.5* 8.3* 8.5*  MG 2.8*  --   --  2.1  --   PHOS 5.3*  --   --   --   --    Liver Function Tests: Recent Labs  Lab 01/04/20 1923 01/05/20 0427  AST 30 22  ALT 37 31  ALKPHOS 75 71  BILITOT 0.3 0.4  PROT 7.2 6.9  ALBUMIN 3.2* 3.1*   No results for input(s): LIPASE, AMYLASE in the last 168 hours. No results for input(s): AMMONIA in the last 168 hours. CBC: Recent Labs  Lab 01/04/20 1923 01/04/20 1923 01/05/20 0427 01/05/20 1408 01/05/20 1943  01/07/20 0440 01/08/20 0708  WBC 13.3*  --  9.9  --   --  10.6* 10.2  NEUTROABS 9.9*  --   --   --   --   --   --   HGB 4.9*   < > 6.6* 8.2* 8.3* 8.4* 8.4*  HCT 17.6*   < > 21.7* 26.2* 27.9* 27.7* 28.4*  MCV 86.7  --  87.5  --   --  86.0 86.6  PLT 352  --  288  --   --  350 366   < > = values in this interval not displayed.   Cardiac Enzymes: No results for input(s): CKTOTAL, CKMB, CKMBINDEX, TROPONINI in the last 168 hours. BNP: Invalid input(s): POCBNP CBG: Recent Labs  Lab 01/06/20 1229 01/08/20 1408  GLUCAP 116* 91   D-Dimer No results for input(s): DDIMER in the last 72 hours. Hgb A1c No results for input(s): HGBA1C in the last 72 hours. Lipid Profile No results for input(s): CHOL, HDL, LDLCALC, TRIG, CHOLHDL, LDLDIRECT in the last 72 hours. Thyroid function studies No results for input(s): TSH, T4TOTAL, T3FREE, THYROIDAB in the last 72 hours.  Invalid input(s): FREET3 Anemia work up Recent Labs    01/05/20 1943  VITAMINB12 77*  FOLATE 5.1*  FERRITIN 27  TIBC 375  IRON 14*   Urinalysis    Component Value Date/Time   COLORURINE YELLOW 01/05/2020 0449   APPEARANCEUR HAZY (A) 01/05/2020 0449   LABSPEC 1.015 01/05/2020 [place] 5.0 01/05/2020 [place] 01/05/2020 [place] NEGATIVE 01/05/2020 [place] NEGATIVE 01/05/2020 [place] 01/05/2020 0449  PROTEINUR 30 (A) 01/05/2020 0449   NITRITE NEGATIVE 01/05/2020 0449   LEUKOCYTESUR NEGATIVE 01/05/2020 0449   Sepsis Labs Invalid input(s): PROCALCITONIN,  WBC,  LACTICIDVEN Microbiology Recent Results (from the past 240 hour(s))  Respiratory Panel by RT PCR (Flu A&B, Covid) - Nasopharyngeal Swab     Status: None   Collection Time: 01/04/20  6:53 PM   Specimen: Nasopharyngeal Swab  Result Value Ref Range Status   SARS Coronavirus 2 by RT PCR NEGATIVE NEGATIVE Final    Comment: (NOTE) SARS-CoV-2 target nucleic acids are NOT DETECTED.  The SARS-CoV-2 RNA  is generally detectable in upper respiratoy specimens during the acute phase of infection. The lowest concentration of SARS-CoV-2 viral copies this assay can detect is 131 copies/mL. A negative result does not preclude SARS-Cov-2 infection and should not be used as the sole basis for treatment or other patient management decisions. A negative result may occur with  improper specimen collection/handling, submission of specimen other than nasopharyngeal swab, presence of viral mutation(s) within the areas targeted by this assay, and inadequate number of viral copies (<131 copies/mL). A negative result must be combined with clinical observations, patient history, and epidemiological information. The expected result is Negative.  Fact Sheet for Patients:  PinkCheek.be  Fact Sheet for Healthcare Providers:  GravelBags.it  This test is no t yet approved or cleared by the [place] FDA and  has been authorized for detection and/or diagnosis of SARS-CoV-2 by FDA under an Emergency Use Authorization (EUA). This EUA will remain  in effect (meaning this test can be used) for the duration of the COVID-19 declaration under Section 564(b)(1) of the Act, 21 U.S.C. section 360bbb-3(b)(1), unless the authorization is terminated or revoked sooner.     Influenza A by PCR NEGATIVE NEGATIVE Final   Influenza B by PCR NEGATIVE NEGATIVE Final    Comment: (NOTE) The Xpert Xpress SARS-CoV-2/FLU/RSV assay is intended as an aid in  the diagnosis of influenza from Nasopharyngeal swab specimens and  should not be used as a sole basis for treatment. Nasal washings and  aspirates are unacceptable for Xpert Xpress SARS-CoV-2/FLU/RSV  testing.  Fact Sheet for Patients: PinkCheek.be  Fact Sheet for Healthcare Providers: GravelBags.it  This test is not yet approved or cleared by the [place] FDA and  has been authorized for detection and/or diagnosis of SARS-CoV-2 by  FDA under an Emergency Use Authorization (EUA). This EUA will remain  in effect (meaning this test can be used) for the duration of the  Covid-19 declaration under Section 564(b)(1) of the Act, 21  U.S.C. section 360bbb-3(b)(1), unless the authorization is  terminated or revoked. Performed at [facility], [address] [place], [place] [address]    Time coordinating discharge: 40 minutes   SIGNED:  Irwin Brakeman, MD  [facility] 01/08/2020, 3:40 PM How to contact the [facility] Attending or Consulting provider [place] or covering provider during after hours [place], for this patient?  1. Check the care team in [facility] and look for a) attending/consulting [facility] provider listed and b) the [facility] team listed 2. Log into [email or web address] and use 's universal password to access. If you do not have the password, please contact the hospital operator. 3. Locate the [facility] provider you are looking for under [facility] and page to a number that you can be directly reached. 4. If you still have difficulty reaching the provider, please page the [facility] (Director on Call) for the Hospitalists listed on amion for assistance.

## 2020-01-08 NOTE — Interval H&P Note (Signed)
History and Physical Interval Note:  01/08/2020 12:59 PM  [place]  has presented today for surgery, with the diagnosis of acute on chronic anemia, heme + stool.  The various methods of treatment have been discussed with the patient and family. After consideration of risks, benefits and other options for treatment, the patient has consented to  Procedure(s): COLONOSCOPY WITH PROPOFOL (N/A) as a surgical intervention.  [name] history has been reviewed, patient examined, no change in status, stable for surgery.  I have reviewed the patient's chart and labs.  Questions were answered to the patient's satisfaction.     Eloise [name]

## 2020-01-08 NOTE — Plan of Care (Signed)

## 2020-01-08 NOTE — Discharge Instructions (Signed)
PLEASE FOLLOW UP WITH GI TO HAVE REPEAT COLONOSCOPY IN 3 MONTHS PLEASE DISCUSS B12 DEFICIENCY WITH PRIMARY CARE PROVIDER AND START B12 INJECTIONS IF ABLE    Gastrointestinal Bleeding Gastrointestinal (GI) bleeding is bleeding somewhere along the path that food travels through the body (digestive tract). This path is anywhere between the mouth and the opening of the butt (anus). You may have blood in your poop (stool) or have black poop. If you throw up (vomit), there may be blood in it. This condition can be mild, serious, or even life-threatening. If you have a lot of bleeding, you may need to stay in the hospital. What are the causes? This condition may be caused by:  Irritation and swelling of the esophagus (esophagitis). The esophagus is part of the body that moves food from your mouth to your stomach.  Swollen veins in the butt (hemorrhoids).  Areas of painful tearing in the opening of the butt (anal fissures). These are often caused by passing hard poop.  Pouches that form on the colon over time (diverticulosis).  Irritation and swelling (diverticulitis) in areas where pouches have formed on the colon.  Growths (polyps) or cancer. Colon cancer often starts out as growths that are not cancer.  Irritation of the stomach lining (gastritis).  Sores (ulcers) in the stomach. What increases the risk? You are more likely to develop this condition if you:  Have a certain type of infection in your stomach (Helicobacter pylori infection).  Take certain medicines.  Smoke.  Drink alcohol. What are the signs or symptoms? Common symptoms of this condition include:  Throwing up (vomiting) material that has bright red blood in it. It may look like coffee grounds.  Changes in your poop. The poop may: ? Have red blood in it. ? Be black, look like tar, and smell stronger than normal. ? Be red.  Pain or cramping in the belly (abdomen). How is this treated? Treatment for this  condition depends on the cause of the bleeding. For example:  Sometimes, the bleeding can be stopped during a procedure that is done to find the problem (endoscopy or colonoscopy).  Medicines can be used to: ? Help control irritation, swelling, or infection. ? Reduce acid in your stomach.  Certain problems can be treated with: ? Creams. ? Medicines that are put in the butt (suppositories). ? Warm baths.  Surgery is sometimes needed.  If you lose a lot of blood, you may need a blood transfusion. If bleeding is mild, you may be allowed to go home. If there is a lot of bleeding, you will need to stay in the hospital. Follow these instructions at home:   Take over-the-counter and prescription medicines only as told by your doctor.  Eat foods that have a lot of fiber in them. These foods include beans, whole grains, and fresh fruits and vegetables. You can also try eating 1-3 prunes each day.  Drink enough fluid to keep your pee (urine) pale yellow.  Keep all follow-up visits as told by your doctor. This is important. Contact a doctor if:  Your symptoms do not get better. Get help right away if:  Your bleeding does not stop.  You feel dizzy or you pass out (faint).  You feel weak.  You have very bad cramps in your back or belly.  You pass large clumps of blood (clots) in your poop.  Your symptoms are getting worse.  You have chest pain or fast heartbeats. Summary  GI bleeding is bleeding somewhere  along the path that food travels through the body (digestive tract).  This bleeding can be caused by many things. Treatment depends on the cause of the bleeding.  Take medicines only as told by your doctor.  Keep all follow-up visits as told by your doctor. This is important. This information is not intended to replace advice given to you by your health care provider. Make sure you discuss any questions you have with your health care provider. Document Revised: 09/25/2017  Document Reviewed: 09/25/2017 Elsevier Patient Education  2020 [employer].   IMPORTANT INFORMATION: PAY CLOSE ATTENTION   PHYSICIAN DISCHARGE INSTRUCTIONS  Follow with Primary care provider  Peter School, MD  and other consultants as instructed by your Hospitalist Physician  [place] IF SYMPTOMS COME BACK, WORSEN OR NEW PROBLEM DEVELOPS   Please note: You were cared for by a hospitalist during your hospital stay. Every effort will be made to forward records to your primary care provider.  You can request that your primary care provider send for your hospital records if they have not received them.  Once you are discharged, your primary care physician will handle any further medical issues. Please note that NO REFILLS for any discharge medications will be authorized once you are discharged, as it is imperative that you return to your primary care physician (or establish a relationship with a primary care physician if you do not have one) for your post hospital discharge needs so that they can reassess your need for medications and monitor your lab values.  Please get a complete blood count and chemistry panel checked by your Primary MD at your next visit, and again as instructed by your Primary MD.  Get Medicines reviewed and adjusted: Please take all your medications with you for your next visit with your Primary MD  Laboratory/radiological data: Please request your Primary MD to go over all hospital tests and procedure/radiological results at the follow up, please ask your primary care provider to get all Hospital records sent to his/her office.  In some cases, they will be blood work, cultures and biopsy results pending at the time of your discharge. Please request that your primary care provider follow up on these results.  If you are diabetic, please bring your blood sugar readings with you to your follow up appointment with primary care.     Please call and make your follow up appointments as soon as possible.    Also Note the following: If you experience worsening of your admission symptoms, develop shortness of breath, life threatening emergency, suicidal or homicidal thoughts you must seek medical attention immediately by calling 911 or calling your MD immediately  if symptoms less severe.  You must read complete instructions/literature along with all the possible adverse reactions/side effects for all the Medicines you take and that have been prescribed to you. Take any new Medicines after you have completely understood and accpet all the possible adverse reactions/side effects.   Do not drive when taking Pain medications or sleeping medications (Benzodiazepines)  Do not take more than prescribed Pain, Sleep and Anxiety Medications. It is not advisable to combine anxiety,sleep and pain medications without talking with your primary care practitioner  Special Instructions: If you have smoked or chewed Tobacco  in the last 2 yrs please stop smoking, stop any regular Alcohol  and or any Recreational drug use.  Wear Seat belts while driving.  Do not drive if taking any narcotic, mind altering or  controlled substances or recreational drugs or alcohol.

## 2020-01-08 NOTE — Evaluation (Signed)
Physical Therapy Evaluation Patient Details Name: Peter Lambert MRN: [id number] DOB: [birth year] Today's Date: 01/08/2020   History of Present Illness  74 year old gentleman prior history of coronary artery disease, COPD, cardiomyopathy with left ventricular ejection fraction of 20%, hypertension, hyperlipidemia, atrial fibrillation on Eliquis for anticoagulation, stage IIIa CKD presents to ED with 2 weeks of generalized weakness and gait instability.  Patient reports he has been having recurrent falls lately.  He was also found to have V. fib with cardiac arrest in July 2021 was hospitalized at [facility] underwent defibrillator placement.  On arrival to ED his blood pressure parameters were borderline he had orthostatic hypotension with a hemoglobin of 4.9 and hematocrit of 17.6, AKI with a creatinine of 2.7 ( Baseline creatinine around 1.3-1.5 ),.  His stool for occult blood was positive.  Chest x-ray showed mild vascular congestion.  He underwent 2 units of PRBC transfusion and was referred to hospitalist service for management of anemia.  GI consulted for further recommendations.    Clinical Impression  Patient evaluation and assessment reveals patient performing functional mobility at an independent to modified independent level, not requiring any physical assistance for mobility or ambulation. Patient able to safely ambulate level surfaces without need for rest periods and does not demonstrate unsteadiness. Patient educated in asking nursing staff to assist with long-distance ambulation in hallways for supervision. Patient discharged from physical therapy to care of nursing for ambulation daily as tolerated for length of stay.    Follow Up Recommendations No PT follow up    Equipment Recommendations  None recommended by PT    Recommendations for Other Services       Precautions / Restrictions Precautions Precautions: None Restrictions Weight Bearing Restrictions: No       Mobility  Bed Mobility Overal bed mobility: Independent                  Transfers Overall transfer level: Independent                  Ambulation/Gait Ambulation/Gait assistance: Independent Gait Distance (Feet): 300 Feet Assistive device: None Gait Pattern/deviations: WFL(Within Functional Limits)        Stairs Stairs: Yes Stairs assistance: Independent Stair Management: No rails Number of Stairs: 1    Wheelchair Mobility    Modified Rankin (Stroke Patients Only)       Balance Overall balance assessment: Independent                                           Pertinent Vitals/Pain Pain Assessment: No/denies pain    Home Living Family/patient expects to be discharged to:: Private residence Living Arrangements: Children Available Help at Discharge: Family;Available PRN/intermittently Type of Home: House Home Access: Stairs to enter Entrance Stairs-Rails: None Entrance Stairs-Number of Steps: 1 (threshold) Home Layout: One level Home Equipment: Cane - single point      Prior Function Level of Independence: Independent         Comments: mows with riding mower, drives, son is a Administrator, but home nightly     Hand Dominance   Dominant Hand: Right    Extremity/Trunk Assessment   Upper Extremity Assessment Upper Extremity Assessment: Overall WFL for tasks assessed    Lower Extremity Assessment Lower Extremity Assessment: Overall WFL for tasks assessed    Cervical / Trunk Assessment Cervical / Trunk Assessment: Normal  Communication  Communication: No difficulties  Cognition Arousal/Alertness: Awake/alert Behavior During Therapy: WFL for tasks assessed/performed Overall Cognitive Status: Within Functional Limits for tasks assessed                                        General Comments      Exercises     Assessment/Plan    PT Assessment Patent does not need any further PT  services  PT Problem List         PT Treatment Interventions      PT Goals (Current goals can be found in the Care Plan section)  Acute Rehab PT Goals Patient Stated Goal: return home PT Goal Formulation: With patient Time For Goal Achievement: 01/08/20 Potential to Achieve Goals: Good    Frequency     Barriers to discharge        Co-evaluation               AM-PAC PT "6 Clicks" Mobility  Outcome Measure Help needed turning from your back to your side while in a flat bed without using bedrails?: None Help needed moving from lying on your back to sitting on the side of a flat bed without using bedrails?: None Help needed moving to and from a bed to a chair (including a wheelchair)?: None Help needed standing up from a chair using your arms (e.g., wheelchair or bedside chair)?: None Help needed to walk in hospital room?: None Help needed climbing 3-5 steps with a railing? : None 6 Click Score: 24    End of Session   Activity Tolerance: Patient tolerated treatment well Patient left: in chair;with call bell/phone within reach Nurse Communication: Mobility status PT Visit Diagnosis: Unsteadiness on feet (R26.81);Other abnormalities of gait and mobility (R26.89);Muscle weakness (generalized) (M62.81)    Time: 1031-1050 PT Time Calculation (min) (ACUTE ONLY): 19 min   Charges:   PT Evaluation $PT Eval Low Complexity: 1 Low PT Treatments $Gait Training: 8-22 mins        3:02 PM, 01/08/20 M. Sherlyn Lees, PT, DPT Physical Therapist- [place] Office Number: [phone]

## 2020-01-08 NOTE — Anesthesia Postprocedure Evaluation (Signed)
Anesthesia Post Note  Patient: Peter Lambert  Procedure(s) Performed: COLONOSCOPY WITH PROPOFOL (N/A )  Patient location during evaluation: PACU Anesthesia Type: General Level of consciousness: awake and alert and oriented Pain management: pain level controlled Vital Signs Assessment: post-procedure vital signs reviewed and stable Respiratory status: spontaneous breathing, nonlabored ventilation and respiratory function stable Cardiovascular status: blood pressure returned to baseline and stable Postop Assessment: no apparent nausea or vomiting Anesthetic complications: no   No complications documented.   Last Vitals:  Vitals:   01/08/20 1403 01/08/20 1415  BP: (!) 95/53 123/87  Pulse: 75 92  Resp: 17 (!) 25  Temp: 36.6 C   SpO2: (!) 89%     Last Pain:  Vitals:   01/08/20 1403  TempSrc:   PainSc: [name]

## 2020-01-08 NOTE — Anesthesia Procedure Notes (Signed)
Date/Time: 01/08/2020 1:48 PM Performed by: Orlie Dakin, CRNA Pre-anesthesia Checklist: Patient identified, Emergency Drugs available, Suction available and Patient being monitored Patient Re-evaluated:Patient Re-evaluated prior to induction Oxygen Delivery Method: Nasal cannula Induction Type: IV induction Placement Confirmation: positive ETCO2

## 2020-01-08 NOTE — Care Management Important Message (Signed)
Important Message  Patient Details  Name: Peter Lambert MRN: [id number] Date of Birth: Jun 03, 1945   Medicare Important Message Given:  Yes     Tommy Medal 01/08/2020, 2:25 PM

## 2020-01-08 NOTE — Anesthesia Preprocedure Evaluation (Signed)
Anesthesia Evaluation  Patient identified by MRN, date of birth, ID band Patient awake    Reviewed: Allergy & Precautions, NPO status , Patient's Chart, lab work & pertinent test results  History of Anesthesia Complications Negative for: history of anesthetic complications  Airway Mallampati: II  TM Distance: >3 FB Neck ROM: Full    Dental  (+) Edentulous Upper, Edentulous Lower   Pulmonary pneumonia (Aspiration?), COPD (uses oxygen as needed at home),  oxygen dependent, former smoker,           Cardiovascular Exercise Tolerance: Poor hypertension, Pt. on medications + CAD, + Past MI, + Cardiac Stents, + Peripheral Vascular Disease and +CHF (EF - 20%)  + Cardiac Defibrillator  Rhythm:Regular Rate:Normal  1. Left ventricular ejection fraction, by estimation, is 20%. The left  ventricle has severely decreased function. The left ventricle demonstrates  global hypokinesis, more prominent inferior/posterior walls. There is  moderate left ventricular  hypertrophy. Left ventricular diastolic parameters are consistent with  Grade II diastolic dysfunction (pseudonormalization). Slow flow and stasis  noted by Definity contrast in LV particularly at apex. Although no formed  thrombus, there is significant  substrate for thrombus formation.  2. RV-RA gradient 25 mmHg. Right ventricular systolic function is  severely reduced. The right ventricular size is normal.  3. The mitral valve is grossly normal, mildly thickened. Mild mitral  valve regurgitation.  4. The aortic valve is tricuspid. Aortic valve regurgitation is not  visualized. Mild aortic valve sclerosis is present, with no evidence of  aortic valve stenosis.  5. Unable to estimate CVP.   04-Jan-2020 18:40:57 [place] System-AP-ER ROUTINE RECORD A-V dual-paced rhythm with some inhibition No further analysis attempted due to paced rhythm Since last tracing pacer  spikes now visiible Otherwise no significant change Confirmed by Daleen Bo [id number]) on 01/04/2020 6:45:15 PM    Neuro/Psych negative psych ROS   GI/Hepatic Neg liver ROS, hiatal hernia,   Endo/Other  diabetes, Well Controlled  Renal/GU Renal InsufficiencyRenal disease     Musculoskeletal   Abdominal   Peds  Hematology  (+) anemia ,   Anesthesia Other Findings Peripheral arterial disease ([place]) Coronary artery disease s/p DES to LAD in 2010, s/p PCI to proximal LAD stenosis with DES this hospitalization Essential hypertension Hyperlipidemia Type 2 diabetes mellitus ([place]) Tobacco use Acute respiratory failure with hypoxemia (HCC) COPD (chronic obstructive pulmonary disease) ([place]) Aspiration pneumonia ([place]), bilateral Demand ischemia of myocardium (HCC) Atrial flutter (HCC) Acute on chronic diastolic heart failure (HCC) Lobar pneumonia (HCC) Acute on chronic respiratory failure with hypoxia (HCC) Cardiac arrest with ventricular fibrillation (HCC) AKI (acute kidney injury) ([place]) Hypokalemia Hyperglycemia Hypoalbuminemia Elevated brain natriuretic peptide (BNP) level Lactic acidosis Leukocytosis Proteinuria Hypomagnesemia NSTEMI (non-ST elevated myocardial infarction) ([place]) CKD (chronic kidney disease), stage III b PVC's (premature ventricular contractions) NSVT (nonsustained ventricular tachycardia) (HCC) Thrombocytopenia (HCC) Symptomatic anemia Severe sepsis with septic shock ([place]) suspected on admission, source: ? aspiration Acute metabolic encephalopathy Acute systolic CHF (congestive heart failure) (HCC) Anticoagulated on heparin History of noncompliance with medical treatment ICD (implantable cardioverter-defibrillator) in place GI bleed Orthostatic hypotension Acute blood loss anemia Generalized weakness Hyponatremia Hyperkalemia CAD (coronary artery disease) HFrEF (heart failure with reduced ejection fraction) (HCC) Accidental fall Prolonged  QT interval Atrial fibrillation, chronic (HCC)    Reproductive/Obstetrics                             Anesthesia Physical  Anesthesia Plan  ASA: IV  Anesthesia Plan: General   Post-op Pain Management:    Induction: Intravenous  PONV Risk Score and Plan: TIVA  Airway Management Planned: Nasal Cannula and Natural Airway  Additional Equipment:   Intra-op Plan:   Post-operative Plan:   Informed Consent: I have reviewed the patients History and Physical, chart, labs and discussed the procedure including the risks, benefits and alternatives for the proposed anesthesia with the patient or authorized representative who has indicated his/her understanding and acceptance.     Dental advisory given  Plan Discussed with: CRNA and Surgeon  Anesthesia Plan Comments:         Anesthesia Quick Evaluation

## 2020-01-13 ENCOUNTER — Telehealth: Payer: Self-pay | Admitting: Gastroenterology

## 2020-01-13 NOTE — Telephone Encounter (Signed)
Forwarded to [employer] and Darius Bump.

## 2020-01-13 NOTE — Telephone Encounter (Signed)
Patient seen in the hospital recently, previously unassigned. Now patient of Castaneda.   He needs hospital follow up for profound anemia. Poor prep, repeat colonoscopy in 3 months.  Per Rehman, outpatient UGI series for paraesophageal hernia.

## 2020-01-14 ENCOUNTER — Encounter (HOSPITAL_COMMUNITY): Payer: Self-pay | Admitting: Internal Medicine

## 2020-01-14 DIAGNOSIS — D649 Anemia, unspecified: Secondary | ICD-10-CM | POA: Diagnosis not present

## 2020-01-14 DIAGNOSIS — I4891 Unspecified atrial fibrillation: Secondary | ICD-10-CM | POA: Diagnosis not present

## 2020-01-14 DIAGNOSIS — K922 Gastrointestinal hemorrhage, unspecified: Secondary | ICD-10-CM | POA: Diagnosis not present

## 2020-01-14 DIAGNOSIS — I509 Heart failure, unspecified: Secondary | ICD-10-CM | POA: Diagnosis not present

## 2020-01-14 DIAGNOSIS — E1129 Type 2 diabetes mellitus with other diabetic kidney complication: Secondary | ICD-10-CM | POA: Diagnosis not present

## 2020-01-14 DIAGNOSIS — Z6824 Body mass index (BMI) 24.0-24.9, adult: Secondary | ICD-10-CM | POA: Diagnosis not present

## 2020-01-14 DIAGNOSIS — E538 Deficiency of other specified B group vitamins: Secondary | ICD-10-CM | POA: Diagnosis not present

## 2020-01-19 ENCOUNTER — Other Ambulatory Visit: Payer: Self-pay

## 2020-01-19 ENCOUNTER — Telehealth (INDEPENDENT_AMBULATORY_CARE_PROVIDER_SITE_OTHER): Payer: Self-pay

## 2020-01-19 ENCOUNTER — Encounter (INDEPENDENT_AMBULATORY_CARE_PROVIDER_SITE_OTHER): Payer: Self-pay | Admitting: Internal Medicine

## 2020-01-19 ENCOUNTER — Ambulatory Visit (INDEPENDENT_AMBULATORY_CARE_PROVIDER_SITE_OTHER): Payer: PPO | Admitting: Internal Medicine

## 2020-01-19 VITALS — BP 114/52 | HR 60 | Temp 98.4°F | Ht 68.0 in | Wt 161.1 lb

## 2020-01-19 DIAGNOSIS — D509 Iron deficiency anemia, unspecified: Secondary | ICD-10-CM | POA: Insufficient documentation

## 2020-01-19 DIAGNOSIS — D500 Iron deficiency anemia secondary to blood loss (chronic): Secondary | ICD-10-CM | POA: Diagnosis not present

## 2020-01-19 DIAGNOSIS — K449 Diaphragmatic hernia without obstruction or gangrene: Secondary | ICD-10-CM

## 2020-01-19 DIAGNOSIS — K5900 Constipation, unspecified: Secondary | ICD-10-CM | POA: Insufficient documentation

## 2020-01-19 MED ORDER — LUBIPROSTONE 8 MCG PO CAPS: 8.0000 ug | ORAL_CAPSULE | Freq: Two times a day (BID) | ORAL | 5 refills | Status: DC

## 2020-01-19 MED ORDER — FERROUS SULFATE 325 (65 FE) MG PO TABS: 325.0000 mg | ORAL_TABLET | Freq: Every day | ORAL | 0 refills | Status: DC

## 2020-01-19 NOTE — Patient Instructions (Addendum)
Hemoccult x1. Physician will call with results of upper GI series when completed. Will request copy of recent blood work from Dr. Nolon Rod office.

## 2020-01-19 NOTE — Telephone Encounter (Signed)
Peter Lambert, CMA  

## 2020-01-19 NOTE — Progress Notes (Signed)
Presenting complaint;  Follow for iron deficiency anemia.  Database and subjective:  Patient is 74 year old African-American male with history of atrial fibrillation on Eliquis chronic kidney disease coronary artery disease as well as COPD who was admitted to [facility] on 01/04/2020.  He was noted to have hemoglobin of 4.9 g.  Stool guaiac was positive.  He denied history of melena or rectal bleeding.  He received 3 units of PRBCs.  He underwent esophagogastroduodenoscopy by me on 01/06/2020 which revealed extrinsic compression in mid esophagus possibly due to dilated atrium and moderate size paraesophageal hernia.  He had a tiny duodenal AV malformation without bleeding.  Therefore no bleeding lesion was identified.  He underwent colonoscopy by Dr. Abbey Chatters on January 08, 2020 which revealed scattered diverticula throughout the colon and internal hemorrhoids but no bleeding lesion was identified.  His preparation was suboptimal and Dr. Abbey Chatters recommended repeat exam in 3 months.  Patient has no complaints.  He states had blood work by Dr. Gerarda Fraction last week Thursday but does not know the result.  He is back on his Eliquis and low-dose aspirin.  He does not take any other OTC NSAIDs.  He denies nausea vomiting heartburn dysphagia abdominal pain melena or rectal bleeding.  He is prone to constipation.  He has tried [place] but it does not work.  He is drinking prune juice every day and he also has to take Ex-Lax or twice a week.  He notices occasional mild postural lightheadedness.  He denies chest pain or shortness of breath.  He says his appetite is normal.  He is trying to eat healthy.  He states he eats fruits and vegetables.  He eats meat no more than once or twice a week.  His weight is down by 10 pounds since 12/15/2019.  He is not trying to lose weight.  Current Medications: Outpatient Encounter Medications as of 01/19/2020  Medication Sig  . acetaminophen (TYLENOL) 325 MG tablet Take 2 tablets (650 mg  total) by mouth every 6 (six) hours as needed for mild pain, fever or headache (or Fever >/= 101).  Marland Kitchen albuterol (VENTOLIN HFA) 108 (90 Base) MCG/ACT inhaler Inhale 2 puffs into the lungs every 4 (four) hours as needed for wheezing or shortness of breath.  Marland Kitchen apixaban (ELIQUIS) 5 MG TABS tablet Take 1 tablet (5 mg total) by mouth 2 (two) times daily.  Marland Kitchen aspirin EC 81 MG tablet Take 1 tablet (81 mg total) by mouth daily with breakfast.  . atorvastatin (LIPITOR) 80 MG tablet Take 1 tablet (80 mg total) by mouth daily.  . carvedilol (COREG) 3.125 MG tablet Take 1 tablet (3.125 mg total) by mouth 2 (two) times daily with a meal.  . colchicine 0.6 MG tablet Take 0.6 mg by mouth daily.   . fluocinonide cream (LIDEX) 1.61 % Apply 1 application topically 2 (two) times daily.  Marland Kitchen guaiFENesin (MUCINEX) 600 MG 12 hr tablet Take 600 mg by mouth.  . isosorbide mononitrate (IMDUR) 30 MG 24 hr tablet Take 30 mg by mouth daily.  Marland Kitchen losartan (COZAAR) 25 MG tablet Take 1 tablet (25 mg total) by mouth daily.  . nitroGLYCERIN (NITROSTAT) 0.4 MG SL tablet PLACE 1 TABLET UNDER THE TONGUE EVERY 5 MINUTES AS NEEDED FOR CHEST PAIN (Patient taking differently: Place 0.4 mg under the tongue every 5 (five) minutes as needed. PLACE 1 TABLET UNDER THE TONGUE EVERY 5 MINUTES AS NEEDED FOR CHEST PAIN)  . Sennosides (EX-LAX PO) Take by mouth as needed.  Marland Kitchen spironolactone (  ALDACTONE) 50 MG tablet Take 50 mg by mouth daily.  Marland Kitchen amiodarone (PACERONE) 200 MG tablet Take 1 tablet (200 mg total) by mouth daily. (Patient not taking: Reported on 01/19/2020)  . polyethylene glycol (MIRALAX / GLYCOLAX) 17 g packet Take 17 g by mouth daily. For bowel (Patient not taking: Reported on 01/19/2020)  . vitamin B-12 1000 MCG tablet Take 1 tablet (1,000 mcg total) by mouth daily. (Patient not taking: Reported on 01/19/2020)  . [DISCONTINUED] potassium chloride (KLOR-CON) 20 MEQ packet TAKE 1 TABLET BY MOUTH EVERY DAY   No facility-administered encounter  medications on file as of 01/19/2020.     Objective: Blood pressure (!) 114/52, pulse 60, temperature 98.4 F (36.9 C), temperature source Oral, height _0  (1.727 m), weight 161 lb 1.9 oz (73.1 kg). Patient is alert and in no acute distress. Conjunctiva is pale. Sclera is nonicteric Oropharyngeal mucosa is normal. Patient is edentulous.  He states he has dentures that he uses at mealtime. No neck masses or thyromegaly noted. Cardiac exam with irregular rhythm normal S1 and S2. No murmur or gallop noted. Lungs are clear to auscultation. Abdomen is full.  He has scarring upper abdomen.  On palpation abdomen is soft and nontender without organomegaly or masses. No LE edema or clubbing noted.  Labs/studies Results:  CBC Latest Ref Rng & Units 01/08/2020 01/07/2020 01/05/2020  WBC 4.0 - 10.5 K/uL 10.2 10.6(H) -  Hemoglobin 13.0 - 17.0 g/dL 8.4(L) 8.4(L) 8.3(L)  Hematocrit 39 - 52 % 28.4(L) 27.7(L) 27.9(L)  Platelets 150 - 400 K/uL 366 350 -    CMP Latest Ref Rng & Units 01/08/2020 01/07/2020 01/06/2020  Glucose 70 - 99 mg/dL 107(H) 100(H) 117(H)  BUN 8 - 23 mg/dL 18 18 24(H)  Creatinine 0.61 - 1.24 mg/dL 1.42(H) 1.32(H) 1.45(H)  Sodium 135 - 145 mmol/L 133(L) 131(L) 133(L)  Potassium 3.5 - 5.1 mmol/L 4.6 4.6 4.7  Chloride 98 - 111 mmol/L 99 98 101  CO2 22 - 32 mmol/L _1 Calcium 8.9 - 10.3 mg/dL 8.5(L) 8.3(L) 8.5(L)  Total Protein 6.5 - 8.1 g/dL - - -  Total Bilirubin 0.3 - 1.2 mg/dL - - -  Alkaline Phos 38 - 126 U/L - - -  AST 15 - 41 U/L - - -  ALT 0 - 44 U/L - - -    Hepatic Function Latest Ref Rng & Units 01/05/2020 01/04/2020 10/01/2019  Total Protein 6.5 - 8.1 g/dL 6.9 7.2 7.0  Albumin 3.5 - 5.0 g/dL 3.1(L) 3.2(L) 4.3  AST 15 - 41 U/L _2 ALT 0 - 44 U/L 31 37 16  Alk Phosphatase 38 - 126 U/L 71 75 86  Total Bilirubin 0.3 - 1.2 mg/dL 0.4 0.3 0.2  Bilirubin, Direct 0.00 - 0.40 mg/dL - - 0.10     Assessment:  #1.  Iron deficiency anemia secondary to chronic  GI blood loss.  He received 3 units of PRBCs during recent hospitalization.  Predischarge hemoglobin was 8.4 g.  Admission hemoglobin was 4.9 g.  He underwent EGD and colonoscopy which did not reveal source of GI blood loss.  Colonoscopy was suboptimal because of prep.  He did have tiny AV malformation in duodenum without stigmata of bleeding.  He there could have more of these lesions in small bowel.  #2.  Paraesophageal hernia.  This was discovered on recent EGD.  Patient does not have any symptoms.  Ideally these lesions should be repaired.  However he is high  risk.  #3.  Chronic constipation.  Dietary measures, prune juice are not helping and he is using Ex-Lax.  He needs to be on medication on schedule in order to prevent constipation.  Plan:  Hemoccult x1. Request copy of recent blood work from Dr. Nolon Rod office. Barium swallow and upper GI series. Ferrous sulfate 325 mg p.o. daily with breakfast. Amitiza/lubiprostone 8 mcg p.o. twice daily. Further recommendations to follow.

## 2020-01-19 NOTE — Telephone Encounter (Signed)
done

## 2020-01-20 ENCOUNTER — Other Ambulatory Visit (INDEPENDENT_AMBULATORY_CARE_PROVIDER_SITE_OTHER): Payer: Self-pay | Admitting: *Deleted

## 2020-01-20 DIAGNOSIS — D500 Iron deficiency anemia secondary to blood loss (chronic): Secondary | ICD-10-CM

## 2020-01-26 DIAGNOSIS — I129 Hypertensive chronic kidney disease with stage 1 through stage 4 chronic kidney disease, or unspecified chronic kidney disease: Secondary | ICD-10-CM | POA: Diagnosis not present

## 2020-01-26 DIAGNOSIS — E7849 Other hyperlipidemia: Secondary | ICD-10-CM | POA: Diagnosis not present

## 2020-01-26 DIAGNOSIS — E1122 Type 2 diabetes mellitus with diabetic chronic kidney disease: Secondary | ICD-10-CM | POA: Diagnosis not present

## 2020-01-26 DIAGNOSIS — N183 Chronic kidney disease, stage 3 unspecified: Secondary | ICD-10-CM | POA: Diagnosis not present

## 2020-01-26 DIAGNOSIS — J449 Chronic obstructive pulmonary disease, unspecified: Secondary | ICD-10-CM | POA: Diagnosis not present

## 2020-02-01 DIAGNOSIS — G20 Parkinson's disease: Secondary | ICD-10-CM | POA: Diagnosis not present

## 2020-02-01 DIAGNOSIS — I4891 Unspecified atrial fibrillation: Secondary | ICD-10-CM | POA: Diagnosis not present

## 2020-02-01 DIAGNOSIS — R2689 Other abnormalities of gait and mobility: Secondary | ICD-10-CM | POA: Diagnosis not present

## 2020-02-01 DIAGNOSIS — G959 Disease of spinal cord, unspecified: Secondary | ICD-10-CM | POA: Diagnosis not present

## 2020-02-01 DIAGNOSIS — G40109 Localization-related (focal) (partial) symptomatic epilepsy and epileptic syndromes with simple partial seizures, not intractable, without status epilepticus: Secondary | ICD-10-CM | POA: Diagnosis not present

## 2020-02-01 DIAGNOSIS — R296 Repeated falls: Secondary | ICD-10-CM | POA: Diagnosis not present

## 2020-02-03 ENCOUNTER — Ambulatory Visit (HOSPITAL_COMMUNITY): Payer: PPO

## 2020-02-03 ENCOUNTER — Other Ambulatory Visit: Payer: Self-pay

## 2020-02-03 ENCOUNTER — Ambulatory Visit (HOSPITAL_COMMUNITY)
Admission: RE | Admit: 2020-02-03 | Discharge: 2020-02-03 | Disposition: A | Discharge status: Home / Self Care | Payer: PPO | Source: Ambulatory Visit | Attending: Internal Medicine | Admitting: Internal Medicine

## 2020-02-03 DIAGNOSIS — K449 Diaphragmatic hernia without obstruction or gangrene: Secondary | ICD-10-CM | POA: Diagnosis not present

## 2020-02-03 DIAGNOSIS — D500 Iron deficiency anemia secondary to blood loss (chronic): Secondary | ICD-10-CM | POA: Diagnosis not present

## 2020-02-03 DIAGNOSIS — K219 Gastro-esophageal reflux disease without esophagitis: Secondary | ICD-10-CM | POA: Diagnosis not present

## 2020-02-03 LAB — HEMOGLOBIN AND HEMATOCRIT, BLOOD
HCT: 22.4 % — ABNORMAL LOW (ref 38.5–50.0)
Hemoglobin: 7 g/dL — ABNORMAL LOW (ref 13.2–17.1)

## 2020-02-03 IMAGING — RF DG UGI W/ HIGH DENSITY W/O KUB
12 of 15 series · 15 of 24 positions shown · non-contrast
Comparison: CT angio chest [DATE]

CLINICAL DATA: Paraesophageal hernia

EXAM:
UPPER GI SERIES WITH KUB
TECHNIQUE: After obtaining a scout radiograph a routine upper GI series was
performed using high density barium as well as effervescent granules
and a 12.5 mm diameter barium tablet
FLUOROSCOPY TIME:  Fluoroscopy Time:  1 minutes 6 seconds
Radiation Exposure Index (if provided by the fluoroscopic device):
59.7 mGy
Number of Acquired Spot Images: 4 plus multiple fluoroscopic screen
captures

[Series 1: t ap supine · 0.15mm/px · 1 of 1 slices shown]
[im 1/1]
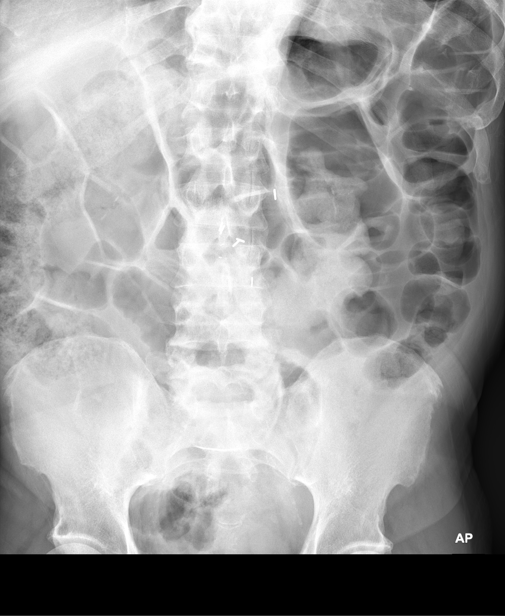

[Series 5: cp_standard · 0.26mm/px · 1 of 1 slices shown (1 of 11)]
[im 1/1]
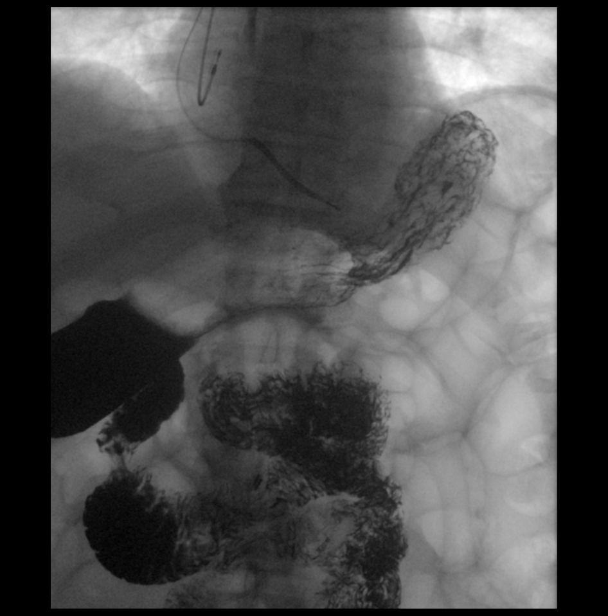

[Series 8: cp_standard · 0.18mm/px · 1 of 1 slices shown (2 of 11)]
[im 1/1]
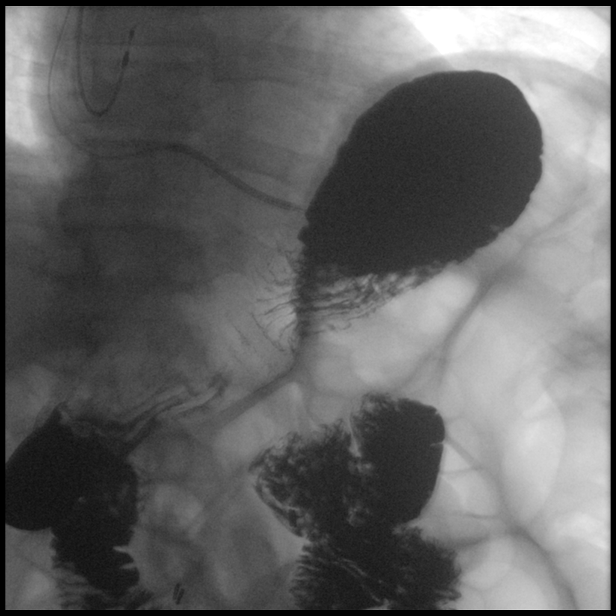

[Series 10: cp_standard · 0.18mm/px · 1 of 1 slices shown (3 of 11)]
[im 1/1]
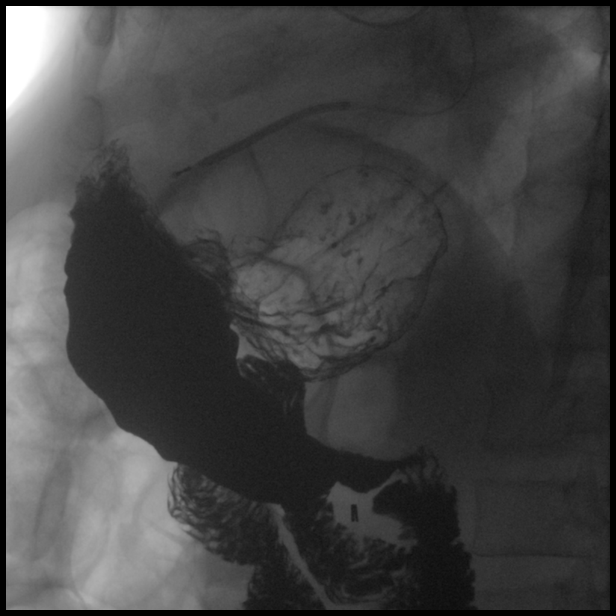

[Series 12: cp_standard · 0.28mm/px · 1 of 178 frames shown (4 of 11)]
[frame 90/178]
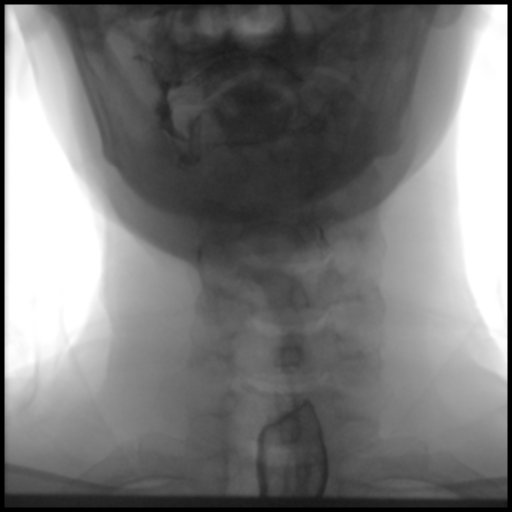

[Series 13: cp_standard · 0.25mm/px · 1 of 84 frames shown (5 of 11)]
[frame 1/84]
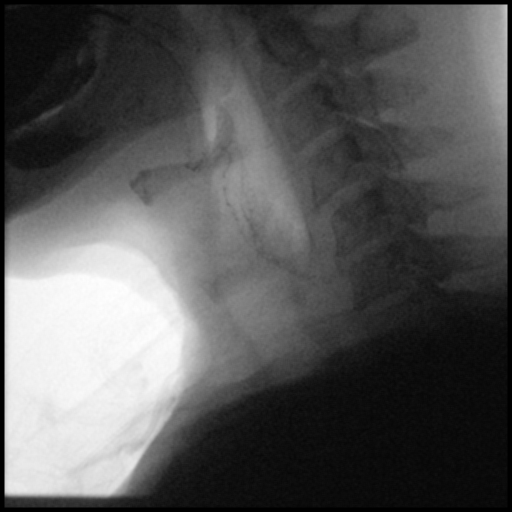

[Series 14: cp_standard · 0.18mm/px · 2 acquisitions, 2 frames shown (6 of 11)]
[im 1/2]
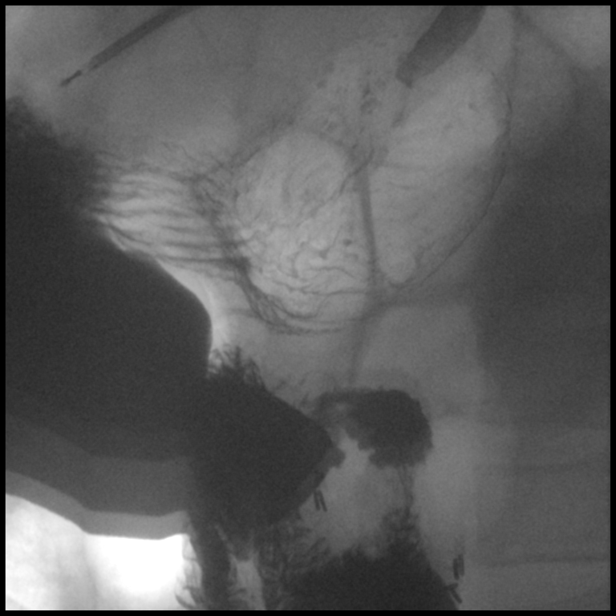
[im 1/2]
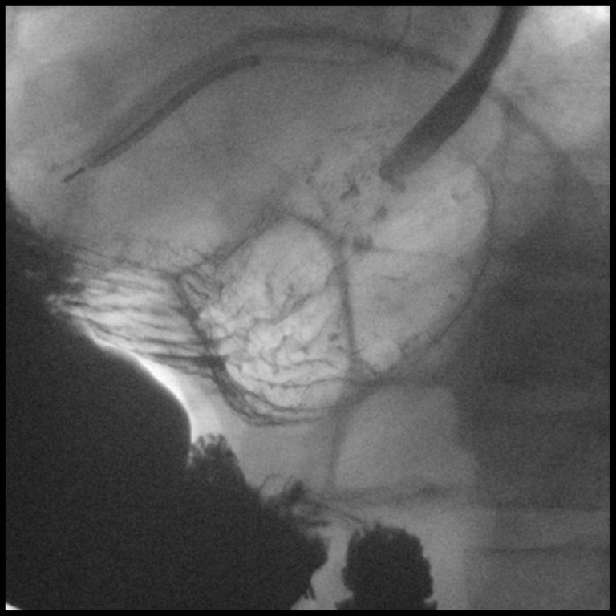

[Series 16: cp_standard · 0.18mm/px · 1 of 120 frames shown (7 of 11)]
[frame 10/120]
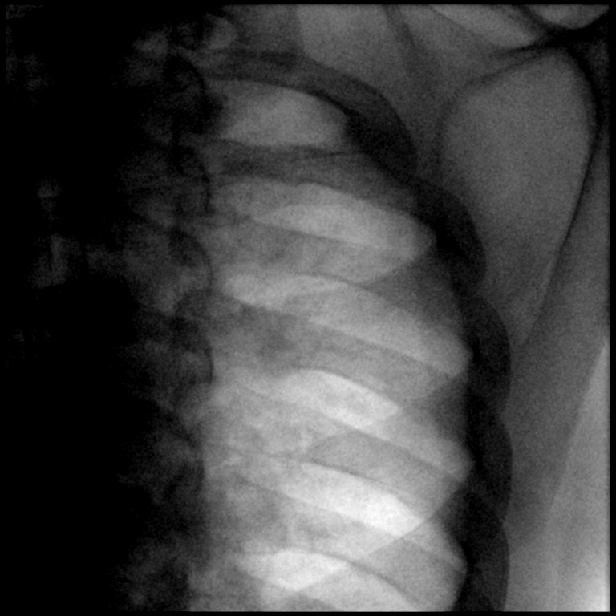

[Series 17: cp_standard · 0.18mm/px · 2 of 31 frames shown (8 of 11)]
[frame 5/31]
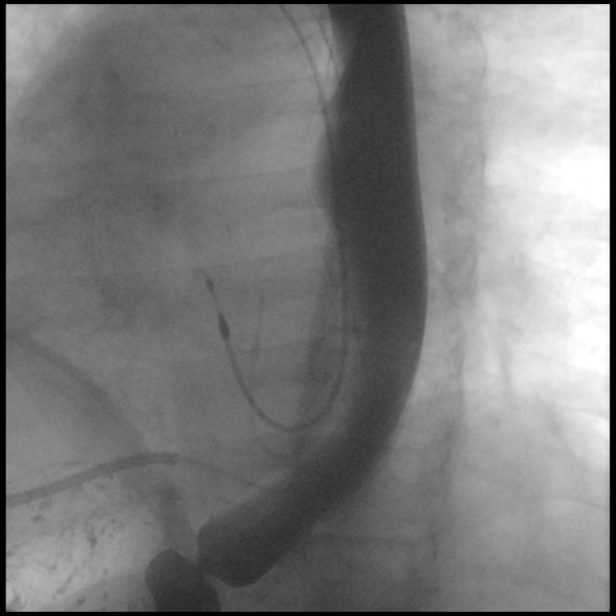
[frame 16/31]
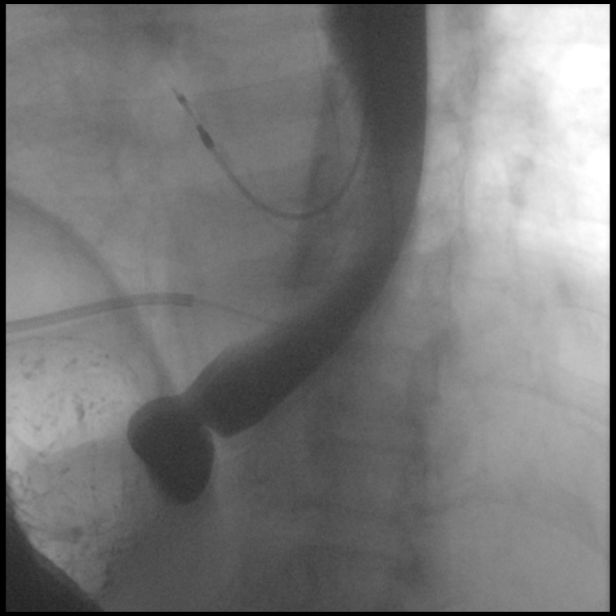

[Series 18: cp_standard · 0.18mm/px · 1 of 108 frames shown (9 of 11)]
[frame 92/108]
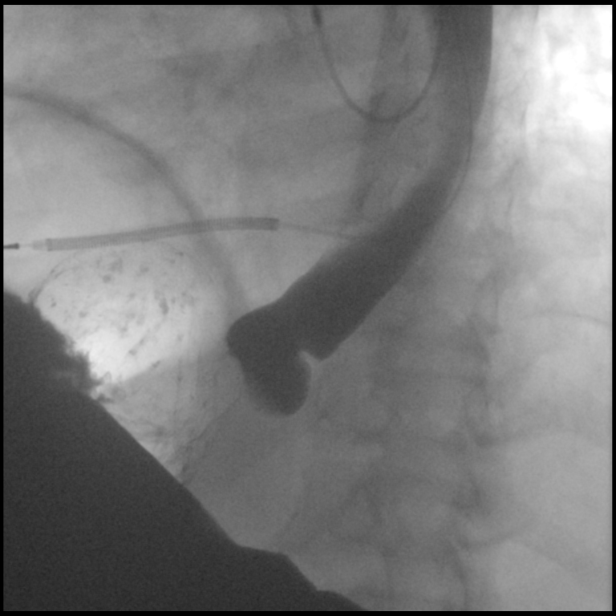

[Series 19: cp_standard · 0.18mm/px · 2 of 10 frames shown (10 of 11)]
[frame 6/10]
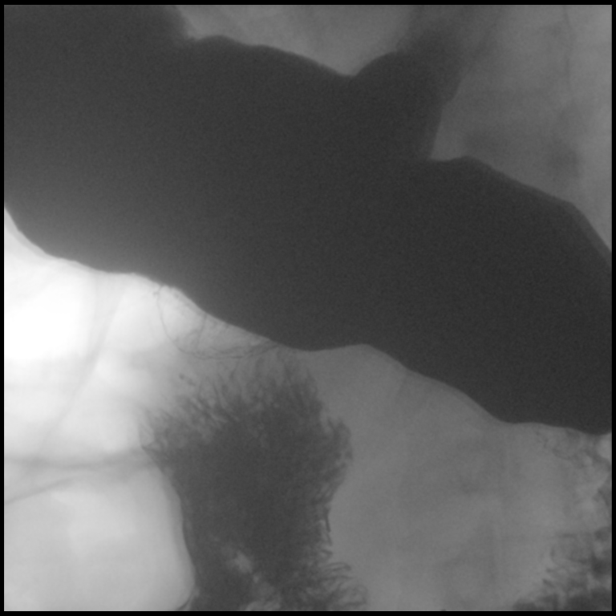
[frame 9/10]
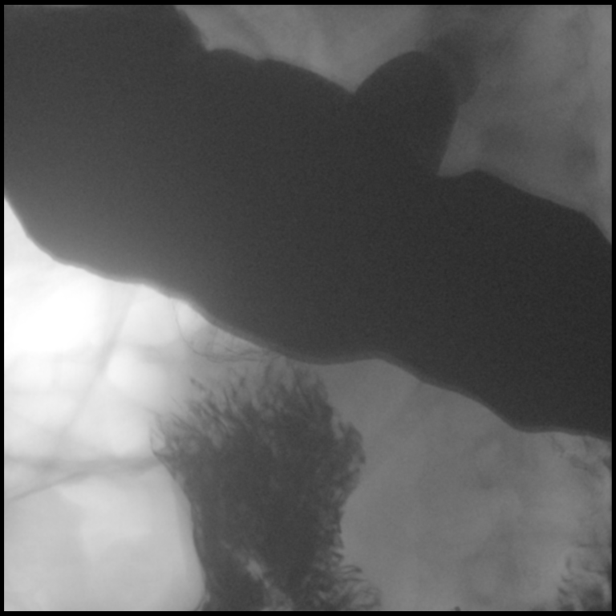

[Series 23: cp_standard · 0.19mm/px · 1 of 1 slices shown (11 of 11)]
[im 1/1]
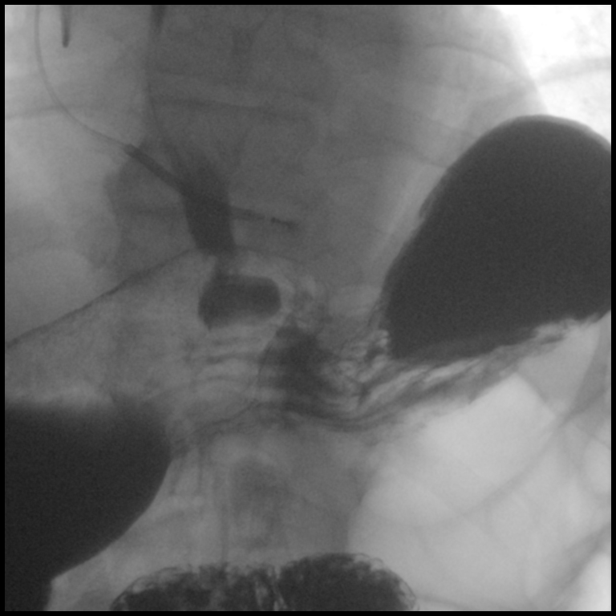

[15 of 24 positions shown; findings below may reference images not displayed]

FINDINGS: Normal esophageal distention without mass or stricture.

Age-related esophageal dysmotility.

Tiny hiatal hernia and minimal gastroesophageal reflux noted.

No definite paraesophageal hernia identified.

Targeted rapid sequence imaging of the cervical esophagus and
hypopharynx demonstrated normal motion without laryngeal penetration
or aspiration.

12.5 mm diameter barium tablet passed from oral cavity to stomach
without obstruction.

Stomach distends normally with normal rugal folds.

No gastric mass, ulceration, or outlet obstruction.

Duodenal bulb and sweep normal appearance.

Ligament of Treitz normal position.

Visualize jejunal loops normal.
IMPRESSION: Tiny hiatal hernia with minimal gastroesophageal reflux.

Otherwise normal exam.

## 2020-02-04 ENCOUNTER — Other Ambulatory Visit: Payer: Self-pay

## 2020-02-04 ENCOUNTER — Encounter (HOSPITAL_COMMUNITY): Payer: Self-pay | Admitting: Emergency Medicine

## 2020-02-04 ENCOUNTER — Emergency Department (HOSPITAL_COMMUNITY)
Admission: EM | Admit: 2020-02-04 | Discharge: 2020-02-04 | Disposition: A | Discharge status: Home / Self Care | Payer: PPO | Attending: Emergency Medicine | Admitting: Emergency Medicine

## 2020-02-04 ENCOUNTER — Telehealth (INDEPENDENT_AMBULATORY_CARE_PROVIDER_SITE_OTHER): Payer: Self-pay | Admitting: *Deleted

## 2020-02-04 DIAGNOSIS — I130 Hypertensive heart and chronic kidney disease with heart failure and stage 1 through stage 4 chronic kidney disease, or unspecified chronic kidney disease: Secondary | ICD-10-CM | POA: Diagnosis not present

## 2020-02-04 DIAGNOSIS — I5033 Acute on chronic diastolic (congestive) heart failure: Secondary | ICD-10-CM | POA: Diagnosis not present

## 2020-02-04 DIAGNOSIS — I2510 Atherosclerotic heart disease of native coronary artery without angina pectoris: Secondary | ICD-10-CM | POA: Diagnosis not present

## 2020-02-04 DIAGNOSIS — Z7901 Long term (current) use of anticoagulants: Secondary | ICD-10-CM | POA: Insufficient documentation

## 2020-02-04 DIAGNOSIS — Z955 Presence of coronary angioplasty implant and graft: Secondary | ICD-10-CM | POA: Insufficient documentation

## 2020-02-04 DIAGNOSIS — E119 Type 2 diabetes mellitus without complications: Secondary | ICD-10-CM | POA: Diagnosis not present

## 2020-02-04 DIAGNOSIS — Z95810 Presence of automatic (implantable) cardiac defibrillator: Secondary | ICD-10-CM | POA: Insufficient documentation

## 2020-02-04 DIAGNOSIS — J449 Chronic obstructive pulmonary disease, unspecified: Secondary | ICD-10-CM | POA: Diagnosis not present

## 2020-02-04 DIAGNOSIS — Z79899 Other long term (current) drug therapy: Secondary | ICD-10-CM | POA: Insufficient documentation

## 2020-02-04 DIAGNOSIS — Z87891 Personal history of nicotine dependence: Secondary | ICD-10-CM | POA: Insufficient documentation

## 2020-02-04 DIAGNOSIS — Z7982 Long term (current) use of aspirin: Secondary | ICD-10-CM | POA: Insufficient documentation

## 2020-02-04 DIAGNOSIS — N1832 Chronic kidney disease, stage 3b: Secondary | ICD-10-CM | POA: Diagnosis not present

## 2020-02-04 DIAGNOSIS — D649 Anemia, unspecified: Secondary | ICD-10-CM | POA: Diagnosis not present

## 2020-02-04 LAB — CBC WITH DIFFERENTIAL/PLATELET
Abs Immature Granulocytes: 0.13 10*3/uL — ABNORMAL HIGH (ref 0.00–0.07)
Basophils Absolute: 0.1 10*3/uL (ref 0.0–0.1)
Basophils Relative: 1 %
Eosinophils Absolute: 0.3 10*3/uL (ref 0.0–0.5)
Eosinophils Relative: 4 %
HCT: 24.2 % — ABNORMAL LOW (ref 39.0–52.0)
Hemoglobin: 7 g/dL — ABNORMAL LOW (ref 13.0–17.0)
Immature Granulocytes: 2 %
Lymphocytes Relative: 17 %
Lymphs Abs: 1.3 10*3/uL (ref 0.7–4.0)
MCH: 26.7 pg (ref 26.0–34.0)
MCHC: 28.9 g/dL — ABNORMAL LOW (ref 30.0–36.0)
MCV: 92.4 fL (ref 80.0–100.0)
Monocytes Absolute: 0.9 10*3/uL (ref 0.1–1.0)
Monocytes Relative: 11 %
Neutro Abs: 5 10*3/uL (ref 1.7–7.7)
Neutrophils Relative %: 65 %
Platelets: 269 10*3/uL (ref 150–400)
RBC: 2.62 MIL/uL — ABNORMAL LOW (ref 4.22–5.81)
RDW: 24.4 % — ABNORMAL HIGH (ref 11.5–15.5)
WBC: 7.7 10*3/uL (ref 4.0–10.5)
nRBC: 0 % (ref 0.0–0.2)

## 2020-02-04 LAB — COMPREHENSIVE METABOLIC PANEL
ALT: 18 U/L (ref 0–44)
AST: 16 U/L (ref 15–41)
Albumin: 3.5 g/dL (ref 3.5–5.0)
Alkaline Phosphatase: 76 U/L (ref 38–126)
Anion gap: 7 (ref 5–15)
BUN: 21 mg/dL (ref 8–23)
CO2: 23 mmol/L (ref 22–32)
Calcium: 8.5 mg/dL — ABNORMAL LOW (ref 8.9–10.3)
Chloride: 105 mmol/L (ref 98–111)
Creatinine, Ser: 1.71 mg/dL — ABNORMAL HIGH (ref 0.61–1.24)
GFR, Estimated: 41 mL/min — ABNORMAL LOW (ref >60)
Glucose, Bld: 147 mg/dL — ABNORMAL HIGH (ref 70–99)
Potassium: 4.4 mmol/L (ref 3.5–5.1)
Sodium: 135 mmol/L (ref 135–145)
Total Bilirubin: 0.5 mg/dL (ref 0.3–1.2)
Total Protein: 6.9 g/dL (ref 6.5–8.1)

## 2020-02-04 LAB — HEMOGLOBIN AND HEMATOCRIT, BLOOD
HCT: 26.9 % — ABNORMAL LOW (ref 39.0–52.0)
Hemoglobin: 8.2 g/dL — ABNORMAL LOW (ref 13.0–17.0)

## 2020-02-04 LAB — PREPARE RBC (CROSSMATCH)

## 2020-02-04 MED ORDER — OMEPRAZOLE 20 MG PO CPDR: 20.0000 mg | DELAYED_RELEASE_CAPSULE | Freq: Every day | ORAL | 0 refills | Status: DC

## 2020-02-04 MED ORDER — SODIUM CHLORIDE 0.9% IV SOLUTION: Freq: Once | INTRAVENOUS | Status: AC

## 2020-02-04 NOTE — ED Notes (Signed)
Pt is alert and vitals are WNL. No needs expressed at this time.

## 2020-02-04 NOTE — Telephone Encounter (Signed)
Per Dr.Castaneda's request the ED at [facility] was called and made aware that the patient was being sent for further evaluation and possible blood transfusion.

## 2020-02-04 NOTE — ED Triage Notes (Signed)
Pt states his pcp called today and told him that his hemoglobin was low and to come to ER. Denies any pain.

## 2020-02-04 NOTE — Discharge Instructions (Addendum)
STOP your Eliquis and Aspirin for now. Follow up with your GI team next week, call tomorrow to schedule an appointment.

## 2020-02-04 NOTE — Telephone Encounter (Signed)
Patient was called and given Dr.Castaneda's recommendation. He ask that we let the emergency know that he is coming.

## 2020-02-04 NOTE — Telephone Encounter (Signed)
Rec'd a call from [facility]/Gaitan. He reports that the patient 's Hemoglobin is 7.0 . Three weeks ago it was 8.4 .  I called the patient and he states that he feel fine. Denies any dizziness , shortness of breathe, weakness. When ask about a change in his stool color - he stated that he had not paid any attention to it.  Patient was advised that Dr.Rehman was not here but I was going to make [place] aware. Any recommendations we would call him back. Patient was advised that if he had any of the above symptoms to go to the emergency room for further evaluation.

## 2020-02-04 NOTE — ED Provider Notes (Signed)
[facility] EMERGENCY DEPARTMENT Provider Note   CSN: [id number] Arrival date & time: 02/04/20  1402     History Chief Complaint  Patient presents with  . Abnormal Lab    Peter Lambert is a 74 y.o. male.  74 year old male with past complex medical history as listed below including history of A. fib (on Eliquis), CAD, chronic GI bleed, presents at the recommendation of his GI provider for anemia.  Patient states that he is feeling fine and has been running errands all day however was told he needs to come to the emergency room for a transfusion today.  Patient denies feeling weak, dizzy, lightheaded, short of breath or having chest pain.  He denies any changes in his stools, states that he does not look and is feeling constipated.  Patient is on Eliquis, does take a baby aspirin daily.  Has had prior transfusions.  No other complaints or concerns.        Past Medical History:  Diagnosis Date  . Coronary artery disease    a. s/p NSTEMI in 2010 with DES to proximal LAD with D1 jailed and angioplasty alone to ostium  . Hyperlipidemia   . Hypertension   . Hypertension   . Peripheral arterial disease ([place])   . Tobacco abuse   . Type 2 diabetes mellitus [facility])     Patient Active Problem List   Diagnosis Date Noted  . IDA (iron deficiency anemia) 01/19/2020  . Constipation 01/19/2020  . Paraesophageal hernia   . Accidental fall 01/05/2020  . Prolonged QT interval 01/05/2020  . Atrial fibrillation, chronic ([place]) 01/05/2020  . GI bleed 01/04/2020  . Orthostatic hypotension 01/04/2020  . Acute blood loss anemia 01/04/2020  . Generalized weakness 01/04/2020  . Hyponatremia 01/04/2020  . Hyperkalemia 01/04/2020  . CAD (coronary artery disease) 01/04/2020  . HFrEF (heart failure with reduced ejection fraction) ([place]) 01/04/2020  . ICD (implantable cardioverter-defibrillator) in place 12/15/2019  . Acute renal failure ([place]) 09/02/2019  . Hypokalemia 09/02/2019  . Hyperglycemia  09/02/2019  . Hypoalbuminemia 09/02/2019  . Elevated brain natriuretic peptide (BNP) level 09/02/2019  . Lactic acidosis 09/02/2019  . Leukocytosis 09/02/2019  . Proteinuria 09/02/2019  . Hypomagnesemia 09/02/2019  . NSTEMI (non-ST elevated myocardial infarction) ([place]) 09/02/2019  . CKD (chronic kidney disease), stage III b 09/02/2019  . PVC's (premature ventricular contractions) 09/02/2019  . NSVT (nonsustained ventricular tachycardia) ([place]) 09/02/2019  . Thrombocytopenia ([place]) 09/02/2019  . Anemia 09/02/2019  . Severe sepsis with septic shock ([place]) suspected on admission, source: ? aspiration 09/02/2019  . Acute metabolic encephalopathy 14/48/1856  . Acute systolic CHF (congestive heart failure) ([place]) 09/02/2019  . Anticoagulated 09/02/2019  . History of noncompliance with medical treatment 09/02/2019  . Cardiac arrest with ventricular fibrillation ([place])   . Acute on chronic respiratory failure with hypoxia ([place]) 04/23/2019  . Lobar pneumonia ([place]) 04/22/2019  . Aspiration pneumonia ([place]), bilateral   . Demand ischemia of myocardium ([place])   . Atrial flutter ([place])   . Acute on chronic diastolic heart failure ([place])   . Acute respiratory failure with hypoxemia ([place]) 07/02/2016  . COPD (chronic obstructive pulmonary disease) ([place]) 07/02/2016  . Tobacco use 07/17/2013  . Peripheral arterial disease ([place]) 12/03/2012  . Coronary artery disease s/p DES to LAD in 2010, s/p PCI to proximal LAD stenosis with DES this hospitalization 12/03/2012  . Essential hypertension 12/03/2012  . Hyperlipidemia 12/03/2012  . Type 2 diabetes mellitus ([place]) 12/03/2012    Past Surgical History:  Procedure Laterality  Date  . CARDIAC SURGERY    . COLONOSCOPY WITH PROPOFOL N/A 01/08/2020   Procedure: COLONOSCOPY WITH PROPOFOL;  Surgeon: Eloise [name], DO;  Location[facility] ENDO SU[facility];  Service: Endoscopy;  Laterality: N/A;  . CORONARY STENT INTERVENTION N/A 09/03/2019   Procedure: CORONARY STENT  INTERVENTION;  Surgeon: Burnell Blanks, MD;  Location: [place] CV LAB;  Service: Cardiovascular;  Laterality: N/A;  . CORONARY STENT PLACEMENT    . ESOPHAGOGASTRODUODENOSCOPY (EGD) WITH PROPOFOL N/A 01/06/2020   Procedure: ESOPHAGOGASTRODUODENOSCOPY (EGD) WITH PROPOFOL;  Surgeon: Rogene Houston, MD;  Location: [facility] ENDO [facility];  Service: Endoscopy;  Laterality: N/A;  . ICD IMPLANT N/A 09/04/2019   Procedure: ICD IMPLANT;  Surgeon: Evans Lance, MD;  Location: [place] CV LAB;  Service: Cardiovascular;  Laterality: N/A;  . RIGHT/LEFT HEART CATH AND CORONARY ANGIOGRAPHY N/A 09/03/2019   Procedure: RIGHT/LEFT HEART CATH AND CORONARY ANGIOGRAPHY;  Surgeon: Jolaine Artist, MD;  Location: [place] CV LAB;  Service: Cardiovascular;  Laterality: N/A;       Family History  Problem Relation Age of Onset  . Cancer Mother        not sure location   . Colon cancer Neg Hx   . Stomach cancer Neg Hx     Social History   Tobacco Use  . Smoking status: Former Smoker    Packs/day: 0.50    Years: 52.00    Pack years: 26.00    Types: Cigarettes  . Smokeless tobacco: Never Used  Substance Use Topics  . Alcohol use: No  . Drug use: No    Home Medications Prior to Admission medications   Medication Sig Start Date End Date Taking? Authorizing Provider  acetaminophen (TYLENOL) 325 MG tablet Take 2 tablets (650 mg total) by mouth every 6 (six) hours as needed for mild pain, fever or headache (or Fever >/= 101). 04/28/19   Roxan Hockey, MD  albuterol (VENTOLIN HFA) 108 (90 Base) MCG/ACT inhaler Inhale 2 puffs into the lungs every 4 (four) hours as needed for wheezing or shortness of breath. 04/28/19   Roxan Hockey, MD  amiodarone (PACERONE) 200 MG tablet Take 1 tablet (200 mg total) by mouth daily. Patient not taking: Reported on 01/19/2020 01/09/20   Murlean Iba, MD  apixaban (ELIQUIS) 5 MG TABS tablet Take 1 tablet (5 mg total) by mouth 2 (two) times daily. 10/06/19  01/19/20  Lorretta Harp, MD  aspirin EC 81 MG tablet Take 1 tablet (81 mg total) by mouth daily with breakfast. 04/28/19 04/27/20  Roxan Hockey, MD  atorvastatin (LIPITOR) 80 MG tablet Take 1 tablet (80 mg total) by mouth daily. 01/08/20 02/07/20  Murlean Iba, MD  carvedilol (COREG) 3.125 MG tablet Take 1 tablet (3.125 mg total) by mouth 2 (two) times daily with a meal. 09/05/19 01/19/20  Marylyn Ishihara, Tyrone A, DO  colchicine 0.6 MG tablet Take 0.6 mg by mouth daily.  05/23/18   [provider]  ferrous sulfate (FERROUSUL) 325 (65 FE) MG tablet Take 1 tablet (325 mg total) by mouth daily with breakfast. 01/19/20   Rehman, Mechele Dawley, MD  fluocinonide cream (LIDEX) 7.82 % Apply 1 application topically 2 (two) times daily. 12/23/19   [provider]  guaiFENesin (MUCINEX) 600 MG 12 hr tablet Take 600 mg by mouth.    [provider]  isosorbide mononitrate (IMDUR) 30 MG 24 hr tablet Take 30 mg by mouth daily.    [provider]  losartan (COZAAR) 25 MG  tablet Take 1 tablet (25 mg total) by mouth daily. 09/06/19 01/19/20  Cherylann Ratel A, DO  lubiprostone (AMITIZA) 8 MCG capsule Take 1 capsule (8 mcg total) by mouth 2 (two) times daily with a meal. 01/19/20   Rehman, Mechele Dawley, MD  nitroGLYCERIN (NITROSTAT) 0.4 MG SL tablet PLACE 1 TABLET UNDER THE TONGUE EVERY 5 MINUTES AS NEEDED FOR CHEST PAIN Patient taking differently: Place 0.4 mg under the tongue every 5 (five) minutes as needed. PLACE 1 TABLET UNDER THE TONGUE EVERY 5 MINUTES AS NEEDED FOR CHEST PAIN 11/26/19   Lorretta Harp, MD  omeprazole (PRILOSEC) 20 MG capsule Take 1 capsule (20 mg total) by mouth daily. 02/04/20 03/05/20  Suella Broad A, PA-C  polyethylene glycol (MIRALAX / GLYCOLAX) 17 g packet Take 17 g by mouth daily. For bowel Patient not taking: Reported on 01/19/2020 04/28/19   Roxan Hockey, MD  Sennosides (EX-LAX PO) Take by mouth as needed.    [provider]  spironolactone (ALDACTONE)  50 MG tablet Take 50 mg by mouth daily.    [provider]  vitamin B-12 1000 MCG tablet Take 1 tablet (1,000 mcg total) by mouth daily. Patient not taking: Reported on 01/19/2020 01/09/20   Murlean Iba, MD  potassium chloride (KLOR-CON) 20 MEQ packet TAKE 1 TABLET BY MOUTH EVERY DAY 12/24/12 07/14/13  Lorretta Harp, MD    Allergies    Patient has no known allergies.  Review of Systems   Review of Systems  Constitutional: Negative for chills and fever.  Respiratory: Negative for shortness of breath.   Cardiovascular: Negative for chest pain.  Gastrointestinal: Positive for constipation. Negative for abdominal pain and diarrhea.  Genitourinary: Negative for difficulty urinating.  Musculoskeletal: Negative for arthralgias and myalgias.  Skin: Negative for rash and wound.  Neurological: Negative for weakness and light-headedness.  Psychiatric/Behavioral: Negative for confusion.  All other systems reviewed and are negative.   Physical Exam Updated Vital Signs BP (!) 142/90   Pulse 66   Temp 98.9 F (37.2 C) (Oral)   Resp 16   Ht 5\' 8"  (1.727 m)   Wt 73.9 kg   SpO2 100%   BMI 24.78 kg/m   Physical Exam Vitals and nursing note reviewed.  Constitutional:      General: He is not in acute distress.    Appearance: He is well-developed and well-nourished. He is not diaphoretic.  HENT:     Head: Normocephalic and atraumatic.     Mouth/Throat:     Mouth: Mucous membranes are moist.  Eyes:     Conjunctiva/sclera: Conjunctivae normal.  Cardiovascular:     Rate and Rhythm: Normal rate and regular rhythm.     Pulses: Normal pulses.     Heart sounds: Normal heart sounds.  Pulmonary:     Effort: Pulmonary effort is normal.     Breath sounds: Normal breath sounds.  Abdominal:     Palpations: Abdomen is soft.     Tenderness: There is no abdominal tenderness.  Genitourinary:    Comments: Declines rectal exam for Hemoccult Musculoskeletal:     Right lower leg:  No edema.     Left lower leg: No edema.  Skin:    General: Skin is warm and dry.     Findings: No erythema or rash.  Neurological:     Mental Status: He is alert and oriented to person, place, and time.  Psychiatric:        Mood and Affect: Mood and affect normal.  Behavior: Behavior normal.     ED Results / Procedures / Treatments   Labs (all labs ordered are listed, but only abnormal results are displayed) Labs Reviewed  COMPREHENSIVE METABOLIC PANEL - Abnormal; Notable for the following components:      Result Value   Glucose, Bld 147 (*)    Creatinine, Ser 1.71 (*)    Calcium 8.5 (*)    GFR, Estimated 41 (*)    All other components within normal limits  CBC WITH DIFFERENTIAL/PLATELET - Abnormal; Notable for the following components:   RBC 2.62 (*)    Hemoglobin 7.0 (*)    HCT 24.2 (*)    MCHC 28.9 (*)    RDW 24.4 (*)    Abs Immature Granulocytes 0.13 (*)    All other components within normal limits  HEMOGLOBIN AND HEMATOCRIT, BLOOD - Abnormal; Notable for the following components:   Hemoglobin 8.2 (*)    HCT 26.9 (*)    All other components within normal limits  TYPE AND SCREEN  PREPARE RBC (CROSSMATCH)    EKG None  Radiology DG UGI W DOUBLE CM (HD BA)  Result Date: 02/03/2020 CLINICAL DATA:  Paraesophageal hernia EXAM: UPPER GI SERIES WITH KUB TECHNIQUE: After obtaining a scout radiograph a routine upper GI series was performed using high density barium as well as effervescent granules and a 12.5 mm diameter barium tablet FLUOROSCOPY TIME:  Fluoroscopy Time:  1 minutes 6 seconds Radiation Exposure Index (if provided by the fluoroscopic device): 59.7 mGy Number of Acquired Spot Images: 4 plus multiple fluoroscopic screen captures COMPARISON:  CT angio chest 04/20/2019 FINDINGS: Normal esophageal distention without mass or stricture. Age-related esophageal dysmotility. Tiny hiatal hernia and minimal gastroesophageal reflux noted. No definite paraesophageal  hernia identified. Targeted rapid sequence imaging of the cervical esophagus and hypopharynx demonstrated normal motion without laryngeal penetration or aspiration. 12.5 mm diameter barium tablet passed from oral cavity to stomach without obstruction. Stomach distends normally with normal rugal folds. No gastric mass, ulceration, or outlet obstruction. Duodenal bulb and sweep normal appearance. Ligament of Treitz normal position. Visualize jejunal loops normal. IMPRESSION: Tiny hiatal hernia with minimal gastroesophageal reflux. Otherwise normal exam. Electronically Signed   By: Lavonia Dana M.D.   On: 02/03/2020 11:08    Procedures Procedures (including critical care time)  Medications Ordered in ED Medications  0.9 %  sodium chloride infusion (Manually program via Guardrails IV Fluids) ( Intravenous Stopped 02/04/20 2100)    ED Course  I have reviewed the triage vital signs and the nursing notes.  Pertinent labs & imaging results that were available during my care of the patient were reviewed by me and considered in my medical decision making (see chart for details).  Clinical Course as of 02/04/20 2234  Thu Feb 03, 2233  [id number] 74 year old male with history of anemia, recently admitted and transfused, endoscopy and colonoscopy unable to identify source, hemoccult positive stools on prior admission. Patient was contacted today by his GI clinic and told he needed to go to the ER for a transfusion. Patient feels well, was out running errands, is agreeable to transfusion of 1 unit PRBCs but refuses admission. Discussed with Dr. Gala Romney with GI, if patient is refusing admission and is asymptomatic and stable vitals, can be advised to stop his Eliquis and ASA, take a daily PPI and follow up in clinic next week. Recommends post transfusion H&H. [LM]  1918 Patient is receiving transfusion at this time, and tolerating well.  Discussed discontinuing his  Eliquis and aspirin until follow-up with GI next week.   Plan is to [facility] PPI [facility]. [LM]  1918 Transfusion complete, recheck H&H and discharge if improved. [LM]  2234 Hgb has increased to 8.2, patient discharged as discussed as he does not agree to further workup at this time and will follow up with his GI. [LM]    Clinical Course User Index [LM] Roque Lias   MDM Rules/Calculators/A&P                          Final Clinical Impression(s) / ED Diagnoses Final diagnoses:  Anemia, unspecified type    Rx / DC Orders ED Discharge Orders         Ordered    omeprazole (PRILOSEC) 20 MG capsule  Daily        02/04/20 1920           Tacy Learn, PA-C 02/04/20 2235    Davonna Belling, MD 02/04/20 2317

## 2020-02-04 NOTE — Telephone Encounter (Signed)
Tammy please ask him to go to the ER, he needs at least one unit PRBC to be transfused.  Thanks

## 2020-02-05 ENCOUNTER — Telehealth: Payer: Self-pay

## 2020-02-05 LAB — TYPE AND SCREEN
ABO/RH(D): B POS
Antibody Screen: NEGATIVE
Unit division: 0

## 2020-02-05 LAB — BPAM RBC
Blood Product Expiration Date: 202201042359
ISSUE DATE / TIME: 202112091741
Unit Type and Rh: 1700

## 2020-02-05 NOTE — Telephone Encounter (Signed)
Romie Minus called because she was told to schedule a MRI for the patient. He has a bsx ICD. The pt keeps falling and they trying to figure out why. She wanted to know if it was compatible. She was not sure if we are supposed to schedule MRIs. I told her usually whoever wants the MRI schedules the MRI. She states she was going to fax the papers that the doctor office sent her for [place] to tell them it is MRI compatible or not. Then she wants [place] to give it to Dr. Lovena Le. I gave the paperwork to [facility] [place], rn.

## 2020-02-05 NOTE — Telephone Encounter (Signed)
Patients device is compatible with MRI. Called Dr. Freddie Apley office to notify staff. No answer, left generic VM to call back, direct phone number provided.

## 2020-02-08 ENCOUNTER — Other Ambulatory Visit (INDEPENDENT_AMBULATORY_CARE_PROVIDER_SITE_OTHER): Payer: Self-pay

## 2020-02-08 ENCOUNTER — Other Ambulatory Visit: Payer: Self-pay

## 2020-02-08 ENCOUNTER — Encounter (INDEPENDENT_AMBULATORY_CARE_PROVIDER_SITE_OTHER): Payer: Self-pay

## 2020-02-08 ENCOUNTER — Other Ambulatory Visit (INDEPENDENT_AMBULATORY_CARE_PROVIDER_SITE_OTHER): Payer: Self-pay | Admitting: Gastroenterology

## 2020-02-08 ENCOUNTER — Telehealth (INDEPENDENT_AMBULATORY_CARE_PROVIDER_SITE_OTHER): Payer: Self-pay

## 2020-02-08 ENCOUNTER — Other Ambulatory Visit: Payer: Self-pay | Admitting: Neurology

## 2020-02-08 ENCOUNTER — Ambulatory Visit (INDEPENDENT_AMBULATORY_CARE_PROVIDER_SITE_OTHER): Payer: PPO | Admitting: Gastroenterology

## 2020-02-08 ENCOUNTER — Other Ambulatory Visit (HOSPITAL_COMMUNITY): Payer: Self-pay | Admitting: Neurology

## 2020-02-08 ENCOUNTER — Encounter (INDEPENDENT_AMBULATORY_CARE_PROVIDER_SITE_OTHER): Payer: Self-pay | Admitting: Gastroenterology

## 2020-02-08 VITALS — BP 102/59 | HR 65 | Temp 98.1°F | Ht 68.0 in | Wt 165.0 lb

## 2020-02-08 DIAGNOSIS — Z1211 Encounter for screening for malignant neoplasm of colon: Secondary | ICD-10-CM

## 2020-02-08 DIAGNOSIS — K5900 Constipation, unspecified: Secondary | ICD-10-CM

## 2020-02-08 DIAGNOSIS — R2689 Other abnormalities of gait and mobility: Secondary | ICD-10-CM

## 2020-02-08 DIAGNOSIS — G959 Disease of spinal cord, unspecified: Secondary | ICD-10-CM

## 2020-02-08 DIAGNOSIS — D500 Iron deficiency anemia secondary to blood loss (chronic): Secondary | ICD-10-CM

## 2020-02-08 MED ORDER — NA SULFATE-K SULFATE-MG SULF 17.5-3.13-1.6 GM/177ML PO SOLN: 354.0000 mL | Freq: Once | ORAL | 0 refills | Status: AC

## 2020-02-08 MED ORDER — DULCOLAX 5 MG PO TBEC: 5.0000 mg | DELAYED_RELEASE_TABLET | Freq: Every day | ORAL | 0 refills | Status: DC | PRN

## 2020-02-08 MED ORDER — PLENVU 140 G PO SOLR: 280.0000 mg | Freq: Once | ORAL | 0 refills | Status: DC

## 2020-02-08 MED ORDER — MAGNESIUM CITRATE PO SOLN: 1.0000 | Freq: Once | ORAL | 0 refills | Status: AC

## 2020-02-08 NOTE — Telephone Encounter (Signed)
Peter Lambert, CMA  

## 2020-02-08 NOTE — Patient Instructions (Addendum)
Continue oral iron 325 mg every day Referral to hematology for possible iron infusion Schedule colonoscopy If colonoscopy is unremarkable, will schedule for EGD for capsule endoscopy deployment Perform blood workup in one week Start taking Miralax 1 cap every day for one week. If bowel movements do not improve, increase to 1 cup every 12 hours. If after two weeks there is no improvement, increase to 1 cup every 8 hours

## 2020-02-08 NOTE — Progress Notes (Signed)
Peter Lambert, M.D. Gastroenterology & Hepatology [facility] [address] [place], [place] [address]  Primary Care Physician: Peter School, MD [address] [place] [address]  I will communicate my assessment and recommendations to the referring MD via EMR. "Note: Occasional unusual wording and randomly placed punctuation marks may result from the use of speech recognition technology to transcribe this document"  Problems: 1. Iron deficiency anemia  History of Present Illness: Peter Lambert is a 74 y.o. male with past medical history of coronary artery disease status post stent placement, hypertension, hyperlipidemia, diabetes, peripheral artery disease, who presents for follow up of iron deficiency anemia.  The patient was last seen on 01/19/2020. At that time, the patient was counseled to have a barium swallow and upper GI series performed, while he was counseled to start ferrous sulfate 325 mg every day.  In between today's appointment and his last appointment, the patient had repeat blood testing which showed at hemoglobin of 7.0 on 02/04/2020.  The patient was directed to go to the ER to receive a blood transfusion.  Repeat hemoglobin was 8.2 and patient was discharged home.  Patient reports feeling fine denies having any complaints.  He actually states he was feeling well even before he was sent to the ED. Occasionally feels lightly dizzy when he makes a quick move but this is infrequent. He denies having any syncope, nausea, vomiting, fever, chills, hematochezia, melena, hematemesis, abdominal distention, abdominal pain, diarrhea, jaundice, pruritus or weight loss.  He only has felt some constipation while taking the iron pills and is not taking the medication frequently for the constipation. He is currently taking iron PO, which he has been taking since his last hospitalization on November 2021.  Last EGD: 01/06/2020,  presence of possible extrinsic compression of the mid esophagus, presence of a medium sized paraesophageal hernia, a single nonbleeding AVM was found in the duodenum. Last Colonoscopy: 01/06/2020 Presence of diverticulosis, preparation was inadequate due to the amount of stool, hemorrhoids were found, recommended to have repeat colonoscopy in 3 months.  FHx: neg for any gastrointestinal/liver disease, brother colon cancer in his 15s, father prostate cancer, mother colon cancer in her 64s Social: neg smoking, alcohol or illicit drug use Surgical: non contributory  Past Medical History: Past Medical History:  Diagnosis Date  . Coronary artery disease    a. s/p NSTEMI in 2010 with DES to proximal LAD with D1 jailed and angioplasty alone to ostium  . Hyperlipidemia   . Hypertension   . Hypertension   . Peripheral arterial disease ([place])   . Tobacco abuse   . Type 2 diabetes mellitus ([place])     Past Surgical History: Past Surgical History:  Procedure Laterality Date  . CARDIAC SURGERY    . COLONOSCOPY WITH PROPOFOL N/A 01/08/2020   Procedure: COLONOSCOPY WITH PROPOFOL;  Surgeon: Eloise [name], DO;  [facility];  Service: Endoscopy;  Laterality: N/A;  . CORONARY STENT INTERVENTION N/A 09/03/2019   Procedure: CORONARY STENT INTERVENTION;  Surgeon: Burnell Blanks, MD;  Location: [place] [facility];  Service: Cardiovascular;  Laterality: N/A;  . CORONARY STENT PLACEMENT    . ESOPHAGOGASTRODUODENOSCOPY (EGD) WITH PROPOFOL N/A 01/06/2020   Procedure: ESOPHAGOGASTRODUODENOSCOPY (EGD) WITH PROPOFOL;  Surgeon: Rogene Houston, MD;  [facility];  Service: Endoscopy;  Laterality: N/A;  . ICD IMPLANT N/A 09/04/2019   Procedure: ICD IMPLANT;  Surgeon: Evans Lance, MD;  Location: [place] [facility];  Service: Cardiovascular;  Laterality: N/A;  . RIGHT/LEFT HEART CATH AND CORONARY ANGIOGRAPHY N/A 09/03/2019   Procedure: RIGHT/LEFT HEART CATH AND CORONARY ANGIOGRAPHY;   Surgeon: Jolaine Artist, MD;  Location: [place] [facility];  Service: Cardiovascular;  Laterality: N/A;    Family History: Family History  Problem Relation Age of Onset  . Cancer Mother        not sure location   . Colon cancer Neg Hx   . Stomach cancer Neg Hx     Social History: Social History   Tobacco Use  Smoking Status Former Smoker  . Packs/day: 0.50  . Years: 52.00  . Pack years: 26.00  . Types: Cigarettes  Smokeless Tobacco Never Used   Social History   Substance and Sexual Activity  Alcohol Use No   Social History   Substance and Sexual Activity  Drug Use No    Allergies: No Known Allergies  Medications: Current Outpatient Medications  Medication Sig Dispense Refill  . acetaminophen (TYLENOL) 325 MG tablet Take 2 tablets (650 mg total) by mouth every 6 (six) hours as needed for mild pain, fever or headache (or Fever >/= 101). 30 tablet 0  . albuterol (VENTOLIN HFA) 108 (90 Base) MCG/ACT inhaler Inhale 2 puffs into the lungs every 4 (four) hours as needed for wheezing or shortness of breath. 18 g 3  . carvedilol (COREG) 3.125 MG tablet Take 1 tablet (3.125 mg total) by mouth 2 (two) times daily with a meal. 60 tablet 0  . colchicine 0.6 MG tablet Take 0.6 mg by mouth daily.     . cyanocobalamin (,VITAMIN B-12,) 1000 MCG/ML injection Inject 1,000 mcg into the muscle every 30 (thirty) days.    . ferrous sulfate (FERROUSUL) 325 (65 FE) MG tablet Take 1 tablet (325 mg total) by mouth daily with breakfast.  0  . fluocinonide cream (LIDEX) 8.25 % Apply 1 application topically 2 (two) times daily.    Marland Kitchen guaiFENesin (MUCINEX) 600 MG 12 hr tablet Take 600 mg by mouth.    . isosorbide mononitrate (IMDUR) 30 MG 24 hr tablet Take 30 mg by mouth daily.    Marland Kitchen losartan (COZAAR) 25 MG tablet Take 1 tablet (25 mg total) by mouth daily. 30 tablet 0  . lubiprostone (AMITIZA) 8 MCG capsule Take 1 capsule (8 mcg total) by mouth 2 (two) times daily with a meal. 60 capsule 5  .  nitroGLYCERIN (NITROSTAT) 0.4 MG SL tablet PLACE 1 TABLET UNDER THE TONGUE EVERY 5 MINUTES AS NEEDED FOR CHEST PAIN (Patient taking differently: Place 0.4 mg under the tongue every 5 (five) minutes as needed. PLACE 1 TABLET UNDER THE TONGUE EVERY 5 MINUTES AS NEEDED FOR CHEST PAIN) 25 tablet 3  . omeprazole (PRILOSEC) 20 MG capsule Take 1 capsule (20 mg total) by mouth daily. 30 capsule 0  . polyethylene glycol (MIRALAX / GLYCOLAX) 17 g packet Take 17 g by mouth daily. For bowel 30 each 5  . spironolactone (ALDACTONE) 50 MG tablet Take 50 mg by mouth daily.    . vitamin B-12 1000 MCG tablet Take 1 tablet (1,000 mcg total) by mouth daily. 30 tablet 2  . amiodarone (PACERONE) 200 MG tablet Take 1 tablet (200 mg total) by mouth daily. (Patient not taking: No sig reported)    . aspirin EC 81 MG tablet Take 1 tablet (81 mg total) by mouth daily with breakfast. (Patient not taking: Reported on 02/08/2020) 30 tablet 2   No current facility-administered medications for this visit.  Review of Systems: GENERAL: negative for malaise, night sweats HEENT: No changes in hearing or vision, no nose bleeds or other nasal problems. NECK: Negative for lumps, goiter, pain and significant neck swelling RESPIRATORY: Negative for cough, wheezing CARDIOVASCULAR: Negative for chest pain, leg swelling, palpitations, orthopnea GI: SEE HPI MUSCULOSKELETAL: Negative for joint pain or swelling, back pain, and muscle pain. SKIN: Negative for lesions, rash PSYCH: Negative for sleep disturbance, mood disorder and recent psychosocial stressors. HEMATOLOGY Negative for prolonged bleeding, bruising easily, and swollen nodes. ENDOCRINE: Negative for cold or heat intolerance, polyuria, polydipsia and goiter. NEURO: negative for tremor, gait imbalance, syncope and seizures. The remainder of the review of systems is noncontributory.   Physical Exam: BP (!) 102/59 (BP Location: Right Arm, Patient Position: Sitting, Cuff Size:  Large)   Pulse 65   Temp 98.1 F (36.7 C) (Oral)   Ht 5\' 8"  (1.727 m)   Wt 165 lb (74.8 kg)   BMI 25.09 kg/m  GENERAL: The patient is AO x3, in no acute distress. HEENT: Head is normocephalic and atraumatic. EOMI are intact. Mouth is well hydrated and without lesions. NECK: Supple. No masses LUNGS: Clear to auscultation. No presence of rhonchi/wheezing/rales. Adequate chest expansion HEART: RRR, normal s1 and s2. ABDOMEN: Soft, nontender, no guarding, no peritoneal signs, and nondistended. BS +. No masses. EXTREMITIES: Without any cyanosis, clubbing, rash, lesions or edema. NEUROLOGIC: AOx3, no focal motor deficit. SKIN: no jaundice, no rashes  Imaging/Labs: as above  I personally reviewed and interpreted the available labs, imaging and endoscopic files.  Impression and Plan: Peter Lambert is a 74 y.o. male with past medical history of coronary artery disease status post stent placement, hypertension, hyperlipidemia, diabetes, peripheral artery disease, who presents for follow up of iron deficiency anemia.  The patient has presented recurrent anemia despite taking oral iron recently, for which he had to undergo repeat blood transfusion.  He denies any overt symptoms of gastrointestinal bleeding but given the findings and his recent endoscopic evaluation, I will start his evaluation with a repeat colonoscopy with an extended bowel prep which I explained to the patient.  If this is negative for any alteration, will need to proceed with an EGD with deployment of capsule endoscopy as it is very likely the capsule endoscopy will take longer to empty his stomach if swallow in a regular fashion.  He should continue taking his oral iron but I will refer him to hematology for possible IV iron infusion.  We will recheck a CBC next week.  Finally, in terms of his constipation he can benefit from his starting MiraLAX every day as this may improve his bowel movement frequency.  Patient understood and  agreed.  - Continue oral iron 325 mg every day - Referral to hematology for possible iron infusion - Schedule colonoscopy - If colonoscopy is unremarkable, will schedule for EGD for capsule endoscopy deployment - Check CBC in one week - Start taking Miralax 1 cap every day for one week. If bowel movements do not improve, increase to 1 cup every 12 hours. If after two weeks there is no improvement, increase to 1 cup every 8 hours  All questions were answered.      Harvel Quale, MD Gastroenterology and Hepatology [facility]

## 2020-02-10 ENCOUNTER — Other Ambulatory Visit (HOSPITAL_COMMUNITY): Payer: PPO

## 2020-02-11 DIAGNOSIS — E538 Deficiency of other specified B group vitamins: Secondary | ICD-10-CM | POA: Diagnosis not present

## 2020-02-15 NOTE — Telephone Encounter (Signed)
Called and spoke to [place] at Dr. Merlene Laughter, advised device is MRI compatible.

## 2020-02-16 ENCOUNTER — Other Ambulatory Visit (INDEPENDENT_AMBULATORY_CARE_PROVIDER_SITE_OTHER): Payer: Self-pay | Admitting: *Deleted

## 2020-02-16 DIAGNOSIS — D500 Iron deficiency anemia secondary to blood loss (chronic): Secondary | ICD-10-CM | POA: Diagnosis not present

## 2020-02-16 LAB — CBC
HCT: 34.9 % — ABNORMAL LOW (ref 38.5–50.0)
Hemoglobin: 12.1 g/dL — ABNORMAL LOW (ref 13.2–17.1)
MCH: 30.3 pg (ref 27.0–33.0)
MCHC: 34.7 g/dL (ref 32.0–36.0)
MCV: 87.5 fL (ref 80.0–100.0)
MPV: 11 fL (ref 7.5–12.5)
Platelets: 288 10*3/uL (ref 140–400)
RBC: 3.99 10*6/uL — ABNORMAL LOW (ref 4.20–5.80)
RDW: 19 % — ABNORMAL HIGH (ref 11.0–15.0)
WBC: 7.8 10*3/uL (ref 3.8–10.8)

## 2020-02-18 ENCOUNTER — Other Ambulatory Visit (INDEPENDENT_AMBULATORY_CARE_PROVIDER_SITE_OTHER): Payer: Self-pay

## 2020-02-18 ENCOUNTER — Telehealth (INDEPENDENT_AMBULATORY_CARE_PROVIDER_SITE_OTHER): Payer: Self-pay

## 2020-02-18 DIAGNOSIS — D500 Iron deficiency anemia secondary to blood loss (chronic): Secondary | ICD-10-CM

## 2020-02-18 NOTE — Telephone Encounter (Signed)
Patient had been having issues with Anemia. Hgb is now back up to 12.1 on 02/16/2020. Patient niece Kyra Searles wants to know now that the hgb is back up does the patient have to keep his appt with Dr. Delton Coombes that is scheduled on 03/02/2019. Patient is now feeling good per his niece. Please advise. Thanks

## 2020-02-18 NOTE — Telephone Encounter (Signed)
Per Dr. Laural Golden patient does not need to keep the appt with Dr. Delton Coombes as hgb is at normal level of 12.1 as of 02/16/2020. Will need hgb and hematocrit drawn around 03/03/2020. Patient and the niece Kyra Searles are aware and will have lab draw around 03/03/2020. Orders mailed to the patient to take with him to lab [facility] on 03/04/2019. Faith will call and cancel that appt.

## 2020-02-22 ENCOUNTER — Other Ambulatory Visit (INDEPENDENT_AMBULATORY_CARE_PROVIDER_SITE_OTHER): Payer: Self-pay | Admitting: Gastroenterology

## 2020-02-22 DIAGNOSIS — D500 Iron deficiency anemia secondary to blood loss (chronic): Secondary | ICD-10-CM

## 2020-02-24 ENCOUNTER — Ambulatory Visit (HOSPITAL_COMMUNITY)
Admission: RE | Admit: 2020-02-24 | Discharge: 2020-02-24 | Disposition: A | Discharge status: Home / Self Care | Payer: PPO | Source: Ambulatory Visit | Attending: Neurology | Admitting: Neurology

## 2020-02-24 ENCOUNTER — Other Ambulatory Visit: Payer: Self-pay

## 2020-02-24 DIAGNOSIS — G9589 Other specified diseases of spinal cord: Secondary | ICD-10-CM | POA: Diagnosis not present

## 2020-02-24 DIAGNOSIS — R2689 Other abnormalities of gait and mobility: Secondary | ICD-10-CM | POA: Diagnosis not present

## 2020-02-24 DIAGNOSIS — R251 Tremor, unspecified: Secondary | ICD-10-CM | POA: Diagnosis not present

## 2020-02-24 DIAGNOSIS — I6782 Cerebral ischemia: Secondary | ICD-10-CM | POA: Diagnosis not present

## 2020-02-24 DIAGNOSIS — G959 Disease of spinal cord, unspecified: Secondary | ICD-10-CM | POA: Diagnosis not present

## 2020-02-24 DIAGNOSIS — M4802 Spinal stenosis, cervical region: Secondary | ICD-10-CM | POA: Diagnosis not present

## 2020-02-24 DIAGNOSIS — Q7649 Other congenital malformations of spine, not associated with scoliosis: Secondary | ICD-10-CM | POA: Diagnosis not present

## 2020-02-24 IMAGING — MR MR HEAD W/O CM
12 of 13 series · 44 of 48 positions shown · non-contrast
Comparison: [DATE].

CLINICAL DATA: Other abnormalities of gait and mobility.

EXAM:
MRI HEAD WITHOUT CONTRAST
TECHNIQUE: Multiplanar, multiecho pulse sequences of the brain and surrounding
structures were obtained without intravenous contrast.

[Series 5: DWI · axial · 3.0mm · 0.88mm/px · z∈[-126,+12]mm · 8 of 100 slices shown (1 of 4)]
[im 1/100]
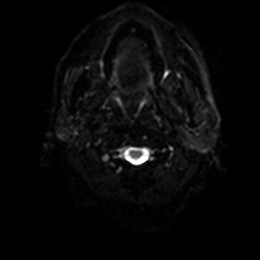
[im 15/100]
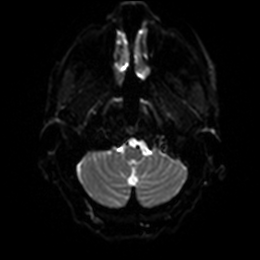
[im 29/100]
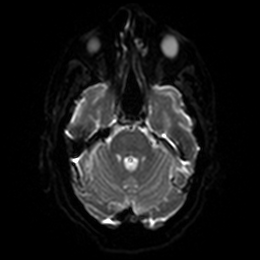
[im 43/100]
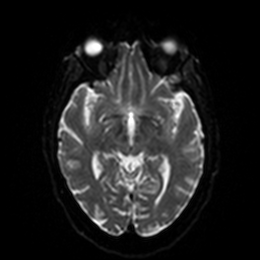
[im 57/100]
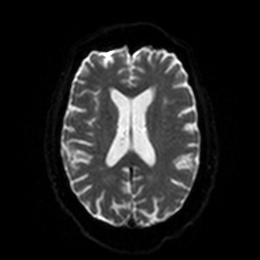
[im 71/100]
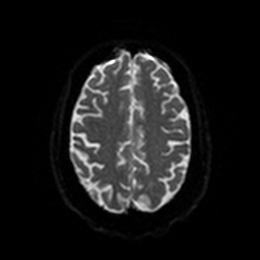
[im 85/100]
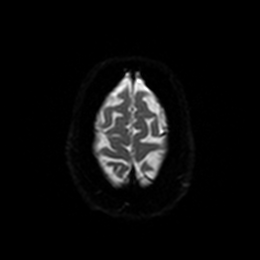
[im 100/100]
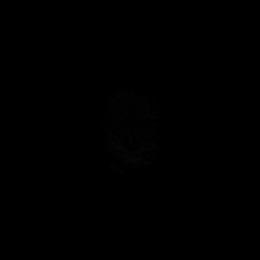

[Series 6: DWI · axial · 3.0mm · 0.88mm/px · z∈[-126,+12]mm · 4 of 50 slices shown (2 of 4)]
[im 1/50]
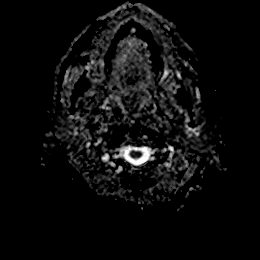
[im 17/50]
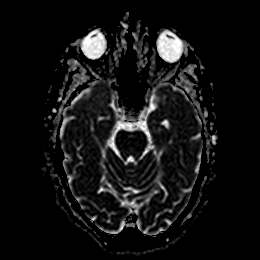
[im 33/50]
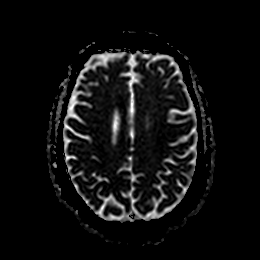
[im 50/50]
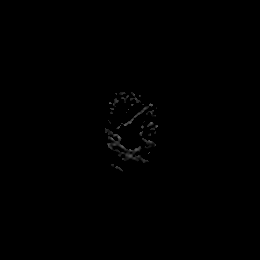

[Series 7: DWI · coronal · 4.0mm · 0.88mm/px · 5 of 68 slices shown (3 of 4)]
[im 1/68]
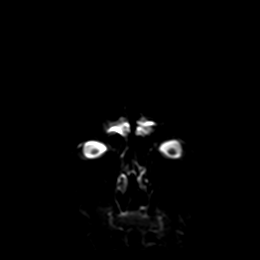
[im 17/68]
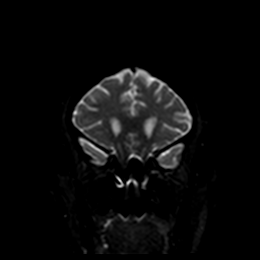
[im 34/68]
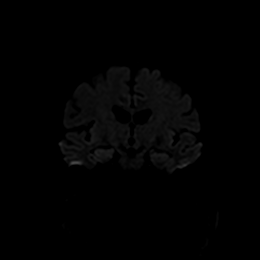
[im 51/68]
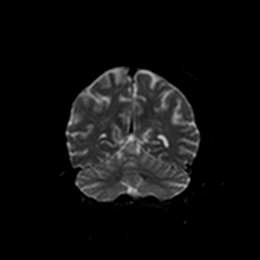
[im 68/68]
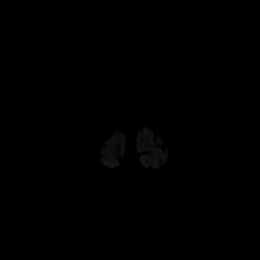

[Series 8: DWI · coronal · 4.0mm · 0.88mm/px · 3 of 34 slices shown (4 of 4)]
[im 1/34]
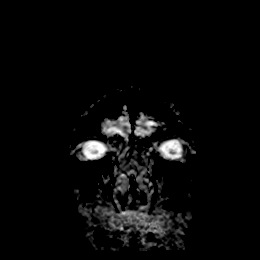
[im 17/34]
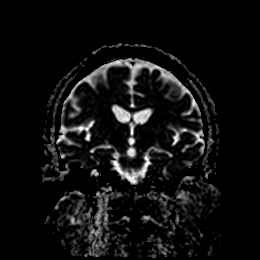
[im 34/34]
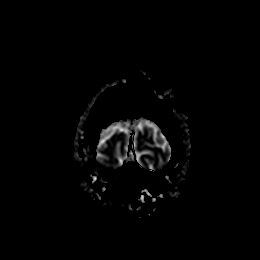

[Series 9: T1 · sagittal · 5.0mm · 0.75mm/px · 2 of 23 slices shown]
[im 1/23]
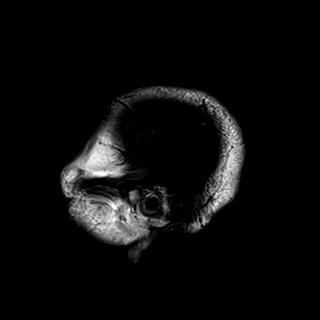
[im 23/23]
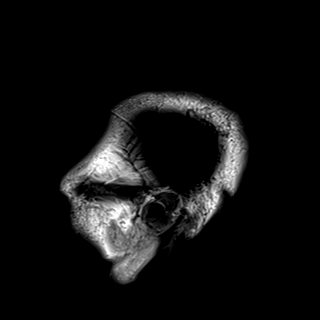

[Series 10: T2 · axial · 5.0mm · 0.75mm/px · z∈[-133,+13]mm · 2 of 27 slices shown (1 of 2)]
[im 1/27]
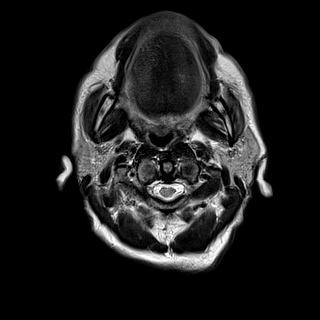
[im 27/27]
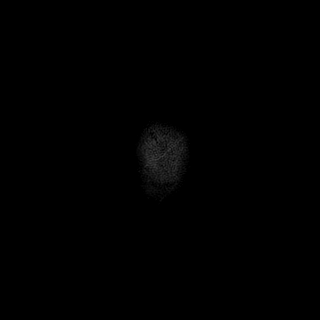

[Series 11: FLAIR · axial · 5.0mm · 0.45mm/px · z∈[-130,+16]mm · 2 of 27 slices shown]
[im 1/27]
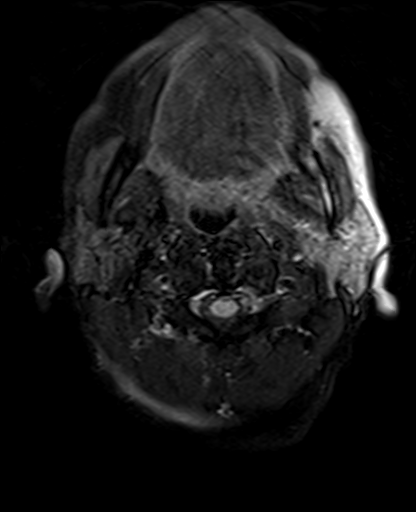
[im 27/27]
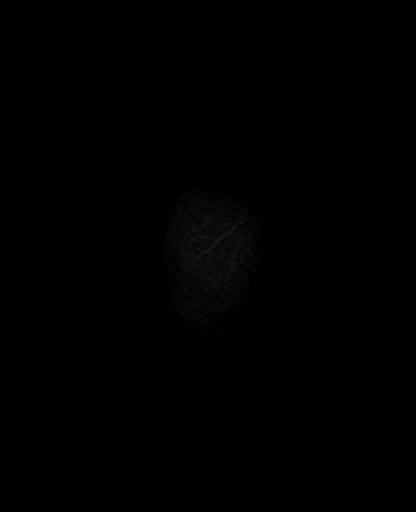

[Series 12: mag_images · axial · 3.0mm · 0.90mm/px · z∈[-135,+30]mm · 4 of 60 slices shown]
[im 1/60]
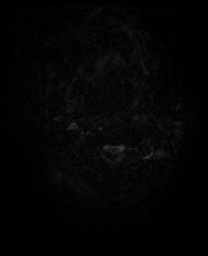
[im 20/60]
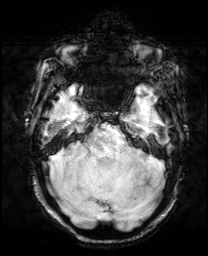
[im 40/60]
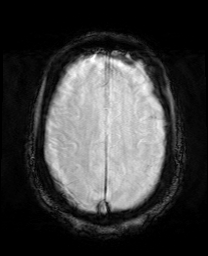
[im 60/60]
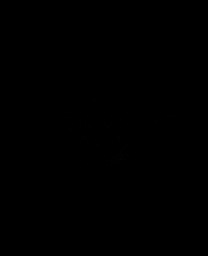

[Series 13: pha_images · axial · 3.0mm · 0.90mm/px · z∈[-135,+22]mm · 4 of 57 slices shown]
[im 1/57]
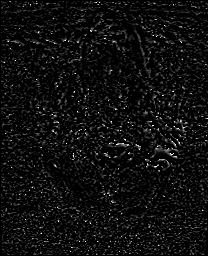
[im 19/57]
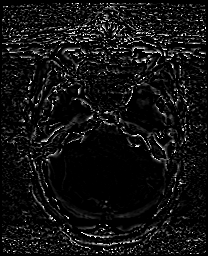
[im 38/57]
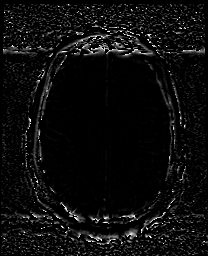
[im 57/57]
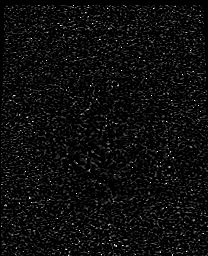

[Series 14: swi_images · axial · 3.0mm · 0.90mm/px · z∈[-135,+30]mm · 4 of 60 slices shown]
[im 1/60]
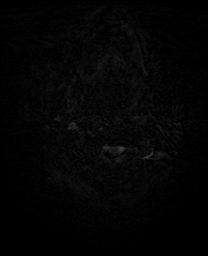
[im 20/60]
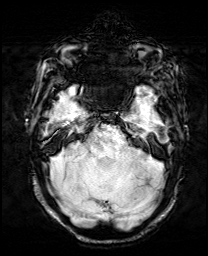
[im 40/60]
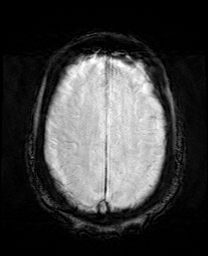
[im 60/60]
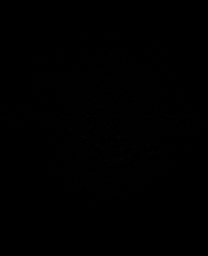

[Series 15: mip_images(sw) · axial · 24.0mm · 0.90mm/px · z∈[-126,+20]mm · 4 of 53 slices shown]
[im 1/53]
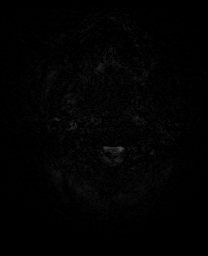
[im 18/53]
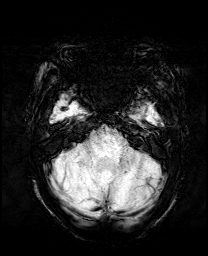
[im 35/53]
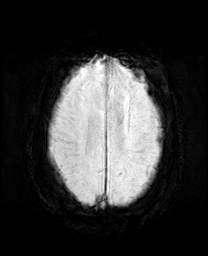
[im 53/53]
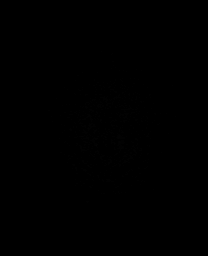

[Series 17: T2 · coronal · 5.0mm · 0.34mm/px · 2 of 29 slices shown (2 of 2)]
[im 1/29]
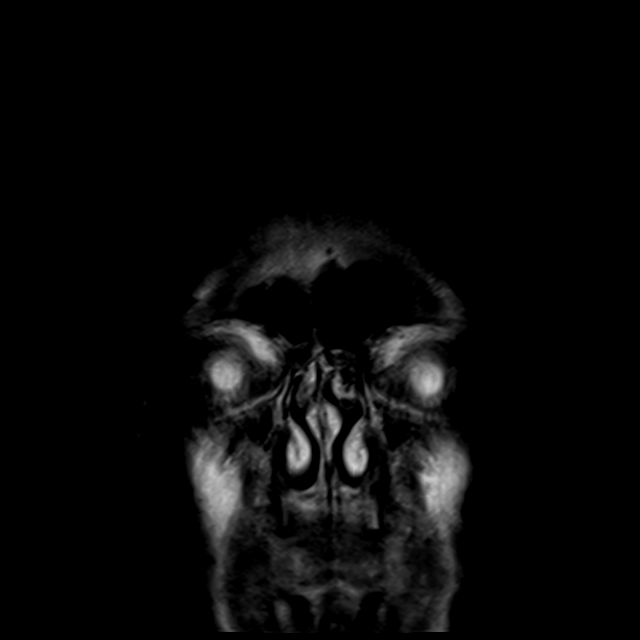
[im 29/29]
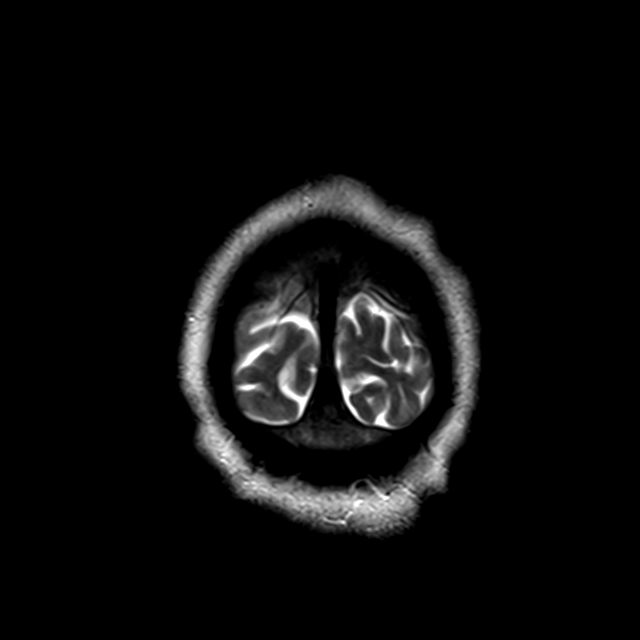

[44 of 48 positions shown; findings below may reference images not displayed]

FINDINGS: Brain: No diffusion-weighted signal abnormality. No intracranial
hemorrhage. No midline shift, ventriculomegaly or extra-axial fluid
collection. No mass lesion. Cerebral volume is within normal limits.
Minimal chronic microvascular ischemic changes.

Vascular: Proximally preserved major intracranial flow voids.

Skull and upper cervical spine: Normal marrow signal.

Sinuses/Orbits: Normal orbits. Clear paranasal sinuses. No mastoid
effusion.

Other: None.
IMPRESSION: No evidence of acute or remote insult.

Minimal chronic microvascular ischemic changes.

## 2020-02-24 IMAGING — MR MR CERVICAL SPINE W/O CM
5 series · 37 of 48 positions shown · non-contrast
Comparison: [DATE].

CLINICAL DATA: Gait instability with pain across shoulder blades.
Left arm tremor.

EXAM:
MRI CERVICAL SPINE WITHOUT CONTRAST
TECHNIQUE: Multiplanar, multisequence MR imaging of the cervical spine was
performed. No intravenous contrast was administered.

[Series 9: T2 · sagittal · 3.0mm · 0.69mm/px · 6 of 15 slices shown (1 of 2)]
[im 1/15]
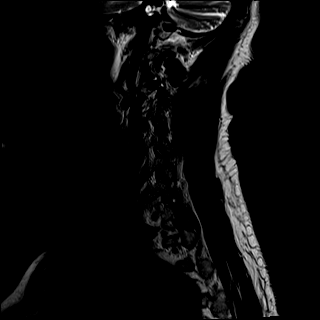
[im 3/15]
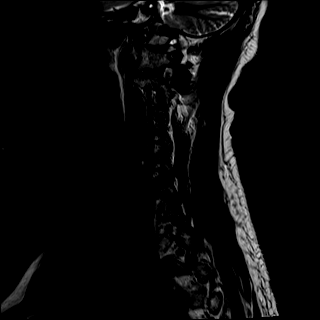
[im 6/15]
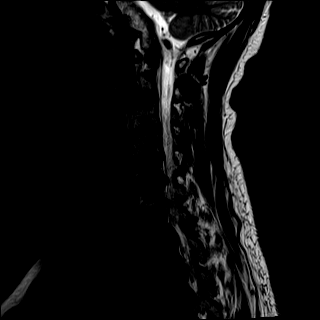
[im 9/15]
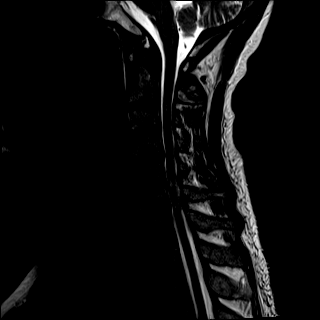
[im 12/15]
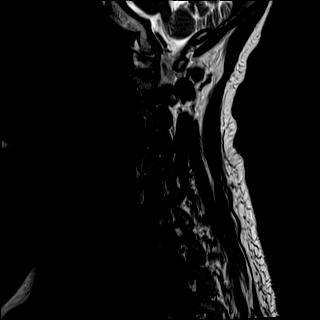
[im 15/15]
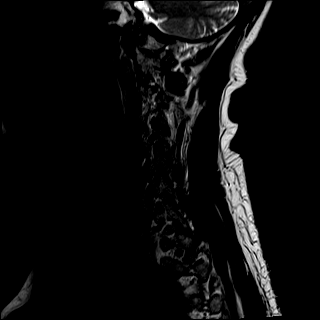

[Series 10: T1 · sagittal · 3.0mm · 0.69mm/px · 6 of 15 slices shown]
[im 1/15]
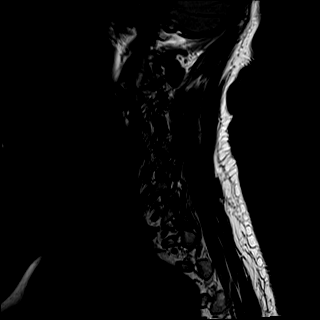
[im 3/15]
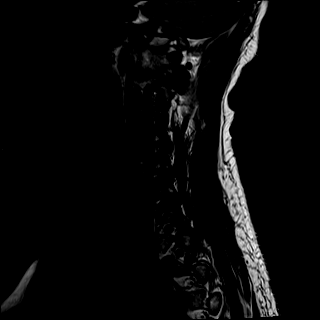
[im 6/15]
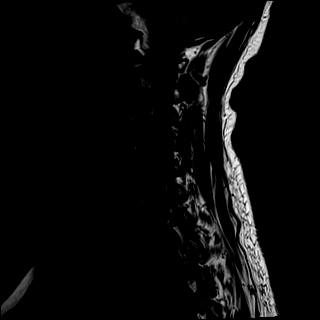
[im 9/15]
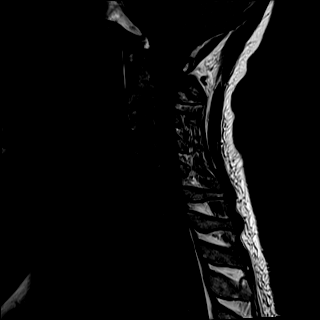
[im 12/15]
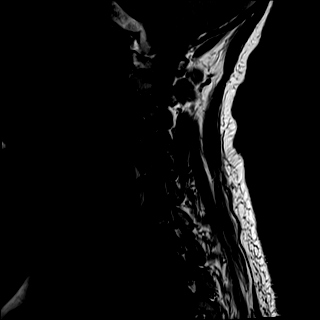
[im 15/15]
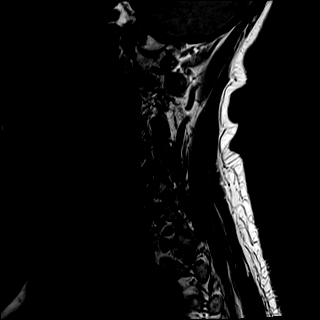

[Series 11: STIR · sagittal · 3.0mm · 0.86mm/px · 6 of 15 slices shown]
[im 1/15]
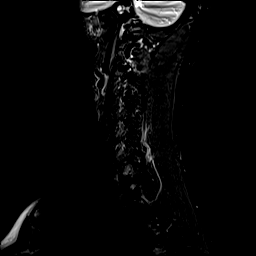
[im 3/15]
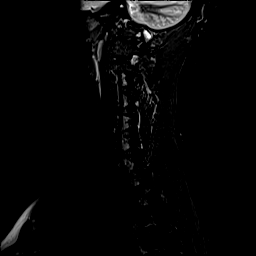
[im 6/15]
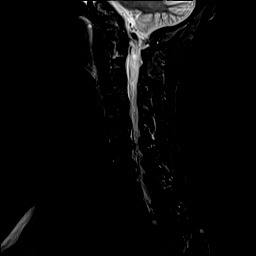
[im 9/15]
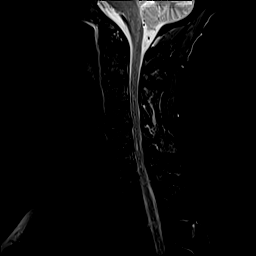
[im 12/15]
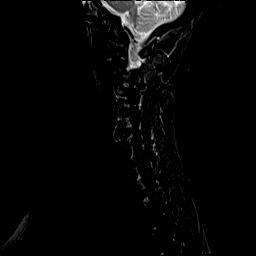
[im 15/15]
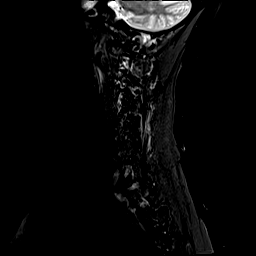

[Series 12: T2 · axial · 3.0mm · 0.66mm/px · z∈[-236,-115]mm · 11 of 40 slices shown (2 of 2)]
[im 1/40]
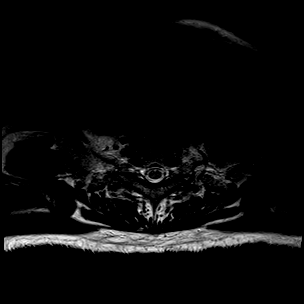
[im 3/40]
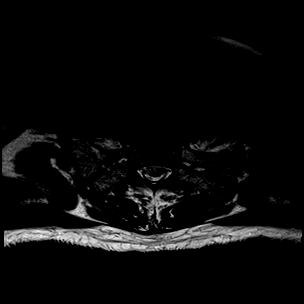
[im 6/40]
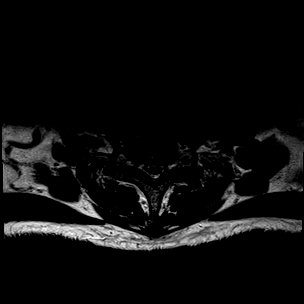
[im 9/40]
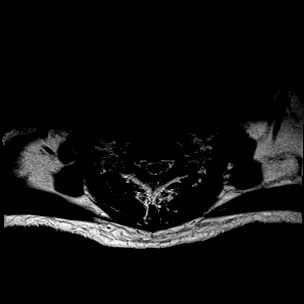
[im 12/40]
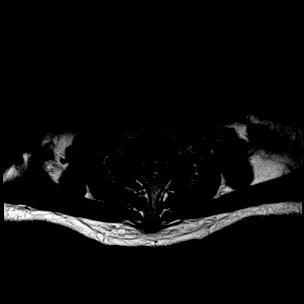
[im 17/40]
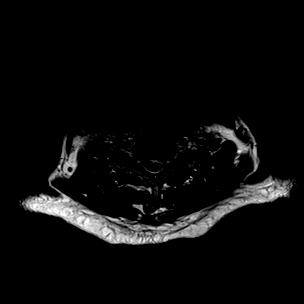
[im 20/40]
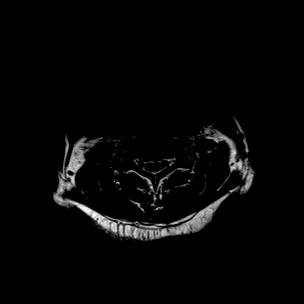
[im 23/40]
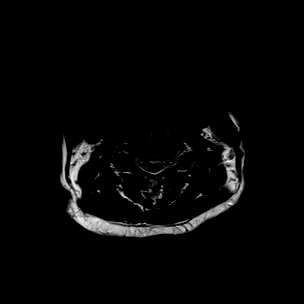
[im 28/40]
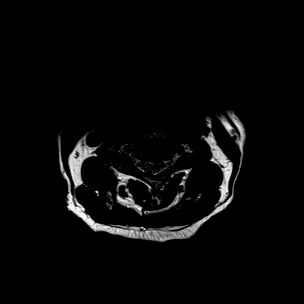
[im 34/40]
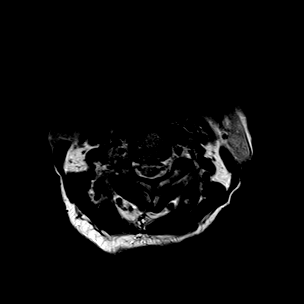
[im 40/40]
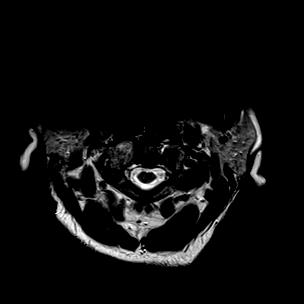

[Series 13: GRE · axial · 3.0mm · 0.39mm/px · z∈[-236,-115]mm · 8 of 40 slices shown]
[im 1/40]
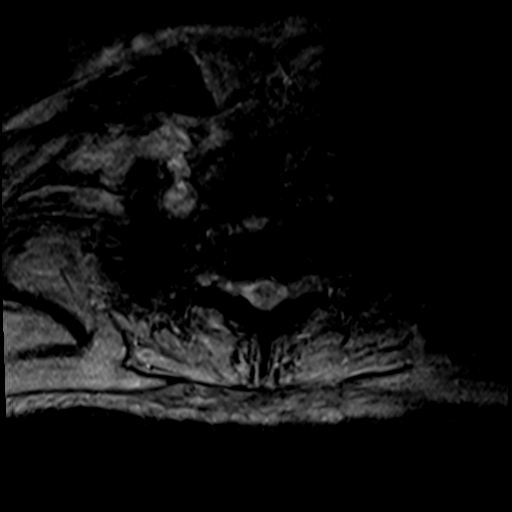
[im 6/40]
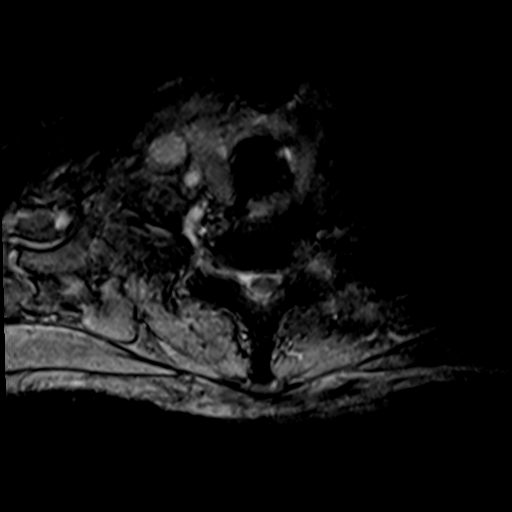
[im 12/40]
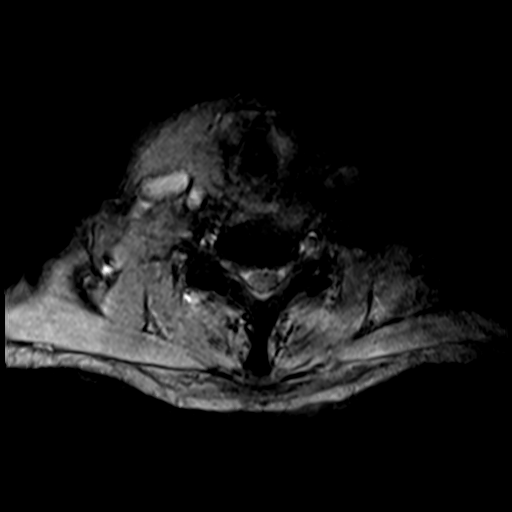
[im 17/40]
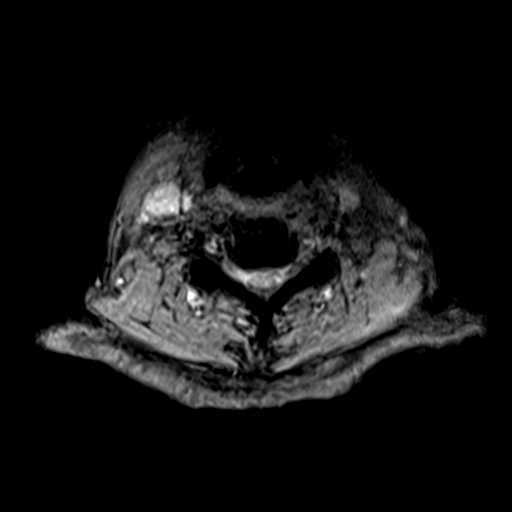
[im 23/40]
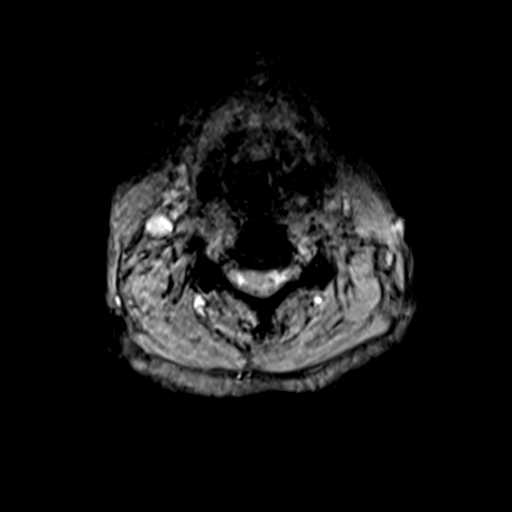
[im 28/40]
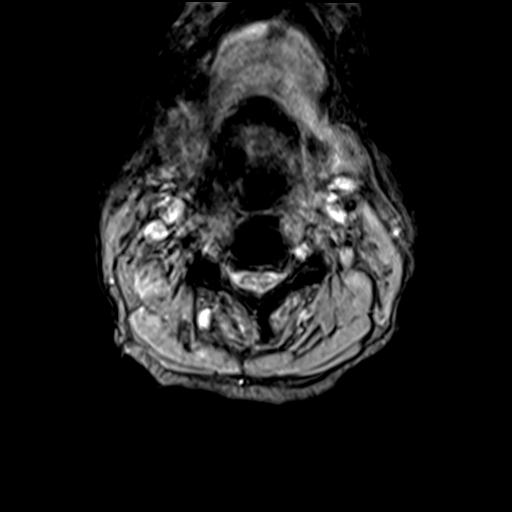
[im 34/40]
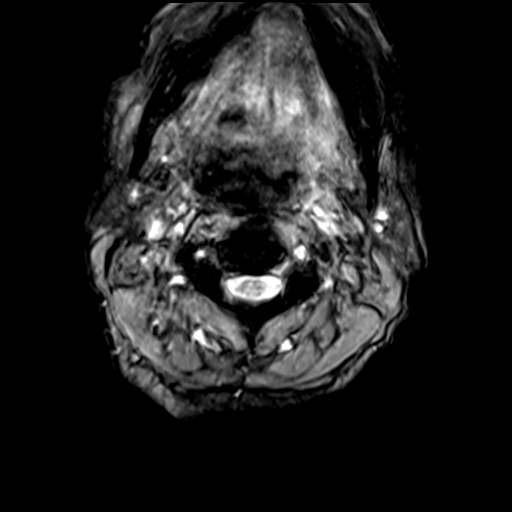
[im 40/40]
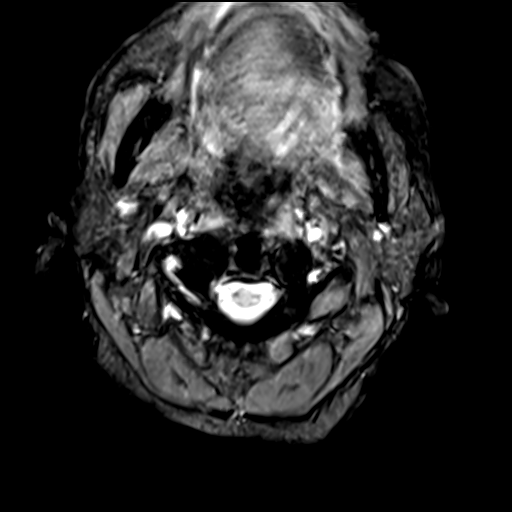

[37 of 48 positions shown; findings below may reference images not displayed]

FINDINGS: Alignment: Straightening of lordosis. Congenital spinal canal
narrowing.

Vertebrae: Normal bone marrow signal intensity. No focal osseous
lesion.

Cord: T2 hyperintense cord signal at the C5-6 level.

Posterior Fossa, vertebral arteries: Negative.

Disc levels: Multilevel desiccation.

C2-3: Disc osteophyte complex with superimposed right
subarticular/foraminal protrusion. Uncovertebral and facet
hypertrophy. Mild spinal canal and right neural foraminal narrowing.
Patent left neural foramen.

C3-4: Small disc osteophyte complex with shallow central protrusion,
uncovertebral and facet hypertrophy. Mild spinal canal and bilateral
neural foraminal narrowing.

C4-5: Disc osteophyte complex with shallow right foraminal
protrusion. Uncovertebral and facet degenerative spurring. Mild
spinal canal, moderate right and mild left neural foraminal
narrowing.

C5-6: Disc osteophyte complex with superimposed left subarticular
protrusion. Uncovertebral and facet degenerative spurring. Moderate
spinal canal and bilateral neural foraminal narrowing.

C6-7: Disc osteophyte complex with uncovertebral and facet
hypertrophy. Prominent ligamentum flavum. Moderate spinal canal,
severe right and moderate left neural foraminal narrowing.

C7-T1: No significant disc bulge. Uncovertebral and facet
degenerative spurring. Mild spinal canal and bilateral neural
foraminal narrowing.

Paraspinal tissues: Negative.
IMPRESSION: Congenital spinal canal narrowing with superimposed spondylosis.

Moderate spinal canal narrowing at the C5-6, C6-7 levels. C5-6
myelomalacia.

Moderate to severe right C4-5, bilateral C5-7 neural foraminal
narrowing.

## 2020-02-24 NOTE — Progress Notes (Signed)
Program ICD MRI mode AOO 75 per direction of pacer rep and PA

## 2020-02-25 DIAGNOSIS — J449 Chronic obstructive pulmonary disease, unspecified: Secondary | ICD-10-CM | POA: Diagnosis not present

## 2020-03-01 ENCOUNTER — Ambulatory Visit (HOSPITAL_COMMUNITY): Payer: PPO | Admitting: Hematology

## 2020-03-04 DIAGNOSIS — D500 Iron deficiency anemia secondary to blood loss (chronic): Secondary | ICD-10-CM | POA: Diagnosis not present

## 2020-03-04 LAB — HEMOGLOBIN AND HEMATOCRIT, BLOOD
HCT: 31.4 % — ABNORMAL LOW (ref 38.5–50.0)
Hemoglobin: 10.4 g/dL — ABNORMAL LOW (ref 13.2–17.1)

## 2020-03-07 ENCOUNTER — Ambulatory Visit (INDEPENDENT_AMBULATORY_CARE_PROVIDER_SITE_OTHER): Payer: Medicare Other

## 2020-03-07 ENCOUNTER — Other Ambulatory Visit (INDEPENDENT_AMBULATORY_CARE_PROVIDER_SITE_OTHER): Payer: Self-pay | Admitting: *Deleted

## 2020-03-07 DIAGNOSIS — D500 Iron deficiency anemia secondary to blood loss (chronic): Secondary | ICD-10-CM

## 2020-03-07 DIAGNOSIS — I469 Cardiac arrest, cause unspecified: Secondary | ICD-10-CM

## 2020-03-07 DIAGNOSIS — I4901 Ventricular fibrillation: Secondary | ICD-10-CM | POA: Diagnosis not present

## 2020-03-07 LAB — CUP PACEART REMOTE DEVICE CHECK
Battery Remaining Longevity: 156 mo
Battery Remaining Percentage: 100 %
Brady Statistic RA Percent Paced: 45 %
Brady Statistic RV Percent Paced: 0 %
Date Time Interrogation Session: 20220110023400
HighPow Impedance: 69 Ohm
Implantable Lead Implant Date: 20210709
Implantable Lead Implant Date: 20210709
Implantable Lead Location: 753859
Implantable Lead Location: 753860
Implantable Lead Model: 137
Implantable Lead Model: 7841
Implantable Lead Serial Number: 1063971
Implantable Lead Serial Number: 300935
Implantable Pulse Generator Implant Date: 20210709
Lead Channel Impedance Value: 479 Ohm
Lead Channel Impedance Value: 764 Ohm
Lead Channel Setting Pacing Amplitude: 2 V
Lead Channel Setting Pacing Amplitude: 2.2 V
Lead Channel Setting Pacing Pulse Width: 0.4 ms
Lead Channel Setting Sensing Sensitivity: 0.5 mV
Pulse Gen Serial Number: 224993

## 2020-03-10 DIAGNOSIS — Z23 Encounter for immunization: Secondary | ICD-10-CM | POA: Diagnosis not present

## 2020-03-15 NOTE — Patient Instructions (Signed)
Peter Lambert  03/15/2020     '@PREFPERIOPPHARMACY'$ @   Your procedure is scheduled on  03/18/2020  Report to [facility] at  [place].M.  Call this number if you have problems the morning of surgery:  [phone]   Remember:  Follow the diet and prep instructions given to you by the office.                     Take these medicines the morning of surgery with A SIP OF WATER  Amiodarone, carvedilol, isosorbide. Use your inhaler before you come. Bring your rescue inhaler with you.    Do not wear jewelry, make-up or nail polish.  Do not wear lotions, powders, or perfumes, or deodorant. Please brush your teeth.  Do not shave 48 hours prior to surgery.  Men may shave face and neck.  Do not bring valuables to the hospital.  [facility] is not responsible for any belongings or valuables.  Contacts, dentures or bridgework may not be worn into surgery.  Leave your suitcase in the car.  After surgery it may be brought to your room.  For patients admitted to the hospital, discharge time will be determined by your treatment team.  Patients discharged the day of surgery will not be allowed to drive home.   Name and phone number of your driver:   Family   Special instructions:  DO NOT smoke the morning of your procedure.  Please read over the following fact sheets that you were given. Anesthesia Post-op Instructions and Care and Recovery After Surgery       Colonoscopy, Adult, Care After This sheet gives you information about how to care for yourself after your procedure. Your health care provider may also give you more specific instructions. If you have problems or questions, contact your health care provider. What can I expect after the procedure? After the procedure, it is common to have:  A small amount of blood in your stool for 24 hours after the procedure.  Some gas.  Mild cramping or bloating of your abdomen. Follow these instructions at home: Eating and  drinking  Drink enough fluid to keep your urine pale yellow.  Follow instructions from your health care provider about eating or drinking restrictions.  Resume your normal diet as instructed by your health care provider. Avoid heavy or fried foods that are hard to digest.   Activity  Rest as told by your health care provider.  Avoid sitting for a long time without moving. Get up to take short walks every 1-2 hours. This is important to improve blood flow and breathing. Ask for help if you feel weak or unsteady.  Return to your normal activities as told by your health care provider. Ask your health care provider what activities are safe for you. Managing cramping and bloating  Try walking around when you have cramps or feel bloated.  Apply heat to your abdomen as told by your health care provider. Use the heat source that your health care provider recommends, such as a moist heat pack or a heating pad. ? Place a towel between your skin and the heat source. ? Leave the heat on for 20-30 minutes. ? Remove the heat if your skin turns bright red. This is especially important if you are unable to feel pain, heat, or cold. You may have a greater risk of getting burned.   General instructions  If you were given a  sedative during the procedure, it can affect you for several hours. Do not drive or operate machinery until your health care provider says that it is safe.  For the first 24 hours after the procedure: ? Do not sign important documents. ? Do not drink alcohol. ? Do your regular daily activities at a slower pace than normal. ? Eat soft foods that are easy to digest.  Take over-the-counter and prescription medicines only as told by your health care provider.  Keep all follow-up visits as told by your health care provider. This is important. Contact a health care provider if:  You have blood in your stool 2-3 days after the procedure. Get help right away if you have:  More than a  small spotting of blood in your stool.  Large blood clots in your stool.  Swelling of your abdomen.  Nausea or vomiting.  A fever.  Increasing pain in your abdomen that is not relieved with medicine. Summary  After the procedure, it is common to have a small amount of blood in your stool. You may also have mild cramping and bloating of your abdomen.  If you were given a sedative during the procedure, it can affect you for several hours. Do not drive or operate machinery until your health care provider says that it is safe.  Get help right away if you have a lot of blood in your stool, nausea or vomiting, a fever, or increased pain in your abdomen. This information is not intended to replace advice given to you by your health care provider. Make sure you discuss any questions you have with your health care provider. Document Revised: 02/06/2019 Document Reviewed: 09/08/2018 Elsevier Patient Education  2021 [place] After This sheet gives you information about how to care for yourself after your procedure. Your health care provider may also give you more specific instructions. If you have problems or questions, contact your health care provider. What can I expect after the procedure? After the procedure, it is common to have:  Tiredness.  Forgetfulness about what happened after the procedure.  Impaired judgment for important decisions.  Nausea or vomiting.  Some difficulty with balance. Follow these instructions at home: For the time period you were told by your health care provider:  Rest as needed.  Do not participate in activities where you could fall or become injured.  Do not drive or use machinery.  Do not drink alcohol.  Do not take sleeping pills or medicines that cause drowsiness.  Do not make important decisions or sign legal documents.  Do not take care of children on your own.      Eating and drinking  Follow the  diet that is recommended by your health care provider.  Drink enough fluid to keep your urine pale yellow.  If you vomit: ? Drink water, juice, or soup when you can drink without vomiting. ? Make sure you have little or no nausea before eating solid foods. General instructions  Have a responsible adult stay with you for the time you are told. It is important to have someone help care for you until you are awake and alert.  Take over-the-counter and prescription medicines only as told by your health care provider.  If you have sleep apnea, surgery and certain medicines can increase your risk for breathing problems. Follow instructions from your health care provider about wearing your sleep device: ? Anytime you are sleeping, including during daytime naps. ? While taking  prescription pain medicines, sleeping medicines, or medicines that make you drowsy.  Avoid smoking.  Keep all follow-up visits as told by your health care provider. This is important. Contact a health care provider if:  You keep feeling nauseous or you keep vomiting.  You feel light-headed.  You are still sleepy or having trouble with balance after 24 hours.  You develop a rash.  You have a fever.  You have redness or swelling around the IV site. Get help right away if:  You have trouble breathing.  You have new-onset confusion at home. Summary  For several hours after your procedure, you may feel tired. You may also be forgetful and have poor judgment.  Have a responsible adult stay with you for the time you are told. It is important to have someone help care for you until you are awake and alert.  Rest as told. Do not drive or operate machinery. Do not drink alcohol or take sleeping pills.  Get help right away if you have trouble breathing, or if you suddenly become confused. This information is not intended to replace advice given to you by your health care provider. Make sure you discuss any questions  you have with your health care provider. Document Revised: 10/29/2019 Document Reviewed: 01/15/2019 Elsevier Patient Education  2021 [employer].

## 2020-03-16 ENCOUNTER — Other Ambulatory Visit: Payer: Self-pay

## 2020-03-16 ENCOUNTER — Other Ambulatory Visit (HOSPITAL_COMMUNITY)
Admission: RE | Admit: 2020-03-16 | Discharge: 2020-03-16 | Disposition: A | Discharge status: Home / Self Care | Payer: Medicare Other | Source: Ambulatory Visit | Attending: Gastroenterology | Admitting: Gastroenterology

## 2020-03-16 ENCOUNTER — Encounter (HOSPITAL_COMMUNITY): Payer: Self-pay

## 2020-03-16 ENCOUNTER — Encounter (HOSPITAL_COMMUNITY)
Admission: RE | Admit: 2020-03-16 | Discharge: 2020-03-16 | Disposition: A | Discharge status: Home / Self Care | Payer: Medicare Other | Source: Ambulatory Visit | Attending: Gastroenterology | Admitting: Gastroenterology

## 2020-03-16 DIAGNOSIS — Z20822 Contact with and (suspected) exposure to covid-19: Secondary | ICD-10-CM | POA: Insufficient documentation

## 2020-03-16 DIAGNOSIS — Z01812 Encounter for preprocedural laboratory examination: Secondary | ICD-10-CM | POA: Diagnosis not present

## 2020-03-16 DIAGNOSIS — D510 Vitamin B12 deficiency anemia due to intrinsic factor deficiency: Secondary | ICD-10-CM | POA: Diagnosis not present

## 2020-03-16 LAB — SARS CORONAVIRUS 2 (TAT 6-24 HRS): SARS Coronavirus 2: NEGATIVE

## 2020-03-16 NOTE — Progress Notes (Signed)
Heber from Dr. Colman Cater office to call patient about holding Eliquis prior to procedure.

## 2020-03-17 ENCOUNTER — Encounter (HOSPITAL_COMMUNITY): Payer: Self-pay | Admitting: Anesthesiology

## 2020-03-18 ENCOUNTER — Encounter (HOSPITAL_COMMUNITY): Admission: RE | Payer: Self-pay | Source: Home / Self Care

## 2020-03-18 ENCOUNTER — Ambulatory Visit (HOSPITAL_COMMUNITY): Admission: RE | Admit: 2020-03-18 | Payer: Medicare Other | Source: Home / Self Care | Admitting: Gastroenterology

## 2020-03-18 SURGERY — COLONOSCOPY WITH PROPOFOL
Anesthesia: Monitor Anesthesia Care

## 2020-03-20 NOTE — Progress Notes (Signed)
Remote ICD transmission.   

## 2020-03-21 DIAGNOSIS — D500 Iron deficiency anemia secondary to blood loss (chronic): Secondary | ICD-10-CM | POA: Diagnosis not present

## 2020-03-22 LAB — HEMOGLOBIN AND HEMATOCRIT, BLOOD
HCT: 30.7 % — ABNORMAL LOW (ref 38.5–50.0)
Hemoglobin: 10.2 g/dL — ABNORMAL LOW (ref 13.2–17.1)

## 2020-03-26 DIAGNOSIS — I129 Hypertensive chronic kidney disease with stage 1 through stage 4 chronic kidney disease, or unspecified chronic kidney disease: Secondary | ICD-10-CM | POA: Diagnosis not present

## 2020-03-26 DIAGNOSIS — E1122 Type 2 diabetes mellitus with diabetic chronic kidney disease: Secondary | ICD-10-CM | POA: Diagnosis not present

## 2020-03-26 DIAGNOSIS — F17210 Nicotine dependence, cigarettes, uncomplicated: Secondary | ICD-10-CM | POA: Diagnosis not present

## 2020-03-26 DIAGNOSIS — J449 Chronic obstructive pulmonary disease, unspecified: Secondary | ICD-10-CM | POA: Diagnosis not present

## 2020-03-29 DIAGNOSIS — I10 Essential (primary) hypertension: Secondary | ICD-10-CM | POA: Diagnosis not present

## 2020-03-29 DIAGNOSIS — Z0000 Encounter for general adult medical examination without abnormal findings: Secondary | ICD-10-CM | POA: Diagnosis not present

## 2020-03-29 DIAGNOSIS — N182 Chronic kidney disease, stage 2 (mild): Secondary | ICD-10-CM | POA: Diagnosis not present

## 2020-03-29 DIAGNOSIS — I509 Heart failure, unspecified: Secondary | ICD-10-CM | POA: Diagnosis not present

## 2020-03-30 NOTE — Progress Notes (Signed)
Virtual Visit via Telephone Note   This visit type was conducted due to national recommendations for restrictions regarding the COVID-19 Pandemic (e.g. social distancing) in an effort to limit this patient's exposure and mitigate transmission in our community.  Due to his co-morbid illnesses, this patient is at least at moderate risk for complications without adequate follow up.  This format is felt to be most appropriate for this patient at this time.  The patient did not have access to video technology/had technical difficulties with video requiring transitioning to audio format only (telephone).  All issues noted in this document were discussed and addressed.  No physical exam could be performed with this format.  Please refer to the patient's chart for his  consent to telehealth for [facility].  Evaluation Performed:  Follow-up visit  This visit type was conducted due to national recommendations for restrictions regarding the COVID-19 Pandemic (e.g. social distancing).  This format is felt to be most appropriate for this patient at this time.  All issues noted in this document were discussed and addressed.  No physical exam was performed (except for noted visual exam findings with Video Visits).  Please refer to the patient's chart (MyChart message for video visits and phone note for telephone visits) for the patient's consent to telehealth for [place]  Date:  04/01/2020   ID:  Peter Lambert, DOB 11/02/1945, MRN [id number]  Patient Location:  [place] [address]   Provider location:     [place] [address] Office [phone] Fax [phone]   PCP:  Redmond School, MD  Cardiologist:  Quay Burow, MD  Electrophysiologist:  None   Chief Complaint: Follow-up for coronary artery disease/PAD  History of Present Illness:    Peter Lambert is a 75 y.o. male who presents via  audio/video conferencing for a telehealth visit today.  Patient verified DOB and address.  He has a PMH of V. fib arrest status post ICD implant 09/04/2019, NSTEMI, peripheral arterial disease, hypertension, hyperlipidemia, and coronary artery disease status post cardiac catheterization 05/2008 with PCI/DES to LAD.  First diagonal branch was jailed, angioplasty performed at ostium, which reduced 80% stenosis to less than 40%, LV function normal at that time.  Cardiac catheterization, 09/03/2019 which showed second marginal lesion 99% with unsuccessful attempt at PCI.  Medical management recommended.  An abdominal aortogram revealed a patent aortobifem bypass.  Previously noted to have occluded SFAs bilaterally with ABIs in the 0.5-0.6 range.  He was admitted to the hospital 04/23/2019 with acute respiratory failure.  He left AMA at that time but came back and was discharged on 04/28/2019.  He was noted to have community-acquired pneumonia, COPD exacerbation and diastolic dysfunction.  He was diuresed at that time from 175pounds to 161 pounds at discharge.  On follow-up he had gained a notable amount of weight.  The importance of sodium restriction and dietary modification were reviewed.  He continued on 3 L nasal cannula.  He was admitted to the hospital August 29, 2019 with community acquired pneumonia and had V. fib arrest which was witnessed and resuscitated.  He underwent LHC by Dr. Haroldine Laws revealing a patent LAD stent and occluded second marginal branch which Dr. Angelena Form was unable to intervene on.  His initial EF was 20% which improved to normal at the time of cardiac catheterization.  He was also noted to have brief atrial fibrillation/flutter and was placed on amiodarone and Eliquis.  He was last seen by Dr. Gwenlyn Found on September 30, 2019.  During that time he maintained sinus rhythm.  He had stopped smoking.  He denied chest pain shortness of breath.  He was seen by Dr. Lovena Le on December 15, 2019.  During that  time he continued to do well.  His ICD was functioning normally and he was continued on his amiodarone.  He is seen virtually today in follow-up and states he feels well.  He continues to do his yard work, housework, and stay physically active.  He reports that he tries to maintain a low-sodium diet and has not adding any extra table salt to his food.  He reports that his PCP started him on metoprolol tartrate a few months ago.  He is also taking carvedilol.  His blood pressure today is 127/72 with a heart rate of 68.  He denies any dizzy spells, low heart rates, and lightheadedness.  I will discontinue his metoprolol and increase his carvedilol to 6.25 mg twice daily.  We reviewed the medications and he expressed understanding.  I will give him the salty 6 diet sheet, have him continue his physical activity, and follow-up in 6 months.  Today he denies chest pain, shortness of breath, lower extremity edema, fatigue, palpitations, melena, hematuria, hemoptysis, diaphoresis, weakness, presyncope, syncope, orthopnea, and PND.   The patient does not symptoms concerning for COVID-19 infection (fever, chills, cough, or new SHORTNESS OF BREATH).    Prior CV studies:   The following studies were reviewed today:  Echocardiogram 08/30/2019  IMPRESSIONS    1. Left ventricular ejection fraction, by estimation, is 20%. The left  ventricle has severely decreased function. The left ventricle demonstrates  global hypokinesis, more prominent inferior/posterior walls. There is  moderate left ventricular  hypertrophy. Left ventricular diastolic parameters are consistent with  Grade II diastolic dysfunction (pseudonormalization). Slow flow and stasis  noted by Definity contrast in LV particularly at apex. Although no formed  thrombus, there is significant  substrate for thrombus formation.  2. RV-RA gradient 25 mmHg. Right ventricular systolic function is  severely reduced. The right ventricular size is  normal.  3. The mitral valve is grossly normal, mildly thickened. Mild mitral  valve regurgitation.  4. The aortic valve is tricuspid. Aortic valve regurgitation is not  visualized. Mild aortic valve sclerosis is present, with no evidence of  aortic valve stenosis.  5. Unable to estimate CVP.  Cardiac catheterization 09/03/2019  Ost RCA to Prox RCA lesion is 100% stenosed.  Previously placed Prox LAD to Mid LAD stent (unknown type) is widely patent.  1st Diag lesion is 90% stenosed.  Ost LAD lesion is 30% stenosed.  Prox Cx to Mid Cx lesion is 50% stenosed.  Ost Cx to Prox Cx lesion is 40% stenosed.  2nd Mrg lesion is 99% stenosed.   Assessment: 1.Significant CAD with left dominant system 2. Separate ostia for LAD & LCx 3. LAD with widely patent proximal sten. Mild non-obstructive CAD 4. LCX large dominant vessel with moderate diffuse disease. Culprit lesion appears to be dissected OM-2 branch with 99% lesion 5. RCA small non-dominant that is totally occluded 6. LVEF recovered at 55% 7. Well-compensated hemodynamics.  Plan/Discussion:  Suspect culprit lesion is OM-2 will attempt PCI if amenable. Otherwise medical therapy. If unable to revascularize, consider LifeVest on d/c.    Diagnostic Dominance: Left    Intervention    Past Medical History:  Diagnosis Date  . Coronary artery disease    a. s/p NSTEMI  in 2010 with DES to proximal LAD with D1 jailed and angioplasty alone to ostium  . Hyperlipidemia   . Hypertension   . Hypertension   . Peripheral arterial disease ([place])   . Tobacco abuse   . Type 2 diabetes mellitus ([place])    Past Surgical History:  Procedure Laterality Date  . CARDIAC SURGERY    . COLONOSCOPY WITH PROPOFOL N/A 01/08/2020   Procedure: COLONOSCOPY WITH PROPOFOL;  Surgeon: Eloise [name], DO;  [facility];  Service: Endoscopy;  Laterality: N/A;  . CORONARY STENT INTERVENTION N/A 09/03/2019   Procedure: CORONARY STENT  INTERVENTION;  Surgeon: Burnell Blanks, MD;  Location: [place] CV LAB;  Service: Cardiovascular;  Laterality: N/A;  . CORONARY STENT PLACEMENT    . ESOPHAGOGASTRODUODENOSCOPY (EGD) WITH PROPOFOL N/A 01/06/2020   Procedure: ESOPHAGOGASTRODUODENOSCOPY (EGD) WITH PROPOFOL;  Surgeon: Rogene Houston, MD;  [facility]: [facility];  Service: Endoscopy;  Laterality: N/A;  . ICD IMPLANT N/A 09/04/2019   Procedure: ICD IMPLANT;  Surgeon: Evans Lance, MD;  Location: [place] CV LAB;  Service: Cardiovascular;  Laterality: N/A;  . RIGHT/LEFT HEART CATH AND CORONARY ANGIOGRAPHY N/A 09/03/2019   Procedure: RIGHT/LEFT HEART CATH AND CORONARY ANGIOGRAPHY;  Surgeon: Jolaine Artist, MD;  Location: [place] CV LAB;  Service: Cardiovascular;  Laterality: N/A;     Current Meds  Medication Sig  . acetaminophen (TYLENOL) 325 MG tablet Take 2 tablets (650 mg total) by mouth every 6 (six) hours as needed for mild pain, fever or headache (or Fever >/= 101).  Marland Kitchen albuterol (VENTOLIN HFA) 108 (90 Base) MCG/ACT inhaler Inhale 2 puffs into the lungs every 4 (four) hours as needed for wheezing or shortness of breath.  Marland Kitchen amiodarone (PACERONE) 200 MG tablet Take 1 tablet (200 mg total) by mouth daily.  Marland Kitchen aspirin EC 81 MG tablet Take 1 tablet (81 mg total) by mouth daily with breakfast.  . carvedilol (COREG) 3.125 MG tablet Take 3.125 mg by mouth 2 (two) times daily with a meal.  . cyanocobalamin (,VITAMIN B-12,) 1000 MCG/ML injection Inject 1,000 mcg into the muscle every 30 (thirty) days.  . ferrous sulfate (FERROUSUL) 325 (65 FE) MG tablet Take 1 tablet (325 mg total) by mouth daily with breakfast.  . fluocinonide cream (LIDEX) [id number] % Apply 1 application topically 2 (two) times daily as needed (rash).  . furosemide (LASIX) 20 MG tablet Take 20 mg by mouth daily.  Marland Kitchen guaiFENesin (MUCINEX) 600 MG 12 hr tablet Take 600 mg by mouth 2 (two) times daily as needed for cough or to loosen phlegm.  . isosorbide  mononitrate (IMDUR) 30 MG 24 hr tablet Take 30 mg by mouth daily.  Marland Kitchen losartan (COZAAR) 25 MG tablet Take 25 mg by mouth daily.  . metoprolol tartrate (LOPRESSOR) 25 MG tablet Take 25 mg by mouth daily.  . nitroGLYCERIN (NITROSTAT) 0.4 MG SL tablet PLACE 1 TABLET UNDER THE TONGUE EVERY 5 MINUTES AS NEEDED FOR CHEST PAIN (Patient taking differently: Place 0.4 mg under the tongue every 5 (five) minutes as needed. PLACE 1 TABLET UNDER THE TONGUE EVERY 5 MINUTES AS NEEDED FOR CHEST PAIN)  . omeprazole (PRILOSEC) 20 MG capsule Take 20 mg by mouth daily.  Marland Kitchen spironolactone (ALDACTONE) 50 MG tablet Take 50 mg by mouth daily.  . traZODone (DESYREL) 50 MG tablet Take 50 mg by mouth at bedtime.  . vitamin B-12 1000 MCG tablet Take 1 tablet (1,000 mcg total) by mouth daily.  Marland Kitchen warfarin (  COUMADIN) 5 MG tablet Take 5 mg by mouth daily.     Allergies:   Patient has no known allergies.   Social History   Tobacco Use  . Smoking status: Former Smoker    Packs/day: 0.50    Years: 52.00    Pack years: 26.00    Types: Cigarettes  . Smokeless tobacco: Never Used  Vaping Use  . Vaping Use: Never used  Substance Use Topics  . Alcohol use: No  . Drug use: No     Family Hx: [name] family history includes Cancer in his mother. There is no history of Colon cancer or Stomach cancer.  ROS:   Please see the history of present illness.     All other systems reviewed and are negative.   Labs/Other Tests and Data Reviewed:    Recent Labs: 04/20/2019: TSH 0.905; TSH 0.926 08/29/2019: B Natriuretic Peptide 202.2 01/07/2020: Magnesium 2.1 02/04/2020: ALT 18; BUN 21; Creatinine, Ser 1.71; Potassium 4.4; Sodium 135 02/16/2020: Platelets 288 03/21/2020: Hemoglobin 10.2   Recent Lipid Panel Lab Results  Component Value Date/Time   CHOL 123 10/01/2019 09:12 AM   TRIG 72 10/01/2019 09:12 AM   HDL 47 10/01/2019 09:12 AM   CHOLHDL 2.6 10/01/2019 09:12 AM   CHOLHDL 7.4 06/02/2008 09:40 AM   LDLCALC 61  10/01/2019 09:12 AM    Wt Readings from Last 3 Encounters:  04/01/20 171 lb (77.6 kg)  03/16/20 164 lb (74.4 kg)  02/08/20 165 lb (74.8 kg)     Exam:    Vital Signs:  BP 127/72   Pulse 68   Wt 171 lb (77.6 kg)   BMI 26.00 kg/m    Well nourished, well developed male in no  acute distress.   ASSESSMENT & PLAN:    1.  Peripheral arterial disease-denies increased claudication frequency.  Underwent aortobifem bypass grafting 17 years ago.  Noted to have known occluded SFAs bilaterally with ABIs that run around 0.5-0.6 range. Continue Imdur, aspirin Heart healthy low-sodium high-fiber diet Increase physical activity as tolerated  Coronary artery disease-no chest pain today. Cardiac catheterization 05/2008 with PCI/DES to LAD.  First diagonal branch was jailed, angioplasty performed at ostium, which reduced 80% stenosis to less than 40%, LV function normal at that time.  Cardiac catheterization, 09/03/2019 which showed second marginal lesion 99% with unsuccessful attempt at PCI.  Medical management recommended.  Continue aspirin, carvedilol, Imdur, nitroglycerin Heart healthy low-sodium diet-salty 6 given Increase physical activity as tolerated  Essential hypertension-BP today 127/72.  Well-controlled at home.  Reports that his PCP had also placed him on metoprolol.  I would discontinue metoprolol at this time.  We reviewed his medications.  He expressed understanding. Continue Imdur, losartan, amiodarone Increase carvedilol to 6.25 twice daily Heart healthy low-sodium diet-salty 6 given Increase physical activity as tolerated  Hyperlipidemia-LDL 61 on 10/01/2019. Continue aspirin Heart healthy low-sodium high-fiber diet Increase physical activity as tolerated  Atrial flutter-heart rate today 68.  Denies bleeding issues Continue aspirin, Eliquis A low-sodium diet Increase physical activity as tolerated  History of VF arrest/chronic systolic and diastolic CHF -denies recent  irregular heartbeats or elevated heart rates.  Status post ICD insertion. Continue amiodarone Follows with EP  Disposition: Follow-up with Dr. Gwenlyn Found in 6 months.  COVID-19 Education: The signs and symptoms of COVID-19 were discussed with the patient and how to seek care for testing (follow up with PCP or arrange E-visit).  The importance of social distancing was discussed today.  Patient Risk:  After full review of this patients clinical status, I feel that they are at least moderate risk at this time.  Time:   Today, I have spent 10 minutes with the patient with telehealth technology discussing diet, exercise, medication, past medical history.  I spent greater than 20 minutes reviewing his past medical history, medications, and previous cardiac test.   Medication Adjustments/Labs and Tests Ordered: Current medicines are reviewed at length with the patient today.  Concerns regarding medicines are outlined above.   Tests Ordered: No orders of the defined types were placed in this encounter.  Medication Changes: No orders of the defined types were placed in this encounter.   Disposition:  in 6 month(s)  Signed, Jossie Ng. Accalia Rigdon NP-C    09/30/2018 11:58 AM    [address] [address] Office [phone] Fax [phone]

## 2020-04-01 ENCOUNTER — Telehealth (INDEPENDENT_AMBULATORY_CARE_PROVIDER_SITE_OTHER): Payer: Medicare Other | Admitting: General Practice

## 2020-04-01 ENCOUNTER — Encounter: Payer: Self-pay | Admitting: General Practice

## 2020-04-01 ENCOUNTER — Telehealth: Payer: Self-pay | Admitting: *Deleted

## 2020-04-01 ENCOUNTER — Encounter (INDEPENDENT_AMBULATORY_CARE_PROVIDER_SITE_OTHER): Payer: Self-pay

## 2020-04-01 ENCOUNTER — Telehealth: Payer: Self-pay

## 2020-04-01 VITALS — BP 127/72 | HR 68 | Wt 171.0 lb

## 2020-04-01 DIAGNOSIS — I10 Essential (primary) hypertension: Secondary | ICD-10-CM

## 2020-04-01 DIAGNOSIS — Z006 Encounter for examination for normal comparison and control in clinical research program: Secondary | ICD-10-CM

## 2020-04-01 DIAGNOSIS — E782 Mixed hyperlipidemia: Secondary | ICD-10-CM

## 2020-04-01 DIAGNOSIS — I4901 Ventricular fibrillation: Secondary | ICD-10-CM

## 2020-04-01 DIAGNOSIS — I2510 Atherosclerotic heart disease of native coronary artery without angina pectoris: Secondary | ICD-10-CM | POA: Diagnosis not present

## 2020-04-01 DIAGNOSIS — I739 Peripheral vascular disease, unspecified: Secondary | ICD-10-CM | POA: Diagnosis not present

## 2020-04-01 DIAGNOSIS — I469 Cardiac arrest, cause unspecified: Secondary | ICD-10-CM | POA: Diagnosis not present

## 2020-04-01 DIAGNOSIS — I5033 Acute on chronic diastolic (congestive) heart failure: Secondary | ICD-10-CM

## 2020-04-01 DIAGNOSIS — I4892 Unspecified atrial flutter: Secondary | ICD-10-CM

## 2020-04-01 MED ORDER — CARVEDILOL 6.25 MG PO TABS: 6.2500 mg | ORAL_TABLET | Freq: Two times a day (BID) | ORAL | 6 refills | Status: DC

## 2020-04-01 NOTE — Telephone Encounter (Signed)
Subject Name: Peter Lambert  Subject met inclusion and exclusion criteria.  The informed consent form, study requirements and expectations were reviewed with the subject and questions and concerns were addressed prior to the signing of the consent form.  The subject verbalized understanding of the trial requirements.  The subject agreed to participate in the [facility] trial and signed the informed consent at 1430 on 04/01/2020  The informed consent was obtained prior to performance of any protocol-specific procedures for the subject.  A copy of the signed informed consent was mailed to the subject and a copy was placed in the subject's medical record.   The consent was obtained verbally via phone.   [name]

## 2020-04-01 NOTE — Telephone Encounter (Addendum)
COORDINATE-Diabetes BASELINE CASE REPORT FORM (Intervention) Site #:  [id number]              Patient ID: [id number]   INCLUSION CRITERIA  1. Is patient 18 years or older? [] No   [x] Yes  2. Based on the clinical record, does the patient have a history of type 2 diabetes mellitus? [] No   [x] Yes  3. Based on the clinical record, does the patient have a history of atherosclerotic cardiovascular disease, as defined by at least one of the clinical criteria below? [] No   [x] Yes   IF YES: Coronary Artery Disease Prior myocardial infarction Prior coronary artery bypass graft surgery Prior percutaneous coronary intervention Documented obstructive (i.e. ?50%) coronary artery disease (by angiography or CT) [] No  [x] Yes [x] No  [] Yes [] No  [x] Yes [x] No  [] Yes IF YES: date of MOST RECENT event: 01/JAN/2010   Cerebrovascular Disease Prior ischemic stroke Carotid artery stenosis (?50%) [x] No []  Yes [x] No []  Yes IF YES: date of MOST RECENT event:          / /          mm dd yyyy   Peripheral Arterial Disease Prior peripheral revascularization Prior amputation due to poor circulation History of Claudication with Ankle-brachial index <0.9 [] No [x]  Yes [x] No []  Yes [] No [x]  Yes IF YES: date of MOST RECENT event: 01/JAN/2005  4. Patient's baseline guideline-based therapy score:    1 point Currently prescribed angiotensin converting enzyme inhibitor (ACEi), angiotensin receptor blocker (ARB) or Angiotensin Receptor Neprilysin inhibitor (ARNi) therapy [] No [x]  Yes   1 point Currently prescribed high intensity statin therapy (atorvastatin 40-80mg daily OR rosuvastatin 20-40mg daily) [x] No []  Yes   1 point Most recent HbA1c <7% on metformin monotherapy? [x] No []  Yes    Calculated score:    EXCLUSION CRITERIA  REVIEW EACH CONDITION AND SELECT YES IF THE STATEMENT IS TRUE OR NO IF THE STATEMENT IS FALSE.  1. Determined to be highly unlikely to survive and/or to continue follow-up in that clinic for at least 1  year, as identified by site investigator [x] No [] Yes  2. eGFR ? 30 ml/min/1.34m [x] No [] Yes  3. Baseline composite score of 3 for guideline recommended therapy [x] No [] Yes  4. Absolute contraindication to any of the 3 guideline recommended therapies    ACEi AND ARB therapy [] No [x]  Yes    High intensity statin therapy [x] No  [] Yes    SGLT2I AND GLP1RA therapy [x] No  [] Yes   5. Already taking SGLT2i or GLP1RA therapy at baseline [x] No [] Yes     Subject Name: Peter Lambert  Subject met inclusion and exclusion criteria.  The informed consent form, study requirements and expectations were reviewed with the subject and questions and concerns were addressed prior to the signing of the consent form.  The subject verbalized understanding of the trial requirements.  The subject agreed to participate in the [facility] trial and signed the informed consent on 04/01/2020 AT 1430. The informed consent was obtained prior to performance of any protocol-specific procedures for the subject.  A copy of the signed informed consent was given to the subject and a copy was placed in the subject's medical record.   [place]  Other Cardiovascular  Atrial Fibrillation or Atrial Flutter [] No [x] Yes  Hypertension [] No [x] Yes  Hyperlipidemia [] No [x] Yes  Cigarette smoking [x] Current [] Former [] Never  Non-Cardiovascular  Anemia (Hgb<lower limit normal) [] No [x] Yes  Asthma [x] No [] Yes  Chronic lung disease [] No [x] Yes  Cancer -  leukemia, lymphoma, or localized solid tumor [x] No [] Yes  Cancer - metastatic solid tumor [x] No [] Yes  Mild Liver Disease (Cirrhosis without portal hypertension) [x] No [] Yes  Moderate/Severe Liver Disease (Cirrhosis with portal hypertension) [x] No [] Yes  Connective Tissue Disease [x] No [] Yes  Dementia [x] No [] Yes  Depression [x] No [] Yes  Hemiplegia or paraplegia [x] No [] Yes  Human immunodeficiency virus [x] No [] Yes  Obstructive Sleep Apnea [x] No  [] Yes  Peptic Ulcer Disease [x] No [] Yes  Renal insufficiency [x] No [] Yes  Drug or Alcohol Abuse [] Current [] Former [x] Never  Diabetes History  Year of diagnosis of type 2 diabetes                               2014   History of diabetic ketoacidosis? [x] No [] Yes   Are any of the following complications of diabetes present (Select all that apply): [] Retinopathy [] Neuropathy [] Foot complications      (including ulcers, calluses,         amputation) [] Gastroparesis [x] None of the above        [] No [x] Yes  Subject educated on Coordinate diabetes and all educational materials given to subject.

## 2020-04-01 NOTE — Patient Instructions (Signed)
Medication Instructions:  STOP METOPROLOL  INCREASE CARVEDILOL 6.'25MG'$  TWICE DAILY *If you need a refill on your cardiac medications before your next appointment, please call your pharmacy*  Lab Work:   Testing/Procedures:  NONE    NONE  Special Instructions  PLEASE READ AND FOLLOW SALTY 6-ATTACHED-1,'800mg'$  daily  PLEASE MAINTAIN PHYSICAL ACTIVITY AS TOLERATED  Follow-Up: Your next appointment:  6 month(s) In Person with Quay Burow, MD OR IF UNAVAILABLE JESSE CLEAVER, FNP-C   Please call our office 2 months in advance to schedule this appointment   At [facility], you and your health needs are our priority.  As part of our continuing mission to provide you with exceptional heart care, we have created designated Provider Care Teams.  These Care Teams include your primary Cardiologist (physician) and Advanced Practice Providers (APPs -  Physician Assistants and Nurse Practitioners) who all work together to provide you with the care you need, when you need it.  We recommend signing up for the patient portal called "MyChart".  Sign up information is provided on this After Visit Summary.  MyChart is used to connect with patients for Virtual Visits (Telemedicine).  Patients are able to view lab/test results, encounter notes, upcoming appointments, etc.  Non-urgent messages can be sent to your provider as well.   To learn more about what you can do with MyChart, go to [email or web address].              6 SALTY THINGS TO AVOID     1,'800MG'$  DAILY

## 2020-04-06 ENCOUNTER — Ambulatory Visit (INDEPENDENT_AMBULATORY_CARE_PROVIDER_SITE_OTHER): Payer: Medicare Other | Admitting: Internal Medicine

## 2020-04-06 ENCOUNTER — Encounter (INDEPENDENT_AMBULATORY_CARE_PROVIDER_SITE_OTHER): Payer: Self-pay | Admitting: Internal Medicine

## 2020-04-06 ENCOUNTER — Telehealth (INDEPENDENT_AMBULATORY_CARE_PROVIDER_SITE_OTHER): Payer: Self-pay | Admitting: *Deleted

## 2020-04-06 NOTE — Telephone Encounter (Signed)
Patient was a no show today 04/06/2020 to see Dr.Rehman.

## 2020-04-11 ENCOUNTER — Other Ambulatory Visit: Payer: Self-pay

## 2020-04-11 ENCOUNTER — Telehealth (INDEPENDENT_AMBULATORY_CARE_PROVIDER_SITE_OTHER): Payer: Self-pay

## 2020-04-11 ENCOUNTER — Encounter (INDEPENDENT_AMBULATORY_CARE_PROVIDER_SITE_OTHER): Payer: Self-pay

## 2020-04-11 ENCOUNTER — Encounter (INDEPENDENT_AMBULATORY_CARE_PROVIDER_SITE_OTHER): Payer: Self-pay | Admitting: Gastroenterology

## 2020-04-11 ENCOUNTER — Other Ambulatory Visit (INDEPENDENT_AMBULATORY_CARE_PROVIDER_SITE_OTHER): Payer: Self-pay

## 2020-04-11 ENCOUNTER — Ambulatory Visit (INDEPENDENT_AMBULATORY_CARE_PROVIDER_SITE_OTHER): Payer: Medicare Other | Admitting: Gastroenterology

## 2020-04-11 VITALS — BP 128/66 | HR 73 | Temp 98.1°F | Ht 68.0 in | Wt 173.0 lb

## 2020-04-11 DIAGNOSIS — K5903 Drug induced constipation: Secondary | ICD-10-CM

## 2020-04-11 DIAGNOSIS — D500 Iron deficiency anemia secondary to blood loss (chronic): Secondary | ICD-10-CM

## 2020-04-11 NOTE — Patient Instructions (Signed)
Start taking Miralax 1 cupful every 12 hours. If after two weeks there is no improvement, increase to 1 cup every 8 hours Schedule EGD and colonoscopy] Continue oral iron

## 2020-04-11 NOTE — Telephone Encounter (Signed)
Routing to PharmD for recommendations re: anticoagulation. Richardson Dopp, PA-C    04/11/2020 5:51 PM

## 2020-04-11 NOTE — Telephone Encounter (Signed)
Opened in Error.

## 2020-04-11 NOTE — Progress Notes (Signed)
[name], M.D. Gastroenterology & Hepatology [facility] [address] [place], [place] [address] Primary Care Physician: [employer], [place] [phone]  Problems: 1. Iron deficiency anemia  History of Present Illness: Peter Lambert is a 75 y.o. male with past medical history of coronary artery disease status post stent placement, hypertension, hyperlipidemia, afib on coumadin, diabetes, peripheral artery disease, who presents for follow up of iron deficiency anemia.  The patient was last seen in clinic on 02/08/2020.  At that time the patient was scheduled to have a colonoscopy for evaluation of any other vascular lesions that could explain his anemia.  He had a very small AVM in his upper endoscopy previously described below.  Was also advised to start the MiraLAX to improve his constipation, as well as to continue taking oral iron supplementation.  His most recent hemoglobin was 10.2 on 03/21/2020.  Unfortunately, the patient canceled his colonoscopy and did not reschedule it.  The patient denies having any complaints.  States he has to strain to have bowel movements once or twice per week but he is taking MiraLAX 1 cap every day without any improvement.  Has not required any blood transfusions.  Denies having any nausea, vomiting, fever, chills, hematochezia, melena, hematemesis, abdominal distention, abdominal pain, diarrhea, jaundice, pruritus or weight loss.  He reports feeling well and is active, able to exert himself.  Last EGD: 01/06/2020, presence of possible extrinsic compression of the mid esophagus, presence of a medium sized paraesophageal hernia, a single nonbleeding AVM was found in the duodenum. Last Colonoscopy: 01/06/2020 Presence of diverticulosis, preparation was inadequate due to the amount of stool, hemorrhoids were found, recommended to have repeat colonoscopy in 3 months.  Past  Medical History: Past Medical History:  Diagnosis Date  . Coronary artery disease    a. s/p NSTEMI in 2010 with DES to proximal LAD with D1 jailed and angioplasty alone to ostium  . Hyperlipidemia   . Hypertension   . Hypertension   . Peripheral arterial disease ([place])   . Tobacco abuse   . Type 2 diabetes mellitus ([place])     Past Surgical History: Past Surgical History:  Procedure Laterality Date  . CARDIAC SURGERY    . COLONOSCOPY WITH PROPOFOL N/A 01/08/2020   Procedure: COLONOSCOPY WITH PROPOFOL;  Surgeon: Eloise [name], DO;  [facility];  Service: Endoscopy;  Laterality: N/A;  . CORONARY STENT INTERVENTION N/A 09/03/2019   Procedure: CORONARY STENT INTERVENTION;  Surgeon: Burnell Blanks, MD;  Location: [place] [facility] LAB;  Service: Cardiovascular;  Laterality: N/A;  . CORONARY STENT PLACEMENT    . ESOPHAGOGASTRODUODENOSCOPY (EGD) WITH PROPOFOL N/A 01/06/2020   Procedure: ESOPHAGOGASTRODUODENOSCOPY (EGD) WITH PROPOFOL;  Surgeon: Rogene Houston, MD;  Location[facility];  Service: Endoscopy;  Laterality: N/A;  . ICD IMPLANT N/A 09/04/2019   Procedure: ICD IMPLANT;  Surgeon: Evans Lance, MD;  Location: [place] CV LAB;  Service: Cardiovascular;  Laterality: N/A;  . RIGHT/LEFT HEART CATH AND CORONARY ANGIOGRAPHY N/A 09/03/2019   Procedure: RIGHT/LEFT HEART CATH AND CORONARY ANGIOGRAPHY;  Surgeon: Jolaine Artist, MD;  Location: [place] CV LAB;  Service: Cardiovascular;  Laterality: N/A;    Family History: Family History  Problem Relation Age of Onset  . Cancer Mother        not sure location   . Colon cancer Neg Hx   . Stomach cancer Neg Hx     Social  History: Social History   Tobacco Use  Smoking Status Former Smoker  . Packs/day: 0.50  . Years: 52.00  . Pack years: 26.00  . Types: Cigarettes  Smokeless Tobacco Never Used   Social History   Substance and Sexual Activity  Alcohol Use No   Social History   Substance and  Sexual Activity  Drug Use No    Allergies: No Known Allergies  Medications: Current Outpatient Medications  Medication Sig Dispense Refill  . acetaminophen (TYLENOL) 325 MG tablet Take 2 tablets (650 mg total) by mouth every 6 (six) hours as needed for mild pain, fever or headache (or Fever >/= 101). 30 tablet 0  . albuterol (VENTOLIN HFA) 108 (90 Base) MCG/ACT inhaler Inhale 2 puffs into the lungs every 4 (four) hours as needed for wheezing or shortness of breath. 18 g 3  . amiodarone (PACERONE) 200 MG tablet Take 1 tablet (200 mg total) by mouth daily.    Marland Kitchen aspirin EC 81 MG tablet Take 1 tablet (81 mg total) by mouth daily with breakfast. 30 tablet 2  . carvedilol (COREG) 6.25 MG tablet Take 1 tablet (6.25 mg total) by mouth 2 (two) times daily with a meal. 30 tablet 6  . cyanocobalamin (,VITAMIN B-12,) 1000 MCG/ML injection Inject 1,000 mcg into the muscle every 30 (thirty) days.    . ferrous sulfate (FERROUSUL) 325 (65 FE) MG tablet Take 1 tablet (325 mg total) by mouth daily with breakfast.  0  . fluocinonide cream (LIDEX) [id number] % Apply 1 application topically 2 (two) times daily as needed (rash).    . furosemide (LASIX) 20 MG tablet Take 20 mg by mouth daily.    . isosorbide dinitrate (ISORDIL) 30 MG tablet Take 30 mg by mouth daily.    Marland Kitchen losartan (COZAAR) 25 MG tablet Take 25 mg by mouth daily.    Marland Kitchen lubiprostone (AMITIZA) 8 MCG capsule Take 1 capsule (8 mcg total) by mouth 2 (two) times daily with a meal. 60 capsule 5  . nitroGLYCERIN (NITROSTAT) 0.4 MG SL tablet PLACE 1 TABLET UNDER THE TONGUE EVERY 5 MINUTES AS NEEDED FOR CHEST PAIN (Patient taking differently: Place 0.4 mg under the tongue every 5 (five) minutes as needed. PLACE 1 TABLET UNDER THE TONGUE EVERY 5 MINUTES AS NEEDED FOR CHEST PAIN) 25 tablet 3  . omeprazole (PRILOSEC) 20 MG capsule Take 20 mg by mouth daily.    Marland Kitchen spironolactone (ALDACTONE) 50 MG tablet Take 50 mg by mouth daily.    . traZODone (DESYREL) 50 MG tablet  Take 50 mg by mouth at bedtime.    . vitamin B-12 1000 MCG tablet Take 1 tablet (1,000 mcg total) by mouth daily. 30 tablet 2  . warfarin (COUMADIN) 5 MG tablet Take 5 mg by mouth daily.    Marland Kitchen guaiFENesin (MUCINEX) 600 MG 12 hr tablet Take 600 mg by mouth 2 (two) times daily as needed for cough or to loosen phlegm.     No current facility-administered medications for this visit.    Review of Systems: GENERAL: negative for malaise, night sweats HEENT: No changes in hearing or vision, no nose bleeds or other nasal problems. NECK: Negative for lumps, goiter, pain and significant neck swelling RESPIRATORY: Negative for cough, wheezing CARDIOVASCULAR: Negative for chest pain, leg swelling, palpitations, orthopnea GI: SEE HPI MUSCULOSKELETAL: Negative for joint pain or swelling, back pain, and muscle pain. SKIN: Negative for lesions, rash PSYCH: Negative for sleep disturbance, mood disorder and recent psychosocial stressors. HEMATOLOGY Negative for prolonged  bleeding, bruising easily, and swollen nodes. ENDOCRINE: Negative for cold or heat intolerance, polyuria, polydipsia and goiter. NEURO: negative for tremor, gait imbalance, syncope and seizures. The remainder of the review of systems is noncontributory.   Physical Exam: BP 128/66 (BP Location: Left Arm, Patient Position: Sitting, Cuff Size: Large)   Pulse 73   Temp 98.1 F (36.7 C) (Oral)   Ht '5\' 8"'$  (1.727 m)   Wt 173 lb (78.5 kg)   BMI 26.30 kg/m  GENERAL: The patient is AO x3, in no acute distress. HEENT: Head is normocephalic and atraumatic. EOMI are intact. Mouth is well hydrated and without lesions. NECK: Supple. No masses LUNGS: Clear to auscultation. No presence of rhonchi/wheezing/rales. Adequate chest expansion HEART: RRR, normal s1 and s2. ABDOMEN: Soft, nontender, no guarding, no peritoneal signs, and nondistended. BS +. No masses. EXTREMITIES: Without any cyanosis, clubbing, rash, lesions or edema. NEUROLOGIC: AOx3,  no focal motor deficit. SKIN: no jaundice, no rashes   Imaging/Labs: as above  I personally reviewed and interpreted the available labs, imaging and endoscopic files.  Impression and Plan: Peter Lambert is a 75 y.o. male with past medical history of coronary artery disease status post stent placement, hypertension, hyperlipidemia, afib on coumadin, diabetes, peripheral artery disease, who presents for follow up of iron deficiency anemia.  The patient seems to have had some response to his oral iron supplementation as his most recent hemoglobin has been stable, for which he should continue taking his oral iron at the current dose is currently taking.  Thus, we will proceed with a repeat EGD and colonoscopy for ablation of the AVM that was previously seen and for evaluation of other lesions in his colon.  If this investigations are not completely conclusive for the reason of his iron deficiency anemia, will need to proceed with a capsule endoscopy.  He is presenting constipation due to the intake of iron, he can increase his intake of MiraLAX to up to 3 capful per day. Patient understood and agreed.  - Increase Miralax 1 cupful every 12 hours. If after two weeks there is no improvement, increase to 1 cup every 8 hours - Schedule EGD and colonoscopy - Continue oral iron  All questions were answered.      [name], MD Gastroenterology and Hepatology [facility]

## 2020-04-11 NOTE — Telephone Encounter (Signed)
Mr Diles will be under going a Colonoscopy on 05/13/20 with Dr [name], would it be appropriate for him to stop his coumadin 2 days prior to his procedure, please advise?

## 2020-04-12 NOTE — Telephone Encounter (Signed)
Patient with diagnosis of atrial flutter on warfarin for anticoagulation.    Procedure: colonoscopy Date of procedure: 05/13/20   CHA2DS2-VASc Score = 6  This indicates a 9.7% annual risk of stroke. [name] score is based upon: CHF History: Yes HTN History: Yes Diabetes History: Yes Stroke History: No Vascular Disease History: Yes Age Score: 2 Gender Score: 0  CrCl 40.8 Platelet count 288  Per office protocol, patient can hold warfarin for 2 days prior to procedure.    Patient will not need bridging with Lovenox (enoxaparin) around procedure.

## 2020-04-12 NOTE — Telephone Encounter (Signed)
   Primary Cardiologist: Quay Burow, MD  See recommendations from the PharmD below.  Call with questions.  Richardson Dopp, PA-C 04/12/2020, 8:54 AM

## 2020-04-12 NOTE — Telephone Encounter (Signed)
Noted thank you

## 2020-04-14 ENCOUNTER — Ambulatory Visit (INDEPENDENT_AMBULATORY_CARE_PROVIDER_SITE_OTHER): Payer: Medicare Other | Admitting: Internal Medicine

## 2020-04-18 DIAGNOSIS — D510 Vitamin B12 deficiency anemia due to intrinsic factor deficiency: Secondary | ICD-10-CM | POA: Diagnosis not present

## 2020-04-24 ENCOUNTER — Other Ambulatory Visit (INDEPENDENT_AMBULATORY_CARE_PROVIDER_SITE_OTHER): Payer: Self-pay | Admitting: Gastroenterology

## 2020-04-24 ENCOUNTER — Other Ambulatory Visit: Payer: Self-pay | Admitting: General Practice

## 2020-04-24 DIAGNOSIS — D500 Iron deficiency anemia secondary to blood loss (chronic): Secondary | ICD-10-CM

## 2020-04-26 ENCOUNTER — Other Ambulatory Visit: Payer: Self-pay

## 2020-04-26 ENCOUNTER — Encounter (HOSPITAL_COMMUNITY): Payer: Self-pay | Admitting: Emergency Medicine

## 2020-04-26 ENCOUNTER — Emergency Department (HOSPITAL_COMMUNITY)
Admission: EM | Admit: 2020-04-26 | Discharge: 2020-04-27 | Disposition: A | Discharge status: Home / Self Care | Payer: PPO | Attending: Emergency Medicine | Admitting: Emergency Medicine

## 2020-04-26 ENCOUNTER — Emergency Department (HOSPITAL_COMMUNITY): Payer: PPO

## 2020-04-26 DIAGNOSIS — K922 Gastrointestinal hemorrhage, unspecified: Secondary | ICD-10-CM | POA: Diagnosis not present

## 2020-04-26 DIAGNOSIS — I10 Essential (primary) hypertension: Secondary | ICD-10-CM | POA: Diagnosis not present

## 2020-04-26 DIAGNOSIS — J449 Chronic obstructive pulmonary disease, unspecified: Secondary | ICD-10-CM | POA: Insufficient documentation

## 2020-04-26 DIAGNOSIS — Z87891 Personal history of nicotine dependence: Secondary | ICD-10-CM | POA: Insufficient documentation

## 2020-04-26 DIAGNOSIS — Z7982 Long term (current) use of aspirin: Secondary | ICD-10-CM | POA: Insufficient documentation

## 2020-04-26 DIAGNOSIS — R531 Weakness: Secondary | ICD-10-CM | POA: Diagnosis not present

## 2020-04-26 DIAGNOSIS — Z79899 Other long term (current) drug therapy: Secondary | ICD-10-CM | POA: Diagnosis not present

## 2020-04-26 DIAGNOSIS — I2510 Atherosclerotic heart disease of native coronary artery without angina pectoris: Secondary | ICD-10-CM | POA: Insufficient documentation

## 2020-04-26 DIAGNOSIS — D649 Anemia, unspecified: Secondary | ICD-10-CM

## 2020-04-26 DIAGNOSIS — E119 Type 2 diabetes mellitus without complications: Secondary | ICD-10-CM | POA: Diagnosis not present

## 2020-04-26 DIAGNOSIS — I5041 Acute combined systolic (congestive) and diastolic (congestive) heart failure: Secondary | ICD-10-CM | POA: Insufficient documentation

## 2020-04-26 DIAGNOSIS — Z7901 Long term (current) use of anticoagulants: Secondary | ICD-10-CM | POA: Diagnosis not present

## 2020-04-26 DIAGNOSIS — I110 Hypertensive heart disease with heart failure: Secondary | ICD-10-CM | POA: Insufficient documentation

## 2020-04-26 DIAGNOSIS — K5900 Constipation, unspecified: Secondary | ICD-10-CM | POA: Diagnosis not present

## 2020-04-26 LAB — COMPREHENSIVE METABOLIC PANEL
ALT: 17 U/L (ref 0–44)
AST: 16 U/L (ref 15–41)
Albumin: 3.5 g/dL (ref 3.5–5.0)
Alkaline Phosphatase: 42 U/L (ref 38–126)
Anion gap: 7 (ref 5–15)
BUN: 53 mg/dL — ABNORMAL HIGH (ref 8–23)
CO2: 24 mmol/L (ref 22–32)
Calcium: 8.5 mg/dL — ABNORMAL LOW (ref 8.9–10.3)
Chloride: 106 mmol/L (ref 98–111)
Creatinine, Ser: 1.76 mg/dL — ABNORMAL HIGH (ref 0.61–1.24)
GFR, Estimated: 40 mL/min — ABNORMAL LOW (ref >60)
Glucose, Bld: 114 mg/dL — ABNORMAL HIGH (ref 70–99)
Potassium: 5.1 mmol/L (ref 3.5–5.1)
Sodium: 137 mmol/L (ref 135–145)
Total Bilirubin: 0.3 mg/dL (ref 0.3–1.2)
Total Protein: 6.4 g/dL — ABNORMAL LOW (ref 6.5–8.1)

## 2020-04-26 LAB — POC OCCULT BLOOD, ED: Fecal Occult Bld: POSITIVE — AB

## 2020-04-26 LAB — CBC WITH DIFFERENTIAL/PLATELET
Abs Immature Granulocytes: 0.28 10*3/uL — ABNORMAL HIGH (ref 0.00–0.07)
Basophils Absolute: 0.1 10*3/uL (ref 0.0–0.1)
Basophils Relative: 1 %
Eosinophils Absolute: 0.2 10*3/uL (ref 0.0–0.5)
Eosinophils Relative: 2 %
HCT: 19.5 % — ABNORMAL LOW (ref 39.0–52.0)
Hemoglobin: 6.1 g/dL — CL (ref 13.0–17.0)
Immature Granulocytes: 3 %
Lymphocytes Relative: 14 %
Lymphs Abs: 1.3 10*3/uL (ref 0.7–4.0)
MCH: 30.7 pg (ref 26.0–34.0)
MCHC: 31.3 g/dL (ref 30.0–36.0)
MCV: 98 fL (ref 80.0–100.0)
Monocytes Absolute: 1.1 10*3/uL — ABNORMAL HIGH (ref 0.1–1.0)
Monocytes Relative: 11 %
Neutro Abs: 6.7 10*3/uL (ref 1.7–7.7)
Neutrophils Relative %: 69 %
Platelets: 169 10*3/uL (ref 150–400)
RBC: 1.99 MIL/uL — ABNORMAL LOW (ref 4.22–5.81)
RDW: 17.7 % — ABNORMAL HIGH (ref 11.5–15.5)
WBC: 9.5 10*3/uL (ref 4.0–10.5)
nRBC: 0.7 % — ABNORMAL HIGH (ref 0.0–0.2)

## 2020-04-26 LAB — PROTIME-INR
INR: 6.2 (ref 0.8–1.2)
Prothrombin Time: 53.4 seconds — ABNORMAL HIGH (ref 11.4–15.2)

## 2020-04-26 LAB — PREPARE RBC (CROSSMATCH)

## 2020-04-26 LAB — TROPONIN I (HIGH SENSITIVITY): Troponin I (High Sensitivity): 23 ng/L — ABNORMAL HIGH (ref <18)

## 2020-04-26 IMAGING — DX DG CHEST 1V PORT
1 series · 1 of 1 positions shown · non-contrast
Comparison: [DATE]

CLINICAL DATA: Weakness for 2 hours

EXAM:
PORTABLE CHEST 1 VIEW

[chest ap]
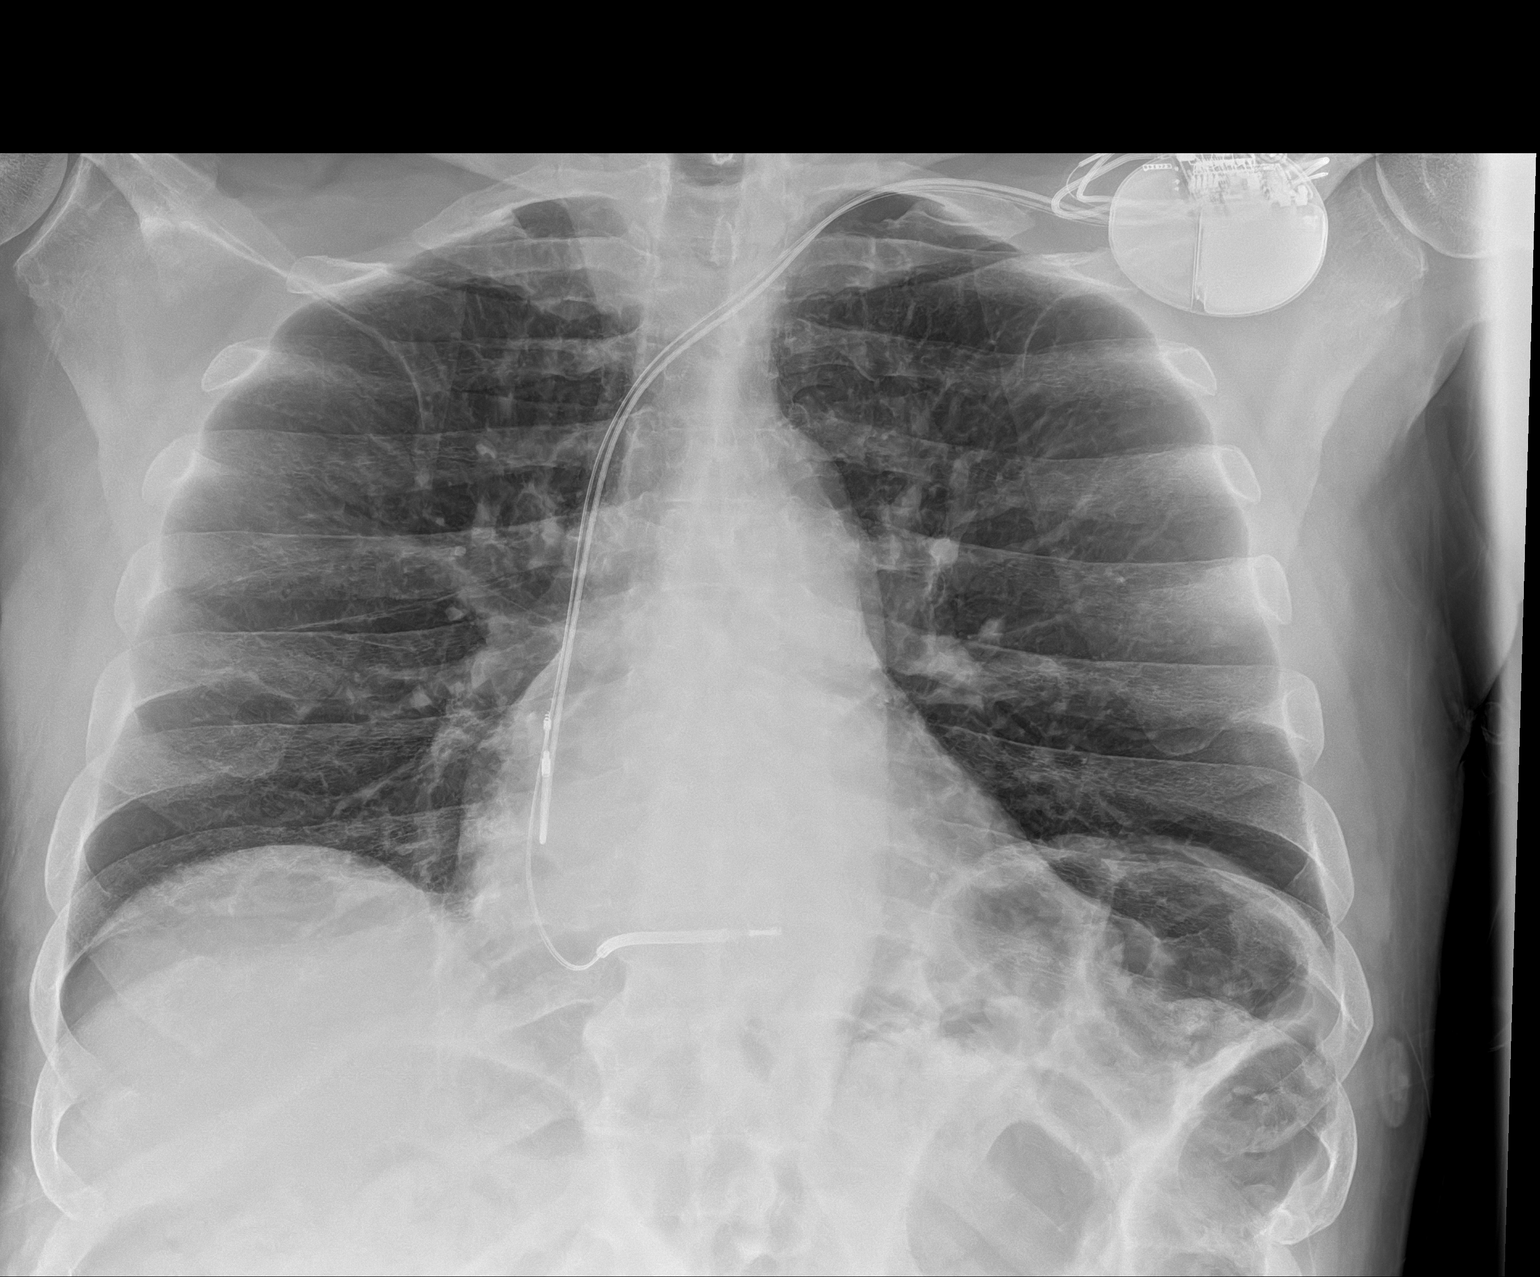

[1 of 1 positions shown; findings below may reference images not displayed]

FINDINGS: Single frontal view of the chest demonstrates dual lead pacer
unchanged. Cardiac silhouette is stable. No airspace disease,
effusion, or pneumothorax.
IMPRESSION: 1. Stable chest, no acute process.

## 2020-04-26 MED ORDER — SODIUM CHLORIDE 0.9% IV SOLUTION: Freq: Once | INTRAVENOUS | Status: DC

## 2020-04-26 MED ORDER — PANTOPRAZOLE SODIUM 40 MG IV SOLR
40.0000 mg | Freq: Once | INTRAVENOUS | Status: AC
  Administered 2020-04-26: 40 mg via INTRAVENOUS
  Filled 2020-04-26: qty 40

## 2020-04-26 NOTE — ED Notes (Signed)
Date and time results received: 04/26/20 6:37 PM  (use smartphrase ".now" to insert current time)  Test: INR Critical Value: 6.2  Name of Provider Notified: Almyra Free idol, PA  Orders Received? Or Actions Taken?: PA notified

## 2020-04-26 NOTE — ED Triage Notes (Signed)
Pt c/o weakness that started x 2 hours ago. States that he was getting out of his truck and his legs felt weak. States the last time he felt like this his hemoglobin was low.

## 2020-04-26 NOTE — ED Notes (Signed)
Date and time results received: 04/26/20 1742 (use smartphrase ".now" to insert current time)  Test: hgb Critical Value: 6.1  Name of Provider Notified: [email or web address]  Orders Received? Or Actions Taken?:

## 2020-04-26 NOTE — ED Provider Notes (Signed)
[facility] EMERGENCY DEPARTMENT Provider Note   CSN: [id number] Arrival date & time: 04/26/20  1456     History Chief Complaint  Patient presents with  . Weakness    Peter Lambert is a 75 y.o. male with a history of CAD, prior nstemi, HTN, PAD, Type 2 DM and iron deficient anemia, presenting with increasing generalized weakness along with an episode of left sided arm and leg shakiness and weakness which lasted for 5 minutes this am when he was attempting to get out of his truck. He states the last time he had this sx he was found to have worsening anemia requiring a blood transfusion.  He is currently on coumadin for h/o afib, switched from Eliquis due to cost.  He is not taking asa or nsaids.  He denies cp, sob, unusual bleeding or bruising and denies blood in stools, although states he doesn't always pay attention to his bm's. He also denies abdominal pain, no n/v, no GERD sx.   Endorses constipation which is relieved by exlax.  He is scheduled for colonoscopy with Dr. Alfonso Ramus on 3/18. Prior EGD 01/06/20 revealed non bleeding small duodenal AVM.  Prepping inadequate for colonoscopy, hence reason for upcoming colonoscopy.     HPI     Past Medical History:  Diagnosis Date  . Coronary artery disease    a. s/p NSTEMI in 2010 with DES to proximal LAD with D1 jailed and angioplasty alone to ostium  . Hyperlipidemia   . Hypertension   . Hypertension   . Peripheral arterial disease ([place])   . Tobacco abuse   . Type 2 diabetes mellitus [facility])     Patient Active Problem List   Diagnosis Date Noted  . Iron deficiency anemia 01/19/2020  . Constipation 01/19/2020  . Paraesophageal hernia   . Accidental fall 01/05/2020  . Prolonged QT interval 01/05/2020  . Atrial fibrillation, chronic ([place]) 01/05/2020  . GI bleed 01/04/2020  . Orthostatic hypotension 01/04/2020  . Acute blood loss anemia 01/04/2020  . Generalized weakness 01/04/2020  . Hyponatremia 01/04/2020  . Hyperkalemia  01/04/2020  . CAD (coronary artery disease) 01/04/2020  . HFrEF (heart failure with reduced ejection fraction) ([place]) 01/04/2020  . ICD (implantable cardioverter-defibrillator) in place 12/15/2019  . Acute renal failure ([address]) 09/02/2019  . Hypokalemia 09/02/2019  . Hyperglycemia 09/02/2019  . Hypoalbuminemia 09/02/2019  . Elevated brain natriuretic peptide (BNP) level 09/02/2019  . Lactic acidosis 09/02/2019  . Leukocytosis 09/02/2019  . Proteinuria 09/02/2019  . Hypomagnesemia 09/02/2019  . NSTEMI (non-ST elevated myocardial infarction) ([place]) 09/02/2019  . CKD (chronic kidney disease), stage III b 09/02/2019  . PVC's (premature ventricular contractions) 09/02/2019  . NSVT (nonsustained ventricular tachycardia) ([place]) 09/02/2019  . Thrombocytopenia ([place]) 09/02/2019  . Anemia 09/02/2019  . Severe sepsis with septic shock ([place]) suspected on admission, source: ? aspiration 09/02/2019  . Acute metabolic encephalopathy [id number]  . Acute systolic CHF (congestive heart failure) ([place]) 09/02/2019  . Anticoagulated 09/02/2019  . History of noncompliance with medical treatment 09/02/2019  . Cardiac arrest with ventricular fibrillation ([place])   . Acute on chronic respiratory failure with hypoxia ([place]) 04/23/2019  . Lobar pneumonia ([place]) 04/22/2019  . Aspiration pneumonia ([place]), bilateral   . Demand ischemia of myocardium ([place])   . Atrial flutter ([place])   . Acute on chronic diastolic heart failure ([place])   . Acute respiratory failure with hypoxemia ([place]) 07/02/2016  . COPD (chronic obstructive pulmonary disease) ([place]) 07/02/2016  . Tobacco use 07/17/2013  . Peripheral arterial  disease ([place]) 12/03/2012  . Coronary artery disease s/p DES to LAD in 2010, s/p PCI to proximal LAD stenosis with DES this hospitalization 12/03/2012  . Essential hypertension 12/03/2012  . Hyperlipidemia 12/03/2012  . Type 2 diabetes mellitus ([place]) 12/03/2012    Past Surgical History:  Procedure Laterality Date  .  CARDIAC SURGERY    . COLONOSCOPY WITH PROPOFOL N/A 01/08/2020   Procedure: COLONOSCOPY WITH PROPOFOL;  Surgeon: Eloise [name], DO;  Location[facility] END[facility];  Service: Endoscopy;  Laterality: N/A;  . CORONARY STENT INTERVENTION N/A 09/03/2019   Procedure: CORONARY STENT INTERVENTION;  Surgeon: Burnell Blanks, MD;  Location: [place] CV LAB;  Service: Cardiovascular;  Laterality: N/A;  . CORONARY STENT PLACEMENT    . ESOPHAGOGASTRODUODENOSCOPY (EGD) WITH PROPOFOL N/A 01/06/2020   Procedure: ESOPHAGOGASTRODUODENOSCOPY (EGD) WITH PROPOFOL;  Surgeon: Rogene Houston, MD;  Location: [facility];  Service: Endoscopy;  Laterality: N/A;  . ICD IMPLANT N/A 09/04/2019   Procedure: ICD IMPLANT;  Surgeon: Evans Lance, MD;  Location: [place] CV LAB;  Service: Cardiovascular;  Laterality: N/A;  . RIGHT/LEFT HEART CATH AND CORONARY ANGIOGRAPHY N/A 09/03/2019   Procedure: RIGHT/LEFT HEART CATH AND CORONARY ANGIOGRAPHY;  Surgeon: Jolaine Artist, MD;  Location: [place] CV LAB;  Service: Cardiovascular;  Laterality: N/A;       Family History  Problem Relation Age of Onset  . Cancer Mother        not sure location   . Colon cancer Neg Hx   . Stomach cancer Neg Hx     Social History   Tobacco Use  . Smoking status: Former Smoker    Packs/day: 0.50    Years: 52.00    Pack years: 26.00    Types: Cigarettes  . Smokeless tobacco: Never Used  Vaping Use  . Vaping Use: Never used  Substance Use Topics  . Alcohol use: No  . Drug use: No    Home Medications Prior to Admission medications   Medication Sig Start Date End Date Taking? Authorizing Provider  acetaminophen (TYLENOL) 325 MG tablet Take 2 tablets (650 mg total) by mouth every 6 (six) hours as needed for mild pain, fever or headache (or Fever >/= 101). 04/28/19  Yes Emokpae, Courage, MD  albuterol (VENTOLIN HFA) 108 (90 Base) MCG/ACT inhaler Inhale 2 puffs into the lungs every 4 (four) hours as needed for wheezing or  shortness of breath. 04/28/19  Yes Emokpae, Courage, MD  amiodarone (PACERONE) 200 MG tablet Take 1 tablet (200 mg total) by mouth daily. 01/09/20  Yes Johnson, Clanford L, MD  aspirin EC 81 MG tablet Take 1 tablet (81 mg total) by mouth daily with breakfast. 04/28/19 04/27/20 Yes Emokpae, Courage, MD  atorvastatin (LIPITOR) 80 MG tablet Take 80 mg by mouth daily.   Yes [provider]  carvedilol (COREG) 6.25 MG tablet TAKE 1 TABLET BY MOUTH 2 TIMES DAILY WITH A MEAL. 04/25/20  Yes Cleaver, Jossie Ng, NP  cyanocobalamin (,VITAMIN B-12,) 1000 MCG/ML injection Inject 1,000 mcg into the muscle every 30 (thirty) days.   Yes [provider]  ferrous sulfate (FERROUSUL) 325 (65 FE) MG tablet Take 1 tablet (325 mg total) by mouth daily with breakfast. 01/19/20  Yes Rehman, Mechele Dawley, MD  fluocinonide cream (LIDEX) [id number] % Apply 1 application topically 2 (two) times daily as needed (rash). 12/23/19  Yes [provider]  furosemide (LASIX) 20 MG tablet Take 20 mg by mouth daily.   Yes [provider]  guaiFENesin (MUCINEX) 600 MG 12 hr tablet Take 600 mg by mouth 2 (two) times daily as needed for cough or to loosen phlegm.   Yes [provider]  isosorbide dinitrate (ISORDIL) 30 MG tablet Take 30 mg by mouth daily.   Yes [provider]  losartan (COZAAR) 25 MG tablet Take 25 mg by mouth daily.   Yes [provider]  lubiprostone (AMITIZA) 8 MCG capsule Take 1 capsule (8 mcg total) by mouth 2 (two) times daily with a meal. Patient taking differently: Take 8 mcg by mouth daily with breakfast. 01/19/20  Yes Rehman, Mechele Dawley, MD  Melatonin 3 MG CAPS Take 6 mg by mouth at bedtime.   Yes [provider]  nitroGLYCERIN (NITROSTAT) 0.4 MG SL tablet PLACE 1 TABLET UNDER THE TONGUE EVERY 5 MINUTES AS NEEDED FOR CHEST PAIN Patient taking differently: Place 0.4 mg under the tongue every 5 (five) minutes as needed. PLACE 1 TABLET UNDER THE TONGUE EVERY 5  MINUTES AS NEEDED FOR CHEST PAIN 11/26/19  Yes Lorretta Harp, MD  omeprazole (PRILOSEC) 20 MG capsule Take 20 mg by mouth daily.   Yes [provider]  spironolactone (ALDACTONE) 50 MG tablet Take 50 mg by mouth daily.   Yes [provider]  traZODone (DESYREL) 50 MG tablet Take 50 mg by mouth at bedtime.   Yes [provider]  vitamin B-12 1000 MCG tablet Take 1 tablet (1,000 mcg total) by mouth daily. 01/09/20  Yes Johnson, Clanford L, MD  warfarin (COUMADIN) 5 MG tablet Take 5 mg by mouth daily.   Yes [provider]  apixaban (ELIQUIS) 5 MG TABS tablet Eliquis 5 mg tablet  Take 1 tablet every day by oral route for 90 days. Patient not taking: No sig reported    [provider]  colchicine 0.6 MG tablet colchicine 0.6 mg tablet  Take 1 tablet every day by oral route for 90 days. Patient not taking: No sig reported    [provider]  magnesium citrate SOLN magnesium citrate oral solution Patient not taking: No sig reported    [provider]  metoprolol tartrate (LOPRESSOR) 25 MG tablet metoprolol tartrate 25 mg tablet Patient not taking: No sig reported    [provider]  potassium chloride SA (KLOR-CON) 20 MEQ tablet K[place]-Con M20 mEq tablet,extended release  Take 1 tablet every day by oral route for 90 days. Patient not taking: No sig reported 01/28/20   [provider]  potassium chloride (KLOR-CON) 20 MEQ packet TAKE 1 TABLET BY MOUTH EVERY DAY 12/24/12 07/14/13  Lorretta Harp, MD    Allergies    Patient has no known allergies.  Review of Systems   Review of Systems  Constitutional: Positive for fatigue. Negative for chills and fever.  HENT: Negative.   Eyes: Negative.   Respiratory: Negative for chest tightness and shortness of breath.   Cardiovascular: Negative for chest pain.  Gastrointestinal: Positive for constipation. Negative for abdominal pain, nausea, rectal pain and vomiting.   Genitourinary: Negative.   Musculoskeletal: Negative for arthralgias, joint swelling and neck pain.  Skin: Negative.  Negative for rash and wound.  Neurological: Positive for weakness. Negative for dizziness, light-headedness, numbness and headaches.  Psychiatric/Behavioral: Negative.   All other systems reviewed and are negative.   Physical Exam Updated Vital Signs BP (!) 109/53   Pulse 84   Temp 98.7 F (37.1 C) (Oral)   Resp (!) 22   Ht '5\' 8"'$  (1.727  m)   Wt 78.5 kg   SpO2 97%   BMI 26.31 kg/m   Physical Exam Vitals and nursing note reviewed.  Constitutional:      Appearance: He is well-developed and well-nourished.  HENT:     Head: Normocephalic and atraumatic.     Mouth/Throat:     Mouth: Mucous membranes are pale.  Eyes:     Comments: Pale conjunctiva.  Cardiovascular:     Rate and Rhythm: Normal rate and regular rhythm.     Pulses: Intact distal pulses.     Heart sounds: Normal heart sounds.  Pulmonary:     Effort: Pulmonary effort is normal.     Breath sounds: Normal breath sounds. No wheezing.  Abdominal:     General: Bowel sounds are normal.     Palpations: Abdomen is soft.     Tenderness: There is no abdominal tenderness.  Genitourinary:    Rectum: Guaiac result positive.     Comments: Strongly hemoccult positive, no visible blood in stool residue.  Musculoskeletal:        General: Normal range of motion.     Cervical back: Normal range of motion.  Skin:    General: Skin is warm and dry.  Neurological:     Mental Status: He is alert.  Psychiatric:        Mood and Affect: Mood and affect normal.     ED Results / Procedures / Treatments   Labs (all labs ordered are listed, but only abnormal results are displayed) Labs Reviewed  CBC WITH DIFFERENTIAL/PLATELET - Abnormal; Notable for the following components:      Result Value   RBC 1.99 (*)    Hemoglobin 6.1 (*)    HCT 19.5 (*)    RDW 17.7 (*)    nRBC 0.7 (*)    Monocytes Absolute 1.1 (*)     Abs Immature Granulocytes 0.28 (*)    All other components within normal limits  COMPREHENSIVE METABOLIC PANEL - Abnormal; Notable for the following components:   Glucose, Bld 114 (*)    BUN 53 (*)    Creatinine, Ser 1.76 (*)    Calcium 8.5 (*)    Total Protein 6.4 (*)    GFR, Estimated 40 (*)    All other components within normal limits  POC OCCULT BLOOD, ED - Abnormal; Notable for the following components:   Fecal Occult Bld POSITIVE (*)    All other components within normal limits  TROPONIN I (HIGH SENSITIVITY) - Abnormal; Notable for the following components:   Troponin I (High Sensitivity) 23 (*)    All other components within normal limits  PROTIME-INR  PREPARE RBC (CROSSMATCH)    EKG EKG Interpretation  Date/Time:  Tuesday April 26 2020 14:59:38 EST Ventricular Rate:  82 PR Interval:  158 QRS Duration: 100 QT Interval:  384 QTC Calculation: 448 R Axis:   43 Text Interpretation: Normal sinus rhythm ST & T wave abnormality, consider inferolateral ischemia Abnormal ECG Not paced? ?Atrial paced Confirmed by Fredia Sorrow [phone]) on 04/26/2020 3:26:40 PM   Radiology DG Chest Portable 1 View  Result Date: 04/26/2020 CLINICAL DATA:  Weakness for 2 hours EXAM: PORTABLE CHEST 1 VIEW COMPARISON:  01/04/2020 FINDINGS: Single frontal view of the chest demonstrates dual lead pacer unchanged. Cardiac silhouette is stable. No airspace disease, effusion, or pneumothorax. IMPRESSION: 1. Stable chest, no acute process. Electronically Signed   By: Randa Ngo M.D.   On: 04/26/2020 17:07    Procedures Procedures  Medications Ordered in ED Medications  0.9 %  sodium chloride infusion (Manually program via Guardrails IV Fluids) (has no administration in time range)  pantoprazole (PROTONIX) injection 40 mg (has no administration in time range)    ED Course  I have reviewed the triage vital signs and the nursing notes.  Pertinent labs & imaging results that were available  during my care of the patient were reviewed by me and considered in my medical decision making (see chart for details).    MDM Rules/Calculators/A&P                          Pt with significant acute on chronic iron deficiency anemia, hgb today is 6.1.  Pending colonoscopy with Dr. Alfonso Ramus this month.  Pt is unwilling to be admitted unless we are planning to do his colonoscopy - otherwise he will just follow up with Dr. Alfonso Ramus.  He is willing to receive a blood  transfusion here.    Discussed findings with Dr.Castenado - since pt is not actively bleeding, he will need at least 5 days off coumadin to undergo colonoscopy and he is currently awaiting approval from cardiology for him to come off his coumadin for this procedure.  Therefore, it does not make sense for pt to be admitted emergently. Would prefer pt receive 2 units of prbc's prior to dc home.    Pending PT/INR at this time.  2 units of prbc's ordered. Pt will need repeat H&H after 2nd unit before dc home.    Pt signed out to [employer], PA-C who assumes care.  Final Clinical Impression(s) / ED Diagnoses Final diagnoses:  Symptomatic anemia  Gastrointestinal hemorrhage, unspecified gastrointestinal hemorrhage type    Rx / DC Orders ED Discharge Orders    None       Landis Martins 04/26/20 Derrick Ravel, MD 04/26/20 [phone]

## 2020-04-26 NOTE — ED Provider Notes (Cosign Needed)
  Patient signed out to me by Evalee Jefferson, PA-C pending completion of work-up and reassessment.  Patient seen here for symptomatic anemia.  Currently taking Coumadin for atrial fibrillation.  Work-up today showed hemoglobin of 6.1.  Hemoccult positive stool.  GI was consulted and patient was ordered 2 units of blood to be transfused in the emergency department.  It was recommended patient be admitted, but he refused admission.  Stated that he will follow up with Dr. Jenetta Downer.  GI was consulted.  On recheck, patient remains adamant that he does not want to be admitted.  Has INR of 6.2  On recheck, patient resting comfortably no acute distress.  Requesting food and finger foods provided.  Currently being transfused first unit.  0010 patient rechecked again.  Second unit of blood initiated.  Denies complaints.  Abdomen is soft.  Blood pressure 99991111 systolic. Vitamin K ordered.  Discussed with oncoming provider, Dr. Wyvonnia Dusky who assumes care.      Kem Parkinson, PA-C 04/27/20 [id number]

## 2020-04-27 ENCOUNTER — Other Ambulatory Visit (INDEPENDENT_AMBULATORY_CARE_PROVIDER_SITE_OTHER): Payer: Self-pay

## 2020-04-27 ENCOUNTER — Telehealth (INDEPENDENT_AMBULATORY_CARE_PROVIDER_SITE_OTHER): Payer: Self-pay

## 2020-04-27 DIAGNOSIS — I482 Chronic atrial fibrillation, unspecified: Secondary | ICD-10-CM

## 2020-04-27 DIAGNOSIS — J449 Chronic obstructive pulmonary disease, unspecified: Secondary | ICD-10-CM | POA: Diagnosis not present

## 2020-04-27 DIAGNOSIS — D500 Iron deficiency anemia secondary to blood loss (chronic): Secondary | ICD-10-CM

## 2020-04-27 DIAGNOSIS — Z7901 Long term (current) use of anticoagulants: Secondary | ICD-10-CM

## 2020-04-27 LAB — HEMOGLOBIN AND HEMATOCRIT, BLOOD
HCT: 23.8 % — ABNORMAL LOW (ref 39.0–52.0)
Hemoglobin: 7.7 g/dL — ABNORMAL LOW (ref 13.0–17.0)

## 2020-04-27 LAB — PROTIME-INR
INR: 5.3 (ref 0.8–1.2)
Prothrombin Time: 47 seconds — ABNORMAL HIGH (ref 11.4–15.2)

## 2020-04-27 MED ORDER — PHYTONADIONE 5 MG PO TABS
5.0000 mg | ORAL_TABLET | Freq: Once | ORAL | Status: AC
  Administered 2020-04-27: 5 mg via ORAL
  Filled 2020-04-27: qty 1

## 2020-04-27 NOTE — Telephone Encounter (Signed)
Harvel Quale, MD  Rancour, Annie Main, MD; Sharmon Revere; Lorretta Harp, MD; Karle Barr, CMA Thanks, he uis scheduled for EGD/colonoscopy in mid March.  INR very elevated, wonder if he is a candidate for any other anticoagulant?  Kenny Rea, can you please send an order for CBC and INR in one week?   Thanks        Previous Messages   ----- Message -----  From: Ezequiel Essex, MD  Sent: 04/27/2020  7:12 AM EST  To: Lorretta Harp, MD, Liliane Shi, PA-C, *   In the ED with melena and hemoglobin 6.1 and INR 6. Known duodenal AVM. Refused to be admitted but agreed to vitamin k and transfusion. Encouraged him multiple times to stay but would not.  May benefit from labs checked this week.

## 2020-04-27 NOTE — Telephone Encounter (Signed)
Labs ordered at [facility] to be done around 05/04/2020. I have called the patient home number and left a vm to r/c, and have called his cell number and vm is full. Labs ordered in Epic.

## 2020-04-27 NOTE — ED Provider Notes (Signed)
Patient getting blood transfusions for symptomatic anemia.  His hemoglobin was 6.1 with heme positive stool and INR of 6.  Gastroenterology was consulted and recommended transfusion. He was given vitamin k.   Patient refusing to be admitted by report. Gastroenterology states colonoscopy will not be performed until patient can safely come off his Coumadin.  EGD in November showed a small duodenal AVM.  3:30 AM. Patient has completed blood transfusion and is resting comfortably with stable vital signs. He denies any weakness or dizziness. No abdominal pain.  Again medical admission was recommended given his severe anemia with elevated INR. He is adamant that he wants to go home. He appears to have capacity to leave [place]. He understands bleeding could recur which can be life-threatening  Hemoglobin has improved to 7.7, INR to 5.3.  He does not want further blood transfusions or FFP to reverse his INR. He is agreeable to an additional dose of vitamin k.  Patient remains adamant that he is going home and did not want to be admitted.  He will leave [place] and follow-up with his doctors  He appears to have capacity to leave [place] and understands his bleeding could worsen which could be life-threatening.   Ezequiel Essex, MD 04/27/20 [phone]

## 2020-04-27 NOTE — ED Notes (Signed)
Pt resting quietly in room with eyes closed, VSS, 2nd unit of blood infusing without difficulty.

## 2020-04-27 NOTE — ED Notes (Signed)
2nd unit of blood infusion complete, pt resting in bed, VSS. Remains on cardiac monitoring, placed on room air from 2L. EDP aware of blood completion.

## 2020-04-27 NOTE — ED Notes (Signed)
Date and time results received: 04/27/20 0539  Test: INR Critical Value: 5.3  Name of Provider Notified: Dr. Wyvonnia Dusky  Orders Received? Or Actions Taken?: NONE at this time

## 2020-04-27 NOTE — Discharge Instructions (Signed)
You are leaving [place].  Your hemoglobin is low and your INR is high.  Hold your Coumadin for 2 days and follow-up with your doctor.  Return to the ED if you change your mind about being admitted to the hospital.

## 2020-04-28 ENCOUNTER — Telehealth: Payer: Self-pay

## 2020-04-28 LAB — BPAM RBC
Blood Product Expiration Date: 202203282359
Blood Product Expiration Date: 202203282359
ISSUE DATE / TIME: 202203012134
ISSUE DATE / TIME: 202203020034
Unit Type and Rh: 5100
Unit Type and Rh: 5100

## 2020-04-28 LAB — TYPE AND SCREEN
ABO/RH(D): B POS
Antibody Screen: NEGATIVE
Unit division: 0
Unit division: 0

## 2020-04-28 NOTE — Telephone Encounter (Signed)
Spoke with Peter Lambert regarding recent ER visit. Pt states that he was seen in the ER because of fatigue and was found to have an INR of 6.2 and a hemoglobin of 6.1g/dL. Pt was given blood and vitamin k to help with blood levels at this time but was not agreeable to being admitted to the hospital. Per recommendations from Dr. Gwenlyn Found and PharmD Raquel would like to see Peter Lambert back in the office as soon as possible to recheck INR and blood work as well as re-establish pt on eliquis once bleeding has subsided. Pt able to make an appointment with the pharmacy for tomorrow (04/29/20) at 10:30am. Advised pt to not drive himself to his appointment tomorrow. Pt verbalizes understanding.

## 2020-04-29 ENCOUNTER — Encounter (HOSPITAL_COMMUNITY): Payer: Self-pay | Admitting: Emergency Medicine

## 2020-04-29 ENCOUNTER — Other Ambulatory Visit: Payer: Self-pay

## 2020-04-29 ENCOUNTER — Ambulatory Visit (INDEPENDENT_AMBULATORY_CARE_PROVIDER_SITE_OTHER): Payer: PPO

## 2020-04-29 ENCOUNTER — Inpatient Hospital Stay (HOSPITAL_COMMUNITY)
Admission: EM | Admit: 2020-04-29 | Discharge: 2020-05-03 | DRG: 378 | Disposition: A | Discharge status: Home / Self Care | Payer: PPO | Attending: Internal Medicine | Admitting: Internal Medicine

## 2020-04-29 ENCOUNTER — Telehealth: Payer: Self-pay | Admitting: Physician Assistant

## 2020-04-29 DIAGNOSIS — R140 Abdominal distension (gaseous): Secondary | ICD-10-CM | POA: Diagnosis present

## 2020-04-29 DIAGNOSIS — K921 Melena: Secondary | ICD-10-CM | POA: Diagnosis not present

## 2020-04-29 DIAGNOSIS — R710 Precipitous drop in hematocrit: Secondary | ICD-10-CM | POA: Diagnosis not present

## 2020-04-29 DIAGNOSIS — K5730 Diverticulosis of large intestine without perforation or abscess without bleeding: Secondary | ICD-10-CM | POA: Diagnosis not present

## 2020-04-29 DIAGNOSIS — K5520 Angiodysplasia of colon without hemorrhage: Secondary | ICD-10-CM | POA: Diagnosis not present

## 2020-04-29 DIAGNOSIS — K558 Other vascular disorders of intestine: Secondary | ICD-10-CM

## 2020-04-29 DIAGNOSIS — K449 Diaphragmatic hernia without obstruction or gangrene: Secondary | ICD-10-CM | POA: Diagnosis present

## 2020-04-29 DIAGNOSIS — K922 Gastrointestinal hemorrhage, unspecified: Secondary | ICD-10-CM | POA: Diagnosis not present

## 2020-04-29 DIAGNOSIS — M47816 Spondylosis without myelopathy or radiculopathy, lumbar region: Secondary | ICD-10-CM | POA: Diagnosis not present

## 2020-04-29 DIAGNOSIS — N1832 Chronic kidney disease, stage 3b: Secondary | ICD-10-CM | POA: Diagnosis present

## 2020-04-29 DIAGNOSIS — Z5181 Encounter for therapeutic drug level monitoring: Secondary | ICD-10-CM

## 2020-04-29 DIAGNOSIS — I130 Hypertensive heart and chronic kidney disease with heart failure and stage 1 through stage 4 chronic kidney disease, or unspecified chronic kidney disease: Secondary | ICD-10-CM | POA: Diagnosis present

## 2020-04-29 DIAGNOSIS — I482 Chronic atrial fibrillation, unspecified: Secondary | ICD-10-CM | POA: Diagnosis not present

## 2020-04-29 DIAGNOSIS — I5042 Chronic combined systolic (congestive) and diastolic (congestive) heart failure: Secondary | ICD-10-CM | POA: Diagnosis present

## 2020-04-29 DIAGNOSIS — E1151 Type 2 diabetes mellitus with diabetic peripheral angiopathy without gangrene: Secondary | ICD-10-CM | POA: Diagnosis present

## 2020-04-29 DIAGNOSIS — D62 Acute posthemorrhagic anemia: Secondary | ICD-10-CM | POA: Diagnosis present

## 2020-04-29 DIAGNOSIS — I252 Old myocardial infarction: Secondary | ICD-10-CM | POA: Diagnosis not present

## 2020-04-29 DIAGNOSIS — Z955 Presence of coronary angioplasty implant and graft: Secondary | ICD-10-CM

## 2020-04-29 DIAGNOSIS — K31811 Angiodysplasia of stomach and duodenum with bleeding: Principal | ICD-10-CM | POA: Diagnosis present

## 2020-04-29 DIAGNOSIS — E872 Acidosis: Secondary | ICD-10-CM | POA: Diagnosis present

## 2020-04-29 DIAGNOSIS — K6389 Other specified diseases of intestine: Secondary | ICD-10-CM | POA: Diagnosis not present

## 2020-04-29 DIAGNOSIS — I4892 Unspecified atrial flutter: Secondary | ICD-10-CM

## 2020-04-29 DIAGNOSIS — K5909 Other constipation: Secondary | ICD-10-CM | POA: Diagnosis present

## 2020-04-29 DIAGNOSIS — I2510 Atherosclerotic heart disease of native coronary artery without angina pectoris: Secondary | ICD-10-CM | POA: Diagnosis present

## 2020-04-29 DIAGNOSIS — N179 Acute kidney failure, unspecified: Secondary | ICD-10-CM

## 2020-04-29 DIAGNOSIS — E1122 Type 2 diabetes mellitus with diabetic chronic kidney disease: Secondary | ICD-10-CM | POA: Diagnosis present

## 2020-04-29 DIAGNOSIS — Z95810 Presence of automatic (implantable) cardiac defibrillator: Secondary | ICD-10-CM

## 2020-04-29 DIAGNOSIS — M533 Sacrococcygeal disorders, not elsewhere classified: Secondary | ICD-10-CM | POA: Diagnosis not present

## 2020-04-29 DIAGNOSIS — Z8674 Personal history of sudden cardiac arrest: Secondary | ICD-10-CM

## 2020-04-29 DIAGNOSIS — D649 Anemia, unspecified: Secondary | ICD-10-CM | POA: Diagnosis not present

## 2020-04-29 DIAGNOSIS — Z7984 Long term (current) use of oral hypoglycemic drugs: Secondary | ICD-10-CM | POA: Diagnosis not present

## 2020-04-29 DIAGNOSIS — K219 Gastro-esophageal reflux disease without esophagitis: Secondary | ICD-10-CM | POA: Diagnosis present

## 2020-04-29 DIAGNOSIS — I4891 Unspecified atrial fibrillation: Secondary | ICD-10-CM | POA: Diagnosis present

## 2020-04-29 DIAGNOSIS — J449 Chronic obstructive pulmonary disease, unspecified: Secondary | ICD-10-CM | POA: Diagnosis not present

## 2020-04-29 DIAGNOSIS — E785 Hyperlipidemia, unspecified: Secondary | ICD-10-CM | POA: Diagnosis present

## 2020-04-29 DIAGNOSIS — I959 Hypotension, unspecified: Secondary | ICD-10-CM | POA: Diagnosis present

## 2020-04-29 DIAGNOSIS — Z79899 Other long term (current) drug therapy: Secondary | ICD-10-CM | POA: Diagnosis not present

## 2020-04-29 DIAGNOSIS — Z800 Family history of malignant neoplasm of digestive organs: Secondary | ICD-10-CM

## 2020-04-29 DIAGNOSIS — Z7982 Long term (current) use of aspirin: Secondary | ICD-10-CM

## 2020-04-29 DIAGNOSIS — Z20822 Contact with and (suspected) exposure to covid-19: Secondary | ICD-10-CM | POA: Diagnosis present

## 2020-04-29 DIAGNOSIS — D509 Iron deficiency anemia, unspecified: Secondary | ICD-10-CM | POA: Diagnosis not present

## 2020-04-29 DIAGNOSIS — R195 Other fecal abnormalities: Secondary | ICD-10-CM | POA: Diagnosis not present

## 2020-04-29 DIAGNOSIS — K31819 Angiodysplasia of stomach and duodenum without bleeding: Secondary | ICD-10-CM | POA: Diagnosis not present

## 2020-04-29 DIAGNOSIS — Z87891 Personal history of nicotine dependence: Secondary | ICD-10-CM

## 2020-04-29 DIAGNOSIS — Z7901 Long term (current) use of anticoagulants: Secondary | ICD-10-CM | POA: Diagnosis not present

## 2020-04-29 LAB — COMPREHENSIVE METABOLIC PANEL
ALT: 18 U/L (ref 0–44)
AST: 20 U/L (ref 15–41)
Albumin: 3.5 g/dL (ref 3.5–5.0)
Alkaline Phosphatase: 57 U/L (ref 38–126)
Anion gap: 9 (ref 5–15)
BUN: 41 mg/dL — ABNORMAL HIGH (ref 8–23)
CO2: 21 mmol/L — ABNORMAL LOW (ref 22–32)
Calcium: 8.5 mg/dL — ABNORMAL LOW (ref 8.9–10.3)
Chloride: 107 mmol/L (ref 98–111)
Creatinine, Ser: 2.07 mg/dL — ABNORMAL HIGH (ref 0.61–1.24)
GFR, Estimated: 33 mL/min — ABNORMAL LOW (ref >60)
Glucose, Bld: 122 mg/dL — ABNORMAL HIGH (ref 70–99)
Potassium: 4.1 mmol/L (ref 3.5–5.1)
Sodium: 137 mmol/L (ref 135–145)
Total Bilirubin: 0.8 mg/dL (ref 0.3–1.2)
Total Protein: 6.1 g/dL — ABNORMAL LOW (ref 6.5–8.1)

## 2020-04-29 LAB — CBC
HCT: 21.1 % — ABNORMAL LOW (ref 39.0–52.0)
Hematocrit: 19.8 % — ABNORMAL LOW (ref 37.5–51.0)
Hemoglobin: 6.8 g/dL — CL (ref 13.0–17.7)
Hemoglobin: 6.6 g/dL — CL (ref 13.0–17.0)
MCH: 30.9 pg (ref 26.6–33.0)
MCH: 30.7 pg (ref 26.0–34.0)
MCHC: 34.3 g/dL (ref 31.5–35.7)
MCHC: 31.3 g/dL (ref 30.0–36.0)
MCV: 90 fL (ref 79–97)
MCV: 98.1 fL (ref 80.0–100.0)
NRBC: 1 % — ABNORMAL HIGH (ref 0–0)
Platelets: 193 10*3/uL (ref 150–450)
Platelets: 198 10*3/uL (ref 150–400)
RBC: 2.2 x10E6/uL — CL (ref 4.14–5.80)
RBC: 2.15 MIL/uL — ABNORMAL LOW (ref 4.22–5.81)
RDW: 16.7 % — ABNORMAL HIGH (ref 11.6–15.4)
RDW: 19.7 % — ABNORMAL HIGH (ref 11.5–15.5)
WBC: 9.9 10*3/uL (ref 3.4–10.8)
WBC: 9.4 10*3/uL (ref 4.0–10.5)
nRBC: 1.2 % — ABNORMAL HIGH (ref 0.0–0.2)

## 2020-04-29 LAB — POCT INR: INR: 2.6 (ref 2.0–3.0)

## 2020-04-29 LAB — PROTIME-INR
INR: 2.7 — ABNORMAL HIGH (ref 0.8–1.2)
Prothrombin Time: 27.6 seconds — ABNORMAL HIGH (ref 11.4–15.2)

## 2020-04-29 LAB — PREPARE RBC (CROSSMATCH)

## 2020-04-29 MED ORDER — SODIUM CHLORIDE 0.9 % IV SOLN: 10.0000 mL/h | Freq: Once | INTRAVENOUS | Status: DC

## 2020-04-29 MED ORDER — APIXABAN 5 MG PO TABS: 5.0000 mg | ORAL_TABLET | Freq: Two times a day (BID) | ORAL | 5 refills | Status: DC

## 2020-04-29 NOTE — ED Provider Notes (Signed)
[facility] EMERGENCY DEPARTMENT Provider Note   CSN: [id number] Arrival date & time: 04/29/20  1942     History Chief Complaint  Patient presents with  . Abnormal Lab    Zalma is a 75 y.o. male with a h/o of A Fib on Eliquis, diabetes mellitus type 2, V. fib arrest s/p ICD implant, PAD, and CAD s/p DES to LAD in 4/10 who presents to the emergency department with a chief complaint of abnormal labs.  The patient was following up with his cardiologist, Dr. Gwenlyn Found, this morning after he was recently transitioned from Coumadin to Eliquis to discuss concerns about the cost. (Patient took his first dose of Eliquis today.  Last dose of Coumadin was 3/1 in the AM).  Labs were rechecked in the office today and he received a call that his hemoglobin was 6.8, and he should go to the ER.  Patient was seen in the ER for symptomatic anemia on March 1.  He received a blood transfusion and admission was recommended, but patient left AGAINST MEDICAL ADVICE.  He is scheduled for a colonoscopy on March 18.  He had an EGD in November 2021 that showed a small duodenal AVM.  He reports that he has been feeling much better since his blood transfusion over the last few days, but notes that he did have an episode today where he suddenly felt very lightheaded.  Reports that he was bent over changing the trash bag in his trash can when he suddenly felt very lightheaded, which caused him to fall to his knees.  He denies hitting his head.  No loss of consciousness.  He landed on his bilateral knees.  He was able to stand and ambulate independently after the episode.  Otherwise, he denies shortness of breath, fatigue.  He has not had a bowel movement in the last 3 days so he is unsure if he has had melena or hematochezia.  No fevers, chills, chest pain, abdominal pain, hematemesis, epistaxis, or rash.  No other complaints at this time.   The history is provided by the patient and medical records.  No language interpreter was used.       Past Medical History:  Diagnosis Date  . Coronary artery disease    a. s/p NSTEMI in 2010 with DES to proximal LAD with D1 jailed and angioplasty alone to ostium  . Hyperlipidemia   . Hypertension   . Hypertension   . Peripheral arterial disease ([place])   . Tobacco abuse   . Type 2 diabetes mellitus [facility])     Patient Active Problem List   Diagnosis Date Noted  . Iron deficiency anemia 01/19/2020  . Constipation 01/19/2020  . Paraesophageal hernia   . Accidental fall 01/05/2020  . Prolonged QT interval 01/05/2020  . Atrial fibrillation, chronic ([place]) 01/05/2020  . GI bleed 01/04/2020  . Orthostatic hypotension 01/04/2020  . Acute blood loss anemia 01/04/2020  . Generalized weakness 01/04/2020  . Hyponatremia 01/04/2020  . Hyperkalemia 01/04/2020  . CAD (coronary artery disease) 01/04/2020  . HFrEF (heart failure with reduced ejection fraction) ([place]) 01/04/2020  . ICD (implantable cardioverter-defibrillator) in place 12/15/2019  . Acute renal failure ([place]) 09/02/2019  . Hypokalemia 09/02/2019  . Hyperglycemia 09/02/2019  . Hypoalbuminemia 09/02/2019  . Elevated brain natriuretic peptide (BNP) level 09/02/2019  . Lactic acidosis 09/02/2019  . Leukocytosis 09/02/2019  . Proteinuria 09/02/2019  . Hypomagnesemia 09/02/2019  . NSTEMI (non-ST elevated myocardial infarction) ([place]) 09/02/2019  . CKD (chronic  kidney disease), stage III b 09/02/2019  . PVC's (premature ventricular contractions) 09/02/2019  . NSVT (nonsustained ventricular tachycardia) ([place]) 09/02/2019  . Thrombocytopenia ([place]) 09/02/2019  . Anemia 09/02/2019  . Severe sepsis with septic shock ([place]) suspected on admission, source: ? aspiration 09/02/2019  . Acute metabolic encephalopathy [id number]  . Acute systolic CHF (congestive heart failure) ([place]) 09/02/2019  . Anticoagulated 09/02/2019  . History of noncompliance with medical treatment 09/02/2019  . Cardiac arrest  with ventricular fibrillation ([place])   . Acute on chronic respiratory failure with hypoxia ([place]) 04/23/2019  . Lobar pneumonia ([place]) 04/22/2019  . Aspiration pneumonia ([place]), bilateral   . Demand ischemia of myocardium ([place])   . Atrial flutter ([place])   . Acute on chronic diastolic heart failure ([place])   . Acute respiratory failure with hypoxemia ([place]) 07/02/2016  . COPD (chronic obstructive pulmonary disease) ([place]) 07/02/2016  . Tobacco use 07/17/2013  . Peripheral arterial disease ([place]) 12/03/2012  . Coronary artery disease s/p DES to LAD in 2010, s/p PCI to proximal LAD stenosis with DES this hospitalization 12/03/2012  . Essential hypertension 12/03/2012  . Hyperlipidemia 12/03/2012  . Type 2 diabetes mellitus ([place]) 12/03/2012    Past Surgical History:  Procedure Laterality Date  . CARDIAC SURGERY    . COLONOSCOPY WITH PROPOFOL N/A 01/08/2020   Procedure: COLONOSCOPY WITH PROPOFOL;  Surgeon: Eloise [name], DO;  [facility];  Service: Endoscopy;  Laterality: N/A;  . CORONARY STENT INTERVENTION N/A 09/03/2019   Procedure: CORONARY STENT INTERVENTION;  Surgeon: Burnell Blanks, MD;  Location: [facility];  Service: Cardiovascular;  Laterality: N/A;  . CORONARY STENT PLACEMENT    . ESOPHAGOGASTRODUODENOSCOPY (EGD) WITH PROPOFOL N/A 01/06/2020   Procedure: ESOPHAGOGASTRODUODENOSCOPY (EGD) WITH PROPOFOL;  Surgeon: Rogene Houston, MD;  Location: [facility];  Service: Endoscopy;  Laterality: N/A;  . ICD IMPLANT N/A 09/04/2019   Procedure: ICD IMPLANT;  Surgeon: Evans Lance, MD;  Location: [facility];  Service: Cardiovascular;  Laterality: N/A;  . RIGHT/LEFT HEART CATH AND CORONARY ANGIOGRAPHY N/A 09/03/2019   Procedure: RIGHT/LEFT HEART CATH AND CORONARY ANGIOGRAPHY;  Surgeon: Jolaine Artist, MD;  Location: [facility];  Service: Cardiovascular;  Laterality: N/A;       Family History  Problem Relation Age of Onset  . Cancer Mother         not sure location   . Colon cancer Neg Hx   . Stomach cancer Neg Hx     Social History   Tobacco Use  . Smoking status: Former Smoker    Packs/day: 0.50    Years: 52.00    Pack years: 26.00    Types: Cigarettes  . Smokeless tobacco: Never Used  Vaping Use  . Vaping Use: Never used  Substance Use Topics  . Alcohol use: No  . Drug use: No    Home Medications Prior to Admission medications   Medication Sig Start Date End Date Taking? Authorizing Provider  acetaminophen (TYLENOL) 325 MG tablet Take 2 tablets (650 mg total) by mouth every 6 (six) hours as needed for mild pain, fever or headache (or Fever >/= 101). 04/28/19  Yes Emokpae, Courage, MD  albuterol (VENTOLIN HFA) 108 (90 Base) MCG/ACT inhaler Inhale 2 puffs into the lungs every 4 (four) hours as needed for wheezing or shortness of breath. 04/28/19  Yes Emokpae, Courage, MD  amiodarone (PACERONE) 200 MG tablet Take 1 tablet (200 mg total) by mouth daily. 01/09/20  Yes Wynetta Emery,  Clanford L, MD  apixaban (ELIQUIS) 5 MG TABS tablet Take 1 tablet (5 mg total) by mouth 2 (two) times daily. 04/29/20  Yes Lorretta Harp, MD  aspirin EC 81 MG tablet Take 81 mg by mouth daily. Swallow whole.   Yes [provider]  atorvastatin (LIPITOR) 80 MG tablet Take 80 mg by mouth daily.   Yes [provider]  carvedilol (COREG) 6.25 MG tablet TAKE 1 TABLET BY MOUTH 2 TIMES DAILY WITH A MEAL. Patient taking differently: Take 6.25 mg by mouth daily. 04/25/20  Yes Cleaver, Jossie Ng, NP  cyanocobalamin (,VITAMIN B-12,) 1000 MCG/ML injection Inject 1,000 mcg into the muscle every 30 (thirty) days.   Yes [provider]  ferrous sulfate (FERROUSUL) 325 (65 FE) MG tablet Take 1 tablet (325 mg total) by mouth daily with breakfast. 01/19/20  Yes Rehman, Mechele Dawley, MD  fluocinonide cream (LIDEX) [id number] % Apply 1 application topically 2 (two) times daily as needed (rash). 12/23/19  Yes [provider]  furosemide (LASIX) 20 MG  tablet Take 20 mg by mouth daily.   Yes [provider]  guaiFENesin (MUCINEX) 600 MG 12 hr tablet Take 600 mg by mouth 2 (two) times daily as needed for cough or to loosen phlegm.   Yes [provider]  isosorbide dinitrate (ISORDIL) 30 MG tablet Take 30 mg by mouth daily.   Yes [provider]  losartan (COZAAR) 25 MG tablet Take 25 mg by mouth daily.   Yes [provider]  lubiprostone (AMITIZA) 8 MCG capsule Take 1 capsule (8 mcg total) by mouth 2 (two) times daily with a meal. Patient taking differently: Take 8 mcg by mouth daily with breakfast. 01/19/20  Yes Rehman, Mechele Dawley, MD  Melatonin 3 MG CAPS Take 6 mg by mouth at bedtime as needed (sleep).   Yes [provider]  metoprolol tartrate (LOPRESSOR) 25 MG tablet Take 25 mg by mouth daily.   Yes [provider]  nitroGLYCERIN (NITROSTAT) 0.4 MG SL tablet PLACE 1 TABLET UNDER THE TONGUE EVERY 5 MINUTES AS NEEDED FOR CHEST PAIN Patient taking differently: Place 0.4 mg under the tongue every 5 (five) minutes as needed. PLACE 1 TABLET UNDER THE TONGUE EVERY 5 MINUTES AS NEEDED FOR CHEST PAIN 11/26/19  Yes Lorretta Harp, MD  omeprazole (PRILOSEC) 20 MG capsule Take 20 mg by mouth daily.   Yes [provider]  potassium chloride SA (KLOR-CON) 20 MEQ tablet Take 20 mEq by mouth daily.   Yes [provider]  spironolactone (ALDACTONE) 50 MG tablet Take 50 mg by mouth daily.   Yes [provider]  traZODone (DESYREL) 50 MG tablet Take 50 mg by mouth at bedtime as needed for sleep.   Yes [provider]  vitamin B-12 1000 MCG tablet Take 1 tablet (1,000 mcg total) by mouth daily. 01/09/20  Yes Johnson, Clanford L, MD  potassium chloride (KLOR-CON) 20 MEQ packet TAKE 1 TABLET BY MOUTH EVERY DAY 12/24/12 07/14/13  Lorretta Harp, MD    Allergies    Patient has no known allergies.  Review of Systems   Review of Systems  Constitutional: Negative for  appetite change, chills, diaphoresis and fever.  HENT: Negative for congestion and sore throat.   Eyes: Negative for visual disturbance.  Respiratory: Negative for shortness of breath and wheezing.   Cardiovascular: Negative for chest pain and palpitations.  Gastrointestinal: Negative for abdominal pain, constipation, diarrhea, nausea and vomiting.  Genitourinary: Negative for dysuria.  Musculoskeletal: Negative for back pain, myalgias, neck pain and neck stiffness.  Skin: Negative for rash and wound.  Allergic/Immunologic: Negative for immunocompromised state.  Neurological: Positive for light-headedness. Negative for dizziness, seizures, syncope, speech difficulty, weakness and headaches.  Psychiatric/Behavioral: Negative for confusion.    Physical Exam Updated Vital Signs BP 116/63   Pulse 75   Temp 98.5 F (36.9 C) (Oral)   Resp (!) 26   SpO2 99%   Physical Exam Vitals and nursing note reviewed.  Constitutional:      General: He is not in acute distress.    Appearance: He is well-developed. He is not ill-appearing, toxic-appearing or diaphoretic.     Comments: Pleasant, elderly male in no acute distress.  HENT:     Head: Normocephalic.  Eyes:     Conjunctiva/sclera: Conjunctivae normal.  Cardiovascular:     Rate and Rhythm: Normal rate and regular rhythm.     Heart sounds: No murmur heard.   Pulmonary:     Effort: Pulmonary effort is normal. No respiratory distress.     Breath sounds: No stridor. No wheezing, rhonchi or rales.  Chest:     Chest wall: No tenderness.  Abdominal:     General: There is distension.     Palpations: Abdomen is soft. There is no mass.     Tenderness: There is no abdominal tenderness. There is no right CVA tenderness, left CVA tenderness, guarding or rebound.     Hernia: No hernia is present.     Comments: Abdomen is distended, but soft and nontender.  Musculoskeletal:     Cervical back: Neck supple.     Right lower leg: No edema.      Left lower leg: No edema.     Comments: No tenderness to palpation to the bilateral lower extremities.  Neurovascularly intact.  Skin:    General: Skin is warm and dry.  Neurological:     Mental Status: He is alert.  Psychiatric:        Behavior: Behavior normal.     ED Results / Procedures / Treatments   Labs (all labs ordered are listed, but only abnormal results are displayed) Labs Reviewed  COMPREHENSIVE METABOLIC PANEL - Abnormal; Notable for the following components:      Result Value   CO2 21 (*)    Glucose, Bld 122 (*)    BUN 41 (*)    Creatinine, Ser 2.07 (*)    Calcium 8.5 (*)    Total Protein 6.1 (*)    GFR, Estimated 33 (*)    All other components within normal limits  CBC - Abnormal; Notable for the following components:   RBC 2.15 (*)    Hemoglobin 6.6 (*)    HCT 21.1 (*)    RDW 19.7 (*)    nRBC 1.2 (*)    All other components within normal limits  PROTIME-INR - Abnormal; Notable for the following components:   Prothrombin Time 27.6 (*)    INR 2.7 (*)    All other components within normal limits  POC OCCULT BLOOD, ED  TYPE AND SCREEN  PREPARE RBC (CROSSMATCH)    EKG None  Radiology No results found.  Procedures .Critical Care Performed by: Joanne Gavel, PA-C Authorized by: Joanne Gavel, PA-C   Critical care provider statement:    Critical care time (minutes):  40   Critical care time was exclusive of:  Separately billable procedures and treating other patients and teaching time   Critical care was  necessary to treat or prevent imminent or life-threatening deterioration of the following conditions: GI bleed.   Critical care was time spent personally by me on the following activities:  Ordering and performing treatments and interventions, ordering and review of laboratory studies, re-evaluation of patient's condition, discussions with consultants, development of treatment plan with patient or surrogate, examination of patient, obtaining  history from patient or surrogate, review of old charts, pulse oximetry and evaluation of patient's response to treatment   I assumed direction of critical care for this patient from another provider in my specialty: no     Care discussed with: admitting provider       Medications Ordered in ED Medications  0.9 %  sodium chloride infusion (has no administration in time range)    ED Course  I have reviewed the triage vital signs and the nursing notes.  Pertinent labs & imaging results that were available during my care of the patient were reviewed by me and considered in my medical decision making (see chart for details).    MDM Rules/Calculators/A&P                          75 year old male with a h/o of A Fib on Eliquis, diabetes mellitus type 2, V. fib arrest s/p ICD implant, PAD, and CAD s/p DES to LAD in 4/10 who presents the emergency department with a chief complaint of abnormal lab.  Patient was seen by cardiology this morning and had a hemoglobin of 6.8.  The patient was in the ER on 3 1 and had a positive Hemoccult and a hemoglobin of 6.1.  He received a transfusion, but left AMA after admission was recommended.  Notably, he also had an episode of lightheadedness today and had a fall where he fell onto his bilateral knees.  He did not hit his head.  He has been able to walk since the fall.  Vital signs are stable.  The patient is a seen and independently evaluated by Dr. Tyrone Nine, attending physician.  Labs have been reviewed and independently interpreted by me.  Hemoglobin is 6.6.  He was heme positive on March 1.  There is a mild increase in his creatinine, 2.0, up from 1.76 earlier this week.  No metabolic derangements.  PT/INR is pending.  Spoke with Dr. Loletha Carrow, GI.  The patient is established with [facility], but since he is at [facility] he is considered unassigned.  GI will see the patient in the morning.  Consult to the hospitalist team and Dr. Marlowe Sax will accept the  patient for admission. The patient appears reasonably stabilized for admission considering the current resources, flow, and capabilities available in the ED at this time, and I doubt any other [facility] requiring further screening and/or treatment in the ED prior to admission.  Final Clinical Impression(s) / ED Diagnoses Final diagnoses:  Gastrointestinal hemorrhage, unspecified gastrointestinal hemorrhage type  Symptomatic anemia    Rx / DC Orders ED Discharge Orders    None       Manali Mcelmurry A, PA-C 04/29/20 [place], Dan, DO 05/01/20 1500

## 2020-04-29 NOTE — H&P (Signed)
History and Physical    Peter Lambert [id number] DOB: 1946/01/18 DOA: 04/29/2020  PCP: Redmond School, MD Patient coming from: Home  Chief Complaint: Abnormal labs  HPI: Peter Lambert is a 75 y.o. male with medical history significant of CAD status post PCI/DES to LAD in 05/2008, V. fib arrest/chronic systolic and diastolic CHF (EF 123456) status post ICD implant, atrial flutter on chronic anticoagulation, hypertension, hyperlipidemia, PAD, type 2 diabetes, CKD stage III.  Patient had a follow-up visit with his cardiologist today after he was recently transitioned from Coumadin to Eliquis.  Patient took his first dose of Eliquis today and last dose of Coumadin was on 3/1 AM.  Labs checked at cardiology office today revealed hemoglobin of 6.8 and he was sent to the ED.  Patient was seen in the ED for symptomatic anemia on 3/1 and hemoglobin was 6.1 at that time with heme positive stool.  INR was 6 and he was given vitamin K.  He was given a blood transfusion and admission was recommended at that time but he left AMA.  He is scheduled for a colonoscopy on 3/18.  Patient states he is chronically constipated and takes laxatives.  He has not had a bowel movement for the past 3 days.  He does not recall having any episodes of bloody bowel movements or dark stool.  He has not vomited any blood and denies abdominal pain.  States yesterday while taking his trash out he felt dizzy and weak.  He fell onto his knees but was able to get up and walk on his own.  He did not hit his head or lose consciousness.  Denies any pain in his knees or difficulty walking at present.  Denies chest pain, shortness of breath, or palpitations.  He is fully vaccinated against COVID including booster shot.  ED Course: Hemodynamically stable.  Hemoglobin 6.6.  INR 2.6.  1 unit PRBCs ordered.  ED physician discussed the case with Dr. Lance Sell GI will consult in a.m. BUN 41, creatinine 2.0.  Baseline creatinine around  1.3.  Review of Systems:  All systems reviewed and apart from history of presenting illness, are negative.  Past Medical History:  Diagnosis Date  . Coronary artery disease    a. s/p NSTEMI in 2010 with DES to proximal LAD with D1 jailed and angioplasty alone to ostium  . Hyperlipidemia   . Hypertension   . Hypertension   . Peripheral arterial disease ([place])   . Tobacco abuse   . Type 2 diabetes mellitus ([place])     Past Surgical History:  Procedure Laterality Date  . CARDIAC SURGERY    . COLONOSCOPY WITH PROPOFOL N/A 01/08/2020   Procedure: COLONOSCOPY WITH PROPOFOL;  Surgeon: Eloise [name], DO;  [facility];  Service: Endoscopy;  Laterality: N/A;  . CORONARY STENT INTERVENTION N/A 09/03/2019   Procedure: CORONARY STENT INTERVENTION;  Surgeon: Burnell Blanks, MD;  Location: [place] [facility];  Service: Cardiovascular;  Laterality: N/A;  . CORONARY STENT PLACEMENT    . ESOPHAGOGASTRODUODENOSCOPY (EGD) WITH PROPOFOL N/A 01/06/2020   Procedure: ESOPHAGOGASTRODUODENOSCOPY (EGD) WITH PROPOFOL;  Surgeon: Rogene Houston, MD;  [facility]: [facility];  Service: Endoscopy;  Laterality: N/A;  . ICD IMPLANT N/A 09/04/2019   Procedure: ICD IMPLANT;  Surgeon: Evans Lance, MD;  Location: [place] [facility];  Service: Cardiovascular;  Laterality: N/A;  . RIGHT/LEFT HEART CATH AND CORONARY ANGIOGRAPHY N/A 09/03/2019   Procedure: RIGHT/LEFT HEART CATH AND CORONARY ANGIOGRAPHY;  Surgeon: Jolaine Artist, MD;  Location: [place] [facility];  Service: Cardiovascular;  Laterality: N/A;     reports that he has quit smoking. His smoking use included cigarettes. He has a 26.00 pack-year smoking history. He has never used smokeless tobacco. He reports that he does not drink alcohol and does not use drugs.  No Known Allergies  Family History  Problem Relation Age of Onset  . Cancer Mother        not sure location   . Colon cancer Neg Hx   . Stomach cancer Neg Hx     Prior  to Admission medications   Medication Sig Start Date End Date Taking? Authorizing Provider  acetaminophen (TYLENOL) 325 MG tablet Take 2 tablets (650 mg total) by mouth every 6 (six) hours as needed for mild pain, fever or headache (or Fever >/= 101). 04/28/19  Yes Emokpae, Courage, MD  albuterol (VENTOLIN HFA) 108 (90 Base) MCG/ACT inhaler Inhale 2 puffs into the lungs every 4 (four) hours as needed for wheezing or shortness of breath. 04/28/19  Yes Emokpae, Courage, MD  amiodarone (PACERONE) 200 MG tablet Take 1 tablet (200 mg total) by mouth daily. 01/09/20  Yes Johnson, Clanford L, MD  apixaban (ELIQUIS) 5 MG TABS tablet Take 1 tablet (5 mg total) by mouth 2 (two) times daily. 04/29/20  Yes Lorretta Harp, MD  aspirin EC 81 MG tablet Take 81 mg by mouth daily. Swallow whole.   Yes [provider]  atorvastatin (LIPITOR) 80 MG tablet Take 80 mg by mouth daily.   Yes [provider]  carvedilol (COREG) 6.25 MG tablet TAKE 1 TABLET BY MOUTH 2 TIMES DAILY WITH A MEAL. Patient taking differently: Take 6.25 mg by mouth daily. 04/25/20  Yes Cleaver, Jossie Ng, NP  cyanocobalamin (,VITAMIN B-12,) 1000 MCG/ML injection Inject 1,000 mcg into the muscle every 30 (thirty) days.   Yes [provider]  ferrous sulfate (FERROUSUL) 325 (65 FE) MG tablet Take 1 tablet (325 mg total) by mouth daily with breakfast. 01/19/20  Yes Rehman, Mechele Dawley, MD  fluocinonide cream (LIDEX) [id number] % Apply 1 application topically 2 (two) times daily as needed (rash). 12/23/19  Yes [provider]  furosemide (LASIX) 20 MG tablet Take 20 mg by mouth daily.   Yes [provider]  guaiFENesin (MUCINEX) 600 MG 12 hr tablet Take 600 mg by mouth 2 (two) times daily as needed for cough or to loosen phlegm.   Yes [provider]  isosorbide dinitrate (ISORDIL) 30 MG tablet Take 30 mg by mouth daily.   Yes [provider]  losartan (COZAAR) 25 MG tablet Take 25 mg by mouth daily.    Yes [provider]  lubiprostone (AMITIZA) 8 MCG capsule Take 1 capsule (8 mcg total) by mouth 2 (two) times daily with a meal. Patient taking differently: Take 8 mcg by mouth daily with breakfast. 01/19/20  Yes Rehman, Mechele Dawley, MD  Melatonin 3 MG CAPS Take 6 mg by mouth at bedtime as needed (sleep).   Yes [provider]  metoprolol tartrate (LOPRESSOR) 25 MG tablet Take 25 mg by mouth daily.   Yes [provider]  nitroGLYCERIN (NITROSTAT) 0.4 MG SL tablet PLACE 1 TABLET UNDER THE TONGUE EVERY 5 MINUTES AS NEEDED FOR CHEST PAIN Patient taking differently: Place 0.4 mg under the tongue every 5 (five) minutes as needed. PLACE 1 TABLET UNDER THE TONGUE EVERY 5 MINUTES AS NEEDED FOR CHEST PAIN 11/26/19  Yes Gwenlyn Found,  Pearletha Forge, MD  omeprazole (PRILOSEC) 20 MG capsule Take 20 mg by mouth daily.   Yes [provider]  potassium chloride SA (KLOR-CON) 20 MEQ tablet Take 20 mEq by mouth daily.   Yes [provider]  spironolactone (ALDACTONE) 50 MG tablet Take 50 mg by mouth daily.   Yes [provider]  traZODone (DESYREL) 50 MG tablet Take 50 mg by mouth at bedtime as needed for sleep.   Yes [provider]  vitamin B-12 1000 MCG tablet Take 1 tablet (1,000 mcg total) by mouth daily. 01/09/20  Yes Johnson, Clanford L, MD  potassium chloride (KLOR-CON) 20 MEQ packet TAKE 1 TABLET BY MOUTH EVERY DAY 12/24/12 07/14/13  Lorretta Harp, MD    Physical Exam: Vitals:   04/29/20 2138 04/29/20 2300 04/29/20 2315 04/29/20 2332  BP: (!) 113/51 100/60 108/66 116/63  Pulse: 77 78 80 75  Resp: '18 17 18 '$ (!) 26  Temp:   98.9 F (37.2 C) 98.5 F (36.9 C)  TempSrc:   Oral Oral  SpO2: 100% 100% 100% 99%    Physical Exam Constitutional:      General: He is not in acute distress. HENT:     Head: Normocephalic and atraumatic.  Eyes:     Extraocular Movements: Extraocular movements intact.  Cardiovascular:     Rate and Rhythm: Normal rate and  regular rhythm.     Pulses: Normal pulses.  Pulmonary:     Effort: Pulmonary effort is normal. No respiratory distress.     Breath sounds: Normal breath sounds. No wheezing or rales.  Abdominal:     General: Bowel sounds are normal. There is distension.     Tenderness: There is no abdominal tenderness. There is no guarding.  Musculoskeletal:        General: No swelling or tenderness.     Cervical back: Normal range of motion and neck supple.  Skin:    General: Skin is warm and dry.  Neurological:     General: No focal deficit present.     Mental Status: He is alert and oriented to person, place, and time.     Labs on Admission: I have personally reviewed following labs and imaging studies  CBC: Recent Labs  Lab 04/26/20 1649 04/27/20 0450 04/29/20 1059 04/29/20 2010  WBC 9.5  --  9.9 9.4  NEUTROABS 6.7  --   --   --   HGB 6.1* 7.7* 6.8* 6.6*  HCT 19.5* 23.8* 19.8* 21.1*  MCV 98.0  --  90 98.1  PLT 169  --  193 [id number]   Basic Metabolic Panel: Recent Labs  Lab 04/26/20 1649 04/29/20 2010  NA 137 137  K 5.1 4.1  CL 106 107  CO2 24 21*  GLUCOSE 114* 122*  BUN 53* 41*  CREATININE 1.76* 2.07*  CALCIUM 8.5* 8.5*   GFR: Estimated Creatinine Clearance: 29.4 mL/min (A) (by C-G formula based on SCr of 2.07 mg/dL (H)). Liver Function Tests: Recent Labs  Lab 04/26/20 1649 04/29/20 2010  AST 16 20  ALT 17 18  ALKPHOS 42 57  BILITOT 0.3 0.8  PROT 6.4* 6.1*  ALBUMIN 3.5 3.5   No results for input(s): LIPASE, AMYLASE in the last 168 hours. No results for input(s): AMMONIA in the last 168 hours. Coagulation Profile: Recent Labs  Lab 04/26/20 1649 04/27/20 0450 04/29/20 1045 04/29/20 2240  INR 6.2* 5.3* 2.6 2.7*   Cardiac Enzymes: No results for input(s): CKTOTAL, CKMB, CKMBINDEX, TROPONINI in the last  168 hours. BNP (last 3 results) No results for input(s): PROBNP in the last 8760 hours. HbA1C: No results for input(s): HGBA1C in the last 72 hours. CBG: No  results for input(s): GLUCAP in the last 168 hours. Lipid Profile: No results for input(s): CHOL, HDL, LDLCALC, TRIG, CHOLHDL, LDLDIRECT in the last 72 hours. Thyroid Function Tests: No results for input(s): TSH, T4TOTAL, FREET4, T3FREE, THYROIDAB in the last 72 hours. Anemia Panel: No results for input(s): VITAMINB12, FOLATE, FERRITIN, TIBC, IRON, RETICCTPCT in the last 72 hours. Urine analysis:    Component Value Date/Time   COLORURINE YELLOW 01/05/2020 0449   APPEARANCEUR HAZY (A) 01/05/2020 0449   LABSPEC 1.015 01/05/2020 0449   PHURINE 5.0 01/05/2020 0449   GLUCOSEU NEGATIVE 01/05/2020 0449   HGBUR NEGATIVE 01/05/2020 0449   BILIRUBINUR NEGATIVE 01/05/2020 0449   KETONESUR NEGATIVE 01/05/2020 0449   PROTEINUR 30 (A) 01/05/2020 0449   NITRITE NEGATIVE 01/05/2020 0449   LEUKOCYTESUR NEGATIVE 01/05/2020 0449    Radiological Exams on Admission: No results found.  Assessment/Plan Principal Problem:   GI bleed Active Problems:   Atrial flutter ([facility])   Acute blood loss anemia   CAD (coronary artery disease)   AKI (acute kidney injury) ([place])   Symptomatic acute blood loss anemia secondary to suspected GI bleed: Patient is not reporting any episodes of hematemesis, hematochezia, or melena.  No bowel movement since he has been in the ED, however, did have heme positive stool during ED visit on 3/1 in the setting of chronic anticoagulation use for atrial flutter and antiplatelet agent use for CAD.  His hemoglobin was 6.1 on 3/1 and did receive blood transfusion at that time.  Labs at present showing hemoglobin 6.6 with MCV 98.1.  Hemoglobin previously in the 7-10 range but was as high as 12.1 in December 2021.  INR was supratherapeutic during ED visit on 3/1 but currently down to 2.6 and last dose of Coumadin was on 3/1 AM.  His first dose of Eliquis was 3/4.  EGD done 01/06/2020 revealed duodenal AVM.  Colonoscopy done 01/08/2020 revealed nonbleeding internal hemorrhoids and  diverticulosis in the entire examined colon. -Hold Eliquis and aspirin.  Keep n.p.o. Patient is currently hemodynamically stable.  [facility] in a.[facility]. 1 unit PRBCs ordered in the ED.  Follow-up posttransfusion H&H.  Transfuse if hemoglobin less than 7.  Chronic constipation: On lubiprostone at home and patient reports no bowel movements for the past 3 days. He has abdominal distention on exam. -KUB ordered to assess for signs of bowel obstruction/ileus  AKI on CKD stage III: BUN 41, creatinine 2.0.  Baseline creatinine around 1.3.  Home diuretics and ARB use could be contributing. -Gentle IV fluid hydration.  Check urine sodium and creatinine.  Order urinalysis.  Monitor renal function and urine output.  Avoid nephrotoxic agents.  Hold home diuretics and ARB.  CAD status post PCI/DES to LAD in 05/2008: Stable.  Not endorsing any anginal symptoms at present. -Hold aspirin.  Continue beta-blocker and statin.  History of V. fib arrest: Status post ICD. -Continue amiodarone  Chronic combined systolic and diastolic CHF (EF 123456): Status post ICD implant.  No signs of volume overload at present. -Hold Lasix, spironolactone, and losartan in the setting of AKI.  Monitor volume status closely.  Atrial flutter: Stable.  Currently in sinus rhythm. -Hold Eliquis in the setting of acute blood loss anemia/concern for GI bleed.  Continue Coreg and metoprolol for rate control.  Hyperlipidemia -Continue statin  Non-insulin-dependent type 2 diabetes -  Hold home Metformin.  Order sliding scale insulin sensitive every 4 hours as patient is currently n.p.o. check A1c.  GERD -Continue PPI  DVT prophylaxis: SCDs Code Status: Patient wishes to be full code. Family Communication: No family available at this time. Disposition Plan: Status is: Observation  The patient remains OBS appropriate and will d/c before 2 midnights.  Dispo: The patient is from: Home              Anticipated d/c is to:  Home              Patient currently is not medically stable to d/c.   Difficult to place patient No  Level of care: Telemetry  The medical decision making on this patient was of high complexity and the patient is at high risk for clinical deterioration, therefore this is a level 3 visit.  Shela Leff MD [facility]  If 7PM-7AM, please contact night-coverage [email or web address]  04/30/2020, 1:10 AM

## 2020-04-29 NOTE — Telephone Encounter (Signed)
The patient is a 75 year old male with past medical history of V. fib arrest status post ICD implant 09/04/2019, hypertension, hyperlipidemia, PAD, and CAD status post DES to LAD in April 2010.  Patient was most recently seen by Coletta Memos on 04/01/2020.  He went to the ED on 04/26/2020 due to symptomatic anemia.  His hemoglobin has dropped from the previous 10 in January down to 6.1.  After blood transfusion, hemoglobin went up to 7.7.  He went to the Coumadin clinic today, our clinical pharmacist switched him back to Eliquis and repeated a CBC.  Unfortunately his hemoglobin has dropped by a unit in the past 2 days.  Hemoglobin is now 6.8.  I have called the patient and recommend he come to the ED for additional evaluation.  He is next GI work-up is scheduled for March 18, with drop in hemoglobin, I do not recommend wait another 2 weeks as outpatient.  He likely will need earlier GI work-up as inpatient.

## 2020-04-29 NOTE — ED Triage Notes (Signed)
Patient sent to ED by PCP for low hgb, 6.8.  Patient states he has been feeling tired and a little short of breath.  Patient states that he is passing some blood thru his stool.  No abdominal pain.

## 2020-04-29 NOTE — Progress Notes (Signed)
Patient presently on Coumadin, INR checked, Patient to restart Eliquis 5 mg bid and stop Coumadin.

## 2020-04-30 ENCOUNTER — Observation Stay (HOSPITAL_COMMUNITY): Payer: PPO

## 2020-04-30 DIAGNOSIS — R195 Other fecal abnormalities: Secondary | ICD-10-CM | POA: Diagnosis not present

## 2020-04-30 DIAGNOSIS — N179 Acute kidney failure, unspecified: Secondary | ICD-10-CM

## 2020-04-30 DIAGNOSIS — R140 Abdominal distension (gaseous): Secondary | ICD-10-CM | POA: Diagnosis not present

## 2020-04-30 DIAGNOSIS — K922 Gastrointestinal hemorrhage, unspecified: Secondary | ICD-10-CM | POA: Diagnosis not present

## 2020-04-30 DIAGNOSIS — M47816 Spondylosis without myelopathy or radiculopathy, lumbar region: Secondary | ICD-10-CM | POA: Diagnosis not present

## 2020-04-30 DIAGNOSIS — M533 Sacrococcygeal disorders, not elsewhere classified: Secondary | ICD-10-CM | POA: Diagnosis not present

## 2020-04-30 DIAGNOSIS — K558 Other vascular disorders of intestine: Secondary | ICD-10-CM

## 2020-04-30 DIAGNOSIS — R710 Precipitous drop in hematocrit: Secondary | ICD-10-CM | POA: Diagnosis not present

## 2020-04-30 DIAGNOSIS — J449 Chronic obstructive pulmonary disease, unspecified: Secondary | ICD-10-CM | POA: Diagnosis not present

## 2020-04-30 DIAGNOSIS — K6389 Other specified diseases of intestine: Secondary | ICD-10-CM | POA: Diagnosis not present

## 2020-04-30 DIAGNOSIS — K5520 Angiodysplasia of colon without hemorrhage: Secondary | ICD-10-CM

## 2020-04-30 LAB — URINALYSIS, ROUTINE W REFLEX MICROSCOPIC
Bacteria, UA: NONE SEEN
Bilirubin Urine: NEGATIVE
Glucose, UA: NEGATIVE mg/dL
Hgb urine dipstick: NEGATIVE
Ketones, ur: NEGATIVE mg/dL
Nitrite: NEGATIVE
Protein, ur: NEGATIVE mg/dL
Specific Gravity, Urine: 1.015 (ref 1.005–1.030)
pH: 5 (ref 5.0–8.0)

## 2020-04-30 LAB — BASIC METABOLIC PANEL
Anion gap: 5 (ref 5–15)
BUN: 29 mg/dL — ABNORMAL HIGH (ref 8–23)
CO2: 23 mmol/L (ref 22–32)
Calcium: 8.3 mg/dL — ABNORMAL LOW (ref 8.9–10.3)
Chloride: 109 mmol/L (ref 98–111)
Creatinine, Ser: 1.48 mg/dL — ABNORMAL HIGH (ref 0.61–1.24)
GFR, Estimated: 49 mL/min — ABNORMAL LOW (ref >60)
Glucose, Bld: 120 mg/dL — ABNORMAL HIGH (ref 70–99)
Potassium: 4 mmol/L (ref 3.5–5.1)
Sodium: 137 mmol/L (ref 135–145)

## 2020-04-30 LAB — GLUCOSE, CAPILLARY: Glucose-Capillary: 122 mg/dL — ABNORMAL HIGH (ref 70–99)

## 2020-04-30 LAB — HEMOGLOBIN AND HEMATOCRIT, BLOOD
HCT: 21.2 % — ABNORMAL LOW (ref 39.0–52.0)
Hemoglobin: 7.1 g/dL — ABNORMAL LOW (ref 13.0–17.0)

## 2020-04-30 LAB — CBG MONITORING, ED
Glucose-Capillary: 133 mg/dL — ABNORMAL HIGH (ref 70–99)
Glucose-Capillary: 100 mg/dL — ABNORMAL HIGH (ref 70–99)
Glucose-Capillary: 98 mg/dL (ref 70–99)

## 2020-04-30 LAB — CREATININE, URINE, RANDOM: Creatinine, Urine: 102.69 mg/dL

## 2020-04-30 LAB — SARS CORONAVIRUS 2 (TAT 6-24 HRS): SARS Coronavirus 2: NEGATIVE

## 2020-04-30 LAB — HEMOGLOBIN A1C
Hgb A1c MFr Bld: 6.1 % — ABNORMAL HIGH (ref 4.8–5.6)
Mean Plasma Glucose: 128.37 mg/dL

## 2020-04-30 LAB — SODIUM, URINE, RANDOM: Sodium, Ur: 55 mmol/L

## 2020-04-30 IMAGING — DX DG ABDOMEN 1V
1 series · 1 of 1 positions shown · non-contrast
Comparison: Prior study from [DATE].

CLINICAL DATA: Initial evaluation for abdominal distension.

EXAM:
ABDOMEN - 1 VIEW

[abdomen kub]
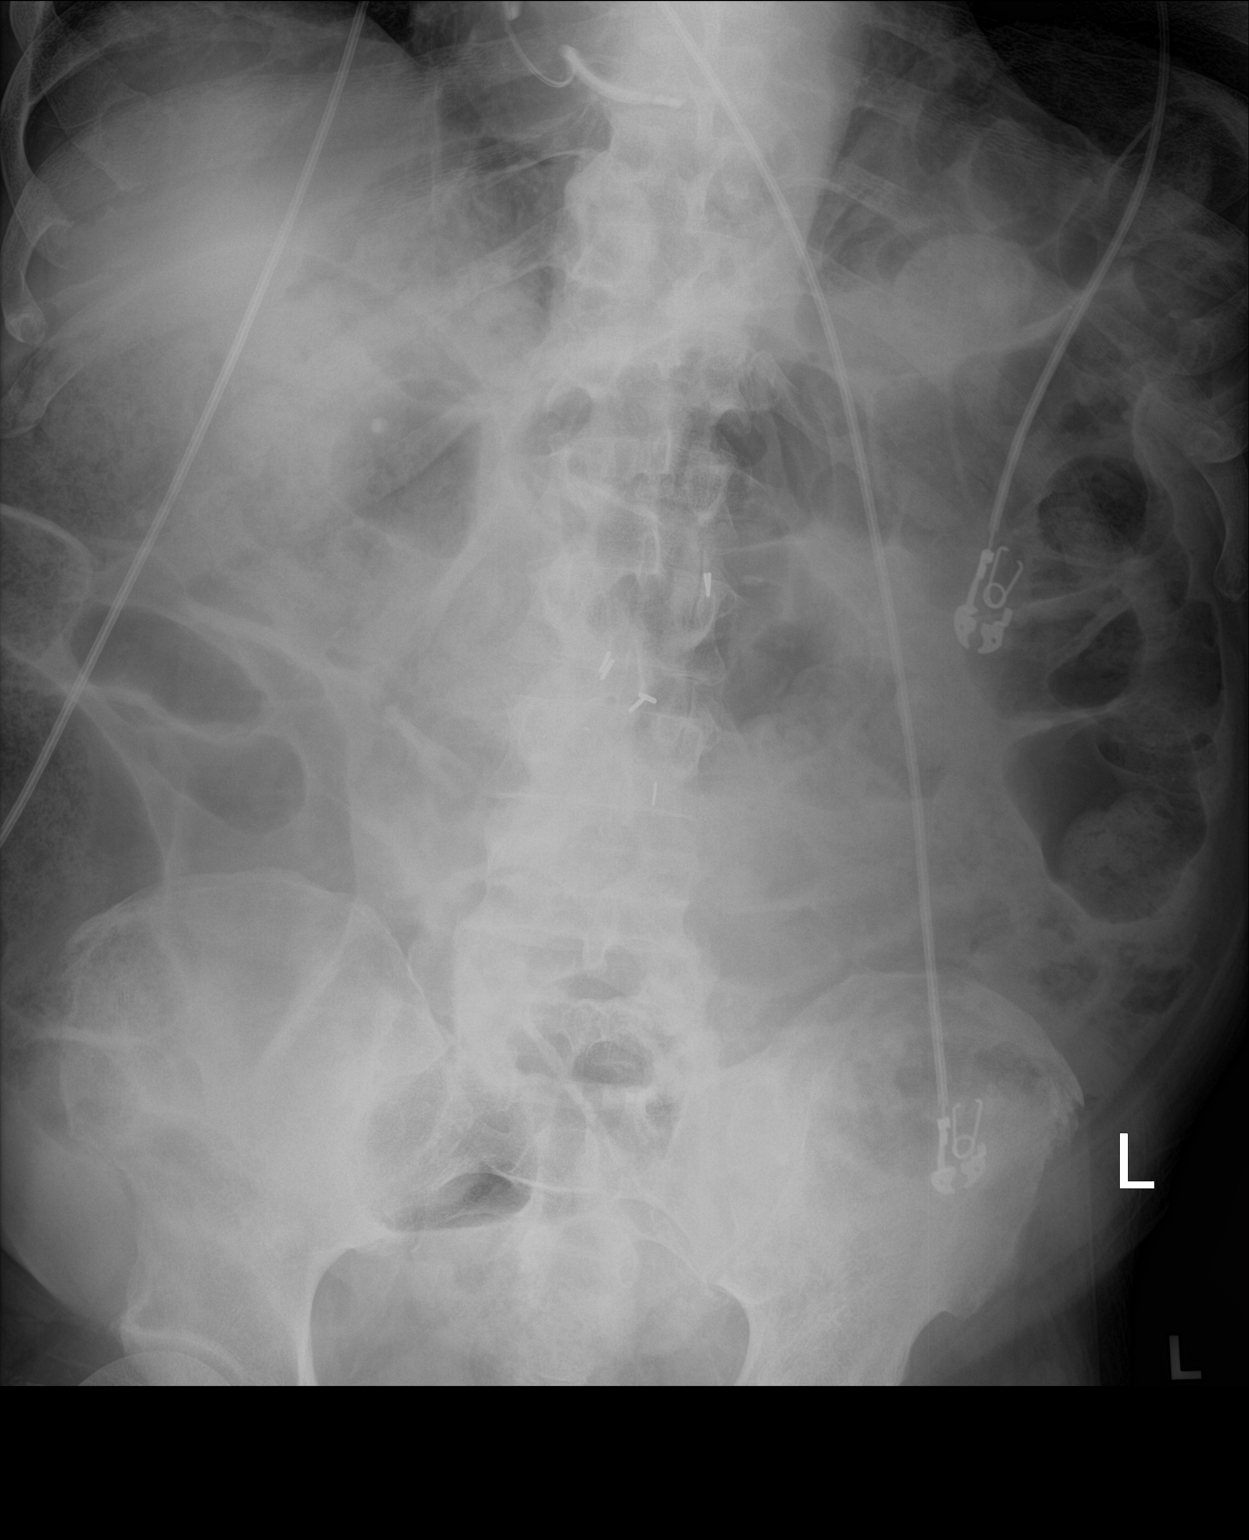

[1 of 1 positions shown; findings below may reference images not displayed]

FINDINGS: Surgical clips overlie the mid abdomen. Subcentimeter calcific
density overlying the right upper quadrant suggestive of
cholelithiasis.

Multiple mildly prominent gas-filled loops of predominantly large
bowel seen within the abdomen. Gas overlies the rectal vault.
Finding is nonspecific, but could reflect underlying ileus. No
significant small bowel dilatation is visible. No significant bowel
wall thickening. No visible free air on this limited supine view the
abdomen. No other soft tissue mass.

Degenerative changes noted within lower lumbar spine. Symmetric
sclerosis noted about the SI joints bilaterally. Few additional
surgical clips overlie the right hip.
IMPRESSION: 1. Multiple mildly prominent gas-filled loops of predominantly large
bowel within the abdomen, nonspecific, but could reflect an
underlying ileus. No significant small bowel dilatation or ovarian
evidence for mechanical obstruction.
2. Probable cholelithiasis.

## 2020-04-30 MED ORDER — INSULIN ASPART 100 UNIT/ML ~~LOC~~ SOLN
0.0000 [IU] | SUBCUTANEOUS | Status: DC
  Administered 2020-04-30 – 2020-05-01 (×3): 1 [IU] via SUBCUTANEOUS
  Administered 2020-05-01: 2 [IU] via SUBCUTANEOUS
  Administered 2020-05-01: 1 [IU] via SUBCUTANEOUS

## 2020-04-30 MED ORDER — ACETAMINOPHEN 325 MG PO TABS
650.0000 mg | ORAL_TABLET | Freq: Four times a day (QID) | ORAL | Status: DC | PRN
  Administered 2020-04-30 – 2020-05-02 (×2): 650 mg via ORAL
  Filled 2020-04-30 (×2): qty 2

## 2020-04-30 MED ORDER — CARVEDILOL 6.25 MG PO TABS
6.2500 mg | ORAL_TABLET | Freq: Every day | ORAL | Status: DC
  Administered 2020-04-30 – 2020-05-01 (×2): 6.25 mg via ORAL
  Filled 2020-04-30: qty 2
  Filled 2020-04-30: qty 1

## 2020-04-30 MED ORDER — ATORVASTATIN CALCIUM 80 MG PO TABS
80.0000 mg | ORAL_TABLET | Freq: Every day | ORAL | Status: DC
  Administered 2020-04-30 – 2020-05-02 (×3): 80 mg via ORAL
  Filled 2020-04-30: qty 8
  Filled 2020-04-30 (×2): qty 1

## 2020-04-30 MED ORDER — ACETAMINOPHEN 650 MG RE SUPP: 650.0000 mg | Freq: Four times a day (QID) | RECTAL | Status: DC | PRN

## 2020-04-30 MED ORDER — SODIUM CHLORIDE 0.9 % IV SOLN: INTRAVENOUS | Status: AC

## 2020-04-30 MED ORDER — PANTOPRAZOLE SODIUM 40 MG PO TBEC
40.0000 mg | DELAYED_RELEASE_TABLET | Freq: Every day | ORAL | Status: DC
  Administered 2020-04-30 – 2020-05-02 (×3): 40 mg via ORAL
  Filled 2020-04-30 (×3): qty 1

## 2020-04-30 MED ORDER — METOPROLOL TARTRATE 25 MG PO TABS
25.0000 mg | ORAL_TABLET | Freq: Every day | ORAL | Status: DC
  Administered 2020-04-30: 25 mg via ORAL
  Filled 2020-04-30 (×2): qty 1

## 2020-04-30 MED ORDER — AMIODARONE HCL 200 MG PO TABS
200.0000 mg | ORAL_TABLET | Freq: Every day | ORAL | Status: DC
  Administered 2020-04-30: 200 mg via ORAL
  Filled 2020-04-30 (×2): qty 1

## 2020-04-30 NOTE — Progress Notes (Signed)
PROGRESS NOTE  Peter Lambert [id number] DOB: [birth year] DOA: 04/29/2020 PCP: Redmond School, MD  HPI/Recap of past 24 hours: Peter Lambert is a 75 y.o. male with medical history significant of CAD status post PCI/DES to LAD in 05/2008, V. fib arrest/chronic systolic and diastolic CHF (EF [id number]) status post ICD implant, atrial flutter on chronic anticoagulation, hypertension, hyperlipidemia, PAD, type 2 diabetes, CKD stage III.  Patient had a follow-up visit with his cardiologist today after he was recently transitioned from Coumadin to Eliquis.  Patient took his first dose of Eliquis today and last dose of Coumadin was on 3/1 AM.  Labs checked at cardiology office revealed hemoglobin of 6.8 and he was sent to the ED.  Patient was seen in the ED for symptomatic anemia on 3/1 and hemoglobin was 6.1 at that time with heme positive stool.  INR was 6 and he was given vitamin K.  He was given a blood transfusion and admission was recommended at that time but he left AMA.  He is scheduled for a colonoscopy on 3/18.  Patient states he is chronically constipated and takes laxatives.  He has not had a bowel movement for the past 4 days.  He does not recall having any episodes of bloody bowel movements or dark stool.  He has not vomited any blood and denies abdominal pain.  Yesterday while taking his trash out he felt dizzy and weak.  He fell onto his knees but was able to get up and walk on his own.  He did not hit his head or lose consciousness.  Denies any pain in his knees or difficulty walking at present.  Denies chest pain, shortness of breath, or palpitations.  He is fully vaccinated against COVID including booster shot.  ED Course: Hemodynamically stable.  Hemoglobin 6.6.  INR 2.6.  1 unit PRBCs ordered.  ED physician discussed the case with Dr. Lance Sell. Patient was seen this morning by GI.  Plan for possible colonoscopy on Monday.  04/30/2020: Patient was seen and examined at his bedside  in the ED.  He denies any abdominal pain or nausea.  He reports no bowel movement since Tuesday 4 days ago.  Assessment/Plan: Principal Problem:   GI bleed Active Problems:   Atrial flutter (HCC)   Acute blood loss anemia   CAD (coronary artery disease)   AKI (acute kidney injury) (HCC)   Angiodysplasia of small intestine (HCC)  Symptomatic acute blood loss anemia secondary to suspected GI bleed. Patient has a history of GI bleed, duodenal AVM, internal hemorrhoids and diverticulosis in the entire examined colon. Presented with hemoglobin less than 7 requiring 1 unit PRBC transfusion. Hemoglobin this morning 7.1 from 6.6. Repeat H&H this afternoon. Transfuse hemoglobin less than 7. Possible colonoscopy on Monday.  AKI on CKD 3A suspect prerenal in setting of dehydration, volume loss from possible GI bleed. Baseline creatinine appears to be 1.42 with GFR of 51 Presented with creatinine of 2.07 with GFR of 33 Avoid nephrotoxic agents, hypotension, or dehydration. Monitor urine output Repeat renal panel in the morning.  Chronic constipation Reports no bowel movement since 4 days ago. Bowel prep per GI for possible colonoscopy on Monday.  Mild non-anion gap metabolic acidosis likely secondary to AKI Serum bicarb 21 and anion gap of 9 Repeat level in the morning.  Essential hypertension BP is currently at goal He is currently on Coreg 6.25 mg daily, amiodarone 200 mg daily, Lopressor p.o. 25 mg daily. Continue to monitor vital signs.  GERD Continue daily Protonix 40 mg.  Hyperlipidemia Continue Lipitor 80 mg daily.  Physical debility PT OT to assess Fall precautions.   Code Status: Full code.  Family Communication: None at bedside.  Disposition Plan: Likely will discharge to home   Consultants:  GI.  Procedures:  None  Possible colonoscopy on Monday, 05/02/2020.  Antimicrobials:  None.  DVT prophylaxis: SCDs  Status is:  Observation    Dispo:  Patient From: Home  Planned Disposition: Home  Anticipated discharge date: 05/03/2020.  Medically stable for discharge: No, ongoing management of acute blood loss anemia with suspected GI bleed.       Objective: Vitals:   04/30/20 1145 04/30/20 1215 04/30/20 1230 04/30/20 1245  BP: 120/64 (!) 112/54 (!) 123/50 109/66  Pulse:    60  Resp: '13 14 14 16  '$ Temp:      TempSrc:      SpO2:    96%    Intake/Output Summary (Last 24 hours) at 04/30/2020 1347 Last data filed at 04/30/2020 1035 Gross per 24 hour  Intake 945 ml  Output 550 ml  Net 395 ml   There were no vitals filed for this visit.  Exam:  . General: 75 y.o. year-old male well developed well nourished in no acute distress.  Alert and oriented x3. . Cardiovascular: Regular rate and rhythm with no rubs or gallops.  No thyromegaly or JVD noted.   Marland Kitchen Respiratory: Clear to auscultation with no wheezes or rales. Good inspiratory effort. . Abdomen: Soft nontender nondistended with normal bowel sounds x4 quadrants. . Musculoskeletal: Trace lower extremity edema bilaterally. . Skin: No ulcerative lesions noted or rashes. . Psychiatry: Mood is appropriate for condition and setting   Data Reviewed: CBC: Recent Labs  Lab 04/26/20 1649 04/27/20 0450 04/29/20 1059 04/29/20 2010 04/30/20 0110  WBC 9.5  --  9.9 9.4  --   NEUTROABS 6.7  --   --   --   --   HGB 6.1* 7.7* 6.8* 6.6* 7.1*  HCT 19.5* 23.8* 19.8* 21.1* 21.2*  MCV 98.0  --  90 98.1  --   PLT 169  --  193 198  --    Basic Metabolic Panel: Recent Labs  Lab 04/26/20 1649 04/29/20 2010  NA 137 137  K 5.1 4.1  CL 106 107  CO2 24 21*  GLUCOSE 114* 122*  BUN 53* 41*  CREATININE 1.76* 2.07*  CALCIUM 8.5* 8.5*   GFR: Estimated Creatinine Clearance: 29.4 mL/min (A) (by C-G formula based on SCr of 2.07 mg/dL (H)). Liver Function Tests: Recent Labs  Lab 04/26/20 1649 04/29/20 2010  AST 16 20  ALT 17 18  ALKPHOS 42 57  BILITOT 0.3  0.8  PROT 6.4* 6.1*  ALBUMIN 3.5 3.5   No results for input(s): LIPASE, AMYLASE in the last 168 hours. No results for input(s): AMMONIA in the last 168 hours. Coagulation Profile: Recent Labs  Lab 04/26/20 1649 04/27/20 0450 04/29/20 1045 04/29/20 2240  INR 6.2* 5.3* 2.6 2.7*   Cardiac Enzymes: No results for input(s): CKTOTAL, CKMB, CKMBINDEX, TROPONINI in the last 168 hours. BNP (last 3 results) No results for input(s): PROBNP in the last 8760 hours. HbA1C: Recent Labs    04/30/20 0108  HGBA1C 6.1*   CBG: Recent Labs  Lab 04/30/20 0230 04/30/20 0817 04/30/20 1253  GLUCAP 133* 100* 98   Lipid Profile: No results for input(s): CHOL, HDL, LDLCALC, TRIG, CHOLHDL, LDLDIRECT in the last 72 hours. Thyroid Function Tests: No results  for input(s): TSH, T4TOTAL, FREET4, T3FREE, THYROIDAB in the last 72 hours. Anemia Panel: No results for input(s): VITAMINB12, FOLATE, FERRITIN, TIBC, IRON, RETICCTPCT in the last 72 hours. Urine analysis:    Component Value Date/Time   COLORURINE YELLOW 04/30/2020 0109   APPEARANCEUR CLEAR 04/30/2020 0109   LABSPEC 1.015 04/30/2020 0109   PHURINE 5.0 04/30/2020 0109   GLUCOSEU NEGATIVE 04/30/2020 0109   HGBUR NEGATIVE 04/30/2020 0109   BILIRUBINUR NEGATIVE 04/30/2020 0109   KETONESUR NEGATIVE 04/30/2020 0109   PROTEINUR NEGATIVE 04/30/2020 0109   NITRITE NEGATIVE 04/30/2020 0109   LEUKOCYTESUR SMALL (A) 04/30/2020 0109   Sepsis Labs: '@LABRCNTIP'$ (procalcitonin:4,lacticidven:4)  )No results found for this or any previous visit (from the past 240 hour(s)).    Studies: DG Abd 1 View  Result Date: 04/30/2020 CLINICAL DATA:  Initial evaluation for abdominal distension. EXAM: ABDOMEN - 1 VIEW COMPARISON:  Prior study from 02/03/2020. FINDINGS: Surgical clips overlie the mid abdomen. Subcentimeter calcific density overlying the right upper quadrant suggestive of cholelithiasis. Multiple mildly prominent gas-filled loops of predominantly  large bowel seen within the abdomen. Gas overlies the rectal vault. Finding is nonspecific, but could reflect underlying ileus. No significant small bowel dilatation is visible. No significant bowel wall thickening. No visible free air on this limited supine view the abdomen. No other soft tissue mass. Degenerative changes noted within lower lumbar spine. Symmetric sclerosis noted about the SI joints bilaterally. Few additional surgical clips overlie the right hip. IMPRESSION: 1. Multiple mildly prominent gas-filled loops of predominantly large bowel within the abdomen, nonspecific, but could reflect an underlying ileus. No significant small bowel dilatation or ovarian evidence for mechanical obstruction. 2. Probable cholelithiasis. Electronically Signed   By: Jeannine Boga M.D.   On: 04/30/2020 01:49    Scheduled Meds: . amiodarone  200 mg Oral Daily  . atorvastatin  80 mg Oral Daily  . carvedilol  6.25 mg Oral Q breakfast  . insulin aspart  0-9 Units Subcutaneous Q4H  . metoprolol tartrate  25 mg Oral Daily  . pantoprazole  40 mg Oral Daily    Continuous Infusions: . sodium chloride       LOS: 0 days     Kayleen Memos, MD [facility] Pager [phone]  If 7PM-7AM, please contact night-coverage [email or web address] Password [email or web address] 04/30/2020, 1:47 PM

## 2020-04-30 NOTE — Evaluation (Signed)
Physical Therapy Evaluation Patient Details Name: Peter Lambert MRN: [id number] DOB: 02/17/1946 Today's Date: 04/30/2020   History of Present Illness  The pt is a 75 yo male presenting 3/4 with HGB of 6.8 and is s/p 1 unit PRBC. Pt recently presented 3/1 with similar sx, but left AMA 3/2 instead of being admitted. PMH includes: HTN, NSTEMI, VF arrest s/p ICD, CHF, afib, PAD, HLD, DM II, CKD III, and anemia.    Clinical Impression  Pt in bed upon arrival of PT, agreeable to evaluation at this time. Prior to admission the pt was completely independent without need for AD or assist. Pt living with his son in a home with level entry, recently retired, now enjoys working in the yard. The pt now presents with minor limitations in dynamic stability and endurance due to above dx, but was able to complete all transfers, gait in the room, and some higher-level balance challenges without AD or need for physical assistance. The pt states he is at his baseline level of function, and feels confident with his mobility. Pt has no acute PT needs at this time, discussed mobilizing safely in room with line management, and pt verbalized agreement. Will plan for one additional visit following possible colonoscopy Monday to ensure pt remains at baseline following procedure.     Follow Up Recommendations No PT follow up;Supervision - Intermittent    Equipment Recommendations  None recommended by PT    Recommendations for Other Services       Precautions / Restrictions Precautions Precautions: None Restrictions Weight Bearing Restrictions: No      Mobility  Bed Mobility Overal bed mobility: Independent                  Transfers Overall transfer level: Independent Equipment used: None             General transfer comment: pt able to complete without assist, no AD, no evidence of instability  Ambulation/Gait Ambulation/Gait assistance: Supervision Gait Distance (Feet): 15 Feet Assistive  device: None Gait Pattern/deviations: WFL(Within Functional Limits)     General Gait Details: functional, supervision only for IV management      Balance Overall balance assessment: Mild deficits observed, not formally tested (pt able to complete tandem walking, backwards walking without AD, minG with tandem steps, but safe with gait in room)                                           Pertinent Vitals/Pain Pain Assessment: No/denies pain    Home Living Family/patient expects to be discharged to:: Private residence Living Arrangements: Children Available Help at Discharge: Family;Available PRN/intermittently Type of Home: House Home Access: Level entry     Home Layout: One level Home Equipment: Cane - single point      Prior Function Level of Independence: Independent         Comments: normally independent with mobility, driving, all without AD. No falls, but sits down when feeling weak     Hand Dominance   Dominant Hand: Right    Extremity/Trunk Assessment   Upper Extremity Assessment Upper Extremity Assessment: Overall WFL for tasks assessed    Lower Extremity Assessment Lower Extremity Assessment: Overall WFL for tasks assessed    Cervical / Trunk Assessment Cervical / Trunk Assessment: Normal  Communication   Communication: No difficulties  Cognition Arousal/Alertness: Awake/alert Behavior During Therapy: WFL for  tasks assessed/performed Overall Cognitive Status: Within Functional Limits for tasks assessed                                        General Comments General comments (skin integrity, edema, etc.): VSS on RA    Exercises Other Exercises Other Exercises: tandem walking 3 x10 ft with minG through HHA Other Exercises: backwards walking 3 x 10 ft with minG through HHA, no LOB   Assessment/Plan    PT Assessment Patient needs continued PT services  PT Problem List Decreased activity tolerance;Decreased  balance       PT Treatment Interventions Gait training;Functional mobility training;Stair training;Therapeutic activities;Therapeutic exercise;Balance training;Patient/family education    PT Goals (Current goals can be found in the Care Plan section)  Acute Rehab PT Goals Patient Stated Goal: return home PT Goal Formulation: With patient Time For Goal Achievement: 05/14/20 Potential to Achieve Goals: Good    Frequency Other (Comment) (re-evaluate after colonoscopy monday (3/7))    AM-PAC PT "6 Clicks" Mobility  Outcome Measure Help needed turning from your back to your side while in a flat bed without using bedrails?: None Help needed moving from lying on your back to sitting on the side of a flat bed without using bedrails?: None Help needed moving to and from a bed to a chair (including a wheelchair)?: None Help needed standing up from a chair using your arms (e.g., wheelchair or bedside chair)?: None Help needed to walk in hospital room?: A Little Help needed climbing 3-5 steps with a railing? : A Little 6 Click Score: 22    End of Session Equipment Utilized During Treatment: Gait belt Activity Tolerance: Patient tolerated treatment well Patient left: in chair;with call bell/phone within reach Nurse Communication: Mobility status PT Visit Diagnosis: Other abnormalities of gait and mobility (R26.89)    Time: [id number] PT Time Calculation (min) (ACUTE ONLY): 17 min   Charges:   PT Evaluation $PT Eval Low Complexity: 1 Low          Karma Ganja, PT, DPT   Acute Rehabilitation Department Pager #: [phone]  Otho Bellows 04/30/2020, 4:20 PM

## 2020-04-30 NOTE — Consult Note (Addendum)
Referring Provider: Dr. Shela Leff Primary Care Physician:  Peter School, MD Primary Gastroenterologist:  Susanne Greenhouse   Reason for Consultation:  Anemia   HPI: Peter Lambert is a 75 y.o. male is a 75 year old male with a past medical history significant for hypertension, NSTEMI s/p DES 2010 and 08/2019, VF arrest 08/2019 s/p ICD, chronic diastolic and systolic CHF, atrial fibrillation on Eliquis, PAD, hyperlipidemia, diabetes mellitus type 2, chronic kidney disease stage III and iron deficiency anemia.  He has evidence of a midline exploratory laparotomy scar, it is unclear what surgery was performed in the 1990's (patient stated possible due to an artery blockage).   He was admitted to [facility] 12/2019 with acute on chronic anemia with heme positive stool.  He was found to have profound anemia at that time with admission hemoglobin 4.9.  He was on Eliquis at that time.  He received 2 units of packed red blood cells.  An EGD was completed by Dr. Dorien Chihuahua which showed a medium-sized paraesophageal hernia, a single nonbleeding angiectasia in the duodenum and extrinsic compression in the mid esophagus was noted.  A colonoscopy was done 11/12/202 which resulted in a poor bowel preparation, diverticulosis was noted throughout the colon, nonbleeding internal hemorrhoids were noted.  A repeat colonoscopy in 3 months was recommended.  He was followed by Dr. Jenetta Downer as an outpatient, last seen in office 04/11/2020 with persistent IDA.  At that time, his hemoglobin was stable on oral iron and a repeat EGD and colonoscopy was scheduled 05/13/2020.  His mother was diagnosed with colon cancer in her 99s and brother was diagnosed with colon cancer in his 59s.  He presented to [facility] ED 04/26/2020 with complaints of generalized weakness with an episode of left-sided arm and leg shakiness which occurred while attempting to get out of his truck.  Labs in the ED showed a hemoglobin level  of 6.1.  BUN 53.  Creatinine 1.76.  INR 6.2.  Eliquis was recently switched to Coumadin due to cost.  FOBT was positive.  He received vitamin K and 2 units of packed red blood cells.  Repeat hemoglobin posttransfusion was 7.7.  INR 5.3.  Hospital admission was recommended but the patient refused and he left AMA.    He was seen in the atrial fibrillation clinic on 3/4 and Coumadin was discontinued and he was switched back to Eliquis.  A repeat hemoglobin was down to 6.8 and he was sent directly to the ED for admission with recommendations for inpatient endoscopic evaluation.  He agreed to go to the hospital and he presented to [facility] ED.  Labs in the ED showed a Hemoglobin level of 6.6.  BUN 41.  Creatinine 2.07.  Normal LFTs.  INR 2.7.  He was transfused 1 unit of packed red blood cells.  Posttransfusion hemoglobin 7.1.  SARS coronavirus 2 test ordered by the hospitalist, not yet completed.  Currently, he denies having any chest pain, palpitations or dizziness.  No cough or shortness of breath.  He stated he felt fine yesterday when he was advised by Dr. Alvester Chou to present to the ED due to his low hemoglobin level.  He passes a formed bowel movement every day or every other day.  He does not look at his stool therefore he cannot verify the color of his stool.  No obvious blood or black stool in the toilet tissue.  He takes MiraLAX every other day and Ex-Lax once every few weeks.  He denies  having any nausea or vomiting.  No dysphagia or heartburn.  He takes aspirin 81 mg daily.  His last Coumadin was on 3/1 he took Eliquis x1 on 3/4.  EGD 01/06/2020 by Dr. Laural Golden: - Normal hypopharynx. - Extrinsic compression in the mid esophagus. - Z-line irregular, 42 cm from the incisors. - Medium-sized paraesophageal hernia. - A single non-bleeding angioectasia in the duodenum. - Normal second portion of the duodenum. - No specimens collected.  Colonoscopy 01/08/2020: - Preparation of the colon was  inadequate. - Non-bleeding internal hemorrhoids. - Diverticulosis in the entire examined colon. - Stool in the entire examined colon. - No specimens collected.  Upper GI series 02/03/2020:  -No evidence of a paraesophageal hernia.   -Tiny hiatal hernia with minimal gastroesophageal reflux otherwise was normal.   Past Medical History:  Diagnosis Date  . Coronary artery disease    a. s/p NSTEMI in 2010 with DES to proximal LAD with D1 jailed and angioplasty alone to ostium  . Hyperlipidemia   . Hypertension   . Hypertension   . Peripheral arterial disease ([place])   . Tobacco abuse   . Type 2 diabetes mellitus ([place])     Past Surgical History:  Procedure Laterality Date  . CARDIAC SURGERY    . COLONOSCOPY WITH PROPOFOL N/A 01/08/2020   Procedure: COLONOSCOPY WITH PROPOFOL;  Surgeon: Eloise [name], DO;  [facility];  Service: Endoscopy;  Laterality: N/A;  . CORONARY STENT INTERVENTION N/A 09/03/2019   Procedure: CORONARY STENT INTERVENTION;  Surgeon: Burnell Blanks, MD;  Location: [place] [facility];  Service: Cardiovascular;  Laterality: N/A;  . CORONARY STENT PLACEMENT    . ESOPHAGOGASTRODUODENOSCOPY (EGD) WITH PROPOFOL N/A 01/06/2020   Procedure: ESOPHAGOGASTRODUODENOSCOPY (EGD) WITH PROPOFOL;  Surgeon: Rogene Houston, MD;  Location: [facility];  Service: Endoscopy;  Laterality: N/A;  . ICD IMPLANT N/A 09/04/2019   Procedure: ICD IMPLANT;  Surgeon: Evans Lance, MD;  Location: [place] [facility];  Service: Cardiovascular;  Laterality: N/A;  . RIGHT/LEFT HEART CATH AND CORONARY ANGIOGRAPHY N/A 09/03/2019   Procedure: RIGHT/LEFT HEART CATH AND CORONARY ANGIOGRAPHY;  Surgeon: Jolaine Artist, MD;  Location: [place] [facility];  Service: Cardiovascular;  Laterality: N/A;    Prior to Admission medications   Medication Sig Start Date End Date Taking? Authorizing Provider  acetaminophen (TYLENOL) 325 MG tablet Take 2 tablets (650 mg total) by mouth every 6  (six) hours as needed for mild pain, fever or headache (or Fever >/= 101). 04/28/19  Yes Emokpae, Courage, MD  albuterol (VENTOLIN HFA) 108 (90 Base) MCG/ACT inhaler Inhale 2 puffs into the lungs every 4 (four) hours as needed for wheezing or shortness of breath. 04/28/19  Yes Emokpae, Courage, MD  amiodarone (PACERONE) 200 MG tablet Take 1 tablet (200 mg total) by mouth daily. 01/09/20  Yes Johnson, Clanford L, MD  apixaban (ELIQUIS) 5 MG TABS tablet Take 1 tablet (5 mg total) by mouth 2 (two) times daily. 04/29/20  Yes Lorretta Harp, MD  aspirin EC 81 MG tablet Take 81 mg by mouth daily. Swallow whole.   Yes [provider]  atorvastatin (LIPITOR) 80 MG tablet Take 80 mg by mouth daily.   Yes [provider]  carvedilol (COREG) 6.25 MG tablet TAKE 1 TABLET BY MOUTH 2 TIMES DAILY WITH A MEAL. Patient taking differently: Take 6.25 mg by mouth daily. 04/25/20  Yes Cleaver, Jossie Ng, NP  cyanocobalamin (,VITAMIN B-12,) 1000 MCG/ML injection Inject 1,000 mcg into  the muscle every 30 (thirty) days.   Yes [provider]  ferrous sulfate (FERROUSUL) 325 (65 FE) MG tablet Take 1 tablet (325 mg total) by mouth daily with breakfast. 01/19/20  Yes Rehman, Mechele Dawley, MD  fluocinonide cream (LIDEX) [id number] % Apply 1 application topically 2 (two) times daily as needed (rash). 12/23/19  Yes [provider]  furosemide (LASIX) 20 MG tablet Take 20 mg by mouth daily.   Yes [provider]  guaiFENesin (MUCINEX) 600 MG 12 hr tablet Take 600 mg by mouth 2 (two) times daily as needed for cough or to loosen phlegm.   Yes [provider]  isosorbide dinitrate (ISORDIL) 30 MG tablet Take 30 mg by mouth daily.   Yes [provider]  losartan (COZAAR) 25 MG tablet Take 25 mg by mouth daily.   Yes [provider]  lubiprostone (AMITIZA) 8 MCG capsule Take 1 capsule (8 mcg total) by mouth 2 (two) times daily with a meal. Patient taking differently: Take 8 mcg by  mouth daily with breakfast. 01/19/20  Yes Rehman, Mechele Dawley, MD  Melatonin 3 MG CAPS Take 6 mg by mouth at bedtime as needed (sleep).   Yes [provider]  metoprolol tartrate (LOPRESSOR) 25 MG tablet Take 25 mg by mouth daily.   Yes [provider]  nitroGLYCERIN (NITROSTAT) 0.4 MG SL tablet PLACE 1 TABLET UNDER THE TONGUE EVERY 5 MINUTES AS NEEDED FOR CHEST PAIN Patient taking differently: Place 0.4 mg under the tongue every 5 (five) minutes as needed. PLACE 1 TABLET UNDER THE TONGUE EVERY 5 MINUTES AS NEEDED FOR CHEST PAIN 11/26/19  Yes Lorretta Harp, MD  omeprazole (PRILOSEC) 20 MG capsule Take 20 mg by mouth daily.   Yes [provider]  potassium chloride SA (KLOR-CON) 20 MEQ tablet Take 20 mEq by mouth daily.   Yes [provider]  spironolactone (ALDACTONE) 50 MG tablet Take 50 mg by mouth daily.   Yes [provider]  traZODone (DESYREL) 50 MG tablet Take 50 mg by mouth at bedtime as needed for sleep.   Yes [provider]  vitamin B-12 1000 MCG tablet Take 1 tablet (1,000 mcg total) by mouth daily. 01/09/20  Yes Johnson, Clanford L, MD  potassium chloride (KLOR-CON) 20 MEQ packet TAKE 1 TABLET BY MOUTH EVERY DAY 12/24/12 07/14/13  Lorretta Harp, MD    Current Facility-Administered Medications  Medication Dose Route Frequency Provider Last Rate Last Admin  . 0.9 %  sodium chloride infusion  10 mL/hr Intravenous Once McDonald, Mia A, PA-C      . 0.9 %  sodium chloride infusion   Intravenous Continuous Shela Leff, MD 75 mL/hr at 04/30/20 0258 New Bag at 04/30/20 0258  . acetaminophen (TYLENOL) tablet 650 mg  650 mg Oral Q6H PRN Shela Leff, MD       Or  . acetaminophen (TYLENOL) suppository 650 mg  650 mg Rectal Q6H PRN Shela Leff, MD      . amiodarone (PACERONE) tablet 200 mg  200 mg Oral Daily Shela Leff, MD      . atorvastatin (LIPITOR) tablet 80 mg  80 mg Oral Daily Shela Leff, MD       . carvedilol (COREG) tablet 6.25 mg  6.25 mg Oral Q breakfast Shela Leff, MD      . insulin aspart (novoLOG) injection 0-9 Units  0-9 Units Subcutaneous Q4H Shela Leff, MD   1 Units at 04/30/20 0302  . metoprolol tartrate (LOPRESSOR) tablet  25 mg  25 mg Oral Daily Shela Leff, MD      . pantoprazole (PROTONIX) EC tablet 40 mg  40 mg Oral Daily Shela Leff, MD       Current Outpatient Medications  Medication Sig Dispense Refill  . acetaminophen (TYLENOL) 325 MG tablet Take 2 tablets (650 mg total) by mouth every 6 (six) hours as needed for mild pain, fever or headache (or Fever >/= 101). 30 tablet 0  . albuterol (VENTOLIN HFA) 108 (90 Base) MCG/ACT inhaler Inhale 2 puffs into the lungs every 4 (four) hours as needed for wheezing or shortness of breath. 18 g 3  . amiodarone (PACERONE) 200 MG tablet Take 1 tablet (200 mg total) by mouth daily.    Marland Kitchen apixaban (ELIQUIS) 5 MG TABS tablet Take 1 tablet (5 mg total) by mouth 2 (two) times daily. 60 tablet 5  . aspirin EC 81 MG tablet Take 81 mg by mouth daily. Swallow whole.    Marland Kitchen atorvastatin (LIPITOR) 80 MG tablet Take 80 mg by mouth daily.    . carvedilol (COREG) 6.25 MG tablet TAKE 1 TABLET BY MOUTH 2 TIMES DAILY WITH A MEAL. (Patient taking differently: Take 6.25 mg by mouth daily.) 180 tablet 2  . cyanocobalamin (,VITAMIN B-12,) 1000 MCG/ML injection Inject 1,000 mcg into the muscle every 30 (thirty) days.    . ferrous sulfate (FERROUSUL) 325 (65 FE) MG tablet Take 1 tablet (325 mg total) by mouth daily with breakfast.  0  . fluocinonide cream (LIDEX) [id number] % Apply 1 application topically 2 (two) times daily as needed (rash).    . furosemide (LASIX) 20 MG tablet Take 20 mg by mouth daily.    Marland Kitchen guaiFENesin (MUCINEX) 600 MG 12 hr tablet Take 600 mg by mouth 2 (two) times daily as needed for cough or to loosen phlegm.    . isosorbide dinitrate (ISORDIL) 30 MG tablet Take 30 mg by mouth daily.    Marland Kitchen losartan (COZAAR) 25 MG  tablet Take 25 mg by mouth daily.    Marland Kitchen lubiprostone (AMITIZA) 8 MCG capsule Take 1 capsule (8 mcg total) by mouth 2 (two) times daily with a meal. (Patient taking differently: Take 8 mcg by mouth daily with breakfast.) 60 capsule 5  . Melatonin 3 MG CAPS Take 6 mg by mouth at bedtime as needed (sleep).    . metoprolol tartrate (LOPRESSOR) 25 MG tablet Take 25 mg by mouth daily.    . nitroGLYCERIN (NITROSTAT) 0.4 MG SL tablet PLACE 1 TABLET UNDER THE TONGUE EVERY 5 MINUTES AS NEEDED FOR CHEST PAIN (Patient taking differently: Place 0.4 mg under the tongue every 5 (five) minutes as needed. PLACE 1 TABLET UNDER THE TONGUE EVERY 5 MINUTES AS NEEDED FOR CHEST PAIN) 25 tablet 3  . omeprazole (PRILOSEC) 20 MG capsule Take 20 mg by mouth daily.    . potassium chloride SA (KLOR-CON) 20 MEQ tablet Take 20 mEq by mouth daily.    Marland Kitchen spironolactone (ALDACTONE) 50 MG tablet Take 50 mg by mouth daily.    . traZODone (DESYREL) 50 MG tablet Take 50 mg by mouth at bedtime as needed for sleep.    . vitamin B-12 1000 MCG tablet Take 1 tablet (1,000 mcg total) by mouth daily. 30 tablet 2    Allergies as of 04/29/2020  . (No Known Allergies)    Family History  Problem Relation Age of Onset  . Cancer Mother        not sure location   .  Colon cancer Neg Hx   . Stomach cancer Neg Hx     Social History   Socioeconomic History  . Marital status: Divorced    Spouse name: Not on file  . Number of children: Not on file  . Years of education: Not on file  . Highest education level: Not on file  Occupational History  . Not on file  Tobacco Use  . Smoking status: Former Smoker    Packs/day: 0.50    Years: 52.00    Pack years: 26.00    Types: Cigarettes  . Smokeless tobacco: Never Used  Vaping Use  . Vaping Use: Never used  Substance and Sexual Activity  . Alcohol use: No  . Drug use: No    Review of Systems: Gen: Denies fever, sweats or chills. No weight loss.  CV: Denies chest pain, palpitations or  edema. Resp: Denies cough, shortness of breath of hemoptysis.  GI: Denies heartburn, dysphagia, stomach or lower abdominal pain. No diarrhea or constipation. No rectal bleeding or melena.   GU : Denies urinary burning, blood in urine, increased urinary frequency or incontinence. MS: Denies joint pain, muscles aches or weakness. Derm: Denies rash, itchiness, skin lesions or unhealing ulcers. Psych: Denies depression, anxiety or memory loss. Heme: Denies easy bruising, bleeding. Neuro:  Denies headaches, dizziness or paresthesias. Endo:  Denies any problems with DM, thyroid or adrenal function.  Physical Exam: Vital signs in last 24 hours: Temp:  [98.4 F (36.9 C)-98.9 F (37.2 C)] 98.5 F (36.9 C) (03/05 0138) Pulse Rate:  [65-113] 69 (03/05 0700) Resp:  [11-26] 13 (03/05 0700) BP: (100-139)/(50-79) 139/61 (03/05 0700) SpO2:  [94 %-100 %] 98 % (03/05 0700)   General:  Alert 75 year old male in no acute distress. Head:  Normocephalic and atraumatic. Eyes:  No scleral icterus. Conjunctiva pink. Ears:  Normal auditory acuity. Nose:  No deformity, discharge or lesions. Mouth: Absent dentition.  No ulcers or lesions.  Neck:  Supple. No lymphadenopathy or thyromegaly.  Lungs: Breath sounds clear throughout. Heart: Regular rate and rhythm, no murmurs. Abdomen: Protuberant, distended.  Nontender.  Positive bowel sounds to all 4 quadrants.  Extensive midline abdominal scar intact.  No obvious HSM. Rectal: Deferred. Musculoskeletal:  Symmetrical without gross deformities.  Pulses:  Normal pulses noted. Extremities:  Without clubbing or edema. Neurologic:  Alert and  oriented x4. No focal deficits.  Skin:  Intact without significant lesions or rashes. Psych:  Alert and cooperative. Normal mood and affect.   Lab Results: Recent Labs    04/29/20 1059 04/29/20 2010 04/30/20 0110  WBC 9.9 9.4  --   HGB 6.8* 6.6* 7.1*  HCT 19.8* 21.1* 21.2*  PLT 193 198  --    BMET Recent Labs     04/29/20 2010  NA 137  K 4.1  CL 107  CO2 21*  GLUCOSE 122*  BUN 41*  CREATININE 2.07*  CALCIUM 8.5*   LFT Recent Labs    04/29/20 2010  PROT 6.1*  ALBUMIN 3.5  AST 20  ALT 18  ALKPHOS 57  BILITOT 0.8   PT/INR Recent Labs    04/29/20 1045 04/29/20 2240  LABPROT  --  27.6*  INR 2.6 2.7*      Studies/Results: DG Abd 1 View  Result Date: 04/30/2020 CLINICAL DATA:  Initial evaluation for abdominal distension. EXAM: ABDOMEN - 1 VIEW COMPARISON:  Prior study from 02/03/2020. FINDINGS: Surgical clips overlie the mid abdomen. Subcentimeter calcific density overlying the right upper quadrant suggestive of cholelithiasis.  Multiple mildly prominent gas-filled loops of predominantly large bowel seen within the abdomen. Gas overlies the rectal vault. Finding is nonspecific, but could reflect underlying ileus. No significant small bowel dilatation is visible. No significant bowel wall thickening. No visible free air on this limited supine view the abdomen. No other soft tissue mass. Degenerative changes noted within lower lumbar spine. Symmetric sclerosis noted about the SI joints bilaterally. Few additional surgical clips overlie the right hip. IMPRESSION: 1. Multiple mildly prominent gas-filled loops of predominantly large bowel within the abdomen, nonspecific, but could reflect an underlying ileus. No significant small bowel dilatation or ovarian evidence for mechanical obstruction. 2. Probable cholelithiasis. Electronically Signed   By: Jeannine Boga M.D.   On: 04/30/2020 01:49    IMPRESSION/PLAN:  26.  75 year old male presented to the ED with recurrent acute on chronic anemia with positive FOBT and anticoagulation for atrial fibrillation. EGD 12/2019 showed a single nonbleeding angiectasia in the duodenum and extrinsic compression in the mid esophagus and subsequent upper GI series was negative for paraesophageal hernia.  A colonoscopy 01/08/2020 was incomplete due to a poor  bowel preparation but showed evidence of diverticulosis throughout the colon and internal hemorrhoids.  Hemoglobin 6.8.  He was transfused 1 unit of packed red blood cells.  Posttransfusion hemoglobin 7.1.  FOBT positive.  No overt GI bleeding at this time. -Clear liquids -EGD and colonoscopy when INR stable -Transfuse for hemoglobin less < 8 -H/H Q 6 hours x 24 hours -PPI 40 mg po every 24 hours -Consider CTAP with moderate abdominal distention on exam -Further recommendations per Dr. Carlean Purl  2.  Atrial fibrillation, previously on Eliquis then recently switched to Coumadin due to cost then switched back to Eliquis on 3/4.  Last dose of Coumadin was on 3/1 in the AM.  Last dose of Eliquis was 3/4.  3.  History of coronary artery disease, s/p DES x 2 in 2010 and 2021, MI, VF arrest s/p ICD placement 08/2019.  Diastolic and systolic CHF.  4.  Peripheral arterial disease  5.  Diabetes mellitus type 2  6. Chronic kidney disease stage III   Noralyn Pick  04/30/2020 09:00AM    [facility] Attending   I have taken an interval history, reviewed the chart and examined the patient. I agree with the Advanced Practitioner's note, impression and recommendations.   He has a heme positive anemia in the setting of anticoagulation and incomplete work-up due to poor prep before. Certainly he may be anemic and bleeding from AVMs as 1 of which was seen in the duodenum at previous EGD. Additionally he could have Cameron erosions which can be evanescent and difficult to see, given that he has a paraesophageal hiatal hernia.  We will assess him again tomorrow if his INR is progressing properly we'll plan for a prep for Dr. Benson Norway or Collene Mares to do colonoscopy on Monday. We are covering for them for the unassigned service.   Gatha Mayer, MD, [facility] 04/30/2020 11:36 AM  [id number]

## 2020-05-01 DIAGNOSIS — K31811 Angiodysplasia of stomach and duodenum with bleeding: Secondary | ICD-10-CM | POA: Diagnosis present

## 2020-05-01 DIAGNOSIS — R710 Precipitous drop in hematocrit: Secondary | ICD-10-CM | POA: Diagnosis not present

## 2020-05-01 DIAGNOSIS — I4891 Unspecified atrial fibrillation: Secondary | ICD-10-CM | POA: Diagnosis present

## 2020-05-01 DIAGNOSIS — Z79899 Other long term (current) drug therapy: Secondary | ICD-10-CM | POA: Diagnosis not present

## 2020-05-01 DIAGNOSIS — E785 Hyperlipidemia, unspecified: Secondary | ICD-10-CM | POA: Diagnosis present

## 2020-05-01 DIAGNOSIS — Z7901 Long term (current) use of anticoagulants: Secondary | ICD-10-CM | POA: Diagnosis not present

## 2020-05-01 DIAGNOSIS — I252 Old myocardial infarction: Secondary | ICD-10-CM | POA: Diagnosis not present

## 2020-05-01 DIAGNOSIS — K5909 Other constipation: Secondary | ICD-10-CM | POA: Diagnosis present

## 2020-05-01 DIAGNOSIS — Z8674 Personal history of sudden cardiac arrest: Secondary | ICD-10-CM | POA: Diagnosis not present

## 2020-05-01 DIAGNOSIS — Z7982 Long term (current) use of aspirin: Secondary | ICD-10-CM | POA: Diagnosis not present

## 2020-05-01 DIAGNOSIS — E1122 Type 2 diabetes mellitus with diabetic chronic kidney disease: Secondary | ICD-10-CM | POA: Diagnosis present

## 2020-05-01 DIAGNOSIS — E1151 Type 2 diabetes mellitus with diabetic peripheral angiopathy without gangrene: Secondary | ICD-10-CM | POA: Diagnosis present

## 2020-05-01 DIAGNOSIS — R195 Other fecal abnormalities: Secondary | ICD-10-CM | POA: Diagnosis not present

## 2020-05-01 DIAGNOSIS — E872 Acidosis: Secondary | ICD-10-CM | POA: Diagnosis present

## 2020-05-01 DIAGNOSIS — R140 Abdominal distension (gaseous): Secondary | ICD-10-CM | POA: Diagnosis present

## 2020-05-01 DIAGNOSIS — I959 Hypotension, unspecified: Secondary | ICD-10-CM | POA: Diagnosis present

## 2020-05-01 DIAGNOSIS — Z800 Family history of malignant neoplasm of digestive organs: Secondary | ICD-10-CM | POA: Diagnosis not present

## 2020-05-01 DIAGNOSIS — I2510 Atherosclerotic heart disease of native coronary artery without angina pectoris: Secondary | ICD-10-CM | POA: Diagnosis present

## 2020-05-01 DIAGNOSIS — Z7984 Long term (current) use of oral hypoglycemic drugs: Secondary | ICD-10-CM | POA: Diagnosis not present

## 2020-05-01 DIAGNOSIS — K449 Diaphragmatic hernia without obstruction or gangrene: Secondary | ICD-10-CM | POA: Diagnosis present

## 2020-05-01 DIAGNOSIS — I130 Hypertensive heart and chronic kidney disease with heart failure and stage 1 through stage 4 chronic kidney disease, or unspecified chronic kidney disease: Secondary | ICD-10-CM | POA: Diagnosis present

## 2020-05-01 DIAGNOSIS — Z20822 Contact with and (suspected) exposure to covid-19: Secondary | ICD-10-CM | POA: Diagnosis present

## 2020-05-01 DIAGNOSIS — K219 Gastro-esophageal reflux disease without esophagitis: Secondary | ICD-10-CM | POA: Diagnosis present

## 2020-05-01 DIAGNOSIS — N179 Acute kidney failure, unspecified: Secondary | ICD-10-CM | POA: Diagnosis present

## 2020-05-01 DIAGNOSIS — K922 Gastrointestinal hemorrhage, unspecified: Secondary | ICD-10-CM | POA: Diagnosis not present

## 2020-05-01 DIAGNOSIS — I5042 Chronic combined systolic (congestive) and diastolic (congestive) heart failure: Secondary | ICD-10-CM | POA: Diagnosis present

## 2020-05-01 DIAGNOSIS — D62 Acute posthemorrhagic anemia: Secondary | ICD-10-CM | POA: Diagnosis present

## 2020-05-01 DIAGNOSIS — N1832 Chronic kidney disease, stage 3b: Secondary | ICD-10-CM | POA: Diagnosis present

## 2020-05-01 LAB — CBC
HCT: 24.7 % — ABNORMAL LOW (ref 39.0–52.0)
Hemoglobin: 7.9 g/dL — ABNORMAL LOW (ref 13.0–17.0)
MCH: 30.6 pg (ref 26.0–34.0)
MCHC: 32 g/dL (ref 30.0–36.0)
MCV: 95.7 fL (ref 80.0–100.0)
Platelets: 143 10*3/uL — ABNORMAL LOW (ref 150–400)
RBC: 2.58 MIL/uL — ABNORMAL LOW (ref 4.22–5.81)
RDW: 18.7 % — ABNORMAL HIGH (ref 11.5–15.5)
WBC: 8.3 10*3/uL (ref 4.0–10.5)
nRBC: 0.2 % (ref 0.0–0.2)

## 2020-05-01 LAB — PROTIME-INR
INR: 1.6 — ABNORMAL HIGH (ref 0.8–1.2)
Prothrombin Time: 18.6 seconds — ABNORMAL HIGH (ref 11.4–15.2)

## 2020-05-01 LAB — HEMOGLOBIN AND HEMATOCRIT, BLOOD
HCT: 23 % — ABNORMAL LOW (ref 39.0–52.0)
HCT: 28.8 % — ABNORMAL LOW (ref 39.0–52.0)
Hemoglobin: 7.5 g/dL — ABNORMAL LOW (ref 13.0–17.0)
Hemoglobin: 9.2 g/dL — ABNORMAL LOW (ref 13.0–17.0)

## 2020-05-01 LAB — BASIC METABOLIC PANEL
Anion gap: 9 (ref 5–15)
BUN: 24 mg/dL — ABNORMAL HIGH (ref 8–23)
CO2: 22 mmol/L (ref 22–32)
Calcium: 8.5 mg/dL — ABNORMAL LOW (ref 8.9–10.3)
Chloride: 105 mmol/L (ref 98–111)
Creatinine, Ser: 1.45 mg/dL — ABNORMAL HIGH (ref 0.61–1.24)
GFR, Estimated: 50 mL/min — ABNORMAL LOW (ref >60)
Glucose, Bld: 108 mg/dL — ABNORMAL HIGH (ref 70–99)
Potassium: 3.8 mmol/L (ref 3.5–5.1)
Sodium: 136 mmol/L (ref 135–145)

## 2020-05-01 LAB — GLUCOSE, CAPILLARY
Glucose-Capillary: 134 mg/dL — ABNORMAL HIGH (ref 70–99)
Glucose-Capillary: 136 mg/dL — ABNORMAL HIGH (ref 70–99)
Glucose-Capillary: 164 mg/dL — ABNORMAL HIGH (ref 70–99)
Glucose-Capillary: 73 mg/dL (ref 70–99)
Glucose-Capillary: 91 mg/dL (ref 70–99)
Glucose-Capillary: 99 mg/dL (ref 70–99)

## 2020-05-01 LAB — PREPARE RBC (CROSSMATCH)

## 2020-05-01 MED ORDER — SODIUM CHLORIDE 0.9 % IV SOLN: INTRAVENOUS | Status: DC

## 2020-05-01 MED ORDER — CARVEDILOL 3.125 MG PO TABS
3.1250 mg | ORAL_TABLET | Freq: Every day | ORAL | Status: DC
  Administered 2020-05-02: 3.125 mg via ORAL
  Filled 2020-05-01: qty 1

## 2020-05-01 MED ORDER — POLYETHYLENE GLYCOL 3350 17 GM/SCOOP PO POWD
0.5000 | Freq: Once | ORAL | Status: AC
  Administered 2020-05-02: 127.5 g via ORAL
  Filled 2020-05-01: qty 255

## 2020-05-01 MED ORDER — PEG 3350-KCL-NABCB-NACL-NASULF 236 G PO SOLR
4000.0000 mL | Freq: Once | ORAL | Status: DC
  Filled 2020-05-01: qty 4000

## 2020-05-01 MED ORDER — POLYETHYLENE GLYCOL 3350 17 GM/SCOOP PO POWD
0.5000 | Freq: Once | ORAL | Status: AC
  Administered 2020-05-01: 127.5 g via ORAL
  Filled 2020-05-01: qty 255

## 2020-05-01 MED ORDER — POLYETHYLENE GLYCOL 3350 17 GM/SCOOP PO POWD
0.5000 | Freq: Once | ORAL | Status: DC
  Filled 2020-05-01: qty 255

## 2020-05-01 MED ORDER — PEG 3350-KCL-NABCB-NACL-NASULF 236 G PO SOLR
2000.0000 mL | Freq: Once | ORAL | Status: DC
  Filled 2020-05-01: qty 2000

## 2020-05-01 MED ORDER — LINACLOTIDE 145 MCG PO CAPS
290.0000 ug | ORAL_CAPSULE | Freq: Once | ORAL | Status: AC
  Administered 2020-05-01: 290 ug via ORAL
  Filled 2020-05-01: qty 2

## 2020-05-01 MED ORDER — BISACODYL 5 MG PO TBEC
20.0000 mg | DELAYED_RELEASE_TABLET | Freq: Once | ORAL | Status: AC
  Administered 2020-05-01: 20 mg via ORAL
  Filled 2020-05-01: qty 4

## 2020-05-01 MED ORDER — SODIUM CHLORIDE 0.9% IV SOLUTION: Freq: Once | INTRAVENOUS | Status: AC

## 2020-05-01 MED ORDER — AMIODARONE HCL 100 MG PO TABS
100.0000 mg | ORAL_TABLET | Freq: Every day | ORAL | Status: DC
Start: 2020-05-02 — End: 2020-05-03
  Administered 2020-05-02: 100 mg via ORAL
  Filled 2020-05-01: qty 1

## 2020-05-01 NOTE — Progress Notes (Addendum)
[facility] Progress Note Covering for Dr. Collene Mares and Dr. Benson Norway  CC:  Anemia   Subjective: He denies having any N/V or abdominal pain. No BM since admission. No rectal bleeding. No complaints at this time.   Objective:  Vital signs in last 24 hours: Temp:  [97.6 F (36.4 C)-99 F (37.2 C)] 98.4 F (36.9 C) (03/06 0355) Pulse Rate:  [60-66] 62 (03/06 0804) Resp:  [11-17] 15 (03/06 0355) BP: (99-151)/(50-66) 111/59 (03/06 0804) SpO2:  [68 %-100 %] 99 % (03/06 0355) Weight:  [77.2 kg-77.5 kg] 77.5 kg (03/06 0355) Last BM Date: 04/25/20 General:   Alert 75 year old male in NAD. Eyes: No scleral icterus.  Heart: RRR, no murmur.  Pulm:  Breath sounds clear throughout.  Abdomen: Protuberant, mildly distended. Nontender. +BS x 4 quads. Midline abdominal scar intact. + BS x 4 quads.  Extremities:  Without edema. Neurologic:  Alert and  oriented x4;  grossly normal neurologically. Psych:  Alert and cooperative. Normal mood and affect.   Lab Results: Recent Labs    04/29/20 1059 04/29/20 2010 04/30/20 0110 05/01/20 0235  WBC 9.9 9.4  --  8.3  HGB 6.8* 6.6* 7.1* 7.9*  HCT 19.8* 21.1* 21.2* 24.7*  PLT 193 198  --  143*   BMET Recent Labs    04/29/20 2010 04/30/20 1503 05/01/20 0235  NA 137 137 136  K 4.1 4.0 3.8  CL 107 109 105  CO2 21* 23 22  GLUCOSE 122* 120* 108*  BUN 41* 29* 24*  CREATININE 2.07* 1.48* 1.45*  CALCIUM 8.5* 8.3* 8.5*   LFT  PT/INR Recent Labs    04/29/20 2240 05/01/20 0235  LABPROT 27.6* 18.6*  INR 2.7* 1.6*      DG Abd 1 View  Result Date: 04/30/2020 CLINICAL DATA:  Initial evaluation for abdominal distension. EXAM: ABDOMEN - 1 VIEW COMPARISON:  Prior study from 02/03/2020. FINDINGS: Surgical clips overlie the mid abdomen. Subcentimeter calcific density overlying the right upper quadrant suggestive of cholelithiasis. Multiple mildly prominent gas-filled loops of predominantly large bowel seen within the abdomen. Gas  overlies the rectal vault. Finding is nonspecific, but could reflect underlying ileus. No significant small bowel dilatation is visible. No significant bowel wall thickening. No visible free air on this limited supine view the abdomen. No other soft tissue mass. Degenerative changes noted within lower lumbar spine. Symmetric sclerosis noted about the SI joints bilaterally. Few additional surgical clips overlie the right hip. IMPRESSION: 1. Multiple mildly prominent gas-filled loops of predominantly large bowel within the abdomen, nonspecific, but could reflect an underlying ileus. No significant small bowel dilatation or ovarian evidence for mechanical obstruction. 2. Probable cholelithiasis. Electronically Signed   By: Jeannine Boga M.D.   On: 04/30/2020 01:49    Assessment / Plan:  Peter Lambert is a 74 y.o. male is a 75 year old male with a past medical history significant for hypertension, NSTEMI s/p DES 2010 and 08/2019, VF arrest 08/2019 s/p ICD, chronic diastolic and systolic CHF, atrial fibrillation on Eliquis, PAD s/p bilateral aortobifemoral bypass 1997, hyperlipidemia, diabetes mellitus type 2, chronic kidney disease stage III and iron deficiency anemia.  54.  75 year old male presented to the ED with recurrent acute on chronic anemia with positive FOBT on anticoagulation for atrial fibrillation. EGD 01/06/2020 showed a single nonbleeding angiectasia in the duodenum and extrinsic compression in the mid esophagus and a subsequent upper GI series was negative for paraesophageal hernia.  A colonoscopy 01/08/2020 was incomplete due to  a poor bowel preparation but showed evidence of diverticulosis throughout the colon and internal hemorrhoids.  Admission Hemoglobin 6.8.  He was transfused 1 unit of packed red blood cells.  Posttransfusion Hg 7.1. Today Hg 7.9. No overt GI bleeding at this time. -Clear liquids today then NPO after midnight  -EGD and colonoscopy with Dr. Collene Mares or [facility] Monday 3/7.  EGD and colonoscopy benefits and risks discussed including risk with sedation, risk of bleeding, perforation and infection discussed with the patient. I also spoke with his son Coralyn Pear over the telephone and discussed EGD/colonoscopy procedures benefits and risks.  -Repeat H/H at 12pm -CBC and BMP in AM -PPI 40 mg po every 24 hours -Further recommendations per Dr. Carlean Purl  2.  Atrial fibrillation, previously on Eliquis then recently switched to Coumadin due to cost then switched back to Eliquis on 3/4.  Last dose of Coumadin was on 3/1 in the AM.  Last dose of Eliquis was 3/4. INR 2.7 -> 1.6 -Repeat INR in am  3.  History of coronary artery disease, s/p DES x 2 in 2010 and 2021, MI, VF arrest s/p ICD placement 08/2019.  Diastolic and systolic CHF.  4.  Peripheral arterial disease, remote aortobifemoral bypass graft surgery  5.  Diabetes mellitus type 2  6. Chronic kidney disease stage III. Cr 1.45.     LOS: 0 days   Noralyn Pick  05/01/2020, 9:52 AM     [facility] Attending   I have taken an interval history, reviewed the chart and examined the patient. I agree with the Advanced Practitioner's note, impression and recommendations.   He needs evaluation with colonoscopy and EGD (possibly).  The risks and benefits as well as alternatives of endoscopic procedure(s) have been discussed and reviewed. All questions answered. The patient agrees to proceed.  Dr. Benson Norway or Collene Mares to assume care in AM and perform procedure  Gatha Mayer, MD, [facility] 05/01/2020 2:40 PM

## 2020-05-01 NOTE — Progress Notes (Signed)
PROGRESS NOTE  Kasaun Barbosa Redmann [id number] DOB: [birth year] DOA: 04/29/2020 PCP: Redmond School, MD  HPI/Recap of past 24 hours: Peter Lambert is a 75 y.o. male with medical history significant of CAD status post PCI/DES to LAD in 05/2008, V. fib arrest/chronic systolic and diastolic CHF (EF 123456) status post ICD implant, atrial flutter on Eliquis, hypertension, hyperlipidemia, PAD, type 2 diabetes, CKD stage IIIB who was sent to the ED on 04/29/2020 from his cardiologist office due to symptomatic anemia. Work-up revealed hemoglobin 6.6K, in addition he had a positive FOBT, INR 6.1 on 04/26/2020. Patient was admitted and seen by GI. He has received 1 unit PRBC on 04/29/2020. Per GI maintain hemoglobin greater than 8. Hemoglobin on 05/01/2020 down to 7.5 from 7.9. Will transfuse another 1 unit PRBC on 05/01/2020. Patient is agreeable.  Plan for EGD and colonoscopy on 05/02/2020. Ongoing bowel prep while on clear liquid diet. N.p.o. after midnight.  05/01/2020: Patient was seen and examined at his bedside this morning. He denies any abdominal pain or nausea. Hemoglobin downtrending, will receive 1 unit PRBC in anticipation for his EGD and colonoscopy tomorrow.  Assessment/Plan: Principal Problem:   GI bleed Active Problems:   Atrial flutter (HCC)   Acute blood loss anemia   CAD (coronary artery disease)   AKI (acute kidney injury) (HCC)   Angiodysplasia of small intestine (HCC)  Symptomatic acute blood loss anemia secondary to suspected GI bleed. Patient has a history of GI bleed, duodenal AVM, internal hemorrhoids and diverticulosis in the entire examined colon. Presented with hemoglobin less than 7, recent positive FOBT, recent elevated INR greater than 6. Requiring 1 unit PRBC transfusion received on 04/29/2020. Drop of hemoglobin this morning 7.5 from 7.9. GI has recommended to maintain hemoglobin greater than 8. Transfuse another unit PRBC on 05/01/2020, patient consented. Continue to monitor  H&H Planned EGD and colonoscopy on 05/02/2020. Ongoing bowel prep while on clear liquid diet. N.p.o. after midnight  History of duodenal AVM, internal hemorrhoids, diverticulosis in the entire examined colon. Management per GI Plan EGD and colonoscopy on 05/02/2020. Ongoing bowel prep.  AKI on CKD 3A suspect prerenal in setting of dehydration, volume loss from suspected GI bleed. Baseline creatinine appears to be 1.42 with GFR of 51 Presented with creatinine of 2.07 with GFR of 33 Avoid nephrotoxic agents, hypotension, or dehydration. Monitor urine output Repeat renal panel in the morning.  Resolved chronic constipation Continue bowel prep per GI for possible colonoscopy on Monday.  Resolved post IV fluid hydration: Mild non-anion gap metabolic acidosis likely secondary to AKI Serum bicarb 22 from 21 and anion gap of 9  Essential hypertension Blood pressure is currently soft BP 99/53, heart rate 59, respiration rate 16, O2 saturation 100% room air. DC Lopressor 25 mg daily. Reduce dose of Coreg down to 3.125 mg daily. Decrease dose of amiodarone 100 mg daily. Continue to closely monitor vital signs.  GERD Continue daily Protonix 40 mg.  Hyperlipidemia Continue Lipitor 80 mg daily.  Physical debility PT assessed and had no further PT recommendations. Continue fall precautions.   Code Status: Full code.  Family Communication: None at bedside.  Disposition Plan: Likely will discharge to home once GI signs off.   Consultants:  GI.  Procedures:  None  Possible colonoscopy on Monday, 05/02/2020.  Antimicrobials:  None.  DVT prophylaxis: SCDs  Status is: Observation    Dispo:  Patient From: Home  Planned Disposition: Home  Anticipated discharge date: 05/02/2020.  Medically stable for discharge: No, ongoing  management of acute blood loss anemia with presumed GI bleed.       Objective: Vitals:   05/01/20 0000 05/01/20 0355 05/01/20 0804 05/01/20 1211   BP: (!) 106/52 (!) 104/54 (!) 111/59 (!) 99/53  Pulse: 61 60 62 (!) 59  Resp: '17 15  16  '$ Temp: 99 F (37.2 C) 98.4 F (36.9 C)  98.5 F (36.9 C)  TempSrc: Oral Oral  Oral  SpO2: 98% 99%  100%  Weight:  77.5 kg    Height:        Intake/Output Summary (Last 24 hours) at 05/01/2020 1237 Last data filed at 05/01/2020 1200 Gross per 24 hour  Intake 1009.01 ml  Output -  Net 1009.01 ml   Filed Weights   04/30/20 1414 05/01/20 0355  Weight: 77.2 kg 77.5 kg    Exam:  . General: 75 y.o. year-old male frail-appearing in no acute distress. Alert and oriented x3.  . Cardiovascular: Bradycardic with no rubs or gallops. No JVD or thyromegaly. Marland Kitchen Respiratory: Clear to auscultation no wheezes or rales. Good inspiratory effort.  . Abdomen: Soft nontender normal bowel sounds present.  . Musculoskeletal: No lower extremity edema bilaterally. . Skin: No ulcerative lesions noted. Marland Kitchen Psychiatry: Mood is appropriate for condition and setting.   Data Reviewed: CBC: Recent Labs  Lab 04/26/20 1649 04/27/20 0450 04/29/20 1059 04/29/20 2010 04/30/20 0110 05/01/20 0235 05/01/20 1147  WBC 9.5  --  9.9 9.4  --  8.3  --   NEUTROABS 6.7  --   --   --   --   --   --   HGB 6.1*   < > 6.8* 6.6* 7.1* 7.9* 7.5*  HCT 19.5*   < > 19.8* 21.1* 21.2* 24.7* 23.0*  MCV 98.0  --  90 98.1  --  95.7  --   PLT 169  --  193 198  --  143*  --    < > = values in this interval not displayed.   Basic Metabolic Panel: Recent Labs  Lab 04/26/20 1649 04/29/20 2010 04/30/20 1503 05/01/20 0235  NA 137 137 137 136  K 5.1 4.1 4.0 3.8  CL 106 107 109 105  CO2 24 21* 23 22  GLUCOSE 114* 122* 120* 108*  BUN 53* 41* 29* 24*  CREATININE 1.76* 2.07* 1.48* 1.45*  CALCIUM 8.5* 8.5* 8.3* 8.5*   GFR: Estimated Creatinine Clearance: 41.9 mL/min (A) (by C-G formula based on SCr of 1.45 mg/dL (H)). Liver Function Tests: Recent Labs  Lab 04/26/20 1649 04/29/20 2010  AST 16 20  ALT 17 18  ALKPHOS 42 57  BILITOT 0.3  0.8  PROT 6.4* 6.1*  ALBUMIN 3.5 3.5   No results for input(s): LIPASE, AMYLASE in the last 168 hours. No results for input(s): AMMONIA in the last 168 hours. Coagulation Profile: Recent Labs  Lab 04/26/20 1649 04/27/20 0450 04/29/20 1045 04/29/20 2240 05/01/20 0235  INR 6.2* 5.3* 2.6 2.7* 1.6*   Cardiac Enzymes: No results for input(s): CKTOTAL, CKMB, CKMBINDEX, TROPONINI in the last 168 hours. BNP (last 3 results) No results for input(s): PROBNP in the last 8760 hours. HbA1C: Recent Labs    04/30/20 0108  HGBA1C 6.1*   CBG: Recent Labs  Lab 04/30/20 2105 05/01/20 0006 05/01/20 0357 05/01/20 0806 05/01/20 1215  GLUCAP 122* 134* 99 136* 164*   Lipid Profile: No results for input(s): CHOL, HDL, LDLCALC, TRIG, CHOLHDL, LDLDIRECT in the last 72 hours. Thyroid Function Tests: No results for input(s): TSH,  T4TOTAL, FREET4, T3FREE, THYROIDAB in the last 72 hours. Anemia Panel: No results for input(s): VITAMINB12, FOLATE, FERRITIN, TIBC, IRON, RETICCTPCT in the last 72 hours. Urine analysis:    Component Value Date/Time   COLORURINE YELLOW 04/30/2020 0109   APPEARANCEUR CLEAR 04/30/2020 0109   LABSPEC 1.015 04/30/2020 0109   PHURINE 5.0 04/30/2020 0109   GLUCOSEU NEGATIVE 04/30/2020 0109   HGBUR NEGATIVE 04/30/2020 0109   BILIRUBINUR NEGATIVE 04/30/2020 0109   KETONESUR NEGATIVE 04/30/2020 0109   PROTEINUR NEGATIVE 04/30/2020 0109   NITRITE NEGATIVE 04/30/2020 0109   LEUKOCYTESUR SMALL (A) 04/30/2020 0109   Sepsis Labs: '@LABRCNTIP'$ (procalcitonin:4,lacticidven:4)  ) Recent Results (from the past 240 hour(s))  SARS CORONAVIRUS 2 (TAT 6-24 HRS) Nasopharyngeal Nasopharyngeal Swab     Status: None   Collection Time: 04/30/20  8:49 AM   Specimen: Nasopharyngeal Swab  Result Value Ref Range Status   SARS Coronavirus 2 NEGATIVE NEGATIVE Final    Comment: (NOTE) SARS-CoV-2 target nucleic acids are NOT DETECTED.  The SARS-CoV-2 RNA is generally detectable in upper  and lower respiratory specimens during the acute phase of infection. Negative results do not preclude SARS-CoV-2 infection, do not rule out co-infections with other pathogens, and should not be used as the sole basis for treatment or other patient management decisions. Negative results must be combined with clinical observations, patient history, and epidemiological information. The expected result is Negative.  Fact Sheet for Patients: SugarRoll.be  Fact Sheet for Healthcare Providers: https://www.woods-mathews.com/  This test is not yet approved or cleared by the [place] FDA and  has been authorized for detection and/or diagnosis of SARS-CoV-2 by FDA under an Emergency Use Authorization (EUA). This EUA will remain  in effect (meaning this test can be used) for the duration of the COVID-19 declaration under Se ction 564(b)(1) of the Act, 21 U.S.C. section 360bbb-3(b)(1), unless the authorization is terminated or revoked sooner.  Performed at [facility], [place] [address], [place], [place] [address]       Studies: No results found.  Scheduled Meds: . sodium chloride   Intravenous Once  . [START ON 05/02/2020] amiodarone  100 mg Oral Daily  . atorvastatin  80 mg Oral Daily  . [START ON 05/02/2020] carvedilol  3.125 mg Oral Q breakfast  . insulin aspart  0-9 Units Subcutaneous Q4H  . pantoprazole  40 mg Oral Daily    Continuous Infusions: . sodium chloride Stopped (04/30/20 1453)     LOS: 0 days     Kayleen Memos, MD [facility] Pager [phone]  If 7PM-7AM, please contact night-coverage [email or web address] Password [facility] 05/01/2020, 12:37 PM

## 2020-05-01 NOTE — Care Management Obs Status (Signed)
[facility] [place] NOTIFICATION   Patient Details  Name: Peter Lambert MRN: [id number] Date of Birth: Feb 20, 1946   Medicare Observation Status Notification Given:  Yes    Carles Collet, RN 05/01/2020, 9:36 AM

## 2020-05-01 NOTE — Progress Notes (Signed)
OT Cancellation Note  Patient Details Name: [name] MRN: [id number] DOB: 26-Sep-1945   Cancelled Treatment:    Reason Eval/Treat Not Completed: OT screened, no needs identified, will sign off. Per chart review and conversation with pt, he appeared to be close to his baseline with ADLs. Supervision to Independent with OOB transfers and mobility with PT yesterday. Did discuss basic energy conservation strategies for managing ADLs at home until he feels fully recovered (planning, prioritizing, pacing, etc).   Tyrone Schimke, OT Acute Rehabilitation Services Pager: [phone] Office: [phone]  05/01/2020, 12:40 PM

## 2020-05-02 ENCOUNTER — Telehealth (INDEPENDENT_AMBULATORY_CARE_PROVIDER_SITE_OTHER): Payer: Self-pay | Admitting: Gastroenterology

## 2020-05-02 LAB — GLUCOSE, CAPILLARY
Glucose-Capillary: 105 mg/dL — ABNORMAL HIGH (ref 70–99)
Glucose-Capillary: 75 mg/dL (ref 70–99)
Glucose-Capillary: 82 mg/dL (ref 70–99)
Glucose-Capillary: 84 mg/dL (ref 70–99)
Glucose-Capillary: 92 mg/dL (ref 70–99)
Glucose-Capillary: 94 mg/dL (ref 70–99)
Glucose-Capillary: 94 mg/dL (ref 70–99)

## 2020-05-02 LAB — PHOSPHORUS: Phosphorus: 3.5 mg/dL (ref 2.5–4.6)

## 2020-05-02 LAB — BPAM RBC
Blood Product Expiration Date: 202203282359
Blood Product Expiration Date: 202204032359
ISSUE DATE / TIME: 202203042300
ISSUE DATE / TIME: 202203061430
Unit Type and Rh: 7300
Unit Type and Rh: 7300

## 2020-05-02 LAB — BASIC METABOLIC PANEL
Anion gap: 8 (ref 5–15)
BUN: 15 mg/dL (ref 8–23)
CO2: 22 mmol/L (ref 22–32)
Calcium: 8.6 mg/dL — ABNORMAL LOW (ref 8.9–10.3)
Chloride: 104 mmol/L (ref 98–111)
Creatinine, Ser: 1.38 mg/dL — ABNORMAL HIGH (ref 0.61–1.24)
GFR, Estimated: 53 mL/min — ABNORMAL LOW (ref >60)
Glucose, Bld: 99 mg/dL (ref 70–99)
Potassium: 4.1 mmol/L (ref 3.5–5.1)
Sodium: 134 mmol/L — ABNORMAL LOW (ref 135–145)

## 2020-05-02 LAB — CBC
HCT: 28.5 % — ABNORMAL LOW (ref 39.0–52.0)
Hemoglobin: 9.3 g/dL — ABNORMAL LOW (ref 13.0–17.0)
MCH: 29.4 pg (ref 26.0–34.0)
MCHC: 32.6 g/dL (ref 30.0–36.0)
MCV: 90.2 fL (ref 80.0–100.0)
Platelets: 126 10*3/uL — ABNORMAL LOW (ref 150–400)
RBC: 3.16 MIL/uL — ABNORMAL LOW (ref 4.22–5.81)
RDW: 19 % — ABNORMAL HIGH (ref 11.5–15.5)
WBC: 8.3 10*3/uL (ref 4.0–10.5)
nRBC: 0.2 % (ref 0.0–0.2)

## 2020-05-02 LAB — TYPE AND SCREEN
ABO/RH(D): B POS
Antibody Screen: NEGATIVE
Unit division: 0
Unit division: 0

## 2020-05-02 LAB — MAGNESIUM: Magnesium: 1.9 mg/dL (ref 1.7–2.4)

## 2020-05-02 MED ORDER — PEG 3350-KCL-NA BICARB-NACL 420 G PO SOLR
4000.0000 mL | Freq: Once | ORAL | Status: AC
  Administered 2020-05-02: 4000 mL via ORAL
  Filled 2020-05-02: qty 4000

## 2020-05-02 NOTE — Progress Notes (Signed)
Tap water enema administered in preparation for colonoscopy.

## 2020-05-02 NOTE — Progress Notes (Signed)
Physical Therapy Treatment Patient Details Name: Peter Lambert MRN: [id number] DOB: [birth year] Today's Date: 05/02/2020    History of Present Illness The pt is a 75 yo male presenting 3/4 with HGB of 6.8 and is s/p 1 unit PRBC. Pt recently presented 3/1 with similar sx, but left AMA 3/2 instead of being admitted. PMH includes: HTN, NSTEMI, VF arrest s/p ICD, CHF, afib, PAD, HLD, DM II, CKD III, and anemia.    PT Comments    Continuing work on functional mobility and activity tolerance;  Able to incr amb distance, and seemed pleased to walk more; pt reports recent onset of R knee ache/stiffness -- he attributes this to possible arthritis; initailly with gait asymentries (attributable to knee stiffness and aching) which evened out with more distance walked;   For colonoscopy later today -- I anticipate dc tomorrow, 3/8  Follow Up Recommendations  Outpatient PT;Other (comment) (worth considering Outpt PT for his recent arthritis pain)     Equipment Recommendations  None recommended by PT    Recommendations for Other Services       Precautions / Restrictions Precautions Precautions: None Restrictions Weight Bearing Restrictions: No    Mobility  Bed Mobility Overal bed mobility: Independent                  Transfers Overall transfer level: Modified independent Equipment used: None             General transfer comment: Slow rise with noted pt pushing up on R thigh as he stood; He attributes stiffness to onset of arthritis  Ambulation/Gait Ambulation/Gait assistance: Supervision Gait Distance (Feet): 120 Feet Assistive device: None Gait Pattern/deviations: Step-through pattern Gait velocity: initially slightly slower   General Gait Details: Noting slight gait assymetries including R knee stiffness; slightly longer stance L, and tendency for slight incr L lean during R swing; Overall stiff initially, and gait pattern smoothed out a bit with incr amb -- pt  attributes stiffness/aching to onset of arthritis   Stairs             Wheelchair Mobility    Modified Rankin (Stroke Patients Only)       Balance Overall balance assessment: Mild deficits observed, not formally tested                                          Cognition Arousal/Alertness: Awake/alert Behavior During Therapy: WFL for tasks assessed/performed Overall Cognitive Status: Within Functional Limits for tasks assessed                                        Exercises      General Comments General comments (skin integrity, edema, etc.): We discussed follow up post this admission, including getting his knees looked at      Pertinent Vitals/Pain Pain Assessment: Faces Faces Pain Scale: Hurts a little bit Pain Location: R knee stiffness Pain Descriptors / Indicators: Aching Pain Intervention(s): Monitored during session;Other (comment) (performed some ROM to warm up)    Home Living                      Prior Function            PT Goals (current goals can now be found in the care plan section)  Acute Rehab PT Goals Patient Stated Goal: return home PT Goal Formulation: With patient Time For Goal Achievement: 05/14/20 Potential to Achieve Goals: Good Progress towards PT goals: Progressing toward goals    Frequency    Min 3X/week      PT Plan Discharge plan needs to be updated    Co-evaluation              AM-PAC PT "6 Clicks" Mobility   Outcome Measure  Help needed turning from your back to your side while in a flat bed without using bedrails?: None Help needed moving from lying on your back to sitting on the side of a flat bed without using bedrails?: None Help needed moving to and from a bed to a chair (including a wheelchair)?: None Help needed standing up from a chair using your arms (e.g., wheelchair or bedside chair)?: None Help needed to walk in hospital room?: A Little Help needed  climbing 3-5 steps with a railing? : A Little 6 Click Score: 22    End of Session Equipment Utilized During Treatment: Gait belt Activity Tolerance: Patient tolerated treatment well Patient left: in bed;with call bell/phone within reach;with nursing/sitter in room (about to have an enema) Nurse Communication: Mobility status PT Visit Diagnosis: Other abnormalities of gait and mobility (R26.89)     Time: [id number] PT Time Calculation (min) (ACUTE ONLY): 10 min  Charges:  $Gait Training: 8-22 mins                     Roney Marion, PT  Acute Rehabilitation Services Pager [phone] Office [phone]    [name] 05/02/2020, 11:58 AM

## 2020-05-02 NOTE — H&P (View-Only) (Signed)
UNASSIGNED PATIENT Subjective: Peter Lambert is a 75 year old black male with a history of coronary artery disease status post PCI/DES to the LAD in April 2010 with a V. fib arrest/chronic systolic and diastolic congestive heart failure with EF 20% status post ICD implant atrial flutter on Eliquis, hypertension hyperlipidemia type 2 diabetes and chronic stage III kidney disease who came to the ER from his cardiologist office for symptomatic anemia.  On admission he was found to have a hemoglobin of 6.6 g/dL and was transfused 1 unit of blood on 04/29/20 and another unit of PRBC's yesterday. He was supposed to have a EGD and colonoscopy today but he was not completely prepped and therefore has been reprep for another attempt on his procedures tomorrow.  He denies having any abdominal pain nausea vomiting melena hematochezia.  He feels his stools are" clearing up now" and there is no blood or mucus in the stool.  On admission his INR was 6.1.  On reviewing his old records he had a colonoscopy done on 01/08/2020 by Dr. Hurshel Keys when the prep was noted to be extremely poor and no source of bleeding could be identified; pandiverticulosis was noted on the colonoscopy along with internal hemorrhoids. Additionally had an EGD done on 01/06/2020 when a small nonbleeding AVM was noted in the duodenum but no other source of blood loss was identified  Objective: Vital signs in last 24 hours: Temp:  [98.4 F (36.9 C)-98.8 F (37.1 C)] 98.8 F (37.1 C) (03/07 1219) Pulse Rate:  [67-73] 73 (03/07 1219) Resp:  [15-16] 16 (03/07 1219) BP: (113-156)/(55-85) 113/85 (03/07 1219) SpO2:  [100 %] 100 % (03/07 1219) Weight:  [77.3 kg] 77.3 kg (03/07 0411) Last BM Date: 05/02/20  Intake/Output from previous day: 03/06 0701 - 03/07 0700 In: 555 [P.O.:240; Blood:315] Out: -  Intake/Output this shift: No intake/output data recorded.  General appearance: alert, cooperative, appears stated age and no  distress Resp: clear to auscultation bilaterally Cardio: regular rate and rhythm, S1, S2 normal, no murmur, click, rub or gallop GI: soft, non-tender; bowel sounds normal; no masses,  no organomegaly Extremities: extremities normal, atraumatic, no cyanosis or edema  Lab Results: Recent Labs    04/29/20 2010 04/30/20 0110 05/01/20 0235 05/01/20 1147 05/01/20 1946 05/02/20 0351  WBC 9.4  --  8.3  --   --  8.3  HGB 6.6*   < > 7.9* 7.5* 9.2* 9.3*  HCT 21.1*   < > 24.7* 23.0* 28.8* 28.5*  PLT 198  --  143*  --   --  126*   < > = values in this interval not displayed.   BMET Recent Labs    04/30/20 1503 05/01/20 0235 05/02/20 0351  NA 137 136 134*  K 4.0 3.8 4.1  CL 109 105 104  CO2 '23 22 22  '$ GLUCOSE 120* 108* 99  BUN 29* 24* 15  CREATININE 1.48* 1.45* 1.38*  CALCIUM 8.3* 8.5* 8.6*   LFT Recent Labs    04/29/20 2010  PROT 6.1*  ALBUMIN 3.5  AST 20  ALT 18  ALKPHOS 57  BILITOT 0.8   PT/INR Recent Labs    04/29/20 2240 05/01/20 0235  LABPROT 27.6* 18.6*  INR 2.7* 1.6*   Medications: I have reviewed the patient's current medications.  Assessment/Plan: 1} Guaiac positive stools with anemia secondary to blood loss; the patient is being reprep today for an EGD and colonoscopy tomorrow. 2) Pandiverticulosis internal hemorrhoids noted on colonoscopy done in November 2021. 3) GERD.  4) History of a duodenal AVM in the rectum noted on an EGD.  on 01/06/2020. 5) AKI on chronic kidney disease. 6) Atrial flutter/CAD/chronic systolic and diastolic congestive heart failure with EF of 20% and ICD implant.  LOS: 1 day   Juanita Craver 05/02/2020, 4:59 PM

## 2020-05-02 NOTE — Telephone Encounter (Signed)
Per Mitzie patient called back and reported he was a inpatient at [facility].

## 2020-05-02 NOTE — Telephone Encounter (Signed)
I made Dr. Jenetta Downer aware the patient was in patient.

## 2020-05-02 NOTE — Progress Notes (Signed)
PROGRESS NOTE  Peter Lambert [id number] DOB: 1946-01-04 DOA: 04/29/2020 PCP: Redmond School, MD  HPI/Recap of past 24 hours: Peter Lambert is a 75 y.o. male with medical history significant of CAD status post PCI/DES to LAD in 05/2008, V. fib arrest/chronic systolic and diastolic CHF (EF 123456) status post ICD implant, atrial flutter on Eliquis, hypertension, hyperlipidemia, PAD, type 2 diabetes, CKD stage IIIB who was sent to the ED on 04/29/2020 from his cardiologist office due to symptomatic anemia. Work-up revealed hemoglobin 6.6K, in addition he had a positive FOBT, INR 6.1 on 04/26/2020. Patient was admitted and seen by GI. He has received 1 unit PRBC on 04/29/2020. Per GI maintain hemoglobin greater than 8. Hemoglobin on 05/01/2020 down to 7.5 from 7.9. Will transfuse another 1 unit PRBC on 05/01/2020. Patient is agreeable.  Plan for EGD and colonoscopy on 05/02/2020. Ongoing bowel prep.   05/02/2020: Patient was seen and examined at his bedside.  He denies having any new complaints.  Plan: EGD colonoscopy.  Assessment/Plan: Principal Problem:   GI bleed Active Problems:   Atrial flutter (HCC)   Acute blood loss anemia   CAD (coronary artery disease)   AKI (acute kidney injury) (HCC)   Angiodysplasia of small intestine (HCC)  Symptomatic acute blood loss anemia secondary to suspected GI bleed. Patient has a history of GI bleed, duodenal AVM, internal hemorrhoids and diverticulosis in the entire examined colon. Presented with hemoglobin less than 7, recent positive FOBT, recent elevated INR greater than 6. Received 1 unit PRBC on 04/29/2020. Transfused another unit of PRBC on 05/01/2020. Hemoglobin currently stable at 9.3 post transfusions. Endoscopy planned today.  History of duodenal AVM, internal hemorrhoids, diverticulosis in the entire examined colon. Management per GI Planned EGD and colonoscopy on 05/02/2020. Ongoing bowel prep.  Improving AKI on CKD 3A suspect prerenal in  setting of dehydration, volume loss from suspected GI bleed. Baseline creatinine appears to be 1.42 with GFR of 51 Presented with creatinine of 2.07 with GFR of 33 Creatinine is downtrending 1.38 from 1.45. Avoid nephrotoxic agents, hypotension, or dehydration. No urine output has been recorded.  Monitor urine output. Repeat renal panel in the morning.  Resolved chronic constipation Continue bowel prep per GI for possible colonoscopy on Monday.  Resolved post IV fluid hydration: Mild non-anion gap metabolic acidosis likely secondary to AKI Serum bicarb 22 from 21 and anion gap of 9  Essential hypertension Blood pressure is currently at goal [facility] Lopressor 25 mg daily on 05/01/2020. Reduced dose of Coreg down to 3.125 mg daily on 05/01/2020 Decrease dose of amiodarone 100 mg daily on 05/01/2020. Continue to closely monitor vital signs.  GERD Continue daily Protonix 40 mg.  Hyperlipidemia Continue Lipitor 80 mg daily.  Physical debility PT assessed and had no further PT recommendations. Continue fall precautions.   Code Status: Full code.  Family Communication: None at bedside.  Disposition Plan: Likely will discharge to home once GI signs off.   Consultants:  GI.  Procedures:  None  Possible colonoscopy on Monday, 05/02/2020.  Antimicrobials:  None.  DVT prophylaxis: SCDs  Status is: Observation    Dispo:  Patient From: Home  Planned Disposition: Home  Anticipated discharge date: 05/03/2020.  Medically stable for discharge: No, ongoing management of acute blood loss anemia with presumed GI bleed.       Objective: Vitals:   05/02/20 0016 05/02/20 0411 05/02/20 0812 05/02/20 1219  BP: (!) 156/70 (!) 156/78 (!) 148/75 113/85  Pulse: 71 73 67 73  Resp: '16 16  16  '$ Temp: 98.4 F (36.9 C) 98.7 F (37.1 C)  98.8 F (37.1 C)  TempSrc: Oral Oral  Oral  SpO2: 100% 100%  100%  Weight:  77.3 kg    Height:        Intake/Output Summary (Last 24 hours) at  05/02/2020 1630 Last data filed at 05/01/2020 1745 Gross per 24 hour  Intake 315 ml  Output --  Net 315 ml   Filed Weights   04/30/20 1414 05/01/20 0355 05/02/20 0411  Weight: 77.2 kg 77.5 kg 77.3 kg    Exam:  . General: 75 y.o. year-old male elderly gentleman in no acute distress.  Alert and oriented x3.   . Cardiovascular: Regular rate and rhythm no rubs or gallops. Marland Kitchen Respiratory: Clear to auscultation no wheezes or rales. . Abdomen: Soft nontender normal bowel sounds present.   . Musculoskeletal: No lower extremity edema bilaterally.   . Skin: No ulcerative lesions noted.   Marland Kitchen Psychiatry: Mood is appropriate for condition and setting.  Data Reviewed: CBC: Recent Labs  Lab 04/26/20 1649 04/27/20 0450 04/29/20 1059 04/29/20 2010 04/30/20 0110 05/01/20 0235 05/01/20 1147 05/01/20 1946 05/02/20 0351  WBC 9.5  --  9.9 9.4  --  8.3  --   --  8.3  NEUTROABS 6.7  --   --   --   --   --   --   --   --   HGB 6.1*   < > 6.8* 6.6* 7.1* 7.9* 7.5* 9.2* 9.3*  HCT 19.5*   < > 19.8* 21.1* 21.2* 24.7* 23.0* 28.8* 28.5*  MCV 98.0  --  90 98.1  --  95.7  --   --  90.2  PLT 169  --  193 198  --  143*  --   --  126*   < > = values in this interval not displayed.   Basic Metabolic Panel: Recent Labs  Lab 04/26/20 1649 04/29/20 2010 04/30/20 1503 05/01/20 0235 05/02/20 0351  NA 137 137 137 136 134*  K 5.1 4.1 4.0 3.8 4.1  CL 106 107 109 105 104  CO2 24 21* '23 22 22  '$ GLUCOSE 114* 122* 120* 108* 99  BUN 53* 41* 29* 24* 15  CREATININE 1.76* 2.07* 1.48* 1.45* 1.38*  CALCIUM 8.5* 8.5* 8.3* 8.5* 8.6*  MG  --   --   --   --  1.9  PHOS  --   --   --   --  3.5   GFR: Estimated Creatinine Clearance: 44.1 mL/min (A) (by C-G formula based on SCr of 1.38 mg/dL (H)). Liver Function Tests: Recent Labs  Lab 04/26/20 1649 04/29/20 2010  AST 16 20  ALT 17 18  ALKPHOS 42 57  BILITOT 0.3 0.8  PROT 6.4* 6.1*  ALBUMIN 3.5 3.5   No results for input(s): LIPASE, AMYLASE in the last 168  hours. No results for input(s): AMMONIA in the last 168 hours. Coagulation Profile: Recent Labs  Lab 04/26/20 1649 04/27/20 0450 04/29/20 1045 04/29/20 2240 05/01/20 0235  INR 6.2* 5.3* 2.6 2.7* 1.6*   Cardiac Enzymes: No results for input(s): CKTOTAL, CKMB, CKMBINDEX, TROPONINI in the last 168 hours. BNP (last 3 results) No results for input(s): PROBNP in the last 8760 hours. HbA1C: Recent Labs    04/30/20 0108  HGBA1C 6.1*   CBG: Recent Labs  Lab 05/01/20 2008 05/02/20 0015 05/02/20 0414 05/02/20 1113 05/02/20 1604  GLUCAP 91 105* 92 94 84  Lipid Profile: No results for input(s): CHOL, HDL, LDLCALC, TRIG, CHOLHDL, LDLDIRECT in the last 72 hours. Thyroid Function Tests: No results for input(s): TSH, T4TOTAL, FREET4, T3FREE, THYROIDAB in the last 72 hours. Anemia Panel: No results for input(s): VITAMINB12, FOLATE, FERRITIN, TIBC, IRON, RETICCTPCT in the last 72 hours. Urine analysis:    Component Value Date/Time   COLORURINE YELLOW 04/30/2020 0109   APPEARANCEUR CLEAR 04/30/2020 0109   LABSPEC 1.015 04/30/2020 0109   PHURINE 5.0 04/30/2020 0109   GLUCOSEU NEGATIVE 04/30/2020 0109   HGBUR NEGATIVE 04/30/2020 0109   BILIRUBINUR NEGATIVE 04/30/2020 0109   KETONESUR NEGATIVE 04/30/2020 0109   PROTEINUR NEGATIVE 04/30/2020 0109   NITRITE NEGATIVE 04/30/2020 0109   LEUKOCYTESUR SMALL (A) 04/30/2020 0109   Sepsis Labs: '@LABRCNTIP'$ (procalcitonin:4,lacticidven:4)  ) Recent Results (from the past 240 hour(s))  SARS CORONAVIRUS 2 (TAT 6-24 HRS) Nasopharyngeal Nasopharyngeal Swab     Status: None   Collection Time: 04/30/20  8:49 AM   Specimen: Nasopharyngeal Swab  Result Value Ref Range Status   SARS Coronavirus 2 NEGATIVE NEGATIVE Final    Comment: (NOTE) SARS-CoV-2 target nucleic acids are NOT DETECTED.  The SARS-CoV-2 RNA is generally detectable in upper and lower respiratory specimens during the acute phase of infection. Negative results do not preclude  SARS-CoV-2 infection, do not rule out co-infections with other pathogens, and should not be used as the sole basis for treatment or other patient management decisions. Negative results must be combined with clinical observations, patient history, and epidemiological information. The expected result is Negative.  Fact Sheet for Patients: SugarRoll.be  Fact Sheet for Healthcare Providers: https://www.woods-mathews.com/  This test is not yet approved or cleared by the [place] FDA and  has been authorized for detection and/or diagnosis of SARS-CoV-2 by FDA under an Emergency Use Authorization (EUA). This EUA will remain  in effect (meaning this test can be used) for the duration of the COVID-19 declaration under Se ction 564(b)(1) of the Act, 21 U.S.C. section 360bbb-3(b)(1), unless the authorization is terminated or revoked sooner.  Performed at [facility], [place] [address] [place], [place] [address]       Studies: No results found.  Scheduled Meds: . amiodarone  100 mg Oral Daily  . atorvastatin  80 mg Oral Daily  . carvedilol  3.125 mg Oral Q breakfast  . insulin aspart  0-9 Units Subcutaneous Q4H  . pantoprazole  40 mg Oral Daily    Continuous Infusions: . sodium chloride Stopped (04/30/20 1453)  . sodium chloride       LOS: 1 day     Kayleen Memos, MD [facility] Pager [phone]  If 7PM-7AM, please contact night-coverage [email or web address] Password [facility] 05/02/2020, 4:30 PM

## 2020-05-02 NOTE — Progress Notes (Signed)
UNASSIGNED PATIENT Subjective: Peter Lambert is a 75 year old black male with a history of coronary artery disease status post PCI/DES to the LAD in April 2010 with a V. fib arrest/chronic systolic and diastolic congestive heart failure with EF 20% status post ICD implant atrial flutter on Eliquis, hypertension hyperlipidemia type 2 diabetes and chronic stage III kidney disease who came to the ER from his cardiologist office for symptomatic anemia.  On admission he was found to have a hemoglobin of 6.6 g/dL and was transfused 1 unit of blood on 04/29/20 and another unit of PRBC's yesterday. He was supposed to have a EGD and colonoscopy today but he was not completely prepped and therefore has been reprep for another attempt on his procedures tomorrow.  He denies having any abdominal pain nausea vomiting melena hematochezia.  He feels his stools are" clearing up now" and there is no blood or mucus in the stool.  On admission his INR was 6.1.  On reviewing his old records he had a colonoscopy done on 01/08/2020 by Dr. Hurshel Keys when the prep was noted to be extremely poor and no source of bleeding could be identified; pandiverticulosis was noted on the colonoscopy along with internal hemorrhoids. Additionally had an EGD done on 01/06/2020 when a small nonbleeding AVM was noted in the duodenum but no other source of blood loss was identified  Objective: Vital signs in last 24 hours: Temp:  [98.4 F (36.9 C)-98.8 F (37.1 C)] 98.8 F (37.1 C) (03/07 1219) Pulse Rate:  [67-73] 73 (03/07 1219) Resp:  [15-16] 16 (03/07 1219) BP: (113-156)/(55-85) 113/85 (03/07 1219) SpO2:  [100 %] 100 % (03/07 1219) Weight:  [77.3 kg] 77.3 kg (03/07 0411) Last BM Date: 05/02/20  Intake/Output from previous day: 03/06 0701 - 03/07 0700 In: 555 [P.O.:240; Blood:315] Out: -  Intake/Output this shift: No intake/output data recorded.  General appearance: alert, cooperative, appears stated age and no  distress Resp: clear to auscultation bilaterally Cardio: regular rate and rhythm, S1, S2 normal, no murmur, click, rub or gallop GI: soft, non-tender; bowel sounds normal; no masses,  no organomegaly Extremities: extremities normal, atraumatic, no cyanosis or edema  Lab Results: Recent Labs    04/29/20 2010 04/30/20 0110 05/01/20 0235 05/01/20 1147 05/01/20 1946 05/02/20 0351  WBC 9.4  --  8.3  --   --  8.3  HGB 6.6*   < > 7.9* 7.5* 9.2* 9.3*  HCT 21.1*   < > 24.7* 23.0* 28.8* 28.5*  PLT 198  --  143*  --   --  126*   < > = values in this interval not displayed.   BMET Recent Labs    04/30/20 1503 05/01/20 0235 05/02/20 0351  NA 137 136 134*  K 4.0 3.8 4.1  CL 109 105 104  CO2 '23 22 22  '$ GLUCOSE 120* 108* 99  BUN 29* 24* 15  CREATININE 1.48* 1.45* 1.38*  CALCIUM 8.3* 8.5* 8.6*   LFT Recent Labs    04/29/20 2010  PROT 6.1*  ALBUMIN 3.5  AST 20  ALT 18  ALKPHOS 57  BILITOT 0.8   PT/INR Recent Labs    04/29/20 2240 05/01/20 0235  LABPROT 27.6* 18.6*  INR 2.7* 1.6*   Medications: I have reviewed the patient's current medications.  Assessment/Plan: 1} Guaiac positive stools with anemia secondary to blood loss; the patient is being reprep today for an EGD and colonoscopy tomorrow. 2) Pandiverticulosis internal hemorrhoids noted on colonoscopy done in November 2021. 3) GERD.  4) History of a duodenal AVM in the rectum noted on an EGD.  on 01/06/2020. 5) AKI on chronic kidney disease. 6) Atrial flutter/CAD/chronic systolic and diastolic congestive heart failure with EF of 20% and ICD implant.  LOS: 1 day   Peter Lambert 05/02/2020, 4:59 PM

## 2020-05-02 NOTE — Anesthesia Preprocedure Evaluation (Addendum)
Anesthesia Evaluation  Patient identified by MRN, date of birth, ID band Patient awake    Reviewed: Allergy & Precautions, Patient's Chart, lab work & pertinent test results, reviewed documented beta blocker date and time   History of Anesthesia Complications Negative for: history of anesthetic complications  Airway Mallampati: II  TM Distance: >3 FB Neck ROM: Full    Dental  (+) Edentulous Upper, Edentulous Lower   Pulmonary COPD, former smoker,    Pulmonary exam normal        Cardiovascular hypertension, Pt. on home beta blockers and Pt. on medications + CAD, + Past MI, + Cardiac Stents (2010), + Peripheral Vascular Disease and +CHF  + dysrhythmias Atrial Fibrillation + Cardiac Defibrillator  Rhythm:Regular Rate:Normal  TTE 08/2019: EF 20%, global hypokinesis, more prominent inferior/posterior walls, grade II DD, RV systolic function severely reduced, mild MR    Neuro/Psych negative neurological ROS  negative psych ROS   GI/Hepatic Neg liver ROS, hiatal hernia,   Endo/Other  diabetes, Type 2  Renal/GU Renal InsufficiencyRenal disease  negative genitourinary   Musculoskeletal negative musculoskeletal ROS (+)   Abdominal (+)  Abdomen: soft. Bowel sounds: normal.  Peds  Hematology  (+) anemia , Hgb 9.3, plt 126   Anesthesia Other Findings   Reproductive/Obstetrics negative OB ROS                           Anesthesia Physical Anesthesia Plan  ASA: IV  Anesthesia Plan: MAC   Post-op Pain Management:    Induction:   PONV Risk Score and Plan: Treatment may vary due to age or medical condition and Propofol infusion  Airway Management Planned: Natural Airway and Nasal Cannula  Additional Equipment: None  Intra-op Plan:   Post-operative Plan:   Informed Consent: I have reviewed the patients History and Physical, chart, labs and discussed the procedure including the risks, benefits  and alternatives for the proposed anesthesia with the patient or authorized representative who has indicated his/her understanding and acceptance.     Dental advisory given  Plan Discussed with: CRNA  Anesthesia Plan Comments: (Lab Results      Component                Value               Date                      WBC                      7.9                 05/03/2020                HGB                      9.5 (L)             05/03/2020                HCT                      28.0 (L)            05/03/2020                MCV  89.2                05/03/2020                PLT                      142 (L)             05/03/2020           Lab Results      Component                Value               Date                      NA                       134 (L)             05/02/2020                K                        4.1                 05/02/2020                CO2                      22                  05/02/2020                GLUCOSE                  99                  05/02/2020                BUN                      15                  05/02/2020                CREATININE               1.38 (H)            05/02/2020                CALCIUM                  8.6 (L)             05/02/2020                GFRNONAA                 53 (L)              05/02/2020                GFRAA                    59 (L)              09/05/2019          )  Anesthesia Quick Evaluation  

## 2020-05-02 NOTE — Progress Notes (Signed)
MD Collene Mares called writer to ask if patient's stool was clear. Updated her that it was not and verbal over given to administer 1 gallon of nulytely now. Order placed.

## 2020-05-02 NOTE — Telephone Encounter (Signed)
I spoke with the patient he states he will come have labs drawn on 05/04/2020 at [facility] lab.

## 2020-05-03 ENCOUNTER — Inpatient Hospital Stay (HOSPITAL_COMMUNITY): Payer: PPO | Admitting: Anesthesiology

## 2020-05-03 ENCOUNTER — Encounter (HOSPITAL_COMMUNITY)
Admission: EM | Disposition: A | Discharge status: Home / Self Care | Payer: Self-pay | Source: Home / Self Care | Attending: Internal Medicine

## 2020-05-03 ENCOUNTER — Encounter (HOSPITAL_COMMUNITY): Payer: Self-pay | Admitting: Internal Medicine

## 2020-05-03 HISTORY — PX: HOT HEMOSTASIS: SHX5433

## 2020-05-03 HISTORY — PX: ENTEROSCOPY: SHX5533

## 2020-05-03 HISTORY — PX: COLONOSCOPY WITH PROPOFOL: SHX5780

## 2020-05-03 LAB — CBC
HCT: 28 % — ABNORMAL LOW (ref 39.0–52.0)
Hemoglobin: 9.5 g/dL — ABNORMAL LOW (ref 13.0–17.0)
MCH: 30.3 pg (ref 26.0–34.0)
MCHC: 33.9 g/dL (ref 30.0–36.0)
MCV: 89.2 fL (ref 80.0–100.0)
Platelets: 142 10*3/uL — ABNORMAL LOW (ref 150–400)
RBC: 3.14 MIL/uL — ABNORMAL LOW (ref 4.22–5.81)
RDW: 18.5 % — ABNORMAL HIGH (ref 11.5–15.5)
WBC: 7.9 10*3/uL (ref 4.0–10.5)
nRBC: 0 % (ref 0.0–0.2)

## 2020-05-03 LAB — GLUCOSE, CAPILLARY
Glucose-Capillary: 82 mg/dL (ref 70–99)
Glucose-Capillary: 113 mg/dL — ABNORMAL HIGH (ref 70–99)
Glucose-Capillary: 72 mg/dL (ref 70–99)
Glucose-Capillary: 82 mg/dL (ref 70–99)

## 2020-05-03 SURGERY — COLONOSCOPY WITH PROPOFOL
Anesthesia: Monitor Anesthesia Care

## 2020-05-03 MED ORDER — APIXABAN 5 MG PO TABS: 5.0000 mg | ORAL_TABLET | Freq: Two times a day (BID) | ORAL | 5 refills | Status: DC

## 2020-05-03 MED ORDER — PROPOFOL 10 MG/ML IV BOLUS
INTRAVENOUS | Status: DC | PRN
  Administered 2020-05-03 (×2): 25 mg via INTRAVENOUS
  Administered 2020-05-03: 20 mg via INTRAVENOUS

## 2020-05-03 MED ORDER — LIDOCAINE 2% (20 MG/ML) 5 ML SYRINGE
INTRAMUSCULAR | Status: DC | PRN
  Administered 2020-05-03: 100 mg via INTRAVENOUS

## 2020-05-03 MED ORDER — PROPOFOL 500 MG/50ML IV EMUL
INTRAVENOUS | Status: DC | PRN
  Administered 2020-05-03: 100 ug/kg/min via INTRAVENOUS

## 2020-05-03 MED ORDER — CARVEDILOL 3.125 MG PO TABS: 3.1250 mg | ORAL_TABLET | Freq: Every day | ORAL | 0 refills | Status: DC

## 2020-05-03 MED ORDER — AMIODARONE HCL 100 MG PO TABS
100.0000 mg | ORAL_TABLET | Freq: Every day | ORAL | 0 refills | Status: DC
Start: 2020-05-04 — End: 2023-12-27

## 2020-05-03 MED ORDER — PHENYLEPHRINE 40 MCG/ML (10ML) SYRINGE FOR IV PUSH (FOR BLOOD PRESSURE SUPPORT)
PREFILLED_SYRINGE | INTRAVENOUS | Status: DC | PRN
  Administered 2020-05-03 (×2): 80 ug via INTRAVENOUS
  Administered 2020-05-03: 120 ug via INTRAVENOUS

## 2020-05-03 MED ORDER — LACTATED RINGERS IV SOLN: INTRAVENOUS | Status: DC | PRN

## 2020-05-03 SURGICAL SUPPLY — 25 items

## 2020-05-03 NOTE — Anesthesia Postprocedure Evaluation (Signed)
Anesthesia Post Note  Patient: Peter Lambert  Procedure(s) Performed: ESOPHAGOGASTRODUODENOSCOPY (EGD) WITH PROPOFOL (N/A ) COLONOSCOPY WITH PROPOFOL (N/A ) HOT HEMOSTASIS (ARGON PLASMA COAGULATION/BICAP) (N/A )     Patient location during evaluation: Endoscopy Anesthesia Type: MAC Level of consciousness: awake and alert Pain management: pain level controlled Vital Signs Assessment: post-procedure vital signs reviewed and stable Respiratory status: spontaneous breathing, nonlabored ventilation, respiratory function stable and patient connected to nasal cannula oxygen Cardiovascular status: stable and blood pressure returned to baseline Postop Assessment: no apparent nausea or vomiting Anesthetic complications: no   No complications documented.  Last Vitals:  Vitals:   05/03/20 1555 05/03/20 1621  BP: (!) 149/56 (!) 158/80  Pulse: 69 64  Resp: 16 18  Temp:    SpO2: 99% 99%    Last Pain:  Vitals:   05/03/20 1621  TempSrc:   PainSc: 0-No pain                 Belenda Cruise P Kasim Mccorkle

## 2020-05-03 NOTE — Telephone Encounter (Signed)
CBC done today, while patient is in patient. No INR was done. Patient was supposed to have had labs CBC, INR done tomorrow 05/04/2020. Please advise.Do we cancel this and notify the patient to disregard after discharge since he has had blood work up while in patient ?

## 2020-05-03 NOTE — Plan of Care (Signed)
Adequate for discharge.

## 2020-05-03 NOTE — Progress Notes (Signed)
Physical Therapy Treatment Patient Details Name: Peter Lambert MRN: [id number] DOB: May 14, 1945 Today's Date: 05/03/2020    History of Present Illness The pt is a 75 yo male presenting 3/4 with HGB of 6.8 and is s/p 1 unit PRBC. Pt recently presented 3/1 with similar sx, but left AMA 3/2 instead of being admitted. Noted new onset R knee anteromedial pain walking the hallways 3/8; PMH includes: HTN, NSTEMI, VF arrest s/p ICD, CHF, afib, PAD, HLD, DM II, CKD III, and anemia.    PT Comments    Continuing to follow; Pt politely declined getting OOB and ambulating in the hallways, as he was going to Endoscopy soon;  We discussed yesterday's session, and the knee pain he experienced, and that lead me to take a closer look at his R knee (no pain L knee); Perfomed closer examination of R knee, which revealed significant tenderness to palpation at medial joint line, some swelling anteromedial R knee, and noted R knee warmer than L knee; R knee ROM full and unchanged from yesterday's session; still with reports of feeling that his knee is stiff; Pt tells me he does have a history of gout, though it has always been in his foot; Notified Dr. [place] Crane and discussed with Juanda Crumble, RN;   From a PT standpoint, considering yesterday's session, he is moving well enough to dc home; He asks if his knee pain can be addressed as an outpt, and that is reasonable to me -- Provided Dr. Nevada Crane deems it appropriate  Follow Up Recommendations  Outpatient PT;Other (comment)Worth considering Outpt PT follow up for knee pain)     Equipment Recommendations  None recommended by PT    Recommendations for Other Services       Precautions / Restrictions Precautions Precautions: Other (comment) Precaution Comments: Monitor R knee pain when ambulating    Mobility  Bed Mobility Overal bed mobility: Independent                  Transfers                 General transfer comment: Declined OOB  activity/transfers  Ambulation/Gait                 Stairs             Wheelchair Mobility    Modified Rankin (Stroke Patients Only)       Balance                                            Cognition Arousal/Alertness: Awake/alert Behavior During Therapy: WFL for tasks assessed/performed Overall Cognitive Status: Within Functional Limits for tasks assessed                                        Exercises      General Comments General comments (skin integrity, edema, etc.): Perfomed closer examination of R knee, which revealed significant tenderness to palpation at medial joint line, some swelling anteromedial R knee, and noted R knee warmer than L knee; R knee ROM full and unchanged from yesterday's session; still with reports of feeling that his knee is stiff; No reports of pain in L knee      Pertinent Vitals/Pain Pain Assessment: Faces Faces Pain Scale: Hurts even more Pain  Location: R anteromedial knee; Grimace, and gasp with palpation R knee at medial joint line Pain Descriptors / Indicators: Grimacing Pain Intervention(s): Monitored during session;Repositioned;Other (comment) (Discussed with RN, and notified Dr. Nevada Crane)    Home Living                      Prior Function            PT Goals (current goals can now be found in the care plan section) Acute Rehab PT Goals Patient Stated Goal: return home PT Goal Formulation: With patient Time For Goal Achievement: 05/14/20 Potential to Achieve Goals: Good Progress towards PT goals: Progressing toward goals    Frequency    Min 3X/week      PT Plan Current plan remains appropriate    Co-evaluation              AM-PAC PT "6 Clicks" Mobility   Outcome Measure  Help needed turning from your back to your side while in a flat bed without using bedrails?: None Help needed moving from lying on your back to sitting on the side of a flat bed without  using bedrails?: None Help needed moving to and from a bed to a chair (including a wheelchair)?: None Help needed standing up from a chair using your arms (e.g., wheelchair or bedside chair)?: None Help needed to walk in hospital room?: A Little Help needed climbing 3-5 steps with a railing? : A Little 6 Click Score: 22    End of Session   Activity Tolerance: Patient tolerated treatment well Patient left: in bed;with call bell/phone within reach;Other (comment) (Waiting for endoscopy procedure) Nurse Communication: Other (comment) (R knee findings) PT Visit Diagnosis: Other abnormalities of gait and mobility (R26.89)     Time: [id number] PT Time Calculation (min) (ACUTE ONLY): 12 min  Charges:  $Therapeutic Activity: 8-22 mins                     Roney Marion, PT  [place] Pager [phone] Office [phone]    [name] 05/03/2020, 1:54 PM

## 2020-05-03 NOTE — Op Note (Addendum)
[facility] Patient Name: Peter Lambert Procedure Date : 05/03/2020 MRN: [id number] Attending MD: Carol Ada , MD Date of Birth: [birth year] CSN: [id number] Age: 75 Admit Type: Inpatient Procedure:                Colonoscopy Indications:              Iron deficiency anemia Providers:                Carol Ada, MD, Jobe Igo, RN, Elspeth Cho Tech., Technician Referring MD:              Medicines:                Propofol per Anesthesia Complications:            No immediate complications. Estimated Blood Loss:     Estimated blood loss: none. Procedure:                Pre-Anesthesia Assessment:                           - Prior to the procedure, a History and Physical                            was performed, and patient medications and                            allergies were reviewed. [name] tolerance of                            previous anesthesia was also reviewed. The risks                            and benefits of the procedure and the sedation                            options and risks were discussed with the patient.                            All questions were answered, and informed consent                            was obtained. Prior Anticoagulants: The patient has                            taken Eliquis (apixaban), last dose was 4 days                            prior to procedure. ASA Grade Assessment: III - A                            patient with severe systemic disease. After  reviewing the risks and benefits, the patient was                            deemed in satisfactory condition to undergo the                            procedure.                           - Sedation was administered by an anesthesia                            professional. Deep sedation was attained.                           After obtaining informed consent, the colonoscope                             was passed under direct vision. Throughout the                            procedure, the patient's blood pressure, pulse, and                            oxygen saturations were monitored continuously. The                            [id number] Olympus pediatric colonoscope                            was introduced through the anus and advanced to the                            the cecum, identified by appendiceal orifice and                            ileocecal valve. The colonoscopy was technically                            difficult and complex due to poor bowel prep.                            Successful completion of the procedure was aided by                            lavage. The patient tolerated the procedure well.                            The quality of the bowel preparation was adequate.                            The ileocecal valve, appendiceal orifice, and  rectum were photographed. Scope In: 3:14:05 PM Scope Out: 3:35:37 PM Scope Withdrawal Time: 0 hours 15 minutes 12 seconds  Total Procedure Duration: 0 hours 21 minutes 32 seconds  Findings:      The entire examined colon appeared normal. Extensive lavage was       performed and good to adequate views were obtained of the mucosa. Please       note, a prior colonoscopy in 12/2019 reported that the patient had       diverticula, however, none were noted during today's examination. Impression:               - The entire examined colon is normal.                           - No specimens collected. Recommendation:           - Return patient to hospital ward for ongoing care.                           - Resume regular diet.                           - Continue present medications.                           - Resume Eliquis in 3 days. Procedure Code(s):        --- Professional ---                           [phone], Colonoscopy, flexible; diagnostic, including                             collection of specimen(s) by brushing or washing,                            when performed (separate procedure) Diagnosis Code(s):        --- Professional ---                           D50.9, Iron deficiency anemia, unspecified CPT copyright 2019 American Medical Association. All rights reserved. The codes documented in this report are preliminary and upon coder review may  be revised to meet current compliance requirements. Carol Ada, MD Carol Ada, MD 05/03/2020 3:46:02 PM This report has been signed electronically. Number of Addenda: 0

## 2020-05-03 NOTE — Telephone Encounter (Signed)
No need to perform these tests now, he will need follow up appointment in 1 month

## 2020-05-03 NOTE — Transfer of Care (Signed)
Immediate Anesthesia Transfer of Care Note  Patient: Peter Lambert  Procedure(s) Performed: ESOPHAGOGASTRODUODENOSCOPY (EGD) WITH PROPOFOL (N/A ) COLONOSCOPY WITH PROPOFOL (N/A ) HOT HEMOSTASIS (ARGON PLASMA COAGULATION/BICAP) (N/A )  Patient Location: PACU  Anesthesia Type:MAC  Level of Consciousness: awake and patient cooperative  Airway & Oxygen Therapy: Patient Spontanous Breathing and Patient connected to nasal cannula oxygen  Post-op Assessment: Report given to RN and Post -op Vital signs reviewed and stable  Post vital signs: Reviewed and stable  Last Vitals:  Vitals Value Taken Time  BP 170/60 05/03/20 1542  Temp 36.4 C 05/03/20 1542  Pulse 69 05/03/20 1542  Resp 14 05/03/20 1552  SpO2 96 % 05/03/20 1542  Vitals shown include unvalidated device data.  Last Pain:  Vitals:   05/03/20 1542  TempSrc: Temporal  PainSc: Asleep      Patients Stated Pain Goal: 2 (09/15/80 8833)  Complications: No complications documented.

## 2020-05-03 NOTE — Progress Notes (Signed)
Patient off floor for procedure 

## 2020-05-03 NOTE — Telephone Encounter (Signed)
I Spoke with the patient's son Reita Cliche he is aware his father no longer needs the labs drawn on 05/04/2020 as he has had them drawn while in the hospital. Patient's son transferred to [place] to schedule the patient for a follow up appointment here at [facility].

## 2020-05-03 NOTE — Discharge Instructions (Signed)

## 2020-05-03 NOTE — Interval H&P Note (Signed)
History and Physical Interval Note:  05/03/2020 2:45 PM  Peter Lambert  has presented today for surgery, with the diagnosis of heme + stool, anemia.  The various methods of treatment have been discussed with the patient and family. After consideration of risks, benefits and other options for treatment, the patient has consented to  Procedure(s): ESOPHAGOGASTRODUODENOSCOPY (EGD) WITH PROPOFOL (N/A) COLONOSCOPY WITH PROPOFOL (N/A) as a surgical intervention.  [name] history has been reviewed, patient examined, no change in status, stable for surgery.  I have reviewed the patient's chart and labs.  Questions were answered to the patient's satisfaction.     Lorrin Nawrot D

## 2020-05-03 NOTE — Discharge Summary (Signed)
Discharge Summary  Peter Lambert [id number] DOB: [birth year]  PCP: Redmond School, MD  Admit date: 04/29/2020 Discharge date: 05/03/2020  Time spent: 35 minutes.  Recommendations for Outpatient Follow-up:  1. Follow-up with GI. 2. Follow-up with your cardiologist within a week regarding your cardiac medications. 3. Follow-up with your cardiologist in 1 to 2 weeks. 4. Follow-up with your primary care provider in 1 to 2 weeks 5. Take your medications as prescribed. 6. Continue fall precautions.  Discharge Diagnoses:  Active Hospital Problems   Diagnosis Date Noted  . GI bleed 01/04/2020  . AKI (acute kidney injury) ([place]) 04/30/2020  . Angiodysplasia of small intestine ([place]) 04/30/2020  . Acute blood loss anemia 01/04/2020  . CAD (coronary artery disease) 01/04/2020  . Atrial flutter [facility])     Resolved Hospital Problems  No resolved problems to display.    Discharge Condition: Stable.  Diet recommendation: Resume previous diet.  Vitals:   05/03/20 1555 05/03/20 1621  BP: (!) 149/56 (!) 158/80  Pulse: 69 64  Resp: 16 18  Temp:    SpO2: 99% 99%    History of present illness:  Peter Lambert Courtsis a 75 y.o.malewith medical history significant ofCAD status post PCI/DES to LAD in 05/2008, V. fib arrest/chronic systolic and diastolic CHF (EF 123456) status post ICD implant, atrial flutter on Eliquis, hypertension, hyperlipidemia, PAD, type 2 diabetes, CKD stage IIIB who was sent to the ED on 04/29/2020 from his cardiologist office due to symptomatic anemia. Work-up revealed hemoglobin 6.6K, in addition he had a positive FOBT, INR 6.1 on 04/26/2020. Patient was admitted and seen by GI. He received 1 unit PRBC on 04/29/2020. Per GI maintain hemoglobin greater than 8. Hemoglobin on 05/01/2020 down to 7.5 from 7.9.  Transfused another 1 unit PRBC on 05/01/2020.   Post EGD and colonoscopy completed on 05/03/2020.  Small bowel endoscopy revealed:  - Three non-bleeding angiodysplastic lesions  in the duodenum. Treated with a monopolar probe. - A few non-bleeding angiodysplastic lesions in the jejunum. Treated with a monopolar probe.  05/03/2020:  Seen and examined at his bedside.  He has no new complaints.  He is eager to go home.  Hospital Course:  Principal Problem:   GI bleed Active Problems:   Atrial flutter ([facility])   Acute blood loss anemia   CAD (coronary artery disease)   AKI (acute kidney injury) ([facility])   Angiodysplasia of small intestine ([facility])  Symptomatic acute blood loss anemia secondary to suspected GI bleed. Patient has a history of GI bleed, duodenal AVM, internal hemorrhoids and diverticulosis in the entire examined colon. Presented with hemoglobin less than 7, recent positive FOBT, recently elevated INR greater than 6. Received 1 unit PRBC on 04/29/2020. Transfused another unit of PRBC on 05/01/2020 to maintain hemoglobin above 8. Hemoglobin currently stable 9.5 from 9.3K. Post EGD and small bowel endoscopy completed on 05/03/2020.   EGD was unrevealing, the esophagus and the stomach were normal. The entire examined colon was normal. Small bowel endoscopy revealed:  - Three non-bleeding angiodysplastic lesions in the duodenum. Treated with a monopolar probe. - A few non-bleeding angiodysplastic lesions in the jejunum. Treated with a monopolar probe.  History of duodenal AVM, internal hemorrhoids, diverticulosis in the entire examined colon. Follow-up with GI outpatient.  Improving AKI on CKD 3A suspect prerenal in setting of dehydration, volume loss from suspected GI bleed. Baseline creatinine appears to be 1.42 with GFR of 51 Presented with creatinine of 2.07 with GFR of 33 Creatinine is downtrending 1.38 from  1.45. Avoid nephrotoxic agents, hypotension, or dehydration. Follow-up with your PCP in 1 to 2 weeks.  Resolved chronic constipation Continue home bowel regimen.  Resolved post IV fluid hydration: Mild non-anion gap metabolic acidosis likely  secondary to AKI Serum bicarb 22 from 21 and anion gap of 9  Essential hypertension Held home Lopressor, losartan, Lasix, isosorbide dinitrate, spironolactone due to hypotension and AKI on 05/01/2020. Reduced dose of Coreg down to 3.125 mg daily on 05/01/2020 Decrease dose of amiodarone 100 mg daily on 05/01/2020.] Follow-up with cardiology within a week.  GERD Continue daily Protonix 40 mg.  Hyperlipidemia Continue Lipitor 80 mg daily.  Physical debility PT assessed and had no further PT recommendations. Continue fall precautions.  Chronic combined diastolic and systolic CHF Last 2D echo done on 08/30/2019 showed LVEF 20%, left ventricle global hypokinesis.  Grade 2 diastolic dysfunction. Resume home Lasix and potassium replacement Follow-up with your cardiologist in 1 to 2 weeks.   Code Status: Full code.   Consultants:  GI.  Procedures:  None  Possible colonoscopy on Monday, 05/02/2020.  Antimicrobials:  None.    Discharge Exam: BP (!) 158/80 (BP Location: Right Arm)   Pulse 64   Temp (!) 97.5 F (36.4 C) (Temporal)   Resp 18   Ht '5\' 8"'$  (1.727 m)   Wt 75.1 kg   SpO2 99%   BMI 25.16 kg/m  . General: 75 y.o. year-old male well developed well nourished in no acute distress.  Alert and oriented x3. . Cardiovascular: Regular rate and rhythm with no rubs or gallops. Marland Kitchen Respiratory: Clear to auscultation with no wheezes or rales. Good inspiratory effort. . Abdomen:  Normal bowel sounds x4 quadrants.  Nontender. . Musculoskeletal: No lower extremity edema bilaterally. . Skin: No ulcerative lesions noted or rashes . Psychiatry: Mood is appropriate for condition and setting  Discharge Instructions You were cared for by a hospitalist during your hospital stay. If you have any questions about your discharge medications or the care you received while you were in the hospital after you are discharged, you can call the unit and asked to speak with the hospitalist  on call if the hospitalist that took care of you is not available. Once you are discharged, your primary care physician will handle any further medical issues. Please note that NO REFILLS for any discharge medications will be authorized once you are discharged, as it is imperative that you return to your primary care physician (or establish a relationship with a primary care physician if you do not have one) for your aftercare needs so that they can reassess your need for medications and monitor your lab values.   Allergies as of 05/03/2020   No Known Allergies     Medication List    STOP taking these medications   aspirin EC 81 MG tablet   isosorbide dinitrate 30 MG tablet Commonly known as: ISORDIL   losartan 25 MG tablet Commonly known as: COZAAR   metoprolol tartrate 25 MG tablet Commonly known as: LOPRESSOR   spironolactone 50 MG tablet Commonly known as: ALDACTONE     TAKE these medications   acetaminophen 325 MG tablet Commonly known as: TYLENOL Take 2 tablets (650 mg total) by mouth every 6 (six) hours as needed for mild pain, fever or headache (or Fever >/= 101).   albuterol 108 (90 Base) MCG/ACT inhaler Commonly known as: VENTOLIN HFA Inhale 2 puffs into the lungs every 4 (four) hours as needed for wheezing or shortness of breath.  amiodarone 100 MG tablet Commonly known as: PACERONE Take 1 tablet (100 mg total) by mouth daily. Start taking on: May 04, 2020 What changed:   medication strength  how much to take   apixaban 5 MG Tabs tablet Commonly known as: ELIQUIS Take 1 tablet (5 mg total) by mouth 2 (two) times daily. Start taking on: May 06, 2020 What changed: These instructions start on May 06, 2020. If you are unsure what to do until then, ask your doctor or other care provider.   atorvastatin 80 MG tablet Commonly known as: LIPITOR Take 80 mg by mouth daily.   carvedilol 3.125 MG tablet Commonly known as: COREG Take 1 tablet (3.125 mg total)  by mouth daily with breakfast. Start taking on: May 04, 2020 What changed:   medication strength  how much to take  when to take this   cyanocobalamin 1000 MCG/ML injection Commonly known as: (VITAMIN B-12) Inject 1,000 mcg into the muscle every 30 (thirty) days.   cyanocobalamin 1000 MCG tablet Take 1 tablet (1,000 mcg total) by mouth daily.   ferrous sulfate 325 (65 FE) MG tablet Commonly known as: FerrouSul Take 1 tablet (325 mg total) by mouth daily with breakfast.   fluocinonide cream 0.05 % Commonly known as: LIDEX Apply 1 application topically 2 (two) times daily as needed (rash).   furosemide 20 MG tablet Commonly known as: LASIX Take 20 mg by mouth daily.   guaiFENesin 600 MG 12 hr tablet Commonly known as: MUCINEX Take 600 mg by mouth 2 (two) times daily as needed for cough or to loosen phlegm.   lubiprostone 8 MCG capsule Commonly known as: Amitiza Take 1 capsule (8 mcg total) by mouth 2 (two) times daily with a meal. What changed: when to take this   Melatonin 3 MG Caps Take 6 mg by mouth at bedtime as needed (sleep).   nitroGLYCERIN 0.4 MG SL tablet Commonly known as: NITROSTAT PLACE 1 TABLET UNDER THE TONGUE EVERY 5 MINUTES AS NEEDED FOR CHEST PAIN What changed: See the new instructions.   omeprazole 20 MG capsule Commonly known as: PRILOSEC Take 20 mg by mouth daily.   potassium chloride SA 20 MEQ tablet Commonly known as: KLOR-CON Take 20 mEq by mouth daily.   traZODone 50 MG tablet Commonly known as: DESYREL Take 50 mg by mouth at bedtime as needed for sleep.      No Known Allergies  Follow-up Information    Redmond School, MD. Call in 1 day(s).   Specialty: Internal Medicine Why: Please call for a post hospital follow-up appointment. Contact information: [address] [place] [id number] [phone]        Lorretta Harp, MD. Call in 1 day(s).   Specialties: Cardiology, Radiology Why: Please call ASAP to make a  post hospital follow-up appointment. Contact information: [address] [place] [address] [phone]        Carol Ada, MD. Call in 1 day(s).   Specialty: Gastroenterology Why: Please call for a post hospital follow-up appointment. Contact information: [place], [place] [address] [phone]                The results of significant diagnostics from this hospitalization (including imaging, microbiology, ancillary and laboratory) are listed below for reference.    Significant Diagnostic Studies: DG Abd 1 View  Result Date: 04/30/2020 CLINICAL DATA:  Initial evaluation for abdominal distension. EXAM: ABDOMEN - 1 VIEW COMPARISON:  Prior study from 02/03/2020. FINDINGS: Surgical  clips overlie the mid abdomen. Subcentimeter calcific density overlying the right upper quadrant suggestive of cholelithiasis. Multiple mildly prominent gas-filled loops of predominantly large bowel seen within the abdomen. Gas overlies the rectal vault. Finding is nonspecific, but could reflect underlying ileus. No significant small bowel dilatation is visible. No significant bowel wall thickening. No visible free air on this limited supine view the abdomen. No other soft tissue mass. Degenerative changes noted within lower lumbar spine. Symmetric sclerosis noted about the SI joints bilaterally. Few additional surgical clips overlie the right hip. IMPRESSION: 1. Multiple mildly prominent gas-filled loops of predominantly large bowel within the abdomen, nonspecific, but could reflect an underlying ileus. No significant small bowel dilatation or ovarian evidence for mechanical obstruction. 2. Probable cholelithiasis. Electronically Signed   By: Jeannine Boga M.D.   On: 04/30/2020 01:49   DG Chest Portable 1 View  Result Date: 04/26/2020 CLINICAL DATA:  Weakness for 2 hours EXAM: PORTABLE CHEST 1 VIEW COMPARISON:  01/04/2020 FINDINGS: Single frontal view of the  chest demonstrates dual lead pacer unchanged. Cardiac silhouette is stable. No airspace disease, effusion, or pneumothorax. IMPRESSION: 1. Stable chest, no acute process. Electronically Signed   By: Randa Ngo M.D.   On: 04/26/2020 17:07    Microbiology: Recent Results (from the past 240 hour(s))  SARS CORONAVIRUS 2 (TAT 6-24 HRS) Nasopharyngeal Nasopharyngeal Swab     Status: None   Collection Time: 04/30/20  8:49 AM   Specimen: Nasopharyngeal Swab  Result Value Ref Range Status   SARS Coronavirus 2 NEGATIVE NEGATIVE Final    Comment: (NOTE) SARS-CoV-2 target nucleic acids are NOT DETECTED.  The SARS-CoV-2 RNA is generally detectable in upper and lower respiratory specimens during the acute phase of infection. Negative results do not preclude SARS-CoV-2 infection, do not rule out co-infections with other pathogens, and should not be used as the sole basis for treatment or other patient management decisions. Negative results must be combined with clinical observations, patient history, and epidemiological information. The expected result is Negative.  Fact Sheet for Patients: SugarRoll.be  Fact Sheet for Healthcare Providers: https://www.woods-mathews.com/  This test is not yet approved or cleared by the [place] FDA and  has been authorized for detection and/or diagnosis of SARS-CoV-2 by FDA under an Emergency Use Authorization (EUA). This EUA will remain  in effect (meaning this test can be used) for the duration of the COVID-19 declaration under Se ction 564(b)(1) of the Act, 21 U.S.C. section 360bbb-3(b)(1), unless the authorization is terminated or revoked sooner.  Performed at [facility], [place] [address] [place], [place] [address]      Labs: Basic Metabolic Panel: Recent Labs  Lab 04/29/20 2010 04/30/20 1503 05/01/20 0235 05/02/20 0351  NA 137 137 136 134*  K 4.1 4.0 3.8 4.1  CL 107 109 105 104  CO2 21*  '23 22 22  '$ GLUCOSE 122* 120* 108* 99  BUN 41* 29* 24* 15  CREATININE 2.07* 1.48* 1.45* 1.38*  CALCIUM 8.5* 8.3* 8.5* 8.6*  MG  --   --   --  1.9  PHOS  --   --   --  3.5   Liver Function Tests: Recent Labs  Lab 04/29/20 2010  AST 20  ALT 18  ALKPHOS 57  BILITOT 0.8  PROT 6.1*  ALBUMIN 3.5   No results for input(s): LIPASE, AMYLASE in the last 168 hours. No results for input(s): AMMONIA in the last 168 hours. CBC: Recent Labs  Lab 04/29/20 1059 04/29/20 2010  04/30/20 0110 05/01/20 0235 05/01/20 1147 05/01/20 1946 05/02/20 0351 05/03/20 0844  WBC 9.9 9.4  --  8.3  --   --  8.3 7.9  HGB 6.8* 6.6*   < > 7.9* 7.5* 9.2* 9.3* 9.5*  HCT 19.8* 21.1*   < > 24.7* 23.0* 28.8* 28.5* 28.0*  MCV 90 98.1  --  95.7  --   --  90.2 89.2  PLT 193 198  --  143*  --   --  126* 142*   < > = values in this interval not displayed.   Cardiac Enzymes: No results for input(s): CKTOTAL, CKMB, CKMBINDEX, TROPONINI in the last 168 hours. BNP: BNP (last 3 results) Recent Labs    08/29/19 1819  BNP 202.2*    ProBNP (last 3 results) No results for input(s): PROBNP in the last 8760 hours.  CBG: Recent Labs  Lab 05/02/20 2347 05/03/20 0358 05/03/20 0800 05/03/20 1209 05/03/20 1620  GLUCAP 75 82 113* 72 82       Signed:  Kayleen Memos, MD [facility] 05/03/2020, 5:10 PM

## 2020-05-03 NOTE — Op Note (Addendum)
[facility] Patient Name: Peter Lambert Procedure Date : 05/03/2020 MRN: [id number] Attending MD: Carol Ada , MD Date of Birth: [birth year] CSN: [id number] Age: 75 Admit Type: Inpatient Procedure:                Small bowel enteroscopy Indications:              Iron deficiency anemia Providers:                Carol Ada, MD, Jobe Igo, RN, Elspeth Cho Tech., Technician Referring MD:              Medicines:                Propofol per Anesthesia Complications:            No immediate complications. Estimated Blood Loss:     Estimated blood loss: none. Procedure:                Pre-Anesthesia Assessment:                           - Prior to the procedure, a History and Physical                            was performed, and patient medications and                            allergies were reviewed. [name] tolerance of                            previous anesthesia was also reviewed. The risks                            and benefits of the procedure and the sedation                            options and risks were discussed with the patient.                            All questions were answered, and informed consent                            was obtained. Prior Anticoagulants: The patient has                            taken Eliquis (apixaban), last dose was 4 days                            prior to procedure. ASA Grade Assessment: III - A                            patient with severe systemic disease. After  reviewing the risks and benefits, the patient was                            deemed in satisfactory condition to undergo the                            procedure.                           - Sedation was administered by an anesthesia                            professional. Deep sedation was attained.                           After obtaining informed consent, the endoscope was                             passed under direct vision. Throughout the                            procedure, the patient's blood pressure, pulse, and                            oxygen saturations were monitored continuously. The                            [id number] Olympus pediatric colonoscope                            was introduced through the mouth, and advanced to                            the small bowel distal to the Ligament of Treitz.                            After obtaining informed consent, the endoscope was                            passed under direct vision. Throughout the                            procedure, the patient's blood pressure, pulse, and                            oxygen saturations were monitored continuously.The                            small bowel enteroscopy was accomplished without                            difficulty. The patient tolerated the procedure                            well. Scope  In: Scope Out: Findings:      The esophagus was normal.      The stomach was normal.      Three angiodysplastic lesions with no bleeding were found in the       duodenal bulb and in the second portion of the duodenum. Coagulation for       tissue destruction using monopolar probe was successful.      A few angiodysplastic lesions with no bleeding were found in the       proximal jejunum. Coagulation for tissue destruction using monopolar       probe was successful. Impression:               - Normal esophagus.                           - Normal stomach.                           - Three non-bleeding angiodysplastic lesions in the                            duodenum. Treated with a monopolar probe.                           - A few non-bleeding angiodysplastic lesions in the                            jejunum. Treated with a monopolar probe.                           - No specimens collected. Recommendation:           - Proceed with the colonoscopy. Procedure  Code(s):        --- Professional ---                           [phone], Small intestinal endoscopy, enteroscopy                            beyond second portion of duodenum, not including                            ileum; with ablation of tumor(s), polyp(s), or                            other lesion(s) not amenable to removal by hot                            biopsy forceps, bipolar cautery or snare technique Diagnosis Code(s):        --- Professional ---                           [id number], Angiodysplasia of stomach and duodenum                            without bleeding  K55.20, Angiodysplasia of colon without hemorrhage                           D50.9, Iron deficiency anemia, unspecified CPT copyright 2019 [employer]. All rights reserved. The codes documented in this report are preliminary and upon coder review may  be revised to meet current compliance requirements. Carol Ada, MD Carol Ada, MD 05/03/2020 3:42:47 PM This report has been signed electronically. Number of Addenda: 0

## 2020-05-03 NOTE — Anesthesia Procedure Notes (Signed)
Procedure Name: MAC Date/Time: 05/03/2020 2:45 PM Performed by: Renato Shin, CRNA Pre-anesthesia Checklist: Patient identified, Emergency Drugs available, Suction available and Patient being monitored Patient Re-evaluated:Patient Re-evaluated prior to induction Oxygen Delivery Method: Nasal cannula Preoxygenation: Pre-oxygenation with 100% oxygen Induction Type: IV induction Placement Confirmation: positive ETCO2 and breath sounds checked- equal and bilateral Dental Injury: Teeth and Oropharynx as per pre-operative assessment

## 2020-05-04 ENCOUNTER — Encounter (HOSPITAL_COMMUNITY): Payer: Self-pay | Admitting: Gastroenterology

## 2020-05-04 DIAGNOSIS — D510 Vitamin B12 deficiency anemia due to intrinsic factor deficiency: Secondary | ICD-10-CM | POA: Diagnosis not present

## 2020-05-04 DIAGNOSIS — M109 Gout, unspecified: Secondary | ICD-10-CM | POA: Diagnosis not present

## 2020-05-04 DIAGNOSIS — Z6826 Body mass index (BMI) 26.0-26.9, adult: Secondary | ICD-10-CM | POA: Diagnosis not present

## 2020-05-04 DIAGNOSIS — E663 Overweight: Secondary | ICD-10-CM | POA: Diagnosis not present

## 2020-05-06 NOTE — Progress Notes (Signed)
Patient's niece, Kyra Searles Little called and stated patient was admitted to [facility] earlier this month and they did both procedures then and need to cancel his procedures for March 18th. Called and left Eulis Canner at Dr. Colman Cater office a message of the above.

## 2020-05-09 ENCOUNTER — Ambulatory Visit (INDEPENDENT_AMBULATORY_CARE_PROVIDER_SITE_OTHER): Payer: PPO | Admitting: Gastroenterology

## 2020-05-09 DIAGNOSIS — D509 Iron deficiency anemia, unspecified: Secondary | ICD-10-CM | POA: Diagnosis not present

## 2020-05-09 DIAGNOSIS — K5520 Angiodysplasia of colon without hemorrhage: Secondary | ICD-10-CM | POA: Diagnosis not present

## 2020-05-09 DIAGNOSIS — K921 Melena: Secondary | ICD-10-CM | POA: Diagnosis not present

## 2020-05-10 DIAGNOSIS — K922 Gastrointestinal hemorrhage, unspecified: Secondary | ICD-10-CM | POA: Diagnosis not present

## 2020-05-10 DIAGNOSIS — I4891 Unspecified atrial fibrillation: Secondary | ICD-10-CM | POA: Diagnosis not present

## 2020-05-10 DIAGNOSIS — Z6826 Body mass index (BMI) 26.0-26.9, adult: Secondary | ICD-10-CM | POA: Diagnosis not present

## 2020-05-10 DIAGNOSIS — I5023 Acute on chronic systolic (congestive) heart failure: Secondary | ICD-10-CM | POA: Diagnosis not present

## 2020-05-10 DIAGNOSIS — N183 Chronic kidney disease, stage 3 unspecified: Secondary | ICD-10-CM | POA: Diagnosis not present

## 2020-05-10 DIAGNOSIS — Z1331 Encounter for screening for depression: Secondary | ICD-10-CM | POA: Diagnosis not present

## 2020-05-10 DIAGNOSIS — E1129 Type 2 diabetes mellitus with other diabetic kidney complication: Secondary | ICD-10-CM | POA: Diagnosis not present

## 2020-05-11 ENCOUNTER — Encounter (HOSPITAL_COMMUNITY): Admission: RE | Admit: 2020-05-11 | Payer: PPO | Source: Ambulatory Visit

## 2020-05-11 ENCOUNTER — Other Ambulatory Visit (HOSPITAL_COMMUNITY): Payer: PPO

## 2020-05-13 ENCOUNTER — Encounter (HOSPITAL_COMMUNITY): Payer: Self-pay

## 2020-05-13 ENCOUNTER — Ambulatory Visit (HOSPITAL_COMMUNITY): Admit: 2020-05-13 | Payer: PPO | Admitting: Gastroenterology

## 2020-05-13 SURGERY — COLONOSCOPY WITH PROPOFOL
Anesthesia: Monitor Anesthesia Care

## 2020-05-25 DIAGNOSIS — N183 Chronic kidney disease, stage 3 unspecified: Secondary | ICD-10-CM | POA: Diagnosis not present

## 2020-05-25 DIAGNOSIS — E1122 Type 2 diabetes mellitus with diabetic chronic kidney disease: Secondary | ICD-10-CM | POA: Diagnosis not present

## 2020-05-25 DIAGNOSIS — J449 Chronic obstructive pulmonary disease, unspecified: Secondary | ICD-10-CM | POA: Diagnosis not present

## 2020-05-25 DIAGNOSIS — E7849 Other hyperlipidemia: Secondary | ICD-10-CM | POA: Diagnosis not present

## 2020-05-28 ENCOUNTER — Other Ambulatory Visit (INDEPENDENT_AMBULATORY_CARE_PROVIDER_SITE_OTHER): Payer: Self-pay | Admitting: Gastroenterology

## 2020-05-28 DIAGNOSIS — J449 Chronic obstructive pulmonary disease, unspecified: Secondary | ICD-10-CM | POA: Diagnosis not present

## 2020-05-28 DIAGNOSIS — D500 Iron deficiency anemia secondary to blood loss (chronic): Secondary | ICD-10-CM

## 2020-05-30 NOTE — Telephone Encounter (Signed)
Last seen by Dr. Jenetta Downer 04/11/2020 for Fe Def Anemia

## 2020-05-31 DIAGNOSIS — J449 Chronic obstructive pulmonary disease, unspecified: Secondary | ICD-10-CM | POA: Diagnosis not present

## 2020-06-02 ENCOUNTER — Ambulatory Visit (INDEPENDENT_AMBULATORY_CARE_PROVIDER_SITE_OTHER): Payer: PPO | Admitting: Internal Medicine

## 2020-06-02 DIAGNOSIS — E538 Deficiency of other specified B group vitamins: Secondary | ICD-10-CM | POA: Diagnosis not present

## 2020-06-06 ENCOUNTER — Ambulatory Visit (INDEPENDENT_AMBULATORY_CARE_PROVIDER_SITE_OTHER): Payer: PPO

## 2020-06-06 DIAGNOSIS — I4901 Ventricular fibrillation: Secondary | ICD-10-CM | POA: Diagnosis not present

## 2020-06-06 DIAGNOSIS — I469 Cardiac arrest, cause unspecified: Secondary | ICD-10-CM

## 2020-06-07 ENCOUNTER — Ambulatory Visit (INDEPENDENT_AMBULATORY_CARE_PROVIDER_SITE_OTHER): Payer: PPO | Admitting: Internal Medicine

## 2020-06-07 ENCOUNTER — Other Ambulatory Visit: Payer: Self-pay

## 2020-06-07 ENCOUNTER — Encounter (INDEPENDENT_AMBULATORY_CARE_PROVIDER_SITE_OTHER): Payer: Self-pay | Admitting: Internal Medicine

## 2020-06-07 VITALS — BP 167/74 | HR 84 | Temp 98.2°F | Ht 68.0 in | Wt 177.0 lb

## 2020-06-07 DIAGNOSIS — K5900 Constipation, unspecified: Secondary | ICD-10-CM | POA: Diagnosis not present

## 2020-06-07 DIAGNOSIS — D500 Iron deficiency anemia secondary to blood loss (chronic): Secondary | ICD-10-CM | POA: Diagnosis not present

## 2020-06-07 LAB — CUP PACEART REMOTE DEVICE CHECK
Battery Remaining Longevity: 156 mo
Battery Remaining Percentage: 100 %
Brady Statistic RA Percent Paced: 44 %
Brady Statistic RV Percent Paced: 0 %
Date Time Interrogation Session: 20220411022100
HighPow Impedance: 69 Ohm
Implantable Lead Implant Date: 20210709
Implantable Lead Implant Date: 20210709
Implantable Lead Location: 753859
Implantable Lead Location: 753860
Implantable Lead Model: 137
Implantable Lead Model: 7841
Implantable Lead Serial Number: 1063971
Implantable Lead Serial Number: 300935
Implantable Pulse Generator Implant Date: 20210709
Lead Channel Impedance Value: 458 Ohm
Lead Channel Impedance Value: 768 Ohm
Lead Channel Setting Pacing Amplitude: 2 V
Lead Channel Setting Pacing Amplitude: 2.2 V
Lead Channel Setting Pacing Pulse Width: 0.4 ms
Lead Channel Setting Sensing Sensitivity: 0.5 mV
Pulse Gen Serial Number: 224993

## 2020-06-07 MED ORDER — POLYETHYLENE GLYCOL 3350 17 GM/SCOOP PO POWD: 17.0000 g | Freq: Every day | ORAL | 0 refills | Status: DC

## 2020-06-07 MED ORDER — METAMUCIL SMOOTH TEXTURE 58.6 % PO POWD: 1.0000 | Freq: Every day | ORAL | Status: DC

## 2020-06-07 NOTE — Progress Notes (Signed)
Presenting complaint;  Follow for iron deficiency anemia secondary to GI bleed.  Database and subjective:  Patient is 74 year old African-American male was history of iron deficiency anemia secondary to GI bleed for which she was initially evaluated in November 2021 when he had EGD and colonoscopy.  He has history of atrial fibrillation and is chronically anticoagulated. His hemoglobin dropped again and he was admitted to [facility] about a month ago.  He underwent small bowel enteroscopy with ablation of duodenal and jejunal AV malformations.  Colonoscopy was unremarkable.  He was transfused during that admission.  He did follow with Dr. Benson Norway and he states his hemoglobin was stable.   He says he is doing well.  His bowels move every other day.  Sometimes he has to take laxative.  He denies rectal bleeding.  Stool has been black but he is on iron.  His appetite is good.  He denies abdominal pain nausea vomiting.  He also denies heartburn or dysphagia.  He says he is not taking aspirin anymore.  He does not take other OTC NSAIDs.  Current Medications: Outpatient Encounter Medications as of 06/07/2020  Medication Sig  . acetaminophen (TYLENOL) 325 MG tablet Take 2 tablets (650 mg total) by mouth every 6 (six) hours as needed for mild pain, fever or headache (or Fever >/= 101).  Marland Kitchen albuterol (VENTOLIN HFA) 108 (90 Base) MCG/ACT inhaler Inhale 2 puffs into the lungs every 4 (four) hours as needed for wheezing or shortness of breath.  Marland Kitchen amiodarone (PACERONE) 100 MG tablet Take 1 tablet (100 mg total) by mouth daily.  Marland Kitchen apixaban (ELIQUIS) 5 MG TABS tablet Take 1 tablet (5 mg total) by mouth 2 (two) times daily.  Marland Kitchen atorvastatin (LIPITOR) 80 MG tablet Take 80 mg by mouth daily.  . carvedilol (COREG) 3.125 MG tablet Take 1 tablet (3.125 mg total) by mouth daily with breakfast.  . [facility] GENTLE LAXATIVE 5 MG EC tablet TAKE 1 TABLET BY MOUTH DAILY AS NEEDED FOR MODERATE CONSTIPATION. TAKE TWO DAYS BEFORE  PROCEDURE (Patient taking differently: Take 5 mg by mouth daily as needed.)  . cyanocobalamin (,VITAMIN B-12,) 1000 MCG/ML injection Inject 1,000 mcg into the muscle every 30 (thirty) days.  . ferrous sulfate (FERROUSUL) 325 (65 FE) MG tablet Take 1 tablet (325 mg total) by mouth daily with breakfast.  . fluocinonide cream (LIDEX) 8.11 % Apply 1 application topically 2 (two) times daily as needed (rash).  . furosemide (LASIX) 20 MG tablet Take 20 mg by mouth daily.  Marland Kitchen guaiFENesin (MUCINEX) 600 MG 12 hr tablet Take 600 mg by mouth 2 (two) times daily as needed for cough or to loosen phlegm.  . nitroGLYCERIN (NITROSTAT) 0.4 MG SL tablet PLACE 1 TABLET UNDER THE TONGUE EVERY 5 MINUTES AS NEEDED FOR CHEST PAIN (Patient taking differently: Place 0.4 mg under the tongue every 5 (five) minutes as needed. PLACE 1 TABLET UNDER THE TONGUE EVERY 5 MINUTES AS NEEDED FOR CHEST PAIN)  . potassium chloride SA (KLOR-CON) 20 MEQ tablet Take 20 mEq by mouth daily.  . traZODone (DESYREL) 50 MG tablet Take 50 mg by mouth at bedtime as needed for sleep.  . vitamin B-12 1000 MCG tablet Take 1 tablet (1,000 mcg total) by mouth daily.  Marland Kitchen lubiprostone (AMITIZA) 8 MCG capsule Take 1 capsule (8 mcg total) by mouth 2 (two) times daily with a meal. (Patient not taking: Reported on 06/07/2020)  . Melatonin 3 MG CAPS Take 6 mg by mouth at bedtime as needed (sleep). (  Patient not taking: Reported on 06/07/2020)  . omeprazole (PRILOSEC) 20 MG capsule Take 20 mg by mouth daily. (Patient not taking: Reported on 06/07/2020)  . [DISCONTINUED] potassium chloride (KLOR-CON) 20 MEQ packet TAKE 1 TABLET BY MOUTH EVERY DAY   No facility-administered encounter medications on file as of 06/07/2020.     Objective: Blood pressure (!) 167/74, pulse 84, temperature 98.2 F (36.8 C), temperature source Oral, height 5' 8"  (1.727 m), weight 177 lb (80.3 kg). Patient is alert and in no acute distress. He is wearing a mask. Conjunctiva is pink.  Sclera is nonicteric Oropharyngeal mucosa is normal. No neck masses or thyromegaly noted. Cardiac exam with regular rhythm normal S1 and S2. No murmur or gallop noted. Lungs are clear to auscultation. Abdomen is symmetrical soft and nontender with organomegaly or masses. No LE edema or clubbing noted.  Labs/studies Results:  CBC Latest Ref Rng & Units 05/03/2020 05/02/2020 05/01/2020  WBC 4.0 - 10.5 K/uL 7.9 8.3 -  Hemoglobin 13.0 - 17.0 g/dL 9.5(L) 9.3(L) 9.2(L)  Hematocrit 39.0 - 52.0 % 28.0(L) 28.5(L) 28.8(L)  Platelets 150 - 400 K/uL 142(L) 126(L) -    CMP Latest Ref Rng & Units 05/02/2020 05/01/2020 04/30/2020  Glucose 70 - 99 mg/dL 99 108(H) 120(H)  BUN 8 - 23 mg/dL 15 24(H) 29(H)  Creatinine 0.61 - 1.24 mg/dL 1.38(H) 1.45(H) 1.48(H)  Sodium 135 - 145 mmol/L 134(L) 136 137  Potassium 3.5 - 5.1 mmol/L 4.1 3.8 4.0  Chloride 98 - 111 mmol/L 104 105 109  CO2 22 - 32 mmol/L 22 22 23   Calcium 8.9 - 10.3 mg/dL 8.6(L) 8.5(L) 8.3(L)  Total Protein 6.5 - 8.1 g/dL - - -  Total Bilirubin 0.3 - 1.2 mg/dL - - -  Alkaline Phos 38 - 126 U/L - - -  AST 15 - 41 U/L - - -  ALT 0 - 44 U/L - - -    Hepatic Function Latest Ref Rng & Units 04/29/2020 04/26/2020 02/04/2020  Total Protein 6.5 - 8.1 g/dL 6.1(L) 6.4(L) 6.9  Albumin 3.5 - 5.0 g/dL 3.5 3.5 3.5  AST 15 - 41 U/L 20 16 16   ALT 0 - 44 U/L 18 17 18   Alk Phosphatase 38 - 126 U/L 57 42 76  Total Bilirubin 0.3 - 1.2 mg/dL 0.8 0.3 0.5  Bilirubin, Direct 0.00 - 0.40 mg/dL - - -      Assessment:  #1.  History of iron deficiency anemia secondary to chronic/recurrent GI bleed most likely from small bowel angiodysplasia.  He had overt bleed in November 2021 requiring transfusion and he had another episode 1 month ago.  He is chronically anticoagulated for history of atrial flutter but he is not on aspirin anymore.  His stool is black because he is on iron.  He appears to be doing well symptomatically.  Plan:  CBC and Hemoccult x1 in 2 weeks. Patient will  call if he has postural symptoms. Office visit in 3 months.

## 2020-06-07 NOTE — Patient Instructions (Signed)
CBC and Hemoccult in 2 weeks.

## 2020-06-08 NOTE — Progress Notes (Incomplete)
Presenting complaint;  Follow for iron deficiency anemia secondary to GI bleed.  Database and subjective:  Patient is 75 year old African-American male was history of iron deficiency anemia secondary to GI bleed for which she was initially evaluated in November 2021 when he had EGD and colonoscopy.  He has history of atrial fibrillation and is chronically anticoagulated. His hemoglobin dropped again and he was admitted to [facility] about a month ago.  He underwent small bowel enteroscopy with ablation of duodenal and jejunal AV malformations.  Colonoscopy was unremarkable.  He was transfused during that admission.  He did follow with Dr. Benson Norway and he states his hemoglobin was stable.   He says he is doing well.  His bowels move every other day.  Sometimes he has to take laxative.  He denies rectal bleeding.  Stool has been black but he is on iron.  His appetite is good.  He denies abdominal pain nausea vomiting.  He also denies heartburn or dysphagia.  He says he is not taking aspirin anymore.  He does not take other OTC NSAIDs.  Current Medications: Outpatient Encounter Medications as of 06/07/2020  Medication Sig  . acetaminophen (TYLENOL) 325 MG tablet Take 2 tablets (650 mg total) by mouth every 6 (six) hours as needed for mild pain, fever or headache (or Fever >/= 101).  Marland Kitchen albuterol (VENTOLIN HFA) 108 (90 Base) MCG/ACT inhaler Inhale 2 puffs into the lungs every 4 (four) hours as needed for wheezing or shortness of breath.  Marland Kitchen amiodarone (PACERONE) 100 MG tablet Take 1 tablet (100 mg total) by mouth daily.  Marland Kitchen apixaban (ELIQUIS) 5 MG TABS tablet Take 1 tablet (5 mg total) by mouth 2 (two) times daily.  Marland Kitchen atorvastatin (LIPITOR) 80 MG tablet Take 80 mg by mouth daily.  . carvedilol (COREG) 3.125 MG tablet Take 1 tablet (3.125 mg total) by mouth daily with breakfast.  . [facility] GENTLE LAXATIVE 5 MG EC tablet TAKE 1 TABLET BY MOUTH DAILY AS NEEDED FOR MODERATE CONSTIPATION. TAKE TWO DAYS BEFORE  PROCEDURE (Patient taking differently: Take 5 mg by mouth daily as needed.)  . cyanocobalamin (,VITAMIN B-12,) 1000 MCG/ML injection Inject 1,000 mcg into the muscle every 30 (thirty) days.  . ferrous sulfate (FERROUSUL) 325 (65 FE) MG tablet Take 1 tablet (325 mg total) by mouth daily with breakfast.  . fluocinonide cream (LIDEX) 4.09 % Apply 1 application topically 2 (two) times daily as needed (rash).  . furosemide (LASIX) 20 MG tablet Take 20 mg by mouth daily.  Marland Kitchen guaiFENesin (MUCINEX) 600 MG 12 hr tablet Take 600 mg by mouth 2 (two) times daily as needed for cough or to loosen phlegm.  . nitroGLYCERIN (NITROSTAT) 0.4 MG SL tablet PLACE 1 TABLET UNDER THE TONGUE EVERY 5 MINUTES AS NEEDED FOR CHEST PAIN (Patient taking differently: Place 0.4 mg under the tongue every 5 (five) minutes as needed. PLACE 1 TABLET UNDER THE TONGUE EVERY 5 MINUTES AS NEEDED FOR CHEST PAIN)  . potassium chloride SA (KLOR-CON) 20 MEQ tablet Take 20 mEq by mouth daily.  . traZODone (DESYREL) 50 MG tablet Take 50 mg by mouth at bedtime as needed for sleep.  . vitamin B-12 1000 MCG tablet Take 1 tablet (1,000 mcg total) by mouth daily.  Marland Kitchen lubiprostone (AMITIZA) 8 MCG capsule Take 1 capsule (8 mcg total) by mouth 2 (two) times daily with a meal. (Patient not taking: Reported on 06/07/2020)  . Melatonin 3 MG CAPS Take 6 mg by mouth at bedtime as needed (sleep). (  Patient not taking: Reported on 06/07/2020)  . omeprazole (PRILOSEC) 20 MG capsule Take 20 mg by mouth daily. (Patient not taking: Reported on 06/07/2020)  . [DISCONTINUED] potassium chloride (KLOR-CON) 20 MEQ packet TAKE 1 TABLET BY MOUTH EVERY DAY   No facility-administered encounter medications on file as of 06/07/2020.     Objective: Blood pressure (!) 167/74, pulse 84, temperature 98.2 F (36.8 C), temperature source Oral, height 5' 8"  (1.727 m), weight 177 lb (80.3 kg). Patient is alert and in no acute distress. He is wearing a mask. Conjunctiva is pink.  Sclera is nonicteric Oropharyngeal mucosa is normal. No neck masses or thyromegaly noted. Cardiac exam with regular rhythm normal S1 and S2. No murmur or gallop noted. Lungs are clear to auscultation. Abdomen No LE edema or clubbing noted.  Labs/studies Results:  CBC Latest Ref Rng & Units 05/03/2020 05/02/2020 05/01/2020  WBC 4.0 - 10.5 K/uL 7.9 8.3 -  Hemoglobin 13.0 - 17.0 g/dL 9.5(L) 9.3(L) 9.2(L)  Hematocrit 39.0 - 52.0 % 28.0(L) 28.5(L) 28.8(L)  Platelets 150 - 400 K/uL 142(L) 126(L) -    CMP Latest Ref Rng & Units 05/02/2020 05/01/2020 04/30/2020  Glucose 70 - 99 mg/dL 99 108(H) 120(H)  BUN 8 - 23 mg/dL 15 24(H) 29(H)  Creatinine 0.61 - 1.24 mg/dL 1.38(H) 1.45(H) 1.48(H)  Sodium 135 - 145 mmol/L 134(L) 136 137  Potassium 3.5 - 5.1 mmol/L 4.1 3.8 4.0  Chloride 98 - 111 mmol/L 104 105 109  CO2 22 - 32 mmol/L 22 22 23   Calcium 8.9 - 10.3 mg/dL 8.6(L) 8.5(L) 8.3(L)  Total Protein 6.5 - 8.1 g/dL - - -  Total Bilirubin 0.3 - 1.2 mg/dL - - -  Alkaline Phos 38 - 126 U/L - - -  AST 15 - 41 U/L - - -  ALT 0 - 44 U/L - - -    Hepatic Function Latest Ref Rng & Units 04/29/2020 04/26/2020 02/04/2020  Total Protein 6.5 - 8.1 g/dL 6.1(L) 6.4(L) 6.9  Albumin 3.5 - 5.0 g/dL 3.5 3.5 3.5  AST 15 - 41 U/L 20 16 16   ALT 0 - 44 U/L 18 17 18   Alk Phosphatase 38 - 126 U/L 57 42 76  Total Bilirubin 0.3 - 1.2 mg/dL 0.8 0.3 0.5  Bilirubin, Direct 0.00 - 0.40 mg/dL - - -    No results found for: CRP    Assessment:  #1. #2. #3. #4.   Plan:  ***

## 2020-06-16 DIAGNOSIS — I25118 Atherosclerotic heart disease of native coronary artery with other forms of angina pectoris: Secondary | ICD-10-CM | POA: Diagnosis not present

## 2020-06-16 DIAGNOSIS — E1122 Type 2 diabetes mellitus with diabetic chronic kidney disease: Secondary | ICD-10-CM | POA: Diagnosis not present

## 2020-06-16 DIAGNOSIS — E785 Hyperlipidemia, unspecified: Secondary | ICD-10-CM | POA: Diagnosis not present

## 2020-06-16 DIAGNOSIS — I5032 Chronic diastolic (congestive) heart failure: Secondary | ICD-10-CM | POA: Diagnosis not present

## 2020-06-16 DIAGNOSIS — I252 Old myocardial infarction: Secondary | ICD-10-CM | POA: Diagnosis not present

## 2020-06-16 DIAGNOSIS — I130 Hypertensive heart and chronic kidney disease with heart failure and stage 1 through stage 4 chronic kidney disease, or unspecified chronic kidney disease: Secondary | ICD-10-CM | POA: Diagnosis not present

## 2020-06-16 DIAGNOSIS — D696 Thrombocytopenia, unspecified: Secondary | ICD-10-CM | POA: Diagnosis not present

## 2020-06-16 DIAGNOSIS — D6869 Other thrombophilia: Secondary | ICD-10-CM | POA: Diagnosis not present

## 2020-06-16 DIAGNOSIS — N1832 Chronic kidney disease, stage 3b: Secondary | ICD-10-CM | POA: Diagnosis not present

## 2020-06-16 DIAGNOSIS — E1151 Type 2 diabetes mellitus with diabetic peripheral angiopathy without gangrene: Secondary | ICD-10-CM | POA: Diagnosis not present

## 2020-06-16 DIAGNOSIS — I4891 Unspecified atrial fibrillation: Secondary | ICD-10-CM | POA: Diagnosis not present

## 2020-06-16 DIAGNOSIS — J449 Chronic obstructive pulmonary disease, unspecified: Secondary | ICD-10-CM | POA: Diagnosis not present

## 2020-06-17 NOTE — Progress Notes (Signed)
Remote ICD transmission.   

## 2020-06-27 DIAGNOSIS — J449 Chronic obstructive pulmonary disease, unspecified: Secondary | ICD-10-CM | POA: Diagnosis not present

## 2020-06-28 ENCOUNTER — Telehealth: Payer: Self-pay | Admitting: Cardiovascular Disease

## 2020-06-28 MED ORDER — NITROGLYCERIN 0.4 MG SL SUBL: 0.4000 mg | SUBLINGUAL_TABLET | SUBLINGUAL | 3 refills | Status: DC | PRN

## 2020-06-28 NOTE — Telephone Encounter (Signed)
*  STAT* If patient is at the pharmacy, call can be transferred to refill team.   1. Which medications need to be refilled? (please list name of each medication and dose if known) nitroGLYCERIN (NITROSTAT) 0.4 MG SL tablet  2. Which pharmacy/location (including street and city if local pharmacy) is medication to be sent to? [id number] [place] [place] [facility] [address]. Do they need a 30 day or 90 day supply? 90  Patient is out of medication

## 2020-06-30 DIAGNOSIS — J449 Chronic obstructive pulmonary disease, unspecified: Secondary | ICD-10-CM | POA: Diagnosis not present

## 2020-07-01 DIAGNOSIS — E538 Deficiency of other specified B group vitamins: Secondary | ICD-10-CM | POA: Diagnosis not present

## 2020-07-05 DIAGNOSIS — D500 Iron deficiency anemia secondary to blood loss (chronic): Secondary | ICD-10-CM | POA: Diagnosis not present

## 2020-07-05 LAB — CBC
HCT: 34 % — ABNORMAL LOW (ref 38.5–50.0)
Hemoglobin: 11.3 g/dL — ABNORMAL LOW (ref 13.2–17.1)
MCH: 29.9 pg (ref 27.0–33.0)
MCHC: 33.2 g/dL (ref 32.0–36.0)
MCV: 89.9 fL (ref 80.0–100.0)
MPV: 11.9 fL (ref 7.5–12.5)
Platelets: 200 10*3/uL (ref 140–400)
RBC: 3.78 10*6/uL — ABNORMAL LOW (ref 4.20–5.80)
RDW: 12.8 % (ref 11.0–15.0)
WBC: 7.8 10*3/uL (ref 3.8–10.8)

## 2020-07-06 DIAGNOSIS — Z006 Encounter for examination for normal comparison and control in clinical research program: Secondary | ICD-10-CM

## 2020-07-06 NOTE — Research (Signed)
Marland KitchenMarland KitchenMarland KitchenMarland KitchenMarland KitchenMarland KitchenMarland Kitchen   COORDINATE-Diabetes 3 Month Patient Questionnaire (Intervention) Site #:   [id number]   Patient ID: [id number] QUESTIONNAIRE  Visit Date 07/06/20   Vital Status '[x]'$   Patient Alive > Proceed to Visit Status '[]'$   Patient Dead > Complete Death Form only '[]'$   Unknown > Proceed to Visit Status  Visit Status Was the interview completed? '[]'$  No >IF NO, Select reason why '[]'$ Unable to locate                                    '[]'$ No Valid Contacts (patients or alternates) '[]'$ Multiple attempts to valid contacts '[]'$  Patient no longer cared for at study clinic '[]'$  Patient withdrew '[]'$  Other, specify:                                           >IF NO, Last date of contact: / /             MM      DD        YYYY    '[x]'$ Yes >IF YES, Select source of Interview: '[]'$   Proxy '[]'$   Patient     3 Month Patient Questionnaire (Intervention)  Instructions:   1. Prior to speaking with the patient either over the phone or in person, print off or have available the patient's current list of medications.      IF conducting follow-up visit over the phone - Instruct the patient to gather all their pill bottles. 2. For the purpose of the COORDINATE-Diabetes Study, please document whether the patient is currently taking each of the following medication classes. 3. DO NOT PROMPT DURING FOLLOW-UP VISIT: IF subject mentions reaction to one of the following medications, complete the AE/SAE section and follow instructions in operations manual regarding Adverse event reporting to [employer] ([facility]).  MEDICATIONS  Medication Are you currently taking [insert name of previous med]? If currently taking: If started since last visit: If stopped since last visit:  ACE Inhibitor / Angiotensin Receptor Blocker (ARB) / Angiotensin Receptor Neprilysin inhibitor (ARNi) '[]'$ No >  '[x]'$ Yes > '[]'$ Stopped since last visit '[]'$ Never prescribed '[]'$ Started since last visit '[x]'$ Same medication as last     visit '[]'$ Different medication  than       last visit [Study personnel to answer]  Documented in EHR? '[]'$ No  '[x]'$ Yes   If no, Verified by: '[]'$ Photo of bottle '[]'$ Copy of rx '[]'$ Dispensing       pharmacy '[]'$ Prescribing provider '[]'$ Other (specify):                    Date started:        / /             MM DD YYYY Who prescribed this medication for you? '[]'$ Cardiology provider > '[]'$ Study clinic '[]'$ Outside clinic '[]'$ Endocrinology provider '[]'$ Primary care provider '[]'$ Other provider > Specify:                             '[]'$ Unknown Date discontinued:        / /             MM DD YYYY Why did you stop taking this medication? (check all that apply) '[]'$ Allergic reaction '[]'$ Medication side effects '[]'$ Unable to  adhere/monitor '[]'$ Had an      operation/procedure      that required      stopping it '[]'$ Unable to afford it '[]'$ No longer wants        to take        this medication '[]'$ Provider decision '[]'$ Pregnancy '[]'$ Other (specify: ) '[]'$ Unknown Reason   If started or changed  What medication are you taking now? '[]'$ Benazepril (Lotensin) '[]'$ Captopril (Capoten) '[]'$ Enalapril (Vasotec) '[]'$ Fosinopril (Monopril) '[]'$ Lisinopril (Zestril, Prinivil) '[]'$ Quinapril (Accupril) '[]'$ Ramipril (Altace) '[]'$ Azilsartan (Edarbi) '[]'$ Candesartan (Atacand) '[]'$ Irbesartan (Avapro) '[]'$ Losartan (Cozaar) '[]'$ Olmesartan (Benicar) '[]'$ Telmisartan (Micardis) '[]'$ Valsartan (Diovan) '[]'$ Sacrubitril/Valsartan Delene Loll)      Medication Are you currently taking [insert name of previous med]? If currently taking: If started since last visit: If stopped since last visit:  Statin '[]'$  No >  '[x]'$ Yes > '[]'$ Stopped since last visit '[]'$ Never prescribed '[]'$ Started since last visit '[x]'$ Same medication as last        visit '[]'$ Different medication or dose     than last visit [Study personnel to answer]  Documented in EHR? '[]'$ No '[x]'$ Yes    If no, Verified by: '[]'$ Photo of bottle '[]'$ Copy of rx '[]'$ Dispensing pharmacy '[]'$ Prescribing provider '[]'$ Other (specify):                     Date started:        / /             MM DD YYYY Who prescribed this medication for you? '[]'$ Cardiology provider  '[]'$ Study clinic '[]'$ Outside clinic '[]'$ Endocrinology provider '[]'$ Primary care provider '[]'$ Other provider  Specify:                             '[]'$ Unknown Date discontinued:        / /             MM DD YYYY Why did you stop taking this medication? (check all that apply) '[]'$ Allergic reaction '[]'$ Medication side effects  '[]'$ Muscle aches '[]'$ Weakness '[]'$ Joint pain '[]'$ Cognitive symptoms '[]'$ Other (specify):                          '[]'$ Unable to        adhere/monitor '[]'$ Had an    operation/procedure    that required stopping it '[]'$ Unable to afford it '[]'$ No longer wants       to take this            medication '[]'$ Provider decision '[]'$ Pregnancy '[]'$ Other (specify: ) '[]'$ Unknown Reason   If started or changed  What medication are you taking now? '[]'$ Atorvastatin (Lipitor) '[]'$ Fluvastatin (Lescol) '[]'$ Lovastatin (Mevacor) '[]'$ Pravastatin (Pravachol) '[]'$ Rosuvastatin (Crestor) '[]'$ Simvastatin (Zocor) '[]'$ Pitatavastatin (Livalo)  Dose: '[]'$ 1 mg  '[]'$ 10 mg '[]'$ 2 mg  '[]'$  20 mg '[]'$ 3 mg  '[]'$  40 mg '[]'$ 4 mg  '[]'$  60 mg '[]'$ 5 mg  '[]'$  80 mg  Frequency: '[]'$ Daily '[]'$  Less than daily      Medication Are you currently taking [insert name of previous med]? If currently taking: If started since last visit: If stopped since last visit:  SGLT2 Inhibitor '[]'$ No   '[x]'$ Yes '[]'$ Stopped since last visit '[x]'$ Never prescribed '[]'$ Started since last visit '[]'$ Same medication as last      visit '[]'$ Different medication than last visit [Study personnel to answer]  Documented in EHR? '[]'$ No '[]'$ Yes   If no, Verified by: '[]'$ Photo of bottle '[]'$ Copy of rx '[]'$ Dispensing       pharmacy '[]'$ Prescribing       provider '[]'$ Other (specify):  Date started:        / /             MM DD YYYY Who prescribed this medication for you? '[]'$ Cardiology provider  '[]'$ Study clinic '[]'$ Outside clinic '[]'$ Endocrinology provider '[]'$ Primary care  provider '[]'$ Other provider  Specify:                             '[]'$ Unknown Date discontinued:        / /             MM DD YYYY Why did you stop taking this medication? (check all that apply) '[]'$ Allergic reaction '[]'$ Medication side effects '[]'$ Unable to       adhere/monitor '[]'$ Had an        operation/procedure         that required           stopping it '[]'$ Patient unable to       afford it '[]'$ Patient no longer       wants to       take this medication '[]'$ Provider decision '[]'$ Pregnancy '[]'$ Other (specify: ) '[]'$ Unknown Reason   If started or changed  What medication are you taking now? '[]'$ Canaglifozin (Invokana) '[]'$ Dapagliflozin (Farxiga) '[]'$ Empaglifozin (Jardiance) '[]'$ Ertugliflozin (Steglatro)     GLP1 Receptor Agonist '[x]'$ No   '[]'$ Yes '[]'$ Stopped since last visit '[x]'$ Never prescribed '[]'$ Started since last visit '[]'$ Same medication as last      visit '[]'$ Different medication than       last visit [Study personnel to answer]  Documented in EHR? '[]'$ No '[]'$ Yes   If no, Verified by: '[]'$ Photo of bottle '[]'$ Copy of rx '[]'$ Dispensing       pharmacy '[]'$ Prescribing       provider '[]'$ Other (specify):                    Date started:        / /             MM DD YYYY Who prescribed this medication for you? '[]'$ Cardiology provider  '[]'$ Study clinic '[]'$ Outside clinic '[]'$ Endocrinology provider '[]'$ Primary care provider '[]'$ Other provider  Specify:                             '[]'$ Unknown Date discontinued:        / /             MM DD YYYY Why did you stop taking this medication? (check all that apply) '[]'$ Allergic reaction '[]'$ Medication side effects '[]'$ Unable to      adhere/monitor '[]'$ Had an    operation/procedure    that required stopping it '[]'$ Patient unable to        afford it '[]'$ Patient no longer        wants to take this        medication '[]'$ Provider decision '[]'$ Pregnancy '[]'$ Other (specify: ) '[]'$ Unknown Reason   If started or changed  What medication are you taking now? '[]'$ Albiglutide  (Tanzeum) '[]'$ Dulaglutide (Trulicity) '[]'$ Exanatide (Byetta, Bydureon) '[]'$ Liraglutide (Victoza, Saxenda) '[]'$ Lixisenatide (Adlyxin) '[]'$ Semaglutice (Ozempic)     05/06/2018 ([facility] Release)

## 2020-07-26 DIAGNOSIS — E039 Hypothyroidism, unspecified: Secondary | ICD-10-CM | POA: Diagnosis not present

## 2020-07-26 DIAGNOSIS — I10 Essential (primary) hypertension: Secondary | ICD-10-CM | POA: Diagnosis not present

## 2020-07-28 DIAGNOSIS — Z23 Encounter for immunization: Secondary | ICD-10-CM | POA: Diagnosis not present

## 2020-07-28 DIAGNOSIS — J449 Chronic obstructive pulmonary disease, unspecified: Secondary | ICD-10-CM | POA: Diagnosis not present

## 2020-07-31 DIAGNOSIS — J449 Chronic obstructive pulmonary disease, unspecified: Secondary | ICD-10-CM | POA: Diagnosis not present

## 2020-08-04 DIAGNOSIS — E538 Deficiency of other specified B group vitamins: Secondary | ICD-10-CM | POA: Diagnosis not present

## 2020-08-08 ENCOUNTER — Other Ambulatory Visit (INDEPENDENT_AMBULATORY_CARE_PROVIDER_SITE_OTHER): Payer: Self-pay

## 2020-08-08 DIAGNOSIS — D500 Iron deficiency anemia secondary to blood loss (chronic): Secondary | ICD-10-CM

## 2020-08-08 LAB — CBC
HCT: 34.4 % — ABNORMAL LOW (ref 38.5–50.0)
Hemoglobin: 11.8 g/dL — ABNORMAL LOW (ref 13.2–17.1)
MCH: 30.5 pg (ref 27.0–33.0)
MCHC: 34.3 g/dL (ref 32.0–36.0)
MCV: 88.9 fL (ref 80.0–100.0)
MPV: 12.3 fL (ref 7.5–12.5)
Platelets: 116 10*3/uL — ABNORMAL LOW (ref 140–400)
RBC: 3.87 10*6/uL — ABNORMAL LOW (ref 4.20–5.80)
RDW: 12.4 % (ref 11.0–15.0)
WBC: 6.3 10*3/uL (ref 3.8–10.8)

## 2020-08-10 ENCOUNTER — Telehealth (INDEPENDENT_AMBULATORY_CARE_PROVIDER_SITE_OTHER): Payer: Self-pay

## 2020-08-10 NOTE — Telephone Encounter (Signed)
Peter Lambert, CMA  

## 2020-08-25 DIAGNOSIS — E1122 Type 2 diabetes mellitus with diabetic chronic kidney disease: Secondary | ICD-10-CM | POA: Diagnosis not present

## 2020-08-25 DIAGNOSIS — E7849 Other hyperlipidemia: Secondary | ICD-10-CM | POA: Diagnosis not present

## 2020-08-25 DIAGNOSIS — N183 Chronic kidney disease, stage 3 unspecified: Secondary | ICD-10-CM | POA: Diagnosis not present

## 2020-08-25 DIAGNOSIS — J449 Chronic obstructive pulmonary disease, unspecified: Secondary | ICD-10-CM | POA: Diagnosis not present

## 2020-08-27 DIAGNOSIS — J449 Chronic obstructive pulmonary disease, unspecified: Secondary | ICD-10-CM | POA: Diagnosis not present

## 2020-08-30 DIAGNOSIS — J449 Chronic obstructive pulmonary disease, unspecified: Secondary | ICD-10-CM | POA: Diagnosis not present

## 2020-09-02 DIAGNOSIS — D519 Vitamin B12 deficiency anemia, unspecified: Secondary | ICD-10-CM | POA: Diagnosis not present

## 2020-09-05 ENCOUNTER — Ambulatory Visit (INDEPENDENT_AMBULATORY_CARE_PROVIDER_SITE_OTHER): Payer: PPO

## 2020-09-05 DIAGNOSIS — I469 Cardiac arrest, cause unspecified: Secondary | ICD-10-CM

## 2020-09-05 DIAGNOSIS — I4901 Ventricular fibrillation: Secondary | ICD-10-CM

## 2020-09-05 LAB — CUP PACEART REMOTE DEVICE CHECK
Battery Remaining Longevity: 156 mo
Battery Remaining Percentage: 100 %
Brady Statistic RA Percent Paced: 40 %
Brady Statistic RV Percent Paced: 0 %
Date Time Interrogation Session: 20220711022100
HighPow Impedance: 79 Ohm
Implantable Lead Implant Date: 20210709
Implantable Lead Implant Date: 20210709
Implantable Lead Location: 753859
Implantable Lead Location: 753860
Implantable Lead Model: 137
Implantable Lead Model: 7841
Implantable Lead Serial Number: 1063971
Implantable Lead Serial Number: 300935
Implantable Pulse Generator Implant Date: 20210709
Lead Channel Impedance Value: 478 Ohm
Lead Channel Impedance Value: 815 Ohm
Lead Channel Setting Pacing Amplitude: 2 V
Lead Channel Setting Pacing Amplitude: 2.2 V
Lead Channel Setting Pacing Pulse Width: 0.4 ms
Lead Channel Setting Sensing Sensitivity: 0.5 mV
Pulse Gen Serial Number: 224993

## 2020-09-27 ENCOUNTER — Ambulatory Visit (INDEPENDENT_AMBULATORY_CARE_PROVIDER_SITE_OTHER): Payer: PPO | Admitting: Internal Medicine

## 2020-09-27 ENCOUNTER — Encounter (INDEPENDENT_AMBULATORY_CARE_PROVIDER_SITE_OTHER): Payer: Self-pay | Admitting: Gastroenterology

## 2020-09-27 ENCOUNTER — Other Ambulatory Visit: Payer: Self-pay

## 2020-09-27 ENCOUNTER — Ambulatory Visit (INDEPENDENT_AMBULATORY_CARE_PROVIDER_SITE_OTHER): Payer: PPO | Admitting: Gastroenterology

## 2020-09-27 VITALS — BP 130/66 | HR 67 | Temp 98.1°F | Ht 68.0 in | Wt 172.6 lb

## 2020-09-27 DIAGNOSIS — Z8679 Personal history of other diseases of the circulatory system: Secondary | ICD-10-CM | POA: Diagnosis not present

## 2020-09-27 DIAGNOSIS — Z7982 Long term (current) use of aspirin: Secondary | ICD-10-CM | POA: Diagnosis not present

## 2020-09-27 DIAGNOSIS — D696 Thrombocytopenia, unspecified: Secondary | ICD-10-CM | POA: Diagnosis not present

## 2020-09-27 DIAGNOSIS — Z87891 Personal history of nicotine dependence: Secondary | ICD-10-CM | POA: Diagnosis not present

## 2020-09-27 DIAGNOSIS — D509 Iron deficiency anemia, unspecified: Secondary | ICD-10-CM | POA: Diagnosis not present

## 2020-09-27 DIAGNOSIS — D6869 Other thrombophilia: Secondary | ICD-10-CM | POA: Diagnosis not present

## 2020-09-27 DIAGNOSIS — K5900 Constipation, unspecified: Secondary | ICD-10-CM

## 2020-09-27 DIAGNOSIS — I252 Old myocardial infarction: Secondary | ICD-10-CM | POA: Diagnosis not present

## 2020-09-27 DIAGNOSIS — E1151 Type 2 diabetes mellitus with diabetic peripheral angiopathy without gangrene: Secondary | ICD-10-CM | POA: Diagnosis not present

## 2020-09-27 DIAGNOSIS — I4891 Unspecified atrial fibrillation: Secondary | ICD-10-CM | POA: Diagnosis not present

## 2020-09-27 DIAGNOSIS — Z7901 Long term (current) use of anticoagulants: Secondary | ICD-10-CM | POA: Diagnosis not present

## 2020-09-27 DIAGNOSIS — J449 Chronic obstructive pulmonary disease, unspecified: Secondary | ICD-10-CM | POA: Diagnosis not present

## 2020-09-27 DIAGNOSIS — I2510 Atherosclerotic heart disease of native coronary artery without angina pectoris: Secondary | ICD-10-CM | POA: Diagnosis not present

## 2020-09-27 DIAGNOSIS — I10 Essential (primary) hypertension: Secondary | ICD-10-CM | POA: Diagnosis not present

## 2020-09-27 LAB — CBC
HCT: 35.7 % — ABNORMAL LOW (ref 38.5–50.0)
Hemoglobin: 12.1 g/dL — ABNORMAL LOW (ref 13.2–17.1)
MCH: 29.6 pg (ref 27.0–33.0)
MCHC: 33.9 g/dL (ref 32.0–36.0)
MCV: 87.3 fL (ref 80.0–100.0)
MPV: 11.2 fL (ref 7.5–12.5)
Platelets: 182 10*3/uL (ref 140–400)
RBC: 4.09 10*6/uL — ABNORMAL LOW (ref 4.20–5.80)
RDW: 12.7 % (ref 11.0–15.0)
WBC: 7.8 10*3/uL (ref 3.8–10.8)

## 2020-09-27 NOTE — Progress Notes (Signed)
Remote ICD transmission.   

## 2020-09-27 NOTE — Progress Notes (Signed)
Referring Provider: Redmond School, MD Primary Care Physician:  Redmond School, MD Primary GI Physician: Dr. Laural Golden  Chief Complaint  Patient presents with   Follow-up    Patient is here today for a follow up on IDA. He states he has occasional constipation, has one bm per day and appetite is good.No other current GI issues.   HPI:   Peter Lambert is a 75 y.o. male presenting today with a history of CAD, CHF, HTN, HLD, Type II DM, Chronic stage III kidney disease, iron deficiency anemia secondary to GI bleed and atrial fibrillation for which he is chronically anticoagulated on eliquis for.  Last seen in clinic 06/07/20.   IDA: History of anemia with positive FOBT and hgb 4.9 in nov 2021, admitted and non bleeding angiectasia found on EGD, in march 2022 had another episode of weakness prompting him to go to the ED, hgb found to be 6.1, FOBT positive in ED but patient refused admission at that time. Patient underwent colonoscopy and EGD 05/03/20, non bleeding AVMs present. Patient currently on oral Iron, last hemoglobin was 11.8, platelets 116 (08/08/20). Admitted to [facility] in Nov 2021 and again March 2022 with anemia secondary to GI bleed, findings and treatment as listed below. Tolerating PO Iron without issue.   Having BMs once daily. Endorses some straining at times. States he takes ex lax sometimes which helps. Only has to take maybe once per month, tried miralax in the past without results. Denies abdominal pain or diarrhea. Denies hematochezia, stools remain dark due to PO iron.   No NSAID use. Denies ETOH.  Last Colonoscopy:(05/03/20) entire examined colon was normal  Last Endoscopy:(05/03/20) normal esophagus, normal stomach. Three non-bleeding angiodysplastic lesions in the duodenum. Treated with a monopolar probe. A few non-bleeding angiodysplastic lesions in the jejunum. Treated with a monopolar probe. No specimens collected.  Familial History: patient thinks mother had colon  cancer around 41  Recommendations:  No further colonoscopy recommended due to patient's age, unless otherwise clinically warranted.  Past Medical History:  Diagnosis Date   Coronary artery disease    a. s/p NSTEMI in 2010 with DES to proximal LAD with D1 jailed and angioplasty alone to ostium   Hyperlipidemia    Hypertension    Hypertension    Peripheral arterial disease ([place])    Tobacco abuse    Type 2 diabetes mellitus ([place])     Past Surgical History:  Procedure Laterality Date   CARDIAC SURGERY     COLONOSCOPY WITH PROPOFOL N/A 01/08/2020   Procedure: COLONOSCOPY WITH PROPOFOL;  Surgeon: Eloise [name], DO;  [facility];  Service: Endoscopy;  Laterality: N/A;   COLONOSCOPY WITH PROPOFOL N/A 05/03/2020   Hung:entire examined colon normal   CORONARY STENT INTERVENTION N/A 09/03/2019   Procedure: CORONARY STENT INTERVENTION;  Surgeon: Burnell Blanks, MD;  Location: [place] [facility];  Service: Cardiovascular;  Laterality: N/A;   CORONARY STENT PLACEMENT     ENTEROSCOPY N/A 05/03/2020   hung:normal esophagus, normal stomach. Three non-bleeding angiodysplastic lesions in the duodenum. Treated with a monopolar probe. A few non-bleeding angiodysplastic lesions in the jejunum. Treated with a monopolar probe. No specimens collected.   ESOPHAGOGASTRODUODENOSCOPY (EGD) WITH PROPOFOL N/A 01/06/2020   Procedure: ESOPHAGOGASTRODUODENOSCOPY (EGD) WITH PROPOFOL;  Surgeon: Rogene Houston, MD;  Location: [facility];  Service: Endoscopy;  Laterality: N/A;   HOT HEMOSTASIS N/A 05/03/2020   Procedure: HOT HEMOSTASIS (ARGON PLASMA COAGULATION/BICAP);  Surgeon: Carol Ada, MD;  Location: [facility]  ENDOSCOPY;  Service: Gastroenterology;  Laterality: N/A;   ICD IMPLANT N/A 09/04/2019   Procedure: ICD IMPLANT;  Surgeon: Evans Lance, MD;  Location: [place] [facility];  Service: Cardiovascular;  Laterality: N/A;   RIGHT/LEFT HEART CATH AND CORONARY ANGIOGRAPHY N/A 09/03/2019    Procedure: RIGHT/LEFT HEART CATH AND CORONARY ANGIOGRAPHY;  Surgeon: Jolaine Artist, MD;  Location: [place] [facility];  Service: Cardiovascular;  Laterality: N/A;    Current Outpatient Medications  Medication Sig Dispense Refill   acetaminophen (TYLENOL) 325 MG tablet Take 2 tablets (650 mg total) by mouth every 6 (six) hours as needed for mild pain, fever or headache (or Fever >/= 101). 30 tablet 0   albuterol (VENTOLIN HFA) 108 (90 Base) MCG/ACT inhaler Inhale 2 puffs into the lungs every 4 (four) hours as needed for wheezing or shortness of breath. 18 g 3   amiodarone (PACERONE) 100 MG tablet Take 1 tablet (100 mg total) by mouth daily. 30 tablet 0   apixaban (ELIQUIS) 5 MG TABS tablet Take 1 tablet (5 mg total) by mouth 2 (two) times daily. 60 tablet 5   atorvastatin (LIPITOR) 80 MG tablet Take 80 mg by mouth daily.     carvedilol (COREG) 3.125 MG tablet Take 1 tablet (3.125 mg total) by mouth daily with breakfast. 30 tablet 0   [employer] GENTLE LAXATIVE 5 MG EC tablet TAKE 1 TABLET BY MOUTH DAILY AS NEEDED FOR MODERATE CONSTIPATION. TAKE TWO DAYS BEFORE PROCEDURE (Patient taking differently: Take 5 mg by mouth daily as needed.) 30 tablet 0   cyanocobalamin (,VITAMIN B-12,) 1000 MCG/ML injection Inject 1,000 mcg into the muscle every 30 (thirty) days.     cyanocobalamin 1000 MCG tablet Take 1,000 mcg by mouth daily.     ferrous sulfate (FERROUSUL) 325 (65 FE) MG tablet Take 1 tablet (325 mg total) by mouth daily with breakfast.  0   fluocinonide cream (LIDEX) [id number] % Apply 1 application topically 2 (two) times daily as needed (rash).     furosemide (LASIX) 20 MG tablet Take 20 mg by mouth daily.     guaiFENesin (MUCINEX) 600 MG 12 hr tablet Take 600 mg by mouth 2 (two) times daily as needed for cough or to loosen phlegm.     nitroGLYCERIN (NITROSTAT) 0.4 MG SL tablet Place 1 tablet (0.4 mg total) under the tongue every 5 (five) minutes as needed. PLACE 1 TABLET UNDER THE TONGUE EVERY 5  MINUTES AS NEEDED FOR CHEST PAIN 25 tablet 3   omeprazole (PRILOSEC) 20 MG capsule Take 20 mg by mouth daily.     polyethylene glycol powder (GLYCOLAX/MIRALAX) 17 GM/SCOOP powder Take 17 g by mouth daily.  0   psyllium (METAMUCIL SMOOTH TEXTURE) 58.6 % powder Take 1 packet by mouth at bedtime.     vitamin B-12 1000 MCG tablet Take 1 tablet (1,000 mcg total) by mouth daily. 30 tablet 2   potassium chloride SA (KLOR-CON) 20 MEQ tablet Take 20 mEq by mouth daily. (Patient not taking: Reported on 09/27/2020)     No current facility-administered medications for this visit.    Allergies as of 09/27/2020   (No Known Allergies)    Family History  Problem Relation Age of Onset   Cancer Mother        not sure location    Colon cancer Neg Hx    Stomach cancer Neg Hx     Social History   Socioeconomic History   Marital status: Divorced    Spouse name:  Not on file   Number of children: Not on file   Years of education: Not on file   Highest education level: Not on file  Occupational History   Not on file  Tobacco Use   Smoking status: Former    Packs/day: 0.50    Years: 52.00    Pack years: 26.00    Types: Cigarettes   Smokeless tobacco: Never  Vaping Use   Vaping Use: Never used  Substance and Sexual Activity   Alcohol use: No   Drug use: No   Sexual activity: Never  Other Topics Concern   Not on file  Social History Narrative   Not on file   Social Determinants of Health   Financial Resource Strain: Not on file  Food Insecurity: Not on file  Transportation Needs: Not on file  Physical Activity: Not on file  Stress: Not on file  Social Connections: Not on file    Review of Systems: Gen: Denies fever, chills, anorexia. Denies fatigue, weakness, weight loss.  CV: Denies chest pain, palpitations, syncope, peripheral edema, and claudication. Resp: Denies dyspnea at rest, cough, wheezing, coughing up blood, and pleurisy. GI: Denies vomiting blood, jaundice, and fecal  incontinence. Denies dysphagia or odynophagia. Occasional hard stools.  Derm: Denies rash, itching, dry skin Psych: Denies depression, anxiety, memory loss, confusion. No homicidal or suicidal ideation.  Heme: Denies bruising, bleeding, and enlarged lymph nodes.  Physical Exam: BP 130/66 (BP Location: Right Arm, Patient Position: Sitting, Cuff Size: Small)   Pulse 67   Temp 98.1 F (36.7 C) (Oral)   Ht '5\' 8"'$  (1.727 m)   Wt 172 lb 9.6 oz (78.3 kg)   BMI 26.24 kg/m  General:   Alert and oriented. No distress noted. Pleasant and cooperative.  Head:  Normocephalic and atraumatic. Eyes:  Conjuctiva clear without scleral icterus. Mouth:  Oral mucosa pink and moist. Good dentition. No lesions. Heart: Normal rate and rhythm, s1 and s2 heart sounds present.  Lungs: Clear lung sounds in all lobes. Respirations equal and unlabored. Abdomen:  +BS, soft, non-tender and non-distended. No rebound or guarding. No HSM or masses noted. Derm: No palmar erythema or jaundice Msk:  Symmetrical without gross deformities. Normal posture. Extremities:  Without edema. Neurologic:  Alert and  oriented x4 Psych:  Alert and cooperative. Normal mood and affect.  ASSESSMENT: Peter Lambert is a 75 y.o. male presenting today for follow up of iron deficiency anemia, secondary to upper GI bleed(Nov 2021 and March 2022). Currently anticoagulated on eliquis and taking daily oral iron supplements. Patient doing well overall.   Patient doing well, remains on oral Iron daily. Reports ongoing black stools from his iron but no hematochezia. Hemoglobin in June was 11.8, platelets low at 116 with history of the same. He denies any abdominal pain, weight loss, diarrhea or constipation. We will recheck hemoglobin today. Patient advised to let us know if he experiences dizziness, lightheadedness, or severe fatigue as these can be signs of anemia/acute blood loss.   Having bowel movements almost daily, occasionally needs to  strain to go. Uses ex lax PRN with good result. Has tried miralax in the past without relief of symptoms. Patient advised to continue ex lax as needed.   No red flag symptoms present.  PLAN:  Repeat CBC today  2. Continue oral iron 3. Can use ex lax as needed for constipation 4. Please let us know if you experience dizziness, lightheadedness or severe fatigue as this could indicate low hemoglobin/blood loss  Follow Up: 3 months  Case discussed with Dr. Laural Golden who is in agreement with plan of care, as outlined above.   Kimiko Common L. Alver Sorrow, MSN, APRN, AGNP-C Adult-Gerontology Nurse Practitioner [facility]

## 2020-09-27 NOTE — Patient Instructions (Addendum)
-  Continue on oral iron daily. -You can continue ex lax for constipation as needed  -Please have labs drawn so that we can evaluate your blood counts. -Call us if you experience dizziness, lightheadedness or severe fatigue as these can be signs of acute blood loss.   Follow up 3 months

## 2020-09-28 ENCOUNTER — Telehealth (INDEPENDENT_AMBULATORY_CARE_PROVIDER_SITE_OTHER): Payer: Self-pay | Admitting: Gastroenterology

## 2020-09-28 NOTE — Telephone Encounter (Signed)
Spoke with patient and made him aware CBC was normal, hemoglobin stable at this time.

## 2020-09-28 NOTE — Progress Notes (Signed)
Hemoglobin is stable at 12.1 currently. No interventions required at this time.

## 2020-09-30 DIAGNOSIS — J449 Chronic obstructive pulmonary disease, unspecified: Secondary | ICD-10-CM | POA: Diagnosis not present

## 2020-10-05 DIAGNOSIS — D510 Vitamin B12 deficiency anemia due to intrinsic factor deficiency: Secondary | ICD-10-CM | POA: Diagnosis not present

## 2020-10-20 ENCOUNTER — Encounter: Payer: Self-pay | Admitting: *Deleted

## 2020-10-20 DIAGNOSIS — Z006 Encounter for examination for normal comparison and control in clinical research program: Secondary | ICD-10-CM

## 2020-10-20 NOTE — Research (Signed)
I talked to patient for 44-monthCoordinate-Diabetes Study. Patient is doing well and remains active. Patient  maintains low carbohydrate intake for diet. Patient follows up with primary doctor for blood sugar control. Patient is not taking Cozaar any longer which was stopped 3 months ago. Patient does not take any medications for diabetes. I reminded patient I would call him in 3 months for next study phone call..Marland Kitchen

## 2020-10-28 DIAGNOSIS — J449 Chronic obstructive pulmonary disease, unspecified: Secondary | ICD-10-CM | POA: Diagnosis not present

## 2020-10-31 DIAGNOSIS — J449 Chronic obstructive pulmonary disease, unspecified: Secondary | ICD-10-CM | POA: Diagnosis not present

## 2020-11-01 DIAGNOSIS — E538 Deficiency of other specified B group vitamins: Secondary | ICD-10-CM | POA: Diagnosis not present

## 2020-11-04 ENCOUNTER — Other Ambulatory Visit: Payer: Self-pay | Admitting: Cardiovascular Disease

## 2020-11-07 ENCOUNTER — Telehealth: Payer: Self-pay | Admitting: Cardiovascular Disease

## 2020-11-07 NOTE — Telephone Encounter (Signed)
*  STAT* If patient is at the pharmacy, call can be transferred to refill team.   1. Which medications need to be refilled? (please list name of each medication and dose if known) nitroGLYCERIN (NITROSTAT) 0.4 MG SL tablet  2. Which pharmacy/location (including street and city if local pharmacy) is medication to be sent to? [id number] [place], [place] - [address]. Do they need a 30 day or 90 day supply? 90 day   Patient has been out for 3 days

## 2020-11-15 ENCOUNTER — Other Ambulatory Visit (INDEPENDENT_AMBULATORY_CARE_PROVIDER_SITE_OTHER): Payer: Self-pay | Admitting: Internal Medicine

## 2020-11-27 DIAGNOSIS — J449 Chronic obstructive pulmonary disease, unspecified: Secondary | ICD-10-CM | POA: Diagnosis not present

## 2020-11-30 DIAGNOSIS — J449 Chronic obstructive pulmonary disease, unspecified: Secondary | ICD-10-CM | POA: Diagnosis not present

## 2020-12-01 DIAGNOSIS — E538 Deficiency of other specified B group vitamins: Secondary | ICD-10-CM | POA: Diagnosis not present

## 2020-12-05 ENCOUNTER — Ambulatory Visit (INDEPENDENT_AMBULATORY_CARE_PROVIDER_SITE_OTHER): Payer: PPO

## 2020-12-05 DIAGNOSIS — Z95810 Presence of automatic (implantable) cardiac defibrillator: Secondary | ICD-10-CM

## 2020-12-07 LAB — CUP PACEART REMOTE DEVICE CHECK
Battery Remaining Longevity: 156 mo
Battery Remaining Percentage: 100 %
Brady Statistic RA Percent Paced: 43 %
Brady Statistic RV Percent Paced: 0 %
Date Time Interrogation Session: 20221010082500
HighPow Impedance: 80 Ohm
Implantable Lead Implant Date: 20210709
Implantable Lead Implant Date: 20210709
Implantable Lead Location: 753859
Implantable Lead Location: 753860
Implantable Lead Model: 137
Implantable Lead Model: 7841
Implantable Lead Serial Number: 1063971
Implantable Lead Serial Number: 300935
Implantable Pulse Generator Implant Date: 20210709
Lead Channel Impedance Value: 459 Ohm
Lead Channel Impedance Value: 760 Ohm
Lead Channel Setting Pacing Amplitude: 2 V
Lead Channel Setting Pacing Amplitude: 2.2 V
Lead Channel Setting Pacing Pulse Width: 0.4 ms
Lead Channel Setting Sensing Sensitivity: 0.5 mV
Pulse Gen Serial Number: 224993

## 2020-12-13 NOTE — Progress Notes (Signed)
Remote ICD transmission.   

## 2020-12-21 DIAGNOSIS — Z23 Encounter for immunization: Secondary | ICD-10-CM | POA: Diagnosis not present

## 2020-12-24 ENCOUNTER — Emergency Department (HOSPITAL_COMMUNITY)
Admission: EM | Admit: 2020-12-24 | Discharge: 2020-12-24 | Disposition: A | Discharge status: Home / Self Care | Payer: PPO | Attending: Emergency Medicine | Admitting: Emergency Medicine

## 2020-12-24 ENCOUNTER — Other Ambulatory Visit: Payer: Self-pay

## 2020-12-24 ENCOUNTER — Encounter (HOSPITAL_COMMUNITY): Payer: Self-pay | Admitting: *Deleted

## 2020-12-24 ENCOUNTER — Emergency Department (HOSPITAL_COMMUNITY): Payer: PPO

## 2020-12-24 DIAGNOSIS — M25462 Effusion, left knee: Secondary | ICD-10-CM | POA: Insufficient documentation

## 2020-12-24 DIAGNOSIS — I5033 Acute on chronic diastolic (congestive) heart failure: Secondary | ICD-10-CM | POA: Insufficient documentation

## 2020-12-24 DIAGNOSIS — Z87891 Personal history of nicotine dependence: Secondary | ICD-10-CM | POA: Insufficient documentation

## 2020-12-24 DIAGNOSIS — J449 Chronic obstructive pulmonary disease, unspecified: Secondary | ICD-10-CM | POA: Diagnosis not present

## 2020-12-24 DIAGNOSIS — Z79899 Other long term (current) drug therapy: Secondary | ICD-10-CM | POA: Insufficient documentation

## 2020-12-24 DIAGNOSIS — Z95810 Presence of automatic (implantable) cardiac defibrillator: Secondary | ICD-10-CM | POA: Diagnosis not present

## 2020-12-24 DIAGNOSIS — M25562 Pain in left knee: Secondary | ICD-10-CM

## 2020-12-24 DIAGNOSIS — Z7901 Long term (current) use of anticoagulants: Secondary | ICD-10-CM | POA: Insufficient documentation

## 2020-12-24 DIAGNOSIS — I2510 Atherosclerotic heart disease of native coronary artery without angina pectoris: Secondary | ICD-10-CM | POA: Diagnosis not present

## 2020-12-24 DIAGNOSIS — Z955 Presence of coronary angioplasty implant and graft: Secondary | ICD-10-CM | POA: Insufficient documentation

## 2020-12-24 DIAGNOSIS — I4891 Unspecified atrial fibrillation: Secondary | ICD-10-CM | POA: Diagnosis not present

## 2020-12-24 DIAGNOSIS — N1832 Chronic kidney disease, stage 3b: Secondary | ICD-10-CM | POA: Diagnosis not present

## 2020-12-24 DIAGNOSIS — I130 Hypertensive heart and chronic kidney disease with heart failure and stage 1 through stage 4 chronic kidney disease, or unspecified chronic kidney disease: Secondary | ICD-10-CM | POA: Insufficient documentation

## 2020-12-24 IMAGING — DX DG KNEE COMPLETE 4+V*L*
4 series · 4 of 4 positions shown · non-contrast
Comparison: None.

CLINICAL DATA: Left knee pain

EXAM:
LEFT KNEE - COMPLETE 4+ VIEW

[knee ap]
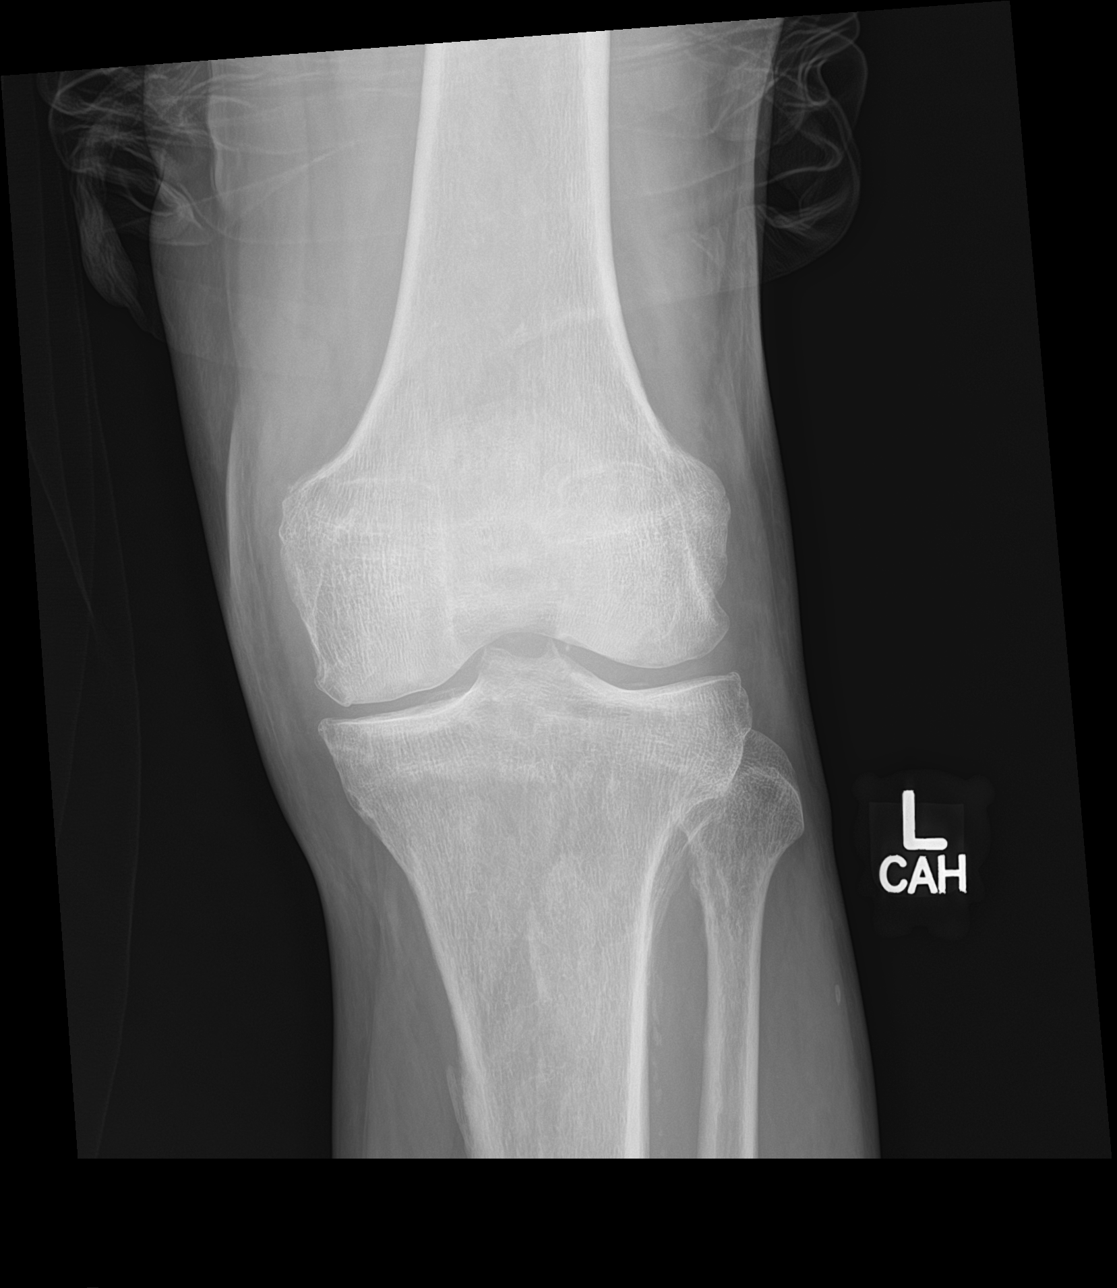

[knee lat]
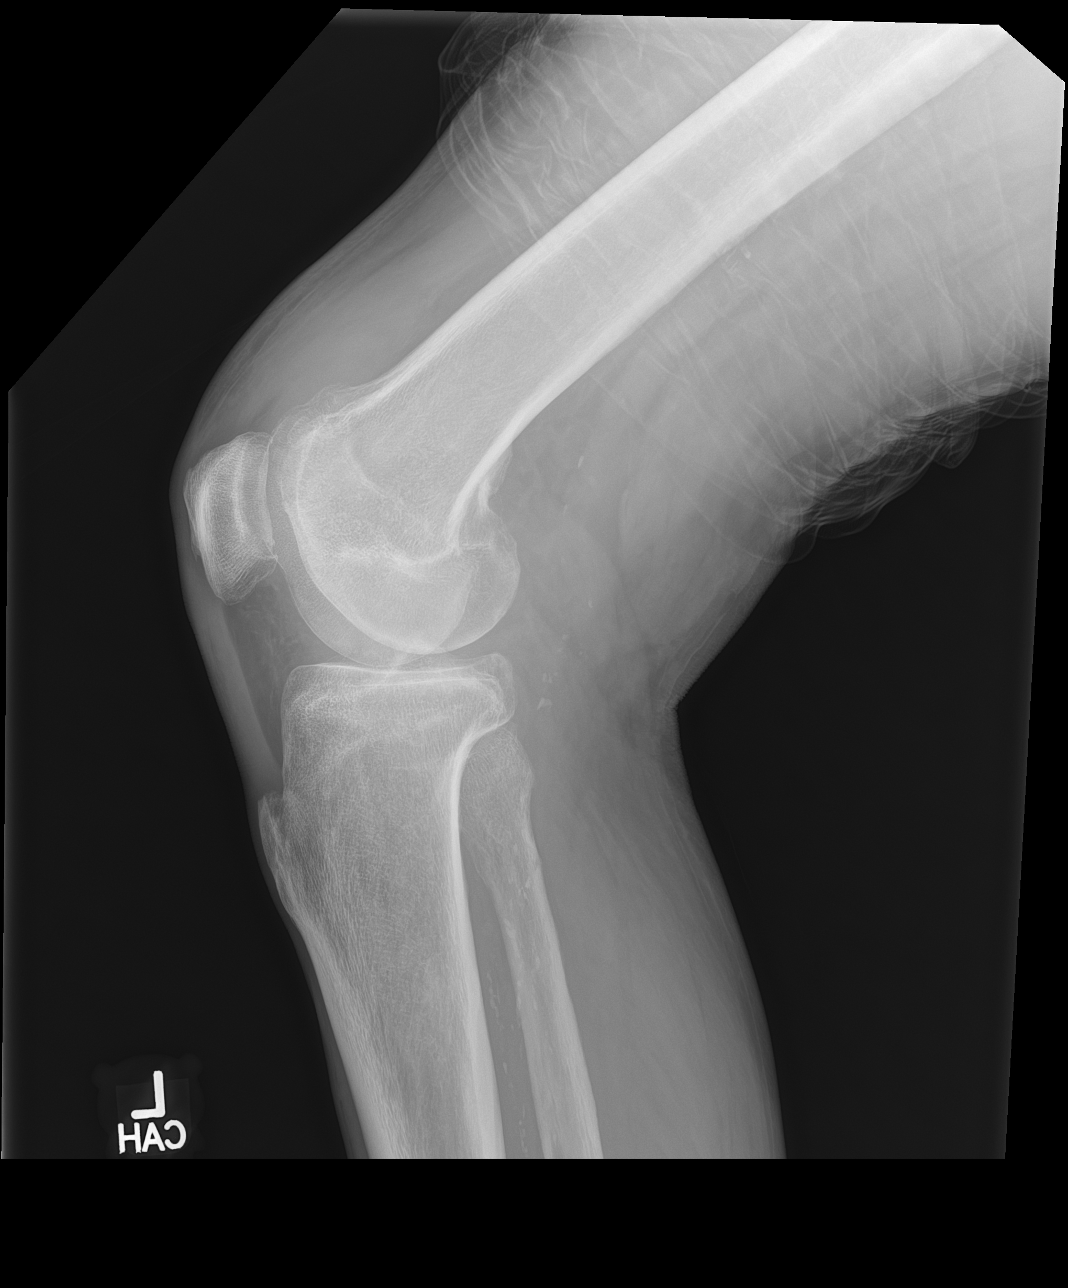

[knee obl (1 of 2)]
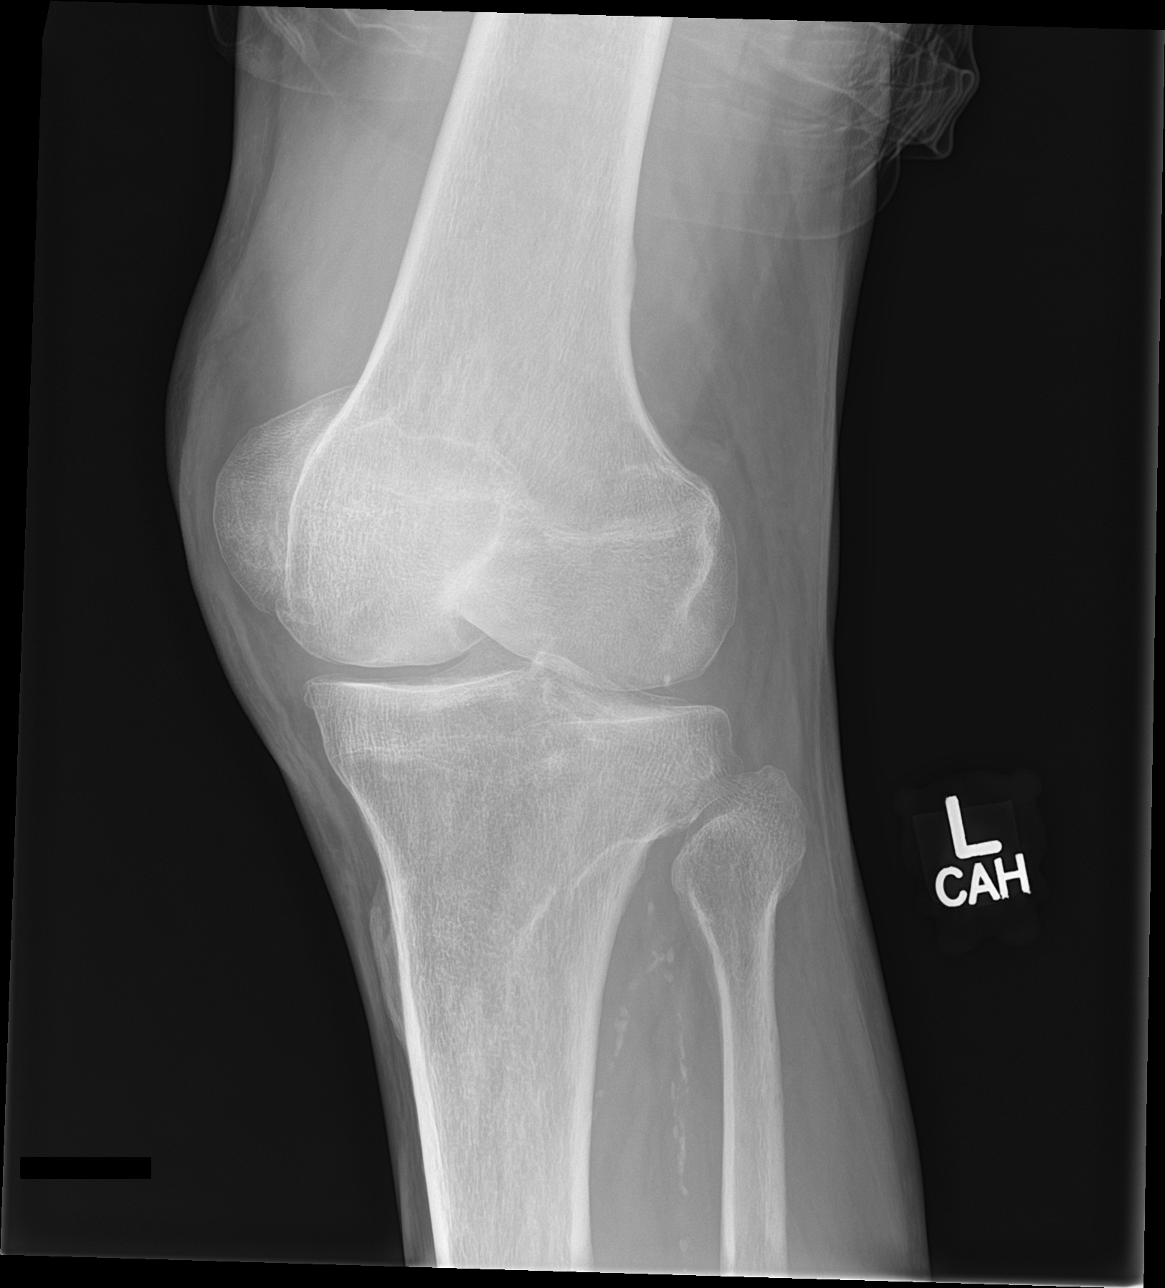

[knee obl (2 of 2)]
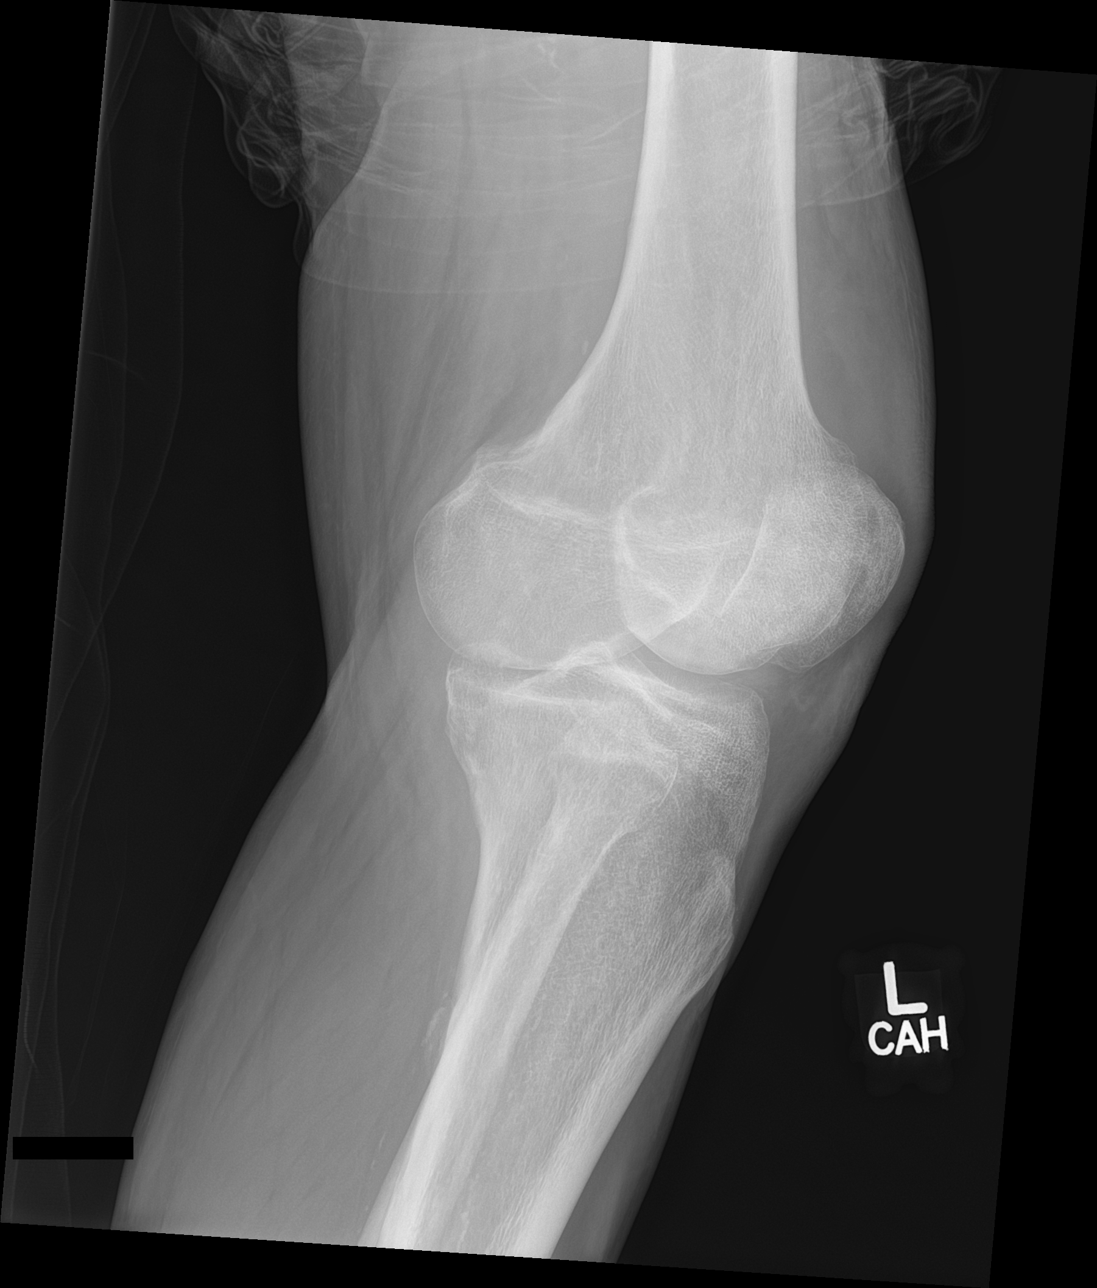

[4 of 4 positions shown; findings below may reference images not displayed]

FINDINGS: No acute fracture or dislocation. No aggressive osseous lesion.
Normal alignment. Generalized osteopenia. Mild osteoarthritis of the
medial femorotibial compartment. Small joint effusion.

Soft tissue are unremarkable. No radiopaque foreign body or soft
tissue emphysema. Peripheral vascular atherosclerotic disease.
IMPRESSION: Negative.

## 2020-12-24 MED ORDER — HYDROCODONE-ACETAMINOPHEN 5-325 MG PO TABS
1.0000 | ORAL_TABLET | Freq: Once | ORAL | Status: AC
Start: 2020-12-24 — End: 2020-12-24
  Administered 2020-12-24: 1 via ORAL
  Filled 2020-12-24: qty 1

## 2020-12-24 NOTE — Discharge Instructions (Signed)
You were seen in the emergency department today for left knee pain that started this morning.  While you are here we did an x-ray which showed that you did not have any fracture or dislocation of your knee. it does show that you have some arthritis changes which may be the source of your symptoms.  We have put you in an Ace wrap to help with stabilization of the knee.  I would also like you to take ibuprofen over the next few days to help with inflammation.  You may also use ice for 15 to 20 minutes at a time.  Please raise the knee above the level of your heart to promote reduction in swelling.  You may also return to the emergency department if you have increasing pain, fevers or complete inability to walk on the leg.

## 2020-12-24 NOTE — ED Provider Notes (Signed)
[facility] EMERGENCY DEPARTMENT Provider Note   CSN: [id number] Arrival date & time: 12/24/20  1104     History Chief Complaint  Patient presents with   Knee Pain    Peter Lambert is a 75 y.o. male.  With past medical history of type 2 diabetes, hyperlipidemia, hypertension who presents emergency department with left knee pain.  States that he began having left knee pain this morning getting out of bed.  Denies falling or any recent trauma to the knee.  He states that he noted some swelling to his knee and needed a cane to help ambulate.  He has bared weight on the leg.  No previous injuries or surgeries to the leg or knee.  He denies any fevers, pain or swelling of the calf, recent illness.  Swelling to the left knee  Warmer than right    Knee Pain Associated symptoms: no fever       Past Medical History:  Diagnosis Date   Acute upper GI bleed    Coronary artery disease    a. s/p NSTEMI in 2010 with DES to proximal LAD with D1 jailed and angioplasty alone to ostium   Hyperlipidemia    Hypertension    Hypertension    Peripheral arterial disease ([place])    Tobacco abuse    Type 2 diabetes mellitus ([place])     Patient Active Problem List   Diagnosis Date Noted   AKI (acute kidney injury) ([place]) 04/30/2020   Angiodysplasia of small intestine 04/30/2020   Iron deficiency anemia 01/19/2020   Constipation 01/19/2020   Paraesophageal hernia    Accidental fall 01/05/2020   Prolonged QT interval 01/05/2020   Atrial fibrillation, chronic ([place]) 01/05/2020   GI bleed 01/04/2020   Orthostatic hypotension 01/04/2020   Acute blood loss anemia 01/04/2020   Generalized weakness 01/04/2020   Hyponatremia 01/04/2020   Hyperkalemia 01/04/2020   CAD (coronary artery disease) 01/04/2020   HFrEF (heart failure with reduced ejection fraction) ([place]) 01/04/2020   ICD (implantable cardioverter-defibrillator) in place 12/15/2019   Acute renal failure ([place]) 09/02/2019   Hypokalemia  09/02/2019   Hyperglycemia 09/02/2019   Hypoalbuminemia 09/02/2019   Elevated brain natriuretic peptide (BNP) level 09/02/2019   Lactic acidosis 09/02/2019   Leukocytosis 09/02/2019   Proteinuria 09/02/2019   Hypomagnesemia 09/02/2019   NSTEMI (non-ST elevated myocardial infarction) ([place]) 09/02/2019   CKD (chronic kidney disease), stage III b 09/02/2019   PVC's (premature ventricular contractions) 09/02/2019   NSVT (nonsustained ventricular tachycardia) 09/02/2019   Thrombocytopenia ([place]) 09/02/2019   Anemia 09/02/2019   Severe sepsis with septic shock (HCC) suspected on admission, source: ? aspiration 85/88/5027   Acute metabolic encephalopathy 74/01/8785   Acute systolic CHF (congestive heart failure) ([place]) 09/02/2019   Anticoagulated 09/02/2019   History of noncompliance with medical treatment 09/02/2019   Cardiac arrest with ventricular fibrillation (HCC)    Acute on chronic respiratory failure with hypoxia ([address]) 04/23/2019   Lobar pneumonia ([place]) 04/22/2019   Aspiration pneumonia ([place]), bilateral    Demand ischemia of myocardium (HCC)    Atrial flutter ([place])    Acute on chronic diastolic heart failure ([place])    Acute respiratory failure with hypoxemia ([place]) 07/02/2016   COPD (chronic obstructive pulmonary disease) ([place]) 07/02/2016   Tobacco use 07/17/2013   Peripheral arterial disease ([place]) 12/03/2012   Coronary artery disease s/p DES to LAD in 2010, s/p PCI to proximal LAD stenosis with DES this hospitalization 12/03/2012   Essential hypertension 12/03/2012   Hyperlipidemia 12/03/2012  Type 2 diabetes mellitus ([place]) 12/03/2012    Past Surgical History:  Procedure Laterality Date   CARDIAC SURGERY     COLONOSCOPY WITH PROPOFOL N/A 01/08/2020   Procedure: COLONOSCOPY WITH PROPOFOL;  Surgeon: Eloise [name], DO;  [facility];  Service: Endoscopy;  Laterality: N/A;   COLONOSCOPY WITH PROPOFOL N/A 05/03/2020   [place]:entire examined colon normal   CORONARY  STENT INTERVENTION N/A 09/03/2019   Procedure: CORONARY STENT INTERVENTION;  Surgeon: Burnell Blanks, MD;  Location: [place] [facility];  Service: Cardiovascular;  Laterality: N/A;   CORONARY STENT PLACEMENT     ENTEROSCOPY N/A 05/03/2020   hung:normal esophagus, normal stomach. Three non-bleeding angiodysplastic lesions in the duodenum. Treated with a monopolar probe. A few non-bleeding angiodysplastic lesions in the jejunum. Treated with a monopolar probe. No specimens collected.   ESOPHAGOGASTRODUODENOSCOPY (EGD) WITH PROPOFOL N/A 01/06/2020   Procedure: ESOPHAGOGASTRODUODENOSCOPY (EGD) WITH PROPOFOL;  Surgeon: Rogene Houston, MD;  Location[facility];  Service: Endoscopy;  Laterality: N/A;   HOT HEMOSTASIS N/A 05/03/2020   Procedure: HOT HEMOSTASIS (ARGON PLASMA COAGULATION/BICAP);  Surgeon: Carol Ada, MD;  Location: [place];  Service: Gastroenterology;  Laterality: N/A;   ICD IMPLANT N/A 09/04/2019   Procedure: ICD IMPLANT;  Surgeon: Evans Lance, MD;  Location: [place] [facility];  Service: Cardiovascular;  Laterality: N/A;   RIGHT/LEFT HEART CATH AND CORONARY ANGIOGRAPHY N/A 09/03/2019   Procedure: RIGHT/LEFT HEART CATH AND CORONARY ANGIOGRAPHY;  Surgeon: Jolaine Artist, MD;  Location: [facility];  Service: Cardiovascular;  Laterality: N/A;       Family History  Problem Relation Age of Onset   Cancer Mother        not sure location    Colon cancer Neg Hx    Stomach cancer Neg Hx     Social History   Tobacco Use   Smoking status: Former    Packs/day: 0.50    Years: 52.00    Pack years: 26.00    Types: Cigarettes   Smokeless tobacco: Never  Vaping Use   Vaping Use: Never used  Substance Use Topics   Alcohol use: No   Drug use: No    Home Medications Prior to Admission medications   Medication Sig Start Date End Date Taking? Authorizing Provider  acetaminophen (TYLENOL) 325 MG tablet Take 2 tablets (650 mg total) by mouth every 6  (six) hours as needed for mild pain, fever or headache (or Fever >/= 101). 04/28/19   Roxan Hockey, MD  albuterol (VENTOLIN HFA) 108 (90 Base) MCG/ACT inhaler Inhale 2 puffs into the lungs every 4 (four) hours as needed for wheezing or shortness of breath. 04/28/19   Roxan Hockey, MD  amiodarone (PACERONE) 100 MG tablet Take 1 tablet (100 mg total) by mouth daily. 05/04/20 09/27/20  Kayleen Memos, DO  apixaban (ELIQUIS) 5 MG TABS tablet Take 1 tablet (5 mg total) by mouth 2 (two) times daily. 05/06/20   Kayleen Memos, DO  atorvastatin (LIPITOR) 80 MG tablet Take 80 mg by mouth daily.    [provider]  carvedilol (COREG) 3.125 MG tablet Take 1 tablet (3.125 mg total) by mouth daily with breakfast. 05/04/20 09/27/20  Kayleen Memos, DO  CVS GENTLE LAXATIVE 5 MG EC tablet TAKE 1 TABLET BY MOUTH DAILY AS NEEDED FOR MODERATE CONSTIPATION. TAKE TWO DAYS BEFORE PROCEDURE Patient taking differently: Take 5 mg by mouth daily as needed. 05/30/20   Harvel Quale, MD  cyanocobalamin (,  VITAMIN B-12,) 1000 MCG/ML injection Inject 1,000 mcg into the muscle every 30 (thirty) days.    [provider]  cyanocobalamin 1000 MCG tablet Take 1,000 mcg by mouth daily.    [provider]  ferrous sulfate (FERROUSUL) 325 (65 FE) MG tablet Take 1 tablet (325 mg total) by mouth daily with breakfast. 01/19/20   Rehman, Mechele Dawley, MD  fluocinonide cream (LIDEX) 4.09 % Apply 1 application topically 2 (two) times daily as needed (rash). 12/23/19   [provider]  furosemide (LASIX) 20 MG tablet Take 20 mg by mouth daily.    [provider]  guaiFENesin (MUCINEX) 600 MG 12 hr tablet Take 600 mg by mouth 2 (two) times daily as needed for cough or to loosen phlegm.    [provider]  nitroGLYCERIN (NITROSTAT) 0.4 MG SL tablet Place 1 tablet (0.4 mg total) under the tongue every 5 (five) minutes as needed for chest pain. Pt needs to make appt with provider for further  refills 11/07/20   Lorretta Harp, MD  omeprazole (PRILOSEC) 20 MG capsule Take 20 mg by mouth daily.    [provider]  polyethylene glycol powder (GLYCOLAX/MIRALAX) 17 GM/SCOOP powder Take 17 g by mouth daily. 06/07/20   Rehman, Mechele Dawley, MD  potassium chloride SA (KLOR-CON) 20 MEQ tablet Take 20 mEq by mouth daily. Patient not taking: Reported on 09/27/2020    [provider]  psyllium (METAMUCIL SMOOTH TEXTURE) 58.6 % powder Take 1 packet by mouth at bedtime. 06/07/20   Rogene Houston, MD  vitamin B-12 1000 MCG tablet Take 1 tablet (1,000 mcg total) by mouth daily. 01/09/20   Murlean Iba, MD  potassium chloride (KLOR-CON) 20 MEQ packet TAKE 1 TABLET BY MOUTH EVERY DAY 12/24/12 07/14/13  Lorretta Harp, MD    Allergies    Patient has no known allergies.  Review of Systems   Review of Systems  Constitutional:  Negative for fever.  Musculoskeletal:  Positive for arthralgias, gait problem and joint swelling.  All other systems reviewed and are negative.  Physical Exam Updated Vital Signs BP 135/66 (BP Location: Right Arm)   Pulse 66   Temp 99 F (37.2 C) (Oral)   Resp 17   Ht 5\' 8"  (1.727 m)   Wt 78.8 kg   SpO2 100%   BMI 26.43 kg/m   Physical Exam Vitals and nursing note reviewed.  Constitutional:      Appearance: Normal appearance. He is not toxic-appearing.  HENT:     Head: Normocephalic.  Eyes:     General: No scleral icterus. Cardiovascular:     Rate and Rhythm: Normal rate and regular rhythm.     Pulses: Normal pulses.  Pulmonary:     Effort: Pulmonary effort is normal.     Breath sounds: Normal breath sounds.  Abdominal:     General: Bowel sounds are normal.     Palpations: Abdomen is soft.  Musculoskeletal:     Right hip: Normal.     Left hip: Normal.     Right knee: Normal.     Left knee: Swelling and effusion present. No deformity or erythema. Tenderness present over the medial joint line. Normal pulse.     Right lower leg:  Normal.     Left lower leg: Normal.       Legs:     Comments: Mild swelling/effusion present to the left knee predominantly to the medial knee.  Tenderness to palpation of the medial joint  line.  He is able to lift his leg off the bed without assistance.  Range of motion of the left hip is normal.  Range of motion of left ankle is normal.  He has difficulty actively moving the left knee through range of motion however I am able to passively move the left knee through range of motion with pain.  There is no redness or warmth concerning for septic arthritis.  Skin:    General: Skin is warm and dry.     Capillary Refill: Capillary refill takes less than 2 seconds.  Neurological:     General: No focal deficit present.     Mental Status: He is alert and oriented to person, place, and time. Mental status is at baseline.  Psychiatric:        Mood and Affect: Mood normal.        Behavior: Behavior normal.    ED Results / Procedures / Treatments   Labs (all labs ordered are listed, but only abnormal results are displayed) Labs Reviewed - No data to display  EKG None  Radiology DG Knee Complete 4 Views Left  Result Date: 12/24/2020 CLINICAL DATA:  Left knee pain EXAM: LEFT KNEE - COMPLETE 4+ VIEW COMPARISON:  None. FINDINGS: No acute fracture or dislocation. No aggressive osseous lesion. Normal alignment. Generalized osteopenia. Mild osteoarthritis of the medial femorotibial compartment. Small joint effusion. Soft tissue are unremarkable. No radiopaque foreign body or soft tissue emphysema. Peripheral vascular atherosclerotic disease. IMPRESSION: Negative. Electronically Signed   By: Kathreen Devoid M.D.   On: 12/24/2020 12:53    Procedures Procedures   Medications Ordered in ED Medications  HYDROcodone-acetaminophen (NORCO/VICODIN) 5-325 MG per tablet 1 tablet (1 tablet Oral Given 12/24/20 1408)    ED Course  I have reviewed the triage vital signs and the nursing notes.  Pertinent labs &  imaging results that were available during my care of the patient were reviewed by me and considered in my medical decision making (see chart for details).    MDM Rules/Calculators/A&P 75 year old male who presents emergency department with left knee pain.  Given history, exam and work-up patient likely has arthritis.   X-ray of the left knee demonstrates mild osteoarthritis of the medial femorotibial compartment and small joint effusion.  This is consistent with physical exam with pain worse on the medial aspect of the knee joint.  There is no fracture or dislocation. Presentation is not consistent with septic arthritis, gonococcal arthropathy.  He is able to bear weight and ambulate with cane. Given 1 dose of Norco here for pain control. Ace wrap here for compression Instructed to rest, ice, elevation, compression over the next few days.  Instructed to use ibuprofen every 6 hours to help with inflammation.  Instructed return the emergency department if he is unable to bear weight, has further swelling or fevers to the knee.  He verbalizes understanding with teach back.  Vitals are stable he is safe for discharge.  Final Clinical Impression(s) / ED Diagnoses Final diagnoses:  Acute pain of left knee    Rx / DC Orders ED Discharge Orders     None        Mickie Hillier, PA-C 12/24/20 1418    Jeanell Sparrow, DO 12/24/20 1628

## 2020-12-24 NOTE — ED Triage Notes (Signed)
Left knee pain onset this am, states he woke up and was unable to walk

## 2020-12-29 ENCOUNTER — Ambulatory Visit (INDEPENDENT_AMBULATORY_CARE_PROVIDER_SITE_OTHER): Payer: PPO | Admitting: Internal Medicine

## 2020-12-29 ENCOUNTER — Encounter (INDEPENDENT_AMBULATORY_CARE_PROVIDER_SITE_OTHER): Payer: Self-pay | Admitting: Internal Medicine

## 2020-12-29 ENCOUNTER — Other Ambulatory Visit: Payer: Self-pay

## 2020-12-29 ENCOUNTER — Encounter: Payer: Self-pay | Admitting: *Deleted

## 2020-12-29 VITALS — BP 94/59 | HR 73 | Temp 98.4°F | Ht 68.0 in | Wt 173.5 lb

## 2020-12-29 DIAGNOSIS — K5520 Angiodysplasia of colon without hemorrhage: Secondary | ICD-10-CM | POA: Diagnosis not present

## 2020-12-29 DIAGNOSIS — Z862 Personal history of diseases of the blood and blood-forming organs and certain disorders involving the immune mechanism: Secondary | ICD-10-CM | POA: Diagnosis not present

## 2020-12-29 DIAGNOSIS — D500 Iron deficiency anemia secondary to blood loss (chronic): Secondary | ICD-10-CM | POA: Diagnosis not present

## 2020-12-29 DIAGNOSIS — Z006 Encounter for examination for normal comparison and control in clinical research program: Secondary | ICD-10-CM

## 2020-12-29 NOTE — Patient Instructions (Signed)
physician will call with results of blood test when completed. Take omeprazole 20 mg by mouth every other day.  If you experience frequent heartburn you can go back to taking it daily and if you do not experience heartburn with every other day schedule can use it on as-needed basis. Remember you cannot take Advil Aleve or similar medications on regular basis given history of GI bleed and the fact that you were taking blood thinners. Notify if you have rectal bleeding

## 2020-12-29 NOTE — Progress Notes (Signed)
Presenting complaint;  History of iron deficiency anemia secondary to chronic GI bleed found to be from small bowel angiodysplasia.  Database andSubjective:  Patient is 75 year old Caucasian male who is here for scheduled visit. He has history of atrial fibrillation and is chronically anticoagulated with apixaban. Patient presented 1 year ago with progressive weakness.  His hemoglobin was 4.9 g.  Iron studies confirmed iron deficiency anemia.  Stool guaiac was positive. Esophagogastroduodenoscopy revealed small paraesophageal hernia which could not be confirmed on upper GI series at a later date.  Colonoscopy revealed pancolonic diverticulosis and internal hemorrhoids.   He did receive 2 units of PRBCs during that admission. Since patient's colonoscopy prep was suboptimal it was decided to repeat his colonoscopy and he was scheduled in March 2022.  In the meantime he developed extreme weakness and postural symptoms and was seen at [facility] on 04/26/2020.  His hemoglobin was 6.1 g.  He received blood transfusion.  He underwent push enteroscopy and colonoscopy by Dr. Carol Ada.  Enteroscopy revealed few duodenal and jejunal AV malformations which were nonbleeding.  These were ablated by APC.  Colonoscopy did not reveal any bleeding source.  His preparation was felt to be satisfactory. Patient has not had weak spells since then.  He was seen in the office 3 months ago and his hemoglobin was 12.1 g. He has no complaints.  He states his stool is black because he is taking an iron pill every day.  He does complain of constipation and takes Ex-Lax no more than once a week.  Usually takes 2 tablets.  He does not experience cramping or diarrhea.  He rarely experiences heartburn.  He denies nausea vomiting dysphagia or abdominal pain.  He also denies rectal bleeding. Patient says he was seen in emergency room last week because he developed acute left knee pain.  Plain film showed small effusion and mild  arthritis.  He was advised to take ibuprofen which he took for 2 days and stop.  He is not having any pain in his knee at the present time.  Current Medications: Outpatient Encounter Medications as of 12/29/2020  Medication Sig   acetaminophen (TYLENOL) 325 MG tablet Take 2 tablets (650 mg total) by mouth every 6 (six) hours as needed for mild pain, fever or headache (or Fever >/= 101).   albuterol (VENTOLIN HFA) 108 (90 Base) MCG/ACT inhaler Inhale 2 puffs into the lungs every 4 (four) hours as needed for wheezing or shortness of breath.   amiodarone (PACERONE) 100 MG tablet Take 1 tablet (100 mg total) by mouth daily.   apixaban (ELIQUIS) 5 MG TABS tablet Take 1 tablet (5 mg total) by mouth 2 (two) times daily.   atorvastatin (LIPITOR) 80 MG tablet Take 80 mg by mouth daily.   carvedilol (COREG) 3.125 MG tablet Take 1 tablet (3.125 mg total) by mouth daily with breakfast.   [employer]S GENTLE LAXATIVE 5 MG EC tablet TAKE 1 TABLET BY MOUTH DAILY AS NEEDED FOR MODERATE CONSTIPATION. TAKE TWO DAYS BEFORE PROCEDURE (Patient taking differently: Take 5 mg by mouth daily as needed.)   cyanocobalamin (,VITAMIN B-12,) 1000 MCG/ML injection Inject 1,000 mcg into the muscle every 30 (thirty) days.   cyanocobalamin 1000 MCG tablet Take 1,000 mcg by mouth daily.   ferrous sulfate (FERROUSUL) 325 (65 FE) MG tablet Take 1 tablet (325 mg total) by mouth daily with breakfast.   fluocinonide cream (LIDEX) 0.62 % Apply 1 application topically 2 (two) times daily as needed (rash).   furosemide (  LASIX) 20 MG tablet Take 20 mg by mouth daily.   guaiFENesin (MUCINEX) 600 MG 12 hr tablet Take 600 mg by mouth 2 (two) times daily as needed for cough or to loosen phlegm.   nitroGLYCERIN (NITROSTAT) 0.4 MG SL tablet Place 1 tablet (0.4 mg total) under the tongue every 5 (five) minutes as needed for chest pain. Pt needs to make appt with provider for further refills   omeprazole (PRILOSEC) 20 MG capsule Take 20 mg by mouth daily.    potassium chloride SA (KLOR-CON) 20 MEQ tablet Take 20 mEq by mouth daily.   vitamin B-12 1000 MCG tablet Take 1 tablet (1,000 mcg total) by mouth daily.   [DISCONTINUED] polyethylene glycol powder (GLYCOLAX/MIRALAX) 17 GM/SCOOP powder Take 17 g by mouth daily.   [DISCONTINUED] potassium chloride (KLOR-CON) 20 MEQ packet TAKE 1 TABLET BY MOUTH EVERY DAY   [DISCONTINUED] psyllium (METAMUCIL SMOOTH TEXTURE) 58.6 % powder Take 1 packet by mouth at bedtime.   No facility-administered encounter medications on file as of 12/29/2020.     Objective: Blood pressure (!) 94/59, pulse 73, temperature 98.4 F (36.9 C), temperature source Oral, height 5' 8"  (1.727 m), weight 173 lb 8 oz (78.7 kg). Patient is alert and in no acute distress. Conjunctiva is pink. Sclera is nonicteric Oropharyngeal mucosa is normal. No neck masses or thyromegaly noted. Cardiac exam with regular rhythm normal S1 and S2.  Heart sounds are distant.  No murmur or gallop noted. Lungs are clear to auscultation. Abdomen is full.  He has long midline abdominal scar covering on the left umbilicus.  A small umbilical hernia which is completely reducible.  On palpation is soft and nontender with organomegaly or masses. No LE edema or clubbing noted.  Labs/studies Results:   CBC Latest Ref Rng & Units 09/27/2020 08/08/2020 07/05/2020  WBC 3.8 - 10.8 Thousand/uL 7.8 6.3 7.8  Hemoglobin 13.2 - 17.1 g/dL 12.1(L) 11.8(L) 11.3(L)  Hematocrit 38.5 - 50.0 % 35.7(L) 34.4(L) 34.0(L)  Platelets 140 - 400 Thousand/uL 182 116(L) 200    CMP Latest Ref Rng & Units 05/02/2020 05/01/2020 04/30/2020  Glucose 70 - 99 mg/dL 99 108(H) 120(H)  BUN 8 - 23 mg/dL 15 24(H) 29(H)  Creatinine 0.61 - 1.24 mg/dL 1.38(H) 1.45(H) 1.48(H)  Sodium 135 - 145 mmol/L 134(L) 136 137  Potassium 3.5 - 5.1 mmol/L 4.1 3.8 4.0  Chloride 98 - 111 mmol/L 104 105 109  CO2 22 - 32 mmol/L 22 22 23   Calcium 8.9 - 10.3 mg/dL 8.6(L) 8.5(L) 8.3(L)  Total Protein 6.5 - 8.1 g/dL - - -   Total Bilirubin 0.3 - 1.2 mg/dL - - -  Alkaline Phos 38 - 126 U/L - - -  AST 15 - 41 U/L - - -  ALT 0 - 44 U/L - - -    Hepatic Function Latest Ref Rng & Units 04/29/2020 04/26/2020 02/04/2020  Total Protein 6.5 - 8.1 g/dL 6.1(L) 6.4(L) 6.9  Albumin 3.5 - 5.0 g/dL 3.5 3.5 3.5  AST 15 - 41 U/L 20 16 16   ALT 0 - 44 U/L 18 17 18   Alk Phosphatase 38 - 126 U/L 57 42 76  Total Bilirubin 0.3 - 1.2 mg/dL 0.8 0.3 0.5  Bilirubin, Direct 0.00 - 0.40 mg/dL - - -     Assessment:  #1.  History of iron deficiency anemia secondary to chronic GI bleed.  Initial work-up was 1 year ago and no bleeding lesion was found on EGD and a colonoscopy with colonoscopy prep  was suboptimal prep.  Before he could undergo repeat colonoscopy and given capsule study he ended up at [facility] with hemoglobin of 6.1 g requiring transfusion.  Push enteroscopy reveals nonbleeding duodenal and jejunal AV malformations which were ablated with APC.  No bleeding lesion was found on colonoscopy. Hemoglobin 3 months ago was over 12 g.  He has not had any follow-up labs since then.  He has not have any symptoms suggest drop in his H&H.  #2.  Patient is chronically anticoagulated because of history of atrial fibrillation.  He therefore should not use NSAIDs unless absolutely necessary in which case he should be no more than 2 or 3 days.  He should stick with Tylenol which she can take up to 2 g/day divided dose without any  #3.  Patient is on omeprazole.  He is not having symptoms of GERD.  EGD November 2021 suggested paraesophageal hernia but this could not be confirmed on upper GI series.  Therefore he can take omeprazole less often or on as-needed basis.  Plan:  Patient advised to avoid NSAIDs unless absolutely necessary in which case you should be limited to no more than 2 or 3 days at the lowest possible dose. He should stick with Tylenol and he can take 2 g/day in divided dose as needed. Patient will go to the lab for CBC,  serum iron TIBC and ferritin. He will try omeprazole every other day and if he does not experience GERD symptoms he can start using it on a as needed basis. Office visit on as-needed basis.

## 2020-12-29 NOTE — Research (Signed)
I called patient for 71-month Coordinate-Diabetes Study.Patient is doing well.Patient continues to watch diet and walk for exercise. His last hemoglobin A1C was 6.1% Patient is not on any diabetic meds. Patient is not on ACE or ARB due to hypotension and acute kidney injury back on March 8,2022. Patient does follow-up with primary doctor. This call is our last call for Coordinate-Diabetes Study. Patient had agreed to let [facility] follow him for up to 5 years. I will send medical release form and social security index form for patient to sign and return to [place]. Patient agreed to complete the signatures and return back to [place].

## 2020-12-30 LAB — CBC
HCT: 28.8 % — ABNORMAL LOW (ref 38.5–50.0)
Hemoglobin: 9.6 g/dL — ABNORMAL LOW (ref 13.2–17.1)
MCH: 30.3 pg (ref 27.0–33.0)
MCHC: 33.3 g/dL (ref 32.0–36.0)
MCV: 90.9 fL (ref 80.0–100.0)
MPV: 11.2 fL (ref 7.5–12.5)
Platelets: 232 10*3/uL (ref 140–400)
RBC: 3.17 10*6/uL — ABNORMAL LOW (ref 4.20–5.80)
RDW: 13 % (ref 11.0–15.0)
WBC: 6.5 10*3/uL (ref 3.8–10.8)

## 2020-12-30 LAB — IRON,TIBC AND FERRITIN PANEL
%SAT: 67 % (calc) — ABNORMAL HIGH (ref 20–48)
Ferritin: 53 ng/mL (ref 24–380)
Iron: 205 ug/dL — ABNORMAL HIGH (ref 50–180)
TIBC: 305 mcg/dL (calc) (ref 250–425)

## 2021-01-04 ENCOUNTER — Other Ambulatory Visit: Payer: Self-pay | Admitting: Cardiovascular Disease

## 2021-01-04 DIAGNOSIS — E538 Deficiency of other specified B group vitamins: Secondary | ICD-10-CM | POA: Diagnosis not present

## 2021-01-25 ENCOUNTER — Other Ambulatory Visit (INDEPENDENT_AMBULATORY_CARE_PROVIDER_SITE_OTHER): Payer: Self-pay | Admitting: *Deleted

## 2021-01-25 DIAGNOSIS — D649 Anemia, unspecified: Secondary | ICD-10-CM

## 2021-02-02 DIAGNOSIS — E538 Deficiency of other specified B group vitamins: Secondary | ICD-10-CM | POA: Diagnosis not present

## 2021-02-02 DIAGNOSIS — J329 Chronic sinusitis, unspecified: Secondary | ICD-10-CM | POA: Diagnosis not present

## 2021-02-08 DIAGNOSIS — D649 Anemia, unspecified: Secondary | ICD-10-CM | POA: Diagnosis not present

## 2021-02-08 LAB — CBC WITH DIFFERENTIAL/PLATELET
Absolute Monocytes: 1487 cells/uL — ABNORMAL HIGH (ref 200–950)
Basophils Absolute: 44 cells/uL (ref 0–200)
Basophils Relative: 0.4 %
Eosinophils Absolute: 122 cells/uL (ref 15–500)
Eosinophils Relative: 1.1 %
HCT: 25.3 % — ABNORMAL LOW (ref 38.5–50.0)
Hemoglobin: 8.5 g/dL — ABNORMAL LOW (ref 13.2–17.1)
Lymphs Abs: 1210 cells/uL (ref 850–3900)
MCH: 30.4 pg (ref 27.0–33.0)
MCHC: 33.6 g/dL (ref 32.0–36.0)
MCV: 90.4 fL (ref 80.0–100.0)
MPV: 11.5 fL (ref 7.5–12.5)
Monocytes Relative: 13.4 %
Neutro Abs: 8236 cells/uL — ABNORMAL HIGH (ref 1500–7800)
Neutrophils Relative %: 74.2 %
Platelets: 206 10*3/uL (ref 140–400)
RBC: 2.8 10*6/uL — ABNORMAL LOW (ref 4.20–5.80)
RDW: 12.2 % (ref 11.0–15.0)
Total Lymphocyte: 10.9 %
WBC: 11.1 10*3/uL — ABNORMAL HIGH (ref 3.8–10.8)

## 2021-02-15 ENCOUNTER — Other Ambulatory Visit (INDEPENDENT_AMBULATORY_CARE_PROVIDER_SITE_OTHER): Payer: Self-pay | Admitting: *Deleted

## 2021-02-15 DIAGNOSIS — D649 Anemia, unspecified: Secondary | ICD-10-CM

## 2021-02-22 DIAGNOSIS — D649 Anemia, unspecified: Secondary | ICD-10-CM | POA: Diagnosis not present

## 2021-02-22 LAB — HEMOGLOBIN AND HEMATOCRIT, BLOOD
HCT: 28.9 % — ABNORMAL LOW (ref 38.5–50.0)
Hemoglobin: 9.4 g/dL — ABNORMAL LOW (ref 13.2–17.1)

## 2021-02-24 ENCOUNTER — Other Ambulatory Visit: Payer: Self-pay | Admitting: Cardiovascular Disease

## 2021-02-28 ENCOUNTER — Telehealth: Payer: Self-pay | Admitting: Cardiovascular Disease

## 2021-02-28 MED ORDER — NITROGLYCERIN 0.4 MG SL SUBL: 0.4000 mg | SUBLINGUAL_TABLET | SUBLINGUAL | 1 refills | Status: DC | PRN

## 2021-02-28 NOTE — Telephone Encounter (Signed)
Refills has been sent to the pharmacy. 

## 2021-02-28 NOTE — Telephone Encounter (Signed)
Note      *STAT* If patient is at the pharmacy, call can be transferred to refill team.     1. Which medications need to be refilled? (please list name of each medication and dose if known) nitroGLYCERIN (NITROSTAT) 0.4 MG SL tablet   2. Which pharmacy/location (including street and city if local pharmacy) is medication to be sent to? [id number] - [place], [place] [address]   3. Do they need a 30 day or 90 day supply? 90 day

## 2021-03-01 ENCOUNTER — Other Ambulatory Visit (INDEPENDENT_AMBULATORY_CARE_PROVIDER_SITE_OTHER): Payer: Self-pay | Admitting: Internal Medicine

## 2021-03-01 DIAGNOSIS — E538 Deficiency of other specified B group vitamins: Secondary | ICD-10-CM | POA: Diagnosis not present

## 2021-03-02 NOTE — Telephone Encounter (Signed)
Looks like patient stated at 06/07/2020 visit that he did not take this currently. I have called and asked that the patient please return call.

## 2021-03-06 ENCOUNTER — Ambulatory Visit (INDEPENDENT_AMBULATORY_CARE_PROVIDER_SITE_OTHER): Payer: PPO

## 2021-03-06 DIAGNOSIS — I469 Cardiac arrest, cause unspecified: Secondary | ICD-10-CM

## 2021-03-06 DIAGNOSIS — I4901 Ventricular fibrillation: Secondary | ICD-10-CM

## 2021-03-06 LAB — CUP PACEART REMOTE DEVICE CHECK
Battery Remaining Longevity: 156 mo
Battery Remaining Percentage: 100 %
Brady Statistic RA Percent Paced: 43 %
Brady Statistic RV Percent Paced: 0 %
Date Time Interrogation Session: 20230109081300
HighPow Impedance: 74 Ohm
Implantable Lead Implant Date: 20210709
Implantable Lead Implant Date: 20210709
Implantable Lead Location: 753859
Implantable Lead Location: 753860
Implantable Lead Model: 137
Implantable Lead Model: 7841
Implantable Lead Serial Number: 1063971
Implantable Lead Serial Number: 300935
Implantable Pulse Generator Implant Date: 20210709
Lead Channel Impedance Value: 426 Ohm
Lead Channel Impedance Value: 729 Ohm
Lead Channel Setting Pacing Amplitude: 2 V
Lead Channel Setting Pacing Amplitude: 2.2 V
Lead Channel Setting Pacing Pulse Width: 0.4 ms
Lead Channel Setting Sensing Sensitivity: 0.5 mV
Pulse Gen Serial Number: 224993

## 2021-03-06 NOTE — Telephone Encounter (Signed)
Called patient and he told me he was not taking med and did not need it.

## 2021-03-08 DIAGNOSIS — N183 Chronic kidney disease, stage 3 unspecified: Secondary | ICD-10-CM | POA: Diagnosis not present

## 2021-03-08 DIAGNOSIS — I739 Peripheral vascular disease, unspecified: Secondary | ICD-10-CM | POA: Diagnosis not present

## 2021-03-08 DIAGNOSIS — D6869 Other thrombophilia: Secondary | ICD-10-CM | POA: Diagnosis not present

## 2021-03-08 DIAGNOSIS — Z6826 Body mass index (BMI) 26.0-26.9, adult: Secondary | ICD-10-CM | POA: Diagnosis not present

## 2021-03-08 DIAGNOSIS — Z7901 Long term (current) use of anticoagulants: Secondary | ICD-10-CM | POA: Diagnosis not present

## 2021-03-08 DIAGNOSIS — I5032 Chronic diastolic (congestive) heart failure: Secondary | ICD-10-CM | POA: Diagnosis not present

## 2021-03-08 DIAGNOSIS — I4891 Unspecified atrial fibrillation: Secondary | ICD-10-CM | POA: Diagnosis not present

## 2021-03-08 DIAGNOSIS — Z7982 Long term (current) use of aspirin: Secondary | ICD-10-CM | POA: Diagnosis not present

## 2021-03-08 DIAGNOSIS — D696 Thrombocytopenia, unspecified: Secondary | ICD-10-CM | POA: Diagnosis not present

## 2021-03-08 DIAGNOSIS — Z515 Encounter for palliative care: Secondary | ICD-10-CM | POA: Diagnosis not present

## 2021-03-08 DIAGNOSIS — Z87891 Personal history of nicotine dependence: Secondary | ICD-10-CM | POA: Diagnosis not present

## 2021-03-08 DIAGNOSIS — J449 Chronic obstructive pulmonary disease, unspecified: Secondary | ICD-10-CM | POA: Diagnosis not present

## 2021-03-14 NOTE — Progress Notes (Signed)
Remote ICD transmission.   

## 2021-03-15 ENCOUNTER — Other Ambulatory Visit (INDEPENDENT_AMBULATORY_CARE_PROVIDER_SITE_OTHER): Payer: Self-pay | Admitting: *Deleted

## 2021-03-15 DIAGNOSIS — D649 Anemia, unspecified: Secondary | ICD-10-CM

## 2021-03-15 DIAGNOSIS — Z862 Personal history of diseases of the blood and blood-forming organs and certain disorders involving the immune mechanism: Secondary | ICD-10-CM

## 2021-03-22 DIAGNOSIS — Z862 Personal history of diseases of the blood and blood-forming organs and certain disorders involving the immune mechanism: Secondary | ICD-10-CM | POA: Diagnosis not present

## 2021-03-22 DIAGNOSIS — D649 Anemia, unspecified: Secondary | ICD-10-CM | POA: Diagnosis not present

## 2021-03-22 LAB — CBC WITH DIFFERENTIAL/PLATELET
Absolute Monocytes: 888 cells/uL (ref 200–950)
Basophils Absolute: 57 cells/uL (ref 0–200)
Basophils Relative: 0.8 %
Eosinophils Absolute: 263 cells/uL (ref 15–500)
Eosinophils Relative: 3.7 %
HCT: 22.8 % — ABNORMAL LOW (ref 38.5–50.0)
Hemoglobin: 7.5 g/dL — ABNORMAL LOW (ref 13.2–17.1)
Lymphs Abs: 1250 cells/uL (ref 850–3900)
MCH: 30.4 pg (ref 27.0–33.0)
MCHC: 32.9 g/dL (ref 32.0–36.0)
MCV: 92.3 fL (ref 80.0–100.0)
MPV: 11.6 fL (ref 7.5–12.5)
Monocytes Relative: 12.5 %
Neutro Abs: 4643 cells/uL (ref 1500–7800)
Neutrophils Relative %: 65.4 %
Platelets: 221 10*3/uL (ref 140–400)
RBC: 2.47 10*6/uL — ABNORMAL LOW (ref 4.20–5.80)
RDW: 13.2 % (ref 11.0–15.0)
Total Lymphocyte: 17.6 %
WBC: 7.1 10*3/uL (ref 3.8–10.8)

## 2021-03-23 ENCOUNTER — Other Ambulatory Visit (INDEPENDENT_AMBULATORY_CARE_PROVIDER_SITE_OTHER): Payer: Self-pay | Admitting: *Deleted

## 2021-03-23 DIAGNOSIS — D649 Anemia, unspecified: Secondary | ICD-10-CM

## 2021-03-23 DIAGNOSIS — Z862 Personal history of diseases of the blood and blood-forming organs and certain disorders involving the immune mechanism: Secondary | ICD-10-CM

## 2021-03-28 DIAGNOSIS — Z862 Personal history of diseases of the blood and blood-forming organs and certain disorders involving the immune mechanism: Secondary | ICD-10-CM | POA: Diagnosis not present

## 2021-03-28 DIAGNOSIS — D649 Anemia, unspecified: Secondary | ICD-10-CM | POA: Diagnosis not present

## 2021-03-29 ENCOUNTER — Emergency Department (HOSPITAL_COMMUNITY)
Admission: EM | Admit: 2021-03-29 | Discharge: 2021-03-30 | Disposition: A | Discharge status: Home / Self Care | Payer: PPO | Attending: Emergency Medicine | Admitting: Emergency Medicine

## 2021-03-29 ENCOUNTER — Encounter (HOSPITAL_COMMUNITY): Payer: Self-pay

## 2021-03-29 ENCOUNTER — Other Ambulatory Visit: Payer: Self-pay

## 2021-03-29 DIAGNOSIS — N189 Chronic kidney disease, unspecified: Secondary | ICD-10-CM | POA: Diagnosis not present

## 2021-03-29 DIAGNOSIS — Z79899 Other long term (current) drug therapy: Secondary | ICD-10-CM | POA: Diagnosis not present

## 2021-03-29 DIAGNOSIS — I129 Hypertensive chronic kidney disease with stage 1 through stage 4 chronic kidney disease, or unspecified chronic kidney disease: Secondary | ICD-10-CM | POA: Diagnosis not present

## 2021-03-29 DIAGNOSIS — K922 Gastrointestinal hemorrhage, unspecified: Secondary | ICD-10-CM | POA: Insufficient documentation

## 2021-03-29 DIAGNOSIS — J449 Chronic obstructive pulmonary disease, unspecified: Secondary | ICD-10-CM | POA: Diagnosis not present

## 2021-03-29 DIAGNOSIS — Z7901 Long term (current) use of anticoagulants: Secondary | ICD-10-CM | POA: Diagnosis not present

## 2021-03-29 DIAGNOSIS — N179 Acute kidney failure, unspecified: Secondary | ICD-10-CM | POA: Insufficient documentation

## 2021-03-29 DIAGNOSIS — E1122 Type 2 diabetes mellitus with diabetic chronic kidney disease: Secondary | ICD-10-CM | POA: Diagnosis not present

## 2021-03-29 DIAGNOSIS — D649 Anemia, unspecified: Secondary | ICD-10-CM | POA: Diagnosis not present

## 2021-03-29 DIAGNOSIS — I2510 Atherosclerotic heart disease of native coronary artery without angina pectoris: Secondary | ICD-10-CM | POA: Insufficient documentation

## 2021-03-29 LAB — CBC
HCT: 24.9 % — ABNORMAL LOW (ref 39.0–52.0)
Hemoglobin: 7.6 g/dL — ABNORMAL LOW (ref 13.0–17.0)
MCH: 30.6 pg (ref 26.0–34.0)
MCHC: 30.5 g/dL (ref 30.0–36.0)
MCV: 100.4 fL — ABNORMAL HIGH (ref 80.0–100.0)
Platelets: 207 10*3/uL (ref 150–400)
RBC: 2.48 MIL/uL — ABNORMAL LOW (ref 4.22–5.81)
RDW: 15.3 % (ref 11.5–15.5)
WBC: 6.9 10*3/uL (ref 4.0–10.5)
nRBC: 0 % (ref 0.0–0.2)

## 2021-03-29 LAB — COMPREHENSIVE METABOLIC PANEL
ALT: 26 U/L (ref 0–44)
AST: 21 U/L (ref 15–41)
Albumin: 3.5 g/dL (ref 3.5–5.0)
Alkaline Phosphatase: 65 U/L (ref 38–126)
Anion gap: 6 (ref 5–15)
BUN: 33 mg/dL — ABNORMAL HIGH (ref 8–23)
CO2: 25 mmol/L (ref 22–32)
Calcium: 8.5 mg/dL — ABNORMAL LOW (ref 8.9–10.3)
Chloride: 106 mmol/L (ref 98–111)
Creatinine, Ser: 2.52 mg/dL — ABNORMAL HIGH (ref 0.61–1.24)
GFR, Estimated: 26 mL/min — ABNORMAL LOW (ref >60)
Glucose, Bld: 92 mg/dL (ref 70–99)
Potassium: 4.4 mmol/L (ref 3.5–5.1)
Sodium: 137 mmol/L (ref 135–145)
Total Bilirubin: 0.1 mg/dL — ABNORMAL LOW (ref 0.3–1.2)
Total Protein: 6.5 g/dL (ref 6.5–8.1)

## 2021-03-29 LAB — IRON,TIBC AND FERRITIN PANEL
%SAT: 13 % (calc) — ABNORMAL LOW (ref 20–48)
Ferritin: 32 ng/mL (ref 24–380)
Iron: 38 ug/dL — ABNORMAL LOW (ref 50–180)
TIBC: 298 mcg/dL (calc) (ref 250–425)

## 2021-03-29 LAB — HEMOGLOBIN AND HEMATOCRIT, BLOOD
HCT: 23.2 % — ABNORMAL LOW (ref 38.5–50.0)
Hemoglobin: 7.6 g/dL — ABNORMAL LOW (ref 13.2–17.1)

## 2021-03-29 LAB — PREPARE RBC (CROSSMATCH)

## 2021-03-29 LAB — POC OCCULT BLOOD, ED: Fecal Occult Bld: POSITIVE — AB

## 2021-03-29 MED ORDER — SODIUM CHLORIDE 0.9 % IV BOLUS
1000.0000 mL | Freq: Once | INTRAVENOUS | Status: AC
  Administered 2021-03-29: 1000 mL via INTRAVENOUS

## 2021-03-29 MED ORDER — SODIUM CHLORIDE 0.9 % IV SOLN
10.0000 mL/h | Freq: Once | INTRAVENOUS | Status: AC
  Administered 2021-03-29: 10 mL/h via INTRAVENOUS

## 2021-03-29 NOTE — ED Provider Notes (Addendum)
[facility] EMERGENCY DEPARTMENT Provider Note   CSN: [id number] Arrival date & time: 03/29/21  1655     History  Chief Complaint  Patient presents with   Abnormal Labs    Peter Lambert is a 76 y.o. male with a history including CAD, hypertension, type 2 diabetes, COPD, chronic kidney disease, history of iron deficiency anemia secondary to GI bleed with small bowel endoscopy last year revealing AV malformations which were treated with ablation therapy presents here at the advice of his GI specialist Dr. Laural Golden for treatment of anemia.  He endorses having increasing fatigue and has felt lightheaded several times this week when he stands quickly, with symptoms resolving after minutes.  He had blood work completed this week by Dr. Laural Golden and his hemoglobin was found to be 7.5.  He was sent here for further evaluation.  He denies chest pain, palpitations, shortness of breath, abdominal pain, also denies nausea or vomiting, and is seeing no bloody stools.  He does endorse taking some Advil in December but has stopped taking this medication at Dr. Otelia Limes advice.   The history is provided by the patient.      Home Medications Prior to Admission medications   Medication Sig Start Date End Date Taking? Authorizing Provider  acetaminophen (TYLENOL) 325 MG tablet Take 2 tablets (650 mg total) by mouth every 6 (six) hours as needed for mild pain, fever or headache (or Fever >/= 101). 04/28/19  Yes Emokpae, Courage, MD  albuterol (VENTOLIN HFA) 108 (90 Base) MCG/ACT inhaler Inhale 2 puffs into the lungs every 4 (four) hours as needed for wheezing or shortness of breath. 04/28/19  Yes Emokpae, Courage, MD  apixaban (ELIQUIS) 5 MG TABS tablet Take 1 tablet (5 mg total) by mouth 2 (two) times daily. 05/06/20  Yes Hall, Carole N, DO  atorvastatin (LIPITOR) 80 MG tablet Take 80 mg by mouth daily.   Yes [provider]  [employer] GENTLE LAXATIVE 5 MG EC tablet TAKE 1 TABLET BY MOUTH DAILY AS NEEDED FOR  MODERATE CONSTIPATION. TAKE TWO DAYS BEFORE PROCEDURE Patient taking differently: Take 5 mg by mouth daily as needed. 05/30/20  Yes Harvel Quale, MD  cyanocobalamin (,VITAMIN B-12,) 1000 MCG/ML injection Inject 1,000 mcg into the muscle every 30 (thirty) days.   Yes [provider]  cyanocobalamin 1000 MCG tablet Take 1,000 mcg by mouth daily.   Yes [provider]  ferrous sulfate (FERROUSUL) 325 (65 FE) MG tablet Take 1 tablet (325 mg total) by mouth daily with breakfast. 01/19/20  Yes Rehman, Mechele Dawley, MD  furosemide (LASIX) 20 MG tablet Take 20 mg by mouth daily.   Yes [provider]  guaiFENesin (MUCINEX) 600 MG 12 hr tablet Take 600 mg by mouth 2 (two) times daily as needed for cough or to loosen phlegm.   Yes [provider]  losartan (COZAAR) 25 MG tablet Take 25 mg by mouth daily. 01/14/21  Yes [provider]  nitroGLYCERIN (NITROSTAT) 0.4 MG SL tablet Place 1 tablet (0.4 mg total) under the tongue every 5 (five) minutes as needed for chest pain. Patient must keep upcoming appointment for further refills 02/28/21  Yes Lorretta Harp, MD  omeprazole (PRILOSEC) 20 MG capsule Take 20 mg by mouth daily.   Yes [provider]  potassium chloride SA (KLOR-CON) 20 MEQ tablet Take 20 mEq by mouth daily.   Yes [provider]  spironolactone (ALDACTONE) 50 MG tablet Take 50 mg by mouth daily. 02/11/21  Yes [provider]  amiodarone (PACERONE) 100 MG tablet Take 1 tablet (100 mg total) by mouth daily. Patient not taking: Reported on 03/29/2021 05/04/20 12/29/20  Kayleen Memos, DO  carvedilol (COREG) 3.125 MG tablet Take 1 tablet (3.125 mg total) by mouth daily with breakfast. Patient not taking: Reported on 03/29/2021 05/04/20 12/29/20  Kayleen Memos, DO  potassium chloride (KLOR-CON) 20 MEQ packet TAKE 1 TABLET BY MOUTH EVERY DAY 12/24/12 07/14/13  Lorretta Harp, MD      Allergies    Patient has no known allergies.     Review of Systems   Review of Systems  Constitutional:  Negative for chills and fever.  HENT:  Negative for congestion.   Eyes: Negative.   Respiratory:  Negative for chest tightness and shortness of breath.   Cardiovascular:  Negative for chest pain.  Gastrointestinal:  Negative for abdominal pain, blood in stool, nausea and vomiting.  Genitourinary: Negative.   Musculoskeletal:  Negative for arthralgias, joint swelling and neck pain.  Skin: Negative.  Negative for rash and wound.  Neurological:  Positive for light-headedness. Negative for dizziness, weakness, numbness and headaches.  Psychiatric/Behavioral: Negative.    All other systems reviewed and are negative.  Physical Exam Updated Vital Signs BP (!) 135/59    Pulse 60    Temp 98.3 F (36.8 C)    Resp 20    Ht 5\' 8"  (1.727 m)    Wt 78.9 kg    SpO2 100%    BMI 26.46 kg/m  Physical Exam Vitals and nursing note reviewed. Exam conducted with a chaperone present.  Constitutional:      Appearance: He is well-developed.  HENT:     Head: Normocephalic and atraumatic.  Eyes:     Conjunctiva/sclera: Conjunctivae normal.  Cardiovascular:     Rate and Rhythm: Normal rate and regular rhythm.     Heart sounds: Normal heart sounds.  Pulmonary:     Effort: Pulmonary effort is normal.     Breath sounds: Normal breath sounds. No wheezing.  Abdominal:     General: Bowel sounds are normal.     Palpations: Abdomen is soft.     Tenderness: There is no abdominal tenderness. There is no guarding.  Genitourinary:    Rectum: Guaiac result positive.     Comments: Patient is Hemoccult positive, stool sample is brown without gross blood. Musculoskeletal:        General: Normal range of motion.     Cervical back: Normal range of motion.  Skin:    General: Skin is warm and dry.  Neurological:     Mental Status: He is alert.    ED Results / Procedures / Treatments   Labs (all labs ordered are listed, but only abnormal results are  displayed) Labs Reviewed  COMPREHENSIVE METABOLIC PANEL - Abnormal; Notable for the following components:      Result Value   BUN 33 (*)    Creatinine, Ser 2.52 (*)    Calcium 8.5 (*)    Total Bilirubin 0.1 (*)    GFR, Estimated 26 (*)    All other components within normal limits  CBC - Abnormal; Notable for the following components:   RBC 2.48 (*)    Hemoglobin 7.6 (*)    HCT 24.9 (*)    MCV 100.4 (*)    All other components within normal limits  POC OCCULT BLOOD, ED - Abnormal; Notable for the following components:   Fecal Occult Bld POSITIVE (*)  All other components within normal limits  TYPE AND SCREEN  PREPARE RBC (CROSSMATCH)    EKG None  Radiology No results found.  Procedures Procedures    Medications Ordered in ED Medications  sodium chloride 0.9 % bolus 1,000 mL (0 mLs Intravenous Stopped 03/29/21 1901)  sodium chloride 0.9 % bolus 1,000 mL (0 mLs Intravenous Stopped 03/29/21 2151)  0.9 %  sodium chloride infusion (0 mL/hr Intravenous Stopped 03/30/21 0035)    ED Course/ Medical Decision Making/ A&P                           Medical Decision Making Patient with transient lightheadedness with a return of anemia who is Hemoccult positive.  He does have a slight bump in his BUN suggesting that this may be an upper GI source once again, possibly has new AVMs as source of bleeding.  He denies any reasons to be dehydrated today.  His creatinine is significantly elevated as well in comparison to prior creatinines.  Given his symptomatic anemia and his worsening renal insufficiency plus being Hemoccult positive patient was strongly advised that he needs to be admitted to the hospital.  We had a long discussion about the potential dangers of worsening symptoms including acute renal failure and worsening GI bleed which could also cause cardiac strain and death.  Patient is unwilling to stay.  He is given 1 unit of packed RBCs here.  He states he will follow-up with Dr. Laural Golden  tomorrow in his office.  He was given strict return precautions to return here for any new or worsening symptoms.  I have sent a message to Dr. Laural Golden as well advising him of patient's decision to go home tonight.  Amount and/or Complexity of Data Reviewed External Data Reviewed: labs and notes.    Details: Reviewed prior labs, GI procedures Labs: ordered.    Details: Anemia at 7.6 today, symptomatic in comparison to 9.4 from last month.  Hemoccult positive.  He also has a significant elevation in his creatinine today at 2.5 to.  This compares to 1.38 obtained 11 months ago.  Risk Prescription drug management.     CRITICAL CARE Performed by: Evalee Jefferson Total critical care time: 40 minutes - pt required prbc transfusion. Critical care time was exclusive of separately billable procedures and treating other patients. Critical care was necessary to treat or prevent imminent or life-threatening deterioration. Critical care was time spent personally by me on the following activities: development of treatment plan with patient and/or surrogate as well as nursing, discussions with consultants, evaluation of patient's response to treatment, examination of patient, obtaining history from patient or surrogate, ordering and performing treatments and interventions, ordering and review of laboratory studies, ordering and review of radiographic studies, pulse oximetry and re-evaluation of patient's condition.       Final Clinical Impression(s) / ED Diagnoses Final diagnoses:  Gastrointestinal hemorrhage, unspecified gastrointestinal hemorrhage type  Acute renal failure, unspecified acute renal failure type ([place])  Symptomatic anemia  Rx / DC Orders ED Discharge Orders     None         Landis Martins 03/30/21 0017    Daleen Bo, MD 03/30/21 0032    Evalee Jefferson, PA-C 03/31/21 1434    Daleen Bo, MD 04/01/21 2257

## 2021-03-29 NOTE — ED Provider Notes (Signed)
°  Face-to-face evaluation   History: He is here to be evaluated for abnormal labs, hemoglobin low and creatinine high.  He does not have any visualized rectal bleeding.  His hemoglobin is being followed by his GI doctor.  Physical exam: Alert elderly man.  No respiratory distress.  Abdomen is soft and nontender to palpation.  MDM: Evaluation for  Chief Complaint  Patient presents with   Abnormal Labs     Patient with incidental finding for anemia, which has been stable over the last week, but down from 3 weeks ago.  Stools guaiac positive.  Patient with renal insufficiency, nonspecific, worse than baseline.  Also require attention by his managing physicians.  Medical screening examination/treatment/procedure(s) were conducted as a shared visit with non-physician practitioner(s) and myself.  I personally evaluated the patient during the encounter    Daleen Bo, MD 03/30/21 [phone]

## 2021-03-29 NOTE — ED Triage Notes (Addendum)
Pt presents to ED with complaints of low hemoglobin 7.5. Pt sent by Dr Laural Golden. Pt denies rectal bleeding. Pt states he has been having dizzy spells

## 2021-03-30 LAB — TYPE AND SCREEN
ABO/RH(D): B POS
Antibody Screen: NEGATIVE
Unit division: 0

## 2021-03-30 LAB — BPAM RBC
Blood Product Expiration Date: 202303012359
ISSUE DATE / TIME: 202302012237
Unit Type and Rh: 1700

## 2021-03-30 NOTE — Discharge Instructions (Addendum)
As discussed you have decided to go home against our medical advice.  You are bleeding somewhere in your intestines and the safest decision would be for you to be admitted to the hospital for further evaluation of this condition.  Call Dr. Laural Golden tomorrow morning as discussed.  Additionally your kidney function tonight is very poor and you need repeat blood test within 24 hours to recheck your kidney function.  The safest thing would be for you to be admitted to the hospital for additional IV fluids.  Return here if you change your mind about being admitted to the hospital.

## 2021-03-31 ENCOUNTER — Other Ambulatory Visit (INDEPENDENT_AMBULATORY_CARE_PROVIDER_SITE_OTHER): Payer: Self-pay

## 2021-03-31 DIAGNOSIS — Z862 Personal history of diseases of the blood and blood-forming organs and certain disorders involving the immune mechanism: Secondary | ICD-10-CM

## 2021-03-31 DIAGNOSIS — I10 Essential (primary) hypertension: Secondary | ICD-10-CM

## 2021-03-31 DIAGNOSIS — D500 Iron deficiency anemia secondary to blood loss (chronic): Secondary | ICD-10-CM

## 2021-03-31 DIAGNOSIS — D649 Anemia, unspecified: Secondary | ICD-10-CM

## 2021-04-03 DIAGNOSIS — I10 Essential (primary) hypertension: Secondary | ICD-10-CM | POA: Diagnosis not present

## 2021-04-03 DIAGNOSIS — D649 Anemia, unspecified: Secondary | ICD-10-CM | POA: Diagnosis not present

## 2021-04-03 DIAGNOSIS — D500 Iron deficiency anemia secondary to blood loss (chronic): Secondary | ICD-10-CM | POA: Diagnosis not present

## 2021-04-03 DIAGNOSIS — Z862 Personal history of diseases of the blood and blood-forming organs and certain disorders involving the immune mechanism: Secondary | ICD-10-CM | POA: Diagnosis not present

## 2021-04-03 LAB — BASIC METABOLIC PANEL
BUN/Creatinine Ratio: 14 (calc) (ref 6–22)
BUN: 26 mg/dL — ABNORMAL HIGH (ref 7–25)
CO2: 26 mmol/L (ref 20–32)
Calcium: 8.5 mg/dL — ABNORMAL LOW (ref 8.6–10.3)
Chloride: 108 mmol/L (ref 98–110)
Creat: 1.81 mg/dL — ABNORMAL HIGH (ref 0.70–1.28)
Glucose, Bld: 113 mg/dL — ABNORMAL HIGH (ref 65–99)
Potassium: 4.4 mmol/L (ref 3.5–5.3)
Sodium: 141 mmol/L (ref 135–146)

## 2021-04-03 LAB — HEMOGLOBIN AND HEMATOCRIT, BLOOD
HCT: 25.5 % — ABNORMAL LOW (ref 38.5–50.0)
Hemoglobin: 8.3 g/dL — ABNORMAL LOW (ref 13.2–17.1)

## 2021-04-04 ENCOUNTER — Encounter (HOSPITAL_COMMUNITY): Payer: Self-pay

## 2021-04-04 ENCOUNTER — Encounter (HOSPITAL_COMMUNITY)
Admission: RE | Admit: 2021-04-04 | Discharge: 2021-04-04 | Disposition: A | Discharge status: Home / Self Care | Payer: PPO | Source: Ambulatory Visit | Attending: Internal Medicine | Admitting: Internal Medicine

## 2021-04-04 DIAGNOSIS — D509 Iron deficiency anemia, unspecified: Secondary | ICD-10-CM | POA: Insufficient documentation

## 2021-04-04 DIAGNOSIS — E538 Deficiency of other specified B group vitamins: Secondary | ICD-10-CM | POA: Diagnosis not present

## 2021-04-04 MED ORDER — SODIUM CHLORIDE 0.9 % IV SOLN
510.0000 mg | Freq: Once | INTRAVENOUS | Status: AC
  Administered 2021-04-04: 510 mg via INTRAVENOUS
  Filled 2021-04-04: qty 510

## 2021-04-04 MED ORDER — SODIUM CHLORIDE 0.9 % IV SOLN: Freq: Once | INTRAVENOUS | Status: AC

## 2021-04-05 ENCOUNTER — Other Ambulatory Visit (INDEPENDENT_AMBULATORY_CARE_PROVIDER_SITE_OTHER): Payer: Self-pay

## 2021-04-05 DIAGNOSIS — D500 Iron deficiency anemia secondary to blood loss (chronic): Secondary | ICD-10-CM

## 2021-04-05 DIAGNOSIS — N1831 Chronic kidney disease, stage 3a: Secondary | ICD-10-CM

## 2021-04-05 NOTE — Progress Notes (Signed)
I called and left a message asked that the patient please return call.   I have the lab orders placed in epic for 04/11/2021 to be done at [place] and they are in the front office desk for patient to pick up prior to having drawn.

## 2021-04-11 DIAGNOSIS — D500 Iron deficiency anemia secondary to blood loss (chronic): Secondary | ICD-10-CM | POA: Diagnosis not present

## 2021-04-11 DIAGNOSIS — N1831 Chronic kidney disease, stage 3a: Secondary | ICD-10-CM | POA: Diagnosis not present

## 2021-04-11 LAB — HEMOGLOBIN AND HEMATOCRIT, BLOOD
HCT: 29.5 % — ABNORMAL LOW (ref 38.5–50.0)
Hemoglobin: 9.6 g/dL — ABNORMAL LOW (ref 13.2–17.1)

## 2021-04-11 LAB — CREATININE, SERUM: Creat: 1.78 mg/dL — ABNORMAL HIGH (ref 0.70–1.28)

## 2021-04-11 LAB — BUN: BUN: 25 mg/dL (ref 7–25)

## 2021-04-12 ENCOUNTER — Other Ambulatory Visit (INDEPENDENT_AMBULATORY_CARE_PROVIDER_SITE_OTHER): Payer: Self-pay | Admitting: Internal Medicine

## 2021-04-12 DIAGNOSIS — D500 Iron deficiency anemia secondary to blood loss (chronic): Secondary | ICD-10-CM

## 2021-04-27 DIAGNOSIS — Z87891 Personal history of nicotine dependence: Secondary | ICD-10-CM | POA: Diagnosis not present

## 2021-04-27 DIAGNOSIS — Z955 Presence of coronary angioplasty implant and graft: Secondary | ICD-10-CM | POA: Diagnosis not present

## 2021-04-27 DIAGNOSIS — D649 Anemia, unspecified: Secondary | ICD-10-CM | POA: Diagnosis not present

## 2021-04-27 DIAGNOSIS — Z515 Encounter for palliative care: Secondary | ICD-10-CM | POA: Diagnosis not present

## 2021-04-27 DIAGNOSIS — E785 Hyperlipidemia, unspecified: Secondary | ICD-10-CM | POA: Diagnosis not present

## 2021-04-27 DIAGNOSIS — Z8679 Personal history of other diseases of the circulatory system: Secondary | ICD-10-CM | POA: Diagnosis not present

## 2021-04-27 DIAGNOSIS — I10 Essential (primary) hypertension: Secondary | ICD-10-CM | POA: Diagnosis not present

## 2021-04-27 DIAGNOSIS — I252 Old myocardial infarction: Secondary | ICD-10-CM | POA: Diagnosis not present

## 2021-04-27 DIAGNOSIS — I2510 Atherosclerotic heart disease of native coronary artery without angina pectoris: Secondary | ICD-10-CM | POA: Diagnosis not present

## 2021-05-02 ENCOUNTER — Other Ambulatory Visit (INDEPENDENT_AMBULATORY_CARE_PROVIDER_SITE_OTHER): Payer: Self-pay | Admitting: *Deleted

## 2021-05-02 ENCOUNTER — Other Ambulatory Visit (INDEPENDENT_AMBULATORY_CARE_PROVIDER_SITE_OTHER): Payer: Self-pay | Admitting: Internal Medicine

## 2021-05-02 DIAGNOSIS — D500 Iron deficiency anemia secondary to blood loss (chronic): Secondary | ICD-10-CM

## 2021-05-02 DIAGNOSIS — E538 Deficiency of other specified B group vitamins: Secondary | ICD-10-CM | POA: Diagnosis not present

## 2021-05-02 LAB — CBC
HCT: 27.9 % — ABNORMAL LOW (ref 38.5–50.0)
Hemoglobin: 9.2 g/dL — ABNORMAL LOW (ref 13.2–17.1)
MCH: 29.1 pg (ref 27.0–33.0)
MCHC: 33 g/dL (ref 32.0–36.0)
MCV: 88.3 fL (ref 80.0–100.0)
MPV: 11.4 fL (ref 7.5–12.5)
Platelets: 259 10*3/uL (ref 140–400)
RBC: 3.16 10*6/uL — ABNORMAL LOW (ref 4.20–5.80)
RDW: 13.4 % (ref 11.0–15.0)
WBC: 10.7 10*3/uL (ref 3.8–10.8)

## 2021-05-03 ENCOUNTER — Encounter: Payer: Self-pay | Admitting: Cardiovascular Disease

## 2021-05-03 ENCOUNTER — Ambulatory Visit: Payer: PPO | Admitting: Cardiovascular Disease

## 2021-05-03 ENCOUNTER — Other Ambulatory Visit: Payer: Self-pay

## 2021-05-03 DIAGNOSIS — E782 Mixed hyperlipidemia: Secondary | ICD-10-CM

## 2021-05-03 DIAGNOSIS — Z720 Tobacco use: Secondary | ICD-10-CM

## 2021-05-03 DIAGNOSIS — I5021 Acute systolic (congestive) heart failure: Secondary | ICD-10-CM | POA: Diagnosis not present

## 2021-05-03 DIAGNOSIS — I739 Peripheral vascular disease, unspecified: Secondary | ICD-10-CM

## 2021-05-03 DIAGNOSIS — I2510 Atherosclerotic heart disease of native coronary artery without angina pectoris: Secondary | ICD-10-CM

## 2021-05-03 DIAGNOSIS — I10 Essential (primary) hypertension: Secondary | ICD-10-CM

## 2021-05-03 DIAGNOSIS — Z95810 Presence of automatic (implantable) cardiac defibrillator: Secondary | ICD-10-CM | POA: Diagnosis not present

## 2021-05-03 LAB — HEPATIC FUNCTION PANEL
ALT: 43 IU/L (ref 0–44)
AST: 31 IU/L (ref 0–40)
Albumin: 4 g/dL (ref 3.7–4.7)
Alkaline Phosphatase: 103 IU/L (ref 44–121)
Bilirubin Total: <0.2 mg/dL (ref 0.0–1.2)
Bilirubin, Direct: <0.1 mg/dL (ref 0.00–0.40)
Total Protein: 7.1 g/dL (ref 6.0–8.5)

## 2021-05-03 LAB — LIPID PANEL
Chol/HDL Ratio: 2.7 ratio (ref 0.0–5.0)
Cholesterol, Total: 93 mg/dL — ABNORMAL LOW (ref 100–199)
HDL: 34 mg/dL — ABNORMAL LOW (ref >39)
LDL Chol Calc (NIH): 45 mg/dL (ref 0–99)
Triglycerides: 65 mg/dL (ref 0–149)
VLDL Cholesterol Cal: 14 mg/dL (ref 5–40)

## 2021-05-03 NOTE — Assessment & Plan Note (Signed)
History of CAD status post non-STEMI 4/10.  I catheterized him revealing a high-grade proximal LAD stenosis which I stented using a 2.75 x 18 mm long Xience 5 drug-eluting stent.  I did GL the first diagonal branch and performed angioplasty on the ostium through the stent struts.  He had a dominant circumflex system with an 80% distal AV groove circumflex as well as a nondominant RCA with normal LV function at that time.  He did have a VF arrest and underwent right and left heart cath by Dr. Haroldine Laws 08/29/2019 revealing a patent LAD stent with an occluded second marginal branch with Dr. Angelena Form was unable to open.  He had an occluded nondominant RCA.  His EF was 20% at that time.  He currently denies chest pain or shortness of breath.  He does not appear to be on antiplatelet therapy. ?

## 2021-05-03 NOTE — Assessment & Plan Note (Signed)
History of hyperlipidemia on statin therapy with lipid profile performed 10/01/2019 revealing a total cholesterol 123, LDL 61 and HDL 47.  We will recheck a fasting lipid liver profile today. ?

## 2021-05-03 NOTE — Assessment & Plan Note (Signed)
History of peripheral arterial disease status post remote aortobifemoral bypass grafting.  We will recheck lower extremity arterial Doppler studies. ?

## 2021-05-03 NOTE — Progress Notes (Signed)
05/03/2021 Peter Lambert   [birth year]  [id number]  Primary Physician Redmond School, MD Primary Cardiologist: Lorretta Harp MD FACP, [place], [place], [place]  HPI:  Peter Lambert is a 76 y.o.  mildly overweight divorced African American male, father of 2, grandfather to 4 grandchildren, who I last  saw in the office 09/30/2019.  He is accompanied by his son Peter Lambert today.  He had a non-ST-segment-elevation myocardial infarction in April 2010. I catheterized him revealing high-grade proximal LAD stenosis, which I stented using a 2.75 x 18 mm long Xience V drug-eluting stent. I did jail the first diagonal branch, performed angioplasty at the ostium, reducing an 80% stenosis to less than 40%. He had a dominant circumflex system with an 80% distal AV groove circumflex as well as a nondominant RCA. He had normal LV function. Abdominal aortogram revealed a patent aortobifemoral bypass graft, which was placed approximately 16 years ago. He has known occluded SFAs bilaterally with ABIs that run in the 0.5 or 0.6 range. He denies chest pain, shortness of breath, or claudication. He continues to smoke 3 packs of cigarettes per week despite counseling to the contrary. He did stop smoking for a while and then restarted back and a third pack a day.    He was admitted to the hospital on 04/23/2019 with acute respiratory failure.  He did leave AMA but came back and was discharged on 04/28/2019.  He had community-acquired pneumonia, COPD exacerbation and diastolic dysfunction.  He was diuresed from an initial weight of 175 down to 161 at discharge.  He has gained a moderate amount of weight since that time.  We talked about the importance of salt restriction and dietary modification.  He still is on 3 L of nasal oxygen which will be adjusted by his primary care provider.   He was admitted to [facility] on 08/29/2019 and was discharged 1 week later.  He had community-acquired pneumonia and had VF arrest which was  witnessed and resuscitated.  He underwent right left heart cath by Dr. Haroldine Laws revealing a patent proximal LAD stent and an occluded second marginal branch which Dr. Angelena Form was unable to open up.  His initial EF was 20% which improved to normal at the time of cath.  He was documented to have brief A. fib/a flutter and was placed on amiodarone and Eliquis oral anticoagulation.   Since I saw him a year and a half ago he has done well.  He denies chest pain or shortness of breath.  He was recently seen in the emergency room with severe anemia and apparently was transfused.  The etiology was never determined.  m.  He last smoked back in January.  He currently denies chest pain or shortness of breath.     Current Meds  Medication Sig   acetaminophen (TYLENOL) 325 MG tablet Take 2 tablets (650 mg total) by mouth every 6 (six) hours as needed for mild pain, fever or headache (or Fever >/= 101).   albuterol (VENTOLIN HFA) 108 (90 Base) MCG/ACT inhaler Inhale 2 puffs into the lungs every 4 (four) hours as needed for wheezing or shortness of breath.   apixaban (ELIQUIS) 5 MG TABS tablet Take 1 tablet (5 mg total) by mouth 2 (two) times daily.   atorvastatin (LIPITOR) 80 MG tablet Take 80 mg by mouth daily.   [employer]S GENTLE LAXATIVE 5 MG EC tablet TAKE 1 TABLET BY MOUTH DAILY AS NEEDED FOR MODERATE CONSTIPATION. TAKE TWO DAYS  BEFORE PROCEDURE (Patient taking differently: Take 5 mg by mouth daily as needed.)   cyanocobalamin (,VITAMIN B-12,) 1000 MCG/ML injection Inject 1,000 mcg into the muscle every 30 (thirty) days.   cyanocobalamin 1000 MCG tablet Take 1,000 mcg by mouth daily.   ferrous sulfate (FERROUSUL) 325 (65 FE) MG tablet Take 1 tablet (325 mg total) by mouth daily with breakfast.   furosemide (LASIX) 20 MG tablet Take 20 mg by mouth daily.   guaiFENesin (MUCINEX) 600 MG 12 hr tablet Take 600 mg by mouth 2 (two) times daily as needed for cough or to loosen phlegm.   losartan (COZAAR) 25 MG tablet  Take 25 mg by mouth daily.   nitroGLYCERIN (NITROSTAT) 0.4 MG SL tablet Place 1 tablet (0.4 mg total) under the tongue every 5 (five) minutes as needed for chest pain. Patient must keep upcoming appointment for further refills   omeprazole (PRILOSEC) 20 MG capsule Take 20 mg by mouth daily.   potassium chloride SA (KLOR-CON) 20 MEQ tablet Take 20 mEq by mouth daily.   spironolactone (ALDACTONE) 50 MG tablet Take 50 mg by mouth daily.     No Known Allergies  Social History   Socioeconomic History   Marital status: Divorced    Spouse name: Not on file   Number of children: Not on file   Years of education: Not on file   Highest education level: Not on file  Occupational History   Not on file  Tobacco Use   Smoking status: Former    Packs/day: 0.50    Years: 52.00    Pack years: 26.00    Types: Cigarettes   Smokeless tobacco: Never  Vaping Use   Vaping Use: Never used  Substance and Sexual Activity   Alcohol use: No   Drug use: No   Sexual activity: Never  Other Topics Concern   Not on file  Social History Narrative   Not on file   Social Determinants of Health   Financial Resource Strain: Not on file  Food Insecurity: Not on file  Transportation Needs: Not on file  Physical Activity: Not on file  Stress: Not on file  Social Connections: Not on file  Intimate Partner Violence: Not on file     Review of Systems: General: negative for chills, fever, night sweats or weight changes.  Cardiovascular: negative for chest pain, dyspnea on exertion, edema, orthopnea, palpitations, paroxysmal nocturnal dyspnea or shortness of breath Dermatological: negative for rash Respiratory: negative for cough or wheezing Urologic: negative for hematuria Abdominal: negative for nausea, vomiting, diarrhea, bright red blood per rectum, melena, or hematemesis Neurologic: negative for visual changes, syncope, or dizziness All other systems reviewed and are otherwise negative except as noted  above.    Blood pressure 140/77, pulse 66, height 5\' 8"  (1.727 m), weight 167 lb 9.6 oz (76 kg), SpO2 98 %.  General appearance: alert and no distress Neck: no adenopathy, no JVD, supple, symmetrical, trachea midline, thyroid not enlarged, symmetric, no tenderness/mass/nodules, and soft bilateral carotid bruits Lungs: clear to auscultation bilaterally Heart: regular rate and rhythm, S1, S2 normal, no murmur, click, rub or gallop Extremities: extremities normal, atraumatic, no cyanosis or edema Pulses: 2+ and symmetric Skin: Skin color, texture, turgor normal. No rashes or lesions Neurologic: Grossly normal  EKG sinus rhythm at 66 with right bundle branch block.  I personally reviewed this EKG.  ASSESSMENT AND PLAN:   Peripheral arterial disease ([place]) History of peripheral arterial disease status post remote aortobifemoral bypass grafting.  We will recheck lower extremity arterial Doppler studies.  Coronary artery disease s/p DES to LAD in 2010, s/p PCI to proximal LAD stenosis with DES this hospitalization History of CAD status post non-STEMI 4/10.  I catheterized him revealing a high-grade proximal LAD stenosis which I stented using a 2.75 x 18 mm long Xience 5 drug-eluting stent.  I did GL the first diagonal branch and performed angioplasty on the ostium through the stent struts.  He had a dominant circumflex system with an 80% distal AV groove circumflex as well as a nondominant RCA with normal LV function at that time.  He did have a VF arrest and underwent right and left heart cath by Dr. Haroldine Laws 08/29/2019 revealing a patent LAD stent with an occluded second marginal branch with Dr. Angelena Form was unable to open.  He had an occluded nondominant RCA.  His EF was 20% at that time.  He currently denies chest pain or shortness of breath.  He does not appear to be on antiplatelet therapy.  Essential hypertension History of essential hypertension blood pressure measured today at 140/77.  He is  on losartan and carvedilol.  Hyperlipidemia History of hyperlipidemia on statin therapy with lipid profile performed 10/01/2019 revealing a total cholesterol 123, LDL 61 and HDL 47.  We will recheck a fasting lipid liver profile today.  Tobacco use Discontinue tobacco abuse 2 years ago.  ICD (implantable cardioverter-defibrillator) in place Patient had an ICD implanted by Dr. Lovena Le 09/04/2019 in the setting of VF arrest.  We will arrange for him to see Dr. Lovena Le back in follow-up.  He has had a recent GI bleed and was on apixaban because of brief A-fib.  Hopefully Dr. Lovena Le will be able to review his ICD download to see whether he had any further A-fib and decide whether or not to discontinue his oral anticoagulation.  Acute systolic CHF (congestive heart failure) (HCC) History of severe LV dysfunction by 2D echo performed 08/30/2019 with an EF of 20%.  He has not had this reassessed.  We will recheck a 2D echocardiogram.     Lorretta Harp MD [facility], [facility] 05/03/2021 11:19 AM

## 2021-05-03 NOTE — Assessment & Plan Note (Signed)
Discontinue tobacco abuse 2 years ago. ?

## 2021-05-03 NOTE — Assessment & Plan Note (Signed)
History of essential hypertension blood pressure measured today at 140/77.  He is on losartan and carvedilol. ?

## 2021-05-03 NOTE — Assessment & Plan Note (Signed)
Patient had an ICD implanted by Dr. Lovena Le 09/04/2019 in the setting of VF arrest.  We will arrange for him to see Dr. Lovena Le back in follow-up.  He has had a recent GI bleed and was on apixaban because of brief A-fib.  Hopefully Dr. Lovena Le will be able to review his ICD download to see whether he had any further A-fib and decide whether or not to discontinue his oral anticoagulation. ?

## 2021-05-03 NOTE — Patient Instructions (Signed)
Medication Instructions:  ?Your physician recommends that you continue on your current medications as directed. Please refer to the Current Medication list given to you today. ? ?*If you need a refill on your cardiac medications before your next appointment, please call your pharmacy* ? ? ?Lab Work: ?Your physician recommends that you have labs drawn today: Lipid/liver profile. ? ?If you have labs (blood work) drawn today and your tests are completely normal, you will receive your results only by: ?MyChart Message (if you have MyChart) OR ?A paper copy in the mail ?If you have any lab test that is abnormal or we need to change your treatment, we will call you to review the results. ? ? ?Testing/Procedures: ?Your physician has requested that you have an echocardiogram. Echocardiography is a painless test that uses sound waves to create images of your heart. It provides your doctor with information about the size and shape of your heart and how well your heart?s chambers and valves are working. This procedure takes approximately one hour. There are no restrictions for this procedure. This procedure is done at [address] ? ?Your physician has requested that you have a carotid duplex. This test is an ultrasound of the carotid arteries in your neck. It looks at blood flow through these arteries that supply the brain with blood. Allow one hour for this exam. There are no restrictions or special instructions. ? ?Your physician has requested that you have a lower extremity arterial duplex. This test is an ultrasound of the arteries in the legs. It looks at arterial blood flow in the legs. Allow one hour for Lower Arterial scans. There are no restrictions or special instructions ? ?Your physician has requested that you have an ankle brachial index (ABI). During this test an ultrasound and blood pressure cuff are used to evaluate the arteries that supply the arms and legs with blood. Allow thirty minutes for  this exam. There are no restrictions or special instructions. ?These procedures will be done at [place] ? ? ?Follow-Up: ?At [facility], you and your health needs are our priority.  As part of our continuing mission to provide you with exceptional heart care, we have created designated Provider Care Teams.  These Care Teams include your primary Cardiologist (physician) and Advanced Practice Providers (APPs -  Physician Assistants and Nurse Practitioners) who all work together to provide you with the care you need, when you need it. ? ?We recommend signing up for the patient portal called "MyChart".  Sign up information is provided on this After Visit Summary.  MyChart is used to connect with patients for Virtual Visits (Telemedicine).  Patients are able to view lab/test results, encounter notes, upcoming appointments, etc.  Non-urgent messages can be sent to your provider as well.   ?To learn more about what you can do with MyChart, go to [email or web address].   ? ?Your next appointment:   ?12 month(s) ? ?The format for your next appointment:   ?In Person ? ?Provider:   ?Quay Burow, MD  ? ? ?Other Instructions ?Pt needs an office visit with Dr. Lovena Le.  ? ?

## 2021-05-03 NOTE — Assessment & Plan Note (Signed)
History of severe LV dysfunction by 2D echo performed 08/30/2019 with an EF of 20%.  He has not had this reassessed.  We will recheck a 2D echocardiogram. ?

## 2021-05-04 ENCOUNTER — Other Ambulatory Visit (INDEPENDENT_AMBULATORY_CARE_PROVIDER_SITE_OTHER): Payer: Self-pay | Admitting: *Deleted

## 2021-05-04 DIAGNOSIS — Z862 Personal history of diseases of the blood and blood-forming organs and certain disorders involving the immune mechanism: Secondary | ICD-10-CM

## 2021-05-09 ENCOUNTER — Encounter (HOSPITAL_COMMUNITY): Payer: Self-pay | Admitting: Cardiovascular Disease

## 2021-05-22 ENCOUNTER — Other Ambulatory Visit (INDEPENDENT_AMBULATORY_CARE_PROVIDER_SITE_OTHER): Payer: Self-pay | Admitting: Internal Medicine

## 2021-05-23 ENCOUNTER — Telehealth (HOSPITAL_COMMUNITY): Payer: Self-pay | Admitting: Cardiovascular Disease

## 2021-05-23 NOTE — Telephone Encounter (Signed)
Just an FYI. ?We have made several attempts to contact this patient including sending a letter to schedule or reschedule their echocardiogram. We will be removing the patient from the echo WQ. ? ? ? ?05/09/21 MAILED LETTER [facility]  ?05/09/21 Called and spoke with patient and he hung up on me again and said that " he spoke to someone yesterday at the office"/[facility]  ?05/04/21 called patient to schedule and he hung up on me. I called again and he hung up on me again @ 12:24/[facility]  ?05/03/21 [facility] to schedule 1:20-pt was in the office this am and not scheduled/[facility] ? ? ? ?Thank you  ?

## 2021-05-24 DIAGNOSIS — E663 Overweight: Secondary | ICD-10-CM | POA: Diagnosis not present

## 2021-05-24 DIAGNOSIS — Z6825 Body mass index (BMI) 25.0-25.9, adult: Secondary | ICD-10-CM | POA: Diagnosis not present

## 2021-05-24 DIAGNOSIS — J22 Unspecified acute lower respiratory infection: Secondary | ICD-10-CM | POA: Diagnosis not present

## 2021-05-24 DIAGNOSIS — J449 Chronic obstructive pulmonary disease, unspecified: Secondary | ICD-10-CM | POA: Diagnosis not present

## 2021-05-26 DIAGNOSIS — N183 Chronic kidney disease, stage 3 unspecified: Secondary | ICD-10-CM | POA: Diagnosis not present

## 2021-05-26 DIAGNOSIS — J449 Chronic obstructive pulmonary disease, unspecified: Secondary | ICD-10-CM | POA: Diagnosis not present

## 2021-05-26 DIAGNOSIS — E1122 Type 2 diabetes mellitus with diabetic chronic kidney disease: Secondary | ICD-10-CM | POA: Diagnosis not present

## 2021-05-26 DIAGNOSIS — E782 Mixed hyperlipidemia: Secondary | ICD-10-CM | POA: Diagnosis not present

## 2021-05-30 ENCOUNTER — Encounter: Payer: Self-pay | Admitting: Internal Medicine

## 2021-05-30 ENCOUNTER — Ambulatory Visit: Payer: PPO | Admitting: Internal Medicine

## 2021-05-30 VITALS — BP 98/72 | HR 62 | Ht 68.0 in | Wt 165.6 lb

## 2021-05-30 DIAGNOSIS — I4901 Ventricular fibrillation: Secondary | ICD-10-CM

## 2021-05-30 NOTE — Patient Instructions (Signed)
Medication Instructions:  Your physician recommends that you continue on your current medications as directed. Please refer to the Current Medication list given to you today.  *If you need a refill on your cardiac medications before your next appointment, please call your pharmacy*   Lab Work: NONE   If you have labs (blood work) drawn today and your tests are completely normal, you will receive your results only by: . MyChart Message (if you have MyChart) OR . A paper copy in the mail If you have any lab test that is abnormal or we need to change your treatment, we will call you to review the results.   Testing/Procedures: NONE    Follow-Up: At [facility], you and your health needs are our priority.  As part of our continuing mission to provide you with exceptional heart care, we have created designated Provider Care Teams.  These Care Teams include your primary Cardiologist (physician) and Advanced Practice Providers (APPs -  Physician Assistants and Nurse Practitioners) who all work together to provide you with the care you need, when you need it.  We recommend signing up for the patient portal called "MyChart".  Sign up information is provided on this After Visit Summary.  MyChart is used to connect with patients for Virtual Visits (Telemedicine).  Patients are able to view lab/test results, encounter notes, upcoming appointments, etc.  Non-urgent messages can be sent to your provider as well.   To learn more about what you can do with MyChart, go to [email or web address].    Your next appointment:   1 year(s)  The format for your next appointment:   In Person  Provider:   Gregg Taylor, MD   Other Instructions Thank you for choosing [facility]!    

## 2021-05-30 NOTE — Progress Notes (Signed)
? ? ? ? ?HPI ?Peter Lambert returns after being treated for a VF arrest a couple of months ago. He is s/p ICD insertion. He denies chest pain or sob. Since ICD insertion, he denies chest pain or sob. He has some leg pain with exertion and has known ax/bifem disease. He has not had any problems with his ICD therapies. ?No Known Allergies ? ? ?Current Outpatient Medications  ?Medication Sig Dispense Refill  ? acetaminophen (TYLENOL) 325 MG tablet Take 2 tablets (650 mg total) by mouth every 6 (six) hours as needed for mild pain, fever or headache (or Fever >/= 101). 30 tablet 0  ? albuterol (VENTOLIN HFA) 108 (90 Base) MCG/ACT inhaler Inhale 2 puffs into the lungs every 4 (four) hours as needed for wheezing or shortness of breath. 18 g 3  ? amiodarone (PACERONE) 100 MG tablet Take 1 tablet (100 mg total) by mouth daily. 30 tablet 0  ? apixaban (ELIQUIS) 5 MG TABS tablet Take 1 tablet (5 mg total) by mouth 2 (two) times daily. 60 tablet 5  ? atorvastatin (LIPITOR) 80 MG tablet Take 80 mg by mouth daily.    ? carvedilol (COREG) 3.125 MG tablet Take 1 tablet (3.125 mg total) by mouth daily with breakfast. 30 tablet 0  ? [facility] GENTLE LAXATIVE 5 MG EC tablet TAKE 1 TABLET BY MOUTH DAILY AS NEEDED FOR MODERATE CONSTIPATION. TAKE TWO DAYS BEFORE PROCEDURE (Patient taking differently: Take 5 mg by mouth daily as needed.) 30 tablet 0  ? cyanocobalamin (,VITAMIN B-12,) 1000 MCG/ML injection Inject 1,000 mcg into the muscle every 30 (thirty) days.    ? cyanocobalamin 1000 MCG tablet Take 1,000 mcg by mouth daily.    ? ferrous sulfate (FERROUSUL) 325 (65 FE) MG tablet Take 1 tablet (325 mg total) by mouth daily with breakfast.  0  ? furosemide (LASIX) 20 MG tablet Take 20 mg by mouth daily.    ? guaiFENesin (MUCINEX) 600 MG 12 hr tablet Take 600 mg by mouth 2 (two) times daily as needed for cough or to loosen phlegm.    ? losartan (COZAAR) 25 MG tablet Take 25 mg by mouth daily.    ? nitroGLYCERIN (NITROSTAT) 0.4 MG SL tablet Place 1  tablet (0.4 mg total) under the tongue every 5 (five) minutes as needed for chest pain. Patient must keep upcoming appointment for further refills 75 tablet 1  ? omeprazole (PRILOSEC) 20 MG capsule Take 20 mg by mouth daily.    ? potassium chloride SA (KLOR-CON) 20 MEQ tablet Take 20 mEq by mouth daily.    ? spironolactone (ALDACTONE) 50 MG tablet Take 50 mg by mouth daily.    ? ?No current facility-administered medications for this visit.  ? ? ? ?Past Medical History:  ?Diagnosis Date  ? Acute upper GI bleed   ? Coronary artery disease   ? a. s/p NSTEMI in 2010 with DES to proximal LAD with D1 jailed and angioplasty alone to ostium  ? Hyperlipidemia   ? Hypertension   ? Hypertension   ? Peripheral arterial disease ([place])   ? Tobacco abuse   ? Type 2 diabetes mellitus ([place])   ? ? ?ROS: ? ? All systems reviewed and negative except as noted in the HPI. ? ? ?Past Surgical History:  ?Procedure Laterality Date  ? CARDIAC SURGERY    ? COLONOSCOPY WITH PROPOFOL N/A 01/08/2020  ? Procedure: COLONOSCOPY WITH PROPOFOL;  Surgeon: Eloise [name], DO;  [facility];  Service: Endoscopy;  Laterality: N/A;  ? COLONOSCOPY WITH PROPOFOL N/A 05/03/2020  ? Hung:entire examined colon normal  ? CORONARY STENT INTERVENTION N/A 09/03/2019  ? Procedure: CORONARY STENT INTERVENTION;  Surgeon: Burnell Blanks, MD;  Location: [place] [facility];  Service: Cardiovascular;  Laterality: N/A;  ? CORONARY STENT PLACEMENT    ? ENTEROSCOPY N/A 05/03/2020  ? hung:normal esophagus, normal stomach. Three non-bleeding angiodysplastic lesions in the duodenum. Treated with a monopolar probe. A few non-bleeding angiodysplastic lesions in the jejunum. Treated with a monopolar probe. No specimens collected.  ? ESOPHAGOGASTRODUODENOSCOPY (EGD) WITH PROPOFOL N/A 01/06/2020  ? Procedure: ESOPHAGOGASTRODUODENOSCOPY (EGD) WITH PROPOFOL;  Surgeon: Rogene Houston, MD;  [facility];  Service: Endoscopy;  Laterality: N/A;  ? HOT  HEMOSTASIS N/A 05/03/2020  ? Procedure: HOT HEMOSTASIS (ARGON PLASMA COAGULATION/BICAP);  Surgeon: Carol Ada, MD;  Location: [facility];  Service: Gastroenterology;  Laterality: N/A;  ? ICD IMPLANT N/A 09/04/2019  ? Procedure: ICD IMPLANT;  Surgeon: Evans Lance, MD;  Location: [place] [facility];  Service: Cardiovascular;  Laterality: N/A;  ? RIGHT/LEFT HEART CATH AND CORONARY ANGIOGRAPHY N/A 09/03/2019  ? Procedure: RIGHT/LEFT HEART CATH AND CORONARY ANGIOGRAPHY;  Surgeon: Jolaine Artist, MD;  Location: [place] [facility];  Service: Cardiovascular;  Laterality: N/A;  ? ? ? ?Family History  ?Problem Relation Age of Onset  ? Cancer Mother   ?     not sure location   ? Colon cancer Neg Hx   ? Stomach cancer Neg Hx   ? ? ? ?Social History  ? ?Socioeconomic History  ? Marital status: Divorced  ?  Spouse name: Not on file  ? Number of children: Not on file  ? Years of education: Not on file  ? Highest education level: Not on file  ?Occupational History  ? Not on file  ?Tobacco Use  ? Smoking status: Former  ?  Packs/day: 0.50  ?  Years: 52.00  ?  Pack years: 26.00  ?  Types: Cigarettes  ? Smokeless tobacco: Never  ?Vaping Use  ? Vaping Use: Never used  ?Substance and Sexual Activity  ? Alcohol use: No  ? Drug use: No  ? Sexual activity: Never  ?Other Topics Concern  ? Not on file  ?Social History Narrative  ? Not on file  ? ?Social Determinants of Health  ? ?Financial Resource Strain: Not on file  ?Food Insecurity: Not on file  ?Transportation Needs: Not on file  ?Physical Activity: Not on file  ?Stress: Not on file  ?Social Connections: Not on file  ?Intimate Partner Violence: Not on file  ? ? ? ?BP 98/72   Pulse 62   Ht 5\' 8"  (1.727 m)   Wt 165 lb 9.6 oz (75.1 kg)   SpO2 94%   BMI 25.18 kg/m?  ? ?Physical Exam: ? ?Well appearing NAD ?HEENT: Unremarkable ?Neck:  No JVD, no thyromegally ?Lymphatics:  No adenopathy ?Back:  No CVA tenderness ?Lungs:  Clear ?HEART:  Regular rate rhythm, no murmurs, no  rubs, no clicks ?Abd:  soft, positive bowel sounds, no organomegally, no rebound, no guarding ?Ext:  2 plus pulses, no edema, no cyanosis, no clubbing ?Skin:  No rashes no nodules ?Neuro:  CN II through XII intact, motor grossly intact ? ? ?DEVICE  ?Normal [place] Sci ICD function.  See PaceArt for details.  ? ?Assess/Plan:  ?1. VF arrest - he has had no recurrent episodes. No change in meds. He will continue low dose amiodarone. ?2.  NSTEMI - He denies anginal symptoms. No change in meds. ?3. PAD - he has minimal claudication. He will continue his current meds. ?4. HTN - his bp is down. He will continue his current meds. ?  ?Carleene Overlie [place],MD ?

## 2021-05-31 ENCOUNTER — Other Ambulatory Visit (INDEPENDENT_AMBULATORY_CARE_PROVIDER_SITE_OTHER): Payer: Self-pay | Admitting: *Deleted

## 2021-05-31 DIAGNOSIS — E538 Deficiency of other specified B group vitamins: Secondary | ICD-10-CM | POA: Diagnosis not present

## 2021-05-31 DIAGNOSIS — Z862 Personal history of diseases of the blood and blood-forming organs and certain disorders involving the immune mechanism: Secondary | ICD-10-CM

## 2021-06-01 ENCOUNTER — Encounter (HOSPITAL_COMMUNITY): Payer: PPO

## 2021-06-05 ENCOUNTER — Ambulatory Visit (INDEPENDENT_AMBULATORY_CARE_PROVIDER_SITE_OTHER): Payer: PPO

## 2021-06-05 DIAGNOSIS — I469 Cardiac arrest, cause unspecified: Secondary | ICD-10-CM

## 2021-06-05 DIAGNOSIS — I4901 Ventricular fibrillation: Secondary | ICD-10-CM | POA: Diagnosis not present

## 2021-06-06 LAB — CUP PACEART REMOTE DEVICE CHECK
Battery Remaining Longevity: 138 mo
Battery Remaining Percentage: 100 %
Brady Statistic RA Percent Paced: 61 %
Brady Statistic RV Percent Paced: 0 %
Date Time Interrogation Session: 20230410073300
HighPow Impedance: 76 Ohm
Implantable Lead Implant Date: 20210709
Implantable Lead Implant Date: 20210709
Implantable Lead Location: 753859
Implantable Lead Location: 753860
Implantable Lead Model: 137
Implantable Lead Model: 7841
Implantable Lead Serial Number: 1063971
Implantable Lead Serial Number: 300935
Implantable Pulse Generator Implant Date: 20210709
Lead Channel Impedance Value: 441 Ohm
Lead Channel Impedance Value: 734 Ohm
Lead Channel Setting Pacing Amplitude: 2 V
Lead Channel Setting Pacing Amplitude: 2.2 V
Lead Channel Setting Pacing Pulse Width: 0.4 ms
Lead Channel Setting Sensing Sensitivity: 0.5 mV
Pulse Gen Serial Number: 224993

## 2021-06-07 DIAGNOSIS — Z862 Personal history of diseases of the blood and blood-forming organs and certain disorders involving the immune mechanism: Secondary | ICD-10-CM | POA: Diagnosis not present

## 2021-06-07 LAB — CBC WITH DIFFERENTIAL/PLATELET
Absolute Monocytes: 1010 cells/uL — ABNORMAL HIGH (ref 200–950)
Basophils Absolute: 91 cells/uL (ref 0–200)
Basophils Relative: 1 %
Eosinophils Absolute: 237 cells/uL (ref 15–500)
Eosinophils Relative: 2.6 %
HCT: 33 % — ABNORMAL LOW (ref 38.5–50.0)
Hemoglobin: 10.9 g/dL — ABNORMAL LOW (ref 13.2–17.1)
Lymphs Abs: 1420 cells/uL (ref 850–3900)
MCH: 29.6 pg (ref 27.0–33.0)
MCHC: 33 g/dL (ref 32.0–36.0)
MCV: 89.7 fL (ref 80.0–100.0)
MPV: 11.9 fL (ref 7.5–12.5)
Monocytes Relative: 11.1 %
Neutro Abs: 6343 cells/uL (ref 1500–7800)
Neutrophils Relative %: 69.7 %
Platelets: 166 10*3/uL (ref 140–400)
RBC: 3.68 10*6/uL — ABNORMAL LOW (ref 4.20–5.80)
RDW: 14.6 % (ref 11.0–15.0)
Total Lymphocyte: 15.6 %
WBC: 9.1 10*3/uL (ref 3.8–10.8)

## 2021-06-19 ENCOUNTER — Encounter (HOSPITAL_COMMUNITY): Payer: PPO

## 2021-06-20 NOTE — Progress Notes (Signed)
Remote ICD transmission.   

## 2021-06-30 ENCOUNTER — Ambulatory Visit (HOSPITAL_COMMUNITY)
Admission: RE | Admit: 2021-06-30 | Discharge: 2021-06-30 | Disposition: A | Discharge status: Home / Self Care | Payer: PPO | Source: Ambulatory Visit | Attending: Cardiovascular Disease | Admitting: Cardiovascular Disease

## 2021-06-30 ENCOUNTER — Ambulatory Visit (HOSPITAL_BASED_OUTPATIENT_CLINIC_OR_DEPARTMENT_OTHER)
Admission: RE | Admit: 2021-06-30 | Discharge: 2021-06-30 | Disposition: A | Discharge status: Home / Self Care | Payer: PPO | Source: Ambulatory Visit | Attending: Cardiovascular Disease | Admitting: Cardiovascular Disease

## 2021-06-30 ENCOUNTER — Other Ambulatory Visit (HOSPITAL_COMMUNITY): Payer: Self-pay | Admitting: Cardiovascular Disease

## 2021-06-30 DIAGNOSIS — Z720 Tobacco use: Secondary | ICD-10-CM | POA: Insufficient documentation

## 2021-06-30 DIAGNOSIS — I739 Peripheral vascular disease, unspecified: Secondary | ICD-10-CM

## 2021-06-30 DIAGNOSIS — I2510 Atherosclerotic heart disease of native coronary artery without angina pectoris: Secondary | ICD-10-CM | POA: Insufficient documentation

## 2021-06-30 DIAGNOSIS — I6522 Occlusion and stenosis of left carotid artery: Secondary | ICD-10-CM | POA: Diagnosis not present

## 2021-06-30 DIAGNOSIS — E782 Mixed hyperlipidemia: Secondary | ICD-10-CM | POA: Insufficient documentation

## 2021-06-30 DIAGNOSIS — Z95820 Peripheral vascular angioplasty status with implants and grafts: Secondary | ICD-10-CM

## 2021-07-05 DIAGNOSIS — E538 Deficiency of other specified B group vitamins: Secondary | ICD-10-CM | POA: Diagnosis not present

## 2021-07-10 ENCOUNTER — Other Ambulatory Visit (INDEPENDENT_AMBULATORY_CARE_PROVIDER_SITE_OTHER): Payer: Self-pay | Admitting: *Deleted

## 2021-07-10 ENCOUNTER — Telehealth (INDEPENDENT_AMBULATORY_CARE_PROVIDER_SITE_OTHER): Payer: Self-pay | Admitting: *Deleted

## 2021-07-10 DIAGNOSIS — Z862 Personal history of diseases of the blood and blood-forming organs and certain disorders involving the immune mechanism: Secondary | ICD-10-CM

## 2021-07-11 NOTE — Telephone Encounter (Signed)
error 

## 2021-07-12 DIAGNOSIS — Z862 Personal history of diseases of the blood and blood-forming organs and certain disorders involving the immune mechanism: Secondary | ICD-10-CM | POA: Diagnosis not present

## 2021-07-12 LAB — HEMOGLOBIN AND HEMATOCRIT, BLOOD
HCT: 31.5 % — ABNORMAL LOW (ref 38.5–50.0)
Hemoglobin: 10.6 g/dL — ABNORMAL LOW (ref 13.2–17.1)

## 2021-07-14 DIAGNOSIS — Z87891 Personal history of nicotine dependence: Secondary | ICD-10-CM | POA: Diagnosis not present

## 2021-07-14 DIAGNOSIS — I252 Old myocardial infarction: Secondary | ICD-10-CM | POA: Diagnosis not present

## 2021-07-14 DIAGNOSIS — Z955 Presence of coronary angioplasty implant and graft: Secondary | ICD-10-CM | POA: Diagnosis not present

## 2021-07-14 DIAGNOSIS — I25118 Atherosclerotic heart disease of native coronary artery with other forms of angina pectoris: Secondary | ICD-10-CM | POA: Diagnosis not present

## 2021-07-14 DIAGNOSIS — I5032 Chronic diastolic (congestive) heart failure: Secondary | ICD-10-CM | POA: Diagnosis not present

## 2021-07-14 DIAGNOSIS — E785 Hyperlipidemia, unspecified: Secondary | ICD-10-CM | POA: Diagnosis not present

## 2021-07-14 DIAGNOSIS — Z515 Encounter for palliative care: Secondary | ICD-10-CM | POA: Diagnosis not present

## 2021-07-14 DIAGNOSIS — I110 Hypertensive heart disease with heart failure: Secondary | ICD-10-CM | POA: Diagnosis not present

## 2021-07-18 ENCOUNTER — Telehealth: Payer: Self-pay | Admitting: Cardiovascular Disease

## 2021-07-18 ENCOUNTER — Other Ambulatory Visit: Payer: Self-pay | Admitting: Cardiovascular Disease

## 2021-07-18 NOTE — Telephone Encounter (Signed)
*  STAT* If patient is at the pharmacy, call can be transferred to refill team.   1. Which medications need to be refilled? (please list name of each medication and dose if known) Eliquis  2. Which pharmacy/location (including street and city if local pharmacy) is [facility] to [facility] sent to? [facility] [address], [place]  3. Do they need a 30 day or 90 day supply? 90 days and refills- completely out of it

## 2021-07-18 NOTE — Telephone Encounter (Signed)
*  STAT* If patient is at the pharmacy, call can be transferred to refill team.   1. Which medications need to be refilled? (please list name of each medication and dose if known)  apixaban (ELIQUIS) 5 MG TABS tablet nitroGLYCERIN (NITROSTAT) 0.4 MG SL tablet  2. Which pharmacy/location (including street and city if local pharmacy) is medication to be sent to? [id number] [place] [place]  3. Do they need a 30 day or 90 day supply?  90 day supply  Patient states he is completely out medication.

## 2021-07-19 NOTE — Telephone Encounter (Signed)
Prescription refill request for Eliquis received. Indication:Afib Last office visit:4/23 Scr:1.7 Age: 76 Weight:75.1 kg  Prescription refilled

## 2021-07-20 MED ORDER — NITROGLYCERIN 0.4 MG SL SUBL: 0.4000 mg | SUBLINGUAL_TABLET | SUBLINGUAL | 12 refills | Status: DC | PRN

## 2021-07-20 MED ORDER — APIXABAN 5 MG PO TABS: 5.0000 mg | ORAL_TABLET | Freq: Two times a day (BID) | ORAL | 1 refills | Status: DC

## 2021-07-28 DIAGNOSIS — E538 Deficiency of other specified B group vitamins: Secondary | ICD-10-CM | POA: Diagnosis not present

## 2021-08-11 ENCOUNTER — Other Ambulatory Visit (INDEPENDENT_AMBULATORY_CARE_PROVIDER_SITE_OTHER): Payer: Self-pay | Admitting: *Deleted

## 2021-08-11 DIAGNOSIS — Z862 Personal history of diseases of the blood and blood-forming organs and certain disorders involving the immune mechanism: Secondary | ICD-10-CM

## 2021-08-14 ENCOUNTER — Other Ambulatory Visit (HOSPITAL_COMMUNITY): Payer: Self-pay | Admitting: Cardiovascular Disease

## 2021-08-14 DIAGNOSIS — I739 Peripheral vascular disease, unspecified: Secondary | ICD-10-CM

## 2021-08-14 DIAGNOSIS — I6523 Occlusion and stenosis of bilateral carotid arteries: Secondary | ICD-10-CM

## 2021-08-16 DIAGNOSIS — Z862 Personal history of diseases of the blood and blood-forming organs and certain disorders involving the immune mechanism: Secondary | ICD-10-CM | POA: Diagnosis not present

## 2021-08-16 LAB — CBC WITH DIFFERENTIAL/PLATELET
Absolute Monocytes: 844 cells/uL (ref 200–950)
Basophils Absolute: 67 cells/uL (ref 0–200)
Basophils Relative: 1 %
Eosinophils Absolute: 288 cells/uL (ref 15–500)
Eosinophils Relative: 4.3 %
HCT: 33 % — ABNORMAL LOW (ref 38.5–50.0)
Hemoglobin: 10.9 g/dL — ABNORMAL LOW (ref 13.2–17.1)
Lymphs Abs: 1293 cells/uL (ref 850–3900)
MCH: 29.8 pg (ref 27.0–33.0)
MCHC: 33 g/dL (ref 32.0–36.0)
MCV: 90.2 fL (ref 80.0–100.0)
MPV: 11.6 fL (ref 7.5–12.5)
Monocytes Relative: 12.6 %
Neutro Abs: 4208 cells/uL (ref 1500–7800)
Neutrophils Relative %: 62.8 %
Platelets: 193 10*3/uL (ref 140–400)
RBC: 3.66 10*6/uL — ABNORMAL LOW (ref 4.20–5.80)
RDW: 14.2 % (ref 11.0–15.0)
Total Lymphocyte: 19.3 %
WBC: 6.7 10*3/uL (ref 3.8–10.8)

## 2021-08-31 DIAGNOSIS — E538 Deficiency of other specified B group vitamins: Secondary | ICD-10-CM | POA: Diagnosis not present

## 2021-09-01 DIAGNOSIS — I5032 Chronic diastolic (congestive) heart failure: Secondary | ICD-10-CM | POA: Diagnosis not present

## 2021-09-01 DIAGNOSIS — Z515 Encounter for palliative care: Secondary | ICD-10-CM | POA: Diagnosis not present

## 2021-09-01 DIAGNOSIS — J449 Chronic obstructive pulmonary disease, unspecified: Secondary | ICD-10-CM | POA: Diagnosis not present

## 2021-09-01 DIAGNOSIS — Z6826 Body mass index (BMI) 26.0-26.9, adult: Secondary | ICD-10-CM | POA: Diagnosis not present

## 2021-09-01 DIAGNOSIS — Z87891 Personal history of nicotine dependence: Secondary | ICD-10-CM | POA: Diagnosis not present

## 2021-09-04 ENCOUNTER — Ambulatory Visit (INDEPENDENT_AMBULATORY_CARE_PROVIDER_SITE_OTHER): Payer: PPO

## 2021-09-04 DIAGNOSIS — I4901 Ventricular fibrillation: Secondary | ICD-10-CM

## 2021-09-04 LAB — CUP PACEART REMOTE DEVICE CHECK
Battery Remaining Longevity: 144 mo
Battery Remaining Percentage: 100 %
Brady Statistic RA Percent Paced: 33 %
Brady Statistic RV Percent Paced: 0 %
Date Time Interrogation Session: 20230710073300
HighPow Impedance: 77 Ohm
Implantable Lead Implant Date: 20210709
Implantable Lead Implant Date: 20210709
Implantable Lead Location: 753859
Implantable Lead Location: 753860
Implantable Lead Model: 137
Implantable Lead Model: 7841
Implantable Lead Serial Number: 1063971
Implantable Lead Serial Number: 300935
Implantable Pulse Generator Implant Date: 20210709
Lead Channel Impedance Value: 445 Ohm
Lead Channel Impedance Value: 701 Ohm
Lead Channel Setting Pacing Amplitude: 2 V
Lead Channel Setting Pacing Amplitude: 2.2 V
Lead Channel Setting Pacing Pulse Width: 0.4 ms
Lead Channel Setting Sensing Sensitivity: 0.5 mV
Pulse Gen Serial Number: 224993

## 2021-09-13 DIAGNOSIS — N183 Chronic kidney disease, stage 3 unspecified: Secondary | ICD-10-CM | POA: Diagnosis not present

## 2021-09-13 DIAGNOSIS — Z0001 Encounter for general adult medical examination with abnormal findings: Secondary | ICD-10-CM | POA: Diagnosis not present

## 2021-09-13 DIAGNOSIS — Z6825 Body mass index (BMI) 25.0-25.9, adult: Secondary | ICD-10-CM | POA: Diagnosis not present

## 2021-09-13 DIAGNOSIS — I2510 Atherosclerotic heart disease of native coronary artery without angina pectoris: Secondary | ICD-10-CM | POA: Diagnosis not present

## 2021-09-13 DIAGNOSIS — I4891 Unspecified atrial fibrillation: Secondary | ICD-10-CM | POA: Diagnosis not present

## 2021-09-13 DIAGNOSIS — E1122 Type 2 diabetes mellitus with diabetic chronic kidney disease: Secondary | ICD-10-CM | POA: Diagnosis not present

## 2021-09-13 DIAGNOSIS — E782 Mixed hyperlipidemia: Secondary | ICD-10-CM | POA: Diagnosis not present

## 2021-09-13 DIAGNOSIS — Z1331 Encounter for screening for depression: Secondary | ICD-10-CM | POA: Diagnosis not present

## 2021-09-13 DIAGNOSIS — E538 Deficiency of other specified B group vitamins: Secondary | ICD-10-CM | POA: Diagnosis not present

## 2021-09-13 DIAGNOSIS — F17200 Nicotine dependence, unspecified, uncomplicated: Secondary | ICD-10-CM | POA: Diagnosis not present

## 2021-09-13 DIAGNOSIS — I10 Essential (primary) hypertension: Secondary | ICD-10-CM | POA: Diagnosis not present

## 2021-09-13 DIAGNOSIS — J449 Chronic obstructive pulmonary disease, unspecified: Secondary | ICD-10-CM | POA: Diagnosis not present

## 2021-09-27 DIAGNOSIS — E538 Deficiency of other specified B group vitamins: Secondary | ICD-10-CM | POA: Diagnosis not present

## 2021-10-04 NOTE — Progress Notes (Signed)
Remote ICD transmission.   

## 2021-10-20 DIAGNOSIS — I482 Chronic atrial fibrillation, unspecified: Secondary | ICD-10-CM | POA: Diagnosis not present

## 2021-10-20 DIAGNOSIS — Z515 Encounter for palliative care: Secondary | ICD-10-CM | POA: Diagnosis not present

## 2021-10-20 DIAGNOSIS — D6869 Other thrombophilia: Secondary | ICD-10-CM | POA: Diagnosis not present

## 2021-10-20 DIAGNOSIS — Z7901 Long term (current) use of anticoagulants: Secondary | ICD-10-CM | POA: Diagnosis not present

## 2021-10-25 DIAGNOSIS — B360 Pityriasis versicolor: Secondary | ICD-10-CM | POA: Diagnosis not present

## 2021-10-25 DIAGNOSIS — E663 Overweight: Secondary | ICD-10-CM | POA: Diagnosis not present

## 2021-10-25 DIAGNOSIS — Z6825 Body mass index (BMI) 25.0-25.9, adult: Secondary | ICD-10-CM | POA: Diagnosis not present

## 2021-10-25 DIAGNOSIS — E1140 Type 2 diabetes mellitus with diabetic neuropathy, unspecified: Secondary | ICD-10-CM | POA: Diagnosis not present

## 2021-10-25 DIAGNOSIS — N183 Chronic kidney disease, stage 3 unspecified: Secondary | ICD-10-CM | POA: Diagnosis not present

## 2021-10-25 DIAGNOSIS — I4891 Unspecified atrial fibrillation: Secondary | ICD-10-CM | POA: Diagnosis not present

## 2021-10-25 DIAGNOSIS — I700 Atherosclerosis of aorta: Secondary | ICD-10-CM | POA: Diagnosis not present

## 2021-10-25 DIAGNOSIS — I509 Heart failure, unspecified: Secondary | ICD-10-CM | POA: Diagnosis not present

## 2021-11-03 DIAGNOSIS — Z23 Encounter for immunization: Secondary | ICD-10-CM | POA: Diagnosis not present

## 2021-11-03 DIAGNOSIS — E538 Deficiency of other specified B group vitamins: Secondary | ICD-10-CM | POA: Diagnosis not present

## 2021-11-26 ENCOUNTER — Emergency Department (HOSPITAL_COMMUNITY): Payer: PPO

## 2021-11-26 ENCOUNTER — Emergency Department (HOSPITAL_COMMUNITY)
Admission: EM | Admit: 2021-11-26 | Discharge: 2021-11-27 | Disposition: A | Discharge status: Home / Self Care | Payer: PPO | Attending: Emergency Medicine | Admitting: Emergency Medicine

## 2021-11-26 ENCOUNTER — Encounter (HOSPITAL_COMMUNITY): Payer: Self-pay | Admitting: Emergency Medicine

## 2021-11-26 DIAGNOSIS — E119 Type 2 diabetes mellitus without complications: Secondary | ICD-10-CM | POA: Insufficient documentation

## 2021-11-26 DIAGNOSIS — K5939 Other megacolon: Secondary | ICD-10-CM | POA: Diagnosis not present

## 2021-11-26 DIAGNOSIS — I2510 Atherosclerotic heart disease of native coronary artery without angina pectoris: Secondary | ICD-10-CM | POA: Insufficient documentation

## 2021-11-26 DIAGNOSIS — R42 Dizziness and giddiness: Secondary | ICD-10-CM | POA: Diagnosis not present

## 2021-11-26 DIAGNOSIS — I491 Atrial premature depolarization: Secondary | ICD-10-CM | POA: Diagnosis not present

## 2021-11-26 DIAGNOSIS — I10 Essential (primary) hypertension: Secondary | ICD-10-CM | POA: Insufficient documentation

## 2021-11-26 DIAGNOSIS — R5381 Other malaise: Secondary | ICD-10-CM | POA: Diagnosis not present

## 2021-11-26 DIAGNOSIS — Z955 Presence of coronary angioplasty implant and graft: Secondary | ICD-10-CM | POA: Insufficient documentation

## 2021-11-26 DIAGNOSIS — R55 Syncope and collapse: Secondary | ICD-10-CM | POA: Insufficient documentation

## 2021-11-26 DIAGNOSIS — Z87891 Personal history of nicotine dependence: Secondary | ICD-10-CM | POA: Insufficient documentation

## 2021-11-26 DIAGNOSIS — R140 Abdominal distension (gaseous): Secondary | ICD-10-CM | POA: Diagnosis not present

## 2021-11-26 DIAGNOSIS — K6389 Other specified diseases of intestine: Secondary | ICD-10-CM | POA: Diagnosis not present

## 2021-11-26 DIAGNOSIS — R9431 Abnormal electrocardiogram [ECG] [EKG]: Secondary | ICD-10-CM | POA: Diagnosis not present

## 2021-11-26 DIAGNOSIS — R1084 Generalized abdominal pain: Secondary | ICD-10-CM | POA: Diagnosis not present

## 2021-11-26 LAB — COMPREHENSIVE METABOLIC PANEL WITH GFR
ALT: 27 U/L (ref 0–44)
AST: 27 U/L (ref 15–41)
Albumin: 4 g/dL (ref 3.5–5.0)
Alkaline Phosphatase: 93 U/L (ref 38–126)
Anion gap: 12 (ref 5–15)
BUN: 34 mg/dL — ABNORMAL HIGH (ref 8–23)
CO2: 24 mmol/L (ref 22–32)
Calcium: 9.4 mg/dL (ref 8.9–10.3)
Chloride: 103 mmol/L (ref 98–111)
Creatinine, Ser: 2.24 mg/dL — ABNORMAL HIGH (ref 0.61–1.24)
GFR, Estimated: 29 mL/min — ABNORMAL LOW
Glucose, Bld: 137 mg/dL — ABNORMAL HIGH (ref 70–99)
Potassium: 4.8 mmol/L (ref 3.5–5.1)
Sodium: 139 mmol/L (ref 135–145)
Total Bilirubin: 0.6 mg/dL (ref 0.3–1.2)
Total Protein: 8.2 g/dL — ABNORMAL HIGH (ref 6.5–8.1)

## 2021-11-26 LAB — TYPE AND SCREEN
ABO/RH(D): B POS
Antibody Screen: NEGATIVE

## 2021-11-26 LAB — CBC
HCT: 31.5 % — ABNORMAL LOW (ref 39.0–52.0)
Hemoglobin: 10 g/dL — ABNORMAL LOW (ref 13.0–17.0)
MCH: 29.9 pg (ref 26.0–34.0)
MCHC: 31.7 g/dL (ref 30.0–36.0)
MCV: 94 fL (ref 80.0–100.0)
Platelets: 238 10*3/uL (ref 150–400)
RBC: 3.35 MIL/uL — ABNORMAL LOW (ref 4.22–5.81)
RDW: 13.2 % (ref 11.5–15.5)
WBC: 9.3 10*3/uL (ref 4.0–10.5)
nRBC: 0 % (ref 0.0–0.2)

## 2021-11-26 LAB — TROPONIN I (HIGH SENSITIVITY): Troponin I (High Sensitivity): 14 ng/L

## 2021-11-26 LAB — MAGNESIUM: Magnesium: 1.9 mg/dL (ref 1.7–2.4)

## 2021-11-26 NOTE — ED Provider Notes (Signed)
[facility] Emergency Department Provider Note MRN:  [id number]  Arrival date & time: 11/27/21     Chief Complaint   Near Syncope   History of Present Illness   Peter Lambert is a 76 y.o. year-old male with a history of peripheral artery disease, diabetes, CAD, GI bleeding presenting to the ED with chief complaint of near syncope.  Patient felt well yesterday, felt well when he woke up this morning.  Experience some general malaise, may be some abdominal bloating or discomfort at about 9 p.m. today.  Took a nitroglycerin tablet to see if this would help.  Shortly after he had a episode where he almost passed out according to his coworkers.  He does not remember almost passing out.  Here for evaluation.  Currently feels back to normal.  Does not have any headache or vision change, no neck pain, no back pain, no chest pain, no shortness of breath, currently without any abdominal pain or discomfort, no nausea vomiting or diarrhea today, no numbness or weakness to the arms or legs.  No recent fever or cough or other illnesses.  Review of Systems  A thorough review of systems was obtained and all systems are negative except as noted in the HPI and PMH.   Patient's Health History    Past Medical History:  Diagnosis Date   Acute upper GI bleed    Coronary artery disease    a. s/p NSTEMI in 2010 with DES to proximal LAD with D1 jailed and angioplasty alone to ostium   Hyperlipidemia    Hypertension    Hypertension    Peripheral arterial disease ([place])    Tobacco abuse    Type 2 diabetes mellitus ([place])     Past Surgical History:  Procedure Laterality Date   CARDIAC SURGERY     COLONOSCOPY WITH PROPOFOL N/A 01/08/2020   Procedure: COLONOSCOPY WITH PROPOFOL;  Surgeon: Eloise [name], DO;  [facility];  Service: Endoscopy;  Laterality: N/A;   COLONOSCOPY WITH PROPOFOL N/A 05/03/2020   Hung:entire examined colon normal   CORONARY STENT INTERVENTION  N/A 09/03/2019   Procedure: CORONARY STENT INTERVENTION;  Surgeon: Burnell Blanks, MD;  Location: [place] [facility];  Service: Cardiovascular;  Laterality: N/A;   CORONARY STENT PLACEMENT     ENTEROSCOPY N/A 05/03/2020   hung:normal esophagus, normal stomach. Three non-bleeding angiodysplastic lesions in the duodenum. Treated with a monopolar probe. A few non-bleeding angiodysplastic lesions in the jejunum. Treated with a monopolar probe. No specimens collected.   ESOPHAGOGASTRODUODENOSCOPY (EGD) WITH PROPOFOL N/A 01/06/2020   Procedure: ESOPHAGOGASTRODUODENOSCOPY (EGD) WITH PROPOFOL;  Surgeon: Rogene Houston, MD;  [facility];  Service: Endoscopy;  Laterality: N/A;   HOT HEMOSTASIS N/A 05/03/2020   Procedure: HOT HEMOSTASIS (ARGON PLASMA COAGULATION/BICAP);  Surgeon: Carol Ada, MD;  Location: [facility];  Service: Gastroenterology;  Laterality: N/A;   ICD IMPLANT N/A 09/04/2019   Procedure: ICD IMPLANT;  Surgeon: Evans Lance, MD;  Location: [place] [facility];  Service: Cardiovascular;  Laterality: N/A;   RIGHT/LEFT HEART CATH AND CORONARY ANGIOGRAPHY N/A 09/03/2019   Procedure: RIGHT/LEFT HEART CATH AND CORONARY ANGIOGRAPHY;  Surgeon: Jolaine Artist, MD;  Location: [place] [facility];  Service: Cardiovascular;  Laterality: N/A;    Family History  Problem Relation Age of Onset   Cancer Mother        not sure location    Colon cancer Neg Hx    Stomach cancer Neg Hx  Social History   Socioeconomic History   Marital status: Divorced    Spouse name: Not on file   Number of children: Not on file   Years of education: Not on file   Highest education level: Not on file  Occupational History   Not on file  Tobacco Use   Smoking status: Former    Packs/day: 0.50    Years: 52.00    Total pack years: 26.00    Types: Cigarettes   Smokeless tobacco: Never  Vaping Use   Vaping Use: Never used  Substance and Sexual Activity   Alcohol use: No    Drug use: No   Sexual activity: Never  Other Topics Concern   Not on file  Social History Narrative   Not on file   Social Determinants of Health   Financial Resource Strain: Not on file  Food Insecurity: Not on file  Transportation Needs: Not on file  Physical Activity: Not on file  Stress: Not on file  Social Connections: Not on file  Intimate Partner Violence: Not on file     Physical Exam   Vitals:   11/26/21 2300 11/26/21 2330  BP: (!) 121/53 (!) 151/59  Pulse: 68   Resp: 15 18  Temp:    SpO2: 99%     CONSTITUTIONAL: Well-appearing, NAD NEURO/PSYCH:  Alert and oriented x 3, no focal deficits EYES:  eyes equal and reactive ENT/NECK:  no LAD, no JVD CARDIO: Regular rate, well-perfused, normal S1 and S2 PULM:  CTAB no wheezing or rhonchi GI/GU:  non-distended, non-tender MSK/SPINE:  No gross deformities, no edema SKIN:  no rash, atraumatic   *Additional and/or pertinent findings included in MDM below  Diagnostic and Interventional Summary    EKG Interpretation  Date/Time:  Sunday November 26 2021 22:38:09 EDT Ventricular Rate:  72 PR Interval:  177 QRS Duration: 152 QT Interval:  438 QTC Calculation: 480 R Axis:   75 Text Interpretation: Sinus rhythm Right bundle branch block Confirmed by Gerlene Fee [phone]) on 11/27/2021 2:21:49 AM       Labs Reviewed  CBC - Abnormal; Notable for the following components:      Result Value   RBC 3.35 (*)    Hemoglobin 10.0 (*)    HCT 31.5 (*)    All other components within normal limits  COMPREHENSIVE METABOLIC PANEL - Abnormal; Notable for the following components:   Glucose, Bld 137 (*)    BUN 34 (*)    Creatinine, Ser 2.24 (*)    Total Protein 8.2 (*)    GFR, Estimated 29 (*)    All other components within normal limits  MAGNESIUM  TYPE AND SCREEN  TROPONIN I (HIGH SENSITIVITY)  TROPONIN I (HIGH SENSITIVITY)    DG Abdomen Acute W/Chest  Final Result      Medications - No data to display    Procedures  /  Critical Care Procedures  ED Course and Medical Decision Making  Initial Impression and Ddx Differential diagnosis includes ACS, anemia, electrolyte disturbance, arrhythmia.  Could also be explained by some indigestion followed by near syncope related to pharmacologic causes, namely the nitroglycerin tablet.  Awaiting labs, plain films.  Currently without any symptoms, will monitor closely.  Past medical/surgical history that increases complexity of ED encounter: Cardiac arrest, ICD, CAD  Interpretation of Diagnostics I personally reviewed the EKG and my interpretation is as follows: Sinus rhythm, bundle branch block  Labs reassuring with no significant blood count or electrolyte disturbance.  Troponin negative  x2.  Chest x-ray normal, abdominal plain film showing evidence of possible ileus.  Patient Reassessment and Ultimate Disposition/Management     On multiple reassessments patient denies any symptoms.  Continues to have a somewhat distended abdomen but is completely nontender.  He explains that he typically has some abdominal distention, has a long history of constipation, has to use laxatives.  He has been passing gas today normally, had a bowel movement a few days ago which is not abnormal for him.  Overall doubt emergent process such as SBO.  Strict return precautions, appropriate for discharge.  Patient management required discussion with the following services or consulting groups:  None  Complexity of Problems Addressed Acute illness or injury that poses threat of life of bodily function  Additional Data Reviewed and Analyzed Further history obtained from: Recent Consult notes and Prior labs/imaging results  Additional Factors Impacting ED Encounter Risk None  Barth Kirks. [place], [place] [email or web address]  Final Clinical Impressions(s) / ED Diagnoses     ICD-10-CM   1. Malaise  R53.81       ED  Discharge Orders     None        Discharge Instructions Discussed with and Provided to Patient:     Discharge Instructions      You were evaluated in the Emergency Department and after careful evaluation, we did not find any emergent condition requiring admission or further testing in the hospital.  Your exam/testing today was overall reassuring.  Please return to the Emergency Department if you experience any worsening of your condition.  Thank you for allowing [place] to be a part of your care.        Maudie Flakes, MD 11/27/21 [phone]

## 2021-11-26 NOTE — ED Triage Notes (Signed)
Pt BIB [facility] from work. Pt states he started not feeling right and took 1 nitro. Per coworkers pt had a near syncopal episode right before they called 911. Pt denies any pain, just states he still doesn't feel right.

## 2021-11-27 LAB — TROPONIN I (HIGH SENSITIVITY): Troponin I (High Sensitivity): 13 ng/L (ref <18)

## 2021-11-27 NOTE — Discharge Instructions (Signed)
You were evaluated in the Emergency Department and after careful evaluation, we did not find any emergent condition requiring admission or further testing in the hospital.  Your exam/testing today was overall reassuring.  Please return to the Emergency Department if you experience any worsening of your condition.  Thank you for allowing us to be a part of your care.  

## 2021-11-29 DIAGNOSIS — E663 Overweight: Secondary | ICD-10-CM | POA: Diagnosis not present

## 2021-11-29 DIAGNOSIS — R55 Syncope and collapse: Secondary | ICD-10-CM | POA: Diagnosis not present

## 2021-11-29 DIAGNOSIS — J449 Chronic obstructive pulmonary disease, unspecified: Secondary | ICD-10-CM | POA: Diagnosis not present

## 2021-11-29 DIAGNOSIS — Z6826 Body mass index (BMI) 26.0-26.9, adult: Secondary | ICD-10-CM | POA: Diagnosis not present

## 2021-11-29 DIAGNOSIS — I509 Heart failure, unspecified: Secondary | ICD-10-CM | POA: Diagnosis not present

## 2021-11-29 DIAGNOSIS — I4891 Unspecified atrial fibrillation: Secondary | ICD-10-CM | POA: Diagnosis not present

## 2021-11-29 DIAGNOSIS — N183 Chronic kidney disease, stage 3 unspecified: Secondary | ICD-10-CM | POA: Diagnosis not present

## 2021-12-04 ENCOUNTER — Ambulatory Visit (INDEPENDENT_AMBULATORY_CARE_PROVIDER_SITE_OTHER): Payer: PPO

## 2021-12-04 DIAGNOSIS — I4901 Ventricular fibrillation: Secondary | ICD-10-CM

## 2021-12-05 DIAGNOSIS — E538 Deficiency of other specified B group vitamins: Secondary | ICD-10-CM | POA: Diagnosis not present

## 2021-12-05 LAB — CUP PACEART REMOTE DEVICE CHECK
Battery Remaining Longevity: 144 mo
Battery Remaining Percentage: 100 %
Brady Statistic RA Percent Paced: 29 %
Brady Statistic RV Percent Paced: 0 %
Date Time Interrogation Session: 20231009075900
HighPow Impedance: 69 Ohm
Implantable Lead Implant Date: 20210709
Implantable Lead Implant Date: 20210709
Implantable Lead Location: 753859
Implantable Lead Location: 753860
Implantable Lead Model: 137
Implantable Lead Model: 7841
Implantable Lead Serial Number: 1063971
Implantable Lead Serial Number: 300935
Implantable Pulse Generator Implant Date: 20210709
Lead Channel Impedance Value: 430 Ohm
Lead Channel Impedance Value: 649 Ohm
Lead Channel Setting Pacing Amplitude: 2 V
Lead Channel Setting Pacing Amplitude: 2.2 V
Lead Channel Setting Pacing Pulse Width: 0.4 ms
Lead Channel Setting Sensing Sensitivity: 0.5 mV
Pulse Gen Serial Number: 224993

## 2021-12-08 DIAGNOSIS — Z87891 Personal history of nicotine dependence: Secondary | ICD-10-CM | POA: Diagnosis not present

## 2021-12-08 DIAGNOSIS — Z515 Encounter for palliative care: Secondary | ICD-10-CM | POA: Diagnosis not present

## 2021-12-08 DIAGNOSIS — I5032 Chronic diastolic (congestive) heart failure: Secondary | ICD-10-CM | POA: Diagnosis not present

## 2021-12-08 DIAGNOSIS — J449 Chronic obstructive pulmonary disease, unspecified: Secondary | ICD-10-CM | POA: Diagnosis not present

## 2021-12-15 NOTE — Progress Notes (Signed)
Remote ICD transmission.   

## 2021-12-26 DIAGNOSIS — N183 Chronic kidney disease, stage 3 unspecified: Secondary | ICD-10-CM | POA: Diagnosis not present

## 2021-12-26 DIAGNOSIS — E1122 Type 2 diabetes mellitus with diabetic chronic kidney disease: Secondary | ICD-10-CM | POA: Diagnosis not present

## 2021-12-26 DIAGNOSIS — J449 Chronic obstructive pulmonary disease, unspecified: Secondary | ICD-10-CM | POA: Diagnosis not present

## 2021-12-26 DIAGNOSIS — E782 Mixed hyperlipidemia: Secondary | ICD-10-CM | POA: Diagnosis not present

## 2021-12-29 DIAGNOSIS — E538 Deficiency of other specified B group vitamins: Secondary | ICD-10-CM | POA: Diagnosis not present

## 2022-01-05 DIAGNOSIS — Z6826 Body mass index (BMI) 26.0-26.9, adult: Secondary | ICD-10-CM | POA: Diagnosis not present

## 2022-01-05 DIAGNOSIS — E663 Overweight: Secondary | ICD-10-CM | POA: Diagnosis not present

## 2022-01-05 DIAGNOSIS — J069 Acute upper respiratory infection, unspecified: Secondary | ICD-10-CM | POA: Diagnosis not present

## 2022-01-31 DIAGNOSIS — E538 Deficiency of other specified B group vitamins: Secondary | ICD-10-CM | POA: Diagnosis not present

## 2022-02-28 DIAGNOSIS — E538 Deficiency of other specified B group vitamins: Secondary | ICD-10-CM | POA: Diagnosis not present

## 2022-03-05 ENCOUNTER — Ambulatory Visit (INDEPENDENT_AMBULATORY_CARE_PROVIDER_SITE_OTHER): Payer: PPO

## 2022-03-05 DIAGNOSIS — I4901 Ventricular fibrillation: Secondary | ICD-10-CM

## 2022-03-06 LAB — CUP PACEART REMOTE DEVICE CHECK
Battery Remaining Longevity: 138 mo
Battery Remaining Percentage: 100 %
Brady Statistic RA Percent Paced: 28 %
Brady Statistic RV Percent Paced: 0 %
Date Time Interrogation Session: 20240108074600
HighPow Impedance: 74 Ohm
Implantable Lead Connection Status: 753985
Implantable Lead Connection Status: 753985
Implantable Lead Implant Date: 20210709
Implantable Lead Implant Date: 20210709
Implantable Lead Location: 753859
Implantable Lead Location: 753860
Implantable Lead Model: 137
Implantable Lead Model: 7841
Implantable Lead Serial Number: 1063971
Implantable Lead Serial Number: 300935
Implantable Pulse Generator Implant Date: 20210709
Lead Channel Impedance Value: 446 Ohm
Lead Channel Impedance Value: 729 Ohm
Lead Channel Setting Pacing Amplitude: 2 V
Lead Channel Setting Pacing Amplitude: 2.2 V
Lead Channel Setting Pacing Pulse Width: 0.4 ms
Lead Channel Setting Sensing Sensitivity: 0.5 mV
Pulse Gen Serial Number: 224993
Zone Setting Status: 755011

## 2022-03-30 DIAGNOSIS — E538 Deficiency of other specified B group vitamins: Secondary | ICD-10-CM | POA: Diagnosis not present

## 2022-04-06 NOTE — Progress Notes (Signed)
Remote ICD transmission.   

## 2022-05-02 DIAGNOSIS — E538 Deficiency of other specified B group vitamins: Secondary | ICD-10-CM | POA: Diagnosis not present

## 2022-05-31 DIAGNOSIS — E538 Deficiency of other specified B group vitamins: Secondary | ICD-10-CM | POA: Diagnosis not present

## 2022-06-04 ENCOUNTER — Ambulatory Visit (INDEPENDENT_AMBULATORY_CARE_PROVIDER_SITE_OTHER): Payer: PPO

## 2022-06-04 DIAGNOSIS — I4901 Ventricular fibrillation: Secondary | ICD-10-CM

## 2022-06-04 LAB — CUP PACEART REMOTE DEVICE CHECK
Battery Remaining Longevity: 138 mo
Battery Remaining Percentage: 100 %
Brady Statistic RA Percent Paced: 28 %
Brady Statistic RV Percent Paced: 0 %
Date Time Interrogation Session: 20240408075900
HighPow Impedance: 73 Ohm
Implantable Lead Connection Status: 753985
Implantable Lead Connection Status: 753985
Implantable Lead Implant Date: 20210709
Implantable Lead Implant Date: 20210709
Implantable Lead Location: 753859
Implantable Lead Location: 753860
Implantable Lead Model: 137
Implantable Lead Model: 7841
Implantable Lead Serial Number: 1063971
Implantable Lead Serial Number: 300935
Implantable Pulse Generator Implant Date: 20210709
Lead Channel Impedance Value: 442 Ohm
Lead Channel Impedance Value: 686 Ohm
Lead Channel Setting Pacing Amplitude: 2 V
Lead Channel Setting Pacing Amplitude: 2.2 V
Lead Channel Setting Pacing Pulse Width: 0.4 ms
Lead Channel Setting Sensing Sensitivity: 0.5 mV
Pulse Gen Serial Number: 224993
Zone Setting Status: 755011

## 2022-06-14 DIAGNOSIS — Z6825 Body mass index (BMI) 25.0-25.9, adult: Secondary | ICD-10-CM | POA: Diagnosis not present

## 2022-06-14 DIAGNOSIS — I4891 Unspecified atrial fibrillation: Secondary | ICD-10-CM | POA: Diagnosis not present

## 2022-06-14 DIAGNOSIS — E538 Deficiency of other specified B group vitamins: Secondary | ICD-10-CM | POA: Diagnosis not present

## 2022-06-14 DIAGNOSIS — J449 Chronic obstructive pulmonary disease, unspecified: Secondary | ICD-10-CM | POA: Diagnosis not present

## 2022-06-14 DIAGNOSIS — R7303 Prediabetes: Secondary | ICD-10-CM | POA: Diagnosis not present

## 2022-06-14 DIAGNOSIS — Z1331 Encounter for screening for depression: Secondary | ICD-10-CM | POA: Diagnosis not present

## 2022-06-14 DIAGNOSIS — E663 Overweight: Secondary | ICD-10-CM | POA: Diagnosis not present

## 2022-06-14 DIAGNOSIS — I10 Essential (primary) hypertension: Secondary | ICD-10-CM | POA: Diagnosis not present

## 2022-06-14 DIAGNOSIS — I509 Heart failure, unspecified: Secondary | ICD-10-CM | POA: Diagnosis not present

## 2022-06-14 DIAGNOSIS — I2510 Atherosclerotic heart disease of native coronary artery without angina pectoris: Secondary | ICD-10-CM | POA: Diagnosis not present

## 2022-06-14 DIAGNOSIS — Z0001 Encounter for general adult medical examination with abnormal findings: Secondary | ICD-10-CM | POA: Diagnosis not present

## 2022-06-27 DIAGNOSIS — I700 Atherosclerosis of aorta: Secondary | ICD-10-CM | POA: Diagnosis not present

## 2022-06-27 DIAGNOSIS — N183 Chronic kidney disease, stage 3 unspecified: Secondary | ICD-10-CM | POA: Diagnosis not present

## 2022-06-27 DIAGNOSIS — E663 Overweight: Secondary | ICD-10-CM | POA: Diagnosis not present

## 2022-06-27 DIAGNOSIS — E1140 Type 2 diabetes mellitus with diabetic neuropathy, unspecified: Secondary | ICD-10-CM | POA: Diagnosis not present

## 2022-06-27 DIAGNOSIS — I509 Heart failure, unspecified: Secondary | ICD-10-CM | POA: Diagnosis not present

## 2022-06-27 DIAGNOSIS — E1129 Type 2 diabetes mellitus with other diabetic kidney complication: Secondary | ICD-10-CM | POA: Diagnosis not present

## 2022-06-27 DIAGNOSIS — Z6825 Body mass index (BMI) 25.0-25.9, adult: Secondary | ICD-10-CM | POA: Diagnosis not present

## 2022-06-27 DIAGNOSIS — I10 Essential (primary) hypertension: Secondary | ICD-10-CM | POA: Diagnosis not present

## 2022-06-27 DIAGNOSIS — I4891 Unspecified atrial fibrillation: Secondary | ICD-10-CM | POA: Diagnosis not present

## 2022-06-27 DIAGNOSIS — M109 Gout, unspecified: Secondary | ICD-10-CM | POA: Diagnosis not present

## 2022-06-27 DIAGNOSIS — K558 Other vascular disorders of intestine: Secondary | ICD-10-CM | POA: Diagnosis not present

## 2022-06-27 DIAGNOSIS — J449 Chronic obstructive pulmonary disease, unspecified: Secondary | ICD-10-CM | POA: Diagnosis not present

## 2022-06-29 DIAGNOSIS — E538 Deficiency of other specified B group vitamins: Secondary | ICD-10-CM | POA: Diagnosis not present

## 2022-07-10 NOTE — Progress Notes (Signed)
Remote ICD transmission.   

## 2022-07-18 ENCOUNTER — Other Ambulatory Visit: Payer: Self-pay | Admitting: Internal Medicine

## 2022-07-18 ENCOUNTER — Encounter: Payer: Self-pay | Admitting: Internal Medicine

## 2022-07-18 ENCOUNTER — Ambulatory Visit: Payer: PPO | Attending: Internal Medicine | Admitting: Internal Medicine

## 2022-07-18 VITALS — BP 118/60 | HR 74 | Ht 68.0 in | Wt 167.2 lb

## 2022-07-18 DIAGNOSIS — I472 Ventricular tachycardia, unspecified: Secondary | ICD-10-CM

## 2022-07-18 LAB — CUP PACEART INCLINIC DEVICE CHECK
Date Time Interrogation Session: 20240522154708
Implantable Lead Connection Status: 753985
Implantable Lead Connection Status: 753985
Implantable Lead Implant Date: 20210709
Implantable Lead Implant Date: 20210709
Implantable Lead Location: 753859
Implantable Lead Location: 753860
Implantable Lead Model: 137
Implantable Lead Model: 7841
Implantable Lead Serial Number: 1063971
Implantable Lead Serial Number: 300935
Implantable Pulse Generator Implant Date: 20210709
Pulse Gen Serial Number: 224993

## 2022-07-18 MED ORDER — DABIGATRAN ETEXILATE MESYLATE 150 MG PO CAPS: 150.0000 mg | ORAL_CAPSULE | Freq: Two times a day (BID) | ORAL | 11 refills | Status: DC

## 2022-07-18 NOTE — Telephone Encounter (Signed)
This pradaxa refill was sent in today while pt was at the appointment. Called the pharmacy since receiving this and was told that pradaxa is not on his formulary and the pt would have to pay $649. Will forward to ordering nurse and Physician regarding options and follow up with the patient.

## 2022-07-18 NOTE — Progress Notes (Signed)
HPI [name] returns after being treated for a VF arrest over a year ago. He is s/p ICD insertion. He denies chest pain or sob. Since ICD insertion, he denies chest pain or sob. He has some leg pain with exertion and has known ax/bifem disease. He has not had any problems with his ICD therapies. He has been unable to pay for his eliquis after the monthly price increased to $400. He has some insomnia.  No Known Allergies   Current Outpatient Medications  Medication Sig Dispense Refill   acetaminophen (TYLENOL) 325 MG tablet Take 2 tablets (650 mg total) by mouth every 6 (six) hours as needed for mild pain, fever or headache (or Fever >/= 101). 30 tablet 0   albuterol (VENTOLIN HFA) 108 (90 Base) MCG/ACT inhaler Inhale 2 puffs into the lungs every 4 (four) hours as needed for wheezing or shortness of breath. 18 g 3   amiodarone (PACERONE) 100 MG tablet Take 1 tablet (100 mg total) by mouth daily. 30 tablet 0   apixaban (ELIQUIS) 5 MG TABS tablet Take 1 tablet (5 mg total) by mouth 2 (two) times daily. 180 tablet 1   atorvastatin (LIPITOR) 80 MG tablet Take 80 mg by mouth daily.     carvedilol (COREG) 3.125 MG tablet Take 1 tablet (3.125 mg total) by mouth daily with breakfast. 30 tablet 0   [facility] GENTLE LAXATIVE 5 MG EC tablet TAKE 1 TABLET BY MOUTH DAILY AS NEEDED FOR MODERATE CONSTIPATION. TAKE TWO DAYS BEFORE PROCEDURE 30 tablet 0   cyanocobalamin (,VITAMIN B-12,) 1000 MCG/ML injection Inject 1,000 mcg into the muscle every 30 (thirty) days.     cyanocobalamin 1000 MCG tablet Take 1,000 mcg by mouth daily.     ferrous sulfate (FERROUSUL) 325 (65 FE) MG tablet Take 1 tablet (325 mg total) by mouth daily with breakfast.  0   furosemide (LASIX) 20 MG tablet Take 20 mg by mouth daily.     guaiFENesin (MUCINEX) 600 MG 12 hr tablet Take 600 mg by mouth 2 (two) times daily as needed for cough or to loosen phlegm.     losartan (COZAAR) 25 MG tablet Take 25 mg by mouth daily.     nitroGLYCERIN  (NITROSTAT) 0.4 MG SL tablet Place 1 tablet (0.4 mg total) under the tongue every 5 (five) minutes as needed for chest pain. Patient must keep upcoming appointment for further refills 25 tablet 12   omeprazole (PRILOSEC) 20 MG capsule Take 20 mg by mouth daily.     potassium chloride SA (KLOR-CON) 20 MEQ tablet Take 20 mEq by mouth daily.     spironolactone (ALDACTONE) 50 MG tablet Take 50 mg by mouth daily.     No current facility-administered medications for this visit.     Past Medical History:  Diagnosis Date   Acute upper GI bleed    Coronary artery disease    a. s/p NSTEMI in 2010 with DES to proximal LAD with D1 jailed and angioplasty alone to ostium   Hyperlipidemia    Hypertension    Hypertension    Peripheral arterial disease (HCC)    Tobacco abuse    Type 2 diabetes mellitus (HCC)     ROS:   All systems reviewed and negative except as noted in the HPI.   Past Surgical History:  Procedure Laterality Date   CARDIAC SURGERY     COLONOSCOPY WITH PROPOFOL N/A 01/08/2020   Procedure: COLONOSCOPY WITH PROPOFOL;  Surgeon: Lanelle Bal,  DO;  Location: [facility];  Service: Endoscopy;  Laterality: N/A;   COLONOSCOPY WITH PROPOFOL N/A 05/03/2020   Hung:entire examined colon normal   CORONARY STENT INTERVENTION N/A 09/03/2019   Procedure: CORONARY STENT INTERVENTION;  Surgeon: Kathleene Hazel, MD;  Location: [facility] INVASIVE CV LAB;  Service: Cardiovascular;  Laterality: N/A;   CORONARY STENT PLACEMENT     ENTEROSCOPY N/A 05/03/2020   hung:normal esophagus, normal stomach. Three non-bleeding angiodysplastic lesions in the duodenum. Treated with a monopolar probe. A few non-bleeding angiodysplastic lesions in the jejunum. Treated with a monopolar probe. No specimens collected.   ESOPHAGOGASTRODUODENOSCOPY (EGD) WITH PROPOFOL N/A 01/06/2020   Procedure: ESOPHAGOGASTRODUODENOSCOPY (EGD) WITH PROPOFOL;  Surgeon: Malissa Hippo, MD;  Location: [facility];  Service:  Endoscopy;  Laterality: N/A;   HOT HEMOSTASIS N/A 05/03/2020   Procedure: HOT HEMOSTASIS (ARGON PLASMA COAGULATION/BICAP);  Surgeon: Jeani Hawking, MD;  Location: [facility] ENDOSCOPY;  Service: Gastroenterology;  Laterality: N/A;   ICD IMPLANT N/A 09/04/2019   Procedure: ICD IMPLANT;  Surgeon: Marinus Maw, MD;  Location: [facility] INVASIVE CV LAB;  Service: Cardiovascular;  Laterality: N/A;   RIGHT/LEFT HEART CATH AND CORONARY ANGIOGRAPHY N/A 09/03/2019   Procedure: RIGHT/LEFT HEART CATH AND CORONARY ANGIOGRAPHY;  Surgeon: Dolores Patty, MD;  Location: [facility] INVASIVE CV LAB;  Service: Cardiovascular;  Laterality: N/A;     Family History  Problem Relation Age of Onset   Cancer Mother        not sure location    Colon cancer Neg Hx    Stomach cancer Neg Hx      Social History   Socioeconomic History   Marital status: Divorced    Spouse name: Not on file   Number of children: Not on file   Years of education: Not on file   Highest education level: Not on file  Occupational History   Not on file  Tobacco Use   Smoking status: Former    Packs/day: 0.50    Years: 52.00    Additional pack years: 0.00    Total pack years: 26.00    Types: Cigarettes   Smokeless tobacco: Never  Vaping Use   Vaping Use: Never used  Substance and Sexual Activity   Alcohol use: No   Drug use: No   Sexual activity: Never  Other Topics Concern   Not on file  Social History Narrative   Not on file   Social Determinants of Health   Financial Resource Strain: Not on file  Food Insecurity: Not on file  Transportation Needs: Not on file  Physical Activity: Not on file  Stress: Not on file  Social Connections: Not on file  Intimate Partner Violence: Not on file     BP 118/60 (BP Location: Left Arm, Patient Position: Sitting, Cuff Size: Normal)   Pulse 74   Ht 5\' 8"  (1.727 m)   Wt 167 lb 4 oz (75.9 kg)   SpO2 97%   BMI 25.43 kg/m   Physical Exam:  Well appearing NAD HEENT:  Unremarkable Neck:  No JVD, no thyromegally Lymphatics:  No adenopathy Back:  No CVA tenderness Lungs:  Clear HEART:  Regular rate rhythm, no murmurs, no rubs, no clicks Abd:  soft, positive bowel sounds, no organomegally, no rebound, no guarding Ext:  2 plus pulses, no edema, no cyanosis, no clubbing Skin:  No rashes no nodules Neuro:  CN II through XII intact, motor grossly intact   DEVICE  Normal device function.  See PaceArt for  details.   Assess/Plan:  VF arrest - he has had no recurrent episodes. No change in meds. He will continue low dose amiodarone. 2. NSTEMI - He denies anginal symptoms. No change in meds. 3. PAD - he has minimal claudication. He will continue his current meds. 4. HTN - his bp is down. He will continue his current meds. 5. Coags - I asked him to try Pradaxa.   Loman Chroman Debora Stockdale,MD

## 2022-07-18 NOTE — Patient Instructions (Signed)
Medication Instructions:   Stop Taking Eliquis   Start Taking Pradaxa 150 mg Two Times Daily ( With GoodRx)   *If you need a refill on your cardiac medications before your next appointment, please call your pharmacy*   Lab Work: NONE   If you have labs (blood work) drawn today and your tests are completely normal, you will receive your results only by: MyChart Message (if you have MyChart) OR A paper copy in the mail If you have any lab test that is abnormal or we need to change your treatment, we will call you to review the results.   Testing/Procedures: NONE    Follow-Up: At [facility], you and your health needs are our priority.  As part of our continuing mission to provide you with exceptional heart care, we have created designated Provider Care Teams.  These Care Teams include your primary Cardiologist (physician) and Advanced Practice Providers (APPs -  Physician Assistants and Nurse Practitioners) who all work together to provide you with the care you need, when you need it.  We recommend signing up for the patient portal called "MyChart".  Sign up information is provided on this After Visit Summary.  MyChart is used to connect with patients for Virtual Visits (Telemedicine).  Patients are able to view lab/test results, encounter notes, upcoming appointments, etc.  Non-urgent messages can be sent to your provider as well.   To learn more about what you can do with MyChart, go to [email or web address].    Your next appointment:   1 year(s)  Provider:   Lewayne Bunting, MD    Other Instructions Thank you for choosing [facility]!

## 2022-07-26 DIAGNOSIS — E039 Hypothyroidism, unspecified: Secondary | ICD-10-CM | POA: Diagnosis not present

## 2022-07-26 DIAGNOSIS — E663 Overweight: Secondary | ICD-10-CM | POA: Diagnosis not present

## 2022-07-26 DIAGNOSIS — Z6825 Body mass index (BMI) 25.0-25.9, adult: Secondary | ICD-10-CM | POA: Diagnosis not present

## 2022-08-01 DIAGNOSIS — I10 Essential (primary) hypertension: Secondary | ICD-10-CM | POA: Diagnosis not present

## 2022-08-01 DIAGNOSIS — N1832 Chronic kidney disease, stage 3b: Secondary | ICD-10-CM | POA: Diagnosis not present

## 2022-08-02 ENCOUNTER — Other Ambulatory Visit (HOSPITAL_COMMUNITY): Payer: Self-pay | Admitting: Nephrology

## 2022-08-02 DIAGNOSIS — N1832 Chronic kidney disease, stage 3b: Secondary | ICD-10-CM

## 2022-08-03 DIAGNOSIS — E538 Deficiency of other specified B group vitamins: Secondary | ICD-10-CM | POA: Diagnosis not present

## 2022-08-06 ENCOUNTER — Other Ambulatory Visit (HOSPITAL_COMMUNITY): Payer: Self-pay

## 2022-08-06 ENCOUNTER — Telehealth: Payer: Self-pay

## 2022-08-06 DIAGNOSIS — I482 Chronic atrial fibrillation, unspecified: Secondary | ICD-10-CM

## 2022-08-06 NOTE — Telephone Encounter (Signed)
Pharmacy Patient Advocate Encounter   Received notification from [employer] that prior authorization for PRADAXA 150 MG CAP is needed.    PA submitted on 08/06/22 Key [id number] Status is pending  Haze Rushing, CPhT Pharmacy Patient Advocate Specialist Direct Number: [phone] Fax: [phone]

## 2022-08-06 NOTE — Telephone Encounter (Signed)
PA initiated, please see separate encounter for updates on determination. (I will route you back in once a decision has been made)  Keelyn Monjaras, CPhT Pharmacy Patient Advocate Specialist Direct Number: [phone] Fax: [phone]  

## 2022-08-07 NOTE — Telephone Encounter (Signed)
Pharmacy Patient Advocate Encounter  Prior Authorization for [facility] has been approved.    Effective dates: 08/06/22 through 02/26/23  Haze Rushing, CPhT Pharmacy Patient Advocate Specialist Direct Number: [phone] Fax: [phone]

## 2022-08-08 MED ORDER — DABIGATRAN ETEXILATE MESYLATE 150 MG PO CAPS: 150.0000 mg | ORAL_CAPSULE | Freq: Two times a day (BID) | ORAL | 1 refills | Status: DC

## 2022-08-08 NOTE — Addendum Note (Signed)
Addended by: Cheree Ditto on: 08/08/2022 12:16 PM   Modules accepted: Orders

## 2022-08-09 ENCOUNTER — Telehealth: Payer: Self-pay | Admitting: Cardiovascular Disease

## 2022-08-09 ENCOUNTER — Other Ambulatory Visit (HOSPITAL_COMMUNITY): Payer: Self-pay | Admitting: Cardiovascular Disease

## 2022-08-09 DIAGNOSIS — I739 Peripheral vascular disease, unspecified: Secondary | ICD-10-CM

## 2022-08-09 DIAGNOSIS — I6522 Occlusion and stenosis of left carotid artery: Secondary | ICD-10-CM

## 2022-08-09 NOTE — Telephone Encounter (Signed)
Patient called to cancel tests that were scheduled for 06/19 and would like a call back to reschedule once the orders are extended past 06/19.

## 2022-08-15 ENCOUNTER — Encounter (HOSPITAL_COMMUNITY): Payer: PPO

## 2022-08-15 ENCOUNTER — Ambulatory Visit (HOSPITAL_COMMUNITY): Payer: PPO

## 2022-08-16 ENCOUNTER — Ambulatory Visit (HOSPITAL_COMMUNITY)
Admission: RE | Admit: 2022-08-16 | Discharge: 2022-08-16 | Disposition: A | Discharge status: Home / Self Care | Payer: PPO | Source: Ambulatory Visit | Attending: Nephrology | Admitting: Nephrology

## 2022-08-16 DIAGNOSIS — N200 Calculus of kidney: Secondary | ICD-10-CM | POA: Diagnosis not present

## 2022-08-16 DIAGNOSIS — N189 Chronic kidney disease, unspecified: Secondary | ICD-10-CM | POA: Diagnosis not present

## 2022-08-16 DIAGNOSIS — N1832 Chronic kidney disease, stage 3b: Secondary | ICD-10-CM | POA: Diagnosis not present

## 2022-08-28 DIAGNOSIS — E538 Deficiency of other specified B group vitamins: Secondary | ICD-10-CM | POA: Diagnosis not present

## 2022-08-29 ENCOUNTER — Ambulatory Visit (HOSPITAL_BASED_OUTPATIENT_CLINIC_OR_DEPARTMENT_OTHER)
Admission: RE | Admit: 2022-08-29 | Discharge: 2022-08-29 | Disposition: A | Discharge status: Home / Self Care | Payer: PPO | Source: Ambulatory Visit | Attending: Cardiovascular Disease | Admitting: Cardiovascular Disease

## 2022-08-29 ENCOUNTER — Ambulatory Visit (HOSPITAL_COMMUNITY)
Admission: RE | Admit: 2022-08-29 | Discharge: 2022-08-29 | Disposition: A | Discharge status: Home / Self Care | Payer: PPO | Source: Ambulatory Visit | Attending: Cardiovascular Disease | Admitting: Cardiovascular Disease

## 2022-08-29 DIAGNOSIS — I6522 Occlusion and stenosis of left carotid artery: Secondary | ICD-10-CM | POA: Insufficient documentation

## 2022-08-29 DIAGNOSIS — Z95820 Peripheral vascular angioplasty status with implants and grafts: Secondary | ICD-10-CM

## 2022-08-29 DIAGNOSIS — I739 Peripheral vascular disease, unspecified: Secondary | ICD-10-CM | POA: Diagnosis not present

## 2022-08-30 LAB — VAS US ABI WITH/WO TBI
Left ABI: 0.58
Right ABI: 0.64

## 2022-09-03 ENCOUNTER — Ambulatory Visit (INDEPENDENT_AMBULATORY_CARE_PROVIDER_SITE_OTHER): Payer: PPO

## 2022-09-03 DIAGNOSIS — I482 Chronic atrial fibrillation, unspecified: Secondary | ICD-10-CM | POA: Diagnosis not present

## 2022-09-03 LAB — CUP PACEART REMOTE DEVICE CHECK
Battery Remaining Longevity: 138 mo
Battery Remaining Percentage: 100 %
Brady Statistic RA Percent Paced: 13 %
Brady Statistic RV Percent Paced: 0 %
Date Time Interrogation Session: 20240708074600
HighPow Impedance: 72 Ohm
Implantable Lead Connection Status: 753985
Implantable Lead Connection Status: 753985
Implantable Lead Implant Date: 20210709
Implantable Lead Implant Date: 20210709
Implantable Lead Location: 753859
Implantable Lead Location: 753860
Implantable Lead Model: 137
Implantable Lead Model: 7841
Implantable Lead Serial Number: 1063971
Implantable Lead Serial Number: 300935
Implantable Pulse Generator Implant Date: 20210709
Lead Channel Impedance Value: 443 Ohm
Lead Channel Impedance Value: 648 Ohm
Lead Channel Setting Pacing Amplitude: 2 V
Lead Channel Setting Pacing Amplitude: 2.2 V
Lead Channel Setting Pacing Pulse Width: 0.4 ms
Lead Channel Setting Sensing Sensitivity: 0.5 mV
Pulse Gen Serial Number: 224993
Zone Setting Status: 755011

## 2022-09-04 DIAGNOSIS — D631 Anemia in chronic kidney disease: Secondary | ICD-10-CM | POA: Diagnosis not present

## 2022-09-04 DIAGNOSIS — N1832 Chronic kidney disease, stage 3b: Secondary | ICD-10-CM | POA: Diagnosis not present

## 2022-09-04 DIAGNOSIS — N2581 Secondary hyperparathyroidism of renal origin: Secondary | ICD-10-CM | POA: Diagnosis not present

## 2022-09-04 DIAGNOSIS — I10 Essential (primary) hypertension: Secondary | ICD-10-CM | POA: Diagnosis not present

## 2022-09-05 DIAGNOSIS — N1832 Chronic kidney disease, stage 3b: Secondary | ICD-10-CM | POA: Diagnosis not present

## 2022-09-05 DIAGNOSIS — D631 Anemia in chronic kidney disease: Secondary | ICD-10-CM | POA: Diagnosis not present

## 2022-09-17 DIAGNOSIS — I454 Nonspecific intraventricular block: Secondary | ICD-10-CM | POA: Diagnosis not present

## 2022-09-17 DIAGNOSIS — R55 Syncope and collapse: Secondary | ICD-10-CM | POA: Diagnosis not present

## 2022-09-17 DIAGNOSIS — I4510 Unspecified right bundle-branch block: Secondary | ICD-10-CM | POA: Diagnosis not present

## 2022-09-17 DIAGNOSIS — I4430 Unspecified atrioventricular block: Secondary | ICD-10-CM | POA: Diagnosis not present

## 2022-09-17 DIAGNOSIS — R0689 Other abnormalities of breathing: Secondary | ICD-10-CM | POA: Diagnosis not present

## 2022-09-18 NOTE — Progress Notes (Signed)
Remote ICD transmission.   

## 2022-10-03 DIAGNOSIS — E538 Deficiency of other specified B group vitamins: Secondary | ICD-10-CM | POA: Diagnosis not present

## 2022-10-27 DIAGNOSIS — D6869 Other thrombophilia: Secondary | ICD-10-CM | POA: Diagnosis not present

## 2022-10-27 DIAGNOSIS — I4891 Unspecified atrial fibrillation: Secondary | ICD-10-CM | POA: Diagnosis not present

## 2022-10-27 DIAGNOSIS — I509 Heart failure, unspecified: Secondary | ICD-10-CM | POA: Diagnosis not present

## 2022-10-31 DIAGNOSIS — E538 Deficiency of other specified B group vitamins: Secondary | ICD-10-CM | POA: Diagnosis not present

## 2022-11-14 DIAGNOSIS — Z23 Encounter for immunization: Secondary | ICD-10-CM | POA: Diagnosis not present

## 2022-11-16 DIAGNOSIS — J441 Chronic obstructive pulmonary disease with (acute) exacerbation: Secondary | ICD-10-CM | POA: Diagnosis not present

## 2022-11-16 DIAGNOSIS — E1142 Type 2 diabetes mellitus with diabetic polyneuropathy: Secondary | ICD-10-CM | POA: Diagnosis not present

## 2022-11-16 DIAGNOSIS — J449 Chronic obstructive pulmonary disease, unspecified: Secondary | ICD-10-CM | POA: Diagnosis not present

## 2022-11-16 DIAGNOSIS — E1122 Type 2 diabetes mellitus with diabetic chronic kidney disease: Secondary | ICD-10-CM | POA: Diagnosis not present

## 2022-11-16 DIAGNOSIS — E1165 Type 2 diabetes mellitus with hyperglycemia: Secondary | ICD-10-CM | POA: Diagnosis not present

## 2022-11-16 DIAGNOSIS — M109 Gout, unspecified: Secondary | ICD-10-CM | POA: Diagnosis not present

## 2022-11-16 DIAGNOSIS — Z6824 Body mass index (BMI) 24.0-24.9, adult: Secondary | ICD-10-CM | POA: Diagnosis not present

## 2022-11-16 DIAGNOSIS — E1129 Type 2 diabetes mellitus with other diabetic kidney complication: Secondary | ICD-10-CM | POA: Diagnosis not present

## 2022-11-16 DIAGNOSIS — I4891 Unspecified atrial fibrillation: Secondary | ICD-10-CM | POA: Diagnosis not present

## 2022-11-16 DIAGNOSIS — N183 Chronic kidney disease, stage 3 unspecified: Secondary | ICD-10-CM | POA: Diagnosis not present

## 2022-11-16 DIAGNOSIS — I209 Angina pectoris, unspecified: Secondary | ICD-10-CM | POA: Diagnosis not present

## 2022-11-21 DIAGNOSIS — Z6824 Body mass index (BMI) 24.0-24.9, adult: Secondary | ICD-10-CM | POA: Diagnosis not present

## 2022-11-21 DIAGNOSIS — M109 Gout, unspecified: Secondary | ICD-10-CM | POA: Diagnosis not present

## 2022-12-03 ENCOUNTER — Ambulatory Visit (INDEPENDENT_AMBULATORY_CARE_PROVIDER_SITE_OTHER): Payer: PPO

## 2022-12-03 DIAGNOSIS — I472 Ventricular tachycardia, unspecified: Secondary | ICD-10-CM

## 2022-12-03 DIAGNOSIS — I482 Chronic atrial fibrillation, unspecified: Secondary | ICD-10-CM | POA: Diagnosis not present

## 2022-12-03 LAB — CUP PACEART REMOTE DEVICE CHECK
Battery Remaining Longevity: 132 mo
Battery Remaining Percentage: 100 %
Brady Statistic RA Percent Paced: 11 %
Brady Statistic RV Percent Paced: 0 %
Date Time Interrogation Session: 20241007075900
HighPow Impedance: 70 Ohm
Implantable Lead Connection Status: 753985
Implantable Lead Connection Status: 753985
Implantable Lead Implant Date: 20210709
Implantable Lead Implant Date: 20210709
Implantable Lead Location: 753859
Implantable Lead Location: 753860
Implantable Lead Model: 137
Implantable Lead Model: 7841
Implantable Lead Serial Number: 1063971
Implantable Lead Serial Number: 300935
Implantable Pulse Generator Implant Date: 20210709
Lead Channel Impedance Value: 458 Ohm
Lead Channel Impedance Value: 656 Ohm
Lead Channel Setting Pacing Amplitude: 2 V
Lead Channel Setting Pacing Amplitude: 2.2 V
Lead Channel Setting Pacing Pulse Width: 0.4 ms
Lead Channel Setting Sensing Sensitivity: 0.5 mV
Pulse Gen Serial Number: 224993
Zone Setting Status: 755011

## 2022-12-05 DIAGNOSIS — Z6825 Body mass index (BMI) 25.0-25.9, adult: Secondary | ICD-10-CM | POA: Diagnosis not present

## 2022-12-05 DIAGNOSIS — J069 Acute upper respiratory infection, unspecified: Secondary | ICD-10-CM | POA: Diagnosis not present

## 2022-12-05 DIAGNOSIS — Z20828 Contact with and (suspected) exposure to other viral communicable diseases: Secondary | ICD-10-CM | POA: Diagnosis not present

## 2022-12-05 DIAGNOSIS — J441 Chronic obstructive pulmonary disease with (acute) exacerbation: Secondary | ICD-10-CM | POA: Diagnosis not present

## 2022-12-11 DIAGNOSIS — L80 Vitiligo: Secondary | ICD-10-CM | POA: Diagnosis not present

## 2022-12-14 NOTE — Progress Notes (Signed)
Remote ICD transmission.   

## 2022-12-27 DIAGNOSIS — D6869 Other thrombophilia: Secondary | ICD-10-CM | POA: Diagnosis not present

## 2022-12-27 DIAGNOSIS — I4891 Unspecified atrial fibrillation: Secondary | ICD-10-CM | POA: Diagnosis not present

## 2022-12-27 DIAGNOSIS — I10 Essential (primary) hypertension: Secondary | ICD-10-CM | POA: Diagnosis not present

## 2022-12-27 DIAGNOSIS — J449 Chronic obstructive pulmonary disease, unspecified: Secondary | ICD-10-CM | POA: Diagnosis not present

## 2023-01-03 DIAGNOSIS — E538 Deficiency of other specified B group vitamins: Secondary | ICD-10-CM | POA: Diagnosis not present

## 2023-01-26 DIAGNOSIS — J449 Chronic obstructive pulmonary disease, unspecified: Secondary | ICD-10-CM | POA: Diagnosis not present

## 2023-01-26 DIAGNOSIS — I130 Hypertensive heart and chronic kidney disease with heart failure and stage 1 through stage 4 chronic kidney disease, or unspecified chronic kidney disease: Secondary | ICD-10-CM | POA: Diagnosis not present

## 2023-01-26 DIAGNOSIS — N183 Chronic kidney disease, stage 3 unspecified: Secondary | ICD-10-CM | POA: Diagnosis not present

## 2023-01-26 DIAGNOSIS — I209 Angina pectoris, unspecified: Secondary | ICD-10-CM | POA: Diagnosis not present

## 2023-01-26 DIAGNOSIS — I4891 Unspecified atrial fibrillation: Secondary | ICD-10-CM | POA: Diagnosis not present

## 2023-01-26 DIAGNOSIS — D6869 Other thrombophilia: Secondary | ICD-10-CM | POA: Diagnosis not present

## 2023-01-31 DIAGNOSIS — E538 Deficiency of other specified B group vitamins: Secondary | ICD-10-CM | POA: Diagnosis not present

## 2023-03-04 ENCOUNTER — Ambulatory Visit (INDEPENDENT_AMBULATORY_CARE_PROVIDER_SITE_OTHER): Payer: PPO

## 2023-03-04 DIAGNOSIS — I472 Ventricular tachycardia, unspecified: Secondary | ICD-10-CM

## 2023-03-04 DIAGNOSIS — I4901 Ventricular fibrillation: Secondary | ICD-10-CM | POA: Diagnosis not present

## 2023-03-04 LAB — CUP PACEART REMOTE DEVICE CHECK
Battery Remaining Longevity: 126 mo
Battery Remaining Percentage: 100 %
Brady Statistic RA Percent Paced: 8 %
Brady Statistic RV Percent Paced: 0 %
Date Time Interrogation Session: 20250106073500
HighPow Impedance: 72 Ohm
Implantable Lead Connection Status: 753985
Implantable Lead Connection Status: 753985
Implantable Lead Implant Date: 20210709
Implantable Lead Implant Date: 20210709
Implantable Lead Location: 753859
Implantable Lead Location: 753860
Implantable Lead Model: 137
Implantable Lead Model: 7841
Implantable Lead Serial Number: 1063971
Implantable Lead Serial Number: 300935
Implantable Pulse Generator Implant Date: 20210709
Lead Channel Impedance Value: 454 Ohm
Lead Channel Impedance Value: 678 Ohm
Lead Channel Setting Pacing Amplitude: 2 V
Lead Channel Setting Pacing Amplitude: 2.2 V
Lead Channel Setting Pacing Pulse Width: 0.4 ms
Lead Channel Setting Sensing Sensitivity: 0.5 mV
Pulse Gen Serial Number: 224993
Zone Setting Status: 755011

## 2023-03-06 DIAGNOSIS — E538 Deficiency of other specified B group vitamins: Secondary | ICD-10-CM | POA: Diagnosis not present

## 2023-03-21 DIAGNOSIS — Z6825 Body mass index (BMI) 25.0-25.9, adult: Secondary | ICD-10-CM | POA: Diagnosis not present

## 2023-03-21 DIAGNOSIS — E663 Overweight: Secondary | ICD-10-CM | POA: Diagnosis not present

## 2023-03-21 DIAGNOSIS — M109 Gout, unspecified: Secondary | ICD-10-CM | POA: Diagnosis not present

## 2023-04-02 DIAGNOSIS — E538 Deficiency of other specified B group vitamins: Secondary | ICD-10-CM | POA: Diagnosis not present

## 2023-04-10 NOTE — Progress Notes (Signed)
Remote ICD transmission.

## 2023-04-24 ENCOUNTER — Telehealth: Payer: Self-pay | Admitting: Internal Medicine

## 2023-04-24 DIAGNOSIS — E663 Overweight: Secondary | ICD-10-CM | POA: Diagnosis not present

## 2023-04-24 DIAGNOSIS — Z6825 Body mass index (BMI) 25.0-25.9, adult: Secondary | ICD-10-CM | POA: Diagnosis not present

## 2023-04-24 DIAGNOSIS — M109 Gout, unspecified: Secondary | ICD-10-CM | POA: Diagnosis not present

## 2023-04-24 NOTE — Telephone Encounter (Signed)
 Patient walked in and, can't afford pradaxa wants to know what is cheaper alternative.

## 2023-04-30 NOTE — Telephone Encounter (Signed)
 Pt walked into the office today and stated that he changed his insurance and he was told by the insurance agent that Eliquis would be cheaper. The pt is asking to be switched back to Eliquis. [phone] is the best number to reach the pt.

## 2023-04-30 NOTE — Telephone Encounter (Signed)
 Is it OK to start pt back on Eliquis?

## 2023-05-01 MED ORDER — APIXABAN 5 MG PO TABS: 5.0000 mg | ORAL_TABLET | Freq: Two times a day (BID) | ORAL | 5 refills | Status: DC

## 2023-05-01 NOTE — Telephone Encounter (Signed)
 Called patient to inform him new Rx for Eliquis 5mg  has been sent to [facility].  He finished Pradaxa yesterday and will start Eliquis today.

## 2023-05-01 NOTE — Addendum Note (Signed)
 Addended by: Louanna Raw on: 05/01/2023 09:47 AM   Modules accepted: Orders

## 2023-05-01 NOTE — Telephone Encounter (Signed)
 Patient notified of the switch and voiced understanding. Patient had no questions or concerns at this time. Message routed to Vashti Hey, RN in regards to switch medication from Pradaxa to Eliquis 5 mg BID.

## 2023-05-01 NOTE — Telephone Encounter (Signed)
 Ok to transition to Eliquis 5mg  BID. He would take his first Eliquis dose when he would have been due for pradaxa

## 2023-05-02 DIAGNOSIS — E538 Deficiency of other specified B group vitamins: Secondary | ICD-10-CM | POA: Diagnosis not present

## 2023-05-28 DIAGNOSIS — E538 Deficiency of other specified B group vitamins: Secondary | ICD-10-CM | POA: Diagnosis not present

## 2023-06-03 ENCOUNTER — Ambulatory Visit: Payer: PPO

## 2023-06-03 DIAGNOSIS — I472 Ventricular tachycardia, unspecified: Secondary | ICD-10-CM | POA: Diagnosis not present

## 2023-06-04 LAB — CUP PACEART REMOTE DEVICE CHECK
Battery Remaining Longevity: 126 mo
Battery Remaining Percentage: 100 %
Brady Statistic RA Percent Paced: 6 %
Brady Statistic RV Percent Paced: 0 %
Date Time Interrogation Session: 20250407074600
HighPow Impedance: 73 Ohm
Implantable Lead Connection Status: 753985
Implantable Lead Connection Status: 753985
Implantable Lead Implant Date: 20210709
Implantable Lead Implant Date: 20210709
Implantable Lead Location: 753859
Implantable Lead Location: 753860
Implantable Lead Model: 137
Implantable Lead Model: 7841
Implantable Lead Serial Number: 1063971
Implantable Lead Serial Number: 300935
Implantable Pulse Generator Implant Date: 20210709
Lead Channel Impedance Value: 450 Ohm
Lead Channel Impedance Value: 711 Ohm
Lead Channel Setting Pacing Amplitude: 2 V
Lead Channel Setting Pacing Amplitude: 2.2 V
Lead Channel Setting Pacing Pulse Width: 0.4 ms
Lead Channel Setting Sensing Sensitivity: 0.5 mV
Pulse Gen Serial Number: 224993
Zone Setting Status: 755011

## 2023-06-27 DIAGNOSIS — H25813 Combined forms of age-related cataract, bilateral: Secondary | ICD-10-CM | POA: Diagnosis not present

## 2023-06-28 DIAGNOSIS — E538 Deficiency of other specified B group vitamins: Secondary | ICD-10-CM | POA: Diagnosis not present

## 2023-07-15 DIAGNOSIS — M1991 Primary osteoarthritis, unspecified site: Secondary | ICD-10-CM | POA: Diagnosis not present

## 2023-07-15 DIAGNOSIS — M109 Gout, unspecified: Secondary | ICD-10-CM | POA: Diagnosis not present

## 2023-07-15 DIAGNOSIS — Z6823 Body mass index (BMI) 23.0-23.9, adult: Secondary | ICD-10-CM | POA: Diagnosis not present

## 2023-07-15 NOTE — Progress Notes (Signed)
 Remote ICD transmission.

## 2023-07-15 NOTE — Addendum Note (Signed)
 Addended by: Edra Govern D on: 07/15/2023 02:00 PM   Modules accepted: Orders

## 2023-08-01 DIAGNOSIS — E538 Deficiency of other specified B group vitamins: Secondary | ICD-10-CM | POA: Diagnosis not present

## 2023-08-07 DIAGNOSIS — J209 Acute bronchitis, unspecified: Secondary | ICD-10-CM | POA: Diagnosis not present

## 2023-08-07 DIAGNOSIS — I10 Essential (primary) hypertension: Secondary | ICD-10-CM | POA: Diagnosis not present

## 2023-08-07 DIAGNOSIS — I130 Hypertensive heart and chronic kidney disease with heart failure and stage 1 through stage 4 chronic kidney disease, or unspecified chronic kidney disease: Secondary | ICD-10-CM | POA: Diagnosis not present

## 2023-08-07 DIAGNOSIS — J441 Chronic obstructive pulmonary disease with (acute) exacerbation: Secondary | ICD-10-CM | POA: Diagnosis not present

## 2023-08-07 DIAGNOSIS — Z6823 Body mass index (BMI) 23.0-23.9, adult: Secondary | ICD-10-CM | POA: Diagnosis not present

## 2023-08-29 DIAGNOSIS — Z20828 Contact with and (suspected) exposure to other viral communicable diseases: Secondary | ICD-10-CM | POA: Diagnosis not present

## 2023-08-29 DIAGNOSIS — E538 Deficiency of other specified B group vitamins: Secondary | ICD-10-CM | POA: Diagnosis not present

## 2023-08-29 DIAGNOSIS — R6889 Other general symptoms and signs: Secondary | ICD-10-CM | POA: Diagnosis not present

## 2023-08-29 DIAGNOSIS — Z6823 Body mass index (BMI) 23.0-23.9, adult: Secondary | ICD-10-CM | POA: Diagnosis not present

## 2023-08-29 DIAGNOSIS — J209 Acute bronchitis, unspecified: Secondary | ICD-10-CM | POA: Diagnosis not present

## 2023-09-02 ENCOUNTER — Ambulatory Visit (INDEPENDENT_AMBULATORY_CARE_PROVIDER_SITE_OTHER): Payer: PPO

## 2023-09-02 DIAGNOSIS — I472 Ventricular tachycardia, unspecified: Secondary | ICD-10-CM

## 2023-09-02 LAB — CUP PACEART REMOTE DEVICE CHECK
Battery Remaining Longevity: 120 mo
Battery Remaining Percentage: 100 %
Brady Statistic RA Percent Paced: 5 %
Brady Statistic RV Percent Paced: 0 %
Date Time Interrogation Session: 20250707080000
HighPow Impedance: 77 Ohm
Implantable Lead Connection Status: 753985
Implantable Lead Connection Status: 753985
Implantable Lead Implant Date: 20210709
Implantable Lead Implant Date: 20210709
Implantable Lead Location: 753859
Implantable Lead Location: 753860
Implantable Lead Model: 137
Implantable Lead Model: 7841
Implantable Lead Serial Number: 1063971
Implantable Lead Serial Number: 300935
Implantable Pulse Generator Implant Date: 20210709
Lead Channel Impedance Value: 468 Ohm
Lead Channel Impedance Value: 702 Ohm
Lead Channel Setting Pacing Amplitude: 2 V
Lead Channel Setting Pacing Amplitude: 2.2 V
Lead Channel Setting Pacing Pulse Width: 0.4 ms
Lead Channel Setting Sensing Sensitivity: 0.5 mV
Pulse Gen Serial Number: 224993
Zone Setting Status: 755011

## 2023-09-05 ENCOUNTER — Ambulatory Visit: Payer: Self-pay | Admitting: Internal Medicine

## 2023-09-30 DIAGNOSIS — E538 Deficiency of other specified B group vitamins: Secondary | ICD-10-CM | POA: Diagnosis not present

## 2023-10-29 DIAGNOSIS — E538 Deficiency of other specified B group vitamins: Secondary | ICD-10-CM | POA: Diagnosis not present

## 2023-12-02 ENCOUNTER — Ambulatory Visit: Payer: PPO

## 2023-12-02 DIAGNOSIS — J449 Chronic obstructive pulmonary disease, unspecified: Secondary | ICD-10-CM | POA: Diagnosis not present

## 2023-12-02 DIAGNOSIS — J069 Acute upper respiratory infection, unspecified: Secondary | ICD-10-CM | POA: Diagnosis not present

## 2023-12-02 DIAGNOSIS — I472 Ventricular tachycardia, unspecified: Secondary | ICD-10-CM

## 2023-12-02 DIAGNOSIS — D6859 Other primary thrombophilia: Secondary | ICD-10-CM | POA: Diagnosis not present

## 2023-12-02 DIAGNOSIS — Z6823 Body mass index (BMI) 23.0-23.9, adult: Secondary | ICD-10-CM | POA: Diagnosis not present

## 2023-12-02 DIAGNOSIS — E785 Hyperlipidemia, unspecified: Secondary | ICD-10-CM | POA: Diagnosis not present

## 2023-12-02 DIAGNOSIS — E039 Hypothyroidism, unspecified: Secondary | ICD-10-CM | POA: Diagnosis not present

## 2023-12-02 DIAGNOSIS — E538 Deficiency of other specified B group vitamins: Secondary | ICD-10-CM | POA: Diagnosis not present

## 2023-12-04 LAB — CUP PACEART REMOTE DEVICE CHECK
Battery Remaining Longevity: 120 mo
Battery Remaining Percentage: 100 %
Brady Statistic RA Percent Paced: 4 %
Brady Statistic RV Percent Paced: 0 %
Date Time Interrogation Session: 20251006024700
HighPow Impedance: 70 Ohm
Implantable Lead Connection Status: 753985
Implantable Lead Connection Status: 753985
Implantable Lead Implant Date: 20210709
Implantable Lead Implant Date: 20210709
Implantable Lead Location: 753859
Implantable Lead Location: 753860
Implantable Lead Model: 137
Implantable Lead Model: 7841
Implantable Lead Serial Number: 1063971
Implantable Lead Serial Number: 300935
Implantable Pulse Generator Implant Date: 20210709
Lead Channel Impedance Value: 456 Ohm
Lead Channel Impedance Value: 483 Ohm
Lead Channel Setting Pacing Amplitude: 2 V
Lead Channel Setting Pacing Amplitude: 2.2 V
Lead Channel Setting Pacing Pulse Width: 0.4 ms
Lead Channel Setting Sensing Sensitivity: 0.5 mV
Pulse Gen Serial Number: 224993
Zone Setting Status: 755011

## 2023-12-05 ENCOUNTER — Ambulatory Visit: Payer: Self-pay | Admitting: Internal Medicine

## 2023-12-05 NOTE — Progress Notes (Signed)
 Remote ICD Transmission

## 2023-12-09 NOTE — Progress Notes (Signed)
 Remote ICD Transmission

## 2023-12-22 ENCOUNTER — Emergency Department (HOSPITAL_COMMUNITY)

## 2023-12-22 ENCOUNTER — Encounter (HOSPITAL_COMMUNITY): Payer: Self-pay

## 2023-12-22 ENCOUNTER — Emergency Department (HOSPITAL_COMMUNITY)
Admission: EM | Admit: 2023-12-22 | Discharge: 2023-12-22 | Disposition: A | Discharge status: Home / Self Care | Attending: Emergency Medicine | Admitting: Emergency Medicine

## 2023-12-22 ENCOUNTER — Other Ambulatory Visit: Payer: Self-pay

## 2023-12-22 DIAGNOSIS — C771 Secondary and unspecified malignant neoplasm of intrathoracic lymph nodes: Secondary | ICD-10-CM | POA: Diagnosis not present

## 2023-12-22 DIAGNOSIS — I482 Chronic atrial fibrillation, unspecified: Secondary | ICD-10-CM | POA: Diagnosis not present

## 2023-12-22 DIAGNOSIS — M1612 Unilateral primary osteoarthritis, left hip: Secondary | ICD-10-CM | POA: Diagnosis not present

## 2023-12-22 DIAGNOSIS — M1712 Unilateral primary osteoarthritis, left knee: Secondary | ICD-10-CM | POA: Diagnosis not present

## 2023-12-22 DIAGNOSIS — Z79899 Other long term (current) drug therapy: Secondary | ICD-10-CM | POA: Insufficient documentation

## 2023-12-22 DIAGNOSIS — M898X5 Other specified disorders of bone, thigh: Secondary | ICD-10-CM | POA: Insufficient documentation

## 2023-12-22 DIAGNOSIS — E119 Type 2 diabetes mellitus without complications: Secondary | ICD-10-CM | POA: Diagnosis not present

## 2023-12-22 DIAGNOSIS — I10 Essential (primary) hypertension: Secondary | ICD-10-CM | POA: Insufficient documentation

## 2023-12-22 DIAGNOSIS — M898X9 Other specified disorders of bone, unspecified site: Secondary | ICD-10-CM

## 2023-12-22 DIAGNOSIS — Z7901 Long term (current) use of anticoagulants: Secondary | ICD-10-CM | POA: Diagnosis not present

## 2023-12-22 DIAGNOSIS — C7951 Secondary malignant neoplasm of bone: Secondary | ICD-10-CM | POA: Diagnosis not present

## 2023-12-22 DIAGNOSIS — R918 Other nonspecific abnormal finding of lung field: Secondary | ICD-10-CM | POA: Diagnosis not present

## 2023-12-22 DIAGNOSIS — S32592A Other specified fracture of left pubis, initial encounter for closed fracture: Secondary | ICD-10-CM | POA: Diagnosis not present

## 2023-12-22 DIAGNOSIS — C801 Malignant (primary) neoplasm, unspecified: Secondary | ICD-10-CM | POA: Diagnosis not present

## 2023-12-22 DIAGNOSIS — M25552 Pain in left hip: Secondary | ICD-10-CM | POA: Diagnosis not present

## 2023-12-22 LAB — BASIC METABOLIC PANEL WITH GFR
Anion gap: 10 (ref 5–15)
BUN: 17 mg/dL (ref 8–23)
CO2: 28 mmol/L (ref 22–32)
Calcium: 9.7 mg/dL (ref 8.9–10.3)
Chloride: 102 mmol/L (ref 98–111)
Creatinine, Ser: 1.21 mg/dL (ref 0.61–1.24)
GFR, Estimated: >60 mL/min (ref >60)
Glucose, Bld: 96 mg/dL (ref 70–99)
Potassium: 4 mmol/L (ref 3.5–5.1)
Sodium: 140 mmol/L (ref 135–145)

## 2023-12-22 LAB — CBC WITH DIFFERENTIAL/PLATELET
Abs Immature Granulocytes: 0.04 K/uL (ref 0.00–0.07)
Basophils Absolute: 0.1 K/uL (ref 0.0–0.1)
Basophils Relative: 1 %
Eosinophils Absolute: 0.2 K/uL (ref 0.0–0.5)
Eosinophils Relative: 2 %
HCT: 33 % — ABNORMAL LOW (ref 39.0–52.0)
Hemoglobin: 10.2 g/dL — ABNORMAL LOW (ref 13.0–17.0)
Immature Granulocytes: 1 %
Lymphocytes Relative: 11 %
Lymphs Abs: 0.8 K/uL (ref 0.7–4.0)
MCH: 26.5 pg (ref 26.0–34.0)
MCHC: 30.9 g/dL (ref 30.0–36.0)
MCV: 85.7 fL (ref 80.0–100.0)
Monocytes Absolute: 1.2 K/uL — ABNORMAL HIGH (ref 0.1–1.0)
Monocytes Relative: 16 %
Neutro Abs: 5.2 K/uL (ref 1.7–7.7)
Neutrophils Relative %: 69 %
Platelets: 259 K/uL (ref 150–400)
RBC: 3.85 MIL/uL — ABNORMAL LOW (ref 4.22–5.81)
RDW: 14.6 % (ref 11.5–15.5)
WBC: 7.5 K/uL (ref 4.0–10.5)
nRBC: 0 % (ref 0.0–0.2)

## 2023-12-22 LAB — D-DIMER, QUANTITATIVE: D-Dimer, Quant: 1.48 ug{FEU}/mL — ABNORMAL HIGH (ref 0.00–0.50)

## 2023-12-22 MED ORDER — FENTANYL CITRATE (PF) 100 MCG/2ML IJ SOLN
50.0000 ug | Freq: Once | INTRAMUSCULAR | Status: DC
Start: 2023-12-22 — End: 2023-12-22

## 2023-12-22 MED ORDER — OXYCODONE-ACETAMINOPHEN 5-325 MG PO TABS: 1.0000 | ORAL_TABLET | Freq: Four times a day (QID) | ORAL | 0 refills | Status: DC | PRN

## 2023-12-22 MED ORDER — ACETAMINOPHEN 500 MG PO TABS
1000.0000 mg | ORAL_TABLET | Freq: Once | ORAL | Status: AC
  Administered 2023-12-22: 1000 mg via ORAL
  Filled 2023-12-22: qty 2

## 2023-12-22 MED ORDER — KETOROLAC TROMETHAMINE 15 MG/ML IJ SOLN: 15.0000 mg | Freq: Once | INTRAMUSCULAR | Status: DC

## 2023-12-22 MED ORDER — IOHEXOL 300 MG/ML  SOLN
100.0000 mL | Freq: Once | INTRAMUSCULAR | Status: AC | PRN
  Administered 2023-12-22: 100 mL via INTRAVENOUS

## 2023-12-22 MED ORDER — LIDOCAINE 5 % EX PTCH: 1.0000 | MEDICATED_PATCH | CUTANEOUS | Status: DC

## 2023-12-22 NOTE — ED Provider Notes (Signed)
 [place] EMERGENCY DEPARTMENT AT [facility] Provider Note   CSN: [id number] Arrival date & time: 12/22/23  1611     Patient presents with: Hip Pain   Peter Lambert is a 78 y.o. male with history of hypertension, hyperlipidemia, type 2 diabetes, A-fib on Eliquis , PAD, presents with concern for left hip and inner thigh pain that has been ongoing for the past 2 to 3 weeks.  Denies any known injury.  Reports pain sometimes seems better when he moves his hip around.  He denies any bulges in his groin area.  Denies any leg swelling.  Denies any numbness in the lower extremities.    Hip Pain       Prior to Admission medications   Medication Sig Start Date End Date Taking? Authorizing Provider  acetaminophen  (TYLENOL ) 325 MG tablet Take 2 tablets (650 mg total) by mouth every 6 (six) hours as needed for mild pain, fever or headache (or Fever >/= 101). 04/28/19   Pearlean Manus, MD  albuterol  (VENTOLIN  HFA) 108 (90 Base) MCG/ACT inhaler Inhale 2 puffs into the lungs every 4 (four) hours as needed for wheezing or shortness of breath. 04/28/19   Pearlean Manus, MD  amiodarone  (PACERONE ) 100 MG tablet Take 1 tablet (100 mg total) by mouth daily. 05/04/20 07/18/22  Shona Terry SAILOR, DO  apixaban  (ELIQUIS ) 5 MG TABS tablet Take 1 tablet (5 mg total) by mouth 2 (two) times daily. 05/01/23   Court Dorn PARAS, MD  atorvastatin  (LIPITOR ) 80 MG tablet Take 80 mg by mouth daily.    [provider]  carvedilol  (COREG ) 3.125 MG tablet Take 1 tablet (3.125 mg total) by mouth daily with breakfast. 05/04/20 07/18/22  Shona Terry SAILOR, DO  [facility] GENTLE LAXATIVE 5 MG EC tablet TAKE 1 TABLET BY MOUTH DAILY AS NEEDED FOR MODERATE CONSTIPATION. TAKE TWO DAYS BEFORE PROCEDURE 05/30/20   Eartha Flavors, Toribio, MD  cyanocobalamin  (,VITAMIN B-12,) 1000 MCG/ML injection Inject 1,000 mcg into the muscle every 30 (thirty) days.    [provider]  cyanocobalamin  1000 MCG tablet Take 1,000 mcg by  mouth daily.    [provider]  ferrous sulfate  (FERROUSUL) 325 (65 FE) MG tablet Take 1 tablet (325 mg total) by mouth daily with breakfast. 01/19/20   Rehman, Claudis PENNER, MD  furosemide  (LASIX ) 20 MG tablet Take 20 mg by mouth daily.    [provider]  guaiFENesin  (MUCINEX ) 600 MG 12 hr tablet Take 600 mg by mouth 2 (two) times daily as needed for cough or to loosen phlegm.    [provider]  losartan  (COZAAR ) 25 MG tablet Take 25 mg by mouth daily. 01/14/21   [provider]  nitroGLYCERIN  (NITROSTAT ) 0.4 MG SL tablet Place 1 tablet (0.4 mg total) under the tongue every 5 (five) minutes as needed for chest pain. Patient must keep upcoming appointment for further refills 07/20/21   Court Dorn PARAS, MD  omeprazole  (PRILOSEC) 20 MG capsule Take 20 mg by mouth daily.    [provider]  potassium chloride  SA (KLOR-CON ) 20 MEQ tablet Take 20 mEq by mouth daily.    [provider]  spironolactone  (ALDACTONE ) 50 MG tablet Take 50 mg by mouth daily. 02/11/21   [provider]  potassium chloride  (KLOR-CON ) 20 MEQ packet TAKE 1 TABLET BY MOUTH EVERY DAY 12/24/12 07/14/13  Court Dorn PARAS, MD    Allergies: Patient has no known allergies.    Review of Systems  Musculoskeletal:  Left hip pain    Updated Vital Signs BP (!) 167/82   Pulse 88   Temp 98.3 F (36.8 C) (Oral)   Resp 16   Ht 5' 8 (1.727 m)   Wt 75.8 kg   SpO2 100%   BMI 25.39 kg/m   Physical Exam Vitals and nursing note reviewed.  Constitutional:      General: He is not in acute distress.    Appearance: He is well-developed.  HENT:     Head: Normocephalic and atraumatic.  Eyes:     Conjunctiva/sclera: Conjunctivae normal.  Cardiovascular:     Rate and Rhythm: Normal rate and regular rhythm.     Heart sounds: No murmur heard.    Comments: 2+ radial pulse and pedal pulse bilaterally Pulmonary:     Effort: Pulmonary effort is normal. No respiratory  distress.     Breath sounds: Normal breath sounds.  Abdominal:     Palpations: Abdomen is soft.     Tenderness: There is no abdominal tenderness.     Comments: No hernias noted  Musculoskeletal:        General: No swelling.     Cervical back: Neck supple.     Comments: No tenderness palpation of the cervical, thoracic, lumbar spine.  Mild tenderness over the left side of pelvis diffusely  He reports pain in the left inner thigh area over the muscle of the inner thigh.  No tenderness when palpated on.  Moves arms and legs without difficulty.  Able to ambulate at baseline with cane.   No edema in the right or left lower extremity, no calf tenderness to palpation.  Skin:    General: Skin is warm and dry.     Capillary Refill: Capillary refill takes less than 2 seconds.  Neurological:     Mental Status: He is alert.  Psychiatric:        Mood and Affect: Mood normal.     (all labs ordered are listed, but only abnormal results are displayed) Labs Reviewed  D-DIMER, QUANTITATIVE - Abnormal; Notable for the following components:      Result Value   D-Dimer, Quant 1.48 (*)    All other components within normal limits  CBC WITH DIFFERENTIAL/PLATELET - Abnormal; Notable for the following components:   RBC 3.85 (*)    Hemoglobin 10.2 (*)    HCT 33.0 (*)    Monocytes Absolute 1.2 (*)    All other components within normal limits  BASIC METABOLIC PANEL WITH GFR    EKG: None  Radiology: CT PELVIS WO CONTRAST Result Date: 12/22/2023 CLINICAL DATA:  Possible lucent lesions on x-ray. Left hip and left thigh pain. No known injury. EXAM: CT PELVIS WITHOUT CONTRAST TECHNIQUE: Multidetector CT imaging of the pelvis was performed following the standard protocol without intravenous contrast. RADIATION DOSE REDUCTION: This exam was performed according to the departmental dose-optimization program which includes automated exposure control, adjustment of the mA and/or kV according to patient size  and/or use of iterative reconstruction technique. COMPARISON:  12/22/2023. FINDINGS: Urinary Tract:  No abnormality visualized. Bowel: Unremarkable visualized pelvic bowel loops. Appendix appears normal. Vascular/Lymphatic: Findings suggestive of aortobifemoral bypass grafts. No pelvic lymphadenopathy by size criteria. Reproductive:  The prostate gland is enlarged. Other:  No ascites. Musculoskeletal: No acute fracture or dislocation is seen. Multiple lytic lesions are seen scattered throughout the bones. There are large destructive lesions with soft tissue masses is involving the sacrum on the right, acetabulum on the left, and inferior pubic ramus  on the left. Evaluation of the masses is limited due to lack of IV contrast. However the largest soft tissue mass is seen in the region the inferior pubic ramus on the left measuring 5.8 x 5.5 cm. IMPRESSION: Multiple lytic lesions scattered throughout the bones with large destructive lesionsand associated soft tissue masses involving the sacrum on the right, acetabulum on the left, and inferior pubic ramus on the left. No pathologic fracture is seen. Differential diagnosis includes multiple myeloma/plasmacytoma versus metastatic disease. Electronically Signed   By: Leita Birmingham M.D.   On: 12/22/2023 18:31   DG Femur Min 2 Views Left Result Date: 12/22/2023 EXAM: 2 VIEW(S) XRAY OF THE LEFT FEMUR 12/22/2023 05:53:00 PM COMPARISON: None available. CLINICAL HISTORY: left hip/femur pain. Per triage ;  Pt arrives with c/o left hip pain that started 2-3 weeks ago. Pt reports pain radiates into his leg and worse with movement. Pt denies falling or injury. ; Best lateral obtainable, pt had trouble staying still once in position FINDINGS: BONES AND JOINTS: Left inferior pubic ramus appears fractured and fragmented with some areas of focal lucency. The left femur appears intact. There are mild degenerative changes of the hip and knee. No joint dislocation. SOFT TISSUES:  Peripheral vascular calcifications are present. IMPRESSION: 1. Fracture and fragmentation of the left inferior pubic ramus with focal lucency. 2. Mild degenerative changes of the hip and knee. Electronically signed by: Greig Pique MD 12/22/2023 06:10 PM EDT RP Workstation: [id number]   DG Pelvis 1-2 Views Result Date: 12/22/2023 EXAM: 1 or 2 VIEW(S) XRAY OF THE PELVIS 12/22/2023 04:57:18 PM COMPARISON: None available. CLINICAL HISTORY: Left hip pain. FINDINGS: BONES AND JOINTS: Questionable erosive changes and lucent lesions in the left inferior pubic ramus. No joint dislocation. SOFT TISSUES: Surgical clips overlie the bilateral inguinal regions. Peripheral vascular calcifications are present. STOMACH AND BOWEL: Diffuse gaseous distention of visualized colon. IMPRESSION: 1. Questionable erosive changes and lucent lesions in the left inferior pubic ramus. Recommend further evaluation with CT. 2. Diffuse gaseous distention of visualized colon. Electronically signed by: Greig Pique MD 12/22/2023 05:19 PM EDT RP Workstation: [id number]     Procedures   Medications Ordered in the ED  acetaminophen  (TYLENOL ) tablet 1,000 mg (1,000 mg Oral Given 12/22/23 1820)                                    Medical Decision Making Amount and/or Complexity of Data Reviewed Labs: ordered. Radiology: ordered.  Risk OTC drugs.     Differential diagnosis includes but is not limited to fracture, dislocation, muscle strain, radiculopathy, DVT  ED Course:  Upon initial evaluation, patient is well-appearing, no acute distress.  Stable vitals aside from his elevated blood pressure of 155/83.  On exam, he has mild tenderness over the left hip, but no point tenderness to palpation.  No tenderness of the cervical, thoracic, lumbar spine.  The reports some pain in the muscle of the left inner thigh, but not tender when I press here.  Abdomen soft nontender.  I do not appreciate any hernias such as inguinal hernia  on exam.  His lower extremities are without edema.  No calf tenderness to palpation.  2+ pedal pulses bilaterally.  I have lower concern for DVT clinically at this time, but will obtain D-dimer for further evaluation since ultrasound is not available right now.  Labs Ordered: I Ordered, and personally interpreted labs.  The pertinent results  include:   CBC with hemoglobin at 10.2, appears to be at patient's baseline.  No leukocytosis BMP within normal limits D-dimer elevated at 1.48  Imaging Studies ordered: I ordered imaging studies including pelvic x-ray, x-ray left femur, CT pelvis I independently visualized the imaging with scope of interpretation limited to determining acute life threatening conditions related to emergency care. Imaging showed  X-ray pelvis: IMPRESSION:  1. Questionable erosive changes and lucent lesions in the left inferior pubic  ramus. Recommend further evaluation with CT.  2. Diffuse gaseous distention of visualized colon.   CT pelvis: IMPRESSION:  Multiple lytic lesions scattered throughout the bones with large  destructive lesionsand associated soft tissue masses involving the  sacrum on the right, acetabulum on the left, and inferior pubic  ramus on the left. No pathologic fracture is seen. Differential  diagnosis includes multiple myeloma/plasmacytoma versus metastatic  disease.   X-ray left femur IMPRESSION:  1. Fracture and fragmentation of the left inferior pubic ramus with focal  lucency.  2. Mild degenerative changes of the hip and knee.   I agree with the radiologist interpretation   Medications Given: Tylenol   Upon re-evaluation, patient reports pain well-controlled with Tylenol .  He does not feel he needs any additional pain medicine at this time.  I discussed with patient that his CT and x-ray findings are concerning for cancer in the bones of his pelvis.  Question multiple myeloma versus metastatic disease.  He otherwise reports feeling  well, denies pain elsewhere.  He does report some weight loss over the past couple months, but he is unable to quantify how much.  He denies any night sweats.  He reports he thinks his mom had cancer, but unsure what type of cancer.  He is a smoker. Will obtain CT chest abdomen pelvis imaging to see if we can locate primary source of his cancer. D-dimer is elevated, but lower concern for DVT at this time given explanation to patient's hip pain from the lytic lesions.  He is also on Eliquis , so lower concern for DVT.  He has 2+ pedal pulses bilaterally.    Impression:   Disposition:  Care of this patient signed out to oncoming ED provider Dr. Bernarda Pereyra to follow-up on CT scan imaging. Disposition and treatment plan pending imaging results and clinical judgment of oncoming ED team.     This chart was dictated using voice recognition software, Dragon. Despite the best efforts of this provider to proofread and correct errors, errors may still occur which can change documentation meaning.       Final diagnoses:  Lytic bone lesions on xray    ED Discharge Orders     None          Veta Palma, PA-C 12/22/23 1857    Pereyra Bernarda SQUIBB, DO 12/30/23 0015

## 2023-12-22 NOTE — ED Triage Notes (Signed)
 Pt arrives with c/o left hip pain that started 2-3 weeks ago. Pt reports pain radiates into his leg and worse with movement. Pt denies falling or injury.

## 2023-12-22 NOTE — Discharge Instructions (Addendum)
 Spoke with our oncologist Dr. Chinita Patten he was going to reach out to his team to organize a follow-up visit for you.  Please expect a call from them in the next 2 days.  If you do not receive a call from the office for a follow-up visit for your the bony lesions in your hips and lung mass suspicious for cancer please call them.  Phone number is provided below.  Oxycodone was given upon discharge for pain management.  This medication is a narcotic.  Please not drink alcohol or drive while on this medication.  Take only as prescribed.  This medication has Tylenol  in it so do not take any acetaminophen  and/or Tylenol  in addition to it.

## 2023-12-22 NOTE — ED Notes (Signed)
 Patient transported to CT

## 2023-12-22 NOTE — ED Notes (Signed)
  at bedside

## 2023-12-22 NOTE — ED Provider Notes (Signed)
 Patient came in for hip pain and was found to have lytic lesions in the acetabulum.  CT chest abdomen pelvis demonstrated lung mass concerning for bronchogenic carcinoma.  I do not believe that our interventional radiology team at [facility] performs these biopsies.  I spoke with our on-call oncologist Dr. Chinita Pasam who states he is going to talk with his colleagues and establish outpatient follow-up for the patient.  I have given both the patient and his son the instructions to call the office if they do not hear from them for follow-up in the next 2 days.   Elnor Bernarda SQUIBB, DO 12/22/23 2119

## 2023-12-23 MED FILL — Oxycodone w/ Acetaminophen Tab 5-325 MG: ORAL | Qty: 6 | Status: AC

## 2023-12-27 ENCOUNTER — Encounter: Payer: Self-pay | Admitting: Oncology

## 2023-12-27 ENCOUNTER — Inpatient Hospital Stay

## 2023-12-27 ENCOUNTER — Inpatient Hospital Stay: Attending: Oncology | Admitting: Oncology

## 2023-12-27 VITALS — BP 130/80 | HR 59 | Temp 97.5°F | Resp 18 | Ht 68.0 in | Wt 143.3 lb

## 2023-12-27 DIAGNOSIS — R918 Other nonspecific abnormal finding of lung field: Secondary | ICD-10-CM | POA: Insufficient documentation

## 2023-12-27 DIAGNOSIS — Z8042 Family history of malignant neoplasm of prostate: Secondary | ICD-10-CM | POA: Insufficient documentation

## 2023-12-27 DIAGNOSIS — Z800 Family history of malignant neoplasm of digestive organs: Secondary | ICD-10-CM | POA: Insufficient documentation

## 2023-12-27 DIAGNOSIS — N1832 Chronic kidney disease, stage 3b: Secondary | ICD-10-CM | POA: Diagnosis not present

## 2023-12-27 DIAGNOSIS — E1122 Type 2 diabetes mellitus with diabetic chronic kidney disease: Secondary | ICD-10-CM | POA: Insufficient documentation

## 2023-12-27 DIAGNOSIS — Z79899 Other long term (current) drug therapy: Secondary | ICD-10-CM | POA: Diagnosis not present

## 2023-12-27 DIAGNOSIS — R0602 Shortness of breath: Secondary | ICD-10-CM

## 2023-12-27 DIAGNOSIS — Z7901 Long term (current) use of anticoagulants: Secondary | ICD-10-CM | POA: Diagnosis not present

## 2023-12-27 DIAGNOSIS — I129 Hypertensive chronic kidney disease with stage 1 through stage 4 chronic kidney disease, or unspecified chronic kidney disease: Secondary | ICD-10-CM | POA: Insufficient documentation

## 2023-12-27 DIAGNOSIS — M25552 Pain in left hip: Secondary | ICD-10-CM

## 2023-12-27 DIAGNOSIS — M25559 Pain in unspecified hip: Secondary | ICD-10-CM | POA: Insufficient documentation

## 2023-12-27 LAB — COMPREHENSIVE METABOLIC PANEL WITH GFR
ALT: 30 U/L (ref 0–44)
AST: 49 U/L — ABNORMAL HIGH (ref 15–41)
Albumin: 3.8 g/dL (ref 3.5–5.0)
Alkaline Phosphatase: 117 U/L (ref 38–126)
Anion gap: 15 (ref 5–15)
BUN: 35 mg/dL — ABNORMAL HIGH (ref 8–23)
CO2: 24 mmol/L (ref 22–32)
Calcium: 10.5 mg/dL — ABNORMAL HIGH (ref 8.9–10.3)
Chloride: 100 mmol/L (ref 98–111)
Creatinine, Ser: 2.2 mg/dL — ABNORMAL HIGH (ref 0.61–1.24)
GFR, Estimated: 30 mL/min — ABNORMAL LOW (ref >60)
Glucose, Bld: 120 mg/dL — ABNORMAL HIGH (ref 70–99)
Potassium: 4.3 mmol/L (ref 3.5–5.1)
Sodium: 139 mmol/L (ref 135–145)
Total Bilirubin: 0.4 mg/dL (ref 0.0–1.2)
Total Protein: 8 g/dL (ref 6.5–8.1)

## 2023-12-27 LAB — CBC WITH DIFFERENTIAL/PLATELET
Abs Immature Granulocytes: 0.09 K/uL — ABNORMAL HIGH (ref 0.00–0.07)
Basophils Absolute: 0.1 K/uL (ref 0.0–0.1)
Basophils Relative: 1 %
Eosinophils Absolute: 0.2 K/uL (ref 0.0–0.5)
Eosinophils Relative: 2 %
HCT: 33.8 % — ABNORMAL LOW (ref 39.0–52.0)
Hemoglobin: 10.6 g/dL — ABNORMAL LOW (ref 13.0–17.0)
Immature Granulocytes: 1 %
Lymphocytes Relative: 9 %
Lymphs Abs: 1 K/uL (ref 0.7–4.0)
MCH: 27 pg (ref 26.0–34.0)
MCHC: 31.4 g/dL (ref 30.0–36.0)
MCV: 86.2 fL (ref 80.0–100.0)
Monocytes Absolute: 1.3 K/uL — ABNORMAL HIGH (ref 0.1–1.0)
Monocytes Relative: 12 %
Neutro Abs: 8.5 K/uL — ABNORMAL HIGH (ref 1.7–7.7)
Neutrophils Relative %: 75 %
Platelets: 372 K/uL (ref 150–400)
RBC: 3.92 MIL/uL — ABNORMAL LOW (ref 4.22–5.81)
RDW: 14.9 % (ref 11.5–15.5)
WBC: 11.1 K/uL — ABNORMAL HIGH (ref 4.0–10.5)
nRBC: 0 % (ref 0.0–0.2)

## 2023-12-27 MED ORDER — FENTANYL 25 MCG/HR TD PT72: 1.0000 | MEDICATED_PATCH | TRANSDERMAL | 0 refills | Status: AC

## 2023-12-27 MED ORDER — ONDANSETRON HCL 8 MG PO TABS: 8.0000 mg | ORAL_TABLET | Freq: Three times a day (TID) | ORAL | 0 refills | Status: DC | PRN

## 2023-12-27 NOTE — Assessment & Plan Note (Addendum)
 Likely secondary to mass. Walked patient in clinic and oxygen  dropped below 85% with ambulation without O2. Will get home oxygen  ordered for patient. Recommend starting on 2 L and checking pulse ox at home to maintain of oxygen  level greater than 95%.

## 2023-12-27 NOTE — Assessment & Plan Note (Addendum)
 Likely due to metastatic lesion. Patient was prescribed Percocet from ED which he states makes him feel tired and does not really help. We discussed starting a long-acting pain medicine such as fentanyl  25 mcg transdermally every 72 hours.  Can increase as needed based on tolerance.  We discussed he also can take his fast acting pain medicine as prescribed by ED.

## 2023-12-27 NOTE — Assessment & Plan Note (Addendum)
#  Lung mass: --Differentials include bronchogenic primary, metastatic lung lesion and benign lesion --Referral to pulmonology for evaluation of EBUS with biopsy --Recommend PET/CT scan for further evaluation and staging --Labs today to check CBC and CMP.  --RTC once workup is complete.

## 2023-12-27 NOTE — Patient Instructions (Addendum)
 [facility]  Discharge Instructions   Rapid Diagnostic Service Visit Discharge Information and Instructions  Thank you for choosing [facility] for your healthcare needs.  Below is a summary of today's discussion, along with our contact information and an outline of what to expect next.  Reason for Visit:  bone lesions/lung mass  Proposed Diagnostic Care Plan: Labs today. We will schedule you for a PET scan. We will send a referral to pulmonology for a biopsy of the lung mass.  We sent in Fentanyl  patches for pain management.   We will contact [facility] for oxygen  supplementation at home.  What to Expect: - Generally, when lab tests are ordered the results can take up to 1 week for results to be available.  At that point, we will contact you to discuss your results with you.  Unless there is a critical result, we will typically wait for all of your lab results to be available before contacting you. - If a biopsy is part of your Care Plan, those results can take on average 7-10 days to result.  Once results are available, we will contact you to discuss your pathology results and any next steps. - If you have additional imaging ordered, such as a CT Scan, MRI, Ultrasound, Bone Scan, or PET scan, your imaging will need to be authorized then scheduled with the earliest available appointment.  You may be asked to travel to another hospital within [facility] who has a sooner availability, please consider doing so if asked. - If you use MyChart, your results will be available to you in the MyChart portal.  Your provider will be in touch with you as soon as all of your results are available to be discussed.  Your Diagnostic Clinic Provider:  Delon Hope, NP. Your Diagnostic Navigator:  Dena Daring, RN.  Contact number [phone].   If you or your caregiver have number blocking on your cell phones, please ensure the cancer center's numbers are not  blocked.  If you are not a registered MyChart user, please consider enrolling in MyChart to receive your test results and visit notes.  You can also access your discharge instructions electronically.  MyChart also gives you an electronic means to communicate with your Care Team instead of needing to call in to the cancer center.  We appreciate you trusting us  with your healthcare and look forward to partnering with you as we work to uncover what your potential diagnosis may be.  Please do not hesitate to reach out at any point with questions or concerns.     Thank you for choosing [facility] to provide your oncology and hematology care.   To afford each patient quality time with our provider, please arrive at least 15 minutes before your scheduled appointment time. You may need to reschedule your appointment if you arrive late (10 or more minutes). Arriving late affects you and other patients whose appointments are after yours.  Also, if you miss three or more appointments without notifying the office, you may be dismissed from the clinic at the provider's discretion.    Again, thank you for choosing [facility].  Our hope is that these requests will decrease the amount of time that you wait before being seen by our physicians.   If you have a lab appointment with the Cancer Center - please note that after April 8th, all labs will be drawn in  the cancer center.  You do not have to check in or register with the main entrance as you have in the past but will complete your check-in at the cancer center.            _____________________________________________________________  Should you have questions after your visit to [facility], please contact our office at [phone] and follow the prompts.  Our office hours are 8:00 a.m. to 4:30 p.m. Monday - Thursday and 8:00 a.m. to 2:30 p.m. Friday.  Please note that voicemails left after 4:00 p.m.  may not be returned until the following business day.  We are closed weekends and all major holidays.  You do have access to a nurse 24-7, just call the main number to the clinic [phone] and do not press any options, hold on the line and a nurse will answer the phone.    For prescription refill requests, have your pharmacy contact our office and allow 72 hours.    Masks are no longer required in the cancer centers. If you would like for your care team to wear a mask while they are taking care of you, please let them know. You may have one support person who is at least 80 years old accompany you for your appointments.

## 2023-12-27 NOTE — Progress Notes (Signed)
 [facility] [phone]   [phone]  INITIAL CONSULTATION:  Patient Care Team: Bertell Satterfield, MD as PCP - General (Internal Medicine) Court Dorn PARAS, MD as PCP - Cardiology (Cardiology) Delores Dena RAMAN, RN as Oncology Nurse Navigator  CHIEF COMPLAINTS/PURPOSE OF CONSULTATION:  Lung mass  HISTORY OF PRESENTING ILLNESS:  Peter Lambert 78 y.o. male who recently presented to the emergency room for hip pain and found to have a lytic lesion in the cerebellum.  CT chest/abdomen/pelvis demonstrated lung mass concerning for bronchogenic carcinoma.  Patient has past medical history significant for CKD stage IIIb, hyperkalemia, hypertension, anemia of chronic disease (recently referred to hematology for possible EPO )and secondary hyperparathyroidism.  Has history of acute myocardial infarction.  He is a former smoker and smoked half a pack of cigarettes for greater than 52 years.  Most recent labs show unremarkable CMP, mild anemia hemoglobin 10.2 with a normal white blood cell count.  Mild elevation to absolute monocyte count.  D-dimer elevated 1.48.  On exam today patient is still in quite a bit of pain in his left hip.  He has shortness of breath especially with exertion.  Reports he is able to sit still and lay down without shortness of breath but otherwise he feels like it is hard to breathe.  Reports no appetite and very low energy.  Reports he sleeps a lot.  He is able to bear weight on his left leg but it hurts and he has been using a cane.  He has a cough occasionally.  Has nausea when he tries to eat.  Patient lives alone.  MEDICAL HISTORY:  Past Medical History:  Diagnosis Date   Acute upper GI bleed    Coronary artery disease    a. s/p NSTEMI in 2010 with DES to proximal LAD with D1 jailed and angioplasty alone to ostium   Hyperlipidemia    Hypertension    Hypertension    Peripheral arterial disease    Tobacco  abuse    Type 2 diabetes mellitus (HCC)     SURGICAL HISTORY: Past Surgical History:  Procedure Laterality Date   CARDIAC SURGERY     COLONOSCOPY WITH PROPOFOL  N/A 01/08/2020   Procedure: COLONOSCOPY WITH PROPOFOL ;  Surgeon: Cindie Carlin POUR, DO;  Location: [facility];  Service: Endoscopy;  Laterality: N/A;   COLONOSCOPY WITH PROPOFOL  N/A 05/03/2020   Hung:entire examined colon normal   CORONARY STENT INTERVENTION N/A 09/03/2019   Procedure: CORONARY STENT INTERVENTION;  Surgeon: Verlin Lonni BIRCH, MD;  Location: [facility] IN[facility];  Service: Cardiovascular;  Laterality: N/A;   CORONARY STENT PLACEMENT     ENTEROSCOPY N/A 05/03/2020   hung:normal esophagus, normal stomach. Three non-bleeding angiodysplastic lesions in the duodenum. Treated with a monopolar probe. A few non-bleeding angiodysplastic lesions in the jejunum. Treated with a monopolar probe. No specimens collected.   ESOPHAGOGASTRODUODENOSCOPY (EGD) WITH PROPOFOL  N/A 01/06/2020   Procedure: ESOPHAGOGASTRODUODENOSCOPY (EGD) WITH PROPOFOL ;  Surgeon: Golda Claudis PENNER, MD;  Location: [facility];  Service: Endoscopy;  Laterality: N/A;   HOT HEMOSTASIS N/A 05/03/2020   Procedure: HOT HEMOSTASIS (ARGON PLASMA COAGULATION/BICAP);  Surgeon: Rollin Dover, MD;  Location: [facility] ENDOSCOPY;  Service: Gastroenterology;  Laterality: N/A;   ICD IMPLANT N/A 09/04/2019   Procedure: ICD IMPLANT;  Surgeon: Waddell Danelle ORN, MD;  Location: [facility]VAS[facility] CV LAB;  Service: Cardiovascular;  Laterality: N/A;   RIGHT/LEFT HEART CATH AND CORONARY ANGIOGRAPHY N/A 09/03/2019   Procedure: RIGHT/LEFT HEART CATH  AND CORONARY ANGIOGRAPHY;  Surgeon: Cherrie Toribio SAUNDERS, MD;  Location: [facility];  Service: Cardiovascular;  Laterality: N/A;    SOCIAL HISTORY: Social History   Socioeconomic History   Marital status: Divorced    Spouse name: Not on file   Number of children: Not on file   Years of education: Not on file   Highest education  level: Not on file  Occupational History   Not on file  Tobacco Use   Smoking status: Former    Current packs/day: 0.50    Average packs/day: 0.5 packs/day for 52.0 years (26.0 ttl pk-yrs)    Types: Cigarettes   Smokeless tobacco: Never  Vaping Use   Vaping status: Never Used  Substance and Sexual Activity   Alcohol use: No   Drug use: No   Sexual activity: Never  Other Topics Concern   Not on file  Social History Narrative   Not on file   Social Drivers of Health   Financial Resource Strain: Not on file  Food Insecurity: Not on file  Transportation Needs: Not on file  Physical Activity: Not on file  Stress: Not on file  Social Connections: Not on file  Intimate Partner Violence: Not on file    FAMILY HISTORY: Family History  Problem Relation Age of Onset   Colon cancer Mother        thinks it was colon cancer   Cancer Mother        not sure location    Prostate cancer Father    Dementia Sister    Colon cancer Brother    Stomach cancer Neg Hx     ALLERGIES:  has no known allergies.  MEDICATIONS:  Current Outpatient Medications  Medication Sig Dispense Refill   apixaban  (ELIQUIS ) 5 MG TABS tablet Take 1 tablet (5 mg total) by mouth 2 (two) times daily. 60 tablet 5   atorvastatin  (LIPITOR ) 80 MG tablet Take 80 mg by mouth daily.     carvedilol  (COREG ) 3.125 MG tablet Take 1 tablet (3.125 mg total) by mouth daily with breakfast. 30 tablet 0   [employer] GENTLE LAXATIVE 5 MG EC tablet TAKE 1 TABLET BY MOUTH DAILY AS NEEDED FOR MODERATE CONSTIPATION. TAKE TWO DAYS BEFORE PROCEDURE 30 tablet 0   cyanocobalamin  (,VITAMIN B-12,) 1000 MCG/ML injection Inject 1,000 mcg into the muscle every 30 (thirty) days.     cyanocobalamin  1000 MCG tablet Take 1,000 mcg by mouth daily.     fentaNYL  (DURAGESIC ) 25 MCG/HR Place 1 patch onto the skin every 3 (three) days for 15 days. 5 patch 0   ferrous sulfate  (FERROUSUL) 325 (65 FE) MG tablet Take 1 tablet (325 mg total) by mouth daily with  breakfast.  0   furosemide  (LASIX ) 20 MG tablet Take 20 mg by mouth daily.     guaiFENesin  (MUCINEX ) 600 MG 12 hr tablet Take 600 mg by mouth 2 (two) times daily as needed for cough or to loosen phlegm.     losartan  (COZAAR ) 25 MG tablet Take 25 mg by mouth daily.     nitroGLYCERIN  (NITROSTAT ) 0.4 MG SL tablet Place 1 tablet (0.4 mg total) under the tongue every 5 (five) minutes as needed for chest pain. Patient must keep upcoming appointment for further refills 25 tablet 12   omeprazole  (PRILOSEC) 20 MG capsule Take 20 mg by mouth daily.     ondansetron  (ZOFRAN ) 8 MG tablet Take 1 tablet (8 mg total) by mouth every 8 (eight) hours as needed for  nausea or vomiting. 20 tablet 0   oxyCODONE-acetaminophen  (PERCOCET/ROXICET) 5-325 MG tablet Take 1 tablet by mouth every 6 (six) hours as needed for severe pain (pain score 7-10). 6 tablet 0   potassium chloride  SA (KLOR-CON ) 20 MEQ tablet Take 20 mEq by mouth daily.     spironolactone  (ALDACTONE ) 50 MG tablet Take 50 mg by mouth daily.     No current facility-administered medications for this visit.    REVIEW OF SYSTEMS:   Review of Systems  Constitutional:  Positive for malaise/fatigue and weight loss.  Respiratory:  Positive for cough and sputum production.   Gastrointestinal:  Positive for nausea and vomiting.  Musculoskeletal:  Positive for joint pain. Negative for falls.  Neurological:  Positive for dizziness.     PHYSICAL EXAMINATION: ECOG PERFORMANCE STATUS: 2 - Symptomatic, <50% confined to bed  Vitals:   12/27/23 0833  BP: (!) 145/70  Pulse: (!) 59  Resp: 18  Temp: (!) 97.5 F (36.4 C)   Filed Weights   12/27/23 0833  Weight: 143 lb 4.8 oz (65 kg)    Physical Exam Constitutional:      Appearance: Normal appearance.  Cardiovascular:     Rate and Rhythm: Tachycardia present.  Pulmonary:     Effort: Pulmonary effort is normal.     Breath sounds: Normal breath sounds.  Musculoskeletal:        General: Tenderness (Left  hip) present.  Neurological:     Mental Status: He is alert and oriented to person, place, and time.      LABORATORY DATA:  I have reviewed the data as listed    Latest Ref Rng & Units 12/22/2023    5:39 PM 11/26/2021   10:48 PM 08/16/2021   10:07 AM  CBC  WBC 4.0 - 10.5 K/uL 7.5  9.3  6.7   Hemoglobin 13.0 - 17.0 g/dL 89.7  89.9  89.0   Hematocrit 39.0 - 52.0 % 33.0  31.5  33.0   Platelets 150 - 400 K/uL 259  238  193        Latest Ref Rng & Units 12/22/2023    5:39 PM 11/26/2021   10:48 PM 05/03/2021   11:37 AM  CMP  Glucose 70 - 99 mg/dL 96  862    BUN 8 - 23 mg/dL 17  34    Creatinine 9.38 - 1.24 mg/dL 8.78  7.75    Sodium 864 - 145 mmol/L 140  139    Potassium 3.5 - 5.1 mmol/L 4.0  4.8    Chloride 98 - 111 mmol/L 102  103    CO2 22 - 32 mmol/L 28  24    Calcium  8.9 - 10.3 mg/dL 9.7  9.4    Total Protein 6.5 - 8.1 g/dL  8.2  7.1   Total Bilirubin 0.3 - 1.2 mg/dL  0.6  <9.7   Alkaline Phos 38 - 126 U/L  93  103   AST 15 - 41 U/L  27  31   ALT 0 - 44 U/L  27  43      RADIOGRAPHIC STUDIES: I have personally reviewed the radiological images as listed and agreed with the findings in the report. CT CHEST ABDOMEN PELVIS W CONTRAST Result Date: 12/22/2023 EXAM: CT CHEST, ABDOMEN AND PELVIS WITH CONTRAST 12/22/2023 07:48:41 PM TECHNIQUE: CT of the chest, abdomen and pelvis was performed with the administration of 100 mL of iohexol  (OMNIPAQUE ) 300 MG/ML solution. Multiplanar reformatted images are provided for review. Automated exposure  control, iterative reconstruction, and/or weight based adjustment of the mA/kV was utilized to reduce the radiation dose to as low as reasonably achievable. COMPARISON: CT pelvis earlier today CLINICAL HISTORY: Metastatic disease evaluation. Metastatic disease eval, abnormal CT, c/o left hip pain that started 2-3 weeks ago. Pt reports pain radiates into his leg and worse with movement. Pt denies falling or injury. FINDINGS: CHEST: MEDIASTINUM AND  LYMPH NODES: Heart and pericardium are unremarkable. Left subclavian icd Moderate coronary atherosclerosis of the lad Thoracic aortic atherosclerosis The central airways are clear. 13 mm short axis right supraclavicular node (image 3). Additional thoracic lymphadenopathy, including a 2.3 cm right hilar node (image 25), and a 1.7 cm short axis subcarinal node (image 29). LUNGS AND PLEURA: 3.0 x 4.0 cm posterior right upper lobe/perihilar mass (image 45) compatible with primary bronchogenic carcinoma. No pleural effusion or pneumothorax. ABDOMEN AND PELVIS: LIVER: The liver is unremarkable. GALLBLADDER AND BILE DUCTS: Gallbladder is unremarkable. No biliary ductal dilatation. SPLEEN: No acute abnormality. PANCREAS: No acute abnormality. ADRENAL GLANDS: No acute abnormality. KIDNEYS, URETERS AND BLADDER: No stones in the kidneys or ureters. No hydronephrosis. No perinephric or periureteral stranding. Urinary bladder is unremarkable. GI AND BOWEL: Stomach demonstrates no acute abnormality. There is no bowel obstruction. REPRODUCTIVE ORGANS: Mild prostatomegaly. PERITONEUM AND RETROPERITONEUM: 2.5 cm soft tissue lesion beneath the left upper anterior abdominal wall (image 58) suggesting peritoneal metastasis in this clinical context. No ascites. No free air. VASCULATURE: Atherosclerotic calcification of the abdominal aorta. Aortobifemoral bypass graft, patent. ABDOMINAL AND PELVIS LYMPH NODES: No lymphadenopathy. BONES AND SOFT TISSUES: Lytic calcification metastases involving the anterior 2nd and posterior 3rd ribs (images 11 and 13) measuring up to 4.2 cm. Additional lytic soft tissue metastasis in the right posterior 8th rib. Lytic soft tissue metastasis involving the T9 and T11 vertebral bodies (sagittal image 102) with compression of the spinal canal at T11. Lytic soft tissue metastasis involving the left anterior acetabulum (image 113). Dominant 5.9 x 7.4 cm lytic soft tissue metastasis involving the left  hemipelvis, extending from the posterior acetabulum to the inferior pubic ramus (image 123). Additional scattered smaller lytic metastases in the visualized axial and appendicular skeleton. A 2.5 cm soft tissue lesion is noted beneath the left upper anterior abdominal wall (image 58). IMPRESSION: 1. 4.0 cm posterior right upper lobe/perihilar mass, compatible with primary bronchogenic carcinoma. 2. Thoracic nodal metastases, as above. A 13 mm short axis right supraclavicular node would be amenable to percutaneous sampling, as clinically warranted. 3. Suspected isolated peritoneal metastasis in the left upper abdomen. 4. Extensive osseous metastatic disease, as described above, including a T11 lesion narrowing the spinal canal. Consider MR with contrast for further evaluation, as clinically warranted. Electronically signed by: Pinkie Pebbles MD 12/22/2023 08:06 PM EDT RP Workstation: [id number]   CT PELVIS WO CONTRAST Result Date: 12/22/2023 CLINICAL DATA:  Possible lucent lesions on x-ray. Left hip and left thigh pain. No known injury. EXAM: CT PELVIS WITHOUT CONTRAST TECHNIQUE: Multidetector CT imaging of the pelvis was performed following the standard protocol without intravenous contrast. RADIATION DOSE REDUCTION: This exam was performed according to the departmental dose-optimization program which includes automated exposure control, adjustment of the mA and/or kV according to patient size and/or use of iterative reconstruction technique. COMPARISON:  12/22/2023. FINDINGS: Urinary Tract:  No abnormality visualized. Bowel: Unremarkable visualized pelvic bowel loops. Appendix appears normal. Vascular/Lymphatic: Findings suggestive of aortobifemoral bypass grafts. No pelvic lymphadenopathy by size criteria. Reproductive:  The prostate gland is enlarged. Other:  No ascites. Musculoskeletal: No  acute fracture or dislocation is seen. Multiple lytic lesions are seen scattered throughout the bones. There are large  destructive lesions with soft tissue masses is involving the sacrum on the right, acetabulum on the left, and inferior pubic ramus on the left. Evaluation of the masses is limited due to lack of IV contrast. However the largest soft tissue mass is seen in the region the inferior pubic ramus on the left measuring 5.8 x 5.5 cm. IMPRESSION: Multiple lytic lesions scattered throughout the bones with large destructive lesionsand associated soft tissue masses involving the sacrum on the right, acetabulum on the left, and inferior pubic ramus on the left. No pathologic fracture is seen. Differential diagnosis includes multiple myeloma/plasmacytoma versus metastatic disease. Electronically Signed   By: Leita Birmingham M.D.   On: 12/22/2023 18:31   DG Femur Min 2 Views Left Result Date: 12/22/2023 EXAM: 2 VIEW(S) XRAY OF THE LEFT FEMUR 12/22/2023 05:53:00 PM COMPARISON: None available. CLINICAL HISTORY: left hip/femur pain. Per triage ;  Pt arrives with c/o left hip pain that started 2-3 weeks ago. Pt reports pain radiates into his leg and worse with movement. Pt denies falling or injury. ; Best lateral obtainable, pt had trouble staying still once in position FINDINGS: BONES AND JOINTS: Left inferior pubic ramus appears fractured and fragmented with some areas of focal lucency. The left femur appears intact. There are mild degenerative changes of the hip and knee. No joint dislocation. SOFT TISSUES: Peripheral vascular calcifications are present. IMPRESSION: 1. Fracture and fragmentation of the left inferior pubic ramus with focal lucency. 2. Mild degenerative changes of the hip and knee. Electronically signed by: Greig Pique MD 12/22/2023 06:10 PM EDT RP Workstation: [id number]   DG Pelvis 1-2 Views Result Date: 12/22/2023 EXAM: 1 or 2 VIEW(S) XRAY OF THE PELVIS 12/22/2023 04:57:18 PM COMPARISON: None available. CLINICAL HISTORY: Left hip pain. FINDINGS: BONES AND JOINTS: Questionable erosive changes and lucent  lesions in the left inferior pubic ramus. No joint dislocation. SOFT TISSUES: Surgical clips overlie the bilateral inguinal regions. Peripheral vascular calcifications are present. STOMACH AND BOWEL: Diffuse gaseous distention of visualized colon. IMPRESSION: 1. Questionable erosive changes and lucent lesions in the left inferior pubic ramus. Recommend further evaluation with CT. 2. Diffuse gaseous distention of visualized colon. Electronically signed by: Greig Pique MD 12/22/2023 05:19 PM EDT RP Workstation: [id number]   CUP PACEART REMOTE DEVICE CHECK Result Date: 12/04/2023 ICD scheduled remote reviewed. Normal device function.  Presenting rhythm: AS-VS Decrease in RA pace impedance trends since July, remains WNL. Several ATR events. Longest x 47 sec. On amio and OAC per chart. 1 NSVT event x 10 beats @ 194bpm. V>A. Next remote transmission per protocol. AB, CVRSMRI Protection last programmed on Feb 24, 2020. Beeper is OFF.   ASSESSMENT & PLAN Assessment & Plan Lung mass  #Lung mass: --Differentials include bronchogenic primary, metastatic lung lesion and benign lesion --Referral to pulmonology for evaluation of EBUS with biopsy --Recommend PET/CT scan for further evaluation and staging --Labs today to check CBC and CMP.  --RTC once workup is complete.  Shortness of breath Likely secondary to mass. Walked patient in clinic and oxygen  dropped below 85% with ambulation without O2. Will get home oxygen  ordered for patient. Recommend starting on 2 L and checking pulse ox at home to maintain of oxygen  level greater than 95%. Pain of left hip Likely due to metastatic lesion. Patient was prescribed Percocet from ED which he states makes him feel tired and does not really help. We  discussed starting a long-acting pain medicine such as fentanyl  25 mcg transdermally every 72 hours.  Can increase as needed based on tolerance.  We discussed he also can take his fast acting pain medicine as prescribed  by ED.    Orders Placed This Encounter  Procedures   NM PET Image Initial (PI) Skull Base To Thigh    Standing Status:   Future    Expected Date:   01/03/2024    Expiration Date:   12/26/2024    If indicated for the ordered procedure, I authorize the administration of a radiopharmaceutical per Radiology protocol:   Yes    Preferred imaging location?:   [facility]    Release to patient:   Immediate   CBC with Differential    Standing Status:   Future    Expected Date:   12/27/2023    Expiration Date:   03/26/2024   Comprehensive metabolic panel    Standing Status:   Future    Expected Date:   12/27/2023    Expiration Date:   03/26/2024   Ambulatory referral to Pulmonology    Referral Priority:   Routine    Referral Type:   Consultation    Referral Reason:   Specialty Services Required    Referred to Provider:   Isadora Hose, MD    Requested Specialty:   Pulmonary Disease    Number of Visits Requested:   1    All questions were answered. The patient knows to call the clinic with any problems, questions or concerns.  I have spent a total of 40 minutes minutes of face-to-face and non-face-to-face time, preparing to see the patient, obtaining and/or reviewing separately obtained history, performing a medically appropriate examination, counseling and educating the patient, ordering medications/tests/procedures, referring and communicating with other health care professionals, documenting clinical information in the electronic health record, independently interpreting results and communicating results to the patient, and care coordination.   Delon Hope, AGNP-C Department of Hematology/Oncology [facility] at [facility]  Phone: [phone]

## 2023-12-30 ENCOUNTER — Telehealth: Payer: Self-pay

## 2023-12-30 DIAGNOSIS — R0602 Shortness of breath: Secondary | ICD-10-CM | POA: Diagnosis not present

## 2023-12-30 DIAGNOSIS — R918 Other nonspecific abnormal finding of lung field: Secondary | ICD-10-CM | POA: Diagnosis not present

## 2023-12-30 NOTE — Telephone Encounter (Signed)
 BSX, ICD alert: Alert remote transmission:  -   Atrial Arrhythmia Burden of at least 24.0 hours in a 24 hour period. AF in progress from 10/30 @ 17:21, not always controlled rates, burden 2%, Eliquis  per EPIC,   HL=23, elevation in AF burden, respiratory rate and drops in thoracic impedance. Increased night and mean heart rates with decreased heart rate variability.   *Also of note: there has been a sharp decline in RA lead impedance since August 2025.   Patient under evaluation for lung mass and possible metastatic cancer.  He has appointment with Dr. Waddell on 01/14/24 in [place].

## 2023-12-30 NOTE — Telephone Encounter (Signed)
 Patient states that he has increasing SOB on exertion.  Has lung mass, started on at home O2 last week and has pulmonology follow up this week for biopsy of lung mass. Also, following oncology who prescribed Fentanyl  pain patch in addition to oxycodone prescribed by ER last week.    Patient states he was told to not take his Eliquis  while he was on his pain medication.  I do not see in the notes where he is to hold his Eliquis .   *Acute recent increase in HF logistics and AF with poor rate control at times with declining general health.  Sees Dr. Waddell on 01/14/24, with other acute appointments in interim with Pulmonology/Oncology. Appears he has metastatic cancer.   Forwarding to Dr. Waddell - any changes we should make in interim until he sees you on 11/18? Should he be back on his Eliquis ?

## 2024-01-01 ENCOUNTER — Emergency Department (HOSPITAL_COMMUNITY)

## 2024-01-01 ENCOUNTER — Inpatient Hospital Stay (HOSPITAL_COMMUNITY)
Admission: EM | Admit: 2024-01-01 | Discharge: 2024-01-03 | DRG: 303 | Disposition: A | Discharge status: Home / Self Care | Attending: Internal Medicine | Admitting: Internal Medicine

## 2024-01-01 ENCOUNTER — Encounter: Payer: Self-pay | Admitting: Cardiology

## 2024-01-01 ENCOUNTER — Telehealth: Payer: Self-pay | Admitting: Cardiology

## 2024-01-01 ENCOUNTER — Other Ambulatory Visit: Payer: Self-pay

## 2024-01-01 ENCOUNTER — Encounter: Payer: Self-pay | Admitting: Physician Assistant

## 2024-01-01 ENCOUNTER — Ambulatory Visit: Admitting: Student in an Organized Health Care Education/Training Program

## 2024-01-01 ENCOUNTER — Telehealth: Payer: Self-pay

## 2024-01-01 ENCOUNTER — Encounter: Payer: Self-pay | Admitting: Student in an Organized Health Care Education/Training Program

## 2024-01-01 ENCOUNTER — Encounter (HOSPITAL_COMMUNITY): Payer: Self-pay

## 2024-01-01 ENCOUNTER — Ambulatory Visit: Attending: Cardiology | Admitting: Cardiology

## 2024-01-01 VITALS — BP 140/80 | HR 66 | Temp 97.8°F | Ht 68.0 in | Wt 145.0 lb

## 2024-01-01 VITALS — BP 120/58 | HR 123 | Ht 68.0 in | Wt 146.4 lb

## 2024-01-01 DIAGNOSIS — Z800 Family history of malignant neoplasm of digestive organs: Secondary | ICD-10-CM

## 2024-01-01 DIAGNOSIS — I4819 Other persistent atrial fibrillation: Secondary | ICD-10-CM

## 2024-01-01 DIAGNOSIS — R0789 Other chest pain: Secondary | ICD-10-CM | POA: Diagnosis present

## 2024-01-01 DIAGNOSIS — Z01810 Encounter for preprocedural cardiovascular examination: Secondary | ICD-10-CM | POA: Diagnosis not present

## 2024-01-01 DIAGNOSIS — R918 Other nonspecific abnormal finding of lung field: Secondary | ICD-10-CM | POA: Diagnosis present

## 2024-01-01 DIAGNOSIS — I25119 Atherosclerotic heart disease of native coronary artery with unspecified angina pectoris: Secondary | ICD-10-CM

## 2024-01-01 DIAGNOSIS — E1122 Type 2 diabetes mellitus with diabetic chronic kidney disease: Secondary | ICD-10-CM | POA: Diagnosis present

## 2024-01-01 DIAGNOSIS — N1832 Chronic kidney disease, stage 3b: Secondary | ICD-10-CM | POA: Diagnosis present

## 2024-01-01 DIAGNOSIS — I5022 Chronic systolic (congestive) heart failure: Secondary | ICD-10-CM | POA: Diagnosis present

## 2024-01-01 DIAGNOSIS — Z87891 Personal history of nicotine dependence: Secondary | ICD-10-CM

## 2024-01-01 DIAGNOSIS — Z7901 Long term (current) use of anticoagulants: Secondary | ICD-10-CM

## 2024-01-01 DIAGNOSIS — I2510 Atherosclerotic heart disease of native coronary artery without angina pectoris: Secondary | ICD-10-CM | POA: Diagnosis not present

## 2024-01-01 DIAGNOSIS — Z79899 Other long term (current) drug therapy: Secondary | ICD-10-CM

## 2024-01-01 DIAGNOSIS — I130 Hypertensive heart and chronic kidney disease with heart failure and stage 1 through stage 4 chronic kidney disease, or unspecified chronic kidney disease: Secondary | ICD-10-CM | POA: Diagnosis present

## 2024-01-01 DIAGNOSIS — I10 Essential (primary) hypertension: Secondary | ICD-10-CM | POA: Diagnosis not present

## 2024-01-01 DIAGNOSIS — I2081 Angina pectoris with coronary microvascular dysfunction: Secondary | ICD-10-CM | POA: Diagnosis not present

## 2024-01-01 DIAGNOSIS — C3490 Malignant neoplasm of unspecified part of unspecified bronchus or lung: Secondary | ICD-10-CM | POA: Diagnosis present

## 2024-01-01 DIAGNOSIS — C7951 Secondary malignant neoplasm of bone: Secondary | ICD-10-CM | POA: Diagnosis present

## 2024-01-01 DIAGNOSIS — D631 Anemia in chronic kidney disease: Secondary | ICD-10-CM | POA: Diagnosis present

## 2024-01-01 DIAGNOSIS — E785 Hyperlipidemia, unspecified: Secondary | ICD-10-CM | POA: Diagnosis present

## 2024-01-01 DIAGNOSIS — I252 Old myocardial infarction: Secondary | ICD-10-CM

## 2024-01-01 DIAGNOSIS — Z7982 Long term (current) use of aspirin: Secondary | ICD-10-CM

## 2024-01-01 DIAGNOSIS — N183 Chronic kidney disease, stage 3 unspecified: Secondary | ICD-10-CM | POA: Diagnosis present

## 2024-01-01 DIAGNOSIS — R079 Chest pain, unspecified: Secondary | ICD-10-CM | POA: Diagnosis not present

## 2024-01-01 DIAGNOSIS — K56609 Unspecified intestinal obstruction, unspecified as to partial versus complete obstruction: Secondary | ICD-10-CM | POA: Diagnosis not present

## 2024-01-01 DIAGNOSIS — Z716 Tobacco abuse counseling: Secondary | ICD-10-CM

## 2024-01-01 DIAGNOSIS — E1151 Type 2 diabetes mellitus with diabetic peripheral angiopathy without gangrene: Secondary | ICD-10-CM | POA: Diagnosis present

## 2024-01-01 DIAGNOSIS — Z8042 Family history of malignant neoplasm of prostate: Secondary | ICD-10-CM

## 2024-01-01 DIAGNOSIS — R590 Localized enlarged lymph nodes: Secondary | ICD-10-CM | POA: Diagnosis not present

## 2024-01-01 DIAGNOSIS — I5020 Unspecified systolic (congestive) heart failure: Secondary | ICD-10-CM

## 2024-01-01 DIAGNOSIS — Z95810 Presence of automatic (implantable) cardiac defibrillator: Secondary | ICD-10-CM

## 2024-01-01 DIAGNOSIS — I493 Ventricular premature depolarization: Secondary | ICD-10-CM | POA: Diagnosis present

## 2024-01-01 DIAGNOSIS — Z950 Presence of cardiac pacemaker: Secondary | ICD-10-CM | POA: Diagnosis not present

## 2024-01-01 DIAGNOSIS — Z8674 Personal history of sudden cardiac arrest: Secondary | ICD-10-CM

## 2024-01-01 DIAGNOSIS — K567 Ileus, unspecified: Secondary | ICD-10-CM | POA: Diagnosis present

## 2024-01-01 DIAGNOSIS — R053 Chronic cough: Secondary | ICD-10-CM | POA: Diagnosis present

## 2024-01-01 DIAGNOSIS — I482 Chronic atrial fibrillation, unspecified: Secondary | ICD-10-CM | POA: Diagnosis not present

## 2024-01-01 DIAGNOSIS — N400 Enlarged prostate without lower urinary tract symptoms: Secondary | ICD-10-CM | POA: Diagnosis not present

## 2024-01-01 DIAGNOSIS — Z955 Presence of coronary angioplasty implant and graft: Secondary | ICD-10-CM

## 2024-01-01 LAB — BASIC METABOLIC PANEL WITH GFR
Anion gap: 13 (ref 5–15)
BUN: 17 mg/dL (ref 8–23)
CO2: 24 mmol/L (ref 22–32)
Calcium: 11 mg/dL — ABNORMAL HIGH (ref 8.9–10.3)
Chloride: 100 mmol/L (ref 98–111)
Creatinine, Ser: 1.44 mg/dL — ABNORMAL HIGH (ref 0.61–1.24)
GFR, Estimated: 49 mL/min — ABNORMAL LOW (ref >60)
Glucose, Bld: 117 mg/dL — ABNORMAL HIGH (ref 70–99)
Potassium: 4.1 mmol/L (ref 3.5–5.1)
Sodium: 137 mmol/L (ref 135–145)

## 2024-01-01 LAB — CUP PACEART INCLINIC DEVICE CHECK
Date Time Interrogation Session: 20251105155552
HighPow Impedance: 45 Ohm
HighPow Impedance: 76 Ohm
Implantable Lead Connection Status: 753985
Implantable Lead Connection Status: 753985
Implantable Lead Implant Date: 20210709
Implantable Lead Implant Date: 20210709
Implantable Lead Location: 753859
Implantable Lead Location: 753860
Implantable Lead Model: 137
Implantable Lead Model: 7841
Implantable Lead Serial Number: 1063971
Implantable Lead Serial Number: 300935
Implantable Pulse Generator Implant Date: 20210709
Lead Channel Impedance Value: 465 Ohm
Lead Channel Impedance Value: 483 Ohm
Lead Channel Pacing Threshold Amplitude: 0.9 V
Lead Channel Pacing Threshold Pulse Width: 0.4 ms
Lead Channel Sensing Intrinsic Amplitude: 1.8 mV
Lead Channel Sensing Intrinsic Amplitude: 12.3 mV
Lead Channel Setting Pacing Amplitude: 2 V
Lead Channel Setting Pacing Amplitude: 2.2 V
Lead Channel Setting Pacing Pulse Width: 0.4 ms
Lead Channel Setting Sensing Sensitivity: 0.5 mV
Pulse Gen Serial Number: 224993
Zone Setting Status: 755011

## 2024-01-01 LAB — TROPONIN I (HIGH SENSITIVITY)
Troponin I (High Sensitivity): 13 ng/L (ref <18)
Troponin I (High Sensitivity): 14 ng/L (ref <18)

## 2024-01-01 LAB — CBC
HCT: 33.2 % — ABNORMAL LOW (ref 39.0–52.0)
Hemoglobin: 10 g/dL — ABNORMAL LOW (ref 13.0–17.0)
MCH: 26 pg (ref 26.0–34.0)
MCHC: 30.1 g/dL (ref 30.0–36.0)
MCV: 86.2 fL (ref 80.0–100.0)
Platelets: 380 K/uL (ref 150–400)
RBC: 3.85 MIL/uL — ABNORMAL LOW (ref 4.22–5.81)
RDW: 14.6 % (ref 11.5–15.5)
WBC: 10.1 K/uL (ref 4.0–10.5)
nRBC: 0 % (ref 0.0–0.2)

## 2024-01-01 LAB — BRAIN NATRIURETIC PEPTIDE: B Natriuretic Peptide: 224.1 pg/mL — ABNORMAL HIGH (ref 0.0–100.0)

## 2024-01-01 MED ORDER — IOHEXOL 350 MG/ML SOLN
50.0000 mL | Freq: Once | INTRAVENOUS | Status: AC | PRN
  Administered 2024-01-01: 50 mL via INTRAVENOUS

## 2024-01-01 NOTE — Progress Notes (Signed)
 Assessment & Plan:   #Lung mass (Primary)  The patient is here to discuss their imaging abnormalities which include a RUL/perihilar pulmonary mass with metastatic malignancy very high on the differential diagnosis. Discussed need for tissue biopsy, and will schedule bronchoscopy with EBUS for biopsy. Ensure he is off Eliquis  for 48 hours before the procedure. Will ask for cardiology clearance for the procedure given underlying history of CAD, Afib, and prior Vfib arrest. Coordinate with the oncology team for follow-up after biopsy results. Will attempt to expedite diagnosis and I will schedule patient with my colleague Dr. Malka for bronchoscopy and biopsy as early as next week, contingent on cardiology evaluation.  We discussed the importance of diagnosis and staging in lung malignancies, and the approach to obtaining a tissue diagnosis which would include robotic assisted navigational bronchoscopy with endobronchial ultrasound guided sampling.  We also discussed the risks associated with the procedure which include a 2% risk of pneumothorax, infection, bleeding, and nondiagnostic procedure in detail.  I explained that patients typically are able to return home the same day of the procedure, but in rare cases admission to the hospital for observation and treatment is required.  After our discussion, the patient elected to proceed with the procedure  Recommendations:  - Procedural/ Surgical Case Request: ENDOBRONCHIAL ULTRASOUND (EBUS); Future - Follow up PET/CT - Cardiology evaluation for preoperative risk assessment and optimization   [name], MD [facility]  I spent 60 minutes caring for this patient today, including preparing to see the patient, obtaining a medical history , reviewing a separately obtained history, performing a medically appropriate examination and/or evaluation, counseling and educating the patient/family/caregiver, ordering medications,  tests, or procedures, documenting clinical information in the electronic health record, and independently interpreting results (not separately reported/billed) and communicating results to the patient/family/caregiver  End of visit medications:  No orders of the defined types were placed in this encounter.    Current Outpatient Medications:    aspirin  EC 81 MG tablet, Take 81 mg by mouth daily. Swallow whole., Disp: , Rfl:    atorvastatin  (LIPITOR ) 80 MG tablet, Take 80 mg by mouth daily., Disp: , Rfl:    carvedilol  (COREG ) 3.125 MG tablet, Take 1 tablet (3.125 mg total) by mouth daily with breakfast., Disp: 30 tablet, Rfl: 0   [employer] GENTLE LAXATIVE 5 MG EC tablet, TAKE 1 TABLET BY MOUTH DAILY AS NEEDED FOR MODERATE CONSTIPATION. TAKE TWO DAYS BEFORE PROCEDURE (Patient taking differently: daily as needed.), Disp: 30 tablet, Rfl: 0   cyanocobalamin  (,VITAMIN B-12,) 1000 MCG/ML injection, Inject 1,000 mcg into the muscle every 30 (thirty) days., Disp: , Rfl:    cyanocobalamin  1000 MCG tablet, Take 1,000 mcg by mouth daily., Disp: , Rfl:    fentaNYL  (DURAGESIC ) 25 MCG/HR, Place 1 patch onto the skin every 3 (three) days for 15 days., Disp: 5 patch, Rfl: 0   ferrous sulfate  (FERROUSUL) 325 (65 FE) MG tablet, Take 1 tablet (325 mg total) by mouth daily with breakfast., Disp: , Rfl: 0   furosemide  (LASIX ) 20 MG tablet, Take 20 mg by mouth daily., Disp: , Rfl:    guaiFENesin  (MUCINEX ) 600 MG 12 hr tablet, Take 600 mg by mouth 2 (two) times daily as needed for cough or to loosen phlegm., Disp: , Rfl:    losartan  (COZAAR ) 25 MG tablet, Take 25 mg by mouth daily., Disp: , Rfl:    nitroGLYCERIN  (NITROSTAT ) 0.4 MG SL tablet, Place 1 tablet (0.4 mg total) under the tongue  every 5 (five) minutes as needed for chest pain. Patient must keep upcoming appointment for further refills, Disp: 25 tablet, Rfl: 12   omeprazole  (PRILOSEC) 20 MG capsule, Take 20 mg by mouth daily., Disp: , Rfl:    ondansetron  (ZOFRAN ) 8  MG tablet, Take 1 tablet (8 mg total) by mouth every 8 (eight) hours as needed for nausea or vomiting., Disp: 20 tablet, Rfl: 0   potassium chloride  SA (KLOR-CON ) 20 MEQ tablet, Take 20 mEq by mouth daily., Disp: , Rfl:    spironolactone  (ALDACTONE ) 50 MG tablet, Take 50 mg by mouth daily., Disp: , Rfl:    apixaban  (ELIQUIS ) 5 MG TABS tablet, Take 1 tablet (5 mg total) by mouth 2 (two) times daily. (Patient not taking: Reported on 01/01/2024), Disp: 60 tablet, Rfl: 5   Subjective:   PATIENT ID: Peter Lambert GENDER: male DOB: 1945-06-26, MRN: [id number]  Chief Complaint  Patient presents with   Consult    Occasional cough. Shortness of breath on exertion.     HPI  Discussed the use of AI scribe software for clinical note transcription with the patient, who gave verbal consent to proceed.  Peter Lambert is an 78 year old male with a history of atrial fibrillation and ICD who presents with findings of lung mass concerning for metastatic lung cancer.  He started experiencing a cough around the end of September or early October. The cough was initially persistent but has since decreased in severity. It is productive of clear phlegm and is not associated with hemoptysis. No chest pain or swelling in the arms, neck, or face.  Patient was seen in the ED on 12/22/2023 with hip pain, and was found to have lytic lesions in the acetabulum. A CT scan of the chest was concerning for lung malignancy, and he is referred to pulmonary for further evaluation. CT scan showed a 4 cm posterior RUL/perihilar mass, as well as signs of thoracic nodal metastasis, and extensive osseous metastatic disease.  He has experienced pain in his leg, hip, and back, which led him to seek emergency care. During this visit, he was informed of the possibility of cancer and was given pain medication. He has been seen by oncology and referred to us  for potential biopsy. PET/CT is scheduled for this Friday.  He notes a weight  loss of approximately 5-6 pounds over the past few months, with his weight decreasing from 150 pounds to 146 pounds. He attributes this to a decreased appetite, stating 'the older you get, the less you eat.' He has not had night sweats or chills.  His past medical history includes NSTEMI in 2010 (LAD s/p DES), PAD (s/p aortobifemoral bypass), cardiac arrest 2021 due to Vfib (s/p ICD placement), for which he has a defibrillator. He also has atrial fibrillation and is on Eliquis . He has not taken Eliquis  for the past three days as per instructions from a nurse, pending further guidance.  He has a significant smoking history, having smoked less than a pack a day from the age of 25 until he quit four years ago at the age of 61. He has around 50 to 60 pack years of smoking history     Ancillary information including prior medications, full medical/surgical/family/social histories, and PFTs (when available) are listed below and have been reviewed.    Review of Systems  Constitutional:  Negative for chills, fever, malaise/fatigue and weight loss.  Respiratory:  Positive for cough. Negative for hemoptysis, sputum production, shortness of breath and  wheezing.   Cardiovascular:  Negative for chest pain.     Objective:   Vitals:   01/01/24 0931  BP: (!) 140/80  Pulse: 66  Temp: 97.8 F (36.6 C)  TempSrc: Temporal  SpO2: 98%  Weight: 145 lb (65.8 kg)  Height: 5' 8 (1.727 m)   98% on RA  BMI Readings from Last 3 Encounters:  01/01/24 22.05 kg/m  12/27/23 21.79 kg/m  12/22/23 25.39 kg/m   Wt Readings from Last 3 Encounters:  01/01/24 145 lb (65.8 kg)  12/27/23 143 lb 4.8 oz (65 kg)  12/22/23 167 lb (75.8 kg)    Physical Exam Constitutional:      Appearance: He is ill-appearing (thin appearing).  Cardiovascular:     Rate and Rhythm: Normal rate and regular rhythm.     Pulses: Normal pulses.     Heart sounds: Normal heart sounds.  Pulmonary:     Effort: Pulmonary effort is  normal.     Breath sounds: Normal breath sounds. No wheezing or rales.  Neurological:     General: No focal deficit present.     Mental Status: He is alert and oriented to person, place, and time. Mental status is at baseline.       Ancillary Information    Past Medical History:  Diagnosis Date   Acute upper GI bleed    Coronary artery disease    a. s/p NSTEMI in 2010 with DES to proximal LAD with D1 jailed and angioplasty alone to ostium   Hyperlipidemia    Hypertension    Hypertension    Peripheral arterial disease    Tobacco abuse    Type 2 diabetes mellitus (HCC)      Family History  Problem Relation Age of Onset   Colon cancer Mother        thinks it was colon cancer   Cancer Mother        not sure location    Prostate cancer Father    Dementia Sister    Colon cancer Brother    Stomach cancer Neg Hx      Past Surgical History:  Procedure Laterality Date   CARDIAC SURGERY     COLONOSCOPY WITH PROPOFOL  N/A 01/08/2020   Procedure: COLONOSCOPY WITH PROPOFOL ;  Surgeon: Cindie Carlin POUR, DO;  Location: [facility];  Service: Endoscopy;  Laterality: N/A;   COLONOSCOPY WITH PROPOFOL  N/A 05/03/2020   Hung:entire examined colon normal   CORONARY STENT INTERVENTION N/A 09/03/2019   Procedure: CORONARY STENT INTERVENTION;  Surgeon: Verlin Lonni BIRCH, MD;  Location: [facility];  Service: Cardiovascular;  Laterality: N/A;   CORONARY STENT PLACEMENT     ENTEROSCOPY N/A 05/03/2020   hung:normal esophagus, normal stomach. Three non-bleeding angiodysplastic lesions in the duodenum. Treated with a monopolar probe. A few non-bleeding angiodysplastic lesions in the jejunum. Treated with a monopolar probe. No specimens collected.   ESOPHAGOGASTRODUODENOSCOPY (EGD) WITH PROPOFOL  N/A 01/06/2020   Procedure: ESOPHAGOGASTRODUODENOSCOPY (EGD) WITH PROPOFOL ;  Surgeon: Golda Claudis PENNER, MD;  Location: [facility];  Service: Endoscopy;  Laterality: N/A;   HOT HEMOSTASIS  N/A 05/03/2020   Procedure: HOT HEMOSTASIS (ARGON PLASMA COAGULATION/BICAP);  Surgeon: Rollin Dover, MD;  Location: [facility] ENDOSCOPY;  Service: Gastroenterology;  Laterality: N/A;   ICD IMPLANT N/A 09/04/2019   Procedure: ICD IMPLANT;  Surgeon: Waddell Danelle ORN, MD;  Location: [facility] INVASIVE CV LAB;  Service: Cardiovascular;  Laterality: N/A;   RIGHT/LEFT HEART CATH AND CORONARY ANGIOGRAPHY N/A 09/03/2019   Procedure: RIGHT/LEFT HEART  CATH AND CORONARY ANGIOGRAPHY;  Surgeon: Cherrie Toribio SAUNDERS, MD;  Location: [facility] CV LAB;  Service: Cardiovascular;  Laterality: N/A;    Social History   Socioeconomic History   Marital status: Divorced    Spouse name: Not on file   Number of children: Not on file   Years of education: Not on file   Highest education level: Not on file  Occupational History   Not on file  Tobacco Use   Smoking status: Former    Current packs/day: 0.50    Average packs/day: 0.5 packs/day for 52.0 years (26.0 ttl pk-yrs)    Types: Cigarettes   Smokeless tobacco: Never  Vaping Use   Vaping status: Never Used  Substance and Sexual Activity   Alcohol use: No   Drug use: No   Sexual activity: Never  Other Topics Concern   Not on file  Social History Narrative   Not on file   Social Drivers of Health   Financial Resource Strain: Not on file  Food Insecurity: Not on file  Transportation Needs: Not on file  Physical Activity: Not on file  Stress: Not on file  Social Connections: Not on file  Intimate Partner Violence: Not on file     No Known Allergies   CBC    Component Value Date/Time   WBC 11.1 (H) 12/27/2023 0933   RBC 3.92 (L) 12/27/2023 0933   HGB 10.6 (L) 12/27/2023 0933   HGB 6.8 (LL) 04/29/2020 1059   HCT 33.8 (L) 12/27/2023 0933   HCT 19.8 (L) 04/29/2020 1059   PLT 372 12/27/2023 0933   PLT 193 04/29/2020 1059   MCV 86.2 12/27/2023 0933   MCV 90 04/29/2020 1059   MCH 27.0 12/27/2023 0933   MCHC 31.4 12/27/2023 0933   RDW 14.9 12/27/2023  0933   RDW 16.7 (H) 04/29/2020 1059   LYMPHSABS 1.0 12/27/2023 0933   MONOABS 1.3 (H) 12/27/2023 0933   EOSABS 0.2 12/27/2023 0933   BASOSABS 0.1 12/27/2023 0933    Pulmonary Functions Testing Results:     No data to display          Outpatient Medications Prior to Visit  Medication Sig Dispense Refill   aspirin  EC 81 MG tablet Take 81 mg by mouth daily. Swallow whole.     atorvastatin  (LIPITOR ) 80 MG tablet Take 80 mg by mouth daily.     carvedilol  (COREG ) 3.125 MG tablet Take 1 tablet (3.125 mg total) by mouth daily with breakfast. 30 tablet 0   [employer] GENTLE LAXATIVE 5 MG EC tablet TAKE 1 TABLET BY MOUTH DAILY AS NEEDED FOR MODERATE CONSTIPATION. TAKE TWO DAYS BEFORE PROCEDURE (Patient taking differently: daily as needed.) 30 tablet 0   cyanocobalamin  (,VITAMIN B-12,) 1000 MCG/ML injection Inject 1,000 mcg into the muscle every 30 (thirty) days.     cyanocobalamin  1000 MCG tablet Take 1,000 mcg by mouth daily.     fentaNYL  (DURAGESIC ) 25 MCG/HR Place 1 patch onto the skin every 3 (three) days for 15 days. 5 patch 0   ferrous sulfate  (FERROUSUL) 325 (65 FE) MG tablet Take 1 tablet (325 mg total) by mouth daily with breakfast.  0   furosemide  (LASIX ) 20 MG tablet Take 20 mg by mouth daily.     guaiFENesin  (MUCINEX ) 600 MG 12 hr tablet Take 600 mg by mouth 2 (two) times daily as needed for cough or to loosen phlegm.     losartan  (COZAAR ) 25 MG tablet Take 25 mg by mouth daily.  nitroGLYCERIN  (NITROSTAT ) 0.4 MG SL tablet Place 1 tablet (0.4 mg total) under the tongue every 5 (five) minutes as needed for chest pain. Patient must keep upcoming appointment for further refills 25 tablet 12   omeprazole  (PRILOSEC) 20 MG capsule Take 20 mg by mouth daily.     ondansetron  (ZOFRAN ) 8 MG tablet Take 1 tablet (8 mg total) by mouth every 8 (eight) hours as needed for nausea or vomiting. 20 tablet 0   potassium chloride  SA (KLOR-CON ) 20 MEQ tablet Take 20 mEq by mouth daily.     spironolactone   (ALDACTONE ) 50 MG tablet Take 50 mg by mouth daily.     apixaban  (ELIQUIS ) 5 MG TABS tablet Take 1 tablet (5 mg total) by mouth 2 (two) times daily. (Patient not taking: Reported on 01/01/2024) 60 tablet 5   oxyCODONE-acetaminophen  (PERCOCET/ROXICET) 5-325 MG tablet Take 1 tablet by mouth every 6 (six) hours as needed for severe pain (pain score 7-10). (Patient not taking: Reported on 01/01/2024) 6 tablet 0   No facility-administered medications prior to visit.

## 2024-01-01 NOTE — Telephone Encounter (Signed)
   Name: Tyion Boylen Bou  DOB: 08/25/1945  MRN: [id number]  Primary Cardiologist: Dorn Lesches, MD  Chart reviewed as part of pre-operative protocol coverage. Because of Caylor Tallarico Vannest's past medical history and time since last visit, he will require a follow-up in-office visit in order to better assess preoperative cardiovascular risk. Last seen by Dr. Lesches on 05/03/2021. Saw Dr. Waddell EP who does not provide cardiac clearance.  Pre-op covering staff: - Please schedule appointment and call patient to inform them. If patient already had an upcoming appointment within acceptable timeframe, please add pre-op clearance to the appointment notes so provider is aware. - Please contact requesting surgeon's office via preferred method (i.e, phone, fax) to inform them of need for appointment prior to surgery.  This message will also be routed to pharmacy pool for input on holding Eliquis  as requested below so that this information is available to the clearing provider at time of patient's appointment.   Lamarr Satterfield, NP  01/01/2024, 10:38 AM

## 2024-01-01 NOTE — Progress Notes (Signed)
 Cardiology Office Note    Date:  01/01/2024  ID:  Peter Lambert, DOB November 14, 1945, MRN [id number] PCP:  Bertell Satterfield, MD  Cardiologist:  Dorn Lesches, MD  Electrophysiologist:  None   Chief Complaint: Preoperative cardiac evaluation History of Present Illness: .   Peter Lambert is a 78 y.o. male with visit-pertinent history of CAD s/p DES to LAD in 2010, V-fib arrest in 2021 with LHC indicating occluded RCA and second marginal branch unable to be opened, EF at time of V-fib arrest was 20%, on LV gram on LHC had improved to 55%, hypertension, persistent atrial fibrillation on Eliquis , hyperlipidemia, tobacco use, PAD s/p remote aortobifemoral bypass grafting.  Patient with history of CAD s/p non-STEMI in 05/2008, patient underwent cardiac catheterization with Dr. Lesches revealing a high-grade proximal LAD stenosis, stented using a 2.75 x 18 mm long Xience 5 DES, patient had GL of the first diagonal branch and performed angioplasty on the ostium through stent struts.  In 2021 patient had V-fib arrest and underwent right and left heart cath by Dr. Cherrie revealing a patent LAD stent with an occluded second marginal branch with Dr. Verlin being unable to open, he had an occluded nondominant RCA, EF was 20% post arrest however EF improved to 55% on LV gram.  Patient was seen by Dr. Waddell and underwent ICD implantation on 09/05/2019.  It was also noted at that time patient was in new atrial flutter, started on amiodarone .  Patient was last seen in clinic on 07/18/2022 by Dr. Waddell, was having difficulties affording Eliquis , anticoagulation was changed however he is now back on Eliquis .  He was to continue on low-dose amiodarone  although does not appear he was taking the medication.  On 12/22/2023 patient presented to [facility] ED for hip pain and was found to have lytic lesions in the acetabulum.  CT chest abdomen pelvis demonstrated lung mass concerning for bronchogenic carcinoma.  Patient was  seen on 12/27/2023 at rapid diagnostic clinic [facility], patient noted increased shortness of breath especially with exertion, patient was walked in clinic with oxygen  dropping below 85%, started on 2 L nasal cannula, shortness of breath felt to be secondary to mass.  Patient started on fentanyl  patches for pain management.  Patient seen by pulmonology on 01/01/2024, PET/CTs scheduled for Friday.  Patient was an acute add on visit for urgent preoperative cardiac evaluation for bronchoscopy on 01/07/2024 with [place] [facility].  Presents today with his niece. Patient reports that he has been having episodes of chest pain, requires taking of nitroglycerin  every 2 to 3 weeks with improvement in symptoms, patient is unsure of how long this has been ongoing.  He is unable to tell me if this is associated with exertion, notes dyspnea on exertion that has been worsening in recent weeks.  Patient notes that chest discomfort is in the center of his chest, thought it might be related to indigestion however notes that is relieved with nitroglycerin , he is unable to say how long episodes last for.  He denies any increased lower extremity edema, orthopnea or PND, notes weight loss in recent weeks.  Patient also notes that he has been having significant pain in his hip and back.  Discussed with Dr. Lesches, patients primary cardiologist who recommended that he present to the emergency department given recurrent chest pain relieved with nitroglycerin  and atrial fibrillation.  Patient will need workup per Dr. Lesches prior to providing clearance for bronchoscopy.   Labwork independently reviewed: 12/27/2023:  Sodium 139, potassium 4.3, creatinine 2.20, hemoglobin 10.6, hematocrit 33.8, WBC 11.1 ROS: .   Today he denies lower extremity edema, melena, hematuria, hemoptysis, presyncope, syncope, orthopnea, and PND.  All other systems are reviewed and otherwise negative. Studies Reviewed: SABRA   EKG:  EKG is ordered  today, personally reviewed, demonstrating  EKG Interpretation Date/Time:  Wednesday January 01 2024 15:12:44 EST Ventricular Rate:  112 PR Interval:    QRS Duration:  150 QT Interval:  360 QTC Calculation: 491 R Axis:   97  Text Interpretation: Atrial fibrillation with rapid ventricular response with premature ventricular or aberrantly conducted complexes Right bundle branch block Confirmed by Krisna Omar [phone]) on 01/01/2024 3:33:13 PM   CV Studies: Cardiac studies reviewed are outlined and summarized above. Otherwise please see EMR for full report. Cardiac Studies & Procedures   ______________________________________________________________________________________________ CARDIAC CATHETERIZATION  CARDIAC CATHETERIZATION 09/03/2019  Conclusion  2nd Mrg lesion is 99% stenosed.  Please see Dr. Nelle full cath report for details of the diagnostic catheterization.  Unsuccessful attempt at PCI of the occluded OM branch. I was unable to cross the lesion with a wire. No balloon angioplasty performed.  Discussed case with Dr. Bensimhon. Will load with Plavix  and continue ASA.  Findings Coronary Findings Diagnostic  Dominance: Right  Left Circumflex  Second Obtuse Marginal Branch 2nd Mrg lesion is 99% stenosed.  Intervention  No interventions have been documented.   CARDIAC CATHETERIZATION 09/03/2019  Conclusion  Ost RCA to Prox RCA lesion is 100% stenosed.  Previously placed Prox LAD to Mid LAD stent (unknown type) is widely patent.  1st Diag lesion is 90% stenosed.  Ost LAD lesion is 30% stenosed.  Prox Cx to Mid Cx lesion is 50% stenosed.  Ost Cx to Prox Cx lesion is 40% stenosed.  2nd Mrg lesion is 99% stenosed.  Findings:  Ao = 116/58 (79) LV = 118/10 RA = 10 RV = 43/8 PA = 45/19 (26) PCW = 14 Fick cardiac output/index = 5.5/2.9 PVR = 2.2 Ao sat = 99% PA sat = 65%, 65%  Assessment: 1.Significant CAD with left dominant system 2. Separate ostia  for LAD & LCx 3. LAD with widely patent proximal sten. Mild non-obstructive CAD 4. LCX large dominant vessel with moderate diffuse disease. Culprit lesion appears to be dissected OM-2 branch with 99% lesion 5. RCA small non-dominant that is totally occluded 6. LVEF recovered at 55% 7. Well-compensated hemodynamics.  Plan/Discussion:  Suspect culprit lesion is OM-2 will attempt PCI if amenable. Otherwise medical therapy. If unable to revascularize, consider LifeVest on d/c.  Toribio Fuel, MD 9:25 AM  Findings Coronary Findings Diagnostic  Dominance: Left  Left Anterior Descending Ost LAD lesion is 30% stenosed. Previously placed Prox LAD to Mid LAD stent (unknown type) is widely patent.  First Diagonal Branch 1st Diag lesion is 90% stenosed.  Left Circumflex Ost Cx to Prox Cx lesion is 40% stenosed. Prox Cx to Mid Cx lesion is 50% stenosed.  Second Obtuse Marginal Branch 2nd Mrg lesion is 99% stenosed.  Right Coronary Artery Ost RCA to Prox RCA lesion is 100% stenosed.  Intervention  No interventions have been documented.   STRESS TESTS  MYOCARDIAL PERFUSION IMAGING 10/20/2014   ECHOCARDIOGRAM  ECHOCARDIOGRAM COMPLETE 08/30/2019  Narrative ECHOCARDIOGRAM REPORT    Patient Name:   Peter Lambert Date of Exam: 08/30/2019 Medical Rec #:  [id number]       Height:       68.0 in Accession #:    [id number]  Weight:       167.3 lb Date of Birth:  [birth year]      BSA:          1.895 m Patient Age:    73 years        BP:           81/76 mmHg Patient Gender: M               HR:           64 bpm. Exam Location:  Inpatient  Procedure: 2D Echo, Cardiac Doppler, Color Doppler and Intracardiac Opacification Agent  Indications:    Cardiac arrest  History:        Patient has prior history of Echocardiogram examinations, most recent 04/20/2019. CHF, CAD, COPD, Signs/Symptoms:Dyspnea; Risk Factors:Hypertension, Current Smoker and Dyslipidemia.  Sonographer:    Ellouise Mose RDCS Referring Phys: [id number] LEITA JONELLE EINSTEIN   Sonographer Comments: Technically difficult study due to poor echo windows and echo performed with patient supine and on artificial respirator. Image acquisition challenging due to respiratory motion. IMPRESSIONS   1. Left ventricular ejection fraction, by estimation, is 20%. The left ventricle has severely decreased function. The left ventricle demonstrates global hypokinesis, more prominent inferior/posterior walls. There is moderate left ventricular hypertrophy. Left ventricular diastolic parameters are consistent with Grade II diastolic dysfunction (pseudonormalization). Slow flow and stasis noted by Definity  contrast in LV particularly at apex. Although no formed thrombus, there is significant substrate for thrombus formation. 2. RV-RA gradient 25 mmHg. Right ventricular systolic function is severely reduced. The right ventricular size is normal. 3. The mitral valve is grossly normal, mildly thickened. Mild mitral valve regurgitation. 4. The aortic valve is tricuspid. Aortic valve regurgitation is not visualized. Mild aortic valve sclerosis is present, with no evidence of aortic valve stenosis. 5. Unable to estimate CVP.  FINDINGS Left Ventricle: Left ventricular ejection fraction, by estimation, is 20%. The left ventricle has severely decreased function. The left ventricle demonstrates global hypokinesis. Definity  contrast agent was given IV to delineate the left ventricular endocardial borders. The left ventricular internal cavity size was normal in size. There is moderate left ventricular hypertrophy. Left ventricular diastolic parameters are consistent with Grade II diastolic dysfunction (pseudonormalization).  Right Ventricle: RV-RA gradient 25 mmHg. The right ventricular size is normal. No increase in right ventricular wall thickness. Right ventricular systolic function is severely reduced.  Left Atrium: Left atrial size was normal  in size.  Right Atrium: Right atrial size was normal in size.  Pericardium: There is no evidence of pericardial effusion.  Mitral Valve: The mitral valve is grossly normal. There is mild thickening of the mitral valve leaflet(s). Mild mitral valve regurgitation.  Tricuspid Valve: The tricuspid valve is grossly normal. Tricuspid valve regurgitation is trivial.  Aortic Valve: The aortic valve is tricuspid. Aortic valve regurgitation is not visualized. Mild aortic valve sclerosis is present, with no evidence of aortic valve stenosis. Mild aortic valve annular calcification.  Pulmonic Valve: The pulmonic valve was grossly normal. Pulmonic valve regurgitation is trivial.  Aorta: The aortic root is normal in size and structure.  Venous: Unable to estimate CVP. IVC assessment for right atrial pressure unable to be performed due to mechanical ventilation.  IAS/Shunts: No atrial level shunt detected by color flow Doppler.   LEFT VENTRICLE PLAX 2D LVIDd:         4.80 cm     Diastology LVIDs:         4.40 cm  LV e' lateral:   2.64 cm/s LV PW:         1.50 cm     LV E/e' lateral: 14.7 LV IVS:        1.40 cm     LV e' medial:    2.87 cm/s LVOT diam:     2.20 cm     LV E/e' medial:  13.6 LV SV:         23 LV SV Index:   12 LVOT Area:     3.80 cm  LV Volumes (MOD) LV vol d, MOD A2C: 93.5 ml LV vol d, MOD A4C: 95.8 ml LV vol s, MOD A2C: 68.4 ml LV vol s, MOD A4C: 71.8 ml LV SV MOD A2C:     25.1 ml LV SV MOD A4C:     95.8 ml LV SV MOD BP:      23.8 ml  RIGHT VENTRICLE            IVC RV S prime:     4.73 cm/s  IVC diam: 1.90 cm TAPSE (M-mode): 1.0 cm  LEFT ATRIUM           Index       RIGHT ATRIUM           Index LA diam:      3.90 cm 2.06 cm/m  RA Area:     16.70 cm LA Vol (A2C): 27.0 ml 14.25 ml/m RA Volume:   44.20 ml  23.33 ml/m LA Vol (A4C): 25.6 ml 13.51 ml/m AORTIC VALVE LVOT Vmax:   40.70 cm/s LVOT Vmean:  25.800 cm/s LVOT VTI:    0.060 m  AORTA Ao Root diam:  3.60 cm  MITRAL VALVE               TRICUSPID VALVE MV Area (PHT): 2.62 cm    TR Peak grad:   25.2 mmHg MV Decel Time: 289 msec    TR Vmax:        251.00 cm/s MV E velocity: 38.90 cm/s MV A velocity: 42.10 cm/s  SHUNTS MV E/A ratio:  0.92        Systemic VTI:  0.06 m Systemic Diam: 2.20 cm  Jayson Sierras MD Electronically signed by Jayson Sierras MD Signature Date/Time: 08/30/2019/4:40:40 PM    Final          ______________________________________________________________________________________________       Current Reported Medications:.    Current Meds  Medication Sig   aspirin  EC 81 MG tablet Take 81 mg by mouth daily. Swallow whole.   atorvastatin  (LIPITOR ) 80 MG tablet Take 80 mg by mouth daily.   carvedilol  (COREG ) 3.125 MG tablet Take 1 tablet (3.125 mg total) by mouth daily with breakfast.   [employer] GENTLE LAXATIVE 5 MG EC tablet TAKE 1 TABLET BY MOUTH DAILY AS NEEDED FOR MODERATE CONSTIPATION. TAKE TWO DAYS BEFORE PROCEDURE (Patient taking differently: Take 5 mg by mouth daily as needed for mild constipation or moderate constipation.)   cyanocobalamin  (,VITAMIN B-12,) 1000 MCG/ML injection Inject 1,000 mcg into the muscle every 30 (thirty) days.   cyanocobalamin  1000 MCG tablet Take 1,000 mcg by mouth daily.   fentaNYL  (DURAGESIC ) 25 MCG/HR Place 1 patch onto the skin every 3 (three) days for 15 days.   ferrous sulfate  (FERROUSUL) 325 (65 FE) MG tablet Take 1 tablet (325 mg total) by mouth daily with breakfast.   furosemide  (LASIX ) 20 MG tablet Take 20 mg by mouth daily.   guaiFENesin  (MUCINEX ) 600 MG  12 hr tablet Take 600 mg by mouth 2 (two) times daily as needed for cough or to loosen phlegm.   losartan  (COZAAR ) 25 MG tablet Take 25 mg by mouth daily.   nitroGLYCERIN  (NITROSTAT ) 0.4 MG SL tablet Place 1 tablet (0.4 mg total) under the tongue every 5 (five) minutes as needed for chest pain. Patient must keep upcoming appointment for further refills   omeprazole   (PRILOSEC) 20 MG capsule Take 20 mg by mouth daily.   ondansetron  (ZOFRAN ) 8 MG tablet Take 1 tablet (8 mg total) by mouth every 8 (eight) hours as needed for nausea or vomiting.   potassium chloride  SA (KLOR-CON ) 20 MEQ tablet Take 20 mEq by mouth daily.   spironolactone  (ALDACTONE ) 50 MG tablet Take 50 mg by mouth daily.   Physical Exam:    VS:  BP (!) 130/42   Pulse (!) 112   Ht 5' 8 (1.727 m)   Wt 146 lb 6.4 oz (66.4 kg)   SpO2 93%   BMI 22.26 kg/m    Wt Readings from Last 3 Encounters:  01/01/24 146 lb 6.4 oz (66.4 kg)  01/01/24 145 lb (65.8 kg)  12/27/23 143 lb 4.8 oz (65 kg)    GEN: Ill appearing, thing  NECK: No JVD; No carotid bruits CARDIAC: IRIR, no murmurs, rubs, gallops RESPIRATORY:  Diminished lung sounds bilateral bases, expiratory wheeze  ABDOMEN: Soft, non-tender, appears distended  EXTREMITIES:  No edema; No acute deformity     Asessement and Plan:.    CAD: s/p stenting of proximal LAD in 2010, cath in setting of V-fib arrest in 2021 indicated occluded RCA, culprit lesion felt to be OM 2, unable to be stented.  Today patient reports episodes of chest pain relieved with sublingual nitroglycerin  that occur every 2-3 weeks.  Patient is unable to provide much detail regarding his episodes of chest pain, is unable determine if associated with exertion or increased shortness of breath, notes only that nitroglycerin  resolves discomfort. He is unsure of how long he has been having episodes of chest pain.  Patient reports that while and route to office visit today he required a sublingual nitroglycerin  for chest pain, had complete resolution of pain after a single sublingual nitroglycerin .  Denies any current chest pain, discomfort or tightness.  EKG today indicates atrial fibrillation at 112 bpm. Per Dr. Court patient will need further workup prior to bronchoscopy, recommended that patient present to the emergency department given chest pain prior to appointment that improved  with sublingual nitroglycerin , patient in agreement with plan.   Heart failure: At time of V-fib arrest in 2021 EF initially less than 20% with RV severe hypokinesis post arrest, EF improved to 55% on LV gram, felt to be related to stunning. Patient has not had repeat echocardiogram since 2021.  Recent device indicates increases in HF logistics also with atrial fibrillation with poor rate control per notes.  Patient reports increased shortness of breath, denies any increased lower extremity edema, orthopnea or PND.  Patient has been losing weight.  Patient will need echocardiogram while inpatient.  Atrial flutter/fibrillation: Patient with diagnosis of atrial flutter/atrial fibrillation during hospitalization in 2021.  Underwent ICD implantation at that time with Dr. Waddell.  Has been followed by EP, device interrogation on 11/3 with increases in A-fib burden, respiratory rate and drops in thoracic impedance.  Patient reports that he has not been on Eliquis  in recent days, per patient was told by a pharmacist not to take Eliquis  while on pain medications.  On carvedilol  3.125 mg twice daily, pending renal function while inpatient may need Eliquis  dose adjusted.   CKD: Last creatinine 2.20 on 12/27/23.   Tobacco use: Patient with previous tobacco use, reports that he stopped smoking 4 years ago.  Lung mass: Patient recently found to have pulmonary mass with possible metastatic malignancy.  To undergo PET scan on Friday, initially planedn for bronchoscopy next week.  Patient reports weight loss as well as significant pain to the hip even with fentanyl  patch, also with worsening shortness of breath in recent weeks per patient is to use oxygen  during exertion.  Preoperative cardiac evaluation: Discussed with Dr. Court, patient will need workup prior to bronchoscopy. Patient to present to the emergency department given significant pain, increased shortness of breath and chest pain.    Disposition: Patient  to present to the emergency department for further workup.   Signed, Sallye Lunz D Chuckie Mccathern, NP

## 2024-01-01 NOTE — Patient Instructions (Signed)
  VISIT SUMMARY: Today, you were seen for an evaluation of lung lesions found on a recent scan. You have a history of heart attack and atrial fibrillation, and you have been experiencing a persistent cough and some weight loss. We discussed the findings and planned the next steps for further evaluation and management.  YOUR PLAN:  -PULMONARY NODULE OF UNCERTAIN ETIOLOGY: A pulmonary nodule is a small, round growth in the lung that can be benign or malignant. Given your history and symptoms, we need to investigate further to determine the cause. We will schedule a bronchoscopy with EBUS (a procedure to look inside your lungs and take a biopsy) to examine the nodule. Please ensure you stop taking Eliquis  48 hours before the procedure. We will also get clearance from your cardiologist before proceeding. After the biopsy, we will coordinate with your oncology team for follow-up.  -ATRIAL FIBRILLATION: Atrial fibrillation is an irregular and often rapid heart rate that can increase your risk of strokes and other heart-related complications. You are currently managing this condition with Eliquis  and amiodarone . Since you need to stop Eliquis  for the upcoming bronchoscopy, we will get clearance from your cardiologist. After the procedure, you should resume Eliquis  as advised by your cardiologist.  INSTRUCTIONS: Please stop taking Eliquis  48 hours before your scheduled bronchoscopy with EBUS. Ensure you get cardiology clearance for the procedure. Follow up with your oncology team after the biopsy results are available.                      Contains text generated by [employer]bridge.                                 Contains text generated by [employer]bridge.

## 2024-01-01 NOTE — Telephone Encounter (Signed)
 Patient with diagnosis of atrial fibrillation on Eliquis  for anticoagulation.    Procedure:  Bronchoscopy   Date of Surgery:  Clearance 01/07/24    CHA2DS2-VASc Score = 6   This indicates a 9.7% annual risk of stroke. [name] score is based upon: CHF History: 1 HTN History: 1 Diabetes History: 1 Stroke History: 0 Vascular Disease History: 1 Age Score: 2 Gender Score: 0    CrCl 25 Platelet count 372  Patient has not had an Afib/aflutter ablation in the last 3 months, DCCV within the last 4 weeks or a watchman implanted in the last 45 days   Per office protocol, patient can hold Eliquis  for 3 days prior to procedure.  (Due to low CrCl) Patient will not need bridging with Lovenox  (enoxaparin ) around procedure.  **This guidance is not considered finalized until pre-operative APP has relayed final recommendations.**

## 2024-01-01 NOTE — Telephone Encounter (Signed)
 Pt scheduled with K. West 11/5 at 3:10.

## 2024-01-01 NOTE — ED Triage Notes (Signed)
 Pt sent from the doctors office with pain in his chest and he's in af with rvr

## 2024-01-01 NOTE — Telephone Encounter (Signed)
 Left message for the pt that he needs to call back ASAP to schedule an appt in office for preop clearance. Pt can see gen card app as pt last saw Dr. Court 2023 and is still in the 50yr time frame.   I will update the requesting office the pt is going to need an appt in the office for preop clearance.

## 2024-01-01 NOTE — ED Triage Notes (Signed)
 Pt to er, pt states that the cardiologist across the street sent him over, states that he was told that he was in heart failure and to come to the er.

## 2024-01-01 NOTE — Consult Note (Incomplete)
 Cardiology Consultation   Patient ID: Peter Lambert [place] MRN: [id number]; DOB: 17-Dec-1945  Admit date: 01/01/2024 Date of Consult: 01/01/2024  PCP:  Bertell Satterfield, MD   [facility] Providers Cardiologist:  Dorn Lesches, MD   { Click here to update MD or APP on Care Team, Refresh:1}    Patient Profile: Peter Lambert is a 78 y.o. male with visit-pertinent history of CAD s/p DES to LAD in 2010, V-fib arrest in 2021 with LHC indicating occluded RCA and second marginal branch unable to be opened, EF at time of V-fib arrest was 20%, on LV gram on LHC had improved to 55%, hypertension, persistent atrial fibrillation on Eliquis , hyperlipidemia, tobacco use, PAD s/p remote aortobifemoral bypass grafting, who is being seen 01/01/2024 for the evaluation of chest pain at the request of Dr. Francesca.  History of Present Illness: Per outpatient cardiology note today, Patient was an acute add on visit for urgent preoperative cardiac evaluation for bronchoscopy on 01/07/2024 with [facility].  Presents today with his niece. Patient reports that he has been having episodes of chest pain, requires taking of nitroglycerin  every 2 to 3 weeks with improvement in symptoms, patient is unsure of how long this has been ongoing.  He is unable to tell me if this is associated with exertion, notes dyspnea on exertion that has been worsening in recent weeks.  Patient notes that chest discomfort is in the center of his chest, thought it might be related to indigestion however notes that is relieved with nitroglycerin , he is unable to say how long episodes last for.  He denies any increased lower extremity edema, orthopnea or PND, notes weight loss in recent weeks.  Patient also notes that he has been having significant pain in his hip and back.  Discussed with Dr. Lesches, patients primary cardiologist who recommended that he present to the emergency department given recurrent chest pain relieved with  nitroglycerin  and atrial fibrillation.  Patient will need workup per Dr. Lesches prior to providing clearance for bronchoscopy.   Sent to the ED for more expedited workup. He is currently asymptomatic. His retelling of his chest pain seems to change from his outpatient visit. When he describes his chest pain episode to me now, he describes the episode as a right sided chest pain that occurred after popping a peppermint, but did seem to improve with nitroglycerin  yesterday. He notes that the last time he had to take nitroglycerin  prior to yesterday's event was in July. He isn't very active at home, <4 METS of activity. Is able to walk around with his cane without chest pain or SOB though. Currently feels asymptomatic.  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 PET CT scheduled for 01/03/24 and bronchoscopy with biopsy is planned for 01/07/24. Plan was to hold eliquis  for 3 days prior to his procedure (start holding 01/04/24).  Past Medical History:  Diagnosis Date   Acute upper GI bleed    Coronary artery disease    a. s/p NSTEMI in 2010 with DES to proximal LAD with D1 jailed and angioplasty alone to ostium   Hyperlipidemia    Hypertension    Hypertension    Peripheral arterial disease    Tobacco abuse    Type 2 diabetes mellitus (HCC)     Past Surgical History:  Procedure Laterality Date   CARDIAC SURGERY     COLONOSCOPY WITH PROPOFOL  N/A 01/08/2020   Procedure: COLONOSCOPY WITH PROPOFOL ;  Surgeon: Cindie Carlin POUR, DO;  Location: [facility];  Service: Endoscopy;  Laterality: N/A;   COLONOSCOPY WITH PROPOFOL  N/A 05/03/2020   Hung:entire examined colon normal   CORONARY STENT INTERVENTION N/A 09/03/2019   Procedure: CORONARY STENT INTERVENTION;  Surgeon: Verlin Lonni BIRCH, MD;  Location: [facility];  Service: Cardiovascular;  Laterality: N/A;   CORONARY STENT PLACEMENT     ENTEROSCOPY N/A 05/03/2020    hung:normal esophagus, normal stomach. Three non-bleeding angiodysplastic lesions in the duodenum. Treated with a monopolar probe. A few non-bleeding angiodysplastic lesions in the jejunum. Treated with a monopolar probe. No specimens collected.   ESOPHAGOGASTRODUODENOSCOPY (EGD) WITH PROPOFOL  N/A 01/06/2020   Procedure: ESOPHAGOGASTRODUODENOSCOPY (EGD) WITH PROPOFOL ;  Surgeon: Golda Claudis PENNER, MD;  Location: [facility];  Service: Endoscopy;  Laterality: N/A;   HOT HEMOSTASIS N/A 05/03/2020   Procedure: HOT HEMOSTASIS (ARGON PLASMA COAGULATION/BICAP);  Surgeon: Rollin Dover, MD;  Location: [facility] ENDOSCOPY;  Service: Gastroenterology;  Laterality: N/A;   ICD IMPLANT N/A 09/04/2019   Procedure: ICD IMPLANT;  Surgeon: Waddell Danelle ORN, MD;  Location: [facility] INVASIVE CV LAB;  Service: Cardiovascular;  Laterality: N/A;   RIGHT/LEFT HEART CATH AND CORONARY ANGIOGRAPHY N/A 09/03/2019   Procedure: RIGHT/LEFT HEART CATH AND CORONARY ANGIOGRAPHY;  Surgeon: Cherrie Toribio SAUNDERS, MD;  Location: [facility];  Service: Cardiovascular;  Laterality: N/A;     {Home Medications (Optional):21181}  Scheduled Meds:  Continuous Infusions:  PRN Meds:   Allergies:   No Known Allergies  Social History:   Social History   Socioeconomic History   Marital status: Divorced    Spouse name: Not on file   Number of children: Not on file   Years of education: Not on file   Highest education level: Not on file  Occupational History   Not on file  Tobacco Use   Smoking status: Former    Current packs/day: 0.50    Average packs/day: 0.5 packs/day for 52.0 years (26.0 ttl pk-yrs)    Types: Cigarettes   Smokeless tobacco: Never  Vaping Use   Vaping status: Never Used  Substance and Sexual Activity   Alcohol use: No   Drug use: No   Sexual activity: Never  Other Topics Concern   Not on file  Social History Narrative   Not on file   Social Drivers of Health   Financial Resource Strain: Not on file   Food Insecurity: Not on file  Transportation Needs: Not on file  Physical Activity: Not on file  Stress: Not on file  Social Connections: Not on file  Intimate Partner Violence: Not on file    Family History:   *** Family History  Problem Relation Age of Onset   Colon cancer Mother        thinks it was colon cancer   Cancer Mother        not sure location    Prostate cancer Father    Dementia Sister    Colon cancer Brother    Stomach cancer Neg Hx      ROS:  Please see the history of present illness.  *** All other ROS reviewed and negative.     Physical Exam/Data: Vitals:   01/01/24 1644 01/01/24 1656 01/01/24 2158  BP: 119/69  (!) 145/88  Pulse: 85  (!) 116  Resp: 16  16  Temp: 98.7 F (37.1 C)    TempSrc: Oral    SpO2: 94%  98%  Weight:  66.2 kg   Height:  5' 8 (1.727 m)    No intake or output data in the 24 hours ending 01/01/24 2311    01/01/2024    4:56 PM 01/01/2024    3:08 PM 01/01/2024    9:31 AM  Last 3 Weights  Weight (lbs) 146 lb 146 lb 6.4 oz 145 lb  Weight (kg) 66.225 kg 66.407 kg 65.772 kg     Body mass index is 22.2 kg/m.  General:  Well nourished, well developed, in no acute distress*** HEENT: normal Neck: no JVD Vascular: No carotid bruits; Distal pulses 2+ bilaterally Cardiac:  normal S1, S2; RRR; no murmur *** Lungs:  clear to auscultation bilaterally, no wheezing, rhonchi or rales  Abd: soft, nontender, no hepatomegaly  Ext: no edema Musculoskeletal:  No deformities, BUE and BLE strength normal and equal Skin: warm and dry  Neuro:  CNs 2-12 intact, no focal abnormalities noted Psych:  Normal affect   EKG:  The EKG was personally reviewed and demonstrates:  *** Telemetry:  Telemetry was personally reviewed and demonstrates:  ***  Relevant CV Studies: ***  Laboratory Data: High Sensitivity Troponin:   Recent Labs  Lab 01/01/24 1705  TROPONINIHS 13     Chemistry Recent Labs  Lab 12/27/23 0933 01/01/24 1705  NA 139 137   K 4.3 4.1  CL 100 100  CO2 24 24  GLUCOSE 120* 117*  BUN 35* 17  CREATININE 2.20* 1.44*  CALCIUM  10.5* 11.0*  GFRNONAA 30* 49*  ANIONGAP 15 13    Recent Labs  Lab 12/27/23 0933  PROT 8.0  ALBUMIN 3.8  AST 49*  ALT 30  ALKPHOS 117  BILITOT 0.4   Lipids No results for input(s): CHOL, TRIG, HDL, LABVLDL, LDLCALC, CHOLHDL in the last 168 hours.  Hematology Recent Labs  Lab 12/27/23 0933 01/01/24 1705  WBC 11.1* 10.1  RBC 3.92* 3.85*  HGB 10.6* 10.0*  HCT 33.8* 33.2*  MCV 86.2 86.2  MCH 27.0 26.0  MCHC 31.4 30.1  RDW 14.9 14.6  PLT 372 380   Thyroid  No results for input(s): TSH, FREET4 in the last 168 hours.  BNP Recent Labs  Lab 01/01/24 1705  BNP 224.1*    DDimer No results for input(s): DDIMER in the last 168 hours.  Radiology/Studies:  CT ABDOMEN PELVIS W CONTRAST Result Date: 01/01/2024 EXAM: CT ABDOMEN AND PELVIS WITH CONTRAST 01/01/2024 07:13:00 PM TECHNIQUE: CT of the abdomen and pelvis was performed with the administration of 50 mL of iohexol  (OMNIPAQUE ) 350 MG/ML injection. Multiplanar reformatted images are provided for review. Automated exposure control, iterative reconstruction, and/or weight-based adjustment of the mA/kV was utilized to reduce the radiation dose to as low as reasonably achievable. COMPARISON: 12/22/2023 CLINICAL HISTORY: Bowel obstruction suspected. FINDINGS: LOWER CHEST: See chest CT report today. LIVER: The liver is unremarkable. GALLBLADDER AND BILE DUCTS: Gallbladder is unremarkable. No biliary ductal dilatation. SPLEEN: No acute abnormality. PANCREAS: No acute abnormality. ADRENAL GLANDS: No acute abnormality. KIDNEYS, URETERS AND BLADDER: No stones in the kidneys or ureters. No hydronephrosis. No perinephric or periureteral stranding. Urinary bladder is unremarkable. GI AND BOWEL: Gaseous distention of the bowel involves both large and small bowel; however, distal small bowel loops are decompressed. While I favor this  most likely reflects ileus, it is difficult to completely exclude distal small bowel obstruction. Moderate stool burden throughout the colon. Normal appendix. PERITONEUM AND RETROPERITONEUM: No ascites. No free air. VASCULATURE: Aorta is normal in caliber. Changes of prior aortobifemoral bypass. LYMPH NODES: No lymphadenopathy. REPRODUCTIVE ORGANS: Prostate enlargement.  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 No focal soft tissue abnormality. IMPRESSION: 1. Gaseous distention of bowel involving both large and small bowel with decompressed distal small bowel loops, most likely ileus; distal small bowel obstruction cannot be completely excluded. 2. 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 Electronically signed by: Franky Crease MD 01/01/2024 07:31 PM EST RP Workstation: [id number]   CT Angio Chest PE W and/or Wo Contrast Result Date: 01/01/2024 EXAM: CTA of the Chest without and with contrast for PE 01/01/2024 07:13:00 PM TECHNIQUE: CTA of the chest was performed without and with the administration of 50 mL of iohexol  (OMNIPAQUE ) 350 MG/ML injection. Multiplanar reformatted images are provided for review. MIP images are provided for review. Automated exposure control, iterative reconstruction, and/or weight based adjustment of the mA/kV was utilized to reduce the radiation dose to as low as reasonably achievable. COMPARISON: 12/22/2023 CLINICAL HISTORY: Pulmonary embolism (PE) suspected, high prob. FINDINGS: PULMONARY ARTERIES: Pulmonary arteries are adequately opacified for evaluation. No pulmonary embolism. Main pulmonary artery is normal in caliber. MEDIASTINUM: The heart and pericardium demonstrate no acute abnormality. There is no acute abnormality of the thoracic aorta. Right hilar and mediastinal adenopathy again noted, stable. LYMPH NODES: Right hilar and mediastinal  adenopathy again noted, stable. No axillary lymphadenopathy. LUNGS AND PLEURA: Right upper lobe mass measures 3 x 3 cm on image 58. No focal consolidation or pulmonary edema. No pleural effusion or pneumothorax. UPPER ABDOMEN: Limited images of the upper abdomen are unremarkable. SOFT TISSUES AND BONES: Numerous lytic bony metastases throughout the ribs with soft tissue masses are unchanged. Stable lytic metastases throughout the thoracic spine. IMPRESSION: 1. No evidence of pulmonary embolism. 2. Stable right upper lobe mass with unchanged right hilar and mediastinal adenopathy and diffuse lytic osseous metastases in the ribs and thoracic spine. Electronically signed by: Franky Crease MD 01/01/2024 07:26 PM EST RP Workstation: [id number]   DG Chest 2 View Result Date: 01/01/2024 EXAM: 2 VIEW(S) XRAY OF THE CHEST 01/01/2024 06:36:16 PM COMPARISON: None available. CLINICAL HISTORY: chest pain FINDINGS: LINES, TUBES AND DEVICES: Left chest pacemaker with leads overlying right atrium and right ventricle. LUNGS AND PLEURA: Right upper lung opacity. Right hilar asymmetric density. Ovoid pleural-based opacity at the right apex corresponding to rib lesion on CT. No pulmonary edema. No pleural effusion. No pneumothorax. HEART AND MEDIASTINUM: No acute abnormality of the cardiac an silhouettes. BONES AND SOFT TISSUES: Known right second and third rib lesions. IMPRESSION: 1. Right upper lung and hilar opacity corresponding to the mass recently demonstrated on CT. 2. Right second and third rib lesions corresponding to metastatic lesions seen on CT Electronically signed by: Luke Bun MD 01/01/2024 06:56 PM EST RP Workstation: [id number]     Assessment and Plan: ***   Risk Assessment/Risk Scores: {Complete the following score calculators/questions to meet required metrics.  Press F2         :[id number]}   {Is the patient being seen for unstable angina, ACS, NSTEMI or STEMI?:[id number]} {Does this patient have CHF  or CHF symptoms?      :[id number]} {Does this patient have ATRIAL FIBRILLATION?:[phone]}  {Are we signing off today?:[id number]}  For questions or updates, please contact [facility] Please consult [email or web address] for contact info under    {TIP  Split Shared Billing  Do NOT delete any part of this including brackets If split shared billing is based upon MDM, disregard If billing will be based upon  TIME you MUST document the number of minutes and a detailed list of what was done in that time in the following format Example - I spent ** minutes seeing this patient. During that time I reviewed their history, evaluated their symptoms, reviewed available labs, EKGs, studies, performed an exam and formulated an assessment and plan   :1} {Select this only if you need to document critical care time (Optional):[phone]} Signed, Curtistine LITTIE Farr, MD  01/01/2024 11:11 PM

## 2024-01-01 NOTE — H&P (View-Only) (Signed)
 Assessment & Plan:   #Lung mass (Primary)  The patient is here to discuss their imaging abnormalities which include a RUL/perihilar pulmonary mass with metastatic malignancy very high on the differential diagnosis. Discussed need for tissue biopsy, and will schedule bronchoscopy with EBUS for biopsy. Ensure he is off Eliquis  for 48 hours before the procedure. Will ask for cardiology clearance for the procedure given underlying history of CAD, Afib, and prior Vfib arrest. Coordinate with the oncology team for follow-up after biopsy results. Will attempt to expedite diagnosis and I will schedule patient with my colleague Dr. Malka for bronchoscopy and biopsy as early as next week, contingent on cardiology evaluation.  We discussed the importance of diagnosis and staging in lung malignancies, and the approach to obtaining a tissue diagnosis which would include robotic assisted navigational bronchoscopy with endobronchial ultrasound guided sampling.  We also discussed the risks associated with the procedure which include a 2% risk of pneumothorax, infection, bleeding, and nondiagnostic procedure in detail.  I explained that patients typically are able to return home the same day of the procedure, but in rare cases admission to the hospital for observation and treatment is required.  After our discussion, the patient elected to proceed with the procedure  Recommendations:  - Procedural/ Surgical Case Request: ENDOBRONCHIAL ULTRASOUND (EBUS); Future - Follow up PET/CT - Cardiology evaluation for preoperative risk assessment and optimization   [name], MD [facility]  I spent 60 minutes caring for this patient today, including preparing to see the patient, obtaining a medical history , reviewing a separately obtained history, performing a medically appropriate examination and/or evaluation, counseling and educating the patient/family/caregiver, ordering medications,  tests, or procedures, documenting clinical information in the electronic health record, and independently interpreting results (not separately reported/billed) and communicating results to the patient/family/caregiver  End of visit medications:  No orders of the defined types were placed in this encounter.    Current Outpatient Medications:    aspirin  EC 81 MG tablet, Take 81 mg by mouth daily. Swallow whole., Disp: , Rfl:    atorvastatin  (LIPITOR ) 80 MG tablet, Take 80 mg by mouth daily., Disp: , Rfl:    carvedilol  (COREG ) 3.125 MG tablet, Take 1 tablet (3.125 mg total) by mouth daily with breakfast., Disp: 30 tablet, Rfl: 0   [employer] GENTLE LAXATIVE 5 MG EC tablet, TAKE 1 TABLET BY MOUTH DAILY AS NEEDED FOR MODERATE CONSTIPATION. TAKE TWO DAYS BEFORE PROCEDURE (Patient taking differently: daily as needed.), Disp: 30 tablet, Rfl: 0   cyanocobalamin  (,VITAMIN B-12,) 1000 MCG/ML injection, Inject 1,000 mcg into the muscle every 30 (thirty) days., Disp: , Rfl:    cyanocobalamin  1000 MCG tablet, Take 1,000 mcg by mouth daily., Disp: , Rfl:    fentaNYL  (DURAGESIC ) 25 MCG/HR, Place 1 patch onto the skin every 3 (three) days for 15 days., Disp: 5 patch, Rfl: 0   ferrous sulfate  (FERROUSUL) 325 (65 FE) MG tablet, Take 1 tablet (325 mg total) by mouth daily with breakfast., Disp: , Rfl: 0   furosemide  (LASIX ) 20 MG tablet, Take 20 mg by mouth daily., Disp: , Rfl:    guaiFENesin  (MUCINEX ) 600 MG 12 hr tablet, Take 600 mg by mouth 2 (two) times daily as needed for cough or to loosen phlegm., Disp: , Rfl:    losartan  (COZAAR ) 25 MG tablet, Take 25 mg by mouth daily., Disp: , Rfl:    nitroGLYCERIN  (NITROSTAT ) 0.4 MG SL tablet, Place 1 tablet (0.4 mg total) under the tongue  every 5 (five) minutes as needed for chest pain. Patient must keep upcoming appointment for further refills, Disp: 25 tablet, Rfl: 12   omeprazole  (PRILOSEC) 20 MG capsule, Take 20 mg by mouth daily., Disp: , Rfl:    ondansetron  (ZOFRAN ) 8  MG tablet, Take 1 tablet (8 mg total) by mouth every 8 (eight) hours as needed for nausea or vomiting., Disp: 20 tablet, Rfl: 0   potassium chloride  SA (KLOR-CON ) 20 MEQ tablet, Take 20 mEq by mouth daily., Disp: , Rfl:    spironolactone  (ALDACTONE ) 50 MG tablet, Take 50 mg by mouth daily., Disp: , Rfl:    apixaban  (ELIQUIS ) 5 MG TABS tablet, Take 1 tablet (5 mg total) by mouth 2 (two) times daily. (Patient not taking: Reported on 01/01/2024), Disp: 60 tablet, Rfl: 5   Subjective:   PATIENT ID: Peter Lambert GENDER: male DOB: 1945-06-26, MRN: [id number]  Chief Complaint  Patient presents with   Consult    Occasional cough. Shortness of breath on exertion.     HPI  Discussed the use of AI scribe software for clinical note transcription with the patient, who gave verbal consent to proceed.  Peter Lambert is an 78 year old male with a history of atrial fibrillation and ICD who presents with findings of lung mass concerning for metastatic lung cancer.  He started experiencing a cough around the end of September or early October. The cough was initially persistent but has since decreased in severity. It is productive of clear phlegm and is not associated with hemoptysis. No chest pain or swelling in the arms, neck, or face.  Patient was seen in the ED on 12/22/2023 with hip pain, and was found to have lytic lesions in the acetabulum. A CT scan of the chest was concerning for lung malignancy, and he is referred to pulmonary for further evaluation. CT scan showed a 4 cm posterior RUL/perihilar mass, as well as signs of thoracic nodal metastasis, and extensive osseous metastatic disease.  He has experienced pain in his leg, hip, and back, which led him to seek emergency care. During this visit, he was informed of the possibility of cancer and was given pain medication. He has been seen by oncology and referred to us  for potential biopsy. PET/CT is scheduled for this Friday.  He notes a weight  loss of approximately 5-6 pounds over the past few months, with his weight decreasing from 150 pounds to 146 pounds. He attributes this to a decreased appetite, stating 'the older you get, the less you eat.' He has not had night sweats or chills.  His past medical history includes NSTEMI in 2010 (LAD s/p DES), PAD (s/p aortobifemoral bypass), cardiac arrest 2021 due to Vfib (s/p ICD placement), for which he has a defibrillator. He also has atrial fibrillation and is on Eliquis . He has not taken Eliquis  for the past three days as per instructions from a nurse, pending further guidance.  He has a significant smoking history, having smoked less than a pack a day from the age of 25 until he quit four years ago at the age of 61. He has around 50 to 60 pack years of smoking history     Ancillary information including prior medications, full medical/surgical/family/social histories, and PFTs (when available) are listed below and have been reviewed.    Review of Systems  Constitutional:  Negative for chills, fever, malaise/fatigue and weight loss.  Respiratory:  Positive for cough. Negative for hemoptysis, sputum production, shortness of breath and  wheezing.   Cardiovascular:  Negative for chest pain.     Objective:   Vitals:   01/01/24 0931  BP: (!) 140/80  Pulse: 66  Temp: 97.8 F (36.6 C)  TempSrc: Temporal  SpO2: 98%  Weight: 145 lb (65.8 kg)  Height: 5' 8 (1.727 m)   98% on RA  BMI Readings from Last 3 Encounters:  01/01/24 22.05 kg/m  12/27/23 21.79 kg/m  12/22/23 25.39 kg/m   Wt Readings from Last 3 Encounters:  01/01/24 145 lb (65.8 kg)  12/27/23 143 lb 4.8 oz (65 kg)  12/22/23 167 lb (75.8 kg)    Physical Exam Constitutional:      Appearance: He is ill-appearing (thin appearing).  Cardiovascular:     Rate and Rhythm: Normal rate and regular rhythm.     Pulses: Normal pulses.     Heart sounds: Normal heart sounds.  Pulmonary:     Effort: Pulmonary effort is  normal.     Breath sounds: Normal breath sounds. No wheezing or rales.  Neurological:     General: No focal deficit present.     Mental Status: He is alert and oriented to person, place, and time. Mental status is at baseline.       Ancillary Information    Past Medical History:  Diagnosis Date   Acute upper GI bleed    Coronary artery disease    a. s/p NSTEMI in 2010 with DES to proximal LAD with D1 jailed and angioplasty alone to ostium   Hyperlipidemia    Hypertension    Hypertension    Peripheral arterial disease    Tobacco abuse    Type 2 diabetes mellitus (HCC)      Family History  Problem Relation Age of Onset   Colon cancer Mother        thinks it was colon cancer   Cancer Mother        not sure location    Prostate cancer Father    Dementia Sister    Colon cancer Brother    Stomach cancer Neg Hx      Past Surgical History:  Procedure Laterality Date   CARDIAC SURGERY     COLONOSCOPY WITH PROPOFOL  N/A 01/08/2020   Procedure: COLONOSCOPY WITH PROPOFOL ;  Surgeon: Cindie Carlin POUR, DO;  Location: [facility];  Service: Endoscopy;  Laterality: N/A;   COLONOSCOPY WITH PROPOFOL  N/A 05/03/2020   Hung:entire examined colon normal   CORONARY STENT INTERVENTION N/A 09/03/2019   Procedure: CORONARY STENT INTERVENTION;  Surgeon: Verlin Lonni BIRCH, MD;  Location: [facility];  Service: Cardiovascular;  Laterality: N/A;   CORONARY STENT PLACEMENT     ENTEROSCOPY N/A 05/03/2020   hung:normal esophagus, normal stomach. Three non-bleeding angiodysplastic lesions in the duodenum. Treated with a monopolar probe. A few non-bleeding angiodysplastic lesions in the jejunum. Treated with a monopolar probe. No specimens collected.   ESOPHAGOGASTRODUODENOSCOPY (EGD) WITH PROPOFOL  N/A 01/06/2020   Procedure: ESOPHAGOGASTRODUODENOSCOPY (EGD) WITH PROPOFOL ;  Surgeon: Golda Claudis PENNER, MD;  Location: [facility];  Service: Endoscopy;  Laterality: N/A;   HOT HEMOSTASIS  N/A 05/03/2020   Procedure: HOT HEMOSTASIS (ARGON PLASMA COAGULATION/BICAP);  Surgeon: Rollin Dover, MD;  Location: [facility] ENDOSCOPY;  Service: Gastroenterology;  Laterality: N/A;   ICD IMPLANT N/A 09/04/2019   Procedure: ICD IMPLANT;  Surgeon: Waddell Danelle ORN, MD;  Location: [facility] INVASIVE CV LAB;  Service: Cardiovascular;  Laterality: N/A;   RIGHT/LEFT HEART CATH AND CORONARY ANGIOGRAPHY N/A 09/03/2019   Procedure: RIGHT/LEFT HEART  CATH AND CORONARY ANGIOGRAPHY;  Surgeon: Cherrie Toribio SAUNDERS, MD;  Location: [facility]VAS[facility] CV LAB;  Service: Cardiovascular;  Laterality: N/A;    Social History   Socioeconomic History   Marital status: Divorced    Spouse name: Not on file   Number of children: Not on file   Years of education: Not on file   Highest education level: Not on file  Occupational History   Not on file  Tobacco Use   Smoking status: Former    Current packs/day: 0.50    Average packs/day: 0.5 packs/day for 52.0 years (26.0 ttl pk-yrs)    Types: Cigarettes   Smokeless tobacco: Never  Vaping Use   Vaping status: Never Used  Substance and Sexual Activity   Alcohol use: No   Drug use: No   Sexual activity: Never  Other Topics Concern   Not on file  Social History Narrative   Not on file   Social Drivers of Health   Financial Resource Strain: Not on file  Food Insecurity: Not on file  Transportation Needs: Not on file  Physical Activity: Not on file  Stress: Not on file  Social Connections: Not on file  Intimate Partner Violence: Not on file     No Known Allergies   CBC    Component Value Date/Time   WBC 11.1 (H) 12/27/2023 0933   RBC 3.92 (L) 12/27/2023 0933   HGB 10.6 (L) 12/27/2023 0933   HGB 6.8 (LL) 04/29/2020 1059   HCT 33.8 (L) 12/27/2023 0933   HCT 19.8 (L) 04/29/2020 1059   PLT 372 12/27/2023 0933   PLT 193 04/29/2020 1059   MCV 86.2 12/27/2023 0933   MCV 90 04/29/2020 1059   MCH 27.0 12/27/2023 0933   MCHC 31.4 12/27/2023 0933   RDW 14.9 12/27/2023  0933   RDW 16.7 (H) 04/29/2020 1059   LYMPHSABS 1.0 12/27/2023 0933   MONOABS 1.3 (H) 12/27/2023 0933   EOSABS 0.2 12/27/2023 0933   BASOSABS 0.1 12/27/2023 0933    Pulmonary Functions Testing Results:     No data to display          Outpatient Medications Prior to Visit  Medication Sig Dispense Refill   aspirin  EC 81 MG tablet Take 81 mg by mouth daily. Swallow whole.     atorvastatin  (LIPITOR ) 80 MG tablet Take 80 mg by mouth daily.     carvedilol  (COREG ) 3.125 MG tablet Take 1 tablet (3.125 mg total) by mouth daily with breakfast. 30 tablet 0   [employer] GENTLE LAXATIVE 5 MG EC tablet TAKE 1 TABLET BY MOUTH DAILY AS NEEDED FOR MODERATE CONSTIPATION. TAKE TWO DAYS BEFORE PROCEDURE (Patient taking differently: daily as needed.) 30 tablet 0   cyanocobalamin  (,VITAMIN B-12,) 1000 MCG/ML injection Inject 1,000 mcg into the muscle every 30 (thirty) days.     cyanocobalamin  1000 MCG tablet Take 1,000 mcg by mouth daily.     fentaNYL  (DURAGESIC ) 25 MCG/HR Place 1 patch onto the skin every 3 (three) days for 15 days. 5 patch 0   ferrous sulfate  (FERROUSUL) 325 (65 FE) MG tablet Take 1 tablet (325 mg total) by mouth daily with breakfast.  0   furosemide  (LASIX ) 20 MG tablet Take 20 mg by mouth daily.     guaiFENesin  (MUCINEX ) 600 MG 12 hr tablet Take 600 mg by mouth 2 (two) times daily as needed for cough or to loosen phlegm.     losartan  (COZAAR ) 25 MG tablet Take 25 mg by mouth daily.  nitroGLYCERIN  (NITROSTAT ) 0.4 MG SL tablet Place 1 tablet (0.4 mg total) under the tongue every 5 (five) minutes as needed for chest pain. Patient must keep upcoming appointment for further refills 25 tablet 12   omeprazole  (PRILOSEC) 20 MG capsule Take 20 mg by mouth daily.     ondansetron  (ZOFRAN ) 8 MG tablet Take 1 tablet (8 mg total) by mouth every 8 (eight) hours as needed for nausea or vomiting. 20 tablet 0   potassium chloride  SA (KLOR-CON ) 20 MEQ tablet Take 20 mEq by mouth daily.     spironolactone   (ALDACTONE ) 50 MG tablet Take 50 mg by mouth daily.     apixaban  (ELIQUIS ) 5 MG TABS tablet Take 1 tablet (5 mg total) by mouth 2 (two) times daily. (Patient not taking: Reported on 01/01/2024) 60 tablet 5   oxyCODONE-acetaminophen  (PERCOCET/ROXICET) 5-325 MG tablet Take 1 tablet by mouth every 6 (six) hours as needed for severe pain (pain score 7-10). (Patient not taking: Reported on 01/01/2024) 6 tablet 0   No facility-administered medications prior to visit.

## 2024-01-01 NOTE — Patient Instructions (Signed)
  Katlyn NP has advised you present to [facility] -- across the street.

## 2024-01-01 NOTE — ED Provider Triage Note (Signed)
 Emergency Medicine Provider Triage Evaluation Note  Peter Lambert , a 78 y.o. male  was evaluated in triage.  Pt complains of chest pain and shortness of breath x 2 weeks. Sent over by cardiologist for chest pain and shortness of breath and abnormal ecg.   Masses found in lungs x 1 week ago scheduled for bronchoscopy and pet scan. Took nitroglycerin  and had improvement of pain, now saying he has no pain.   Endorses chronic cough  Denies fever, headaches, vision changes, congestion, abdominal pain, n/v/d, dysuria, LE swelling.   Review of Systems  Positive: N/a Negative: N/a  Physical Exam  BP 119/69 (BP Location: Right Arm)   Pulse 85   Temp 98.7 F (37.1 C) (Oral)   Resp 16   Ht 5' 8 (1.727 m)   Wt 66.2 kg   SpO2 94%   BMI 22.20 kg/m  Gen:   Awake, no distress   Resp:  Normal effort  MSK:   Moves extremities without difficulty  Other:    Medical Decision Making  Medically screening exam initiated at 5:23 PM.  Appropriate orders placed.  Peter Lambert was informed that the remainder of the evaluation will be completed by another provider, this initial triage assessment does not replace that evaluation, and the importance of remaining in the ED until their evaluation is complete.     Beola Terrall RAMAN, [place] 01/01/24 1728

## 2024-01-01 NOTE — ED Provider Notes (Signed)
 [place] EMERGENCY DEPARTMENT AT [facility] Provider Note   CSN: [id number] Arrival date & time: 01/01/24  1623     Patient presents with: Chest Pain and Atrial Fibrillation   Peter Lambert is a 78 y.o. male.  Patient with significant past medical history including type II DM, history of cardiac arrest with V-fib, chronic respiratory failure, lung mass, chronic A-fib on Eliquis , heart failure with reduced ejection fraction presents to the emergency department at the recommendation of cardiology for evaluation due to periodic chest pain which has been relieved by nitroglycerin .  Patient describes episodes that he believes are indigestion but he takes nitroglycerin  and seems to find relief after taking the nitroglycerin .  Patient was also seen recently by pulmonology with plans for workup due to recently discovered lung mass and osseous lytic lesions and had an episode of hypoxia requiring supplemental oxygen .  Currently patient is asymptomatic.  He denies any chest pain, shortness of breath, abdominal pain, nausea, vomiting.  He does endorse episodes of constipation and takes laxatives at baseline.  He states his last bowel movement was last week but states this is normal for him.  He denies nausea or vomiting.  Family at bedside states he was eating a bag of corn chips earlier this evening with no difficulty.  {Add pertinent medical, surgical, social history, OB history to HPI:32947}  Chest Pain Atrial Fibrillation Associated symptoms include chest pain.       Prior to Admission medications   Medication Sig Start Date End Date Taking? Authorizing Provider  apixaban  (ELIQUIS ) 5 MG TABS tablet Take 1 tablet (5 mg total) by mouth 2 (two) times daily. Patient not taking: Reported on 01/01/2024 05/01/23   Court Dorn PARAS, MD  aspirin  EC 81 MG tablet Take 81 mg by mouth daily. Swallow whole.    [provider]  atorvastatin  (LIPITOR ) 80 MG tablet Take 80 mg by mouth daily.     [provider]  carvedilol  (COREG ) 3.125 MG tablet Take 1 tablet (3.125 mg total) by mouth daily with breakfast. 05/04/20 01/01/24  Shona Terry SAILOR, DO  [employer] GENTLE LAXATIVE 5 MG EC tablet TAKE 1 TABLET BY MOUTH DAILY AS NEEDED FOR MODERATE CONSTIPATION. TAKE TWO DAYS BEFORE PROCEDURE Patient taking differently: Take 5 mg by mouth daily as needed for mild constipation or moderate constipation. 05/30/20   Castaneda Mayorga, Daniel, MD  cyanocobalamin  (,VITAMIN B-12,) 1000 MCG/ML injection Inject 1,000 mcg into the muscle every 30 (thirty) days.    [provider]  cyanocobalamin  1000 MCG tablet Take 1,000 mcg by mouth daily.    [provider]  fentaNYL  (DURAGESIC ) 25 MCG/HR Place 1 patch onto the skin every 3 (three) days for 15 days. 12/27/23 01/11/24  Geofm Delon BRAVO, NP  ferrous sulfate  (FERROUSUL) 325 (65 FE) MG tablet Take 1 tablet (325 mg total) by mouth daily with breakfast. 01/19/20   Rehman, Claudis PENNER, MD  furosemide  (LASIX ) 20 MG tablet Take 20 mg by mouth daily.    [provider]  guaiFENesin  (MUCINEX ) 600 MG 12 hr tablet Take 600 mg by mouth 2 (two) times daily as needed for cough or to loosen phlegm.    [provider]  losartan  (COZAAR ) 25 MG tablet Take 25 mg by mouth daily. 01/14/21   [provider]  nitroGLYCERIN  (NITROSTAT ) 0.4 MG SL tablet Place 1 tablet (0.4 mg total) under the tongue every 5 (five) minutes as needed for chest pain. Patient must keep upcoming appointment for further refills  07/20/21   Court Dorn PARAS, MD  omeprazole  (PRILOSEC) 20 MG capsule Take 20 mg by mouth daily.    [provider]  ondansetron  (ZOFRAN ) 8 MG tablet Take 1 tablet (8 mg total) by mouth every 8 (eight) hours as needed for nausea or vomiting. 12/27/23   Geofm Delon BRAVO, NP  potassium chloride  SA (KLOR-CON ) 20 MEQ tablet Take 20 mEq by mouth daily.    [provider]  spironolactone  (ALDACTONE ) 50 MG tablet Take 50 mg by  mouth daily. 02/11/21   [provider]  potassium chloride  (KLOR-CON ) 20 MEQ packet TAKE 1 TABLET BY MOUTH EVERY DAY 12/24/12 07/14/13  Court Dorn PARAS, MD    Allergies: Patient has no known allergies.    Review of Systems  Cardiovascular:  Positive for chest pain.    Updated Vital Signs BP (!) 145/88   Pulse (!) 116   Temp 98.7 F (37.1 C) (Oral)   Resp 16   Ht 5' 8 (1.727 m)   Wt 66.2 kg   SpO2 98%   BMI 22.20 kg/m   Physical Exam Vitals and nursing note reviewed.  Constitutional:      General: He is not in acute distress.    Appearance: He is well-developed.  HENT:     Head: Normocephalic and atraumatic.  Eyes:     Conjunctiva/sclera: Conjunctivae normal.  Cardiovascular:     Rate and Rhythm: Normal rate. Rhythm irregular.  Pulmonary:     Effort: Pulmonary effort is normal. No respiratory distress.     Breath sounds: Normal breath sounds.  Chest:     Chest wall: No tenderness.  Abdominal:     Palpations: Abdomen is soft.     Tenderness: There is no abdominal tenderness.     Comments: No tenderness to palpation  Musculoskeletal:        General: No swelling.     Cervical back: Neck supple.  Skin:    General: Skin is warm and dry.     Capillary Refill: Capillary refill takes less than 2 seconds.  Neurological:     Mental Status: He is alert.  Psychiatric:        Mood and Affect: Mood normal.     (all labs ordered are listed, but only abnormal results are displayed) Labs Reviewed  BASIC METABOLIC PANEL WITH GFR - Abnormal; Notable for the following components:      Result Value   Glucose, Bld 117 (*)    Creatinine, Ser 1.44 (*)    Calcium  11.0 (*)    GFR, Estimated 49 (*)    All other components within normal limits  CBC - Abnormal; Notable for the following components:   RBC 3.85 (*)    Hemoglobin 10.0 (*)    HCT 33.2 (*)    All other components within normal limits  BRAIN NATRIURETIC PEPTIDE - Abnormal; Notable for the following  components:   B Natriuretic Peptide 224.1 (*)    All other components within normal limits  TROPONIN I (HIGH SENSITIVITY)  TROPONIN I (HIGH SENSITIVITY)    EKG: None  Radiology: CT ABDOMEN PELVIS W CONTRAST Result Date: 01/01/2024 EXAM: CT ABDOMEN AND PELVIS WITH CONTRAST 01/01/2024 07:13:00 PM TECHNIQUE: CT of the abdomen and pelvis was performed with the administration of 50 mL of iohexol  (OMNIPAQUE ) 350 MG/ML injection. Multiplanar reformatted images are provided for review. Automated exposure control, iterative reconstruction, and/or weight-based adjustment of the mA/kV was utilized to reduce the radiation dose to as low as reasonably achievable.  COMPARISON: 12/22/2023 CLINICAL HISTORY: Bowel obstruction suspected. FINDINGS: LOWER CHEST: See chest CT report today. LIVER: The liver is unremarkable. GALLBLADDER AND BILE DUCTS: Gallbladder is unremarkable. No biliary ductal dilatation. SPLEEN: No acute abnormality. PANCREAS: No acute abnormality. ADRENAL GLANDS: No acute abnormality. KIDNEYS, URETERS AND BLADDER: No stones in the kidneys or ureters. No hydronephrosis. No perinephric or periureteral stranding. Urinary bladder is unremarkable. GI AND BOWEL: Gaseous distention of the bowel involves both large and small bowel; however, distal small bowel loops are decompressed. While I favor this most likely reflects ileus, it is difficult to completely exclude distal small bowel obstruction. Moderate stool burden throughout the colon. Normal appendix. PERITONEUM AND RETROPERITONEUM: No ascites. No free air. VASCULATURE: Aorta is normal in caliber. Changes of prior aortobifemoral bypass. LYMPH NODES: No lymphadenopathy. REPRODUCTIVE ORGANS: Prostate enlargement. 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 No focal soft tissue abnormality. IMPRESSION: 1. Gaseous distention of bowel involving both large and small bowel with  decompressed distal small bowel loops, most likely ileus; distal small bowel obstruction cannot be completely excluded. 2. 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 Electronically signed by: Franky Crease MD 01/01/2024 07:31 PM EST RP Workstation: [id number]   CT Angio Chest PE W and/or Wo Contrast Result Date: 01/01/2024 EXAM: CTA of the Chest without and with contrast for PE 01/01/2024 07:13:00 PM TECHNIQUE: CTA of the chest was performed without and with the administration of 50 mL of iohexol  (OMNIPAQUE ) 350 MG/ML injection. Multiplanar reformatted images are provided for review. MIP images are provided for review. Automated exposure control, iterative reconstruction, and/or weight based adjustment of the mA/kV was utilized to reduce the radiation dose to as low as reasonably achievable. COMPARISON: 12/22/2023 CLINICAL HISTORY: Pulmonary embolism (PE) suspected, high prob. FINDINGS: PULMONARY ARTERIES: Pulmonary arteries are adequately opacified for evaluation. No pulmonary embolism. Main pulmonary artery is normal in caliber. MEDIASTINUM: The heart and pericardium demonstrate no acute abnormality. There is no acute abnormality of the thoracic aorta. Right hilar and mediastinal adenopathy again noted, stable. LYMPH NODES: Right hilar and mediastinal adenopathy again noted, stable. No axillary lymphadenopathy. LUNGS AND PLEURA: Right upper lobe mass measures 3 x 3 cm on image 58. No focal consolidation or pulmonary edema. No pleural effusion or pneumothorax. UPPER ABDOMEN: Limited images of the upper abdomen are unremarkable. SOFT TISSUES AND BONES: Numerous lytic bony metastases throughout the ribs with soft tissue masses are unchanged. Stable lytic metastases throughout the thoracic spine. IMPRESSION: 1. No evidence of pulmonary embolism. 2. Stable right upper lobe mass with unchanged right hilar and mediastinal adenopathy and diffuse lytic osseous  metastases in the ribs and thoracic spine. Electronically signed by: Franky Crease MD 01/01/2024 07:26 PM EST RP Workstation: [id number]   DG Chest 2 View Result Date: 01/01/2024 EXAM: 2 VIEW(S) XRAY OF THE CHEST 01/01/2024 06:36:16 PM COMPARISON: None available. CLINICAL HISTORY: chest pain FINDINGS: LINES, TUBES AND DEVICES: Left chest pacemaker with leads overlying right atrium and right ventricle. LUNGS AND PLEURA: Right upper lung opacity. Right hilar asymmetric density. Ovoid pleural-based opacity at the right apex corresponding to rib lesion on CT. No pulmonary edema. No pleural effusion. No pneumothorax. HEART AND MEDIASTINUM: No acute abnormality of the cardiac an silhouettes. BONES AND SOFT TISSUES: Known right second and third rib lesions. IMPRESSION: 1. Right upper lung and hilar opacity corresponding to the mass recently demonstrated on CT. 2. Right second and third rib lesions corresponding to metastatic lesions  seen on CT Electronically signed by: Luke Bun MD 01/01/2024 06:56 PM EST RP Workstation: [id number]    {Document cardiac monitor, telemetry assessment procedure when appropriate:32947} Procedures   Medications Ordered in the ED  iohexol  (OMNIPAQUE ) 350 MG/ML injection 50 mL (50 mLs Intravenous Contrast Given 01/01/24 1915)      {Click here for ABCD2, HEART and other calculators REFRESH Note before signing:1}                              Medical Decision Making Amount and/or Complexity of Data Reviewed Labs: ordered. Radiology: ordered.   This patient presents to the ED for concern of chest pain, this involves an extensive number of treatment options, and is a complaint that carries with it a high risk of complications and morbidity.  The differential diagnosis includes ACS, pulmonary embolism, dissection, pneumonia, others   Co morbidities / Chronic conditions that complicate the patient evaluation  As noted in HPI   Additional history obtained:  Additional  history obtained from EMR External records from outside source obtained and reviewed including cardiology notes   Lab Tests:  I Ordered, and personally interpreted labs.  The pertinent results include: BNP 224.1, creatinine at baseline, initial troponin 13   Imaging Studies ordered:  I ordered imaging studies including CT abdomen pelvis with contrast, CT angio chest PE study, chest x-ray I independently visualized and interpreted imaging which showed  1. Gaseous distention of bowel involving both large and small bowel with  decompressed distal small bowel loops, most likely ileus; distal small bowel  obstruction cannot be completely excluded.  2. Numerous destructive lytic osseous metastases throughout the lumbar spine  and pelvis, unchanged.   1. No evidence of pulmonary embolism.  2. Stable right upper lobe mass with unchanged right hilar and mediastinal  adenopathy and diffuse lytic osseous metastases in the ribs and thoracic spine.   1. Right upper lung and hilar opacity corresponding to the mass recently  demonstrated on CT.  2. Right second and third rib lesions corresponding to metastatic lesions seen  on CT   I agree with the radiologist interpretation   Cardiac Monitoring: / EKG:  The patient was maintained on a cardiac monitor.  I personally viewed and interpreted the cardiac monitored which showed an underlying rhythm of: A-fib RVR   Problem List / ED Course / Critical interventions / Medication management  *** I ordered medication including ***   Reevaluation of the patient after these medicines showed that the patient *** I have reviewed the patients home medicines and have made adjustments as needed   Consultations Obtained:  I requested consultation with the cardiologist, Dr.Lin,  and discussed lab and imaging findings as well as pertinent plan - they recommend: ***   Social Determinants of Health:  Patient is a former smoker   Test / Admission -  Considered:  ***   {Document critical care time when appropriate  Document review of labs and clinical decision tools ie CHADS2VASC2, etc  Document your independent review of radiology images and any outside records  Document your discussion with family members, caretakers and with consultants  Document social determinants of health affecting pt's care  Document your decision making why or why not admission, treatments were needed:32947:::1}   Final diagnoses:  None    ED Discharge Orders     None

## 2024-01-01 NOTE — Telephone Encounter (Signed)
   Pre-operative Risk Assessment    Patient Name: Elba Dendinger Stills  DOB: [birth year] MRN: [id number]   Date of last office visit: 07/18/22 Date of next office visit: unknown   Request for Surgical Clearance    Procedure:  Bronchoscopy  Date of Surgery:  Clearance 01/07/24                                Surgeon:  Dr Malka Surgeon's Group or Practice Name:  [facility] Phone number:  [phone] Fax number:  [phone]   Type of Clearance Requested:   - Medical  - Pharmacy:  Hold Apixaban  (Eliquis )     Type of Anesthesia:  General    Additional requests/questions:    Bonney Hamilton Bergeron   01/01/2024, 10:22 AM

## 2024-01-01 NOTE — ED Notes (Signed)
 Attempted to get trop with no success. RN and Phlebotomy aware

## 2024-01-01 NOTE — Progress Notes (Signed)
 Patient here to see K. Devora, NP as a pre-op evaluation prior to bronchoscopy Overdue for in clinic ICD check, needed to provide device clearance for his upcoming bronchoscopy Battery and lead measurements are stable Presenting AFib/Vs (V rates 90's-110's mostly while here) Atrial lead impedance has been an a gradual decline though remains WNL Sensing stable, no A threshold done given he is in AFib AP 4% VP <1% No VT Overall AFib burden low 3%, burden appears increased/more of late Current episode in progress since 12/31/23 ~ 0816 Device described PAF yesterday + NSVT (not new) 4 episodes since May 2024, (longest 15 seconds) Heart logic score on the rise (22 currently)  K. Devora, NP, made aware of findings.  Peter Arthur, PA-C

## 2024-01-01 NOTE — Telephone Encounter (Signed)
 Pharmacy please advise on holding Eliquis  prior to Bronchoscopy scheduled for 01/07/2024. Last labs 12/27/2023. Thank you.

## 2024-01-01 NOTE — Telephone Encounter (Signed)
 Robotic Bronchoscopy with EBUS 01/07/2024 9:15 am R91.1 CPT Code: 68372, 31652, 31653 [place] please see Bronch info.

## 2024-01-02 ENCOUNTER — Inpatient Hospital Stay (HOSPITAL_COMMUNITY)

## 2024-01-02 ENCOUNTER — Encounter: Payer: Self-pay | Admitting: Internal Medicine

## 2024-01-02 DIAGNOSIS — I10 Essential (primary) hypertension: Secondary | ICD-10-CM | POA: Diagnosis not present

## 2024-01-02 DIAGNOSIS — R079 Chest pain, unspecified: Secondary | ICD-10-CM | POA: Diagnosis present

## 2024-01-02 DIAGNOSIS — K567 Ileus, unspecified: Secondary | ICD-10-CM | POA: Diagnosis not present

## 2024-01-02 DIAGNOSIS — I2081 Angina pectoris with coronary microvascular dysfunction: Secondary | ICD-10-CM

## 2024-01-02 DIAGNOSIS — Z716 Tobacco abuse counseling: Secondary | ICD-10-CM | POA: Diagnosis not present

## 2024-01-02 DIAGNOSIS — R918 Other nonspecific abnormal finding of lung field: Secondary | ICD-10-CM | POA: Diagnosis not present

## 2024-01-02 DIAGNOSIS — I482 Chronic atrial fibrillation, unspecified: Secondary | ICD-10-CM | POA: Diagnosis not present

## 2024-01-02 DIAGNOSIS — Z8042 Family history of malignant neoplasm of prostate: Secondary | ICD-10-CM | POA: Diagnosis not present

## 2024-01-02 DIAGNOSIS — I2510 Atherosclerotic heart disease of native coronary artery without angina pectoris: Secondary | ICD-10-CM | POA: Diagnosis not present

## 2024-01-02 DIAGNOSIS — Z95810 Presence of automatic (implantable) cardiac defibrillator: Secondary | ICD-10-CM | POA: Diagnosis not present

## 2024-01-02 DIAGNOSIS — R0789 Other chest pain: Secondary | ICD-10-CM | POA: Diagnosis not present

## 2024-01-02 DIAGNOSIS — I252 Old myocardial infarction: Secondary | ICD-10-CM | POA: Diagnosis not present

## 2024-01-02 DIAGNOSIS — Z79899 Other long term (current) drug therapy: Secondary | ICD-10-CM | POA: Diagnosis not present

## 2024-01-02 DIAGNOSIS — Z800 Family history of malignant neoplasm of digestive organs: Secondary | ICD-10-CM | POA: Diagnosis not present

## 2024-01-02 DIAGNOSIS — Z87891 Personal history of nicotine dependence: Secondary | ICD-10-CM | POA: Diagnosis not present

## 2024-01-02 DIAGNOSIS — Z8674 Personal history of sudden cardiac arrest: Secondary | ICD-10-CM | POA: Diagnosis not present

## 2024-01-02 DIAGNOSIS — E785 Hyperlipidemia, unspecified: Secondary | ICD-10-CM | POA: Diagnosis not present

## 2024-01-02 DIAGNOSIS — Z7982 Long term (current) use of aspirin: Secondary | ICD-10-CM | POA: Diagnosis not present

## 2024-01-02 DIAGNOSIS — Z955 Presence of coronary angioplasty implant and graft: Secondary | ICD-10-CM | POA: Diagnosis not present

## 2024-01-02 DIAGNOSIS — C7951 Secondary malignant neoplasm of bone: Secondary | ICD-10-CM | POA: Diagnosis not present

## 2024-01-02 DIAGNOSIS — D631 Anemia in chronic kidney disease: Secondary | ICD-10-CM | POA: Diagnosis not present

## 2024-01-02 DIAGNOSIS — E1151 Type 2 diabetes mellitus with diabetic peripheral angiopathy without gangrene: Secondary | ICD-10-CM | POA: Diagnosis not present

## 2024-01-02 DIAGNOSIS — N1832 Chronic kidney disease, stage 3b: Secondary | ICD-10-CM | POA: Diagnosis not present

## 2024-01-02 DIAGNOSIS — I5022 Chronic systolic (congestive) heart failure: Secondary | ICD-10-CM | POA: Diagnosis not present

## 2024-01-02 DIAGNOSIS — C3490 Malignant neoplasm of unspecified part of unspecified bronchus or lung: Secondary | ICD-10-CM | POA: Diagnosis not present

## 2024-01-02 DIAGNOSIS — E1122 Type 2 diabetes mellitus with diabetic chronic kidney disease: Secondary | ICD-10-CM | POA: Diagnosis not present

## 2024-01-02 DIAGNOSIS — Z7901 Long term (current) use of anticoagulants: Secondary | ICD-10-CM | POA: Diagnosis not present

## 2024-01-02 DIAGNOSIS — I4819 Other persistent atrial fibrillation: Secondary | ICD-10-CM | POA: Diagnosis not present

## 2024-01-02 DIAGNOSIS — I130 Hypertensive heart and chronic kidney disease with heart failure and stage 1 through stage 4 chronic kidney disease, or unspecified chronic kidney disease: Secondary | ICD-10-CM | POA: Diagnosis not present

## 2024-01-02 LAB — BASIC METABOLIC PANEL WITH GFR
Anion gap: 7 (ref 5–15)
BUN: 15 mg/dL (ref 8–23)
CO2: 27 mmol/L (ref 22–32)
Calcium: 10.2 mg/dL (ref 8.9–10.3)
Chloride: 101 mmol/L (ref 98–111)
Creatinine, Ser: 1.34 mg/dL — ABNORMAL HIGH (ref 0.61–1.24)
GFR, Estimated: 54 mL/min — ABNORMAL LOW (ref >60)
Glucose, Bld: 86 mg/dL (ref 70–99)
Potassium: 4.3 mmol/L (ref 3.5–5.1)
Sodium: 135 mmol/L (ref 135–145)

## 2024-01-02 LAB — CBC
HCT: 26.3 % — ABNORMAL LOW (ref 39.0–52.0)
Hemoglobin: 8.2 g/dL — ABNORMAL LOW (ref 13.0–17.0)
MCH: 26.1 pg (ref 26.0–34.0)
MCHC: 31.2 g/dL (ref 30.0–36.0)
MCV: 83.8 fL (ref 80.0–100.0)
Platelets: 291 K/uL (ref 150–400)
RBC: 3.14 MIL/uL — ABNORMAL LOW (ref 4.22–5.81)
RDW: 14.6 % (ref 11.5–15.5)
WBC: 9.5 K/uL (ref 4.0–10.5)
nRBC: 0 % (ref 0.0–0.2)

## 2024-01-02 LAB — ECHOCARDIOGRAM COMPLETE
AR max vel: 3.55 cm2
AV Area VTI: 4.13 cm2
AV Area mean vel: 3.3 cm2
AV Mean grad: 1 mmHg
AV Peak grad: 1.6 mmHg
Ao pk vel: 0.64 m/s
Area-P 1/2: 4.71 cm2
Calc EF: 62.8 %
Height: 68 in
MV VTI: 2.59 cm2
S' Lateral: 3.2 cm
Single Plane A2C EF: 62.3 %
Single Plane A4C EF: 65.8 %
Weight: 2336 [oz_av]

## 2024-01-02 LAB — APTT: aPTT: 42 s — ABNORMAL HIGH (ref 24–36)

## 2024-01-02 LAB — HEPARIN LEVEL (UNFRACTIONATED): Heparin Unfractionated: 0.12 [IU]/mL — ABNORMAL LOW (ref 0.30–0.70)

## 2024-01-02 MED ORDER — CARVEDILOL 3.125 MG PO TABS
3.1250 mg | ORAL_TABLET | Freq: Two times a day (BID) | ORAL | Status: DC
  Administered 2024-01-02 – 2024-01-03 (×3): 3.125 mg via ORAL
  Filled 2024-01-02 (×3): qty 1

## 2024-01-02 MED ORDER — SODIUM CHLORIDE 0.9 % IV SOLN: INTRAVENOUS | Status: DC

## 2024-01-02 MED ORDER — DOCUSATE SODIUM 283 MG RE ENEM
1.0000 | ENEMA | Freq: Once | RECTAL | Status: AC
  Administered 2024-01-02: 283 mg via RECTAL
  Filled 2024-01-02: qty 1

## 2024-01-02 MED ORDER — LOSARTAN POTASSIUM 25 MG PO TABS
25.0000 mg | ORAL_TABLET | Freq: Every day | ORAL | Status: DC
  Administered 2024-01-02 – 2024-01-03 (×2): 25 mg via ORAL
  Filled 2024-01-02 (×2): qty 1

## 2024-01-02 MED ORDER — DOCUSATE SODIUM 100 MG PO CAPS
100.0000 mg | ORAL_CAPSULE | Freq: Two times a day (BID) | ORAL | Status: DC
  Administered 2024-01-02 – 2024-01-03 (×2): 100 mg via ORAL
  Filled 2024-01-02 (×3): qty 1

## 2024-01-02 MED ORDER — HEPARIN BOLUS VIA INFUSION
2000.0000 [IU] | Freq: Once | INTRAVENOUS | Status: AC
  Administered 2024-01-02: 2000 [IU] via INTRAVENOUS
  Filled 2024-01-02: qty 2000

## 2024-01-02 MED ORDER — SODIUM CHLORIDE 0.9 % IV SOLN: Freq: Once | INTRAVENOUS | Status: AC

## 2024-01-02 MED ORDER — HEPARIN (PORCINE) 25000 UT/250ML-% IV SOLN
1100.0000 [IU]/h | INTRAVENOUS | Status: DC
  Administered 2024-01-02: 800 [IU]/h via INTRAVENOUS
  Filled 2024-01-02: qty 250

## 2024-01-02 MED ORDER — HEPARIN BOLUS VIA INFUSION
2000.0000 [IU] | Freq: Once | INTRAVENOUS | Status: AC
Start: 2024-01-02 — End: 2024-01-02
  Administered 2024-01-02: 2000 [IU] via INTRAVENOUS
  Filled 2024-01-02: qty 2000

## 2024-01-02 MED ORDER — SENNA 8.6 MG PO TABS
1.0000 | ORAL_TABLET | Freq: Every day | ORAL | Status: DC
  Administered 2024-01-02: 8.6 mg via ORAL
  Filled 2024-01-02: qty 1

## 2024-01-02 MED ORDER — ASPIRIN 81 MG PO CHEW: 81.0000 mg | CHEWABLE_TABLET | ORAL | Status: DC

## 2024-01-02 MED ORDER — ASPIRIN 81 MG PO TBEC
81.0000 mg | DELAYED_RELEASE_TABLET | Freq: Every day | ORAL | Status: DC
  Administered 2024-01-02 – 2024-01-03 (×2): 81 mg via ORAL
  Filled 2024-01-02 (×2): qty 1

## 2024-01-02 MED ORDER — ATORVASTATIN CALCIUM 80 MG PO TABS
80.0000 mg | ORAL_TABLET | Freq: Every day | ORAL | Status: DC
  Administered 2024-01-02 – 2024-01-03 (×2): 80 mg via ORAL
  Filled 2024-01-02 (×2): qty 1

## 2024-01-02 MED ORDER — APIXABAN 5 MG PO TABS
5.0000 mg | ORAL_TABLET | Freq: Two times a day (BID) | ORAL | Status: DC
  Filled 2024-01-02: qty 1

## 2024-01-02 MED ORDER — FUROSEMIDE 20 MG PO TABS
20.0000 mg | ORAL_TABLET | Freq: Every day | ORAL | Status: DC
  Administered 2024-01-02 – 2024-01-03 (×2): 20 mg via ORAL
  Filled 2024-01-02 (×2): qty 1

## 2024-01-02 MED ORDER — POLYETHYLENE GLYCOL 3350 17 G PO PACK: 17.0000 g | PACK | Freq: Every day | ORAL | Status: DC

## 2024-01-02 MED ORDER — SPIRONOLACTONE 25 MG PO TABS
50.0000 mg | ORAL_TABLET | Freq: Every day | ORAL | Status: DC
  Administered 2024-01-02 – 2024-01-03 (×2): 50 mg via ORAL
  Filled 2024-01-02 (×2): qty 2

## 2024-01-02 MED ORDER — ISOSORBIDE MONONITRATE ER 30 MG PO TB24
30.0000 mg | ORAL_TABLET | Freq: Every day | ORAL | Status: DC
  Administered 2024-01-02 – 2024-01-03 (×2): 30 mg via ORAL
  Filled 2024-01-02 (×2): qty 1

## 2024-01-02 MED ORDER — POLYETHYLENE GLYCOL 3350 17 G PO PACK
17.0000 g | PACK | Freq: Two times a day (BID) | ORAL | Status: DC
Start: 2024-01-02 — End: 2024-01-04
  Administered 2024-01-03: 17 g via ORAL
  Filled 2024-01-02 (×3): qty 1

## 2024-01-02 NOTE — Progress Notes (Signed)
 PERIOPERATIVE PRESCRIPTION FOR IMPLANTED CARDIAC DEVICE PROGRAMMING   Patient Information:  Patient: Peter Lambert  MRN: [id number]  Date of Birth: [birth year]    Procedure:  Bronchoscopy Date of Surgery:  Clearance 01/07/24                              Surgeon:  Dr Malka Surgeon's Group or Practice Name:  [facility] Phone number:  [phone] Fax number:  [phone]   Device Information:   Clinic EP Physician:   Dr. Danelle Birmingham Device Type:  Defibrillator Manufacturer and Phone #:  [employer]: [phone] Pacemaker Dependent?:  No Date of Last Device Check:  01/01/2024         Normal Device Function?:  Yes     Electrophysiologist's Recommendations:   Have magnet available. Provide continuous ECG monitoring when magnet is used or reprogramming is to be performed.  Procedure may interfere with device function.  Magnet should be placed over device during procedure.  Per Device Clinic Standing Orders, Almarie ONEIDA Shutter  01/02/2024 8:55 AM

## 2024-01-02 NOTE — Telephone Encounter (Signed)
 I will update the requesting office to see notes.

## 2024-01-02 NOTE — ED Notes (Signed)
 PT assisted to rest room.  PT stand by assist with cane.

## 2024-01-02 NOTE — Progress Notes (Signed)
 PHARMACY - ANTICOAGULATION CONSULT NOTE  Pharmacy Consult for IV heparin  Indication: atrial fibrillation, ACS  No Known Allergies  Patient Measurements: Height: 5' 7.99 (172.7 cm) Weight: 63.5 kg (139 lb 14.4 oz) IBW/kg (Calculated) : 68.38 HEPARIN  DW (KG): 63.5  Vital Signs: Temp: 98.4 F (36.9 C) (11/06 1500) Temp Source: Oral (11/06 1500) BP: 94/49 (11/06 1820) Pulse Rate: 84 (11/06 1820)  Labs: Recent Labs    01/01/24 1705 01/01/24 2306 01/02/24 1742  HGB 10.0*  --  8.2*  HCT 33.2*  --  26.3*  PLT 380  --  291  APTT  --   --  42*  HEPARINUNFRC  --   --  0.12*  CREATININE 1.44*  --  1.34*  TROPONINIHS 13 14  --     Estimated Creatinine Clearance: 39.5 mL/min (A) (by C-G formula based on SCr of 1.34 mg/dL (H)).   Medical History: Past Medical History:  Diagnosis Date   Acute upper GI bleed    Coronary artery disease    a. s/p NSTEMI in 2010 with DES to proximal LAD with D1 jailed and angioplasty alone to ostium   Hyperlipidemia    Hypertension    Hypertension    Peripheral arterial disease    Tobacco abuse    Type 2 diabetes mellitus (HCC)    Assessment: Peter Lambert is a 78 y.o. year old male admitted on 01/01/2024 with concern for chest pain. On eliquis  prior to admission for afib (last dose 11/2, pt reports stopped taking 4 days ago d/t bronchoscopy planned). Pharmacy consulted to dose heparin . Will trend aPTT until correlates with HL d/t eliquis  prior to admission.  PM: aPTT 42 sec, HL 0.12 (both subtherapeutic) on heparin  800 units/hr. No issues with the infusion or bleeding reported.    Goal of Therapy:  Heparin  level 0.3-0.7 units/ml aPTT 66-102 seconds Monitor platelets by anticoagulation protocol: Yes   Plan:  Repeat Heparin  2000 units x 1 as bolus and increase heparin  infusion to 950 units/hr 8h aPTT and HL Daily aPTT, heparin  level, CBC, and monitoring for bleeding F/u plans for anticoagulation and cath  Thank you for allowing  pharmacy to participate in this patient's care.  Rocky Slade, PharmD, BCPS 01/02/2024,7:48 PM

## 2024-01-02 NOTE — H&P (Addendum)
 History and Physical    [name] FMW:[id number] DOB: 05/30/1945 DOA: 01/01/2024  PCP: Bertell Satterfield, MD  Patient coming from: cardiology office  I have personally briefly reviewed patient's old medical records in [facility]  Chief Complaint: chest pain  HPI: Peter Lambert is a 78 y.o. male with medical history significant of CAD s/p DES to LAD in 2010, V-fib arrest in 2021 in setting of occluded RCA , hypertension, persistent atrial fibrillation on Eliquis , hyperlipidemia, tobacco use, PAD s/p remote aortobifemoral bypass grafting, who presents to ED sent in from cardiology office where he was being seen for expedited pre-op for bronchoscopy due to  lung mass.  Patient on ros was noted to have  Chest pain 2-3 times per week that was relieved by nitroglycerin . He states chest pain is substernal and feels like heart burn.  He notes no exacerbation factors but notes pain is relieved by nitroglycerin . Patent currently notes no chest pain , n/v/d/ diaphoresis, cough fever or chills. He does note back pain and hip pain however. Due patient significant cardiac history patient was sent to ED for further evaluation.  Per patient states no current chest pain and feels his chest pain am of presentation was different from his cardiac chest pain but could not explain why he took nitroglycerin . IN any event he states he feels well at this time. .    ED Course:  Vitals: afeb,  bp 119/69, hr 85, rr 16 sat 94%  Wbc 10.1 , hgb 10, plt 380,  Na 137, K 4.1 , Cl 100, cr 1.44 at baseline , calcium  11  Bn 224.1 CE13,14  CTPE IMPRESSION: 1. Right upper lung and hilar opacity corresponding to the mass recently demonstrated on CT. 2. Right second and third rib lesions corresponding to metastatic lesions seen on CT  CTAB IMPRESSION: 1. Gaseous distention of bowel involving both large and small bowel with decompressed distal small bowel loops, most likely ileus; distal small bowel obstruction  cannot be completely excluded. 2. 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 Review of Systems: As per HPI otherwise 10 point review of systems negative.   Past Medical History:  Diagnosis Date   Acute upper GI bleed    Coronary artery disease    a. s/p NSTEMI in 2010 with DES to proximal LAD with D1 jailed and angioplasty alone to ostium   Hyperlipidemia    Hypertension    Hypertension    Peripheral arterial disease    Tobacco abuse    Type 2 diabetes mellitus (HCC)     Past Surgical History:  Procedure Laterality Date   CARDIAC SURGERY     COLONOSCOPY WITH PROPOFOL  N/A 01/08/2020   Procedure: COLONOSCOPY WITH PROPOFOL ;  Surgeon: Cindie Carlin POUR, DO;  Location: [facility];  Service: Endoscopy;  Laterality: N/A;   COLONOSCOPY WITH PROPOFOL  N/A 05/03/2020   Hung:entire examined colon normal   CORONARY STENT INTERVENTION N/A 09/03/2019   Procedure: CORONARY STENT INTERVENTION;  Surgeon: Verlin Lonni BIRCH, MD;  Location: [facility] INVASIVE CV LAB;  Service: Cardiovascular;  Laterality: N/A;   CORONARY STENT PLACEMENT     ENTEROSCOPY N/A 05/03/2020   hung:normal esophagus, normal stomach. Three non-bleeding angiodysplastic lesions in the duodenum. Treated with a monopolar probe. A few non-bleeding angiodysplastic lesions in the jejunum. Treated with a monopolar probe. No specimens collected.   ESOPHAGOGASTRODUODENOSCOPY (EGD) WITH PROPOFOL  N/A 01/06/2020   Procedure: ESOPHAGOGASTRODUODENOSCOPY (EGD) WITH PROPOFOL ;  Surgeon: Golda Claudis PENNER, MD;  Location: [facility];  Service: Endoscopy;  Laterality: N/A;   HOT HEMOSTASIS N/A 05/03/2020   Procedure: HOT HEMOSTASIS (ARGON PLASMA COAGULATION/BICAP);  Surgeon: Rollin Dover, MD;  Location: [facility] ENDOSCOPY;  Service: Gastroenterology;  Laterality: N/A;   ICD IMPLANT N/A 09/04/2019   Procedure: ICD IMPLANT;  Surgeon: Waddell Danelle ORN, MD;  Location: [facility] INVASIVE CV LAB;  Service:  Cardiovascular;  Laterality: N/A;   RIGHT/LEFT HEART CATH AND CORONARY ANGIOGRAPHY N/A 09/03/2019   Procedure: RIGHT/LEFT HEART CATH AND CORONARY ANGIOGRAPHY;  Surgeon: Cherrie Toribio SAUNDERS, MD;  Location: [facility] INVASIVE CV LAB;  Service: Cardiovascular;  Laterality: N/A;     reports that he has quit smoking. His smoking use included cigarettes. He has a 26 pack-year smoking history. He has never used smokeless tobacco. He reports that he does not drink alcohol and does not use drugs.  No Known Allergies  Family History  Problem Relation Age of Onset   Colon cancer Mother        thinks it was colon cancer   Cancer Mother        not sure location    Prostate cancer Father    Dementia Sister    Colon cancer Brother    Stomach cancer Neg Hx     Prior to Admission medications   Medication Sig Start Date End Date Taking? Authorizing Provider  apixaban  (ELIQUIS ) 5 MG TABS tablet Take 1 tablet (5 mg total) by mouth 2 (two) times daily. Patient not taking: Reported on 01/01/2024 05/01/23   Court Dorn PARAS, MD  aspirin  EC 81 MG tablet Take 81 mg by mouth daily. Swallow whole.    [provider]  atorvastatin  (LIPITOR ) 80 MG tablet Take 80 mg by mouth daily.    [provider]  carvedilol  (COREG ) 3.125 MG tablet Take 1 tablet (3.125 mg total) by mouth daily with breakfast. 05/04/20 01/01/24  Shona Terry SAILOR, DO  [employer] GENTLE LAXATIVE 5 MG EC tablet TAKE 1 TABLET BY MOUTH DAILY AS NEEDED FOR MODERATE CONSTIPATION. TAKE TWO DAYS BEFORE PROCEDURE Patient taking differently: Take 5 mg by mouth daily as needed for mild constipation or moderate constipation. 05/30/20   Castaneda Mayorga, Daniel, MD  cyanocobalamin  (,VITAMIN B-12,) 1000 MCG/ML injection Inject 1,000 mcg into the muscle every 30 (thirty) days.    [provider]  cyanocobalamin  1000 MCG tablet Take 1,000 mcg by mouth daily.    [provider]  fentaNYL  (DURAGESIC ) 25 MCG/HR Place 1 patch onto the skin every 3  (three) days for 15 days. 12/27/23 01/11/24  Geofm Delon BRAVO, NP  ferrous sulfate  (FERROUSUL) 325 (65 FE) MG tablet Take 1 tablet (325 mg total) by mouth daily with breakfast. 01/19/20   Rehman, Claudis PENNER, MD  furosemide  (LASIX ) 20 MG tablet Take 20 mg by mouth daily.    [provider]  guaiFENesin  (MUCINEX ) 600 MG 12 hr tablet Take 600 mg by mouth 2 (two) times daily as needed for cough or to loosen phlegm.    [provider]  losartan  (COZAAR ) 25 MG tablet Take 25 mg by mouth daily. 01/14/21   [provider]  nitroGLYCERIN  (NITROSTAT ) 0.4 MG SL tablet Place 1 tablet (0.4 mg total) under the tongue every 5 (five) minutes as needed for chest pain. Patient must keep upcoming appointment for further refills 07/20/21   Court Dorn PARAS, MD  omeprazole  (PRILOSEC) 20 MG capsule Take 20 mg by mouth daily.    [provider]  ondansetron  (ZOFRAN ) 8 MG tablet  Take 1 tablet (8 mg total) by mouth every 8 (eight) hours as needed for nausea or vomiting. 12/27/23   Geofm Delon BRAVO, NP  potassium chloride  SA (KLOR-CON ) 20 MEQ tablet Take 20 mEq by mouth daily.    [provider]  spironolactone  (ALDACTONE ) 50 MG tablet Take 50 mg by mouth daily. 02/11/21   [provider]  potassium chloride  (KLOR-CON ) 20 MEQ packet TAKE 1 TABLET BY MOUTH EVERY DAY 12/24/12 07/14/13  Court Dorn PARAS, MD    Physical Exam: Vitals:   01/02/24 0118 01/02/24 0145 01/02/24 0200 01/02/24 0218  BP: (!) 129/56 135/73 (!) 141/75   Pulse: 92 83 85   Resp: 17     Temp: 98.7 F (37.1 C)   97.9 F (36.6 C)  TempSrc: Oral   Oral  SpO2: 92% 98% 95%   Weight:      Height:        Constitutional: NAD, calm, comfortable Vitals:   01/02/24 0118 01/02/24 0145 01/02/24 0200 01/02/24 0218  BP: (!) 129/56 135/73 (!) 141/75   Pulse: 92 83 85   Resp: 17     Temp: 98.7 F (37.1 C)   97.9 F (36.6 C)  TempSrc: Oral   Oral  SpO2: 92% 98% 95%   Weight:      Height:       Eyes:  PERRL, lids and conjunctivae normal ENMT: Mucous membranes are moist. Posterior pharynx clear of any exudate or lesions.Normal dentition.  Neck: normal, supple, no masses, no thyromegaly Respiratory: clear to auscultation bilaterally, no wheezing, no crackles. Normal respiratory effort. No accessory muscle use.  Cardiovascular: Regular rate and rhythm, no murmurs / rubs / gallops. No extremity edema. 2+ pedal pulses. Abdomen: no tenderness, no masses palpated. No hepatosplenomegaly. Bowel sounds positive.  Musculoskeletal: no clubbing / cyanosis. No joint deformity upper and lower extremities. Good ROM, no contractures. Normal muscle tone.  Skin: no rashes, lesions, ulcers. No induration Neurologic: CN 2-12 grossly intact. Sensation intact,. Strength 5/5 in all 4.  Psychiatric: Normal judgment and insight. Alert and oriented x 3. Normal mood.    Labs on Admission: I have personally reviewed following labs and imaging studies  CBC: Recent Labs  Lab 12/27/23 0933 01/01/24 1705  WBC 11.1* 10.1  NEUTROABS 8.5*  --   HGB 10.6* 10.0*  HCT 33.8* 33.2*  MCV 86.2 86.2  PLT 372 380   Basic Metabolic Panel: Recent Labs  Lab 12/27/23 0933 01/01/24 1705  NA 139 137  K 4.3 4.1  CL 100 100  CO2 24 24  GLUCOSE 120* 117*  BUN 35* 17  CREATININE 2.20* 1.44*  CALCIUM  10.5* 11.0*   GFR: Estimated Creatinine Clearance: 38.3 mL/min (A) (by C-G formula based on SCr of 1.44 mg/dL (H)). Liver Function Tests: Recent Labs  Lab 12/27/23 0933  AST 49*  ALT 30  ALKPHOS 117  BILITOT 0.4  PROT 8.0  ALBUMIN 3.8   No results for input(s): LIPASE, AMYLASE in the last 168 hours. No results for input(s): AMMONIA in the last 168 hours. Coagulation Profile: No results for input(s): INR, PROTIME in the last 168 hours. Cardiac Enzymes: No results for input(s): CKTOTAL, CKMB, CKMBINDEX, TROPONINI in the last 168 hours. BNP (last 3 results) No results for input(s): PROBNP in the  last 8760 hours. HbA1C: No results for input(s): HGBA1C in the last 72 hours. CBG: No results for input(s): GLUCAP in the last 168 hours. Lipid Profile: No results for input(s): CHOL, HDL, LDLCALC, TRIG, CHOLHDL,  LDLDIRECT in the last 72 hours. Thyroid  Function Tests: No results for input(s): TSH, T4TOTAL, FREET4, T3FREE, THYROIDAB in the last 72 hours. Anemia Panel: No results for input(s): VITAMINB12, FOLATE, FERRITIN, TIBC, IRON, RETICCTPCT in the last 72 hours. Urine analysis:    Component Value Date/Time   COLORURINE YELLOW 04/30/2020 0109   APPEARANCEUR CLEAR 04/30/2020 0109   LABSPEC 1.015 04/30/2020 0109   PHURINE 5.0 04/30/2020 0109   GLUCOSEU NEGATIVE 04/30/2020 0109   HGBUR NEGATIVE 04/30/2020 0109   BILIRUBINUR NEGATIVE 04/30/2020 0109   KETONESUR NEGATIVE 04/30/2020 0109   PROTEINUR NEGATIVE 04/30/2020 0109   NITRITE NEGATIVE 04/30/2020 0109   LEUKOCYTESUR SMALL (A) 04/30/2020 0109    Radiological Exams on Admission: CT ABDOMEN PELVIS W CONTRAST Result Date: 01/01/2024 EXAM: CT ABDOMEN AND PELVIS WITH CONTRAST 01/01/2024 07:13:00 PM TECHNIQUE: CT of the abdomen and pelvis was performed with the administration of 50 mL of iohexol  (OMNIPAQUE ) 350 MG/ML injection. Multiplanar reformatted images are provided for review. Automated exposure control, iterative reconstruction, and/or weight-based adjustment of the mA/kV was utilized to reduce the radiation dose to as low as reasonably achievable. COMPARISON: 12/22/2023 CLINICAL HISTORY: Bowel obstruction suspected. FINDINGS: LOWER CHEST: See chest CT report today. LIVER: The liver is unremarkable. GALLBLADDER AND BILE DUCTS: Gallbladder is unremarkable. No biliary ductal dilatation. SPLEEN: No acute abnormality. PANCREAS: No acute abnormality. ADRENAL GLANDS: No acute abnormality. KIDNEYS, URETERS AND BLADDER: No stones in the kidneys or ureters. No hydronephrosis. No perinephric or periureteral  stranding. Urinary bladder is unremarkable. GI AND BOWEL: Gaseous distention of the bowel involves both large and small bowel; however, distal small bowel loops are decompressed. While I favor this most likely reflects ileus, it is difficult to completely exclude distal small bowel obstruction. Moderate stool burden throughout the colon. Normal appendix. PERITONEUM AND RETROPERITONEUM: No ascites. No free air. VASCULATURE: Aorta is normal in caliber. Changes of prior aortobifemoral bypass. LYMPH NODES: No lymphadenopathy. REPRODUCTIVE ORGANS: Prostate enlargement. 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 No focal soft tissue abnormality. IMPRESSION: 1. Gaseous distention of bowel involving both large and small bowel with decompressed distal small bowel loops, most likely ileus; distal small bowel obstruction cannot be completely excluded. 2. 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 Electronically signed by: Franky Crease MD 01/01/2024 07:31 PM EST RP Workstation: [id number]   CT Angio Chest PE W and/or Wo Contrast Result Date: 01/01/2024 EXAM: CTA of the Chest without and with contrast for PE 01/01/2024 07:13:00 PM TECHNIQUE: CTA of the chest was performed without and with the administration of 50 mL of iohexol  (OMNIPAQUE ) 350 MG/ML injection. Multiplanar reformatted images are provided for review. MIP images are provided for review. Automated exposure control, iterative reconstruction, and/or weight based adjustment of the mA/kV was utilized to reduce the radiation dose to as low as reasonably achievable. COMPARISON: 12/22/2023 CLINICAL HISTORY: Pulmonary embolism (PE) suspected, high prob. FINDINGS: PULMONARY ARTERIES: Pulmonary arteries are adequately opacified for evaluation. No pulmonary embolism. Main pulmonary artery is normal in caliber. MEDIASTINUM: The heart and  pericardium demonstrate no acute abnormality. There is no acute abnormality of the thoracic aorta. Right hilar and mediastinal adenopathy again noted, stable. LYMPH NODES: Right hilar and mediastinal adenopathy again noted, stable. No axillary lymphadenopathy. LUNGS AND PLEURA: Right upper lobe mass measures 3 x 3 cm on image 58. No focal consolidation or pulmonary edema. No pleural effusion or pneumothorax. UPPER ABDOMEN: Limited images of the upper abdomen are  unremarkable. SOFT TISSUES AND BONES: Numerous lytic bony metastases throughout the ribs with soft tissue masses are unchanged. Stable lytic metastases throughout the thoracic spine. IMPRESSION: 1. No evidence of pulmonary embolism. 2. Stable right upper lobe mass with unchanged right hilar and mediastinal adenopathy and diffuse lytic osseous metastases in the ribs and thoracic spine. Electronically signed by: Franky Crease MD 01/01/2024 07:26 PM EST RP Workstation: [id number]   DG Chest 2 View Result Date: 01/01/2024 EXAM: 2 VIEW(S) XRAY OF THE CHEST 01/01/2024 06:36:16 PM COMPARISON: None available. CLINICAL HISTORY: chest pain FINDINGS: LINES, TUBES AND DEVICES: Left chest pacemaker with leads overlying right atrium and right ventricle. LUNGS AND PLEURA: Right upper lung opacity. Right hilar asymmetric density. Ovoid pleural-based opacity at the right apex corresponding to rib lesion on CT. No pulmonary edema. No pleural effusion. No pneumothorax. HEART AND MEDIASTINUM: No acute abnormality of the cardiac an silhouettes. BONES AND SOFT TISSUES: Known right second and third rib lesions. IMPRESSION: 1. Right upper lung and hilar opacity corresponding to the mass recently demonstrated on CT. 2. Right second and third rib lesions corresponding to metastatic lesions seen on CT Electronically signed by: Luke Bun MD 01/01/2024 06:56 PM EST RP Workstation: [id number]    EKG: Independently reviewed.   Assessment/Plan   Chest pain r/o cardiac origin   Hx of CAD s/p DES/V fib arrest in setting of occluded RCA -ischemic work up unrevealing  -with patient noting symptoms akin to heart burn and ct finding of ileus/obstipation , probable symptoms gi in origin.  - however due to severe CAD , per cards will admit for echo and further evaluation  and risk stratification for planned bronchoscopy.   -continue home lasix , coreg , losartan , spironolactone , atorvastatin    - continue home ASA 81mg  every day -cards f/u in am   Lung mass with mets  -has planned outpatient bronchoscopy  -consider pulmonary consult if patient is cleared from cardiology standpoint  Ileus /obstipation  - no n/v/d or abdominal pain /exam benign  - does mention constipation  -will give enema  -start bowel regimen  -clear liquid for now  - consider gi consult in am   CKDIIIb -at baseline   Hypertension -resume coreg ,losartan    Persistent atrial fibrillation  -continue coreg   -continue on Eliquis   Hyperlipidemia -continue statin   Tobacco -encourage cessation    PAD s/p remote aortobifemoral bypass grafting -continue on asa/eliquis      DVT prophylaxis: Eliquis   Code Status: full/ as discussed per patient wishes in event of cardiac arrest  Family Communication: none at bedside Disposition Plan: patient  expected to be admitted greater than 2 midnights  Consults called: Cardiology Admission status: progressive care    Camila DELENA Ned MD [facility]  If 7PM-7AM, please contact night-coverage [email or web address] Password [email or web address]  01/02/2024, 4:45 AM

## 2024-01-02 NOTE — Telephone Encounter (Signed)
 Note patient admitted to hospital on 01/01/24.  Peter Lambert consulted and has seen patient at hospital - see her notes.

## 2024-01-02 NOTE — Consult Note (Incomplete)
 Cardiology Consultation   Patient ID: Adis Sturgill Brandt MRN: [id number]; DOB: 1945/09/24  Admit date: 01/01/2024 Date of Consult: 01/01/2024  PCP:  Bertell Satterfield, MD   [facility] Providers Cardiologist:  Dorn Lesches, MD   { Click here to update MD or APP on Care Team, Refresh:1}    Patient Profile: Peter Lambert is a 78 y.o. male with visit-pertinent history of CAD s/p DES to LAD in 2010, V-fib arrest in 2021 with LHC indicating occluded RCA and second marginal branch unable to be opened, EF at time of V-fib arrest was 20%, on LV gram on LHC had improved to 55%, hypertension, persistent atrial fibrillation on Eliquis , hyperlipidemia, tobacco use, PAD s/p remote aortobifemoral bypass grafting, who is being seen 01/01/2024 for the evaluation of chest pain at the request of Dr. Francesca.  History of Present Illness: Per outpatient cardiology note today, Patient was an acute add on visit for urgent preoperative cardiac evaluation for bronchoscopy on 01/07/2024 with [place] [facility].  Presents today with his niece. Patient reports that he has been having episodes of chest pain, requires taking of nitroglycerin  every 2 to 3 weeks with improvement in symptoms, patient is unsure of how long this has been ongoing.  He is unable to tell me if this is associated with exertion, notes dyspnea on exertion that has been worsening in recent weeks.  Patient notes that chest discomfort is in the center of his chest, thought it might be related to indigestion however notes that is relieved with nitroglycerin , he is unable to say how long episodes last for.  He denies any increased lower extremity edema, orthopnea or PND, notes weight loss in recent weeks.  Patient also notes that he has been having significant pain in his hip and back.  Discussed with Dr. Lesches, patients primary cardiologist who recommended that he present to the emergency department given recurrent chest pain relieved with  nitroglycerin  and atrial fibrillation.  Patient will need workup per Dr. Lesches prior to providing clearance for bronchoscopy.   Sent to the ED for more expedited workup. He is currently asymptomatic. His retelling of his chest pain seems to change from his outpatient visit. When he describes his chest pain episode to me now, he describes the episode as a right sided chest pain that occurred after popping a peppermint, but did seem to improve with nitroglycerin  yesterday. He notes that the last time he had to take nitroglycerin  prior to yesterday's event was in July. He isn't very active at home, <4 METS of activity. Is able to walk around with his cane without chest pain or SOB though. Currently feels asymptomatic.  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 PET CT scheduled for 01/03/24 and bronchoscopy with biopsy is planned for 01/07/24. Plan was to hold eliquis  for 3 days prior to his procedure (start holding 01/04/24).  Past Medical History:  Diagnosis Date  . Acute upper GI bleed   . Coronary artery disease    a. s/p NSTEMI in 2010 with DES to proximal LAD with D1 jailed and angioplasty alone to ostium  . Hyperlipidemia   . Hypertension   . Hypertension   . Peripheral arterial disease   . Tobacco abuse   . Type 2 diabetes mellitus (HCC)     Past Surgical History:  Procedure Laterality Date  . CARDIAC SURGERY    . COLONOSCOPY WITH PROPOFOL  N/A 01/08/2020   Procedure: COLONOSCOPY WITH PROPOFOL ;  Surgeon: Cindie Carlin POUR, DO;  Location: [facility];  Service: Endoscopy;  Laterality: N/A;  . COLONOSCOPY WITH PROPOFOL  N/A 05/03/2020   Hung:entire examined colon normal  . CORONARY STENT INTERVENTION N/A 09/03/2019   Procedure: CORONARY STENT INTERVENTION;  Surgeon: Verlin Lonni BIRCH, MD;  Location: [facility] INVASIVE CV LAB;  Service: Cardiovascular;  Laterality: N/A;  . CORONARY STENT PLACEMENT    . ENTEROSCOPY N/A  05/03/2020   hung:normal esophagus, normal stomach. Three non-bleeding angiodysplastic lesions in the duodenum. Treated with a monopolar probe. A few non-bleeding angiodysplastic lesions in the jejunum. Treated with a monopolar probe. No specimens collected.  . ESOPHAGOGASTRODUODENOSCOPY (EGD) WITH PROPOFOL  N/A 01/06/2020   Procedure: ESOPHAGOGASTRODUODENOSCOPY (EGD) WITH PROPOFOL ;  Surgeon: Golda Claudis PENNER, MD;  Location: [facility];  Service: Endoscopy;  Laterality: N/A;  . HOT HEMOSTASIS N/A 05/03/2020   Procedure: HOT HEMOSTASIS (ARGON PLASMA COAGULATION/BICAP);  Surgeon: Rollin Dover, MD;  Location: [facility] ENDOSCOPY;  Service: Gastroenterology;  Laterality: N/A;  . ICD IMPLANT N/A 09/04/2019   Procedure: ICD IMPLANT;  Surgeon: Waddell Danelle ORN, MD;  Location: [facility] INVASIVE CV LAB;  Service: Cardiovascular;  Laterality: N/A;  . RIGHT/LEFT HEART CATH AND CORONARY ANGIOGRAPHY N/A 09/03/2019   Procedure: RIGHT/LEFT HEART CATH AND CORONARY ANGIOGRAPHY;  Surgeon: Cherrie Toribio SAUNDERS, MD;  Location: [facility] INVASIVE CV LAB;  Service: Cardiovascular;  Laterality: N/A;     Home Medications:  Prior to Admission medications   Medication Sig Start Date End Date Taking? Authorizing Provider  apixaban  (ELIQUIS ) 5 MG TABS tablet Take 1 tablet (5 mg total) by mouth 2 (two) times daily. Patient not taking: Reported on 01/01/2024 05/01/23   Court Dorn PARAS, MD  aspirin  EC 81 MG tablet Take 81 mg by mouth daily. Swallow whole.    [provider]  atorvastatin  (LIPITOR ) 80 MG tablet Take 80 mg by mouth daily.    [provider]  carvedilol  (COREG ) 3.125 MG tablet Take 1 tablet (3.125 mg total) by mouth daily with breakfast. 05/04/20 01/01/24  Shona Terry SAILOR, DO  [employer] GENTLE LAXATIVE 5 MG EC tablet TAKE 1 TABLET BY MOUTH DAILY AS NEEDED FOR MODERATE CONSTIPATION. TAKE TWO DAYS BEFORE PROCEDURE Patient taking differently: Take 5 mg by mouth daily as needed for mild constipation or moderate constipation.  05/30/20   Castaneda Mayorga, Daniel, MD  cyanocobalamin  (,VITAMIN B-12,) 1000 MCG/ML injection Inject 1,000 mcg into the muscle every 30 (thirty) days.    [provider]  cyanocobalamin  1000 MCG tablet Take 1,000 mcg by mouth daily.    [provider]  fentaNYL  (DURAGESIC ) 25 MCG/HR Place 1 patch onto the skin every 3 (three) days for 15 days. 12/27/23 01/11/24  Geofm Delon BRAVO, NP  ferrous sulfate  (FERROUSUL) 325 (65 FE) MG tablet Take 1 tablet (325 mg total) by mouth daily with breakfast. 01/19/20   Rehman, Claudis PENNER, MD  furosemide  (LASIX ) 20 MG tablet Take 20 mg by mouth daily.    [provider]  guaiFENesin  (MUCINEX ) 600 MG 12 hr tablet Take 600 mg by mouth 2 (two) times daily as needed for cough or to loosen phlegm.    [provider]  losartan  (COZAAR ) 25 MG tablet Take 25 mg by mouth daily. 01/14/21   [provider]  nitroGLYCERIN  (NITROSTAT ) 0.4 MG SL tablet Place 1 tablet (0.4 mg total) under the tongue every 5 (five) minutes as needed for chest pain. Patient must keep upcoming appointment for further refills 07/20/21   Court Dorn PARAS, MD  omeprazole  (  PRILOSEC) 20 MG capsule Take 20 mg by mouth daily.    [provider]  ondansetron  (ZOFRAN ) 8 MG tablet Take 1 tablet (8 mg total) by mouth every 8 (eight) hours as needed for nausea or vomiting. 12/27/23   Geofm Delon BRAVO, NP  potassium chloride  SA (KLOR-CON ) 20 MEQ tablet Take 20 mEq by mouth daily.    [provider]  spironolactone  (ALDACTONE ) 50 MG tablet Take 50 mg by mouth daily. 02/11/21   [provider]  potassium chloride  (KLOR-CON ) 20 MEQ packet TAKE 1 TABLET BY MOUTH EVERY DAY 12/24/12 07/14/13  Court Dorn PARAS, MD    Scheduled Meds:  Continuous Infusions:  PRN Meds:   Allergies:   No Known Allergies  Social History:   Social History   Socioeconomic History  . Marital status: Divorced    Spouse name: Not on file  . Number of children:  Not on file  . Years of education: Not on file  . Highest education level: Not on file  Occupational History  . Not on file  Tobacco Use  . Smoking status: Former    Current packs/day: 0.50    Average packs/day: 0.5 packs/day for 52.0 years (26.0 ttl pk-yrs)    Types: Cigarettes  . Smokeless tobacco: Never  Vaping Use  . Vaping status: Never Used  Substance and Sexual Activity  . Alcohol use: No  . Drug use: No  . Sexual activity: Never  Other Topics Concern  . Not on file  Social History Narrative  . Not on file   Social Drivers of Health   Financial Resource Strain: Not on file  Food Insecurity: Not on file  Transportation Needs: Not on file  Physical Activity: Not on file  Stress: Not on file  Social Connections: Not on file  Intimate Partner Violence: Not on file    Family History:   Family History  Problem Relation Age of Onset  . Colon cancer Mother        thinks it was colon cancer  . Cancer Mother        not sure location   . Prostate cancer Father   . Dementia Sister   . Colon cancer Brother   . Stomach cancer Neg Hx      ROS:  Please see the history of present illness.  All other ROS reviewed and negative.     Physical Exam/Data: Vitals:   01/01/24 1644 01/01/24 1656 01/01/24 2158  BP: 119/69  (!) 145/88  Pulse: 85  (!) 116  Resp: 16  16  Temp: 98.7 F (37.1 C)    TempSrc: Oral    SpO2: 94%  98%  Weight:  66.2 kg   Height:  5' 8 (1.727 m)    No intake or output data in the 24 hours ending 01/01/24 2311    01/01/2024    4:56 PM 01/01/2024    3:08 PM 01/01/2024    9:31 AM  Last 3 Weights  Weight (lbs) 146 lb 146 lb 6.4 oz 145 lb  Weight (kg) 66.225 kg 66.407 kg 65.772 kg     Body mass index is 22.2 kg/m.  General:  Well nourished, well developed, in no acute distress*** HEENT: normal Neck: no JVD Vascular: No carotid bruits; Distal pulses 2+ bilaterally Cardiac:  normal S1, S2; RRR; no murmur *** Lungs:  clear to auscultation  bilaterally, no wheezing, rhonchi or rales  Abd: soft, nontender, no hepatomegaly  Ext: no edema Musculoskeletal:  No deformities, BUE  and BLE strength normal and equal Skin: warm and dry  Neuro:  CNs 2-12 intact, no focal abnormalities noted Psych:  Normal affect   EKG:  The EKG was personally reviewed and demonstrates:  *** Telemetry:  Telemetry was personally reviewed and demonstrates:  ***  Relevant CV Studies: LHC 09/03/19: Ost RCA to Prox RCA lesion is 100% stenosed. Previously placed Prox LAD to Mid LAD stent (unknown type) is widely patent. 1st Diag lesion is 90% stenosed. Ost LAD lesion is 30% stenosed. Prox Cx to Mid Cx lesion is 50% stenosed. Ost Cx to Prox Cx lesion is 40% stenosed. 2nd Mrg lesion is 99% stenosed. Findings:   Ao = 116/58 (79) LV = 118/10 RA = 10 RV = 43/8  PA = 45/19 (26) PCW = 14 Fick cardiac output/index = 5.5/2.9 PVR = 2.2 Ao sat = 99% PA sat = 65%, 65%   Assessment: 1.Significant CAD with left dominant system 2. Separate ostia for LAD & LCx 3. LAD with widely patent proximal sten. Mild non-obstructive CAD 4. LCX large dominant vessel with moderate diffuse disease. Culprit lesion appears to be dissected OM-2 branch with 99% lesion 5. RCA small non-dominant that is totally occluded 6. LVEF recovered at 55% 7. Well-compensated hemodynamics.  TTE 08/30/19: 1. Left ventricular ejection fraction, by estimation, is 20%. The left  ventricle has severely decreased function. The left ventricle demonstrates  global hypokinesis, more prominent inferior/posterior walls. There is  moderate left ventricular  hypertrophy. Left ventricular diastolic parameters are consistent with  Grade II diastolic dysfunction (pseudonormalization). Slow flow and stasis  noted by Definity  contrast in LV particularly at apex. Although no formed  thrombus, there is significant  substrate for thrombus formation.   2. RV-RA gradient 25 mmHg. Right ventricular systolic  function is  severely reduced. The right ventricular size is normal.   3. The mitral valve is grossly normal, mildly thickened. Mild mitral  valve regurgitation.   4. The aortic valve is tricuspid. Aortic valve regurgitation is not  visualized. Mild aortic valve sclerosis is present, with no evidence of  aortic valve stenosis.   5. Unable to estimate CVP.   Laboratory Data: High Sensitivity Troponin:   Recent Labs  Lab 01/01/24 1705  TROPONINIHS 13     Chemistry Recent Labs  Lab 12/27/23 0933 01/01/24 1705  NA 139 137  K 4.3 4.1  CL 100 100  CO2 24 24  GLUCOSE 120* 117*  BUN 35* 17  CREATININE 2.20* 1.44*  CALCIUM  10.5* 11.0*  GFRNONAA 30* 49*  ANIONGAP 15 13    Recent Labs  Lab 12/27/23 0933  PROT 8.0  ALBUMIN 3.8  AST 49*  ALT 30  ALKPHOS 117  BILITOT 0.4   Lipids No results for input(s): CHOL, TRIG, HDL, LABVLDL, LDLCALC, CHOLHDL in the last 168 hours.  Hematology Recent Labs  Lab 12/27/23 0933 01/01/24 1705  WBC 11.1* 10.1  RBC 3.92* 3.85*  HGB 10.6* 10.0*  HCT 33.8* 33.2*  MCV 86.2 86.2  MCH 27.0 26.0  MCHC 31.4 30.1  RDW 14.9 14.6  PLT 372 380   Thyroid  No results for input(s): TSH, FREET4 in the last 168 hours.  BNP Recent Labs  Lab 01/01/24 1705  BNP 224.1*    DDimer No results for input(s): DDIMER in the last 168 hours.  Radiology/Studies:  CT ABDOMEN PELVIS W CONTRAST Result Date: 01/01/2024 EXAM: CT ABDOMEN AND PELVIS WITH CONTRAST 01/01/2024 07:13:00 PM TECHNIQUE: CT of the abdomen and pelvis was performed with the  administration of 50 mL of iohexol  (OMNIPAQUE ) 350 MG/ML injection. Multiplanar reformatted images are provided for review. Automated exposure control, iterative reconstruction, and/or weight-based adjustment of the mA/kV was utilized to reduce the radiation dose to as low as reasonably achievable. COMPARISON: 12/22/2023 CLINICAL HISTORY: Bowel obstruction suspected. FINDINGS: LOWER CHEST: See chest CT  report today. LIVER: The liver is unremarkable. GALLBLADDER AND BILE DUCTS: Gallbladder is unremarkable. No biliary ductal dilatation. SPLEEN: No acute abnormality. PANCREAS: No acute abnormality. ADRENAL GLANDS: No acute abnormality. KIDNEYS, URETERS AND BLADDER: No stones in the kidneys or ureters. No hydronephrosis. No perinephric or periureteral stranding. Urinary bladder is unremarkable. GI AND BOWEL: Gaseous distention of the bowel involves both large and small bowel; however, distal small bowel loops are decompressed. While I favor this most likely reflects ileus, it is difficult to completely exclude distal small bowel obstruction. Moderate stool burden throughout the colon. Normal appendix. PERITONEUM AND RETROPERITONEUM: No ascites. No free air. VASCULATURE: Aorta is normal in caliber. Changes of prior aortobifemoral bypass. LYMPH NODES: No lymphadenopathy. REPRODUCTIVE ORGANS: Prostate enlargement. 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 No focal soft tissue abnormality. IMPRESSION: 1. Gaseous distention of bowel involving both large and small bowel with decompressed distal small bowel loops, most likely ileus; distal small bowel obstruction cannot be completely excluded. 2. 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 Electronically signed by: Franky Crease MD 01/01/2024 07:31 PM EST RP Workstation: [id number]   CT Angio Chest PE W and/or Wo Contrast Result Date: 01/01/2024 EXAM: CTA of the Chest without and with contrast for PE 01/01/2024 07:13:00 PM TECHNIQUE: CTA of the chest was performed without and with the administration of 50 mL of iohexol  (OMNIPAQUE ) 350 MG/ML injection. Multiplanar reformatted images are provided for review. MIP images are provided for review. Automated exposure control, iterative reconstruction, and/or weight based adjustment of the mA/kV was  utilized to reduce the radiation dose to as low as reasonably achievable. COMPARISON: 12/22/2023 CLINICAL HISTORY: Pulmonary embolism (PE) suspected, high prob. FINDINGS: PULMONARY ARTERIES: Pulmonary arteries are adequately opacified for evaluation. No pulmonary embolism. Main pulmonary artery is normal in caliber. MEDIASTINUM: The heart and pericardium demonstrate no acute abnormality. There is no acute abnormality of the thoracic aorta. Right hilar and mediastinal adenopathy again noted, stable. LYMPH NODES: Right hilar and mediastinal adenopathy again noted, stable. No axillary lymphadenopathy. LUNGS AND PLEURA: Right upper lobe mass measures 3 x 3 cm on image 58. No focal consolidation or pulmonary edema. No pleural effusion or pneumothorax. UPPER ABDOMEN: Limited images of the upper abdomen are unremarkable. SOFT TISSUES AND BONES: Numerous lytic bony metastases throughout the ribs with soft tissue masses are unchanged. Stable lytic metastases throughout the thoracic spine. IMPRESSION: 1. No evidence of pulmonary embolism. 2. Stable right upper lobe mass with unchanged right hilar and mediastinal adenopathy and diffuse lytic osseous metastases in the ribs and thoracic spine. Electronically signed by: Franky Crease MD 01/01/2024 07:26 PM EST RP Workstation: [id number]   DG Chest 2 View Result Date: 01/01/2024 EXAM: 2 VIEW(S) XRAY OF THE CHEST 01/01/2024 06:36:16 PM COMPARISON: None available. CLINICAL HISTORY: chest pain FINDINGS: LINES, TUBES AND DEVICES: Left chest pacemaker with leads overlying right atrium and right ventricle. LUNGS AND PLEURA: Right upper lung opacity. Right hilar asymmetric density. Ovoid pleural-based opacity at the right apex corresponding to rib lesion on CT. No pulmonary edema. No pleural effusion. No pneumothorax. HEART AND MEDIASTINUM: No acute abnormality of the  cardiac an silhouettes. BONES AND SOFT TISSUES: Known right second and third rib lesions. IMPRESSION: 1. Right upper  lung and hilar opacity corresponding to the mass recently demonstrated on CT. 2. Right second and third rib lesions corresponding to metastatic lesions seen on CT Electronically signed by: Luke Bun MD 01/01/2024 06:56 PM EST RP Workstation: [id number]     Assessment and Plan: 78 y.o. male with visit-pertinent history of CAD s/p DES to LAD in 2010, V-fib arrest in 2021 with LHC indicating occluded RCA and second marginal branch unable to be opened, EF at time of V-fib arrest was 20%, on LV gram on LHC had improved to 55%, hypertension, persistent atrial fibrillation on Eliquis , hyperlipidemia, tobacco use, PAD s/p remote aortobifemoral bypass grafting, who is being seen 01/01/2024 for the evaluation of chest pain at the request of Dr. Francesca.  #Chest pain, ?stable angina #Expedited pre-op CV assessment #Multivessel CAD s/p DES to LAD in 2010   Risk Assessment/Risk Scores: {Complete the following score calculators/questions to meet required metrics.  Press F2         :[id number]}   {Is the patient being seen for unstable angina, ACS, NSTEMI or STEMI?:[phone]} {Does this patient have CHF or CHF symptoms?      :[id number]} {Does this patient have ATRIAL FIBRILLATION?[phone]}  {Are we signing off today?:[id number]}  For questions or updates, please contact [facility] Please consult [email or web address] for contact info under    {TIP  Split Shared Billing  Do NOT delete any part of this including brackets If split shared billing is based upon MDM, disregard If billing will be based upon TIME you MUST document the number of minutes and a detailed list of what was done in that time in the following format Example - I spent ** minutes seeing this patient. During that time I reviewed their history, evaluated their symptoms, reviewed available labs, EKGs, studies, performed an exam and formulated an assessment and plan   :1} {Select this only if you need to document critical care time  (Optional):[id number]} Signed, Curtistine LITTIE Farr, MD  01/01/2024 11:11 PM

## 2024-01-02 NOTE — Telephone Encounter (Signed)
 For the codes 68372, [id number], [id number] Auth # [id number] valid 01/07/24 to 04/06/2024

## 2024-01-02 NOTE — Progress Notes (Signed)
 [facility] PROGRESS NOTE    Progress Note  Peter Lambert  FMW:[id number] DOB: 06/26/1945 DOA: 01/01/2024 PCP: Bertell Satterfield, MD     Brief Narrative:   Peter Lambert is an 78 y.o. male past medical history of CAD with a history of EF DES DES to the LAD in 2010, history of V-fib in 2021 with a left heart cath showing RCA obstruction, a 2D echo done postarrest showed improved EF to 55% Essential hypertension, persistent atrial fibrillation on Eliquis  tobacco abuse PAD with remote history of aortofemoral bypass grafting seen in the cardiology's office on 01/01/2024 for preoperative cardiovascular evaluation for bronchoscopy and related started having episodes of chest pain 2-3 times a week which relieved by nitroglycerin .  Pain is substernal no exacerbating factors no radiation.   Assessment/Plan:   Typical Chest pain/  Coronary artery disease s/p DES to LAD in 2010, s/p PCI to proximal LAD stenosis with DES: Has poor functional capacity less than 4 metabolic equivalents. Cardiology was consulted recommended a 2D echo and stress test for ischemic evaluation. Will need a Myoview as patient is not able to ambulate. Continue Coreg  losartan  Aldactone  and statins. Resume statins.  Chronic atrial fibrillation: Holding apixaban  in anticipation for bronchoscopy. Continue Coreg   Lung mass with metastases: Will need to consult pulmonary for bronchoscopy.  Obstipation: CT scan of the abdomen pelvis showed gaseous distention of the bowel involving both large and small intestine with decompressed distal small bowel. Patient denies nausea vomiting diarrhea or abdominal pain, physical exam is benign. Will start him on a bowel regimen. Give him an enema.  Essential hypertension: Continue Coreg  losartan  and Aldactone .  Hyperlipidemia:  Sign continue statins.  Tobacco abuse: He has been counseled.  CKD (chronic kidney disease) stage IIIb: With a baseline creatinine of  1.4-2.2. Appears to be at baseline.  DVT prophylaxis: Lovenox  Family Communication:daughter Status is: Inpatient Remains inpatient appropriate because: Atypical chest pain    Code Status:  Code Status History     Date Active Date Inactive Code Status Order ID Comments User Context   04/30/2020 0107 05/03/2020 2328 Full Code [id number]  Alfornia Madison, MD ED   01/04/2020 2318 01/08/2020 2203 Full Code [id number]  Manfred Driver, DO ED   08/29/2019 2317 09/05/2019 2141 Full Code [id number]  Gleason, Leita SAUNDERS, PA-C ED   04/23/2019 2326 04/28/2019 1955 Full Code [id number]  Margart Artemus HERO, MD ED   04/20/2019 1206 04/23/2019 1953 Full Code [id number]  Ricky Fines, MD ED   07/02/2016 1735 07/03/2016 2058 Full Code [id number]  Theophilus Andrews, Tully GRADE, MD Inpatient      Advance Directive Documentation    Flowsheet Row Most Recent Value  Type of Advance Directive Healthcare Power of Attorney, Living will  Pre-existing out of facility DNR order (yellow form or pink MOST form) --  MOST Form in Place? --      IV Access:   Peripheral IV   Procedures and diagnostic studies:   CT ABDOMEN PELVIS W CONTRAST Result Date: 01/01/2024 EXAM: CT ABDOMEN AND PELVIS WITH CONTRAST 01/01/2024 07:13:00 PM TECHNIQUE: CT of the abdomen and pelvis was performed with the administration of 50 mL of iohexol  (OMNIPAQUE ) 350 MG/ML injection. Multiplanar reformatted images are provided for review. Automated exposure control, iterative reconstruction, and/or weight-based adjustment of the mA/kV was utilized to reduce the radiation dose to as low as reasonably achievable. COMPARISON: 12/22/2023 CLINICAL HISTORY: Bowel obstruction suspected. FINDINGS: LOWER CHEST: See chest CT report today. LIVER: The liver is unremarkable. GALLBLADDER AND BILE  DUCTS: Gallbladder is unremarkable. No biliary ductal dilatation. SPLEEN: No acute abnormality. PANCREAS: No acute abnormality. ADRENAL GLANDS: No acute abnormality. KIDNEYS,  URETERS AND BLADDER: No stones in the kidneys or ureters. No hydronephrosis. No perinephric or periureteral stranding. Urinary bladder is unremarkable. GI AND BOWEL: Gaseous distention of the bowel involves both large and small bowel; however, distal small bowel loops are decompressed. While I favor this most likely reflects ileus, it is difficult to completely exclude distal small bowel obstruction. Moderate stool burden throughout the colon. Normal appendix. PERITONEUM AND RETROPERITONEUM: No ascites. No free air. VASCULATURE: Aorta is normal in caliber. Changes of prior aortobifemoral bypass. LYMPH NODES: No lymphadenopathy. REPRODUCTIVE ORGANS: Prostate enlargement. 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 No focal soft tissue abnormality. IMPRESSION: 1. Gaseous distention of bowel involving both large and small bowel with decompressed distal small bowel loops, most likely ileus; distal small bowel obstruction cannot be completely excluded. 2. 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 Electronically signed by: Peter Crease MD 01/01/2024 07:31 PM EST RP Workstation: [id number]   CT Angio Chest PE W and/or Wo Contrast Result Date: 01/01/2024 EXAM: CTA of the Chest without and with contrast for PE 01/01/2024 07:13:00 PM TECHNIQUE: CTA of the chest was performed without and with the administration of 50 mL of iohexol  (OMNIPAQUE ) 350 MG/ML injection. Multiplanar reformatted images are provided for review. MIP images are provided for review. Automated exposure control, iterative reconstruction, and/or weight based adjustment of the mA/kV was utilized to reduce the radiation dose to as low as reasonably achievable. COMPARISON: 12/22/2023 CLINICAL HISTORY: Pulmonary embolism (PE) suspected, high prob. FINDINGS: PULMONARY ARTERIES: Pulmonary arteries are adequately opacified for  evaluation. No pulmonary embolism. Main pulmonary artery is normal in caliber. MEDIASTINUM: The heart and pericardium demonstrate no acute abnormality. There is no acute abnormality of the thoracic aorta. Right hilar and mediastinal adenopathy again noted, stable. LYMPH NODES: Right hilar and mediastinal adenopathy again noted, stable. No axillary lymphadenopathy. LUNGS AND PLEURA: Right upper lobe mass measures 3 x 3 cm on image 58. No focal consolidation or pulmonary edema. No pleural effusion or pneumothorax. UPPER ABDOMEN: Limited images of the upper abdomen are unremarkable. SOFT TISSUES AND BONES: Numerous lytic bony metastases throughout the ribs with soft tissue masses are unchanged. Stable lytic metastases throughout the thoracic spine. IMPRESSION: 1. No evidence of pulmonary embolism. 2. Stable right upper lobe mass with unchanged right hilar and mediastinal adenopathy and diffuse lytic osseous metastases in the ribs and thoracic spine. Electronically signed by: Peter Crease MD 01/01/2024 07:26 PM EST RP Workstation: [id number]   DG Chest 2 View Result Date: 01/01/2024 EXAM: 2 VIEW(S) XRAY OF THE CHEST 01/01/2024 06:36:16 PM COMPARISON: None available. CLINICAL HISTORY: chest pain FINDINGS: LINES, TUBES AND DEVICES: Left chest pacemaker with leads overlying right atrium and right ventricle. LUNGS AND PLEURA: Right upper lung opacity. Right hilar asymmetric density. Ovoid pleural-based opacity at the right apex corresponding to rib lesion on CT. No pulmonary edema. No pleural effusion. No pneumothorax. HEART AND MEDIASTINUM: No acute abnormality of the cardiac an silhouettes. BONES AND SOFT TISSUES: Known right second and third rib lesions. IMPRESSION: 1. Right upper lung and hilar opacity corresponding to the mass recently demonstrated on CT. 2. Right second and third rib lesions corresponding to metastatic lesions seen on CT Electronically signed by: Peter Bun MD 01/01/2024 06:56 PM EST RP  Workstation: [id number]     Medical Consultants:  None.   Subjective:    Peter Lambert denies any chest pain passing gas last bowel movement was about 4 days prior to admission  Objective:    Vitals:   01/02/24 0218 01/02/24 0506 01/02/24 0508 01/02/24 0614  BP:   114/67   Pulse:   84   Resp:  20 19   Temp: 97.9 F (36.6 C)   98.1 F (36.7 C)  TempSrc: Oral   Oral  SpO2:   100%   Weight:      Height:       SpO2: 100 %  No intake or output data in the 24 hours ending 01/02/24 0641 Filed Weights   01/01/24 1656  Weight: 66.2 kg    Exam: General exam: In no acute distress. Respiratory system: Good air movement and clear to auscultation. Cardiovascular system: S1 & S2 heard, RRR. No JVD. Gastrointestinal system: Abdomen is nondistended, soft and nontender.  Extremities: No pedal edema. Skin: No rashes, lesions or ulcers Psychiatry: Judgement and insight appear normal. Mood & affect appropriate.    Data Reviewed:    Labs: Basic Metabolic Panel: Recent Labs  Lab 12/27/23 0933 01/01/24 1705  NA 139 137  K 4.3 4.1  CL 100 100  CO2 24 24  GLUCOSE 120* 117*  Lambert 35* 17  CREATININE 2.20* 1.44*  CALCIUM  10.5* 11.0*   GFR Estimated Creatinine Clearance: 38.3 mL/min (A) (by C-G formula based on SCr of 1.44 mg/dL (H)). Liver Function Tests: Recent Labs  Lab 12/27/23 0933  AST 49*  ALT 30  ALKPHOS 117  BILITOT 0.4  PROT 8.0  ALBUMIN 3.8   No results for input(s): LIPASE, AMYLASE in the last 168 hours. No results for input(s): AMMONIA in the last 168 hours. Coagulation profile No results for input(s): INR, PROTIME in the last 168 hours. COVID-19 Labs  No results for input(s): DDIMER, FERRITIN, LDH, CRP in the last 72 hours.  Lab Results  Component Value Date   SARSCOV2NAA NEGATIVE 04/30/2020   SARSCOV2NAA NEGATIVE 03/16/2020   SARSCOV2NAA NEGATIVE 01/04/2020   SARSCOV2NAA NEGATIVE 08/29/2019    CBC: Recent Labs  Lab  12/27/23 0933 01/01/24 1705  WBC 11.1* 10.1  NEUTROABS 8.5*  --   HGB 10.6* 10.0*  HCT 33.8* 33.2*  MCV 86.2 86.2  PLT 372 380   Cardiac Enzymes: No results for input(s): CKTOTAL, CKMB, CKMBINDEX, TROPONINI in the last 168 hours. BNP (last 3 results) No results for input(s): PROBNP in the last 8760 hours. CBG: No results for input(s): GLUCAP in the last 168 hours. D-Dimer: No results for input(s): DDIMER in the last 72 hours. Hgb A1c: No results for input(s): HGBA1C in the last 72 hours. Lipid Profile: No results for input(s): CHOL, HDL, LDLCALC, TRIG, CHOLHDL, LDLDIRECT in the last 72 hours. Thyroid  function studies: No results for input(s): TSH, T4TOTAL, T3FREE, THYROIDAB in the last 72 hours.  Invalid input(s): FREET3 Anemia work up: No results for input(s): VITAMINB12, FOLATE, FERRITIN, TIBC, IRON, RETICCTPCT in the last 72 hours. Sepsis Labs: Recent Labs  Lab 12/27/23 0933 01/01/24 1705  WBC 11.1* 10.1   Microbiology No results found for this or any previous visit (from the past 240 hours).   Medications:    apixaban   5 mg Oral BID   aspirin  EC  81 mg Oral Daily   atorvastatin   80 mg Oral Daily   carvedilol   3.125 mg Oral BID WC   furosemide   20 mg Oral Daily   losartan   25 mg  Oral Daily   spironolactone   50 mg Oral Daily   Continuous Infusions:    LOS: 0 days   Peter Lambert  [facility]  01/02/2024, 6:41 AM

## 2024-01-02 NOTE — Progress Notes (Signed)
 Patient states he wears oxygen , 2.5L [place], at home.

## 2024-01-02 NOTE — Progress Notes (Addendum)
 Progress Note  Patient Name: Peter Lambert Been Date of Encounter: 01/02/2024 [facility] Cardiologist: Dorn Lesches, MD   Interval Summary   Patient shared that his chest pain is sometimes after eating though it does not always occur.  He described it as a dull ache.  Denied anginal chest pain.  Did take a nitroglycerin  yesterday and a couple months ago reported it did improve his chest pain. Has DOE and uses oxygen  at home. Has been experiencing weight loss Denied peripheral edema.  Vital Signs Vitals:   01/02/24 0218 01/02/24 0506 01/02/24 0508 01/02/24 0614  BP:   114/67   Pulse:   84   Resp:  20 19   Temp: 97.9 F (36.6 C)   98.1 F (36.7 C)  TempSrc: Oral   Oral  SpO2:   100%   Weight:      Height:       No intake or output data in the 24 hours ending 01/02/24 0733    01/01/2024    4:56 PM 01/01/2024    3:08 PM 01/01/2024    9:31 AM  Last 3 Weights  Weight (lbs) 146 lb 146 lb 6.4 oz 145 lb  Weight (kg) 66.225 kg 66.407 kg 65.772 kg      Telemetry/ECG  AF HR~85 with frequent PVCs- Personally Reviewed  Physical Exam  GEN: No acute distress.   Neck: No JVD Cardiac: Irregularly irregular, no murmurs, rubs, or gallops.  Respiratory: Clear to auscultation bilaterally. GI: Soft, nontender, non-distended  MS: No edema  Assessment & Plan  Peter Lambert is a 78 y.o. male with visit-pertinent history of CAD s/p DES to LAD in 2010, V-fib arrest in 2021 with LHC indicating occluded RCA and second marginal branch unable to be opened, EF at time of V-fib arrest was 20%, on LV gram on LHC had improved to 55%, hypertension, persistent atrial fibrillation on Eliquis , hyperlipidemia, tobacco use, and PAD s/p remote aortobifemoral bypass grafting presented to the ED on 11/5 at the request of outpatient cardiology after he presented for an acute visit for pre-operative clearance for bronchoscopy after new lung cancer diagnosis. Dr.Berry was consulted at that visit and  recommended further evaluation in the inpatient setting.  CAD s/p DES to LAD in 2010 Atypical chest pain Pre-operative Evaluation Based on chart review and personal interview, patient is a poor historian and his chest pain symptomatology is hard to discern/inconsistent.  He does report improvement with nitroglycerin , though his story on frequency has changed.  Chest pain is sometimes after eating, described as a dull ache that goes away within seconds.  Denied exertional component. He does have poor functional capacity METS <4.  Troponin negative  Echo pending  Given known disease, poor functional capacity, and chest pain that is sometimes relieved by nitroglycerin  will pursue ischemic evaluation for pre-operative evaluation.  Modality in which to pursue further ischemic evaluation is complicated by timeline for pending bronchoscopy and symptomology.  Typically would pursue PET scan outpatient, though unlikely to be able to be obtained prior to needed date.  He does have underlying CKD, creatinine is at baseline.  Will consider cardiac catheterization versus Myoview for ischemic evaluation given timeline. Will discuss with Dr. Sheena.  Continue ASA 81 mg Continue Coreg  3.125 mg BID  Hyperlipidemia  Continue lipitor  80 mg  HFimEF Imaging without evidence of volume overload BNP 224 Echo pending On exam appears, euvolemic  Per device heart logic score on the rise, 22 currently. Patient currently receiving IV fluids  after receiving IV contrast for CT imaging.  Will need to continue to monitor patient for volume overload.  Continue coreg  as above Continue losartan  25 mg Continue spironolactone  50 mg Continue lasix  20 mg   Hx VF arrest s/p ICD Device check yesterday during outpatient cardiology appointment. Atrial lead impedance has been an a gradual decline though remains WNL  Estimated longevity 7.5 years No VT or VF noted on telemetry or device check.   Persistent Atrial  Fibrillation Currently in rate controlled A-fib Chad Vasc score 6   A-fib burden is low 3%, though has increased recentlyA-fib started 11/4 per device check yesterday. Will continue to monitor, may need to intervene if patient becomes symptomatic. Will follow-up on echocardiogram. Will hold off on pursuing cardioversion or other rhythm control as patient would not be able to anticoagulate with pending bronchoscopy  Continue coreg  Continue eliquis  5 mg BID for now, may transition to IV heparin  pending work-up [ will need to stop eliquis  11/8 for pending bronchoscopy]  Hypertension BP: 114/67 Medications as above  Per primary  Lung mass with metastasis undergoing formal diagnosis Obstipation Tobacco Use CKD- at baseline Chronic anemia   For questions or updates, please contact [facility] Please consult [email or web address] for contact info under       Signed, Leontine LOISE Salen, PA-C   Patient seen and examined, note reviewed with the signed Advanced Practice Provider. I personally reviewed laboratory data, imaging studies and relevant notes. I independently examined the patient and formulated the important aspects of the plan. I have personally discussed the plan with the patient and/or family. Comments or changes to the note/plan are indicated below.  Patient seen and examined by his bedside in the emergency department.  Based on chart review and even talking to the patient he has had lots of different scenarios suspected from my side that he is supposed historian.  But 1 thing that is  clear is the fact that he is experiencing chest discomfort which is not consistent with typical angina.  So for now we will get an echocardiogram if there are any wall motion abnormalities we will pursue cardiac catheterization.    So after reviewing his chart he is not on any antianginals.  I will put him on antianginals nitro long-acting.  His story is ambiguous with chest pain not consistent with  typical angina.  So the ideal clinical intervention here is to add the started on antianginal since he has been on none and have him on the heparin  for the next 24 hours.  If symptoms does not change or echo shows wall motion abnormalities then we can go ahead and reassess the need for an ischemic evaluation.  Chronic atrial fibrillation his Sim has been held, continue his Coreg .  I agree with consulting pulmonary to see if this bronchoscopy can be done in the inpatient setting.  Will continue to follow with you  Reymond Maynez DO, MS [facility] Attending Cardiologist [facility]  [address] [place], [place] [address] [phone] Website: [email or web address]

## 2024-01-02 NOTE — Progress Notes (Signed)
 PHARMACY - ANTICOAGULATION CONSULT NOTE  Pharmacy Consult for IV heparin  Indication: atrial fibrillation, ACS  No Known Allergies  Patient Measurements: Height: 5' 8 (172.7 cm) Weight: 66.2 kg (146 lb) IBW/kg (Calculated) : 68.4 HEPARIN  DW (KG): 66.2  Vital Signs: Temp: 98.1 F (36.7 C) (11/06 0614) Temp Source: Oral (11/06 0614) BP: 114/67 (11/06 0508) Pulse Rate: 84 (11/06 0508)  Labs: Recent Labs    01/01/24 1705 01/01/24 2306  HGB 10.0*  --   HCT 33.2*  --   PLT 380  --   CREATININE 1.44*  --   TROPONINIHS 13 14    Estimated Creatinine Clearance: 38.3 mL/min (A) (by C-G formula based on SCr of 1.44 mg/dL (H)).   Medical History: Past Medical History:  Diagnosis Date   Acute upper GI bleed    Coronary artery disease    a. s/p NSTEMI in 2010 with DES to proximal LAD with D1 jailed and angioplasty alone to ostium   Hyperlipidemia    Hypertension    Hypertension    Peripheral arterial disease    Tobacco abuse    Type 2 diabetes mellitus (HCC)    Assessment: Peter Lambert is a 78 y.o. year old male admitted on 01/01/2024 with concern for chest pain. On eliquis  prior to admission for afib (last dose 11/2, pt reports stopped taking 4 days ago d/t bronchoscopy planned). Pharmacy consulted to dose heparin . Will trend aPTT until correlates with HL d/t eliquis  prior to admission.   Goal of Therapy:  Heparin  level 0.3-0.7 units/ml aPTT 66-102 seconds Monitor platelets by anticoagulation protocol: Yes   Plan:  Heparin  2000 units x 1 as bolus followed by heparin  infusion at 800 units/hr 8h aPTT  Daily aPTT, heparin  level, CBC, and monitoring for bleeding F/u plans for anticoagulation and cath  Thank you for allowing pharmacy to participate in this patient's care.  Leonor GORMAN Bash, PharmD Emergency Medicine Clinical Pharmacist 01/02/2024,10:20 AM

## 2024-01-03 ENCOUNTER — Encounter (HOSPITAL_COMMUNITY): Admission: RE | Admit: 2024-01-03 | Source: Ambulatory Visit

## 2024-01-03 DIAGNOSIS — I2081 Angina pectoris with coronary microvascular dysfunction: Secondary | ICD-10-CM | POA: Diagnosis not present

## 2024-01-03 DIAGNOSIS — I2510 Atherosclerotic heart disease of native coronary artery without angina pectoris: Secondary | ICD-10-CM | POA: Diagnosis not present

## 2024-01-03 LAB — BASIC METABOLIC PANEL WITH GFR
Anion gap: 10 (ref 5–15)
BUN: 17 mg/dL (ref 8–23)
CO2: 26 mmol/L (ref 22–32)
Calcium: 10.4 mg/dL — ABNORMAL HIGH (ref 8.9–10.3)
Chloride: 98 mmol/L (ref 98–111)
Creatinine, Ser: 1.46 mg/dL — ABNORMAL HIGH (ref 0.61–1.24)
GFR, Estimated: 48 mL/min — ABNORMAL LOW (ref >60)
Glucose, Bld: 70 mg/dL (ref 70–99)
Potassium: 4.3 mmol/L (ref 3.5–5.1)
Sodium: 134 mmol/L — ABNORMAL LOW (ref 135–145)

## 2024-01-03 LAB — CBC
HCT: 26.8 % — ABNORMAL LOW (ref 39.0–52.0)
Hemoglobin: 8.2 g/dL — ABNORMAL LOW (ref 13.0–17.0)
MCH: 25.9 pg — ABNORMAL LOW (ref 26.0–34.0)
MCHC: 30.6 g/dL (ref 30.0–36.0)
MCV: 84.5 fL (ref 80.0–100.0)
Platelets: 277 K/uL (ref 150–400)
RBC: 3.17 MIL/uL — ABNORMAL LOW (ref 4.22–5.81)
RDW: 14.6 % (ref 11.5–15.5)
WBC: 9.6 K/uL (ref 4.0–10.5)
nRBC: 0 % (ref 0.0–0.2)

## 2024-01-03 LAB — HEPARIN LEVEL (UNFRACTIONATED): Heparin Unfractionated: 0.25 [IU]/mL — ABNORMAL LOW (ref 0.30–0.70)

## 2024-01-03 LAB — APTT: aPTT: 65 s — ABNORMAL HIGH (ref 24–36)

## 2024-01-03 MED ORDER — APIXABAN 5 MG PO TABS: 5.0000 mg | ORAL_TABLET | Freq: Two times a day (BID) | ORAL | 5 refills | Status: DC

## 2024-01-03 NOTE — Progress Notes (Signed)
 [facility] PROGRESS NOTE    Progress Note  Peter Lambert  FMW:[id number] DOB: [birth year] DOA: 01/01/2024 PCP: Bertell Satterfield, MD     Brief Narrative:   Peter Lambert is an 78 y.o. male past medical history of CAD with a history of EF DES DES to the LAD in 2010, history of V-fib in 2021 with a left heart cath showing RCA obstruction, a 2D echo done postarrest showed improved EF to 55%, Essential hypertension, persistent atrial fibrillation on Eliquis  tobacco abuse PAD with remote history of aortofemoral bypass grafting seen in the cardiology's office on 01/01/2024 for preoperative cardiovascular evaluation for bronchoscopy and related started having episodes of chest pain 2-3 times a week which relieved by nitroglycerin .  Pain is substernal no exacerbating factors no radiation.   Assessment/Plan:   Typical Chest pain/  Coronary artery disease s/p DES to LAD in 2010, s/p PCI to proximal LAD stenosis with DES: Has poor functional capacity less than 4 metabolic equivalents.  Denies any exertional component. Cardiology was consulted, 2D echo showed an EF of 60% could not appreciate regional wall motion abnormality. Cardiology was consulted recommended long-acting nitroglycerin .  He remains on IV heparin  Continue Coreg  losartan  Aldactone  and statins. Resume statins. Cardiology to address heparin .  Chronic atrial fibrillation: Holding apixaban  in anticipation for bronchoscopy. Continue Coreg .  Lung mass with metastases: To follow-up with pulmonary as an outpatient for bronchoscopy.  Obstipation: CT scan of the abdomen pelvis showed gaseous distention of the bowel involving both large and small intestine with decompressed distal small bowel. Patient had multiple bowel movements yesterday.  Essential hypertension: Continue Coreg  losartan  and Aldactone .  Hyperlipidemia:  Sign continue statins.  Tobacco abuse: He has been counseled.  CKD (chronic kidney disease) stage  IIIb: With a baseline creatinine of 1.4-2.2. Appears to be at baseline.  DVT prophylaxis: Lovenox  Family Communication:daughter Status is: Inpatient Remains inpatient appropriate because: Atypical chest pain    Code Status:  Code Status History     Date Active Date Inactive Code Status Order ID Comments User Context   04/30/2020 0107 05/03/2020 2328 Full Code [id number]  Alfornia Madison, MD ED   01/04/2020 2318 01/08/2020 2203 Full Code [id number]  Manfred Driver, DO ED   08/29/2019 2317 09/05/2019 2141 Full Code [id number]  Gleason, Leita SAUNDERS, PA-C ED   04/23/2019 2326 04/28/2019 1955 Full Code [id number]  Margart Artemus HERO, MD ED   04/20/2019 1206 04/23/2019 1953 Full Code [id number]  Ricky Fines, MD ED   07/02/2016 1735 07/03/2016 2058 Full Code [id number]  Theophilus Andrews, Tully GRADE, MD Inpatient      Advance Directive Documentation    Flowsheet Row Most Recent Value  Type of Advance Directive Healthcare Power of Attorney, Living will  Pre-existing out of facility DNR order (yellow form or pink MOST form) --  MOST Form in Place? --      IV Access:   Peripheral IV   Procedures and diagnostic studies:   ECHOCARDIOGRAM COMPLETE Result Date: 01/02/2024    ECHOCARDIOGRAM REPORT   Patient Name:   Peter Lambert Date of Exam: 01/02/2024 Medical Rec #:  [id number]       Height:       68.0 in Accession #:    [id number]      Weight:       146.0 lb Date of Birth:  March 24, 1945      BSA:          1.788 m Patient Age:    75 years  BP:           114/67 mmHg Patient Gender: M               HR:           95 bpm. Exam Location:  Inpatient Procedure: 2D Echo, Cardiac Doppler and Color Doppler (Both Spectral and Color            Flow Doppler were utilized during procedure). Indications:    Chest Pain R07.9  History:        Patient has prior history of Echocardiogram examinations, most                 recent 08/30/2019. CHF, CAD, Pacemaker, COPD, Arrythmias:PVC and                 NSVT; Risk  Factors:Hyperlipidemia, Diabetes and Hypertension.  Sonographer:    BERNARDA ROCKS Referring Phys: [id number] SARA-MAIZ A THOMAS IMPRESSIONS  1. Left ventricular ejection fraction, by estimation, is 60 to 65%. The left ventricle has normal function. Left ventricular endocardial border not optimally defined to evaluate regional wall motion. Left ventricular diastolic parameters are indeterminate.  2. Right ventricular systolic function is normal. The right ventricular size is normal.  3. The mitral valve is abnormal. Mild mitral valve regurgitation.  4. The aortic valve was not well visualized. Aortic valve regurgitation is not visualized.  5. The inferior vena cava is dilated in size with <50% respiratory variability, suggesting right atrial pressure of 15 mmHg. Conclusion(s)/Recommendation(s): Technically very limited study due to poor sound wave transmission. Many structures not seen well. FINDINGS  Left Ventricle: Left ventricular ejection fraction, by estimation, is 60 to 65%. The left ventricle has normal function. Left ventricular endocardial border not optimally defined to evaluate regional wall motion. The left ventricular internal cavity size was normal in size. Suboptimal image quality limits for assessment of left ventricular hypertrophy. Left ventricular diastolic parameters are indeterminate. Right Ventricle: The right ventricular size is normal. No increase in right ventricular wall thickness. Right ventricular systolic function is normal. Left Atrium: Left atrial size was normal in size. Right Atrium: Right atrial size was normal in size. Pericardium: There is no evidence of pericardial effusion. Mitral Valve: The mitral valve is abnormal. There is mild thickening of the mitral valve leaflet(s). Mild mitral valve regurgitation. MV peak gradient, 2.9 mmHg. The mean mitral valve gradient is 1.0 mmHg. Tricuspid Valve: The tricuspid valve is not well visualized. Tricuspid valve regurgitation is not  demonstrated. Aortic Valve: The aortic valve was not well visualized. Aortic valve regurgitation is not visualized. Aortic valve mean gradient measures 1.0 mmHg. Aortic valve peak gradient measures 1.6 mmHg. Aortic valve area, by VTI measures 4.13 cm. Pulmonic Valve: The pulmonic valve was not well visualized. Pulmonic valve regurgitation is not visualized. Aorta: The aortic root is normal in size and structure. Venous: The inferior vena cava is dilated in size with less than 50% respiratory variability, suggesting right atrial pressure of 15 mmHg. IAS/Shunts: The interatrial septum was not well visualized. Additional Comments: A device lead is visualized.  LEFT VENTRICLE PLAX 2D LVIDd:         4.80 cm      Diastology LVIDs:         3.20 cm      LV e' medial:    5.98 cm/s LV PW:         0.90 cm      LV E/e' medial:  15.6 LV IVS:  1.00 cm      LV e' lateral:   13.20 cm/s LVOT diam:     2.10 cm      LV E/e' lateral: 7.1 LV SV:         40 LV SV Index:   22 LVOT Area:     3.46 cm  LV Volumes (MOD) LV vol d, MOD A2C: 99.7 ml LV vol d, MOD A4C: 159.0 ml LV vol s, MOD A2C: 37.6 ml LV vol s, MOD A4C: 54.4 ml LV SV MOD A2C:     62.1 ml LV SV MOD A4C:     159.0 ml LV SV MOD BP:      80.6 ml RIGHT VENTRICLE             IVC RV Basal diam:  3.50 cm     IVC diam: 2.10 cm RV S prime:     11.00 cm/s TAPSE (M-mode): 2.5 cm LEFT ATRIUM             Index        RIGHT ATRIUM           Index LA diam:        3.70 cm 2.07 cm/m   RA Area:     15.10 cm LA Vol (A2C):   26.9 ml 15.05 ml/m  RA Volume:   38.70 ml  21.65 ml/m LA Vol (A4C):   40.8 ml 22.82 ml/m LA Biplane Vol: 36.2 ml 20.25 ml/m  AORTIC VALVE                    PULMONIC VALVE AV Area (Vmax):    3.55 cm     PV Vmax:       0.68 m/s AV Area (Vmean):   3.30 cm     PV Peak grad:  1.8 mmHg AV Area (VTI):     4.13 cm AV Vmax:           64.10 cm/s AV Vmean:          41.400 cm/s AV VTI:            0.097 m AV Peak Grad:      1.6 mmHg AV Mean Grad:      1.0 mmHg LVOT Vmax:          65.70 cm/s LVOT Vmean:        39.500 cm/s LVOT VTI:          0.116 m LVOT/AV VTI ratio: 1.19  AORTA Ao Root diam: 3.40 cm Ao Asc diam:  3.50 cm MITRAL VALVE MV Area (PHT): 4.71 cm    SHUNTS MV Area VTI:   2.59 cm    Systemic VTI:  0.12 m MV Peak grad:  2.9 mmHg    Systemic Diam: 2.10 cm MV Mean grad:  1.0 mmHg MV Vmax:       0.84 m/s MV Vmean:      43.0 cm/s MV Decel Time: 161 msec MV E velocity: 93.30 cm/s MV A velocity: 46.70 cm/s MV E/A ratio:  2.00 Toribio Fuel MD Electronically signed by Toribio Fuel MD Signature Date/Time: 01/02/2024/11:52:55 PM    Final    CT ABDOMEN PELVIS W CONTRAST Result Date: 01/01/2024 EXAM: CT ABDOMEN AND PELVIS WITH CONTRAST 01/01/2024 07:13:00 PM TECHNIQUE: CT of the abdomen and pelvis was performed with the administration of 50 mL of iohexol  (OMNIPAQUE ) 350 MG/ML injection. Multiplanar reformatted images are provided for review. Automated exposure control, iterative reconstruction, and/or weight-based adjustment  of the mA/kV was utilized to reduce the radiation dose to as low as reasonably achievable. COMPARISON: 12/22/2023 CLINICAL HISTORY: Bowel obstruction suspected. FINDINGS: LOWER CHEST: See chest CT report today. LIVER: The liver is unremarkable. GALLBLADDER AND BILE DUCTS: Gallbladder is unremarkable. No biliary ductal dilatation. SPLEEN: No acute abnormality. PANCREAS: No acute abnormality. ADRENAL GLANDS: No acute abnormality. KIDNEYS, URETERS AND BLADDER: No stones in the kidneys or ureters. No hydronephrosis. No perinephric or periureteral stranding. Urinary bladder is unremarkable. GI AND BOWEL: Gaseous distention of the bowel involves both large and small bowel; however, distal small bowel loops are decompressed. While I favor this most likely reflects ileus, it is difficult to completely exclude distal small bowel obstruction. Moderate stool burden throughout the colon. Normal appendix. PERITONEUM AND RETROPERITONEUM: No ascites. No free air.  VASCULATURE: Aorta is normal in caliber. Changes of prior aortobifemoral bypass. LYMPH NODES: No lymphadenopathy. REPRODUCTIVE ORGANS: Prostate enlargement. 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 No focal soft tissue abnormality. IMPRESSION: 1. Gaseous distention of bowel involving both large and small bowel with decompressed distal small bowel loops, most likely ileus; distal small bowel obstruction cannot be completely excluded. 2. 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 Electronically signed by: Franky Crease MD 01/01/2024 07:31 PM EST RP Workstation: [id number]   CT Angio Chest PE W and/or Wo Contrast Result Date: 01/01/2024 EXAM: CTA of the Chest without and with contrast for PE 01/01/2024 07:13:00 PM TECHNIQUE: CTA of the chest was performed without and with the administration of 50 mL of iohexol  (OMNIPAQUE ) 350 MG/ML injection. Multiplanar reformatted images are provided for review. MIP images are provided for review. Automated exposure control, iterative reconstruction, and/or weight based adjustment of the mA/kV was utilized to reduce the radiation dose to as low as reasonably achievable. COMPARISON: 12/22/2023 CLINICAL HISTORY: Pulmonary embolism (PE) suspected, high prob. FINDINGS: PULMONARY ARTERIES: Pulmonary arteries are adequately opacified for evaluation. No pulmonary embolism. Main pulmonary artery is normal in caliber. MEDIASTINUM: The heart and pericardium demonstrate no acute abnormality. There is no acute abnormality of the thoracic aorta. Right hilar and mediastinal adenopathy again noted, stable. LYMPH NODES: Right hilar and mediastinal adenopathy again noted, stable. No axillary lymphadenopathy. LUNGS AND PLEURA: Right upper lobe mass measures 3 x 3 cm on image 58. No focal consolidation or pulmonary edema. No pleural effusion or pneumothorax. UPPER  ABDOMEN: Limited images of the upper abdomen are unremarkable. SOFT TISSUES AND BONES: Numerous lytic bony metastases throughout the ribs with soft tissue masses are unchanged. Stable lytic metastases throughout the thoracic spine. IMPRESSION: 1. No evidence of pulmonary embolism. 2. Stable right upper lobe mass with unchanged right hilar and mediastinal adenopathy and diffuse lytic osseous metastases in the ribs and thoracic spine. Electronically signed by: Franky Crease MD 01/01/2024 07:26 PM EST RP Workstation: [id number]   DG Chest 2 View Result Date: 01/01/2024 EXAM: 2 VIEW(S) XRAY OF THE CHEST 01/01/2024 06:36:16 PM COMPARISON: None available. CLINICAL HISTORY: chest pain FINDINGS: LINES, TUBES AND DEVICES: Left chest pacemaker with leads overlying right atrium and right ventricle. LUNGS AND PLEURA: Right upper lung opacity. Right hilar asymmetric density. Ovoid pleural-based opacity at the right apex corresponding to rib lesion on CT. No pulmonary edema. No pleural effusion. No pneumothorax. HEART AND MEDIASTINUM: No acute abnormality of the cardiac an silhouettes. BONES AND SOFT TISSUES: Known right second and third rib lesions. IMPRESSION: 1. Right upper lung and hilar opacity corresponding to the  mass recently demonstrated on CT. 2. Right second and third rib lesions corresponding to metastatic lesions seen on CT Electronically signed by: Luke Bun MD 01/01/2024 06:56 PM EST RP Workstation: [id number]   CUP PACEART INCLINIC DEVICE CHECK Result Date: 01/01/2024 in-clinic _dual__ chamber ICD check. Presenting Rhythm: AF/VS_ . Routine testing was performed. Thresholds, sensing, and impedance demonstrate stable parameters and no programming changes needed. No treated arrhythmias. Estimated longevity __7.5 years__ . Pt enrolled in remote follow-up. AFib started 11/4, overall burden low, though increased of lat A lead impedance on a gradual downtrend though appears to have leveled off, and remains WNL no  VT RU    Medical Consultants:   None.   Subjective:    Malcolm Quast Helfrich denies any chest pain had multiple bowel movements.  Objective:    Vitals:   01/02/24 1820 01/02/24 2027 01/03/24 0058 01/03/24 0500  BP: (!) 94/49 (!) 87/51 (!) 108/50 (!) 104/57  Pulse: 84 77 78 77  Resp:  19 18   Temp:  98.2 F (36.8 C) 98.4 F (36.9 C)   TempSrc:  Oral Oral   SpO2: 100% 100% 100% 100%  Weight:      Height:       SpO2: 100 % O2 Flow Rate (L/min): 2 L/min   Intake/Output Summary (Last 24 hours) at 01/03/2024 0843 Last data filed at 01/03/2024 0700 Gross per 24 hour  Intake 405.65 ml  Output 350 ml  Net 55.65 ml   Filed Weights   01/01/24 1656 01/02/24 1302  Weight: 66.2 kg 63.5 kg    Exam: General exam: In no acute distress. Respiratory system: Good air movement and clear to auscultation. Cardiovascular system: S1 & S2 heard, RRR. No JVD. Gastrointestinal system: Abdomen is nondistended, soft and nontender.  Extremities: No pedal edema. Skin: No rashes, lesions or ulcers Psychiatry: Judgement and insight appear normal. Mood & affect appropriate.  Data Reviewed:    Labs: Basic Metabolic Panel: Recent Labs  Lab 01/01/24 1705 01/02/24 1742 01/03/24 0425  NA 137 135 134*  K 4.1 4.3 4.3  CL 100 101 98  CO2 24 27 26   GLUCOSE 117* 86 70  BUN 17 15 17   CREATININE 1.44* 1.34* 1.46*  CALCIUM  11.0* 10.2 10.4*   GFR Estimated Creatinine Clearance: 36.2 mL/min (A) (by C-G formula based on SCr of 1.46 mg/dL (H)). Liver Function Tests: No results for input(s): AST, ALT, ALKPHOS, BILITOT, PROT, ALBUMIN in the last 168 hours.  No results for input(s): LIPASE, AMYLASE in the last 168 hours. No results for input(s): AMMONIA in the last 168 hours. Coagulation profile No results for input(s): INR, PROTIME in the last 168 hours. COVID-19 Labs  No results for input(s): DDIMER, FERRITIN, LDH, CRP in the last 72 hours.  Lab Results   Component Value Date   SARSCOV2NAA NEGATIVE 04/30/2020   SARSCOV2NAA NEGATIVE 03/16/2020   SARSCOV2NAA NEGATIVE 01/04/2020   SARSCOV2NAA NEGATIVE 08/29/2019    CBC: Recent Labs  Lab 01/01/24 1705 01/02/24 1742 01/03/24 0425  WBC 10.1 9.5 9.6  HGB 10.0* 8.2* 8.2*  HCT 33.2* 26.3* 26.8*  MCV 86.2 83.8 84.5  PLT 380 291 277   Cardiac Enzymes: No results for input(s): CKTOTAL, CKMB, CKMBINDEX, TROPONINI in the last 168 hours. BNP (last 3 results) No results for input(s): PROBNP in the last 8760 hours. CBG: No results for input(s): GLUCAP in the last 168 hours. D-Dimer: No results for input(s): DDIMER in the last 72 hours. Hgb A1c: No results for input(s): HGBA1C  in the last 72 hours. Lipid Profile: No results for input(s): CHOL, HDL, LDLCALC, TRIG, CHOLHDL, LDLDIRECT in the last 72 hours. Thyroid  function studies: No results for input(s): TSH, T4TOTAL, T3FREE, THYROIDAB in the last 72 hours.  Invalid input(s): FREET3 Anemia work up: No results for input(s): VITAMINB12, FOLATE, FERRITIN, TIBC, IRON, RETICCTPCT in the last 72 hours. Sepsis Labs: Recent Labs  Lab 01/01/24 1705 01/02/24 1742 01/03/24 0425  WBC 10.1 9.5 9.6   Microbiology No results found for this or any previous visit (from the past 240 hours).   Medications:    aspirin   81 mg Oral Pre-Cath   aspirin  EC  81 mg Oral Daily   atorvastatin   80 mg Oral Daily   carvedilol   3.125 mg Oral BID WC   docusate sodium   100 mg Oral BID   furosemide   20 mg Oral Daily   isosorbide mononitrate  30 mg Oral Daily   losartan   25 mg Oral Daily   polyethylene glycol  17 g Oral BID   [START ON 01/04/2024] polyethylene glycol  17 g Oral Daily   senna  1 tablet Oral QHS   spironolactone   50 mg Oral Daily   Continuous Infusions:  sodium chloride  10 mL/hr at 01/03/24 0700   heparin  1,100 Units/hr (01/03/24 0700)      LOS: 1 day   Erle Odell Castor  [facility]  01/03/2024, 8:43 AM

## 2024-01-03 NOTE — Telephone Encounter (Signed)
 FYI- patient is currently hospitalized.

## 2024-01-03 NOTE — Progress Notes (Signed)
 Discharge education provided to patient. RN call patient's son, Daril, and provided him with discharge instructions to include education regarding medications and prescription.

## 2024-01-03 NOTE — Discharge Summary (Addendum)
 Physician Discharge Summary  Peter Lambert FMW:[id number] DOB: 1945-03-09 DOA: 01/01/2024  PCP: Bertell Satterfield, MD  Admit date: 01/01/2024 Discharge date: 01/03/2024  Admitted From: Home Disposition:  Home  Recommendations for Outpatient Follow-up:  Follow up with pulmonary on Tuesday for bronchoscopy Please obtain BMP/CBC in one week   Home Health:No Equipment/Devices:None  Discharge Condition:Stable CODE STATUS:Full Diet recommendation: Heart Healthy  Brief/Interim Summary: 78 y.o. male past medical history of CAD with a history of EF DES DES to the LAD in 2010, history of V-fib in 2021 with a left heart cath showing RCA obstruction, a 2D echo done postarrest showed improved EF to 55%, Essential hypertension, persistent atrial fibrillation on Eliquis  tobacco abuse PAD with remote history of aortofemoral bypass grafting seen in the cardiology's office on 01/01/2024 for preoperative cardiovascular evaluation for bronchoscopy and related started having episodes of chest pain 2-3 times a week which relieved by nitroglycerin .  Pain is substernal no exacerbating factors no radiation.   Discharge Diagnoses:  Principal Problem:   Chest pain Active Problems:   Coronary artery disease s/p DES to LAD in 2010, s/p PCI to proximal LAD stenosis with DES this hospitalization   Essential hypertension   CKD (chronic kidney disease), stage III b   Lung mass  Atypical chest pain/coronary artery disease: Has a poor functional status with less than 4 METS movements. He denies any exertional component to his chest pain. Cardiology was consulted perform a 2D echo that showed no regional wall motion abnormalities. Cardiology recommended long-acting nitroglycerin .  As he does not have an unstable cardiac condition. He will need to go proceed with Koska P on Tuesday as an outpatient.  Chronic atrial fibrillation: Resume Coreg  and Eliquis  no changes made.  Lung mass with mets: Follow-up with  endoscopy with pulmonary as an outpatient.  Constipation: CT scan of the abdomen pelvis showed possible ileus. He denied any nausea vomiting abdominal pain and was having bowel movements he was given MiraLAX  and had 3 bowel movements in the hospital.  Essential hypertension: No change made to his medication.  Hyperlipidemia: Continue statins.  Tobacco abuse: He is been counseled.  Chronic kidney disease stage IIIb: His creatinine remained at baseline.   Discharge Instructions  Discharge Instructions     Diet - low sodium heart healthy   Complete by: As directed    Increase activity slowly   Complete by: As directed       Allergies as of 01/03/2024   No Known Allergies      Medication List     STOP taking these medications    cyanocobalamin  1000 MCG tablet   cyanocobalamin  1000 MCG/ML injection Commonly known as: VITAMIN B12       TAKE these medications    apixaban  5 MG Tabs tablet Commonly known as: Eliquis  Take 1 tablet (5 mg total) by mouth 2 (two) times daily. Hold Eliquis  on 01/05/2024 for bronchoscopy What changed: additional instructions   aspirin  EC 81 MG tablet Take 81 mg by mouth daily. Swallow whole.   atorvastatin  80 MG tablet Commonly known as: LIPITOR  Take 80 mg by mouth daily.   carvedilol  3.125 MG tablet Commonly known as: COREG  Take 1 tablet (3.125 mg total) by mouth daily with breakfast.   [facility] Gentle Laxative 5 MG EC tablet Generic drug: bisacodyl  TAKE 1 TABLET BY MOUTH DAILY AS NEEDED FOR MODERATE CONSTIPATION. TAKE TWO DAYS BEFORE PROCEDURE What changed: See the new instructions.   fentaNYL  25 MCG/HR Commonly known as: DURAGESIC  Place 1 patch  onto the skin every 3 (three) days for 15 days.   ferrous sulfate  325 (65 FE) MG tablet Commonly known as: FerrouSul Take 1 tablet (325 mg total) by mouth daily with breakfast.   furosemide  20 MG tablet Commonly known as: LASIX  Take 20 mg by mouth daily.   losartan  25 MG  tablet Commonly known as: COZAAR  Take 25 mg by mouth daily.   nitroGLYCERIN  0.4 MG SL tablet Commonly known as: NITROSTAT  Place 1 tablet (0.4 mg total) under the tongue every 5 (five) minutes as needed for chest pain. Patient must keep upcoming appointment for further refills   omeprazole  20 MG capsule Commonly known as: PRILOSEC Take 20 mg by mouth daily.   ondansetron  8 MG tablet Commonly known as: ZOFRAN  Take 1 tablet (8 mg total) by mouth every 8 (eight) hours as needed for nausea or vomiting.   potassium chloride  SA 20 MEQ tablet Commonly known as: KLOR-CON  M Take 20 mEq by mouth daily.   spironolactone  50 MG tablet Commonly known as: ALDACTONE  Take 50 mg by mouth daily.        No Known Allergies  Consultations: Cardiology   Procedures/Studies: ECHOCARDIOGRAM COMPLETE Result Date: 01/02/2024    ECHOCARDIOGRAM REPORT   Patient Name:   Peter Lambert Date of Exam: 01/02/2024 Medical Rec #:  [id number]       Height:       68.0 in Accession #:    [id number]      Weight:       146.0 lb Date of Birth:  02-Jul-1945      BSA:          1.788 m Patient Age:    78 years        BP:           114/67 mmHg Patient Gender: M               HR:           95 bpm. Exam Location:  Inpatient Procedure: 2D Echo, Cardiac Doppler and Color Doppler (Both Spectral and Color            Flow Doppler were utilized during procedure). Indications:    Chest Pain R07.9  History:        Patient has prior history of Echocardiogram examinations, most                 recent 08/30/2019. CHF, CAD, Pacemaker, COPD, Arrythmias:PVC and                 NSVT; Risk Factors:Hyperlipidemia, Diabetes and Hypertension.  Sonographer:    BERNARDA ROCKS Referring Phys: [id number] SARA-MAIZ A THOMAS IMPRESSIONS  1. Left ventricular ejection fraction, by estimation, is 60 to 65%. The left ventricle has normal function. Left ventricular endocardial border not optimally defined to evaluate regional wall motion. Left ventricular diastolic  parameters are indeterminate.  2. Right ventricular systolic function is normal. The right ventricular size is normal.  3. The mitral valve is abnormal. Mild mitral valve regurgitation.  4. The aortic valve was not well visualized. Aortic valve regurgitation is not visualized.  5. The inferior vena cava is dilated in size with <50% respiratory variability, suggesting right atrial pressure of 15 mmHg. Conclusion(s)/Recommendation(s): Technically very limited study due to poor sound wave transmission. Many structures not seen well. FINDINGS  Left Ventricle: Left ventricular ejection fraction, by estimation, is 60 to 65%. The left ventricle has normal function. Left ventricular endocardial border not optimally defined to  evaluate regional wall motion. The left ventricular internal cavity size was normal in size. Suboptimal image quality limits for assessment of left ventricular hypertrophy. Left ventricular diastolic parameters are indeterminate. Right Ventricle: The right ventricular size is normal. No increase in right ventricular wall thickness. Right ventricular systolic function is normal. Left Atrium: Left atrial size was normal in size. Right Atrium: Right atrial size was normal in size. Pericardium: There is no evidence of pericardial effusion. Mitral Valve: The mitral valve is abnormal. There is mild thickening of the mitral valve leaflet(s). Mild mitral valve regurgitation. MV peak gradient, 2.9 mmHg. The mean mitral valve gradient is 1.0 mmHg. Tricuspid Valve: The tricuspid valve is not well visualized. Tricuspid valve regurgitation is not demonstrated. Aortic Valve: The aortic valve was not well visualized. Aortic valve regurgitation is not visualized. Aortic valve mean gradient measures 1.0 mmHg. Aortic valve peak gradient measures 1.6 mmHg. Aortic valve area, by VTI measures 4.13 cm. Pulmonic Valve: The pulmonic valve was not well visualized. Pulmonic valve regurgitation is not visualized. Aorta: The  aortic root is normal in size and structure. Venous: The inferior vena cava is dilated in size with less than 50% respiratory variability, suggesting right atrial pressure of 15 mmHg. IAS/Shunts: The interatrial septum was not well visualized. Additional Comments: A device lead is visualized.  LEFT VENTRICLE PLAX 2D LVIDd:         4.80 cm      Diastology LVIDs:         3.20 cm      LV e' medial:    5.98 cm/s LV PW:         0.90 cm      LV E/e' medial:  15.6 LV IVS:        1.00 cm      LV e' lateral:   13.20 cm/s LVOT diam:     2.10 cm      LV E/e' lateral: 7.1 LV SV:         40 LV SV Index:   22 LVOT Area:     3.46 cm  LV Volumes (MOD) LV vol d, MOD A2C: 99.7 ml LV vol d, MOD A4C: 159.0 ml LV vol s, MOD A2C: 37.6 ml LV vol s, MOD A4C: 54.4 ml LV SV MOD A2C:     62.1 ml LV SV MOD A4C:     159.0 ml LV SV MOD BP:      80.6 ml RIGHT VENTRICLE             IVC RV Basal diam:  3.50 cm     IVC diam: 2.10 cm RV S prime:     11.00 cm/s TAPSE (M-mode): 2.5 cm LEFT ATRIUM             Index        RIGHT ATRIUM           Index LA diam:        3.70 cm 2.07 cm/m   RA Area:     15.10 cm LA Vol (A2C):   26.9 ml 15.05 ml/m  RA Volume:   38.70 ml  21.65 ml/m LA Vol (A4C):   40.8 ml 22.82 ml/m LA Biplane Vol: 36.2 ml 20.25 ml/m  AORTIC VALVE                    PULMONIC VALVE AV Area (Vmax):    3.55 cm     PV Vmax:  0.68 m/s AV Area (Vmean):   3.30 cm     PV Peak grad:  1.8 mmHg AV Area (VTI):     4.13 cm AV Vmax:           64.10 cm/s AV Vmean:          41.400 cm/s AV VTI:            0.097 m AV Peak Grad:      1.6 mmHg AV Mean Grad:      1.0 mmHg LVOT Vmax:         65.70 cm/s LVOT Vmean:        39.500 cm/s LVOT VTI:          0.116 m LVOT/AV VTI ratio: 1.19  AORTA Ao Root diam: 3.40 cm Ao Asc diam:  3.50 cm MITRAL VALVE MV Area (PHT): 4.71 cm    SHUNTS MV Area VTI:   2.59 cm    Systemic VTI:  0.12 m MV Peak grad:  2.9 mmHg    Systemic Diam: 2.10 cm MV Mean grad:  1.0 mmHg MV Vmax:       0.84 m/s MV Vmean:      43.0 cm/s  MV Decel Time: 161 msec MV E velocity: 93.30 cm/s MV A velocity: 46.70 cm/s MV E/A ratio:  2.00 Toribio Fuel MD Electronically signed by Toribio Fuel MD Signature Date/Time: 01/02/2024/11:52:55 PM    Final    CT ABDOMEN PELVIS W CONTRAST Result Date: 01/01/2024 EXAM: CT ABDOMEN AND PELVIS WITH CONTRAST 01/01/2024 07:13:00 PM TECHNIQUE: CT of the abdomen and pelvis was performed with the administration of 50 mL of iohexol  (OMNIPAQUE ) 350 MG/ML injection. Multiplanar reformatted images are provided for review. Automated exposure control, iterative reconstruction, and/or weight-based adjustment of the mA/kV was utilized to reduce the radiation dose to as low as reasonably achievable. COMPARISON: 12/22/2023 CLINICAL HISTORY: Bowel obstruction suspected. FINDINGS: LOWER CHEST: See chest CT report today. LIVER: The liver is unremarkable. GALLBLADDER AND BILE DUCTS: Gallbladder is unremarkable. No biliary ductal dilatation. SPLEEN: No acute abnormality. PANCREAS: No acute abnormality. ADRENAL GLANDS: No acute abnormality. KIDNEYS, URETERS AND BLADDER: No stones in the kidneys or ureters. No hydronephrosis. No perinephric or periureteral stranding. Urinary bladder is unremarkable. GI AND BOWEL: Gaseous distention of the bowel involves both large and small bowel; however, distal small bowel loops are decompressed. While I favor this most likely reflects ileus, it is difficult to completely exclude distal small bowel obstruction. Moderate stool burden throughout the colon. Normal appendix. PERITONEUM AND RETROPERITONEUM: No ascites. No free air. VASCULATURE: Aorta is normal in caliber. Changes of prior aortobifemoral bypass. LYMPH NODES: No lymphadenopathy. REPRODUCTIVE ORGANS: Prostate enlargement. 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 No focal soft tissue abnormality. IMPRESSION: 1. Gaseous distention of bowel involving both large  and small bowel with decompressed distal small bowel loops, most likely ileus; distal small bowel obstruction cannot be completely excluded. 2. 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 Electronically signed by: Franky Crease MD 01/01/2024 07:31 PM EST RP Workstation: [id number]   CT Angio Chest PE W and/or Wo Contrast Result Date: 01/01/2024 EXAM: CTA of the Chest without and with contrast for PE 01/01/2024 07:13:00 PM TECHNIQUE: CTA of the chest was performed without and with the administration of 50 mL of iohexol  (OMNIPAQUE ) 350 MG/ML injection. Multiplanar reformatted images are provided for review. MIP images are provided for review. Automated exposure control, iterative  reconstruction, and/or weight based adjustment of the mA/kV was utilized to reduce the radiation dose to as low as reasonably achievable. COMPARISON: 12/22/2023 CLINICAL HISTORY: Pulmonary embolism (PE) suspected, high prob. FINDINGS: PULMONARY ARTERIES: Pulmonary arteries are adequately opacified for evaluation. No pulmonary embolism. Main pulmonary artery is normal in caliber. MEDIASTINUM: The heart and pericardium demonstrate no acute abnormality. There is no acute abnormality of the thoracic aorta. Right hilar and mediastinal adenopathy again noted, stable. LYMPH NODES: Right hilar and mediastinal adenopathy again noted, stable. No axillary lymphadenopathy. LUNGS AND PLEURA: Right upper lobe mass measures 3 x 3 cm on image 58. No focal consolidation or pulmonary edema. No pleural effusion or pneumothorax. UPPER ABDOMEN: Limited images of the upper abdomen are unremarkable. SOFT TISSUES AND BONES: Numerous lytic bony metastases throughout the ribs with soft tissue masses are unchanged. Stable lytic metastases throughout the thoracic spine. IMPRESSION: 1. No evidence of pulmonary embolism. 2. Stable right upper lobe mass with unchanged right hilar and mediastinal adenopathy and diffuse  lytic osseous metastases in the ribs and thoracic spine. Electronically signed by: Franky Crease MD 01/01/2024 07:26 PM EST RP Workstation: [id number]   DG Chest 2 View Result Date: 01/01/2024 EXAM: 2 VIEW(S) XRAY OF THE CHEST 01/01/2024 06:36:16 PM COMPARISON: None available. CLINICAL HISTORY: chest pain FINDINGS: LINES, TUBES AND DEVICES: Left chest pacemaker with leads overlying right atrium and right ventricle. LUNGS AND PLEURA: Right upper lung opacity. Right hilar asymmetric density. Ovoid pleural-based opacity at the right apex corresponding to rib lesion on CT. No pulmonary edema. No pleural effusion. No pneumothorax. HEART AND MEDIASTINUM: No acute abnormality of the cardiac an silhouettes. BONES AND SOFT TISSUES: Known right second and third rib lesions. IMPRESSION: 1. Right upper lung and hilar opacity corresponding to the mass recently demonstrated on CT. 2. Right second and third rib lesions corresponding to metastatic lesions seen on CT Electronically signed by: Luke Bun MD 01/01/2024 06:56 PM EST RP Workstation: [id number]   CUP PACEART INCLINIC DEVICE CHECK Result Date: 01/01/2024 in-clinic _dual__ chamber ICD check. Presenting Rhythm: AF/VS_ . Routine testing was performed. Thresholds, sensing, and impedance demonstrate stable parameters and no programming changes needed. No treated arrhythmias. Estimated longevity __7.5 years__ . Pt enrolled in remote follow-up. AFib started 11/4, overall burden low, though increased of lat A lead impedance on a gradual downtrend though appears to have leveled off, and remains WNL no VT RU  CT CHEST ABDOMEN PELVIS W CONTRAST Result Date: 12/22/2023 EXAM: CT CHEST, ABDOMEN AND PELVIS WITH CONTRAST 12/22/2023 07:48:41 PM TECHNIQUE: CT of the chest, abdomen and pelvis was performed with the administration of 100 mL of iohexol  (OMNIPAQUE ) 300 MG/ML solution. Multiplanar reformatted images are provided for review. Automated exposure control, iterative  reconstruction, and/or weight based adjustment of the mA/kV was utilized to reduce the radiation dose to as low as reasonably achievable. COMPARISON: CT pelvis earlier today CLINICAL HISTORY: Metastatic disease evaluation. Metastatic disease eval, abnormal CT, c/o left hip pain that started 2-3 weeks ago. Pt reports pain radiates into his leg and worse with movement. Pt denies falling or injury. FINDINGS: CHEST: MEDIASTINUM AND LYMPH NODES: Heart and pericardium are unremarkable. Left subclavian icd Moderate coronary atherosclerosis of the lad Thoracic aortic atherosclerosis The central airways are clear. 13 mm short axis right supraclavicular node (image 3). Additional thoracic lymphadenopathy, including a 2.3 cm right hilar node (image 25), and a 1.7 cm short axis subcarinal node (image 29). LUNGS AND PLEURA: 3.0 x 4.0 cm posterior right upper lobe/perihilar mass (image 45)  compatible with primary bronchogenic carcinoma. No pleural effusion or pneumothorax. ABDOMEN AND PELVIS: LIVER: The liver is unremarkable. GALLBLADDER AND BILE DUCTS: Gallbladder is unremarkable. No biliary ductal dilatation. SPLEEN: No acute abnormality. PANCREAS: No acute abnormality. ADRENAL GLANDS: No acute abnormality. KIDNEYS, URETERS AND BLADDER: No stones in the kidneys or ureters. No hydronephrosis. No perinephric or periureteral stranding. Urinary bladder is unremarkable. GI AND BOWEL: Stomach demonstrates no acute abnormality. There is no bowel obstruction. REPRODUCTIVE ORGANS: Mild prostatomegaly. PERITONEUM AND RETROPERITONEUM: 2.5 cm soft tissue lesion beneath the left upper anterior abdominal wall (image 58) suggesting peritoneal metastasis in this clinical context. No ascites. No free air. VASCULATURE: Atherosclerotic calcification of the abdominal aorta. Aortobifemoral bypass graft, patent. ABDOMINAL AND PELVIS LYMPH NODES: No lymphadenopathy. BONES AND SOFT TISSUES: Lytic calcification metastases involving the anterior 2nd and  posterior 3rd ribs (images 11 and 13) measuring up to 4.2 cm. Additional lytic soft tissue metastasis in the right posterior 8th rib. Lytic soft tissue metastasis involving the T9 and T11 vertebral bodies (sagittal image 102) with compression of the spinal canal at T11. Lytic soft tissue metastasis involving the left anterior acetabulum (image 113). Dominant 5.9 x 7.4 cm lytic soft tissue metastasis involving the left hemipelvis, extending from the posterior acetabulum to the inferior pubic ramus (image 123). Additional scattered smaller lytic metastases in the visualized axial and appendicular skeleton. A 2.5 cm soft tissue lesion is noted beneath the left upper anterior abdominal wall (image 58). IMPRESSION: 1. 4.0 cm posterior right upper lobe/perihilar mass, compatible with primary bronchogenic carcinoma. 2. Thoracic nodal metastases, as above. A 13 mm short axis right supraclavicular node would be amenable to percutaneous sampling, as clinically warranted. 3. Suspected isolated peritoneal metastasis in the left upper abdomen. 4. Extensive osseous metastatic disease, as described above, including a T11 lesion narrowing the spinal canal. Consider MR with contrast for further evaluation, as clinically warranted. Electronically signed by: Pinkie Pebbles MD 12/22/2023 08:06 PM EDT RP Workstation: [id number]   CT PELVIS WO CONTRAST Result Date: 12/22/2023 CLINICAL DATA:  Possible lucent lesions on x-ray. Left hip and left thigh pain. No known injury. EXAM: CT PELVIS WITHOUT CONTRAST TECHNIQUE: Multidetector CT imaging of the pelvis was performed following the standard protocol without intravenous contrast. RADIATION DOSE REDUCTION: This exam was performed according to the departmental dose-optimization program which includes automated exposure control, adjustment of the mA and/or kV according to patient size and/or use of iterative reconstruction technique. COMPARISON:  12/22/2023. FINDINGS: Urinary Tract:  No  abnormality visualized. Bowel: Unremarkable visualized pelvic bowel loops. Appendix appears normal. Vascular/Lymphatic: Findings suggestive of aortobifemoral bypass grafts. No pelvic lymphadenopathy by size criteria. Reproductive:  The prostate gland is enlarged. Other:  No ascites. Musculoskeletal: No acute fracture or dislocation is seen. Multiple lytic lesions are seen scattered throughout the bones. There are large destructive lesions with soft tissue masses is involving the sacrum on the right, acetabulum on the left, and inferior pubic ramus on the left. Evaluation of the masses is limited due to lack of IV contrast. However the largest soft tissue mass is seen in the region the inferior pubic ramus on the left measuring 5.8 x 5.5 cm. IMPRESSION: Multiple lytic lesions scattered throughout the bones with large destructive lesionsand associated soft tissue masses involving the sacrum on the right, acetabulum on the left, and inferior pubic ramus on the left. No pathologic fracture is seen. Differential diagnosis includes multiple myeloma/plasmacytoma versus metastatic disease. Electronically Signed   By: Leita Birmingham M.D.   On: 12/22/2023  18:31   DG Femur Min 2 Views Left Result Date: 12/22/2023 EXAM: 2 VIEW(S) XRAY OF THE LEFT FEMUR 12/22/2023 05:53:00 PM COMPARISON: None available. CLINICAL HISTORY: left hip/femur pain. Per triage ;  Pt arrives with c/o left hip pain that started 2-3 weeks ago. Pt reports pain radiates into his leg and worse with movement. Pt denies falling or injury. ; Best lateral obtainable, pt had trouble staying still once in position FINDINGS: BONES AND JOINTS: Left inferior pubic ramus appears fractured and fragmented with some areas of focal lucency. The left femur appears intact. There are mild degenerative changes of the hip and knee. No joint dislocation. SOFT TISSUES: Peripheral vascular calcifications are present. IMPRESSION: 1. Fracture and fragmentation of the left  inferior pubic ramus with focal lucency. 2. Mild degenerative changes of the hip and knee. Electronically signed by: Greig Pique MD 12/22/2023 06:10 PM EDT RP Workstation: [id number]   DG Pelvis 1-2 Views Result Date: 12/22/2023 EXAM: 1 or 2 VIEW(S) XRAY OF THE PELVIS 12/22/2023 04:57:18 PM COMPARISON: None available. CLINICAL HISTORY: Left hip pain. FINDINGS: BONES AND JOINTS: Questionable erosive changes and lucent lesions in the left inferior pubic ramus. No joint dislocation. SOFT TISSUES: Surgical clips overlie the bilateral inguinal regions. Peripheral vascular calcifications are present. STOMACH AND BOWEL: Diffuse gaseous distention of visualized colon. IMPRESSION: 1. Questionable erosive changes and lucent lesions in the left inferior pubic ramus. Recommend further evaluation with CT. 2. Diffuse gaseous distention of visualized colon. Electronically signed by: Greig Pique MD 12/22/2023 05:19 PM EDT RP Workstation: [id number]   (Echo, Carotid, EGD, Colonoscopy, ERCP)    Subjective: No complaints  Discharge Exam: Vitals:   01/03/24 0500 01/03/24 0904  BP: (!) 104/57 (!) 109/53  Pulse: 77 73  Resp:  17  Temp:  (!) 97.2 F (36.2 C)  SpO2: 100% 100%   Vitals:   01/02/24 2027 01/03/24 0058 01/03/24 0500 01/03/24 0904  BP: (!) 87/51 (!) 108/50 (!) 104/57 (!) 109/53  Pulse: 77 78 77 73  Resp: 19 18  17   Temp: 98.2 F (36.8 C) 98.4 F (36.9 C)  (!) 97.2 F (36.2 C)  TempSrc: Oral Oral  Oral  SpO2: 100% 100% 100% 100%  Weight:      Height:        General: Pt is alert, awake, not in acute distress Cardiovascular: RRR, S1/S2 +, no rubs, no gallops Respiratory: CTA bilaterally, no wheezing, no rhonchi Abdominal: Soft, NT, ND, bowel sounds + Extremities: no edema, no cyanosis    The results of significant diagnostics from this hospitalization (including imaging, microbiology, ancillary and laboratory) are listed below for reference.     Microbiology: No results found for  this or any previous visit (from the past 240 hours).   Labs: BNP (last 3 results) Recent Labs    01/01/24 1705  BNP 224.1*   Basic Metabolic Panel: Recent Labs  Lab 01/01/24 1705 01/02/24 1742 01/03/24 0425  NA 137 135 134*  K 4.1 4.3 4.3  CL 100 101 98  CO2 24 27 26   GLUCOSE 117* 86 70  BUN 17 15 17   CREATININE 1.44* 1.34* 1.46*  CALCIUM  11.0* 10.2 10.4*   Liver Function Tests: No results for input(s): AST, ALT, ALKPHOS, BILITOT, PROT, ALBUMIN in the last 168 hours. No results for input(s): LIPASE, AMYLASE in the last 168 hours. No results for input(s): AMMONIA in the last 168 hours. CBC: Recent Labs  Lab 01/01/24 1705 01/02/24 1742 01/03/24 0425  WBC 10.1 9.5 9.6  HGB 10.0* 8.2* 8.2*  HCT 33.2* 26.3* 26.8*  MCV 86.2 83.8 84.5  PLT 380 291 277   Cardiac Enzymes: No results for input(s): CKTOTAL, CKMB, CKMBINDEX, TROPONINI in the last 168 hours. BNP: Invalid input(s): POCBNP CBG: No results for input(s): GLUCAP in the last 168 hours. D-Dimer No results for input(s): DDIMER in the last 72 hours. Hgb A1c No results for input(s): HGBA1C in the last 72 hours. Lipid Profile No results for input(s): CHOL, HDL, LDLCALC, TRIG, CHOLHDL, LDLDIRECT in the last 72 hours. Thyroid  function studies No results for input(s): TSH, T4TOTAL, T3FREE, THYROIDAB in the last 72 hours.  Invalid input(s): FREET3 Anemia work up No results for input(s): VITAMINB12, FOLATE, FERRITIN, TIBC, IRON, RETICCTPCT in the last 72 hours. Urinalysis    Component Value Date/Time   COLORURINE YELLOW 04/30/2020 0109   APPEARANCEUR CLEAR 04/30/2020 0109   LABSPEC 1.015 04/30/2020 0109   PHURINE 5.0 04/30/2020 0109   GLUCOSEU NEGATIVE 04/30/2020 0109   HGBUR NEGATIVE 04/30/2020 0109   BILIRUBINUR NEGATIVE 04/30/2020 0109   KETONESUR NEGATIVE 04/30/2020 0109   PROTEINUR NEGATIVE 04/30/2020 0109   NITRITE NEGATIVE 04/30/2020  0109   LEUKOCYTESUR SMALL (A) 04/30/2020 0109   Sepsis Labs Recent Labs  Lab 01/01/24 1705 01/02/24 1742 01/03/24 0425  WBC 10.1 9.5 9.6   Microbiology No results found for this or any previous visit (from the past 240 hours).   Time coordinating discharge: Over 35 minutes  SIGNED:   Erle Odell Castor, MD  [facility] 01/03/2024, 9:55 AM Pager   If 7PM-7AM, please contact night-coverage [email or web address] Password [email or web address]

## 2024-01-03 NOTE — Progress Notes (Signed)
 PHARMACY - ANTICOAGULATION CONSULT NOTE  Pharmacy Consult for heparin  Indication: CP/Afib  Labs: Recent Labs    01/01/24 1705 01/01/24 2306 01/02/24 1742 01/03/24 0425  HGB 10.0*  --  8.2* 8.2*  HCT 33.2*  --  26.3* 26.8*  PLT 380  --  291 277  APTT  --   --  42* 65*  HEPARINUNFRC  --   --  0.12* 0.25*  CREATININE 1.44*  --  1.34* 1.46*  TROPONINIHS 13 14  --   --    Assessment: 78yo male subtherapeutic on heparin  after rate change; no infusion issues or signs of bleeding per RN.  Goal of Therapy:  Heparin  level 0.3-0.7 units/ml   Plan:  Increase heparin  infusion by 2 units/kg/hr to 1100 units/hr. Check level in 8 hours.   Marvetta Dauphin, PharmD, BCPS 01/03/2024 6:09 AM

## 2024-01-03 NOTE — Progress Notes (Signed)
 Progress Note  Patient Name: Peter Lambert Date of Encounter: 01/03/2024  Primary Cardiologist: Dorn Lesches, MD   Subjective   Patient seen and examined at his bedside. Son at bedside.   Inpatient Medications    Scheduled Meds:  aspirin   81 mg Oral Pre-Cath   aspirin  EC  81 mg Oral Daily   atorvastatin   80 mg Oral Daily   carvedilol   3.125 mg Oral BID WC   docusate sodium   100 mg Oral BID   furosemide   20 mg Oral Daily   isosorbide mononitrate  30 mg Oral Daily   losartan   25 mg Oral Daily   polyethylene glycol  17 g Oral BID   [START ON 01/04/2024] polyethylene glycol  17 g Oral Daily   senna  1 tablet Oral QHS   spironolactone   50 mg Oral Daily   Continuous Infusions:  sodium chloride  10 mL/hr at 01/03/24 0700   heparin  1,100 Units/hr (01/03/24 0700)   PRN Meds:    Vital Signs    Vitals:   01/02/24 1820 01/02/24 2027 01/03/24 0058 01/03/24 0500  BP: (!) 94/49 (!) 87/51 (!) 108/50 (!) 104/57  Pulse: 84 77 78 77  Resp:  19 18   Temp:  98.2 F (36.8 C) 98.4 F (36.9 C)   TempSrc:  Oral Oral   SpO2: 100% 100% 100% 100%  Weight:      Height:        Intake/Output Summary (Last 24 hours) at 01/03/2024 0901 Last data filed at 01/03/2024 0700 Gross per 24 hour  Intake 405.65 ml  Output 350 ml  Net 55.65 ml   Filed Weights   01/01/24 1656 01/02/24 1302  Weight: 66.2 kg 63.5 kg    Telemetry    Sinus rhythm - Personally Reviewed  ECG     - Personally Reviewed  Physical Exam    General: Comfortable Head: Atraumatic, normal size  Eyes: PEERLA, EOMI  Neck: Supple, normal JVD Cardiac: Normal S1, S2; RRR; no murmurs, rubs, or gallops Lungs: Clear to auscultation bilaterally Abd: Soft, nontender, no hepatomegaly  Ext: warm, no edema Musculoskeletal: No deformities, BUE and BLE strength normal and equal Skin: Warm and dry, no rashes   Neuro: Alert and oriented to person, place, time, and situation, CNII-XII grossly intact, no focal deficits   Psych: Normal mood and affect   Labs    Chemistry Recent Labs  Lab 01/01/24 1705 01/02/24 1742 01/03/24 0425  NA 137 135 134*  K 4.1 4.3 4.3  CL 100 101 98  CO2 24 27 26   GLUCOSE 117* 86 70  BUN 17 15 17   CREATININE 1.44* 1.34* 1.46*  CALCIUM  11.0* 10.2 10.4*  GFRNONAA 49* 54* 48*  ANIONGAP 13 7 10      Hematology Recent Labs  Lab 01/01/24 1705 01/02/24 1742 01/03/24 0425  WBC 10.1 9.5 9.6  RBC 3.85* 3.14* 3.17*  HGB 10.0* 8.2* 8.2*  HCT 33.2* 26.3* 26.8*  MCV 86.2 83.8 84.5  MCH 26.0 26.1 25.9*  MCHC 30.1 31.2 30.6  RDW 14.6 14.6 14.6  PLT 380 291 277    Cardiac EnzymesNo results for input(s): TROPONINI in the last 168 hours. No results for input(s): TROPIPOC in the last 168 hours.   BNP Recent Labs  Lab 01/01/24 1705  BNP 224.1*     DDimer No results for input(s): DDIMER in the last 168 hours.   Radiology    ECHOCARDIOGRAM COMPLETE Result Date: 01/02/2024    ECHOCARDIOGRAM REPORT  Patient Name:   Peter Lambert Date of Exam: 01/02/2024 Medical Rec #:  [id number]       Height:       68.0 in Accession #:    [id number]      Weight:       146.0 lb Date of Birth:  10/25/1945      BSA:          1.788 m Patient Age:    78 years        BP:           114/67 mmHg Patient Gender: M               HR:           95 bpm. Exam Location:  Inpatient Procedure: 2D Echo, Cardiac Doppler and Color Doppler (Both Spectral and Color            Flow Doppler were utilized during procedure). Indications:    Chest Pain R07.9  History:        Patient has prior history of Echocardiogram examinations, most                 recent 08/30/2019. CHF, CAD, Pacemaker, COPD, Arrythmias:PVC and                 NSVT; Risk Factors:Hyperlipidemia, Diabetes and Hypertension.  Sonographer:    BERNARDA ROCKS Referring Phys: [id number] SARA-MAIZ A THOMAS IMPRESSIONS  1. Left ventricular ejection fraction, by estimation, is 60 to 65%. The left ventricle has normal function. Left ventricular endocardial  border not optimally defined to evaluate regional wall motion. Left ventricular diastolic parameters are indeterminate.  2. Right ventricular systolic function is normal. The right ventricular size is normal.  3. The mitral valve is abnormal. Mild mitral valve regurgitation.  4. The aortic valve was not well visualized. Aortic valve regurgitation is not visualized.  5. The inferior vena cava is dilated in size with <50% respiratory variability, suggesting right atrial pressure of 15 mmHg. Conclusion(s)/Recommendation(s): Technically very limited study due to poor sound wave transmission. Many structures not seen well. FINDINGS  Left Ventricle: Left ventricular ejection fraction, by estimation, is 60 to 65%. The left ventricle has normal function. Left ventricular endocardial border not optimally defined to evaluate regional wall motion. The left ventricular internal cavity size was normal in size. Suboptimal image quality limits for assessment of left ventricular hypertrophy. Left ventricular diastolic parameters are indeterminate. Right Ventricle: The right ventricular size is normal. No increase in right ventricular wall thickness. Right ventricular systolic function is normal. Left Atrium: Left atrial size was normal in size. Right Atrium: Right atrial size was normal in size. Pericardium: There is no evidence of pericardial effusion. Mitral Valve: The mitral valve is abnormal. There is mild thickening of the mitral valve leaflet(s). Mild mitral valve regurgitation. MV peak gradient, 2.9 mmHg. The mean mitral valve gradient is 1.0 mmHg. Tricuspid Valve: The tricuspid valve is not well visualized. Tricuspid valve regurgitation is not demonstrated. Aortic Valve: The aortic valve was not well visualized. Aortic valve regurgitation is not visualized. Aortic valve mean gradient measures 1.0 mmHg. Aortic valve peak gradient measures 1.6 mmHg. Aortic valve area, by VTI measures 4.13 cm. Pulmonic Valve: The pulmonic  valve was not well visualized. Pulmonic valve regurgitation is not visualized. Aorta: The aortic root is normal in size and structure. Venous: The inferior vena cava is dilated in size with less than 50% respiratory variability, suggesting right atrial pressure of 15 mmHg. IAS/Shunts:  The interatrial septum was not well visualized. Additional Comments: A device lead is visualized.  LEFT VENTRICLE PLAX 2D LVIDd:         4.80 cm      Diastology LVIDs:         3.20 cm      LV e' medial:    5.98 cm/s LV PW:         0.90 cm      LV E/e' medial:  15.6 LV IVS:        1.00 cm      LV e' lateral:   13.20 cm/s LVOT diam:     2.10 cm      LV E/e' lateral: 7.1 LV SV:         40 LV SV Index:   22 LVOT Area:     3.46 cm  LV Volumes (MOD) LV vol d, MOD A2C: 99.7 ml LV vol d, MOD A4C: 159.0 ml LV vol s, MOD A2C: 37.6 ml LV vol s, MOD A4C: 54.4 ml LV SV MOD A2C:     62.1 ml LV SV MOD A4C:     159.0 ml LV SV MOD BP:      80.6 ml RIGHT VENTRICLE             IVC RV Basal diam:  3.50 cm     IVC diam: 2.10 cm RV S prime:     11.00 cm/s TAPSE (M-mode): 2.5 cm LEFT ATRIUM             Index        RIGHT ATRIUM           Index LA diam:        3.70 cm 2.07 cm/m   RA Area:     15.10 cm LA Vol (A2C):   26.9 ml 15.05 ml/m  RA Volume:   38.70 ml  21.65 ml/m LA Vol (A4C):   40.8 ml 22.82 ml/m LA Biplane Vol: 36.2 ml 20.25 ml/m  AORTIC VALVE                    PULMONIC VALVE AV Area (Vmax):    3.55 cm     PV Vmax:       0.68 m/s AV Area (Vmean):   3.30 cm     PV Peak grad:  1.8 mmHg AV Area (VTI):     4.13 cm AV Vmax:           64.10 cm/s AV Vmean:          41.400 cm/s AV VTI:            0.097 m AV Peak Grad:      1.6 mmHg AV Mean Grad:      1.0 mmHg LVOT Vmax:         65.70 cm/s LVOT Vmean:        39.500 cm/s LVOT VTI:          0.116 m LVOT/AV VTI ratio: 1.19  AORTA Ao Root diam: 3.40 cm Ao Asc diam:  3.50 cm MITRAL VALVE MV Area (PHT): 4.71 cm    SHUNTS MV Area VTI:   2.59 cm    Systemic VTI:  0.12 m MV Peak grad:  2.9 mmHg     Systemic Diam: 2.10 cm MV Mean grad:  1.0 mmHg MV Vmax:       0.84 m/s MV Vmean:      43.0 cm/s MV Decel Time:  161 msec MV E velocity: 93.30 cm/s MV A velocity: 46.70 cm/s MV E/A ratio:  2.00 Toribio Fuel MD Electronically signed by Toribio Fuel MD Signature Date/Time: 01/02/2024/11:52:55 PM    Final    CT ABDOMEN PELVIS W CONTRAST Result Date: 01/01/2024 EXAM: CT ABDOMEN AND PELVIS WITH CONTRAST 01/01/2024 07:13:00 PM TECHNIQUE: CT of the abdomen and pelvis was performed with the administration of 50 mL of iohexol  (OMNIPAQUE ) 350 MG/ML injection. Multiplanar reformatted images are provided for review. Automated exposure control, iterative reconstruction, and/or weight-based adjustment of the mA/kV was utilized to reduce the radiation dose to as low as reasonably achievable. COMPARISON: 12/22/2023 CLINICAL HISTORY: Bowel obstruction suspected. FINDINGS: LOWER CHEST: See chest CT report today. LIVER: The liver is unremarkable. GALLBLADDER AND BILE DUCTS: Gallbladder is unremarkable. No biliary ductal dilatation. SPLEEN: No acute abnormality. PANCREAS: No acute abnormality. ADRENAL GLANDS: No acute abnormality. KIDNEYS, URETERS AND BLADDER: No stones in the kidneys or ureters. No hydronephrosis. No perinephric or periureteral stranding. Urinary bladder is unremarkable. GI AND BOWEL: Gaseous distention of the bowel involves both large and small bowel; however, distal small bowel loops are decompressed. While I favor this most likely reflects ileus, it is difficult to completely exclude distal small bowel obstruction. Moderate stool burden throughout the colon. Normal appendix. PERITONEUM AND RETROPERITONEUM: No ascites. No free air. VASCULATURE: Aorta is normal in caliber. Changes of prior aortobifemoral bypass. LYMPH NODES: No lymphadenopathy. REPRODUCTIVE ORGANS: Prostate enlargement. 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 No  focal soft tissue abnormality. IMPRESSION: 1. Gaseous distention of bowel involving both large and small bowel with decompressed distal small bowel loops, most likely ileus; distal small bowel obstruction cannot be completely excluded. 2. 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 Electronically signed by: Franky Crease MD 01/01/2024 07:31 PM EST RP Workstation: [id number]   CT Angio Chest PE W and/or Wo Contrast Result Date: 01/01/2024 EXAM: CTA of the Chest without and with contrast for PE 01/01/2024 07:13:00 PM TECHNIQUE: CTA of the chest was performed without and with the administration of 50 mL of iohexol  (OMNIPAQUE ) 350 MG/ML injection. Multiplanar reformatted images are provided for review. MIP images are provided for review. Automated exposure control, iterative reconstruction, and/or weight based adjustment of the mA/kV was utilized to reduce the radiation dose to as low as reasonably achievable. COMPARISON: 12/22/2023 CLINICAL HISTORY: Pulmonary embolism (PE) suspected, high prob. FINDINGS: PULMONARY ARTERIES: Pulmonary arteries are adequately opacified for evaluation. No pulmonary embolism. Main pulmonary artery is normal in caliber. MEDIASTINUM: The heart and pericardium demonstrate no acute abnormality. There is no acute abnormality of the thoracic aorta. Right hilar and mediastinal adenopathy again noted, stable. LYMPH NODES: Right hilar and mediastinal adenopathy again noted, stable. No axillary lymphadenopathy. LUNGS AND PLEURA: Right upper lobe mass measures 3 x 3 cm on image 58. No focal consolidation or pulmonary edema. No pleural effusion or pneumothorax. UPPER ABDOMEN: Limited images of the upper abdomen are unremarkable. SOFT TISSUES AND BONES: Numerous lytic bony metastases throughout the ribs with soft tissue masses are unchanged. Stable lytic metastases throughout the thoracic spine. IMPRESSION: 1. No evidence of pulmonary embolism. 2.  Stable right upper lobe mass with unchanged right hilar and mediastinal adenopathy and diffuse lytic osseous metastases in the ribs and thoracic spine. Electronically signed by: Franky Crease MD 01/01/2024 07:26 PM EST RP Workstation: [id number]   DG Chest 2 View Result Date: 01/01/2024 EXAM: 2 VIEW(S) XRAY OF THE CHEST  01/01/2024 06:36:16 PM COMPARISON: None available. CLINICAL HISTORY: chest pain FINDINGS: LINES, TUBES AND DEVICES: Left chest pacemaker with leads overlying right atrium and right ventricle. LUNGS AND PLEURA: Right upper lung opacity. Right hilar asymmetric density. Ovoid pleural-based opacity at the right apex corresponding to rib lesion on CT. No pulmonary edema. No pleural effusion. No pneumothorax. HEART AND MEDIASTINUM: No acute abnormality of the cardiac an silhouettes. BONES AND SOFT TISSUES: Known right second and third rib lesions. IMPRESSION: 1. Right upper lung and hilar opacity corresponding to the mass recently demonstrated on CT. 2. Right second and third rib lesions corresponding to metastatic lesions seen on CT Electronically signed by: Luke Bun MD 01/01/2024 06:56 PM EST RP Workstation: [id number]   CUP PACEART INCLINIC DEVICE CHECK Result Date: 01/01/2024 in-clinic _dual__ chamber ICD check. Presenting Rhythm: AF/VS_ . Routine testing was performed. Thresholds, sensing, and impedance demonstrate stable parameters and no programming changes needed. No treated arrhythmias. Estimated longevity __7.5 years__ . Pt enrolled in remote follow-up. AFib started 11/4, overall burden low, though increased of lat A lead impedance on a gradual downtrend though appears to have leveled off, and remains WNL no VT RU   Cardiac Studies     Patient Profile     78 y.o. male with coronary artery disease and atypical chest pain  Assessment & Plan    Coronary artery disease History of V-fib arrest status post ICD Persistent atrial fibrillation Heart failure with improved ejection  fraction  He presented with atypical chest discomfort, started patient on antianginal with Imdur yesterday he reports that his chest pain has improved significantly and has resolved.  His son is by the bedside at this time.  Echocardiogram which was done during this admission showed no wall motion abnormalities.  At this juncture given his improvement with antianginals we will not be proceeding with ischemic evaluation at this time.  He can proceed with his bronchoscopy on Tuesday.  The patient does not have any unstable cardiac conditions.  Upon evaluation today, he can achieve 4 METs or greater without anginal symptoms.  According to [facility] and AHA guidelines, he requires no further cardiac workup prior to his noncardiac surgery and should be at intermediate to high risk for major adverse cardiovascular event during or postprocedure given his cardiovascular history.  I shared this with the patient and his son.  Our service is available as necessary in the perioperative period.  Hyperlipidemia - continue with current statin medication.  Recent echocardiogram show improve EF with no evidence of clinically fluid overload.  Continue his current medication regimen for GDMT.  For questions or updates, please contact [facility] Please consult [email or web address] for contact info under Cardiology/STEMI.      Signed, Chizara Mena, DO  01/03/2024, 9:01 AM

## 2024-01-03 NOTE — Telephone Encounter (Signed)
 Noted. NFN

## 2024-01-03 NOTE — Plan of Care (Signed)
  Problem: Clinical Measurements: Goal: Ability to maintain clinical measurements within normal limits will improve Outcome: Progressing Goal: Diagnostic test results will improve Outcome: Progressing Goal: Cardiovascular complication will be avoided Outcome: Progressing   Problem: Pain Managment: Goal: General experience of comfort will improve and/or be controlled Outcome: Progressing

## 2024-01-03 NOTE — TOC CM/SW Note (Signed)
 Transition of Care [facility]) - Inpatient Brief Assessment   Patient Details  Name: Peter Lambert MRN: [id number] Date of Birth: 1945/12/26  Transition of [facility] CM/SW Contact:    Sudie Erminio Deems, RN Phone Number: 01/03/2024, 11:03 AM   Clinical Narrative: Patient presented for chest pain. PTA patient was from home alone. Patient has support of sons and they check in often. Patient states he has oxygen  in the home-unable to state the agency. ICM spoke with son and he will bring a portable tank to the hospital for transport home. Patient has PCP and insurance. No home needs identified at this time.    Transition of Care Asessment: Insurance and Status: Insurance coverage has been reviewed Patient has primary care physician: Yes Home environment has been reviewed: reviewed Prior level of function:: independent Prior/Current Home Services: No current home services Social Drivers of Health Review: SDOH reviewed no interventions necessary Readmission risk has been reviewed: Yes Transition of care needs: no transition of care needs at this time

## 2024-01-03 NOTE — Telephone Encounter (Signed)
 Noted. Nothing further needed.

## 2024-01-06 ENCOUNTER — Other Ambulatory Visit: Payer: Self-pay

## 2024-01-06 ENCOUNTER — Encounter
Admit: 2024-01-06 | Discharge: 2024-01-06 | Disposition: A | Discharge status: Home / Self Care | Attending: Pulmonary Disease | Admitting: Pulmonary Disease

## 2024-01-06 DIAGNOSIS — Z01818 Encounter for other preprocedural examination: Secondary | ICD-10-CM

## 2024-01-06 HISTORY — DX: Presence of coronary angioplasty implant and graft: Z95.5

## 2024-01-06 HISTORY — DX: Anemia, unspecified: D64.9

## 2024-01-06 HISTORY — DX: Long term (current) use of anticoagulants: Z79.01

## 2024-01-06 HISTORY — DX: Chronic atrial fibrillation, unspecified: I48.20

## 2024-01-06 HISTORY — DX: Dyspnea, unspecified: R06.00

## 2024-01-06 HISTORY — DX: Gastro-esophageal reflux disease without esophagitis: K21.9

## 2024-01-06 HISTORY — DX: Heart failure, unspecified: I50.9

## 2024-01-06 HISTORY — DX: Presence of automatic (implantable) cardiac defibrillator: Z95.810

## 2024-01-06 HISTORY — DX: Localized enlarged lymph nodes: R59.0

## 2024-01-06 NOTE — Patient Instructions (Signed)
 Your procedure is scheduled on:01-07-24 Tuesday Report to the Registration Desk on the 1st floor of the Medical Mall.Proceed to the 2nd floor Surgery Desk. Arrive at 7:45 am   REMEMBER: Instructions that are not followed completely may result in serious medical risk, up to and including death; or upon the discretion of your surgeon and anesthesiologist your surgery may need to be rescheduled.  Do not eat food OR drink liquids after midnight the night before surgery.  No gum chewing or hard candies.  One week prior to surgery: Stop Anti-inflammatories (NSAIDS) such as Advil, Aleve, Ibuprofen, Motrin, Naproxen, Naprosyn and Aspirin  based products such as Excedrin, Goody's Powder, BC Powder  You may however, continue to take Tylenol  if needed for pain up until the day of surgery  Continue taking all of your other prescription medications up until the day of surgery.  ON THE DAY OF SURGERY ONLY TAKE THESE MEDICATIONS WITH SIPS OF WATER : -carvedilol  (COREG )  -omeprazole  (PRILOSEC)  -potassium chloride  SA (KLOR-CON )   Last dose of Aspirin  and apixaban  (ELIQUIS ) were on 01-04-24 Saturday  No Alcohol for 24 hours before or after surgery.  No Smoking including e-cigarettes for 24 hours before surgery.  No chewable tobacco products for at least 6 hours before surgery.  No nicotine  patches on the day of surgery.  Do not use any recreational drugs for at least a week (preferably 2 weeks) before your surgery.  Please be advised that the combination of cocaine and anesthesia may have negative outcomes, up to and including death. If you test positive for cocaine, your surgery will be cancelled.  On the morning of surgery brush your teeth with toothpaste and water , you may rinse your mouth with mouthwash if you wish. Do not swallow any toothpaste or mouthwash.  Do not wear jewelry, make-up, hairpins, clips or nail polish.  For welded (permanent) jewelry: bracelets, anklets, waist bands, etc.   Please have this removed prior to surgery.  If it is not removed, there is a chance that hospital personnel will need to cut it off on the day of surgery.  Do not wear lotions, powders, or perfumes.   Do not shave body hair from the neck down 48 hours before surgery.  Contact lenses, hearing aids and dentures may not be worn into surgery.  Do not bring valuables to the hospital. [facility] is not responsible for any missing/lost belongings or valuables.   Notify your doctor if there is any change in your medical condition (cold, fever, infection).  Wear comfortable clothing (specific to your surgery type) to the hospital.  After surgery, you can help prevent lung complications by doing breathing exercises.  Take deep breaths and cough every 1-2 hours. Your doctor may order a device called an Incentive Spirometer to help you take deep breaths. When coughing or sneezing, hold a pillow firmly against your incision with both hands. This is called "splinting." Doing this helps protect your incision. It also decreases belly discomfort.  If you are being admitted to the hospital overnight, leave your suitcase in the car. After surgery it may be brought to your room.  In case of increased patient census, it may be necessary for you, the patient, to continue your postoperative care in the Same Day Surgery department.  If you are being discharged the day of surgery, you will not be allowed to drive home. You will need a responsible individual to drive you home and stay with you for 24 hours after surgery.   If  you are taking public transportation, you will need to have a responsible individual with you.  Please call the Pre-admissions Testing Dept. at [phone] if you have any questions about these instructions.  Surgery Visitation Policy:  Patients having surgery or a procedure may have two visitors.  Children under the age of 44 must have an adult with them who is not the  patient.   Merchandiser, Retail to address health-related social needs:  [email or web address]

## 2024-01-06 NOTE — Progress Notes (Signed)
 Went over surgery instructions with pt but he did not have an email or my chart so I was unable to send him instructions. Pt told me to call his son [name]. Called Darrell, went over surgery instructions and got his email and emailed his dads surgery instructions. Darrell verbalized understanding of all instructions

## 2024-01-07 ENCOUNTER — Ambulatory Visit
Admission: RE | Admit: 2024-01-07 | Discharge: 2024-01-07 | Disposition: A | Discharge status: Home / Self Care | Attending: Pulmonary Disease | Admitting: Pulmonary Disease

## 2024-01-07 ENCOUNTER — Encounter: Payer: Self-pay | Admitting: Pulmonary Disease

## 2024-01-07 ENCOUNTER — Other Ambulatory Visit: Payer: Self-pay

## 2024-01-07 ENCOUNTER — Ambulatory Visit

## 2024-01-07 ENCOUNTER — Ambulatory Visit: Payer: Self-pay

## 2024-01-07 ENCOUNTER — Encounter
Admission: RE | Disposition: A | Discharge status: Home / Self Care | Payer: Self-pay | Source: Home / Self Care | Attending: Pulmonary Disease

## 2024-01-07 DIAGNOSIS — I130 Hypertensive heart and chronic kidney disease with heart failure and stage 1 through stage 4 chronic kidney disease, or unspecified chronic kidney disease: Secondary | ICD-10-CM | POA: Diagnosis not present

## 2024-01-07 DIAGNOSIS — I110 Hypertensive heart disease with heart failure: Secondary | ICD-10-CM | POA: Diagnosis not present

## 2024-01-07 DIAGNOSIS — C3411 Malignant neoplasm of upper lobe, right bronchus or lung: Secondary | ICD-10-CM | POA: Insufficient documentation

## 2024-01-07 DIAGNOSIS — E785 Hyperlipidemia, unspecified: Secondary | ICD-10-CM | POA: Diagnosis not present

## 2024-01-07 DIAGNOSIS — Z01818 Encounter for other preprocedural examination: Secondary | ICD-10-CM

## 2024-01-07 DIAGNOSIS — E1151 Type 2 diabetes mellitus with diabetic peripheral angiopathy without gangrene: Secondary | ICD-10-CM | POA: Insufficient documentation

## 2024-01-07 DIAGNOSIS — K219 Gastro-esophageal reflux disease without esophagitis: Secondary | ICD-10-CM | POA: Diagnosis not present

## 2024-01-07 DIAGNOSIS — C771 Secondary and unspecified malignant neoplasm of intrathoracic lymph nodes: Secondary | ICD-10-CM | POA: Insufficient documentation

## 2024-01-07 DIAGNOSIS — R918 Other nonspecific abnormal finding of lung field: Secondary | ICD-10-CM | POA: Diagnosis not present

## 2024-01-07 DIAGNOSIS — Z955 Presence of coronary angioplasty implant and graft: Secondary | ICD-10-CM | POA: Insufficient documentation

## 2024-01-07 DIAGNOSIS — I252 Old myocardial infarction: Secondary | ICD-10-CM | POA: Diagnosis not present

## 2024-01-07 DIAGNOSIS — I482 Chronic atrial fibrillation, unspecified: Secondary | ICD-10-CM | POA: Diagnosis not present

## 2024-01-07 DIAGNOSIS — I509 Heart failure, unspecified: Secondary | ICD-10-CM | POA: Insufficient documentation

## 2024-01-07 DIAGNOSIS — I2510 Atherosclerotic heart disease of native coronary artery without angina pectoris: Secondary | ICD-10-CM | POA: Diagnosis not present

## 2024-01-07 DIAGNOSIS — Z87891 Personal history of nicotine dependence: Secondary | ICD-10-CM | POA: Diagnosis not present

## 2024-01-07 DIAGNOSIS — E1122 Type 2 diabetes mellitus with diabetic chronic kidney disease: Secondary | ICD-10-CM | POA: Diagnosis not present

## 2024-01-07 DIAGNOSIS — N1832 Chronic kidney disease, stage 3b: Secondary | ICD-10-CM | POA: Diagnosis not present

## 2024-01-07 DIAGNOSIS — J449 Chronic obstructive pulmonary disease, unspecified: Secondary | ICD-10-CM | POA: Insufficient documentation

## 2024-01-07 HISTORY — PX: ENDOBRONCHIAL ULTRASOUND: SHX5096

## 2024-01-07 SURGERY — ENDOBRONCHIAL ULTRASOUND (EBUS)
Anesthesia: General | Laterality: Bilateral

## 2024-01-07 MED ORDER — LIDOCAINE HCL (PF) 2 % IJ SOLN
INTRAMUSCULAR | Status: AC
  Filled 2024-01-07: qty 5

## 2024-01-07 MED ORDER — PROPOFOL 10 MG/ML IV BOLUS
INTRAVENOUS | Status: DC | PRN
  Administered 2024-01-07: 100 mg via INTRAVENOUS

## 2024-01-07 MED ORDER — SODIUM CHLORIDE 0.9 % IV SOLN: INTRAVENOUS | Status: DC

## 2024-01-07 MED ORDER — ROCURONIUM BROMIDE 10 MG/ML (PF) SYRINGE
PREFILLED_SYRINGE | INTRAVENOUS | Status: AC
Start: 2024-01-07 — End: 2024-01-07
  Filled 2024-01-07: qty 10

## 2024-01-07 MED ORDER — VASOPRESSIN 20 UNIT/ML IV SOLN
INTRAVENOUS | Status: DC | PRN
  Administered 2024-01-07 (×3): 1 [IU] via INTRAVENOUS

## 2024-01-07 MED ORDER — FENTANYL CITRATE (PF) 100 MCG/2ML IJ SOLN
INTRAMUSCULAR | Status: AC
  Filled 2024-01-07: qty 2

## 2024-01-07 MED ORDER — GLYCOPYRROLATE 0.2 MG/ML IJ SOLN
INTRAMUSCULAR | Status: AC
  Filled 2024-01-07: qty 1

## 2024-01-07 MED ORDER — LACTATED RINGERS IV SOLN: INTRAVENOUS | Status: DC

## 2024-01-07 MED ORDER — PHENYLEPHRINE 80 MCG/ML (10ML) SYRINGE FOR IV PUSH (FOR BLOOD PRESSURE SUPPORT)
PREFILLED_SYRINGE | INTRAVENOUS | Status: DC | PRN
  Administered 2024-01-07 (×2): 80 ug via INTRAVENOUS
  Administered 2024-01-07 (×2): 160 ug via INTRAVENOUS

## 2024-01-07 MED ORDER — FENTANYL CITRATE (PF) 100 MCG/2ML IJ SOLN
INTRAMUSCULAR | Status: DC | PRN
  Administered 2024-01-07: 50 ug via INTRAVENOUS

## 2024-01-07 MED ORDER — ONDANSETRON HCL 4 MG/2ML IJ SOLN
INTRAMUSCULAR | Status: AC
  Filled 2024-01-07: qty 2

## 2024-01-07 MED ORDER — IPRATROPIUM-ALBUTEROL 0.5-2.5 (3) MG/3ML IN SOLN
RESPIRATORY_TRACT | Status: AC
  Filled 2024-01-07: qty 3

## 2024-01-07 MED ORDER — ORAL CARE MOUTH RINSE: 15.0000 mL | Freq: Once | OROMUCOSAL | Status: AC

## 2024-01-07 MED ORDER — VASOPRESSIN 20 UNIT/ML IV SOLN
INTRAVENOUS | Status: AC
  Filled 2024-01-07: qty 1

## 2024-01-07 MED ORDER — GLYCOPYRROLATE 0.2 MG/ML IJ SOLN
INTRAMUSCULAR | Status: DC | PRN
  Administered 2024-01-07: .4 mg via INTRAVENOUS

## 2024-01-07 MED ORDER — METOPROLOL TARTRATE 5 MG/5ML IV SOLN
INTRAVENOUS | Status: AC
  Filled 2024-01-07: qty 5

## 2024-01-07 MED ORDER — ROCURONIUM BROMIDE 10 MG/ML (PF) SYRINGE
PREFILLED_SYRINGE | INTRAVENOUS | Status: AC
  Filled 2024-01-07: qty 10

## 2024-01-07 MED ORDER — ONDANSETRON HCL 4 MG/2ML IJ SOLN
INTRAMUSCULAR | Status: DC | PRN
Start: 2024-01-07 — End: 2024-01-07
  Administered 2024-01-07: 4 mg via INTRAVENOUS

## 2024-01-07 MED ORDER — CHLORHEXIDINE GLUCONATE 0.12 % MT SOLN
OROMUCOSAL | Status: AC
  Filled 2024-01-07: qty 15

## 2024-01-07 MED ORDER — NEOSTIGMINE METHYLSULFATE 10 MG/10ML IV SOLN
INTRAVENOUS | Status: DC | PRN
  Administered 2024-01-07: 3 mg via INTRAVENOUS

## 2024-01-07 MED ORDER — PROPOFOL 10 MG/ML IV BOLUS
INTRAVENOUS | Status: AC
  Filled 2024-01-07: qty 20

## 2024-01-07 MED ORDER — EPHEDRINE 5 MG/ML INJ
INTRAVENOUS | Status: AC
Start: 2024-01-07 — End: 2024-01-07
  Filled 2024-01-07: qty 5

## 2024-01-07 MED ORDER — PHENYLEPHRINE 80 MCG/ML (10ML) SYRINGE FOR IV PUSH (FOR BLOOD PRESSURE SUPPORT)
PREFILLED_SYRINGE | INTRAVENOUS | Status: AC
  Filled 2024-01-07: qty 10

## 2024-01-07 MED ORDER — CHLORHEXIDINE GLUCONATE 0.12 % MT SOLN
15.0000 mL | Freq: Once | OROMUCOSAL | Status: AC
  Administered 2024-01-07: 15 mL via OROMUCOSAL

## 2024-01-07 MED ORDER — ONDANSETRON HCL 4 MG/2ML IJ SOLN
INTRAMUSCULAR | Status: AC
Start: 2024-01-07 — End: 2024-01-07
  Filled 2024-01-07: qty 2

## 2024-01-07 MED ORDER — PHENYLEPHRINE HCL-NACL 20-0.9 MG/250ML-% IV SOLN
INTRAVENOUS | Status: DC | PRN
  Administered 2024-01-07: 50 ug/min via INTRAVENOUS

## 2024-01-07 MED ORDER — DEXAMETHASONE SOD PHOSPHATE PF 10 MG/ML IJ SOLN
INTRAMUSCULAR | Status: DC | PRN
  Administered 2024-01-07: 10 mg via INTRAVENOUS

## 2024-01-07 MED ORDER — NEOSTIGMINE METHYLSULFATE 10 MG/10ML IV SOLN
INTRAVENOUS | Status: AC
  Filled 2024-01-07: qty 1

## 2024-01-07 MED ORDER — ROCURONIUM BROMIDE 100 MG/10ML IV SOLN
INTRAVENOUS | Status: DC | PRN
  Administered 2024-01-07: 50 mg via INTRAVENOUS

## 2024-01-07 MED ORDER — PHENYLEPHRINE HCL-NACL 20-0.9 MG/250ML-% IV SOLN
INTRAVENOUS | Status: AC
Start: 2024-01-07 — End: 2024-01-07
  Filled 2024-01-07: qty 250

## 2024-01-07 MED ORDER — METOPROLOL TARTRATE 5 MG/5ML IV SOLN
INTRAVENOUS | Status: DC | PRN
  Administered 2024-01-07 (×2): 2.5 mg via INTRAVENOUS

## 2024-01-07 MED ORDER — LIDOCAINE HCL (CARDIAC) PF 100 MG/5ML IV SOSY
PREFILLED_SYRINGE | INTRAVENOUS | Status: DC | PRN
  Administered 2024-01-07: 60 mg via INTRAVENOUS

## 2024-01-07 NOTE — Anesthesia Postprocedure Evaluation (Signed)
 Anesthesia Post Note  Patient: Peter Lambert  Procedure(s) Performed: ENDOBRONCHIAL ULTRASOUND (EBUS) (Bilateral)  Patient location during evaluation: PACU Anesthesia Type: General Level of consciousness: awake and alert Pain management: pain level controlled Vital Signs Assessment: post-procedure vital signs reviewed and stable Respiratory status: spontaneous breathing, nonlabored ventilation and respiratory function stable Cardiovascular status: blood pressure returned to baseline and stable Postop Assessment: no apparent nausea or vomiting Anesthetic complications: no   No notable events documented.   Last Vitals:  Vitals:   01/07/24 1845 01/07/24 1945  BP: (!) 104/52 113/70  Pulse:    Resp: 16 18  Temp:    SpO2: 93% 94%    Last Pain:  Vitals:   01/07/24 1945  PainSc: 0-No pain                 Camellia Merilee Louder

## 2024-01-07 NOTE — Anesthesia Preprocedure Evaluation (Addendum)
 Anesthesia Evaluation  Patient identified by MRN, date of birth, ID band Patient awake    Reviewed: Allergy & Precautions, H&P , NPO status , Patient's Chart, lab work & pertinent test results  Airway Mallampati: III  TM Distance: >3 FB Neck ROM: full    Dental  (+) Edentulous Upper, Edentulous Lower   Pulmonary COPD, former smoker   Pulmonary exam normal        Cardiovascular hypertension, Pt. on home beta blockers and Pt. on medications + CAD, + Past MI, + Cardiac Stents (2010), + Peripheral Vascular Disease (PAD with remote history of aortofemoral bypass grafting) and +CHF  Normal cardiovascular exam+ Cardiac Defibrillator [employer]: [phone], interrogation 01/01/24, non-dependent)   Per admission 12/2023: Atypical chest pain/coronary artery disease: Has a poor functional status with less than 4 METS movements. He denies any exertional component to his chest pain. Cardiology was consulted perform a 2D echo that showed no regional wall motion abnormalities. Cardiology recommended long-acting nitroglycerin .  As he does not have an unstable cardiac condition. He will need to go proceed with Koska P on Tuesday as an outpatient.   Chronic atrial fibrillation: Resume Coreg  and Eliquis  no changes made.   -s/p NSTEMI in 2010 with DES to proximal LAD with D1               jailed and angioplasty alone to ostium -history of V-fib in 2021 with a left heart cath [facility] results: 1.Significant CAD with left dominant system 2. Separate ostia for LAD & LCx 3. LAD with widely patent proximal sten. Mild non-obstructive CAD 4. LCX large dominant vessel with moderate diffuse disease. Culprit lesion appears to be dissected OM-2 branch with 99% lesion 5. RCA small non-dominant that is totally occluded 6. LVEF recovered at 55% 7. Well-compensated hemodynamics.   ECHO 12/2023:  1. Left ventricular ejection fraction, by estimation,  is 60 to 65%. The  left ventricle has normal function. Left ventricular endocardial border  not optimally defined to evaluate regional wall motion. Left ventricular  diastolic parameters are  indeterminate.   2. Right ventricular systolic function is normal. The right ventricular  size is normal.   3. The mitral valve is abnormal. Mild mitral valve regurgitation.   4. The aortic valve was not well visualized. Aortic valve regurgitation  is not visualized.   5. The inferior vena cava is dilated in size with <50% respiratory  variability, suggesting right atrial pressure of 15 mmHg.     Neuro/Psych negative neurological ROS  negative psych ROS   GI/Hepatic negative GI ROS, Neg liver ROS,,,  Endo/Other  diabetes, Type 2    Renal/GU Renal InsufficiencyRenal disease     Musculoskeletal Osseous lesion spine and pelvis   Abdominal   Peds  Hematology  (+) Blood dyscrasia, anemia   Anesthesia Other Findings Lung mass with mets. Cardiology clearance 11/7. There was not mention of patient on prn oxygen , but family states he uses it as needed mainly at night starting around 2 weeks ago. They state his saturation was low at a doctors office visit for which oxygen  was prescribed. He is in no acute distress today. His vitals are normal. I do not think there is acute cardiac dysfunction to address.   Past Medical History: No date: Acute upper GI bleed No date: Anemia No date: Anticoagulated on apixaban  2021: Cardiac arrest with ventricular fibrillation (HCC) No date: CHF (congestive heart failure) (HCC) No date: Chronic a-fib (HCC) No date: Coronary artery disease  Comment:  a. s/p NSTEMI in 2010 with DES to proximal LAD with D1               jailed and angioplasty alone to ostium No date: Dyspnea No date: GERD (gastroesophageal reflux disease) No date: Hyperlipidemia No date: Hypertension No date: Mediastinal lymphadenopathy 2010: NSTEMI (non-ST elevated myocardial infarction)  (HCC) No date: Peripheral arterial disease No date: S/P coronary artery stent placement No date: S/P ICD (internal cardiac defibrillator) procedure No date: Tobacco abuse No date: Type 2 diabetes mellitus (HCC)  Past Surgical History: No date: CARDIAC SURGERY 01/08/2020: COLONOSCOPY WITH PROPOFOL ; N/A     Comment:  Procedure: COLONOSCOPY WITH PROPOFOL ;  Surgeon: Cindie Carlin POUR, DO;  Location: [facility];  Service:               Endoscopy;  Laterality: N/A; 05/03/2020: COLONOSCOPY WITH PROPOFOL ; N/A     Comment:  Hung:entire examined colon normal 09/03/2019: CORONARY STENT INTERVENTION; N/A     Comment:  Procedure: CORONARY STENT INTERVENTION;  Surgeon:               Verlin Lonni BIRCH, MD;  Location: [facility] INVASIVE CV               LAB;  Service: Cardiovascular;  Laterality: N/A; No date: CORONARY STENT PLACEMENT 05/03/2020: ENTEROSCOPY; N/A     Comment:  hung:normal esophagus, normal stomach. Three               non-bleeding angiodysplastic lesions in the duodenum.               Treated with a monopolar probe. A few non-bleeding               angiodysplastic lesions in the jejunum. Treated with a               monopolar probe. No specimens collected. 01/06/2020: ESOPHAGOGASTRODUODENOSCOPY (EGD) WITH PROPOFOL ; N/A     Comment:  Procedure: ESOPHAGOGASTRODUODENOSCOPY (EGD) WITH               PROPOFOL ;  Surgeon: Golda Claudis PENNER, MD;  Location: [facility]               ENDO SUITE;  Service: Endoscopy;  Laterality: N/A; 05/03/2020: HOT HEMOSTASIS; N/A     Comment:  Procedure: HOT HEMOSTASIS (ARGON PLASMA               COAGULATION/BICAP);  Surgeon: Rollin Dover, MD;                Location: [facility] ENDOSCOPY;  Service: Gastroenterology;                Laterality: N/A; 09/04/2019: ICD IMPLANT; N/A     Comment:  Procedure: ICD IMPLANT;  Surgeon: Waddell Danelle ORN, MD;                Location: [facility] INVASIVE CV LAB;  Service: Cardiovascular;                Laterality:  N/A; 09/03/2019: RIGHT/LEFT HEART CATH AND CORONARY ANGIOGRAPHY; N/A     Comment:  Procedure: RIGHT/LEFT HEART CATH AND CORONARY               ANGIOGRAPHY;  Surgeon: Cherrie Toribio SAUNDERS, MD;                Location: [facility] INVASIVE CV LAB;  Service: Cardiovascular;  Laterality: N/A;     Reproductive/Obstetrics negative OB ROS                              Anesthesia Physical Anesthesia Plan  ASA: 3  Anesthesia Plan: General ETT and General   Post-op Pain Management: Minimal or no pain anticipated   Induction: Intravenous  PONV Risk Score and Plan: 2 and Ondansetron  and Dexamethasone   Airway Management Planned: Oral ETT  Additional Equipment:   Intra-op Plan:   Post-operative Plan: Extubation in OR  Informed Consent: I have reviewed the patients History and Physical, chart, labs and discussed the procedure including the risks, benefits and alternatives for the proposed anesthesia with the patient or authorized representative who has indicated his/her understanding and acceptance.     Dental Advisory Given  Plan Discussed with: CRNA and Surgeon  Anesthesia Plan Comments: (Bed bugs. Family notified. Pt will be the last procedure today)         Anesthesia Quick Evaluation

## 2024-01-07 NOTE — Progress Notes (Signed)
 No chest xray needed post procedure per Dr. Malka

## 2024-01-07 NOTE — Anesthesia Procedure Notes (Signed)
 Procedure Name: Intubation Date/Time: 01/07/2024 5:27 PM  Performed by: Heyli Min, CRNAPre-anesthesia Checklist: Patient identified, Emergency Drugs available, Suction available and Patient being monitored Patient Re-evaluated:Patient Re-evaluated prior to induction Oxygen  Delivery Method: Circle System Utilized Preoxygenation: Pre-oxygenation with 100% oxygen  Induction Type: IV induction Ventilation: Mask ventilation without difficulty Laryngoscope Size: Glidescope and 4 Grade View: Grade I Tube type: Oral Tube size: 9.0 mm Number of attempts: 1 Airway Equipment and Method: Stylet and Oral airway Placement Confirmation: ETT inserted through vocal cords under direct vision, positive ETCO2 and breath sounds checked- equal and bilateral Secured at: 22 cm Tube secured with: Tape Dental Injury: Teeth and Oropharynx as per pre-operative assessment  Comments: Lips and tongue unchanged. Head and neck midline.

## 2024-01-07 NOTE — Progress Notes (Signed)
 Patient awake/alert x4 upon discharge. Oxygen  sats 91-92% on RA. Patient uses oxygen  at night. Instructed patient and patient's sister to wear his oxygen  when he gets home, verbalizes understanding.

## 2024-01-07 NOTE — Op Note (Signed)
 Flexible and EBUS Bronchoscopy Procedure Note  Peter Lambert  [id number]  [birth year]  Date:01/07/24  Time:6:14 PM   Provider Performing:Jean-Pierre Cassondra Stachowski   Procedure: Flexible bronchoscopy and EBUS Bronchoscopy  Indication(s) RUL Mass Mediastinal Lymphadenopathy  Consent Risks of the procedure as well as the alternatives and risks of each were explained to the patient and/or caregiver.  Consent for the procedure was obtained.  Anesthesia General Anesthesia   Time Out Verified patient identification, verified procedure, site/side was marked, verified correct patient position, special equipment/implants available, medications/allergies/relevant history reviewed, required imaging and test results available.   Sterile Technique Usual hand hygiene, masks, gowns, and gloves were used   Procedure Description Diagnostic bronchoscope advanced through endotracheal tube and into airway.  Airways were examined down to subsegmental level with findings noted below.  Following diagnostic evaluation, The diagnostic bronchoscope was then removed and the EBUS bronchoscope was advanced into airway with stations 4R, 7, 11RS biopsied and sent for slide, cell block, and/or culture.  The EBUS bronchoscope was removed after assuring no active bleeding from biopsy site.  Findings:  Carina: Sharp, no lesions RM: Normal BI: Narrowed by external compression RUL: Bronchus hypervascularized, narrowed anterior segment, no clear endobronchial lesions.  RML: Normal  RLL: Normal LM, LUL and LLL: Normal   EBUS-TBNA 4R, 7 and 11RS: x5 TBNA each node.   Complications/Tolerance None; patient tolerated the procedure well. Chest X-ray is not needed post procedure.  EBL Minimal  Specimen(s) -- EBUS-TBNA 4R, 7 and 11RS: x5 TBNA each node.  -- RUL BAL: Cytology  Darrin Barn, MD [facility] 01/07/2024 6:19 PM

## 2024-01-07 NOTE — Transfer of Care (Signed)
 Immediate Anesthesia Transfer of Care Note  Patient: Peter Lambert  Procedure(s) Performed: ENDOBRONCHIAL ULTRASOUND (EBUS) (Bilateral) DG C-ARM 1-60 MIN-NO REPORT  Patient Location: PACU  Anesthesia Type:General  Level of Consciousness: drowsy  Airway & Oxygen  Therapy: Patient Spontanous Breathing and Patient connected to face mask oxygen   Post-op Assessment: Report given to RN and Post -op Vital signs reviewed and stable  Post vital signs: Reviewed and stable  Last Vitals:  Vitals Value Taken Time  BP 125/61 01/07/24 18:30  Temp 36.2 C 01/07/24 18:30  Pulse    Resp 14 01/07/24 18:30  SpO2 98 % 01/07/24 18:30    Last Pain:  Vitals:   01/07/24 1830  PainSc: 0-No pain         Complications: No notable events documented.

## 2024-01-08 ENCOUNTER — Encounter: Payer: Self-pay | Admitting: Pulmonary Disease

## 2024-01-08 ENCOUNTER — Other Ambulatory Visit: Payer: Self-pay

## 2024-01-08 ENCOUNTER — Other Ambulatory Visit: Payer: Self-pay | Admitting: *Deleted

## 2024-01-08 ENCOUNTER — Telehealth: Payer: Self-pay | Admitting: *Deleted

## 2024-01-08 LAB — CYTOLOGY - NON PAP

## 2024-01-08 MED ORDER — TRAMADOL HCL 50 MG PO TABS: ORAL_TABLET | ORAL | 0 refills | Status: DC

## 2024-01-08 NOTE — Telephone Encounter (Signed)
 Received call from Faith Little stating that Fentanyl  patch 25 mcg is not effective enough to control pain.  Per Delon Hope, NP will send in Tramadol  50 mg to take Q 6-8 hours.  Patient aware and will let us  know if this is not sufficient.

## 2024-01-09 ENCOUNTER — Encounter (HOSPITAL_COMMUNITY)
Admission: RE | Admit: 2024-01-09 | Discharge: 2024-01-09 | Disposition: A | Discharge status: Home / Self Care | Source: Ambulatory Visit | Attending: Oncology | Admitting: Oncology

## 2024-01-09 ENCOUNTER — Telehealth: Payer: Self-pay | Admitting: Pulmonary Disease

## 2024-01-09 DIAGNOSIS — R918 Other nonspecific abnormal finding of lung field: Secondary | ICD-10-CM

## 2024-01-09 DIAGNOSIS — C7951 Secondary malignant neoplasm of bone: Secondary | ICD-10-CM | POA: Diagnosis not present

## 2024-01-09 MED ORDER — FLUDEOXYGLUCOSE F - 18 (FDG) INJECTION
7.3200 | Freq: Once | INTRAVENOUS | Status: AC | PRN
  Administered 2024-01-09: 7.32 via INTRAVENOUS

## 2024-01-09 NOTE — Telephone Encounter (Signed)
 Called Mr. Sharvil and discussed the results of his recent lung biopsy. He has a PET scan scheduled today and an appointment with Dr. Davonna on 11/25.   Darrin Barn, MD [facility] 01/09/2024 10:30 AM

## 2024-01-09 NOTE — Interval H&P Note (Signed)
 History and Physical Interval Note:   01/07/2024  Peter Lambert  has presented today for surgery, with the diagnosis of lung mass.  The various methods of treatment have been discussed with the patient and family. After consideration of risks, benefits and other options for treatment, the patient has consented to  Procedure(s): ENDOBRONCHIAL ULTRASOUND (EBUS) (Bilateral) as a surgical intervention.  [name] history has been reviewed, patient examined, no change in status, stable for surgery.  I have reviewed the patient's chart and labs.  Questions were answered to the patient's satisfaction.     [name]

## 2024-01-10 ENCOUNTER — Other Ambulatory Visit: Payer: Self-pay

## 2024-01-10 DIAGNOSIS — C3490 Malignant neoplasm of unspecified part of unspecified bronchus or lung: Secondary | ICD-10-CM

## 2024-01-13 ENCOUNTER — Encounter (HOSPITAL_COMMUNITY): Payer: Self-pay | Admitting: Emergency Medicine

## 2024-01-13 ENCOUNTER — Telehealth: Payer: Self-pay | Admitting: *Deleted

## 2024-01-13 ENCOUNTER — Other Ambulatory Visit: Payer: Self-pay

## 2024-01-13 ENCOUNTER — Emergency Department (HOSPITAL_COMMUNITY)

## 2024-01-13 ENCOUNTER — Inpatient Hospital Stay (HOSPITAL_COMMUNITY)
Admission: EM | Admit: 2024-01-13 | Discharge: 2024-01-19 | DRG: 516 | Disposition: A | Discharge status: Home Health | Attending: Internal Medicine | Admitting: Internal Medicine

## 2024-01-13 DIAGNOSIS — Z79899 Other long term (current) drug therapy: Secondary | ICD-10-CM

## 2024-01-13 DIAGNOSIS — M4804 Spinal stenosis, thoracic region: Secondary | ICD-10-CM | POA: Diagnosis present

## 2024-01-13 DIAGNOSIS — E1151 Type 2 diabetes mellitus with diabetic peripheral angiopathy without gangrene: Secondary | ICD-10-CM | POA: Diagnosis present

## 2024-01-13 DIAGNOSIS — K219 Gastro-esophageal reflux disease without esophagitis: Secondary | ICD-10-CM | POA: Diagnosis present

## 2024-01-13 DIAGNOSIS — M47816 Spondylosis without myelopathy or radiculopathy, lumbar region: Secondary | ICD-10-CM | POA: Diagnosis not present

## 2024-01-13 DIAGNOSIS — I252 Old myocardial infarction: Secondary | ICD-10-CM | POA: Diagnosis not present

## 2024-01-13 DIAGNOSIS — Z8674 Personal history of sudden cardiac arrest: Secondary | ICD-10-CM | POA: Diagnosis not present

## 2024-01-13 DIAGNOSIS — R5381 Other malaise: Secondary | ICD-10-CM

## 2024-01-13 DIAGNOSIS — I2510 Atherosclerotic heart disease of native coronary artery without angina pectoris: Secondary | ICD-10-CM | POA: Diagnosis present

## 2024-01-13 DIAGNOSIS — G893 Neoplasm related pain (acute) (chronic): Secondary | ICD-10-CM | POA: Diagnosis present

## 2024-01-13 DIAGNOSIS — R627 Adult failure to thrive: Secondary | ICD-10-CM | POA: Diagnosis present

## 2024-01-13 DIAGNOSIS — M8448XA Pathological fracture, other site, initial encounter for fracture: Secondary | ICD-10-CM | POA: Diagnosis present

## 2024-01-13 DIAGNOSIS — L8990 Pressure ulcer of unspecified site, unspecified stage: Secondary | ICD-10-CM | POA: Insufficient documentation

## 2024-01-13 DIAGNOSIS — R29898 Other symptoms and signs involving the musculoskeletal system: Principal | ICD-10-CM | POA: Diagnosis present

## 2024-01-13 DIAGNOSIS — D630 Anemia in neoplastic disease: Secondary | ICD-10-CM | POA: Diagnosis present

## 2024-01-13 DIAGNOSIS — Z87891 Personal history of nicotine dependence: Secondary | ICD-10-CM | POA: Diagnosis not present

## 2024-01-13 DIAGNOSIS — R64 Cachexia: Secondary | ICD-10-CM | POA: Diagnosis present

## 2024-01-13 DIAGNOSIS — Z95810 Presence of automatic (implantable) cardiac defibrillator: Secondary | ICD-10-CM

## 2024-01-13 DIAGNOSIS — Z720 Tobacco use: Secondary | ICD-10-CM | POA: Diagnosis not present

## 2024-01-13 DIAGNOSIS — I4819 Other persistent atrial fibrillation: Secondary | ICD-10-CM | POA: Diagnosis present

## 2024-01-13 DIAGNOSIS — I130 Hypertensive heart and chronic kidney disease with heart failure and stage 1 through stage 4 chronic kidney disease, or unspecified chronic kidney disease: Secondary | ICD-10-CM | POA: Diagnosis present

## 2024-01-13 DIAGNOSIS — C7951 Secondary malignant neoplasm of bone: Principal | ICD-10-CM | POA: Diagnosis present

## 2024-01-13 DIAGNOSIS — Z6821 Body mass index (BMI) 21.0-21.9, adult: Secondary | ICD-10-CM

## 2024-01-13 DIAGNOSIS — R54 Age-related physical debility: Secondary | ICD-10-CM | POA: Diagnosis present

## 2024-01-13 DIAGNOSIS — I482 Chronic atrial fibrillation, unspecified: Secondary | ICD-10-CM | POA: Diagnosis present

## 2024-01-13 DIAGNOSIS — J449 Chronic obstructive pulmonary disease, unspecified: Secondary | ICD-10-CM | POA: Diagnosis present

## 2024-01-13 DIAGNOSIS — E1122 Type 2 diabetes mellitus with diabetic chronic kidney disease: Secondary | ICD-10-CM | POA: Diagnosis present

## 2024-01-13 DIAGNOSIS — I10 Essential (primary) hypertension: Secondary | ICD-10-CM

## 2024-01-13 DIAGNOSIS — L89152 Pressure ulcer of sacral region, stage 2: Secondary | ICD-10-CM | POA: Diagnosis present

## 2024-01-13 DIAGNOSIS — M545 Low back pain, unspecified: Secondary | ICD-10-CM | POA: Diagnosis present

## 2024-01-13 DIAGNOSIS — M79676 Pain in unspecified toe(s): Secondary | ICD-10-CM | POA: Diagnosis not present

## 2024-01-13 DIAGNOSIS — T380X5A Adverse effect of glucocorticoids and synthetic analogues, initial encounter: Secondary | ICD-10-CM | POA: Diagnosis present

## 2024-01-13 DIAGNOSIS — C3411 Malignant neoplasm of upper lobe, right bronchus or lung: Secondary | ICD-10-CM | POA: Diagnosis present

## 2024-01-13 DIAGNOSIS — Z510 Encounter for antineoplastic radiation therapy: Secondary | ICD-10-CM | POA: Diagnosis not present

## 2024-01-13 DIAGNOSIS — Z7901 Long term (current) use of anticoagulants: Secondary | ICD-10-CM | POA: Diagnosis not present

## 2024-01-13 DIAGNOSIS — Z7982 Long term (current) use of aspirin: Secondary | ICD-10-CM

## 2024-01-13 DIAGNOSIS — F17210 Nicotine dependence, cigarettes, uncomplicated: Secondary | ICD-10-CM | POA: Diagnosis present

## 2024-01-13 DIAGNOSIS — M48061 Spinal stenosis, lumbar region without neurogenic claudication: Secondary | ICD-10-CM | POA: Diagnosis not present

## 2024-01-13 DIAGNOSIS — E782 Mixed hyperlipidemia: Secondary | ICD-10-CM | POA: Diagnosis present

## 2024-01-13 DIAGNOSIS — Z8042 Family history of malignant neoplasm of prostate: Secondary | ICD-10-CM

## 2024-01-13 DIAGNOSIS — N1831 Chronic kidney disease, stage 3a: Secondary | ICD-10-CM | POA: Diagnosis present

## 2024-01-13 DIAGNOSIS — I5032 Chronic diastolic (congestive) heart failure: Secondary | ICD-10-CM | POA: Diagnosis present

## 2024-01-13 DIAGNOSIS — R531 Weakness: Secondary | ICD-10-CM | POA: Diagnosis not present

## 2024-01-13 DIAGNOSIS — E039 Hypothyroidism, unspecified: Secondary | ICD-10-CM | POA: Diagnosis present

## 2024-01-13 DIAGNOSIS — D72829 Elevated white blood cell count, unspecified: Secondary | ICD-10-CM | POA: Diagnosis present

## 2024-01-13 DIAGNOSIS — D497 Neoplasm of unspecified behavior of endocrine glands and other parts of nervous system: Secondary | ICD-10-CM | POA: Diagnosis present

## 2024-01-13 DIAGNOSIS — G9520 Unspecified cord compression: Secondary | ICD-10-CM | POA: Diagnosis not present

## 2024-01-13 DIAGNOSIS — C3490 Malignant neoplasm of unspecified part of unspecified bronchus or lung: Secondary | ICD-10-CM | POA: Diagnosis not present

## 2024-01-13 DIAGNOSIS — R918 Other nonspecific abnormal finding of lung field: Secondary | ICD-10-CM | POA: Diagnosis not present

## 2024-01-13 DIAGNOSIS — M4854XA Collapsed vertebra, not elsewhere classified, thoracic region, initial encounter for fracture: Secondary | ICD-10-CM | POA: Diagnosis not present

## 2024-01-13 DIAGNOSIS — W19XXXA Unspecified fall, initial encounter: Secondary | ICD-10-CM | POA: Diagnosis present

## 2024-01-13 DIAGNOSIS — Z955 Presence of coronary angioplasty implant and graft: Secondary | ICD-10-CM

## 2024-01-13 DIAGNOSIS — C801 Malignant (primary) neoplasm, unspecified: Secondary | ICD-10-CM | POA: Diagnosis not present

## 2024-01-13 DIAGNOSIS — Z800 Family history of malignant neoplasm of digestive organs: Secondary | ICD-10-CM

## 2024-01-13 DIAGNOSIS — R590 Localized enlarged lymph nodes: Secondary | ICD-10-CM | POA: Diagnosis present

## 2024-01-13 DIAGNOSIS — Z79890 Hormone replacement therapy (postmenopausal): Secondary | ICD-10-CM

## 2024-01-13 LAB — CBC
HCT: 33.7 % — ABNORMAL LOW (ref 39.0–52.0)
Hemoglobin: 10.6 g/dL — ABNORMAL LOW (ref 13.0–17.0)
MCH: 26.4 pg (ref 26.0–34.0)
MCHC: 31.5 g/dL (ref 30.0–36.0)
MCV: 84 fL (ref 80.0–100.0)
Platelets: 365 K/uL (ref 150–400)
RBC: 4.01 MIL/uL — ABNORMAL LOW (ref 4.22–5.81)
RDW: 15.1 % (ref 11.5–15.5)
WBC: 14.6 K/uL — ABNORMAL HIGH (ref 4.0–10.5)
nRBC: 0 % (ref 0.0–0.2)

## 2024-01-13 LAB — COMPREHENSIVE METABOLIC PANEL WITH GFR
ALT: 23 U/L (ref 0–44)
AST: 26 U/L (ref 15–41)
Albumin: 3.5 g/dL (ref 3.5–5.0)
Alkaline Phosphatase: 111 U/L (ref 38–126)
Anion gap: 11 (ref 5–15)
BUN: 20 mg/dL (ref 8–23)
CO2: 30 mmol/L (ref 22–32)
Calcium: 11.9 mg/dL — ABNORMAL HIGH (ref 8.9–10.3)
Chloride: 98 mmol/L (ref 98–111)
Creatinine, Ser: 1.15 mg/dL (ref 0.61–1.24)
GFR, Estimated: >60 mL/min (ref >60)
Glucose, Bld: 133 mg/dL — ABNORMAL HIGH (ref 70–99)
Potassium: 4 mmol/L (ref 3.5–5.1)
Sodium: 139 mmol/L (ref 135–145)
Total Bilirubin: 0.5 mg/dL (ref 0.0–1.2)
Total Protein: 7.2 g/dL (ref 6.5–8.1)

## 2024-01-13 LAB — CBG MONITORING, ED: Glucose-Capillary: 74 mg/dL (ref 70–99)

## 2024-01-13 MED ORDER — MORPHINE SULFATE (PF) 4 MG/ML IV SOLN
4.0000 mg | Freq: Once | INTRAVENOUS | Status: AC
  Administered 2024-01-13: 4 mg via INTRAVENOUS
  Filled 2024-01-13: qty 1

## 2024-01-13 MED ORDER — GADOBUTROL 1 MMOL/ML IV SOLN
6.0000 mL | Freq: Once | INTRAVENOUS | Status: AC | PRN
  Administered 2024-01-13: 6 mL via INTRAVENOUS

## 2024-01-13 MED ORDER — DEXAMETHASONE SOD PHOSPHATE PF 10 MG/ML IJ SOLN
10.0000 mg | Freq: Once | INTRAMUSCULAR | Status: AC
  Administered 2024-01-13: 10 mg via INTRAVENOUS

## 2024-01-13 MED ORDER — SODIUM CHLORIDE 0.9 % IV BOLUS
500.0000 mL | Freq: Once | INTRAVENOUS | Status: AC
  Administered 2024-01-13: 500 mL via INTRAVENOUS

## 2024-01-13 NOTE — ED Notes (Signed)
 Patient urinated it urinal but family member dumped it out before it could be collected per patient.  Patient educated that next time he needed to urinate, that we needed sample.  Patient verbalized understanding.

## 2024-01-13 NOTE — Progress Notes (Signed)
  Device system confirmed to be MRI conditional, with implant date > 6 weeks ago, and no evidence of abandoned or epicardial leads in review of most recent CXR  Device last cleared by EP Provider Daphne Arthur  Clearance is good through for 1 year as long as parameters remain stable at time of check. If pt undergoes a cardiac device procedure during that time, they should be re-cleared.   Tachy-therapies to be programmed off if applicable with device back to pre-MRI settings after completion of exam.  [employer] was available remotely to assist in programming recommendations.   Peter Lambert  01/13/2024 5:38 PM

## 2024-01-13 NOTE — ED Provider Notes (Signed)
 [facility] EMERGENCY DEPARTMENT AT [facility] Provider Note   CSN: [id number] Arrival date & time: 01/13/24  1412     Patient presents with: Weakness   Peter Lambert is a 78 y.o. male.  Brought in by family member for recent decline over the last few weeks.  He had been walking with a cane, has been needing to use walker and for last few days has not been able to ambulate.  Complaining of pain in both of his feet mostly at the great toes along with his hips and back.  No appetite.  He uses oxygen  as needed.  Has had a fall.  He is on blood thinners.  Was seen in late October for hip pain and found to have lytic lesions and possible lung cancer.  Is undergoing workup with Dr. Davonna.  Just had a bronchoscopy.  {Add pertinent medical, surgical, social history, OB history to YEP:67052} The history is provided by the patient.  Weakness Severity:  Severe Onset quality:  Gradual Duration:  2 weeks Timing:  Constant Progression:  Worsening Chronicity:  New Relieved by:  Nothing Worsened by:  Activity Ineffective treatments:  Rest Associated symptoms: difficulty walking and falls   Associated symptoms: no abdominal pain, no chest pain, no cough, no diarrhea, no dysuria, no fever, no nausea, no shortness of breath and no vomiting        Prior to Admission medications   Medication Sig Start Date End Date Taking? Authorizing Provider  apixaban  (ELIQUIS ) 5 MG TABS tablet Take 1 tablet (5 mg total) by mouth 2 (two) times daily. Hold Eliquis  on 01/05/2024 for bronchoscopy 01/03/24   Odell Celinda Balo, MD  aspirin  EC 81 MG tablet Take 81 mg by mouth daily. Swallow whole.    [provider]  atorvastatin  (LIPITOR ) 80 MG tablet Take 80 mg by mouth daily.    [provider]  carvedilol  (COREG ) 3.125 MG tablet Take 1 tablet (3.125 mg total) by mouth daily with breakfast. 05/04/20 01/01/25  Shona Terry SAILOR, DO  [employer] G[employer]LE LAXATIVE 5 MG EC tablet TAKE 1 TABLET BY MOUTH  DAILY AS NEEDED FOR MODERATE CONSTIPATION. TAKE TWO DAYS BEFORE PROCEDURE Patient taking differently: Take 5 mg by mouth daily as needed for mild constipation or moderate constipation. 05/30/20   Castaneda Mayorga, Daniel, MD  ferrous sulfate  (FERROUSUL) 325 (65 FE) MG tablet Take 1 tablet (325 mg total) by mouth daily with breakfast. 01/19/20   Rehman, Claudis PENNER, MD  furosemide  (LASIX ) 20 MG tablet Take 20 mg by mouth daily.    [provider]  losartan  (COZAAR ) 25 MG tablet Take 25 mg by mouth daily. 01/14/21   [provider]  nitroGLYCERIN  (NITROSTAT ) 0.4 MG SL tablet Place 1 tablet (0.4 mg total) under the tongue every 5 (five) minutes as needed for chest pain. Patient must keep upcoming appointment for further refills 07/20/21   Court Dorn PARAS, MD  omeprazole  (PRILOSEC) 20 MG capsule Take 20 mg by mouth daily after lunch.    [provider]  potassium chloride  SA (KLOR-CON ) 20 MEQ tablet Take 20 mEq by mouth daily after lunch.    [provider]  spironolactone  (ALDACTONE ) 50 MG tablet Take 50 mg by mouth daily. 02/11/21   [provider]  traMADol  (ULTRAM ) 50 MG tablet Take 1 tablet every 6-8 hours as needed for pain 01/08/24   Burns, Jennifer E, NP  potassium chloride  (KLOR-CON ) 20 MEQ packet TAKE 1 TABLET BY MOUTH EVERY DAY 12/24/12  07/14/13  Court Dorn PARAS, MD    Allergies: Patient has no known allergies.    Review of Systems  Constitutional:  Negative for fever.  Respiratory:  Negative for cough and shortness of breath.   Cardiovascular:  Negative for chest pain.  Gastrointestinal:  Negative for abdominal pain, diarrhea, nausea and vomiting.  Genitourinary:  Negative for dysuria.  Musculoskeletal:  Positive for falls.  Neurological:  Positive for weakness.    Updated Vital Signs BP (!) 108/59 (BP Location: Left Arm)   Pulse (!) 124   Temp 97.9 F (36.6 C) (Oral)   Resp 17   Ht 5' 8 (1.727 m)   Wt 64.4 kg   SpO2 100%   BMI  21.59 kg/m   Physical Exam Vitals and nursing note reviewed.  Constitutional:      Appearance: Normal appearance. He is well-developed.  HENT:     Head: Normocephalic and atraumatic.  Eyes:     Conjunctiva/sclera: Conjunctivae normal.  Cardiovascular:     Rate and Rhythm: Normal rate and regular rhythm.     Heart sounds: No murmur heard. Pulmonary:     Effort: Pulmonary effort is normal. No respiratory distress.     Breath sounds: Normal breath sounds.  Abdominal:     Palpations: Abdomen is soft.     Tenderness: There is no abdominal tenderness. There is no guarding or rebound.  Musculoskeletal:        General: Tenderness present.     Cervical back: Neck supple.     Right lower leg: No edema.     Left lower leg: No edema.     Comments: He has some vague tenderness about his feet and his hips.  There is no gross deformities and no open wounds or significant edema.  He has good distal pulses.  Skin:    General: Skin is warm and dry.  Neurological:     General: No focal deficit present.     Mental Status: He is alert.     GCS: GCS eye subscore is 4. GCS verbal subscore is 5. GCS motor subscore is 6.     Sensory: No sensory deficit.     Motor: No weakness.     (all labs ordered are listed, but only abnormal results are displayed) Labs Reviewed  COMPREHENSIVE METABOLIC PANEL WITH GFR  CBC  URINALYSIS, ROUTINE W REFLEX MICROSCOPIC  CBG MONITORING, ED    EKG: None  Radiology: No results found.  {Document cardiac monitor, telemetry assessment procedure when appropriate:32947} Procedures   Medications Ordered in the ED - No data to display  Clinical Course as of 01/13/24 2341  Mon Jan 13, 2024  1522 Reached out to oncology Dr. Jillian.  She is concerned he might have a spinal lesion as he had a mass at T11 on his PET scan.  I discussed with MRI here and they will look into whether his device is MRI compatible. [MB]  1523 Chest x-ray interpreted by me as possible  lesion right upper lobe.  Also has AICD.  Awaiting radiology reading. [MB]  1524 EKG not crossing into epic.  Atrial fibrillation with rapid ventricular response, right bundle branch block.  Similar to prior. [MB]  1551 Patient had a PET scan yesterday - IMPRESSION: 1. The dominant right upper lobe lung mass is hypermetabolic, consistent with primary bronchogenic carcinoma. 2. Hypermetabolic mediastinal adenopathy consistent with metastatic disease. 3. Widespread osseous metastatic disease with multiple lesions as described. The T11 lesion is associated with anterior epidural  tumor which may place the patient at risk for cord compression. 4. Hypermetabolic peritoneal nodularity in the left upper quadrant consistent with metastatic disease.  [MB]  2037 Reviewed MRI with Dr. Colon and neurosurgery.  He said the patient should go on some steroids and be admitted to [facility] campus where he can consult on him.  He is not sure if surgery is going to be indicated with his widespread metastatic disease but would least like to evaluate him and discuss it with him. [MB]  2037 Patient agreeable to plan for admission to [facility] [MB]  2119 Discussed with Dr. Manfred [facility] hospitalist who will evaluate patient for admission. [MB]    Clinical Course User Index [MB] Towana Ozell BROCKS, MD   {Click here for ABCD2, HEART and other calculators REFRESH Note before signing:1}                              Medical Decision Making Amount and/or Complexity of Data Reviewed Labs: ordered. Radiology: ordered.  Risk Prescription drug management. Decision regarding hospitalization.   This patient complains of ***; this involves an extensive number of treatment Options and is a complaint that carries with it a high risk of complications and morbidity. The differential includes ***  I ordered, reviewed and interpreted labs, which included *** I ordered medication *** and reviewed PMP when indicated. I  ordered imaging studies which included *** and I independently    visualized and interpreted imaging which showed *** Additional history obtained from *** Previous records obtained and reviewed *** I consulted *** and discussed lab and imaging findings and discussed disposition.  Cardiac monitoring reviewed, *** Social determinants considered, *** Critical Interventions: ***  After the interventions stated above, I reevaluated the patient and found *** Admission and further testing considered, ***   {Document critical care time when appropriate  Document review of labs and clinical decision tools ie CHADS2VASC2, etc  Document your independent review of radiology images and any outside records  Document your discussion with family members, caretakers and with consultants  Document social determinants of health affecting pt's care  Document your decision making why or why not admission, treatments were needed:32947:::1}   Final diagnoses:  None    ED Discharge Orders     None

## 2024-01-13 NOTE — H&P (Signed)
 History and Physical    Patient: Peter Lambert FMW:[id number] DOB: February 09, 1946 DOA: 01/13/2024 DOS: the patient was seen and examined on 01/13/2024 PCP: Marvine Rush, MD  Patient coming from: {Point_of_Origin:[address]}  Chief Complaint:  Chief Complaint  Patient presents with   Weakness   HPI: Peter Lambert is a 78 y.o. male with medical history significant of ***  Review of Systems: {ROS_Text:26778} Past Medical History:  Diagnosis Date   Acute upper GI bleed    Anemia    Anticoagulated on apixaban     Cardiac arrest with ventricular fibrillation (HCC) 2021   CHF (congestive heart failure) (HCC)    Chronic a-fib (HCC)    Coronary artery disease    a. s/p NSTEMI in 2010 with DES to proximal LAD with D1 jailed and angioplasty alone to ostium   Dyspnea    GERD (gastroesophageal reflux disease)    Hyperlipidemia    Hypertension    Mediastinal lymphadenopathy    NSTEMI (non-ST elevated myocardial infarction) (HCC) 2010   Peripheral arterial disease    S/P coronary artery stent placement    S/P ICD (internal cardiac defibrillator) procedure    Tobacco abuse    Type 2 diabetes mellitus (HCC)    Past Surgical History:  Procedure Laterality Date   CARDIAC SURGERY     COLONOSCOPY WITH PROPOFOL  N/A 01/08/2020   Procedure: COLONOSCOPY WITH PROPOFOL ;  Surgeon: Cindie Carlin POUR, DO;  [facility];  Service: Endoscopy;  Laterality: N/A;   COLONOSCOPY WITH PROPOFOL  N/A 05/03/2020   Hung:entire examined colon normal   CORONARY STENT INTERVENTION N/A 09/03/2019   Procedure: CORONARY STENT INTERVENTION;  Surgeon: Verlin Lonni BIRCH, MD;  [facility];  Service: Cardiovascular;  Laterality: N/A;   CORONARY STENT PLACEMENT     ENDOBRONCHIAL ULTRASOUND Bilateral 01/07/2024   Procedure: ENDOBRONCHIAL ULTRASOUND (EBUS);  Surgeon: Malka Domino, MD;  Location: [facility];  Service: Pulmonary;  Laterality: Bilateral;   ENTEROSCOPY N/A 05/03/2020    hung:normal esophagus, normal stomach. Three non-bleeding angiodysplastic lesions in the duodenum. Treated with a monopolar probe. A few non-bleeding angiodysplastic lesions in the jejunum. Treated with a monopolar probe. No specimens collected.   ESOPHAGOGASTRODUODENOSCOPY (EGD) WITH PROPOFOL  N/A 01/06/2020   Procedure: ESOPHAGOGASTRODUODENOSCOPY (EGD) WITH PROPOFOL ;  Surgeon: Golda Claudis PENNER, MD;  [facility];  Service: Endoscopy;  Laterality: N/A;   HOT HEMOSTASIS N/A 05/03/2020   Procedure: HOT HEMOSTASIS (ARGON PLASMA COAGULATION/BICAP);  Surgeon: Rollin Dover, MD;  Location: [facility] ENDOSCOPY;  Service: Gastroenterology;  Laterality: N/A;   ICD IMPLANT N/A 09/04/2019   Procedure: ICD IMPLANT;  Surgeon: Waddell Danelle ORN, MD;  Location: [facility] INVASIVE CV LAB;  Service: Cardiovascular;  Laterality: N/A;   RIGHT/LEFT HEART CATH AND CORONARY ANGIOGRAPHY N/A 09/03/2019   Procedure: RIGHT/LEFT HEART CATH AND CORONARY ANGIOGRAPHY;  Surgeon: Cherrie Toribio SAUNDERS, MD;  [facility];  Service: Cardiovascular;  Laterality: N/A;   Social History:  reports that he has quit smoking. His smoking use included cigarettes. He has a 26 pack-year smoking history. He has never used smokeless tobacco. He reports that he does not drink alcohol and does not use drugs.  No Known Allergies  Family History  Problem Relation Age of Onset   Colon cancer Mother        thinks it was colon cancer   Cancer Mother        not sure location    Prostate cancer Father    Dementia Sister    Colon  cancer Brother    Stomach cancer Neg Hx     Prior to Admission medications   Medication Sig Start Date End Date Taking? Authorizing Provider  apixaban  (ELIQUIS ) 5 MG TABS tablet Take 1 tablet (5 mg total) by mouth 2 (two) times daily. Hold Eliquis  on 01/05/2024 for bronchoscopy 01/03/24  Yes Odell Celinda Balo, MD  atorvastatin  (LIPITOR ) 80 MG tablet Take 80 mg by mouth daily.   Yes [provider]   carvedilol  (COREG ) 3.125 MG tablet Take 1 tablet (3.125 mg total) by mouth daily with breakfast. 05/04/20 01/01/25 Yes Shona Terry SAILOR, DO  fentaNYL  (DURAGESIC ) 25 MCG/HR Place 1 patch onto the skin every 3 (three) days.   Yes [provider]  ferrous sulfate  (FERROUSUL) 325 (65 FE) MG tablet Take 1 tablet (325 mg total) by mouth daily with breakfast. 01/19/20  Yes Rehman, Claudis PENNER, MD  furosemide  (LASIX ) 20 MG tablet Take 20 mg by mouth daily.   Yes [provider]  levothyroxine (SYNTHROID) 50 MCG tablet Take 50 mcg by mouth daily before breakfast.   Yes [provider]  losartan  (COZAAR ) 25 MG tablet Take 25 mg by mouth daily. 01/14/21  Yes [provider]  nitroGLYCERIN  (NITROSTAT ) 0.4 MG SL tablet Place 1 tablet (0.4 mg total) under the tongue every 5 (five) minutes as needed for chest pain. Patient must keep upcoming appointment for further refills 07/20/21  Yes Court Dorn PARAS, MD  omeprazole  (PRILOSEC) 20 MG capsule Take 20 mg by mouth daily after lunch.   Yes [provider]  potassium chloride  SA (KLOR-CON ) 20 MEQ tablet Take 20 mEq by mouth daily after lunch.   Yes [provider]  spironolactone  (ALDACTONE ) 50 MG tablet Take 50 mg by mouth daily. 02/11/21  Yes [provider]  traMADol  (ULTRAM ) 50 MG tablet Take 1 tablet every 6-8 hours as needed for pain Patient taking differently: Take 50 mg by mouth every 6 (six) hours as needed for moderate pain (pain score 4-6) or severe pain (pain score 7-10). 01/08/24  Yes Burns, Delon BRAVO, NP  [employer] GENTLE LAXATIVE 5 MG EC tablet TAKE 1 TABLET BY MOUTH DAILY AS NEEDED FOR MODERATE CONSTIPATION. TAKE TWO DAYS BEFORE PROCEDURE Patient taking differently: Take 5 mg by mouth daily as needed for mild constipation or moderate constipation. 05/30/20   Eartha Angelia Sieving, MD  potassium chloride  (KLOR-CON ) 20 MEQ packet TAKE 1 TABLET BY MOUTH EVERY DAY 12/24/12 07/14/13  Court Dorn PARAS, MD     Physical Exam: Vitals:   01/13/24 1900 01/13/24 2000 01/13/24 2100 01/13/24 2108  BP: 112/69 119/64 128/66   Pulse: (!) 126 92 (!) 118   Resp: (!) 23 16 18    Temp:    98 F (36.7 C)  TempSrc:    Oral  SpO2: 95% 95% 93%   Weight:      Height:       *** Data Reviewed: {Tip this will not be part of the note when signed- Document your independent interpretation of telemetry tracing, EKG, lab, Radiology test or any other diagnostic tests. Add any new diagnostic test ordered today. (Optional):26781} {Results:26384}  Assessment and Plan: No notes have been filed under this hospital service. Service: Hospitalist     Advance Care Planning:   Code Status: Prior ***  Consults: ***  Family Communication: ***  Severity of Illness: {Observation/Inpatient:21159}  Author: Posey Maier, DO 01/13/2024 9:20 PM  For on call review [email or web address].

## 2024-01-13 NOTE — ED Triage Notes (Signed)
 Pt presents from home with c/o immobility, weakness, increased falls, pain to toes with possible infection, hip/back pain, incontinence; pt reports he is a new pt to the cancer center

## 2024-01-13 NOTE — Telephone Encounter (Signed)
 Sister Gaylan called stating that patient has become incontinent and immobile over the weekend with worsening pain.  Advised to call 911 to have him taken to the ER for evaluation.  Verbalized understanding.

## 2024-01-14 ENCOUNTER — Ambulatory Visit: Admitting: Internal Medicine

## 2024-01-14 DIAGNOSIS — N1831 Chronic kidney disease, stage 3a: Secondary | ICD-10-CM | POA: Insufficient documentation

## 2024-01-14 DIAGNOSIS — C3411 Malignant neoplasm of upper lobe, right bronchus or lung: Secondary | ICD-10-CM

## 2024-01-14 DIAGNOSIS — C7951 Secondary malignant neoplasm of bone: Secondary | ICD-10-CM | POA: Insufficient documentation

## 2024-01-14 DIAGNOSIS — I4819 Other persistent atrial fibrillation: Secondary | ICD-10-CM | POA: Insufficient documentation

## 2024-01-14 LAB — COMPREHENSIVE METABOLIC PANEL WITH GFR
ALT: 19 U/L (ref 0–44)
AST: 25 U/L (ref 15–41)
Albumin: 3.3 g/dL — ABNORMAL LOW (ref 3.5–5.0)
Alkaline Phosphatase: 99 U/L (ref 38–126)
Anion gap: 10 (ref 5–15)
BUN: 21 mg/dL (ref 8–23)
CO2: 30 mmol/L (ref 22–32)
Calcium: 11.6 mg/dL — ABNORMAL HIGH (ref 8.9–10.3)
Chloride: 98 mmol/L (ref 98–111)
Creatinine, Ser: 1.17 mg/dL (ref 0.61–1.24)
GFR, Estimated: >60 mL/min (ref >60)
Glucose, Bld: 119 mg/dL — ABNORMAL HIGH (ref 70–99)
Potassium: 4.2 mmol/L (ref 3.5–5.1)
Sodium: 138 mmol/L (ref 135–145)
Total Bilirubin: 0.5 mg/dL (ref 0.0–1.2)
Total Protein: 6.7 g/dL (ref 6.5–8.1)

## 2024-01-14 LAB — CBC
HCT: 32.3 % — ABNORMAL LOW (ref 39.0–52.0)
Hemoglobin: 9.9 g/dL — ABNORMAL LOW (ref 13.0–17.0)
MCH: 26.1 pg (ref 26.0–34.0)
MCHC: 30.7 g/dL (ref 30.0–36.0)
MCV: 85 fL (ref 80.0–100.0)
Platelets: 281 K/uL (ref 150–400)
RBC: 3.8 MIL/uL — ABNORMAL LOW (ref 4.22–5.81)
RDW: 15 % (ref 11.5–15.5)
WBC: 11.5 K/uL — ABNORMAL HIGH (ref 4.0–10.5)
nRBC: 0 % (ref 0.0–0.2)

## 2024-01-14 LAB — URINALYSIS, ROUTINE W REFLEX MICROSCOPIC
Bilirubin Urine: NEGATIVE
Glucose, UA: NEGATIVE mg/dL
Hgb urine dipstick: NEGATIVE
Ketones, ur: NEGATIVE mg/dL
Leukocytes,Ua: NEGATIVE
Nitrite: NEGATIVE
Protein, ur: NEGATIVE mg/dL
Specific Gravity, Urine: 1.021 (ref 1.005–1.030)
pH: 5 (ref 5.0–8.0)

## 2024-01-14 LAB — MAGNESIUM: Magnesium: 1.6 mg/dL — ABNORMAL LOW (ref 1.7–2.4)

## 2024-01-14 LAB — HEMOGLOBIN A1C
Hgb A1c MFr Bld: 6.8 % — ABNORMAL HIGH (ref 4.8–5.6)
Mean Plasma Glucose: 148.46 mg/dL

## 2024-01-14 LAB — PHOSPHORUS: Phosphorus: 2.7 mg/dL (ref 2.5–4.6)

## 2024-01-14 LAB — CBG MONITORING, ED
Glucose-Capillary: 210 mg/dL — ABNORMAL HIGH (ref 70–99)
Glucose-Capillary: 169 mg/dL — ABNORMAL HIGH (ref 70–99)

## 2024-01-14 LAB — GLUCOSE, CAPILLARY
Glucose-Capillary: 143 mg/dL — ABNORMAL HIGH (ref 70–99)
Glucose-Capillary: 150 mg/dL — ABNORMAL HIGH (ref 70–99)

## 2024-01-14 MED ORDER — INSULIN ASPART 100 UNIT/ML IJ SOLN
0.0000 [IU] | Freq: Three times a day (TID) | INTRAMUSCULAR | Status: DC
  Administered 2024-01-14: 3 [IU] via SUBCUTANEOUS
  Administered 2024-01-14: 1 [IU] via SUBCUTANEOUS
  Administered 2024-01-16: 3 [IU] via SUBCUTANEOUS
  Administered 2024-01-16: 1 [IU] via SUBCUTANEOUS
  Administered 2024-01-16 – 2024-01-18 (×6): 2 [IU] via SUBCUTANEOUS
  Administered 2024-01-19: 1 [IU] via SUBCUTANEOUS
  Filled 2024-01-14: qty 1
  Filled 2024-01-14 (×3): qty 2
  Filled 2024-01-14: qty 1
  Filled 2024-01-14 (×2): qty 2
  Filled 2024-01-14: qty 1
  Filled 2024-01-14 (×2): qty 2
  Filled 2024-01-14: qty 1

## 2024-01-14 MED ORDER — SODIUM CHLORIDE 0.9 % IV SOLN: INTRAVENOUS | Status: AC

## 2024-01-14 MED ORDER — LOSARTAN POTASSIUM 25 MG PO TABS: 25.0000 mg | ORAL_TABLET | Freq: Every day | ORAL | Status: DC

## 2024-01-14 MED ORDER — PROCHLORPERAZINE EDISYLATE 10 MG/2ML IJ SOLN: 10.0000 mg | Freq: Four times a day (QID) | INTRAMUSCULAR | Status: DC | PRN

## 2024-01-14 MED ORDER — LEVOTHYROXINE SODIUM 50 MCG PO TABS
50.0000 ug | ORAL_TABLET | Freq: Every day | ORAL | Status: DC
  Administered 2024-01-14 – 2024-01-19 (×6): 50 ug via ORAL
  Filled 2024-01-14 (×6): qty 1

## 2024-01-14 MED ORDER — DEXAMETHASONE SOD PHOSPHATE PF 10 MG/ML IJ SOLN: 10.0000 mg | INTRAMUSCULAR | Status: DC

## 2024-01-14 MED ORDER — ACETAMINOPHEN 325 MG PO TABS: 650.0000 mg | ORAL_TABLET | Freq: Four times a day (QID) | ORAL | Status: DC | PRN

## 2024-01-14 MED ORDER — ENSURE PLUS HIGH PROTEIN PO LIQD
237.0000 mL | Freq: Three times a day (TID) | ORAL | Status: DC
  Administered 2024-01-14 – 2024-01-19 (×7): 237 mL via ORAL
  Filled 2024-01-14 (×4): qty 237

## 2024-01-14 MED ORDER — ADULT MULTIVITAMIN W/MINERALS CH
1.0000 | ORAL_TABLET | Freq: Every day | ORAL | Status: DC
  Administered 2024-01-14 – 2024-01-19 (×6): 1 via ORAL
  Filled 2024-01-14 (×6): qty 1

## 2024-01-14 MED ORDER — ZOLEDRONIC ACID 4 MG/100ML IV SOLN
4.0000 mg | Freq: Once | INTRAVENOUS | Status: AC
  Administered 2024-01-14: 4 mg via INTRAVENOUS
  Filled 2024-01-14: qty 100

## 2024-01-14 MED ORDER — ACETAMINOPHEN 650 MG RE SUPP
650.0000 mg | Freq: Four times a day (QID) | RECTAL | Status: DC | PRN
Start: 2024-01-14 — End: 2024-01-19

## 2024-01-14 MED ORDER — HEPARIN SODIUM (PORCINE) 5000 UNIT/ML IJ SOLN: 5000.0000 [IU] | Freq: Three times a day (TID) | INTRAMUSCULAR | Status: DC

## 2024-01-14 MED ORDER — APIXABAN 5 MG PO TABS
5.0000 mg | ORAL_TABLET | Freq: Two times a day (BID) | ORAL | Status: DC
  Administered 2024-01-14: 5 mg via ORAL
  Filled 2024-01-14: qty 1

## 2024-01-14 MED ORDER — MAGNESIUM SULFATE 2 GM/50ML IV SOLN
2.0000 g | Freq: Once | INTRAVENOUS | Status: AC
  Administered 2024-01-14: 2 g via INTRAVENOUS
  Filled 2024-01-14: qty 50

## 2024-01-14 MED ORDER — ENSURE PLUS HIGH PROTEIN PO LIQD
237.0000 mL | Freq: Two times a day (BID) | ORAL | Status: DC
  Filled 2024-01-14 (×4): qty 237

## 2024-01-14 MED ORDER — MORPHINE SULFATE (PF) 2 MG/ML IV SOLN
2.0000 mg | INTRAVENOUS | Status: DC | PRN
  Administered 2024-01-15 – 2024-01-16 (×4): 2 mg via INTRAVENOUS
  Filled 2024-01-14 (×4): qty 1

## 2024-01-14 MED ORDER — CARVEDILOL 3.125 MG PO TABS
3.1250 mg | ORAL_TABLET | Freq: Every day | ORAL | Status: DC
  Administered 2024-01-14 – 2024-01-19 (×5): 3.125 mg via ORAL
  Filled 2024-01-14 (×5): qty 1

## 2024-01-14 MED ORDER — ATORVASTATIN CALCIUM 40 MG PO TABS
80.0000 mg | ORAL_TABLET | Freq: Every day | ORAL | Status: DC
  Administered 2024-01-14 – 2024-01-19 (×6): 80 mg via ORAL
  Filled 2024-01-14: qty 1
  Filled 2024-01-14 (×5): qty 2

## 2024-01-14 NOTE — Consult Note (Signed)
 Reason for Consult: Metastatic disease to the spine Referring Physician: Alm Tat  Peter Lambert is an 78 y.o. male.  HPI: The patient is an 78 year old individual who has evidence of metastatic disease throughout his spine.  He had a bronchoscopy for lung mass a few weeks.  He is had increasing back pain and hip pain involving the left lower extremity.  The CT scan of the chest abdomen pelvis demonstrates multiple bony lesions including 1 at the level of the sacrum.  He also has evidence of destructive lesions of T9 and T11.  An MRI of the thoracic spine shows that he has some moderate central canal stenosis at T11 with about 40% collapse of the vertebrae.  A smaller lesion at T9 produces some effacement of the canal from the right side but no overt compression has mild stenosis.  Past Medical History:  Diagnosis Date   Acute upper GI bleed    Anemia    Anticoagulated on apixaban     Cardiac arrest with ventricular fibrillation (HCC) 2021   CHF (congestive heart failure) (HCC)    Chronic a-fib (HCC)    Coronary artery disease    a. s/p NSTEMI in 2010 with DES to proximal LAD with D1 jailed and angioplasty alone to ostium   Dyspnea    GERD (gastroesophageal reflux disease)    Hyperlipidemia    Hypertension    Mediastinal lymphadenopathy    NSTEMI (non-ST elevated myocardial infarction) (HCC) 2010   Peripheral arterial disease    S/P coronary artery stent placement    S/P ICD (internal cardiac defibrillator) procedure    Tobacco abuse    Type 2 diabetes mellitus (HCC)     Past Surgical History:  Procedure Laterality Date   CARDIAC SURGERY     COLONOSCOPY WITH PROPOFOL  N/A 01/08/2020   Procedure: COLONOSCOPY WITH PROPOFOL ;  Surgeon: Cindie Carlin POUR, DO;  [facility];  Service: Endoscopy;  Laterality: N/A;   COLONOSCOPY WITH PROPOFOL  N/A 05/03/2020   Hung:entire examined colon normal   CORONARY STENT INTERVENTION N/A 09/03/2019   Procedure: CORONARY STENT  INTERVENTION;  Surgeon: Verlin Lonni BIRCH, MD;  [facility];  Service: Cardiovascular;  Laterality: N/A;   CORONARY STENT PLACEMENT     ENDOBRONCHIAL ULTRASOUND Bilateral 01/07/2024   Procedure: ENDOBRONCHIAL ULTRASOUND (EBUS);  Surgeon: Malka Domino, MD;  Location: [facility];  Service: Pulmonary;  Laterality: Bilateral;   ENTEROSCOPY N/A 05/03/2020   hung:normal esophagus, normal stomach. Three non-bleeding angiodysplastic lesions in the duodenum. Treated with a monopolar probe. A few non-bleeding angiodysplastic lesions in the jejunum. Treated with a monopolar probe. No specimens collected.   ESOPHAGOGASTRODUODENOSCOPY (EGD) WITH PROPOFOL  N/A 01/06/2020   Procedure: ESOPHAGOGASTRODUODENOSCOPY (EGD) WITH PROPOFOL ;  Surgeon: Golda Claudis PENNER, MD;  [facility];  Service: Endoscopy;  Laterality: N/A;   HOT HEMOSTASIS N/A 05/03/2020   Procedure: HOT HEMOSTASIS (ARGON PLASMA COAGULATION/BICAP);  Surgeon: Rollin Dover, MD;  [facility] ENDOSCOPY;  Service: Gastroenterology;  Laterality: N/A;   ICD IMPLANT N/A 09/04/2019   Procedure: ICD IMPLANT;  Surgeon: Waddell Danelle ORN, MD;  [facility]: [facility] INVASIVE CV LAB;  Service: Cardiovascular;  Laterality: N/A;   RIGHT/LEFT HEART CATH AND CORONARY ANGIOGRAPHY N/A 09/03/2019   Procedure: RIGHT/LEFT HEART CATH AND CORONARY ANGIOGRAPHY;  Surgeon: Cherrie Toribio SAUNDERS, MD;  [facility];  Service: Cardiovascular;  Laterality: N/A;    Family History  Problem Relation Age of Onset   Colon cancer Mother  thinks it was colon cancer   Cancer Mother        not sure location    Prostate cancer Father    Dementia Sister    Colon cancer Brother    Stomach cancer Neg Hx     Social History:  reports that he has quit smoking. His smoking use included cigarettes. He has a 26 pack-year smoking history. He has never used smokeless tobacco. He reports that he does not drink alcohol and does not use  drugs.  Allergies: No Known Allergies  Medications: I have reviewed the patient's current medications.  Results for orders placed or performed during the hospital encounter of 01/13/24 (from the past 48 hours)  CBG monitoring, ED     Status: None   Collection Time: 01/13/24  3:01 PM  Result Value Ref Range   Glucose-Capillary 74 70 - 99 mg/dL    Comment: Glucose reference range applies only to samples taken after fasting for at least 8 hours.  Comprehensive metabolic panel     Status: Abnormal   Collection Time: 01/13/24  3:05 PM  Result Value Ref Range   Sodium 139 135 - 145 mmol/L   Potassium 4.0 3.5 - 5.1 mmol/L   Chloride 98 98 - 111 mmol/L   CO2 30 22 - 32 mmol/L   Glucose, Bld 133 (H) 70 - 99 mg/dL    Comment: Glucose reference range applies only to samples taken after fasting for at least 8 hours.   BUN 20 8 - 23 mg/dL   Creatinine, Ser 8.84 0.61 - 1.24 mg/dL   Calcium  11.9 (H) 8.9 - 10.3 mg/dL   Total Protein 7.2 6.5 - 8.1 g/dL   Albumin 3.5 3.5 - 5.0 g/dL   AST 26 15 - 41 U/L   ALT 23 0 - 44 U/L   Alkaline Phosphatase 111 38 - 126 U/L   Total Bilirubin 0.5 0.0 - 1.2 mg/dL   GFR, Estimated >39 >39 mL/min    Comment: (NOTE) Calculated using the CKD-EPI Creatinine Equation (2021)    Anion gap 11 5 - 15    Comment: Performed at [facility], [address], [place], [place] [address]  CBC     Status: Abnormal   Collection Time: 01/13/24  3:05 PM  Result Value Ref Range   WBC 14.6 (H) 4.0 - 10.5 K/uL   RBC 4.01 (L) 4.22 - 5.81 MIL/uL   Hemoglobin 10.6 (L) 13.0 - 17.0 g/dL   HCT 66.2 (L) 60.9 - 47.9 %   MCV 84.0 80.0 - 100.0 fL   MCH 26.4 26.0 - 34.0 pg   MCHC 31.5 30.0 - 36.0 g/dL   RDW 84.8 88.4 - 84.4 %   Platelets 365 150 - 400 K/uL   nRBC 0.0 0.0 - 0.2 %    Comment: Performed at [facility], [address] [place], [place] [address]  Comprehensive metabolic panel     Status: Abnormal   Collection Time: 01/14/24  2:32 AM  Result Value Ref Range   Sodium 138  135 - 145 mmol/L   Potassium 4.2 3.5 - 5.1 mmol/L   Chloride 98 98 - 111 mmol/L   CO2 30 22 - 32 mmol/L   Glucose, Bld 119 (H) 70 - 99 mg/dL    Comment: Glucose reference range applies only to samples taken after fasting for at least 8 hours.   BUN 21 8 - 23 mg/dL   Creatinine, Ser 8.82 0.61 - 1.24 mg/dL   Calcium  11.6 (  H) 8.9 - 10.3 mg/dL   Total Protein 6.7 6.5 - 8.1 g/dL   Albumin 3.3 (L) 3.5 - 5.0 g/dL   AST 25 15 - 41 U/L   ALT 19 0 - 44 U/L   Alkaline Phosphatase 99 38 - 126 U/L   Total Bilirubin 0.5 0.0 - 1.2 mg/dL   GFR, Estimated >39 >39 mL/min    Comment: (NOTE) Calculated using the CKD-EPI Creatinine Equation (2021)    Anion gap 10 5 - 15    Comment: Performed at [facility], [address], [place], [place] [address]  CBC     Status: Abnormal   Collection Time: 01/14/24  2:32 AM  Result Value Ref Range   WBC 11.5 (H) 4.0 - 10.5 K/uL   RBC 3.80 (L) 4.22 - 5.81 MIL/uL   Hemoglobin 9.9 (L) 13.0 - 17.0 g/dL   HCT 67.6 (L) 60.9 - 47.9 %   MCV 85.0 80.0 - 100.0 fL   MCH 26.1 26.0 - 34.0 pg   MCHC 30.7 30.0 - 36.0 g/dL   RDW 84.9 88.4 - 84.4 %   Platelets 281 150 - 400 K/uL   nRBC 0.0 0.0 - 0.2 %    Comment: Performed at [facility], [address] [place], [place] [address]  Magnesium      Status: Abnormal   Collection Time: 01/14/24  2:32 AM  Result Value Ref Range   Magnesium  1.6 (L) 1.7 - 2.4 mg/dL    Comment: Performed at [facility], [address] [place], [place] [address]  Phosphorus     Status: None   Collection Time: 01/14/24  2:32 AM  Result Value Ref Range   Phosphorus 2.7 2.5 - 4.6 mg/dL    Comment: Performed at [facility], [address] [place], [place] [address]  Hemoglobin A1c     Status: Abnormal   Collection Time: 01/14/24  2:32 AM  Result Value Ref Range   Hgb A1c MFr Bld 6.8 (H) 4.8 - 5.6 %    Comment: (NOTE) Diagnosis of Diabetes The following HbA1c ranges recommended by the American Diabetes Association (ADA) may be used as an aid  in the diagnosis of diabetes mellitus.  Hemoglobin             Suggested A1C NGSP%              Diagnosis  <5.7                   Non Diabetic  5.7-6.4                Pre-Diabetic  >6.4                   Diabetic  <7.0                   Glycemic control for                       adults with diabetes.     Mean Plasma Glucose 148.46 mg/dL    Comment: Performed at [facility], [address] [place], [place] [address]  Urinalysis, Routine w reflex microscopic -Urine, Clean Catch     Status: None   Collection Time: 01/14/24  6:20 AM  Result Value Ref Range   Color, Urine YELLOW YELLOW   APPearance CLEAR CLEAR   Specific Gravity, Urine 1.021 1.005 - 1.030   pH 5.0 5.0 - 8.0   Glucose, UA  NEGATIVE NEGATIVE mg/dL   Hgb urine dipstick NEGATIVE NEGATIVE   Bilirubin Urine NEGATIVE NEGATIVE   Ketones, ur NEGATIVE NEGATIVE mg/dL   Protein, ur NEGATIVE NEGATIVE mg/dL   Nitrite NEGATIVE NEGATIVE   Leukocytes,Ua NEGATIVE NEGATIVE    Comment: Performed at [facility], [address] [place], [place] [address]  CBG monitoring, ED     Status: Abnormal   Collection Time: 01/14/24  9:56 AM  Result Value Ref Range   Glucose-Capillary 210 (H) 70 - 99 mg/dL    Comment: Glucose reference range applies only to samples taken after fasting for at least 8 hours.  CBG monitoring, ED     Status: Abnormal   Collection Time: 01/14/24  2:32 PM  Result Value Ref Range   Glucose-Capillary 169 (H) 70 - 99 mg/dL    Comment: Glucose reference range applies only to samples taken after fasting for at least 8 hours.  Glucose, capillary     Status: Abnormal   Collection Time: 01/14/24  6:39 PM  Result Value Ref Range   Glucose-Capillary 143 (H) 70 - 99 mg/dL    Comment: Glucose reference range applies only to samples taken after fasting for at least 8 hours.    MR Lumbar Spine W Wo Contrast Result Date: 01/13/2024 EXAM: MRI LUMBAR SPINE 01/13/2024 06:58:11 PM TECHNIQUE: Multiplanar multisequence  MRI of the lumbar spine was performed with and without the administration of intravenous contrast. 6 mL of gadobutrol (GADAVIST) 1 MMOL/ML injection was administered. COMPARISON: PET CT 01/09/2024. CT abdomen and pelvis 01/01/2024. CLINICAL HISTORY: Myelopathy, acute, lumbar spine. FINDINGS: BONES AND ALIGNMENT: 5 lumbar type vertebrae. Normal alignment. Multiple T1 hypointense, enhancing lesions consistent with previously shown metastases. Numerous small lesions are scattered throughout the vertebral bodies and posterior elements of the lumbar spine as well as a larger, 2 cm lesion in the L1 vertebral body. Preserved lumbar vertebral body heights. 4 cm destructive lesion involving the anterior sacrum at S1 on the right with extraosseous tumor potentially affecting the extraforaminal right L5 nerve. Bilateral iliac lesions. Mild multilevel neural foraminal stenosis. SPINAL CORD: The conus medullaris terminates at L1-L2 and is normal in signal. No evidence of epidural tumor in the lumbar spine. SOFT TISSUES: Status post aortobifemoral bypass. DISC LEVELS: Mild diffuse lumbar spondylosis and moderate facet hypertrophy without significant spinal stenosis. Mild multilevel neural foraminal stenosis. IMPRESSION: 1. Known widespread osseous metastases including a destructive 4 cm S1 lesion. 2. No evidence of epidural tumor or spinal stenosis in the lumbar spine. Electronically signed by: Dasie Hamburg MD 01/13/2024 08:00 PM EST RP Workstation: [id number]   MR THORACIC SPINE W WO CONTRAST Result Date: 01/13/2024 EXAM: MRI THORACIC SPINE WITH AND WITHOUT INTRAVENOUS CONTRAST 01/13/2024 06:58:11 PM TECHNIQUE: Multiplanar multisequence MRI of the thoracic spine was performed with and without the administration of intravenous contrast. COMPARISON: PET CT 01/09/2024. CTA chest and CT abdomen and pelvis 01/01/2024. CLINICAL HISTORY: Mid-back pain, neuro deficit; known T11 mass. FINDINGS: BONES AND ALIGNMENT: Normal alignment.  Multiple T1 hypointense, enhancing lesions consistent with previously shown metastases, including involvement of the T6 and T9 to T11 vertebral bodies as well as destructive lesions of the posterior right 3rd and 8th ribs. The T9 lesion extends into the right pedicle, and the T11 lesion involves the primarily left sided posterior elements. Pathologic compression fractures with 10% vertebral body height loss at T9 and 45% central vertebral body height loss at T11. Right sided ventral and lateral epidural tumor at T9 results in mild spinal stenosis and encroaches upon  the medial aspect of the right T9-T10 neural foramen. There is a larger amount of ventral and lateral epidural tumor bilaterally at T11 which extends superiorly to T10 and results in moderate spinal stenosis without cord compression. Tumor also encroaches upon the left sided neural foramina at T11-T12 greater than T10-T11. SPINAL CORD: Normal spinal cord signal. SOFT TISSUES: Known right upper lobe lung mass. DEGENERATIVE CHANGES: Mild thoracic spondylosis without significant degenerative spinal stenosis. LUNGS: IMPRESSION: 1. Known widespread osseous metastases. 2. Pathologic compression fractures with 10% height loss at T9 and 45% height loss at T11. 3. Epidural tumor at T11 resulting in moderate spinal stenosis. 4. Smaller volume epidural tumor at T9 resulting in mild spinal stenosis. Electronically signed by: Dasie Hamburg MD 01/13/2024 07:50 PM EST RP Workstation: [id number]   DG Chest 1 View Result Date: 01/13/2024 CLINICAL DATA:  Weakness, immobility, recent falls, pain to toes with possible infection. EXAM: DG CHEST 1V COMPARISON:  January 01, 2024 FINDINGS: There is stable dual lead AICD positioning. The heart size and mediastinal contours are within normal limits. Mild right upper lobe scarring and/or atelectasis is seen. Adjacent ill-defined right upper lobe opacities are noted which correspond to right upper lobe suprahilar and pleural  based mass is seen on recent chest CT (January 01, 2024). No pleural effusion or pneumothorax is identified. Cortical destruction of the anterior aspect of the second right rib is seen. IMPRESSION: 1. Mild right upper lobe scarring and/or atelectasis. 2. Right upper lobe suprahilar and pleural based mass lesions, as described above. 3. Cortical destruction of the anterior aspect of the second right rib. Electronically Signed   By: Suzen Dials M.D.   On: 01/13/2024 16:22    Review of Systems  Musculoskeletal:  Positive for back pain and gait problem.  Neurological:  Positive for weakness and numbness.  All other systems reviewed and are negative.  Blood pressure 129/62, pulse 66, temperature 97.7 F (36.5 C), resp. rate 18, height 5' 8 (1.727 m), weight 64.4 kg, SpO2 100%. Physical Exam Constitutional:      Appearance: Normal appearance. He is normal weight.  HENT:     Head: Normocephalic and atraumatic.     Right Ear: Tympanic membrane, ear canal and external ear normal.     Left Ear: Tympanic membrane, ear canal and external ear normal.     Nose: Nose normal.     Mouth/Throat:     Mouth: Mucous membranes are moist.     Pharynx: Oropharynx is clear.  Eyes:     Extraocular Movements: Extraocular movements intact.     Conjunctiva/sclera: Conjunctivae normal.     Pupils: Pupils are equal, round, and reactive to light.  Neurological:     Mental Status: He is alert.     Comments: 4 out of 5 strength in the iliopsoas and the quads bilaterally tibialis anterior is 4 out of 5 on the right 4- out of 5 on the left plantar flexor strength is 5 out of 5 bilaterally absent reflexes in the Achilles and the patellae sensation is intact on the dorsum of the foot with hyperesthesia on the left side from the level of the knee down to the foot.  Upper extremity strength is 5 out of 5 in the deltoids biceps triceps grips and intrinsics cranial nerve examination is within the limits of normal.   Psychiatric:        Mood and Affect: Mood normal.        Behavior: Behavior normal.  Thought Content: Thought content normal.        Judgment: Judgment normal.     Assessment/Plan: Widely metastatic disease to the spine with moderate compression at the singular worst level of T11 mild compression at the level of T9.  Evidence of infiltration in the sacrum.  Plan: At the current time the patient's neurologic exam shows that he can move his legs quite well he does have dysesthetic sensation over the dorsum of the left foot.  He complains of pain in the left calf I have advised that it would be best to treat him conservatively and I believe that consultation with medical oncology and radiation oncology should be performed however I would hold off on any surgical intervention given his multiple medical issues and generally good performance level at this time.  Peter Lambert 01/14/2024, 7:30 PM

## 2024-01-14 NOTE — TOC Initial Note (Signed)
 Transition of Care [facility]) - Initial/Assessment Note    Patient Details  Name: Peter Lambert MRN: [id number] Date of Birth: 08/30/1945  Transition of Care [facility] CM/SW Contact:    Lucie Lunger, LCSWA Phone Number: 01/14/2024, 9:36 AM  Clinical Narrative:                 Pt is high risk for readmission. CSW spoke with pts son to complete assessment. Pt lives alone and has some family that comes to assist when they are able. Pts son states pt has not been able to complete ADLs without assistance. Pt has a cane and walker in the home. Pt has home O2. Pt does not have any HH services at this time. TOC to follow.   Expected Discharge Plan: Home w Home Health Services Barriers to Discharge: Continued Medical Work up   Patient Goals and CMS Choice Patient states their goals for this hospitalization and ongoing recovery are:: get better CMS Medicare.gov Compare Post Acute Care list provided to:: Patient Choice offered to / list presented to : Patient, Adult Children      Expected Discharge Plan and Services In-house Referral: Clinical Social Work Discharge Planning Services: CM Consult   Living arrangements for the past 2 months: Single Family Home                                      Prior Living Arrangements/Services Living arrangements for the past 2 months: Single Family Home Lives with:: Self Patient language and need for interpreter reviewed:: Yes Do you feel safe going back to the place where you live?: Yes      Need for Family Participation in Patient Care: Yes (Comment) Care giver support system in place?: Yes (comment)   Criminal Activity/Legal Involvement Pertinent to Current Situation/Hospitalization: No - Comment as needed  Activities of Daily Living      Permission Sought/Granted                  Emotional Assessment Appearance:: Appears stated age       Alcohol / Substance Use: Not Applicable Psych Involvement: No (comment)  Admission  diagnosis:  Lower extremity weakness [R29.898] Patient Active Problem List   Diagnosis Date Noted   Chronic kidney disease, stage 3a (HCC) 01/14/2024   Metastatic cancer to bone (HCC) 01/14/2024   Persistent atrial fibrillation (HCC) 01/14/2024   Lower extremity weakness 01/13/2024   Chest pain 01/02/2024   Lung mass 12/27/2023   Shortness of breath 12/27/2023   Pain in hip 12/27/2023   History of iron deficiency anemia 12/29/2020   AKI (acute kidney injury) 04/30/2020   Angiodysplasia of small intestine 04/30/2020   Iron deficiency anemia 01/19/2020   Constipation 01/19/2020   Paraesophageal hernia    Accidental fall 01/05/2020   Prolonged QT interval 01/05/2020   Atrial fibrillation, chronic (HCC) 01/05/2020   GI bleed 01/04/2020   Orthostatic hypotension 01/04/2020   Acute blood loss anemia 01/04/2020   Generalized weakness 01/04/2020   Hyponatremia 01/04/2020   Hyperkalemia 01/04/2020   CAD (coronary artery disease) 01/04/2020   HFrEF (heart failure with reduced ejection fraction) (HCC) 01/04/2020   Acute renal failure 09/02/2019   Hypokalemia 09/02/2019   Hyperglycemia 09/02/2019   Hypoalbuminemia 09/02/2019   Elevated brain natriuretic peptide (BNP) level 09/02/2019   Lactic acidosis 09/02/2019   Leukocytosis 09/02/2019   Proteinuria 09/02/2019   Hypomagnesemia 09/02/2019   NSTEMI (  non-ST elevated myocardial infarction) (HCC) 09/02/2019   CKD (chronic kidney disease), stage III b 09/02/2019   PVC's (premature ventricular contractions) 09/02/2019   NSVT (nonsustained ventricular tachycardia) (HCC) 09/02/2019   Thrombocytopenia 09/02/2019   Anemia 09/02/2019   Severe sepsis with septic shock (HCC) suspected on admission, source: ? aspiration 09/02/2019   Acute metabolic encephalopathy 09/02/2019   Acute systolic CHF (congestive heart failure) (HCC) 09/02/2019   Anticoagulated 09/02/2019   History of noncompliance with medical treatment 09/02/2019   Cardiac arrest  with ventricular fibrillation (HCC)    Acute on chronic respiratory failure with hypoxia (HCC) 04/23/2019   Lobar pneumonia 04/22/2019   Aspiration pneumonia [facility]), bilateral    Demand ischemia of myocardium (HCC)    Atrial flutter (HCC)    Acute on chronic diastolic heart failure (HCC)    Acute respiratory failure with hypoxemia (HCC) 07/02/2016   COPD (chronic obstructive pulmonary disease) (HCC) 07/02/2016   Tobacco use 07/17/2013   Peripheral arterial disease (HCC) 12/03/2012   Coronary artery disease s/p DES to LAD in 2010, s/p PCI to proximal LAD stenosis with DES this hospitalization 12/03/2012   Essential hypertension 12/03/2012   Hyperlipidemia 12/03/2012   Type 2 diabetes mellitus (HCC) 12/03/2012   PCP:  Marvine Rush, MD Pharmacy:   [facility] [phone] - [place], [place] - [address] AT [facility] [address] [place] [address] Phone: [phone] Fax: [phone]     Social Drivers of Health (SDOH) Social History: SDOH Screenings   Food Insecurity: Patient Declined (01/02/2024)  Housing: Patient Declined (01/02/2024)  Transportation Needs: Patient Declined (01/02/2024)  Utilities: Patient Declined (01/02/2024)  Depression (PHQ2-9): Low Risk  (12/27/2023)  Social Connections: Patient Declined (01/02/2024)  Tobacco Use: Medium Risk (01/13/2024)   SDOH Interventions:     Readmission Risk Interventions    01/14/2024    9:18 AM  Readmission Risk Prevention Plan  Transportation Screening Complete  HRI or Home Care Consult Complete  Social Work Consult for Recovery Care Planning/Counseling Complete  Palliative Care Screening Not Applicable  Medication Review Oceanographer) Complete

## 2024-01-14 NOTE — Hospital Course (Addendum)
 Brief Narrative:  78 year old with history of tobacco use, HLD, HTN, DM 2, persistent A-fib on Eliquis , CAD with PCI 2010, V-fib arrest 2021 from occluded RCA status post ICD, PAD with history of aortofemoral bypass grafting came to the hospital with functional decline over the last several weeks and worsening back pain.  He was recently diagnosed of lung cancer, underwent bronchoscopy on 11/11 and found to have squamous cell cancer.   Assessment & Plan:  Squamous cell carcinoma of lung with bone and spine metastases Compression thoracic spine fractures, pathologic -Patient underwent bronchoscopy 11/11 showing lung mass and biopsy consistent with squamous cell carcinoma.  Patient follows at [facility].  Over the last several days he has had functional decline with lower back pain and weakness.  MRI of thoracic and lumbar spine showed widespread spinal lesions with pathologic compression of T9 and T11 and epidural tumor at T11 with moderate spinal stenosis and a sacral lesion.  Patient was seen by neurosurgery who recommended against surgical intervention at this point  -Radiation oncology consulted = CT sim done, started radiation on 11/21 -Plan to place [place]. MRI Brain will be outptn (will need to be scheduled due to AICD)  Hypercalcemia of malignancy In the setting of underlying malignancy.  Initial calcium  over 12.  After Zometa  and IV fluids this is improved.  Will continue to daily monitor calcium  levels   Hypomagnesemia  - Replete and continue to monitor   Persistent atrial fibrillation  -On Eliquis , Coreg .  IV as needed   Essential hypertension  -On Coreg .  IV as needed   COPD  -Currently stable.  Will order as needed bronchodilators   Coronary disease status post PCI Overall appears to be stable, does not have any chest pain.  Will continue Eliquis , statin and Coreg    Diabetes mellitus type 2  -continue sliding scale    Chronic HFpEF  -Clinically appears to  be euvolemic.  Echocardiogram on 01/02/2024 showed preserved EF.   Mixed hyperlipidemia  -Continue Lipitor    Hypothyroidism  -Continue Synthroid    Tobacco abuse -  Tobacco cessation discussed   DVT prophylaxis: SCDs Start: 01/14/24 0146 apixaban  (ELIQUIS ) tablet 5 mg      Code Status: Full Code Family Communication:   Status is: Inpatient Remains inpatient appropriate because: On going radiation   PT Follow up Recs: Home Health Pt11/19/2025 0800  Subjective: DIL and daughter at bedside Radiation today    Examination:  General exam: Appears calm and comfortable  Respiratory system: Clear to auscultation. Respiratory effort normal. Cardiovascular system: S1 & S2 heard, RRR. No JVD, murmurs, rubs, gallops or clicks. No pedal edema. Gastrointestinal system: Abdomen is nondistended, soft and nontender. No organomegaly or masses felt. Normal bowel sounds heard. Central nervous system: Alert and oriented. No focal neurological deficits. Extremities: Symmetric 5 x 5 power. Skin: No rashes, lesions or ulcers Psychiatry: Judgement and insight appear normal. Mood & affect appropriate.

## 2024-01-14 NOTE — Progress Notes (Addendum)
 PROGRESS NOTE  Carry Ortez Lambert FMW:[id number] DOB: 1945-12-25 DOA: 01/13/2024 PCP: Marvine Rush, MD  Brief History:  78 year old male with a history of for tobacco abuse, hypertension, hyperlipidemia, history of  CKD, HFimpEF, COPD, diabetes mellitus type 2, tobacco abuse, GERD, persistent atrial fibrillation on apixaban ,  CAD s/p DES to LAD in 2010, V-fib arrest in 2021 in setting of occluded RCA s/p ICD,  PAD with remote history of aortofemoral bypass grafting who was brought to the emergency department from home by a family member due to functional declineover the last couple of weeks.  Previously, patient has been ambulating with a cane and sometimes needs a walker, but in the last few days, he has not been able to ambulate and has been complaining of pain in both feet which is worse at the great toes.  He has been complaining of leg weakness, left greater than right.  He has had worsening generalized weakness resulting in increasing number of falls. Patient denies fevers, chills, headache, chest pain, dyspnea, nausea, vomiting, diarrhea, abdominal pain, dysuria, hematuria, hematochezia, and melena.   Patient had a recent hospital admission from 01/01/2024 to 01/03/2024 When he presented from the cardiology office where he was being seen for expedited preoperative evaluation for microscopy due to lung mass.  Patient had been having chest pain relieved by nitroglycerin .  Cardiology felt His story is ambiguous with chest pain not consistent with typical angina.  Echocardiogram was reassuring.  He was discharged home with Imdur and cleared for bronchoscopy  He underwent bronchoscopy with EBUS on 01/07/2024 for his right upper lobe mass.  Bronchoscopy showed Bronchus hypervascularized, narrowed anterior segment, no clear endobronchial lesions.   In the ED, the patient was afebrile hemodynamic stable with oxygen  saturation 99% room air.  WBC 14.6, platelets 10.6, platelets 365.  Sodium 139,  potassium 4.0, bicarbonate 30, serum creatinine 1.5.  Corrected calcium  was 12.3.    Assessment/Plan: Lower extremity weakness/metastatic lung cancer to the bone and spine - 01/13/2024 MR T-spine--known widespread metastatic osseous mets; epidural tumor at T11 with moderate spinal stenosis.  Smaller epidural lesion at T9 with mild spinal stenosis - 01/13/2024 MR L-spine--notable widespread osseous metastasis.  No evidence of epidural tumor -EDP spoke with neurosurgery, Dr. Bonifacio consult after transfer to [facility] - Judicious opioids  Hypercalcemia of malignancy - Presented with corrected calcium  12.3 - Give dose of Zometa - Start IV fluids  Hypomagnesemia - Replete  Persistent atrial fibrillation - Holding apixaban  in anticipation for any procedures  Essential hypertension - Continue carvedilol  - Holding losartan   COPD - Stable on room air - Start bronchodilators  Coronary disease - No chest pain presently  Diabetes mellitus type 2 - Recheck hemoglobin A1c - 04/30/2020 hemoglobin A1c 6.1 - NovoLog  sliding scale--anticipate elevated CBGs secondary to steroids  Chronic HFpEF - Clinically euvolemic - 01/02/2024 echo EF 60 to 65%, no WMA, normal RVF - Continue carvedilol   Mixed hyperlipidemia - Continue statin  Hypothyroidism - TSH  Tobacco abuse - Tobacco cessation discussed          Family Communication:  no Family at bedside  Consultants:  neurosurgery  Code Status:    DVT Prophylaxis:  apixaban  on hold   Procedures: As Listed in Progress Note Above  Antibiotics: None       Subjective: Patient denies fevers, chills, headache, chest pain, dyspnea, nausea, vomiting, diarrhea, abdominal pain, dysuria, hematuria, hematochezia, and melena.   Objective: Vitals:   01/14/24 0200 01/14/24  0315 01/14/24 0345 01/14/24 0623  BP:  125/70 (!) 128/92   Pulse:  86 91   Resp: 18 18 18    Temp:    98.1 F (36.7 C)  TempSrc:      SpO2: 99% 98% 94%    Weight:      Height:       No intake or output data in the 24 hours ending 01/14/24 0742 Weight change:  Exam:  General:  Pt is alert, follows commands appropriately, not in acute distress HEENT: No icterus, No thrush, No neck mass, [place]/AT Cardiovascular: IRRR, S1/S2, no rubs, no gallops Respiratory: Bibasilar rales.  No wheezing.  Good air movement Abdomen: Soft/+BS, non tender, non distended, no guarding Extremities: No edema, No lymphangitis, No petechiae, No rashes, no synovitis Neuro:  CN II-XII intact, strength 4/5 in RUE, 4-/5 RLE, strength 4/5 LUE, 4-/5 LLE; sensation intact bilateral; no dysmetria; babinski equivocal    Data Reviewed: I have personally reviewed following labs and imaging studies Basic Metabolic Panel: Recent Labs  Lab 01/13/24 1505 01/14/24 0232  NA 139 138  K 4.0 4.2  CL 98 98  CO2 30 30  GLUCOSE 133* 119*  BUN 20 21  CREATININE 1.15 1.17  CALCIUM  11.9* 11.6*  MG  --  1.6*  PHOS  --  2.7   Liver Function Tests: Recent Labs  Lab 01/13/24 1505 01/14/24 0232  AST 26 25  ALT 23 19  ALKPHOS 111 99  BILITOT 0.5 0.5  PROT 7.2 6.7  ALBUMIN 3.5 3.3*   No results for input(s): LIPASE, AMYLASE in the last 168 hours. No results for input(s): AMMONIA in the last 168 hours. Coagulation Profile: No results for input(s): INR, PROTIME in the last 168 hours. CBC: Recent Labs  Lab 01/13/24 1505 01/14/24 0232  WBC 14.6* 11.5*  HGB 10.6* 9.9*  HCT 33.7* 32.3*  MCV 84.0 85.0  PLT 365 281   Cardiac Enzymes: No results for input(s): CKTOTAL, CKMB, CKMBINDEX, TROPONINI in the last 168 hours. BNP: Invalid input(s): POCBNP CBG: Recent Labs  Lab 01/13/24 1501  GLUCAP 74   HbA1C: No results for input(s): HGBA1C in the last 72 hours. Urine analysis:    Component Value Date/Time   COLORURINE YELLOW 01/14/2024 0620   APPEARANCEUR CLEAR 01/14/2024 0620   LABSPEC 1.021 01/14/2024 0620   PHURINE 5.0 01/14/2024 0620    GLUCOSEU NEGATIVE 01/14/2024 0620   HGBUR NEGATIVE 01/14/2024 0620   BILIRUBINUR NEGATIVE 01/14/2024 0620   KETONESUR NEGATIVE 01/14/2024 0620   PROTEINUR NEGATIVE 01/14/2024 0620   NITRITE NEGATIVE 01/14/2024 0620   LEUKOCYTESUR NEGATIVE 01/14/2024 0620   Sepsis Labs: @LABRCNTIP (procalcitonin:4,lacticidven:4) )No results found for this or any previous visit (from the past 240 hours).   Scheduled Meds:  apixaban   5 mg Oral BID   atorvastatin   80 mg Oral Daily   carvedilol   3.125 mg Oral Q breakfast   feeding supplement  237 mL Oral BID BM   levothyroxine  50 mcg Oral QAC breakfast   losartan   25 mg Oral Daily   Continuous Infusions:  Procedures/Studies: MR Lumbar Spine W Wo Contrast Result Date: 01/13/2024 EXAM: MRI LUMBAR SPINE 01/13/2024 06:58:11 PM TECHNIQUE: Multiplanar multisequence MRI of the lumbar spine was performed with and without the administration of intravenous contrast. 6 mL of gadobutrol (GADAVIST) 1 MMOL/ML injection was administered. COMPARISON: PET CT 01/09/2024. CT abdomen and pelvis 01/01/2024. CLINICAL HISTORY: Myelopathy, acute, lumbar spine. FINDINGS: BONES AND ALIGNMENT: 5 lumbar type vertebrae. Normal alignment. Multiple T1 hypointense, enhancing  lesions consistent with previously shown metastases. Numerous small lesions are scattered throughout the vertebral bodies and posterior elements of the lumbar spine as well as a larger, 2 cm lesion in the L1 vertebral body. Preserved lumbar vertebral body heights. 4 cm destructive lesion involving the anterior sacrum at S1 on the right with extraosseous tumor potentially affecting the extraforaminal right L5 nerve. Bilateral iliac lesions. Mild multilevel neural foraminal stenosis. SPINAL CORD: The conus medullaris terminates at L1-L2 and is normal in signal. No evidence of epidural tumor in the lumbar spine. SOFT TISSUES: Status post aortobifemoral bypass. DISC LEVELS: Mild diffuse lumbar spondylosis and moderate facet  hypertrophy without significant spinal stenosis. Mild multilevel neural foraminal stenosis. IMPRESSION: 1. Known widespread osseous metastases including a destructive 4 cm S1 lesion. 2. No evidence of epidural tumor or spinal stenosis in the lumbar spine. Electronically signed by: Dasie Hamburg MD 01/13/2024 08:00 PM EST RP Workstation: [id number]   MR THORACIC SPINE W WO CONTRAST Result Date: 01/13/2024 EXAM: MRI THORACIC SPINE WITH AND WITHOUT INTRAVENOUS CONTRAST 01/13/2024 06:58:11 PM TECHNIQUE: Multiplanar multisequence MRI of the thoracic spine was performed with and without the administration of intravenous contrast. COMPARISON: PET CT 01/09/2024. CTA chest and CT abdomen and pelvis 01/01/2024. CLINICAL HISTORY: Mid-back pain, neuro deficit; known T11 mass. FINDINGS: BONES AND ALIGNMENT: Normal alignment. Multiple T1 hypointense, enhancing lesions consistent with previously shown metastases, including involvement of the T6 and T9 to T11 vertebral bodies as well as destructive lesions of the posterior right 3rd and 8th ribs. The T9 lesion extends into the right pedicle, and the T11 lesion involves the primarily left sided posterior elements. Pathologic compression fractures with 10% vertebral body height loss at T9 and 45% central vertebral body height loss at T11. Right sided ventral and lateral epidural tumor at T9 results in mild spinal stenosis and encroaches upon the medial aspect of the right T9-T10 neural foramen. There is a larger amount of ventral and lateral epidural tumor bilaterally at T11 which extends superiorly to T10 and results in moderate spinal stenosis without cord compression. Tumor also encroaches upon the left sided neural foramina at T11-T12 greater than T10-T11. SPINAL CORD: Normal spinal cord signal. SOFT TISSUES: Known right upper lobe lung mass. DEGENERATIVE CHANGES: Mild thoracic spondylosis without significant degenerative spinal stenosis. LUNGS: IMPRESSION: 1. Known widespread  osseous metastases. 2. Pathologic compression fractures with 10% height loss at T9 and 45% height loss at T11. 3. Epidural tumor at T11 resulting in moderate spinal stenosis. 4. Smaller volume epidural tumor at T9 resulting in mild spinal stenosis. Electronically signed by: Dasie Hamburg MD 01/13/2024 07:50 PM EST RP Workstation: [id number]   DG Chest 1 View Result Date: 01/13/2024 CLINICAL DATA:  Weakness, immobility, recent falls, pain to toes with possible infection. EXAM: DG CHEST 1V COMPARISON:  January 01, 2024 FINDINGS: There is stable dual lead AICD positioning. The heart size and mediastinal contours are within normal limits. Mild right upper lobe scarring and/or atelectasis is seen. Adjacent ill-defined right upper lobe opacities are noted which correspond to right upper lobe suprahilar and pleural based mass is seen on recent chest CT (January 01, 2024). No pleural effusion or pneumothorax is identified. Cortical destruction of the anterior aspect of the second right rib is seen. IMPRESSION: 1. Mild right upper lobe scarring and/or atelectasis. 2. Right upper lobe suprahilar and pleural based mass lesions, as described above. 3. Cortical destruction of the anterior aspect of the second right rib. Electronically Signed   By: Suzen Dwane HERO.D.  On: 01/13/2024 16:22   NM PET Image Initial (PI) Skull Base To Thigh Result Date: 01/12/2024 CLINICAL DATA:  Initial treatment strategy for right upper lobe lung mass with evidence of metastatic disease prior CT. EXAM: NUCLEAR MEDICINE PET SKULL BASE TO THIGH TECHNIQUE: 7.32 mCi F-18 FDG was injected intravenously. Full-ring PET imaging was performed from the skull base to thigh after the radiotracer. CT data was obtained and used for attenuation correction and anatomic localization. Fasting blood glucose: 107 mg/dl COMPARISON:  CT of the chest, abdomen and pelvis 12/22/2023 and 01/01/2024 FINDINGS: Mediastinal blood pool activity: SUV max 1.7 NECK: No  hypermetabolic cervical lymph nodes are identified. No suspicious activity identified within the pharyngeal mucosal space. Incidental CT findings: Bilateral carotid atherosclerosis. CHEST: The dominant right perihilar upper lobe mass is hypermetabolic with an SUV max 6.6. This mass measures 3.5 x 2.9 cm on image 25/205. No hypermetabolic pulmonary activity or suspicious nodularity in the left lung. There are hypermetabolic mediastinal lymph nodes, including a subcarinal node measuring 1.9 cm short axis (SUV max 5.5). Incidental CT findings: Left subclavian pacemaker leads extend into the right atrium and right ventricle. Atherosclerosis of the aorta, great vessels and coronary arteries. Mild centrilobular emphysema present ABDOMEN/PELVIS: There is no hypermetabolic activity within the liver, adrenal glands, spleen or pancreas. There is no hypermetabolic nodal activity in the abdomen or pelvis. Previously demonstrated peritoneal nodularity anteriorly in the left upper quadrant is hypermetabolic. Largest component measures 2.2 x 1.4 cm on image 81/2 and has an SUV max of 6.2. No significant ascites. Incidental CT findings: Diffuse gaseous distension of the bowel again noted without evidence of obstruction or perforation. Aortic and branch vessel atherosclerosis post aortobifemoral bypass. Moderate prostatomegaly without focal hypermetabolic activity. SKELETON: As seen on prior imaging, there is widespread osseous metastatic disease with associated hypermetabolic activity. Representative lesions include a lytic lesion with a soft tissue component in the anterior aspect of the right 2nd rib, measuring 4.0 x 2.2 cm image 44/202 (SUV max 6.2), a lesion involving the posterior aspect the right 3rd rib, measuring 4.2 cm on image 36/202 (SUV max 8.0), and a large expansile destructive mass involving the left ischium, measuring 6.7 x 5.5 cm on image 144/202 (SUV max 8.4). There are multiple other lesions, including metastases  within the T9 and T11 vertebral bodies. The T11 lesion is associated with anterior epidural tumor which may place the patient at risk for cord compression. Appearance is similar to previous CT. Incidental CT findings: none IMPRESSION: 1. The dominant right upper lobe lung mass is hypermetabolic, consistent with primary bronchogenic carcinoma. 2. Hypermetabolic mediastinal adenopathy consistent with metastatic disease. 3. Widespread osseous metastatic disease with multiple lesions as described. The T11 lesion is associated with anterior epidural tumor which may place the patient at risk for cord compression. 4. Hypermetabolic peritoneal nodularity in the left upper quadrant consistent with metastatic disease. 5. Aortic Atherosclerosis (ICD10-I70.0) and Emphysema (ICD10-J43.9). Electronically Signed   By: Elsie Perone M.D.   On: 01/12/2024 10:46   ECHOCARDIOGRAM COMPLETE Result Date: 01/02/2024    ECHOCARDIOGRAM REPORT   Patient Name:   Peter Lambert Date of Exam: 01/02/2024 Medical Rec #:  [id number]       Height:       68.0 in Accession #:    [id number]      Weight:       146.0 lb Date of Birth:  1945/07/10      BSA:          1.788  m Patient Age:    78 years        BP:           114/67 mmHg Patient Gender: M               HR:           95 bpm. Exam Location:  Inpatient Procedure: 2D Echo, Cardiac Doppler and Color Doppler (Both Spectral and Color            Flow Doppler were utilized during procedure). Indications:    Chest Pain R07.9  History:        Patient has prior history of Echocardiogram examinations, most                 recent 08/30/2019. CHF, CAD, Pacemaker, COPD, Arrythmias:PVC and                 NSVT; Risk Factors:Hyperlipidemia, Diabetes and Hypertension.  Sonographer:    BERNARDA ROCKS Referring Phys: [id number] SARA-MAIZ A THOMAS IMPRESSIONS  1. Left ventricular ejection fraction, by estimation, is 60 to 65%. The left ventricle has normal function. Left ventricular endocardial border not optimally  defined to evaluate regional wall motion. Left ventricular diastolic parameters are indeterminate.  2. Right ventricular systolic function is normal. The right ventricular size is normal.  3. The mitral valve is abnormal. Mild mitral valve regurgitation.  4. The aortic valve was not well visualized. Aortic valve regurgitation is not visualized.  5. The inferior vena cava is dilated in size with <50% respiratory variability, suggesting right atrial pressure of 15 mmHg. Conclusion(s)/Recommendation(s): Technically very limited study due to poor sound wave transmission. Many structures not seen well. FINDINGS  Left Ventricle: Left ventricular ejection fraction, by estimation, is 60 to 65%. The left ventricle has normal function. Left ventricular endocardial border not optimally defined to evaluate regional wall motion. The left ventricular internal cavity size was normal in size. Suboptimal image quality limits for assessment of left ventricular hypertrophy. Left ventricular diastolic parameters are indeterminate. Right Ventricle: The right ventricular size is normal. No increase in right ventricular wall thickness. Right ventricular systolic function is normal. Left Atrium: Left atrial size was normal in size. Right Atrium: Right atrial size was normal in size. Pericardium: There is no evidence of pericardial effusion. Mitral Valve: The mitral valve is abnormal. There is mild thickening of the mitral valve leaflet(s). Mild mitral valve regurgitation. MV peak gradient, 2.9 mmHg. The mean mitral valve gradient is 1.0 mmHg. Tricuspid Valve: The tricuspid valve is not well visualized. Tricuspid valve regurgitation is not demonstrated. Aortic Valve: The aortic valve was not well visualized. Aortic valve regurgitation is not visualized. Aortic valve mean gradient measures 1.0 mmHg. Aortic valve peak gradient measures 1.6 mmHg. Aortic valve area, by VTI measures 4.13 cm. Pulmonic Valve: The pulmonic valve was not well  visualized. Pulmonic valve regurgitation is not visualized. Aorta: The aortic root is normal in size and structure. Venous: The inferior vena cava is dilated in size with less than 50% respiratory variability, suggesting right atrial pressure of 15 mmHg. IAS/Shunts: The interatrial septum was not well visualized. Additional Comments: A device lead is visualized.  LEFT VENTRICLE PLAX 2D LVIDd:         4.80 cm      Diastology LVIDs:         3.20 cm      LV e' medial:    5.98 cm/s LV PW:         0.90 cm  LV E/e' medial:  15.6 LV IVS:        1.00 cm      LV e' lateral:   13.20 cm/s LVOT diam:     2.10 cm      LV E/e' lateral: 7.1 LV SV:         40 LV SV Index:   22 LVOT Area:     3.46 cm  LV Volumes (MOD) LV vol d, MOD A2C: 99.7 ml LV vol d, MOD A4C: 159.0 ml LV vol s, MOD A2C: 37.6 ml LV vol s, MOD A4C: 54.4 ml LV SV MOD A2C:     62.1 ml LV SV MOD A4C:     159.0 ml LV SV MOD BP:      80.6 ml RIGHT VENTRICLE             IVC RV Basal diam:  3.50 cm     IVC diam: 2.10 cm RV S prime:     11.00 cm/s TAPSE (M-mode): 2.5 cm LEFT ATRIUM             Index        RIGHT ATRIUM           Index LA diam:        3.70 cm 2.07 cm/m   RA Area:     15.10 cm LA Vol (A2C):   26.9 ml 15.05 ml/m  RA Volume:   38.70 ml  21.65 ml/m LA Vol (A4C):   40.8 ml 22.82 ml/m LA Biplane Vol: 36.2 ml 20.25 ml/m  AORTIC VALVE                    PULMONIC VALVE AV Area (Vmax):    3.55 cm     PV Vmax:       0.68 m/s AV Area (Vmean):   3.30 cm     PV Peak grad:  1.8 mmHg AV Area (VTI):     4.13 cm AV Vmax:           64.10 cm/s AV Vmean:          41.400 cm/s AV VTI:            0.097 m AV Peak Grad:      1.6 mmHg AV Mean Grad:      1.0 mmHg LVOT Vmax:         65.70 cm/s LVOT Vmean:        39.500 cm/s LVOT VTI:          0.116 m LVOT/AV VTI ratio: 1.19  AORTA Ao Root diam: 3.40 cm Ao Asc diam:  3.50 cm MITRAL VALVE MV Area (PHT): 4.71 cm    SHUNTS MV Area VTI:   2.59 cm    Systemic VTI:  0.12 m MV Peak grad:  2.9 mmHg    Systemic Diam: 2.10 cm MV  Mean grad:  1.0 mmHg MV Vmax:       0.84 m/s MV Vmean:      43.0 cm/s MV Decel Time: 161 msec MV E velocity: 93.30 cm/s MV A velocity: 46.70 cm/s MV E/A ratio:  2.00 Toribio Fuel MD Electronically signed by Toribio Fuel MD Signature Date/Time: 01/02/2024/11:52:55 PM    Final    CT ABDOMEN PELVIS W CONTRAST Result Date: 01/01/2024 EXAM: CT ABDOMEN AND PELVIS WITH CONTRAST 01/01/2024 07:13:00 PM TECHNIQUE: CT of the abdomen and pelvis was performed with the administration of 50 mL of iohexol  (OMNIPAQUE ) 350 MG/ML injection. Multiplanar  reformatted images are provided for review. Automated exposure control, iterative reconstruction, and/or weight-based adjustment of the mA/kV was utilized to reduce the radiation dose to as low as reasonably achievable. COMPARISON: 12/22/2023 CLINICAL HISTORY: Bowel obstruction suspected. FINDINGS: LOWER CHEST: See chest CT report today. LIVER: The liver is unremarkable. GALLBLADDER AND BILE DUCTS: Gallbladder is unremarkable. No biliary ductal dilatation. SPLEEN: No acute abnormality. PANCREAS: No acute abnormality. ADRENAL GLANDS: No acute abnormality. KIDNEYS, URETERS AND BLADDER: No stones in the kidneys or ureters. No hydronephrosis. No perinephric or periureteral stranding. Urinary bladder is unremarkable. GI AND BOWEL: Gaseous distention of the bowel involves both large and small bowel; however, distal small bowel loops are decompressed. While I favor this most likely reflects ileus, it is difficult to completely exclude distal small bowel obstruction. Moderate stool burden throughout the colon. Normal appendix. PERITONEUM AND RETROPERITONEUM: No ascites. No free air. VASCULATURE: Aorta is normal in caliber. Changes of prior aortobifemoral bypass. LYMPH NODES: No lymphadenopathy. REPRODUCTIVE ORGANS: Prostate enlargement. 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 No focal soft tissue  abnormality. IMPRESSION: 1. Gaseous distention of bowel involving both large and small bowel with decompressed distal small bowel loops, most likely ileus; distal small bowel obstruction cannot be completely excluded. 2. 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 Electronically signed by: Franky Crease MD 01/01/2024 07:31 PM EST RP Workstation: [id number]   CT Angio Chest PE W and/or Wo Contrast Result Date: 01/01/2024 EXAM: CTA of the Chest without and with contrast for PE 01/01/2024 07:13:00 PM TECHNIQUE: CTA of the chest was performed without and with the administration of 50 mL of iohexol  (OMNIPAQUE ) 350 MG/ML injection. Multiplanar reformatted images are provided for review. MIP images are provided for review. Automated exposure control, iterative reconstruction, and/or weight based adjustment of the mA/kV was utilized to reduce the radiation dose to as low as reasonably achievable. COMPARISON: 12/22/2023 CLINICAL HISTORY: Pulmonary embolism (PE) suspected, high prob. FINDINGS: PULMONARY ARTERIES: Pulmonary arteries are adequately opacified for evaluation. No pulmonary embolism. Main pulmonary artery is normal in caliber. MEDIASTINUM: The heart and pericardium demonstrate no acute abnormality. There is no acute abnormality of the thoracic aorta. Right hilar and mediastinal adenopathy again noted, stable. LYMPH NODES: Right hilar and mediastinal adenopathy again noted, stable. No axillary lymphadenopathy. LUNGS AND PLEURA: Right upper lobe mass measures 3 x 3 cm on image 58. No focal consolidation or pulmonary edema. No pleural effusion or pneumothorax. UPPER ABDOMEN: Limited images of the upper abdomen are unremarkable. SOFT TISSUES AND BONES: Numerous lytic bony metastases throughout the ribs with soft tissue masses are unchanged. Stable lytic metastases throughout the thoracic spine. IMPRESSION: 1. No evidence of pulmonary embolism. 2. Stable right upper  lobe mass with unchanged right hilar and mediastinal adenopathy and diffuse lytic osseous metastases in the ribs and thoracic spine. Electronically signed by: Franky Crease MD 01/01/2024 07:26 PM EST RP Workstation: [id number]   DG Chest 2 View Result Date: 01/01/2024 EXAM: 2 VIEW(S) XRAY OF THE CHEST 01/01/2024 06:36:16 PM COMPARISON: None available. CLINICAL HISTORY: chest pain FINDINGS: LINES, TUBES AND DEVICES: Left chest pacemaker with leads overlying right atrium and right ventricle. LUNGS AND PLEURA: Right upper lung opacity. Right hilar asymmetric density. Ovoid pleural-based opacity at the right apex corresponding to rib lesion on CT. No pulmonary edema. No pleural effusion. No pneumothorax. HEART AND MEDIASTINUM: No acute abnormality of the cardiac an silhouettes. BONES AND SOFT TISSUES: Known right second and  third rib lesions. IMPRESSION: 1. Right upper lung and hilar opacity corresponding to the mass recently demonstrated on CT. 2. Right second and third rib lesions corresponding to metastatic lesions seen on CT Electronically signed by: Luke Bun MD 01/01/2024 06:56 PM EST RP Workstation: [id number]   CUP PACEART INCLINIC DEVICE CHECK Result Date: 01/01/2024 in-clinic _dual__ chamber ICD check. Presenting Rhythm: AF/VS_ . Routine testing was performed. Thresholds, sensing, and impedance demonstrate stable parameters and no programming changes needed. No treated arrhythmias. Estimated longevity __7.5 years__ . Pt enrolled in remote follow-up. AFib started 11/4, overall burden low, though increased of lat A lead impedance on a gradual downtrend though appears to have leveled off, and remains WNL no VT RU  CT CHEST ABDOMEN PELVIS W CONTRAST Result Date: 12/22/2023 EXAM: CT CHEST, ABDOMEN AND PELVIS WITH CONTRAST 12/22/2023 07:48:41 PM TECHNIQUE: CT of the chest, abdomen and pelvis was performed with the administration of 100 mL of iohexol  (OMNIPAQUE ) 300 MG/ML solution. Multiplanar reformatted  images are provided for review. Automated exposure control, iterative reconstruction, and/or weight based adjustment of the mA/kV was utilized to reduce the radiation dose to as low as reasonably achievable. COMPARISON: CT pelvis earlier today CLINICAL HISTORY: Metastatic disease evaluation. Metastatic disease eval, abnormal CT, c/o left hip pain that started 2-3 weeks ago. Pt reports pain radiates into his leg and worse with movement. Pt denies falling or injury. FINDINGS: CHEST: MEDIASTINUM AND LYMPH NODES: Heart and pericardium are unremarkable. Left subclavian icd Moderate coronary atherosclerosis of the lad Thoracic aortic atherosclerosis The central airways are clear. 13 mm short axis right supraclavicular node (image 3). Additional thoracic lymphadenopathy, including a 2.3 cm right hilar node (image 25), and a 1.7 cm short axis subcarinal node (image 29). LUNGS AND PLEURA: 3.0 x 4.0 cm posterior right upper lobe/perihilar mass (image 45) compatible with primary bronchogenic carcinoma. No pleural effusion or pneumothorax. ABDOMEN AND PELVIS: LIVER: The liver is unremarkable. GALLBLADDER AND BILE DUCTS: Gallbladder is unremarkable. No biliary ductal dilatation. SPLEEN: No acute abnormality. PANCREAS: No acute abnormality. ADRENAL GLANDS: No acute abnormality. KIDNEYS, URETERS AND BLADDER: No stones in the kidneys or ureters. No hydronephrosis. No perinephric or periureteral stranding. Urinary bladder is unremarkable. GI AND BOWEL: Stomach demonstrates no acute abnormality. There is no bowel obstruction. REPRODUCTIVE ORGANS: Mild prostatomegaly. PERITONEUM AND RETROPERITONEUM: 2.5 cm soft tissue lesion beneath the left upper anterior abdominal wall (image 58) suggesting peritoneal metastasis in this clinical context. No ascites. No free air. VASCULATURE: Atherosclerotic calcification of the abdominal aorta. Aortobifemoral bypass graft, patent. ABDOMINAL AND PELVIS LYMPH NODES: No lymphadenopathy. BONES AND SOFT  TISSUES: Lytic calcification metastases involving the anterior 2nd and posterior 3rd ribs (images 11 and 13) measuring up to 4.2 cm. Additional lytic soft tissue metastasis in the right posterior 8th rib. Lytic soft tissue metastasis involving the T9 and T11 vertebral bodies (sagittal image 102) with compression of the spinal canal at T11. Lytic soft tissue metastasis involving the left anterior acetabulum (image 113). Dominant 5.9 x 7.4 cm lytic soft tissue metastasis involving the left hemipelvis, extending from the posterior acetabulum to the inferior pubic ramus (image 123). Additional scattered smaller lytic metastases in the visualized axial and appendicular skeleton. A 2.5 cm soft tissue lesion is noted beneath the left upper anterior abdominal wall (image 58). IMPRESSION: 1. 4.0 cm posterior right upper lobe/perihilar mass, compatible with primary bronchogenic carcinoma. 2. Thoracic nodal metastases, as above. A 13 mm short axis right supraclavicular node would be amenable to percutaneous sampling, as clinically warranted.  3. Suspected isolated peritoneal metastasis in the left upper abdomen. 4. Extensive osseous metastatic disease, as described above, including a T11 lesion narrowing the spinal canal. Consider MR with contrast for further evaluation, as clinically warranted. Electronically signed by: Pinkie Pebbles MD 12/22/2023 08:06 PM EDT RP Workstation: [id number]   CT PELVIS WO CONTRAST Result Date: 12/22/2023 CLINICAL DATA:  Possible lucent lesions on x-ray. Left hip and left thigh pain. No known injury. EXAM: CT PELVIS WITHOUT CONTRAST TECHNIQUE: Multidetector CT imaging of the pelvis was performed following the standard protocol without intravenous contrast. RADIATION DOSE REDUCTION: This exam was performed according to the departmental dose-optimization program which includes automated exposure control, adjustment of the mA and/or kV according to patient size and/or use of iterative  reconstruction technique. COMPARISON:  12/22/2023. FINDINGS: Urinary Tract:  No abnormality visualized. Bowel: Unremarkable visualized pelvic bowel loops. Appendix appears normal. Vascular/Lymphatic: Findings suggestive of aortobifemoral bypass grafts. No pelvic lymphadenopathy by size criteria. Reproductive:  The prostate gland is enlarged. Other:  No ascites. Musculoskeletal: No acute fracture or dislocation is seen. Multiple lytic lesions are seen scattered throughout the bones. There are large destructive lesions with soft tissue masses is involving the sacrum on the right, acetabulum on the left, and inferior pubic ramus on the left. Evaluation of the masses is limited due to lack of IV contrast. However the largest soft tissue mass is seen in the region the inferior pubic ramus on the left measuring 5.8 x 5.5 cm. IMPRESSION: Multiple lytic lesions scattered throughout the bones with large destructive lesionsand associated soft tissue masses involving the sacrum on the right, acetabulum on the left, and inferior pubic ramus on the left. No pathologic fracture is seen. Differential diagnosis includes multiple myeloma/plasmacytoma versus metastatic disease. Electronically Signed   By: Leita Birmingham M.D.   On: 12/22/2023 18:31   DG Femur Min 2 Views Left Result Date: 12/22/2023 EXAM: 2 VIEW(S) XRAY OF THE LEFT FEMUR 12/22/2023 05:53:00 PM COMPARISON: None available. CLINICAL HISTORY: left hip/femur pain. Per triage ;  Pt arrives with c/o left hip pain that started 2-3 weeks ago. Pt reports pain radiates into his leg and worse with movement. Pt denies falling or injury. ; Best lateral obtainable, pt had trouble staying still once in position FINDINGS: BONES AND JOINTS: Left inferior pubic ramus appears fractured and fragmented with some areas of focal lucency. The left femur appears intact. There are mild degenerative changes of the hip and knee. No joint dislocation. SOFT TISSUES: Peripheral vascular  calcifications are present. IMPRESSION: 1. Fracture and fragmentation of the left inferior pubic ramus with focal lucency. 2. Mild degenerative changes of the hip and knee. Electronically signed by: Greig Pique MD 12/22/2023 06:10 PM EDT RP Workstation: [id number]   DG Pelvis 1-2 Views Result Date: 12/22/2023 EXAM: 1 or 2 VIEW(S) XRAY OF THE PELVIS 12/22/2023 04:57:18 PM COMPARISON: None available. CLINICAL HISTORY: Left hip pain. FINDINGS: BONES AND JOINTS: Questionable erosive changes and lucent lesions in the left inferior pubic ramus. No joint dislocation. SOFT TISSUES: Surgical clips overlie the bilateral inguinal regions. Peripheral vascular calcifications are present. STOMACH AND BOWEL: Diffuse gaseous distention of visualized colon. IMPRESSION: 1. Questionable erosive changes and lucent lesions in the left inferior pubic ramus. Recommend further evaluation with CT. 2. Diffuse gaseous distention of visualized colon. Electronically signed by: Greig Pique MD 12/22/2023 05:19 PM EDT RP Workstation: [id number]    Alm Schneider, DO  [facility]  If 7PM-7AM, please contact night-coverage [email or web address] Password [facility] 01/14/2024, 7:42 AM  LOS: 1 day

## 2024-01-14 NOTE — ED Notes (Signed)
 [facility] called to transport patient. Paramedic made aware. Spoke to [employer].

## 2024-01-14 NOTE — Progress Notes (Signed)
 Initial Nutrition Assessment  DOCUMENTATION CODES:   Not applicable  INTERVENTION:   Ensure Plus High Protein po TID, each supplement provides 350 kcal and 20 grams of protein Magic cup TID with meals, each supplement provides 290 kcal and 9 grams of protein MVI with minerals daily  NUTRITION DIAGNOSIS:   Increased nutrient needs related to cancer and cancer related treatments as evidenced by estimated needs.  GOAL:   Patient will meet greater than or equal to 90% of their needs  MONITOR:   PO intake, Supplement acceptance  REASON FOR ASSESSMENT:   Consult Assessment of nutrition requirement/status  ASSESSMENT:   78 yo male admitted with LE weakness, FTT. PMH includes recently diagnosed lung cancer with mets to spine, epidural tumor at T11, compression fracture at T9, HTN, CAD, PAD, tobacco abuse, HLD, DM-2, ICD, CHF.  Patient is currently in the ED awaiting transfer to [facility] for neurosurgery evaluation. Unable to speak with patient or complete NFPE at this time. From chart review, he has been unable to ambulate for several days PTA. He used a cane, then a walker up until a few days ago. He has no appetite.   Weight history reviewed.  07/18/22 75.9 kg 12/22/23 75.8 kg 01/13/24 64.4 kg Question accuracy of documented weights. If above measurements are correct, patient has lost 15% of usual weight within 3 weeks, which is unlikely. Suspect he has lost weight, but unsure of timeframe.   Labs reviewed.  Mag 1.6 CBG: 74-210  Medications reviewed and include novolog , synthroid, IV mag sulfate. IVF: NS at 100 ml/h  NUTRITION - FOCUSED PHYSICAL EXAM:  Unable to complete  Diet Order:   Diet Order             Diet Heart Room service appropriate? Yes; Fluid consistency: Thin  Diet effective now                   EDUCATION NEEDS:   No education needs have been identified at this time  Skin:  Skin Assessment: Reviewed RN Assessment  Last BM:  unknown  Height:    Ht Readings from Last 1 Encounters:  01/13/24 5' 8 (1.727 m)    Weight:   Wt Readings from Last 1 Encounters:  01/13/24 64.4 kg    Ideal Body Weight:  70 kg  BMI:  Body mass index is 21.59 kg/m.  Estimated Nutritional Needs:   Kcal:  1900-2100  Protein:  90-110 gm  Fluid:  1.9-2.1 L   Suzen HUNT RD, LDN, CNSC Contact via secure chat. If unavailable, use group chat RD Inpatient.

## 2024-01-15 ENCOUNTER — Ambulatory Visit
Admit: 2024-01-15 | Discharge: 2024-01-15 | Disposition: A | Discharge status: Home / Self Care | Attending: Radiation Oncology | Admitting: Radiation Oncology

## 2024-01-15 ENCOUNTER — Encounter: Payer: Self-pay | Admitting: Internal Medicine

## 2024-01-15 DIAGNOSIS — C7951 Secondary malignant neoplasm of bone: Secondary | ICD-10-CM

## 2024-01-15 DIAGNOSIS — R29898 Other symptoms and signs involving the musculoskeletal system: Secondary | ICD-10-CM | POA: Diagnosis not present

## 2024-01-15 LAB — GLUCOSE, CAPILLARY
Glucose-Capillary: 109 mg/dL — ABNORMAL HIGH (ref 70–99)
Glucose-Capillary: 99 mg/dL (ref 70–99)
Glucose-Capillary: 120 mg/dL — ABNORMAL HIGH (ref 70–99)
Glucose-Capillary: 100 mg/dL — ABNORMAL HIGH (ref 70–99)

## 2024-01-15 LAB — CBC
HCT: 28.4 % — ABNORMAL LOW (ref 39.0–52.0)
Hemoglobin: 9 g/dL — ABNORMAL LOW (ref 13.0–17.0)
MCH: 25.9 pg — ABNORMAL LOW (ref 26.0–34.0)
MCHC: 31.7 g/dL (ref 30.0–36.0)
MCV: 81.8 fL (ref 80.0–100.0)
Platelets: 276 K/uL (ref 150–400)
RBC: 3.47 MIL/uL — ABNORMAL LOW (ref 4.22–5.81)
RDW: 15.2 % (ref 11.5–15.5)
WBC: 15.2 K/uL — ABNORMAL HIGH (ref 4.0–10.5)
nRBC: 0 % (ref 0.0–0.2)

## 2024-01-15 LAB — COMPREHENSIVE METABOLIC PANEL WITH GFR
ALT: 24 U/L (ref 0–44)
AST: 22 U/L (ref 15–41)
Albumin: 2.3 g/dL — ABNORMAL LOW (ref 3.5–5.0)
Alkaline Phosphatase: 72 U/L (ref 38–126)
Anion gap: 10 (ref 5–15)
BUN: 29 mg/dL — ABNORMAL HIGH (ref 8–23)
CO2: 25 mmol/L (ref 22–32)
Calcium: 10.5 mg/dL — ABNORMAL HIGH (ref 8.9–10.3)
Chloride: 100 mmol/L (ref 98–111)
Creatinine, Ser: 1.22 mg/dL (ref 0.61–1.24)
GFR, Estimated: 60 mL/min — ABNORMAL LOW (ref >60)
Glucose, Bld: 134 mg/dL — ABNORMAL HIGH (ref 70–99)
Potassium: 4.6 mmol/L (ref 3.5–5.1)
Sodium: 135 mmol/L (ref 135–145)
Total Bilirubin: 0.6 mg/dL (ref 0.0–1.2)
Total Protein: 6 g/dL — ABNORMAL LOW (ref 6.5–8.1)

## 2024-01-15 LAB — MAGNESIUM: Magnesium: 1.7 mg/dL (ref 1.7–2.4)

## 2024-01-15 MED ORDER — ORAL CARE MOUTH RINSE: 15.0000 mL | OROMUCOSAL | Status: DC | PRN

## 2024-01-15 MED ORDER — APIXABAN 5 MG PO TABS
5.0000 mg | ORAL_TABLET | Freq: Two times a day (BID) | ORAL | Status: DC
  Administered 2024-01-15 – 2024-01-17 (×5): 5 mg via ORAL
  Filled 2024-01-15 (×5): qty 1

## 2024-01-15 MED ORDER — DEXAMETHASONE 4 MG PO TABS
4.0000 mg | ORAL_TABLET | Freq: Three times a day (TID) | ORAL | Status: DC
  Administered 2024-01-15 – 2024-01-19 (×12): 4 mg via ORAL
  Filled 2024-01-15 (×12): qty 1

## 2024-01-15 NOTE — Progress Notes (Signed)
 TO BE COMPLETED BY RADIATION ONCOLOGIST OFFICE:  Patient Name: Peter Lambert   Date of Birth: May 13, 1945   Radiation Oncologist: Lauraine Golden, MD   Site to be Treated: Spine   Will x-rays >10 MV be used? None   Will the radiation be >10 cm from the device? None   Planned Treatment Start Date: 01/16/2024   TO BE COMPLETED BY CARDIOLOGIST OFFICE:   Device Information:  Pacemaker []      ICD [x]    Brand: [employer]: [phone] Model #: [employer] D121 MOMENTUM EL ICD  Serial Number: [id number]     Date of Placement: 09/04/19  Site of Placement: chest  Remote Device Check--Frequency: 91 days   Last Check: 01/06/24  Is the Patient Pacer Dependent?:  Yes []   No [x]   Does cardiologist request Radiation Oncology to schedule device testing by vendor for the following:  Prior to the Initiation of Treatments?  Yes []  No [x]  During Treatments?  Yes []  No [x]  Post Radiation Treatments?  Yes []  No [x]   Is device monitoring necessary by vendor/cardiologist team during treatments?  Yes []   No [x]   Is cardiac monitoring by Radiation Oncology nursing necessary during treatments? Yes []   No [x]   Do you recommend device be relocated prior to Radiation Treatment? Yes []   No [x]   **PLEASE LIST ANY NOTES OR SPECIAL REQUESTS:       CARDIOLOGIST SIGNATURE:  Dr. Danelle Birmingham Per Device Clinic Standing Orders, [name]  01/15/2024 5:27 PM  **Please route completed form back to Radiation Oncology Nursing and P [facility] RAD ONC ADMIN, OR send an update if there will be a delay in having form completed by expected start date.  **Call [phone] if you have any questions or do not get an in-basket response from a Radiation Oncology staff member

## 2024-01-15 NOTE — TOC Progression Note (Signed)
 Transition of Care [facility]) - Progression Note    Patient Details  Name: Peter Lambert MRN: [id number] Date of Birth: Apr 16, 1945  [facility]) CM/SW Contact  Tom-Johnson, Harvest Muskrat, RN Phone Number: 01/15/2024, 11:32 AM  Clinical Narrative:     Patient was recently diagnosed with Lung Cancer, underwent Bronchoscopy on 01/07/24 and pathology showed Squamous Cell Lung Cancer. Oncology consulted, plan is to transfer to [facility] for Radiation treatment which is scheduled to start tomorrow 01/16/24.  CM will continue to follow as patient progresses with care towards discharge.        Expected Discharge Plan: Home w Home Health Services Barriers to Discharge: Continued Medical Work up               Expected Discharge Plan and Services In-house Referral: Clinical Social Work Discharge Planning Services: CM Consult   Living arrangements for the past 2 months: Single Family Home                                       Social Drivers of Health (SDOH) Interventions SDOH Screenings   Food Insecurity: Patient Declined (01/02/2024)  Housing: Patient Declined (01/02/2024)  Transportation Needs: Patient Declined (01/02/2024)  Utilities: Patient Declined (01/02/2024)  Depression (PHQ2-9): Low Risk  (12/27/2023)  Social Connections: Patient Declined (01/02/2024)  Tobacco Use: Medium Risk (01/13/2024)    Readmission Risk Interventions    01/14/2024    9:18 AM  Readmission Risk Prevention Plan  Transportation Screening Complete  HRI or Home Care Consult Complete  Social Work Consult for Recovery Care Planning/Counseling Complete  Palliative Care Screening Not Applicable  Medication Review Oceanographer) Complete

## 2024-01-15 NOTE — Progress Notes (Signed)
 PROGRESS NOTE  Peter Lambert FMW:[id number] DOB: 1945-09-11 DOA: 01/13/2024 PCP: Peter Rush, MD   LOS: 2 days   Brief Narrative / Interim history: 78 year old male with history of tobacco use, HTN, HLD, DM2, persistent A-fib on Eliquis , CAD with prior stenting in 2010, V-fib arrest in 2021 due to occluded RCA status post ICD, PAD with history of aortofemoral bypass grafting who came into the [facility] by family due to functional decline over the last couple of weeks, worsening back pain and poor ambulation.  Previously he was using a cane and sometimes a walker, but has been unable to ambulate in the last few days with left lower extremity weakness more than the right, and has had been having worsening back pain.  He was recently diagnosed with lung cancer, underwent a bronchoscopy 11/11 and pathology showed squamous cell lung cancer  Subjective / 24h Interval events: Complains of low back pain, just worked with physical therapy and was able to stand a little bit but did not do much walking.  Denies any chest pain, denies any shortness of breath  Assesement and Plan: Principal problem Lung cancer metastasized to the bone and spine -just underwent bronchoscopy 01/07/2024 for lung mass, biopsy showed squamous cell carcinoma.  He is getting established with [facility] in [place] [facility].  Patient was admitted to [facility] due to worsening low back pain and lower extremity weakness.  Dr. Colon evaluated the patient on 11/18 recommending conservative treatment, no surgery, and radiation oncology consultation.  Discussed with Dr. Izell this morning, will transfer patient to [facility] and radiation will start tomorrow - PT currently recommends 24/7 care, patient lives by himself.  Discussed with the patient's son over the phone, he is not sure whether they will be able to provide daily transportation to his radiation treatment, but he will ask the extended family and see if they can  arrange that once he is discharged home  Active problems Hypercalcemia of malignancy - Presented with corrected calcium  12.3.  S/p Zometa, IV fluids.  Appears asymptomatic from this  Hypomagnesemia - Replete and continue to monitor  Persistent atrial fibrillation -apixaban  was on hold pending neurosurgical evaluation, resume today since no operative interventions are planned  Essential hypertension - Continue carvedilol , holding losartan  since he is normotensive  COPD - Stable on room air  Coronary disease - No chest pain presently  Diabetes mellitus type 2 -continue sliding scale  Lab Results  Component Value Date   HGBA1C 6.8 (H) 01/14/2024   CBG (last 3)  Recent Labs    01/14/24 1839 01/14/24 2349 01/15/24 0749  GLUCAP 143* 150* 109*   Chronic HFpEF - Clinically euvolemic, 01/02/2024 echo EF 60 to 65%, no WMA, normal RVF. Continue carvedilol    Mixed hyperlipidemia - Continue statin   Hypothyroidism -continue levothyroxine   Tobacco abuse - Tobacco cessation discussed  Scheduled Meds:  apixaban   5 mg Oral BID   atorvastatin   80 mg Oral Daily   carvedilol   3.125 mg Oral Q breakfast   feeding supplement  237 mL Oral TID BM   insulin  aspart  0-9 Units Subcutaneous TID WC   levothyroxine  50 mcg Oral QAC breakfast   multivitamin with minerals  1 tablet Oral Daily   Continuous Infusions: PRN Meds:.acetaminophen  **OR** acetaminophen , morphine injection, prochlorperazine  Current Outpatient Medications  Medication Instructions   apixaban  (ELIQUIS ) 5 mg, Oral, 2 times daily, Hold Eliquis  on 01/05/2024 for bronchoscopy   atorvastatin  (LIPITOR ) 80 mg, Daily   carvedilol  (  COREG ) 3.125 mg, Oral, Daily with breakfast   [employer] GENTLE LAXATIVE 5 MG EC tablet TAKE 1 TABLET BY MOUTH DAILY AS NEEDED FOR MODERATE CONSTIPATION. TAKE TWO DAYS BEFORE PROCEDURE   fentaNYL  (DURAGESIC ) 25 MCG/HR 1 patch, Transdermal, every 72 hours   ferrous sulfate  (FERROUSUL) 325 mg, Oral, Daily with  breakfast   furosemide  (LASIX ) 20 mg, Daily   levothyroxine (SYNTHROID) 50 mcg, Daily before breakfast   losartan  (COZAAR ) 25 mg, Daily   nitroGLYCERIN  (NITROSTAT ) 0.4 mg, Sublingual, Every 5 min PRN, Patient must keep upcoming appointment for further refills   omeprazole  (PRILOSEC) 20 mg, Daily after lunch   potassium chloride  SA (KLOR-CON ) 20 MEQ tablet 20 mEq, Daily after lunch   spironolactone  (ALDACTONE ) 50 mg, Daily   traMADol  (ULTRAM ) 50 MG tablet Take 1 tablet every 6-8 hours as needed for pain    Diet Orders (From admission, onward)     Start     Ordered   01/14/24 0147  Diet Heart Room service appropriate? Yes; Fluid consistency: Thin  Diet effective now       Question Answer Comment  Room service appropriate? Yes   Fluid consistency: Thin      01/14/24 0147            DVT prophylaxis: SCDs Start: 01/14/24 0146 apixaban  (ELIQUIS ) tablet 5 mg   Lab Results  Component Value Date   PLT 276 01/15/2024      Code Status: Full Code  Family Communication: Discussed with son Peter [facility], Peter [facility], over the phone  Status is: Inpatient Remains inpatient appropriate because: Severity of illness  Level of care: Med-Surg  Consultants:  Neurosurgery Radiation oncology  Objective: Vitals:   01/14/24 2051 01/14/24 2351 01/15/24 0428 01/15/24 0740  BP: (!) 102/51 (!) 111/58 113/62 (!) 111/59  Pulse: 77 73 60 60  Resp: 18 18 18 18   Temp: 98 F (36.7 C) 97.6 F (36.4 C) 97.9 F (36.6 C) (!) 96 F (35.6 C)  TempSrc: Oral Oral Oral   SpO2: 96% 100% 97% 92%  Weight:      Height:        Intake/Output Summary (Last 24 hours) at 01/15/2024 0956 Last data filed at 01/15/2024 0800 Gross per 24 hour  Intake 1927.79 ml  Output 600 ml  Net 1327.79 ml   Wt Readings from Last 3 Encounters:  01/13/24 64.4 kg  01/07/24 65.3 kg  01/02/24 63.5 kg    Examination:  Constitutional: NAD Eyes: no scleral icterus ENMT: Mucous membranes are moist.  Neck: normal,  supple Respiratory: clear to auscultation bilaterally, no wheezing, no crackles.  Cardiovascular: Regular rate and rhythm, no murmurs / rubs / gallops.  Abdomen: non distended, no tenderness. Bowel sounds positive.  Musculoskeletal: no clubbing / cyanosis.   Data Reviewed: I have independently reviewed following labs and imaging studies   CBC Recent Labs  Lab 01/13/24 1505 01/14/24 0232 01/15/24 0405  WBC 14.6* 11.5* 15.2*  HGB 10.6* 9.9* 9.0*  HCT 33.7* 32.3* 28.4*  PLT 365 281 276  MCV 84.0 85.0 81.8  MCH 26.4 26.1 25.9*  MCHC 31.5 30.7 31.7  RDW 15.1 15.0 15.2    Recent Labs  Lab 01/13/24 1505 01/14/24 0232 01/15/24 0405  NA 139 138 135  K 4.0 4.2 4.6  CL 98 98 100  CO2 30 30 25   GLUCOSE 133* 119* 134*  BUN 20 21 29*  CREATININE 1.15 1.17 1.22  CALCIUM  11.9* 11.6* 10.5*  AST 26 25 22   ALT 23 19  24  ALKPHOS 111 99 72  BILITOT 0.5 0.5 0.6  ALBUMIN 3.5 3.3* 2.3*  MG  --  1.6* 1.7  HGBA1C  --  6.8*  --     ------------------------------------------------------------------------------------------------------------------ No results for input(s): CHOL, HDL, LDLCALC, TRIG, CHOLHDL, LDLDIRECT in the last 72 hours.  Lab Results  Component Value Date   HGBA1C 6.8 (H) 01/14/2024   ------------------------------------------------------------------------------------------------------------------ No results for input(s): TSH, T4TOTAL, T3FREE, THYROIDAB in the last 72 hours.  Invalid input(s): FREET3  Cardiac Enzymes No results for input(s): CKMB, TROPONINI, MYOGLOBIN in the last 168 hours.  Invalid input(s): CK ------------------------------------------------------------------------------------------------------------------    Component Value Date/Time   BNP 224.1 (H) 01/01/2024 1705    CBG: Recent Labs  Lab 01/14/24 0956 01/14/24 1432 01/14/24 1839 01/14/24 2349 01/15/24 0749  GLUCAP 210* 169* 143* 150* 109*    No  results found for this or any previous visit (from the past 240 hours).   Radiology Studies: No results found.   Nilda Fendt, MD, PhD [facility]  Between 7 am - 7 pm I am available, please contact me via Amion (for emergencies) or Securechat (non urgent messages)  Between 7 pm - 7 am I am not available, please contact night coverage MD/APP via Amion

## 2024-01-15 NOTE — Progress Notes (Signed)
 Pt is being transferred to [facility] at this time. Vitals are stable.   Peter [facility] Peter [facility]

## 2024-01-15 NOTE — Progress Notes (Signed)
 Radiation Oncology         [phone] ________________________________  Name: Peter Lambert        MRN: [id number]  Date of Service: 01/15/2024 DOB: 02/20/1946  RR:Hnoipwh, Norleen, MD  Trixie Nilda HERO, MD     REFERRING PHYSICIAN: Trixie Nilda HERO, MD  INPATIENT CONSULTATION  DIAGNOSIS: The encounter diagnosis was Metastatic cancer to bone [facility]). C79.51  Osseous metastases from this stage IV non-small cell lung cancer of the right upper lung.  HISTORY OF PRESENT ILLNESS: Peter Lambert is a 78 y.o. male seen at the request of Dr. Trixie for bony metastases.   Patient initially presented to the emergency room for hip pain and was found to have a lytic lesion in the acetabulum.  CT of the chest abdomen and pelvis demonstrated a right lung mass with hilar and mediastinal adenopathy and diffuse lytic osseous metastases, concerning for metastatic lung primary.  He was seen by Delon Hope, NP on 12/27/2023 who recommended further staging workup.    PET on 01/09/2024 demonstrated the right upper lobe mass to be hypermetabolic; hypermetabolic mediastinal adenopathy; widespread osseous metastatic disease with a T11 lesion with associated anterior epidermal tumor; and hypermetabolic peritoneal nodularity in the left upper quadrant. Patient underwent bronchoscopy and biopsy on 01/09/2024.  Surgical pathology revealed non-small cell carcinoma in the right upper lobe.  Lymph nodes 4R, 7, 11R, were all sampled and showed non-small cell carcinoma.  The FNA of lymph node 7 showed findings consistent with squamous cell carcinoma.    He was scheduled to return to Dr. Davonna to proceed with the treatment plan however he presented to the emergency room on 01/13/2024 with bilateral foot, hip, and back pain.  MRI of the thoracic/lumbar spine obtained that day showed widespread osseous metastases, pathologic compression fractures with 10% height loss at T9 and 45% height loss at T11; destructive 4 cm S1  lesion; no evidence of epidural tumor or spinal stenosis in the lumbar spine.  Patient was admitted to [facility] for further workup.  Dr. Colon evaluated the patient on 01/14/2024 and recommended conservative treatment with no surgery.  Today, the patient notes an approximate 4 month history of left hip/going/thigh and lower back pain that radiates across his bilateral back. He reports this pain as 8/10 and is worse with position changes. He feels like his pain is well controlled on medication since being in the hospital. He also notes a 4-week history of bilateral big toe and calf pain with associated numbness and tingling of the feet and weakness in his legs. He denies any incontinence or numbness, tingling, or pain in the upper extremities. He denies any chest or side pain.    PREVIOUS RADIATION THERAPY: No   PAST MEDICAL HISTORY:  Past Medical History:  Diagnosis Date   Acute upper GI bleed    Anemia    Anticoagulated on apixaban     Cardiac arrest with ventricular fibrillation (HCC) 2021   CHF (congestive heart failure) (HCC)    Chronic a-fib (HCC)    Coronary artery disease    a. s/p NSTEMI in 2010 with DES to proximal LAD with D1 jailed and angioplasty alone to ostium   Dyspnea    GERD (gastroesophageal reflux disease)    Hyperlipidemia    Hypertension    Mediastinal lymphadenopathy    NSTEMI (non-ST elevated myocardial infarction) (HCC) 2010   Peripheral arterial disease    S/P coronary artery stent placement    S/P ICD (internal cardiac defibrillator) procedure  Tobacco abuse    Type 2 diabetes mellitus (HCC)        PAST SURGICAL HISTORY: Past Surgical History:  Procedure Laterality Date   CARDIAC SURGERY     COLONOSCOPY WITH PROPOFOL  N/A 01/08/2020   Procedure: COLONOSCOPY WITH PROPOFOL ;  Surgeon: Cindie Carlin POUR, DO;  Location: [facility];  Service: Endoscopy;  Laterality: N/A;   COLONOSCOPY WITH PROPOFOL  N/A 05/03/2020   Hung:entire examined colon normal    CORONARY STENT INTERVENTION N/A 09/03/2019   Procedure: CORONARY STENT INTERVENTION;  Surgeon: Verlin Lonni BIRCH, MD;  Location: [facility] INVASIVE CV LAB;  Service: Cardiovascular;  Laterality: N/A;   CORONARY STENT PLACEMENT     ENDOBRONCHIAL ULTRASOUND Bilateral 01/07/2024   Procedure: ENDOBRONCHIAL ULTRASOUND (EBUS);  Surgeon: Malka Domino, MD;  Location: [facility] ORS;  Service: Pulmonary;  Laterality: Bilateral;   ENTEROSCOPY N/A 05/03/2020   hung:normal esophagus, normal stomach. Three non-bleeding angiodysplastic lesions in the duodenum. Treated with a monopolar probe. A few non-bleeding angiodysplastic lesions in the jejunum. Treated with a monopolar probe. No specimens collected.   ESOPHAGOGASTRODUODENOSCOPY (EGD) WITH PROPOFOL  N/A 01/06/2020   Procedure: ESOPHAGOGASTRODUODENOSCOPY (EGD) WITH PROPOFOL ;  Surgeon: Golda Claudis PENNER, MD;  Location: [facility];  Service: Endoscopy;  Laterality: N/A;   HOT HEMOSTASIS N/A 05/03/2020   Procedure: HOT HEMOSTASIS (ARGON PLASMA COAGULATION/BICAP);  Surgeon: Rollin Dover, MD;  Location: [facility] ENDOSCOPY;  Service: Gastroenterology;  Laterality: N/A;   ICD IMPLANT N/A 09/04/2019   Procedure: ICD IMPLANT;  Surgeon: Waddell Danelle ORN, MD;  Location: [facility] INVASIVE CV LAB;  Service: Cardiovascular;  Laterality: N/A;   RIGHT/LEFT HEART CATH AND CORONARY ANGIOGRAPHY N/A 09/03/2019   Procedure: RIGHT/LEFT HEART CATH AND CORONARY ANGIOGRAPHY;  Surgeon: Cherrie Toribio SAUNDERS, MD;  Location: [facility] INVASIVE CV LAB;  Service: Cardiovascular;  Laterality: N/A;     FAMILY HISTORY:  Family History  Problem Relation Age of Onset   Colon cancer Mother        thinks it was colon cancer   Cancer Mother        not sure location    Prostate cancer Father    Dementia Sister    Colon cancer Brother    Stomach cancer Neg Hx      SOCIAL HISTORY:  reports that he has quit smoking. His smoking use included cigarettes. He has a 26 pack-year smoking history. He has  never used smokeless tobacco. He reports that he does not drink alcohol and does not use drugs.   ALLERGIES: Patient has no known allergies.   MEDICATIONS:  No current facility-administered medications for this encounter.   No current outpatient medications on file.   Facility-Administered Medications Ordered in Other Encounters  Medication Dose Route Frequency Provider Last Rate Last Admin   acetaminophen  (TYLENOL ) tablet 650 mg  650 mg Oral Q6H PRN Adefeso, Oladapo, DO       Or   acetaminophen  (TYLENOL ) suppository 650 mg  650 mg Rectal Q6H PRN Adefeso, Oladapo, DO       apixaban  (ELIQUIS ) tablet 5 mg  5 mg Oral BID Gherghe, Costin M, MD   5 mg at 01/15/24 1048   atorvastatin  (LIPITOR ) tablet 80 mg  80 mg Oral Daily Adefeso, Oladapo, DO   80 mg at 01/15/24 1048   carvedilol  (COREG ) tablet 3.125 mg  3.125 mg Oral Q breakfast Adefeso, Oladapo, DO   3.125 mg at 01/15/24 1048   dexamethasone  (DECADRON ) tablet 4 mg  4 mg Oral Q8H Gherghe, Costin M, MD   4 mg  at 01/15/24 1354   feeding supplement (ENSURE PLUS HIGH PROTEIN) liquid 237 mL  237 mL Oral TID BM Tat, Alm, MD   237 mL at 01/15/24 1354   insulin  aspart (novoLOG ) injection 0-9 Units  0-9 Units Subcutaneous TID WC Tat, Alm, MD   1 Units at 01/14/24 1852   levothyroxine (SYNTHROID) tablet 50 mcg  50 mcg Oral QAC breakfast Adefeso, Oladapo, DO   50 mcg at 01/15/24 0542   morphine (PF) 2 MG/ML injection 2 mg  2 mg Intravenous Q4H PRN Adefeso, Oladapo, DO   2 mg at 01/15/24 1049   multivitamin with minerals tablet 1 tablet  1 tablet Oral Daily Tat, David, MD   1 tablet at 01/15/24 1048   Oral care mouth rinse  15 mL Mouth Rinse PRN Gherghe, Costin M, MD       prochlorperazine (COMPAZINE) injection 10 mg  10 mg Intravenous Q6H PRN Adefeso, Oladapo, DO         REVIEW OF SYSTEMS: Notable for that above.      PHYSICAL EXAM:  Wt Readings from Last 3 Encounters:  01/13/24 142 lb (64.4 kg)  01/07/24 144 lb (65.3 kg)  01/02/24 139 lb  14.4 oz (63.5 kg)   Temp Readings from Last 3 Encounters:  01/15/24 98 F (36.7 C)  01/07/24 (!) 97.2 F (36.2 C)  01/03/24 (!) 97.2 F (36.2 C) (Oral)   BP Readings from Last 3 Encounters:  01/15/24 (!) 119/59  01/07/24 113/70  01/03/24 (!) 109/53   Pulse Readings from Last 3 Encounters:  01/15/24 70  01/07/24 75  01/03/24 (!) 137    /10  In general this is a frail appearing male in no acute distress. He's alert and oriented x4 and appropriate throughout the examination. Cardiopulmonary assessment is negative for acute distress and he exhibits normal effort.   MSK: Dorsiflexion and plantar flexion intact. 5/5 strength in bilateral lower and upper extremities. No tenderness to palpation along the left hip. He is unable to sit up in bed due to pain.  NEURO: Sensation grossly intact.   ECOG = 4  0 - Asymptomatic (Fully active, able to carry on all predisease activities without restriction)  1 - Symptomatic but completely ambulatory (Restricted in physically strenuous activity but ambulatory and able to carry out work of a light or sedentary nature. For example, light housework, office work)  2 - Symptomatic, <50% in bed during the day (Ambulatory and capable of all self care but unable to carry out any work activities. Up and about more than 50% of waking hours)  3 - Symptomatic, >50% in bed, but not bedbound (Capable of only limited self-care, confined to bed or chair 50% or more of waking hours)  4 - Bedbound (Completely disabled. Cannot carry on any self-care. Totally confined to bed or chair)  5 - Death   Raylene MM, Creech RH, Tormey DC, et al. [phone]). Toxicity and response criteria of the [facility]. Am. DOROTHA Bridges. Oncol. 5 (6): 649-55    LABORATORY DATA:  Lab Results  Component Value Date   WBC 15.2 (H) 01/15/2024   HGB 9.0 (L) 01/15/2024   HCT 28.4 (L) 01/15/2024   MCV 81.8 01/15/2024   PLT 276 01/15/2024   Lab Results  Component Value  Date   NA 135 01/15/2024   K 4.6 01/15/2024   CL 100 01/15/2024   CO2 25 01/15/2024   Lab Results  Component Value Date   ALT 24 01/15/2024  AST 22 01/15/2024   ALKPHOS 72 01/15/2024   BILITOT 0.6 01/15/2024      RADIOGRAPHY: MR Lumbar Spine W Wo Contrast Result Date: 01/13/2024 EXAM: MRI LUMBAR SPINE 01/13/2024 06:58:11 PM TECHNIQUE: Multiplanar multisequence MRI of the lumbar spine was performed with and without the administration of intravenous contrast. 6 mL of gadobutrol (GADAVIST) 1 MMOL/ML injection was administered. COMPARISON: PET CT 01/09/2024. CT abdomen and pelvis 01/01/2024. CLINICAL HISTORY: Myelopathy, acute, lumbar spine. FINDINGS: BONES AND ALIGNMENT: 5 lumbar type vertebrae. Normal alignment. Multiple T1 hypointense, enhancing lesions consistent with previously shown metastases. Numerous small lesions are scattered throughout the vertebral bodies and posterior elements of the lumbar spine as well as a larger, 2 cm lesion in the L1 vertebral body. Preserved lumbar vertebral body heights. 4 cm destructive lesion involving the anterior sacrum at S1 on the right with extraosseous tumor potentially affecting the extraforaminal right L5 nerve. Bilateral iliac lesions. Mild multilevel neural foraminal stenosis. SPINAL CORD: The conus medullaris terminates at L1-L2 and is normal in signal. No evidence of epidural tumor in the lumbar spine. SOFT TISSUES: Status post aortobifemoral bypass. DISC LEVELS: Mild diffuse lumbar spondylosis and moderate facet hypertrophy without significant spinal stenosis. Mild multilevel neural foraminal stenosis. IMPRESSION: 1. Known widespread osseous metastases including a destructive 4 cm S1 lesion. 2. No evidence of epidural tumor or spinal stenosis in the lumbar spine. Electronically signed by: Dasie Hamburg MD 01/13/2024 08:00 PM EST RP Workstation: [id number]   MR THORACIC SPINE W WO CONTRAST Result Date: 01/13/2024 EXAM: MRI THORACIC SPINE WITH AND  WITHOUT INTRAVENOUS CONTRAST 01/13/2024 06:58:11 PM TECHNIQUE: Multiplanar multisequence MRI of the thoracic spine was performed with and without the administration of intravenous contrast. COMPARISON: PET CT 01/09/2024. CTA chest and CT abdomen and pelvis 01/01/2024. CLINICAL HISTORY: Mid-back pain, neuro deficit; known T11 mass. FINDINGS: BONES AND ALIGNMENT: Normal alignment. Multiple T1 hypointense, enhancing lesions consistent with previously shown metastases, including involvement of the T6 and T9 to T11 vertebral bodies as well as destructive lesions of the posterior right 3rd and 8th ribs. The T9 lesion extends into the right pedicle, and the T11 lesion involves the primarily left sided posterior elements. Pathologic compression fractures with 10% vertebral body height loss at T9 and 45% central vertebral body height loss at T11. Right sided ventral and lateral epidural tumor at T9 results in mild spinal stenosis and encroaches upon the medial aspect of the right T9-T10 neural foramen. There is a larger amount of ventral and lateral epidural tumor bilaterally at T11 which extends superiorly to T10 and results in moderate spinal stenosis without cord compression. Tumor also encroaches upon the left sided neural foramina at T11-T12 greater than T10-T11. SPINAL CORD: Normal spinal cord signal. SOFT TISSUES: Known right upper lobe lung mass. DEGENERATIVE CHANGES: Mild thoracic spondylosis without significant degenerative spinal stenosis. LUNGS: IMPRESSION: 1. Known widespread osseous metastases. 2. Pathologic compression fractures with 10% height loss at T9 and 45% height loss at T11. 3. Epidural tumor at T11 resulting in moderate spinal stenosis. 4. Smaller volume epidural tumor at T9 resulting in mild spinal stenosis. Electronically signed by: Dasie Hamburg MD 01/13/2024 07:50 PM EST RP Workstation: [id number]   DG Chest 1 View Result Date: 01/13/2024 CLINICAL DATA:  Weakness, immobility, recent falls, pain  to toes with possible infection. EXAM: DG CHEST 1V COMPARISON:  January 01, 2024 FINDINGS: There is stable dual lead AICD positioning. The heart size and mediastinal contours are within normal limits. Mild right upper lobe scarring and/or atelectasis is seen.  Adjacent ill-defined right upper lobe opacities are noted which correspond to right upper lobe suprahilar and pleural based mass is seen on recent chest CT (January 01, 2024). No pleural effusion or pneumothorax is identified. Cortical destruction of the anterior aspect of the second right rib is seen. IMPRESSION: 1. Mild right upper lobe scarring and/or atelectasis. 2. Right upper lobe suprahilar and pleural based mass lesions, as described above. 3. Cortical destruction of the anterior aspect of the second right rib. Electronically Signed   By: Suzen Dials M.D.   On: 01/13/2024 16:22   NM PET Image Initial (PI) Skull Base To Thigh Result Date: 01/12/2024 CLINICAL DATA:  Initial treatment strategy for right upper lobe lung mass with evidence of metastatic disease prior CT. EXAM: NUCLEAR MEDICINE PET SKULL BASE TO THIGH TECHNIQUE: 7.32 mCi F-18 FDG was injected intravenously. Full-ring PET imaging was performed from the skull base to thigh after the radiotracer. CT data was obtained and used for attenuation correction and anatomic localization. Fasting blood glucose: 107 mg/dl COMPARISON:  CT of the chest, abdomen and pelvis 12/22/2023 and 01/01/2024 FINDINGS: Mediastinal blood pool activity: SUV max 1.7 NECK: No hypermetabolic cervical lymph nodes are identified. No suspicious activity identified within the pharyngeal mucosal space. Incidental CT findings: Bilateral carotid atherosclerosis. CHEST: The dominant right perihilar upper lobe mass is hypermetabolic with an SUV max 6.6. This mass measures 3.5 x 2.9 cm on image 25/205. No hypermetabolic pulmonary activity or suspicious nodularity in the left lung. There are hypermetabolic mediastinal lymph  nodes, including a subcarinal node measuring 1.9 cm short axis (SUV max 5.5). Incidental CT findings: Left subclavian pacemaker leads extend into the right atrium and right ventricle. Atherosclerosis of the aorta, great vessels and coronary arteries. Mild centrilobular emphysema present ABDOMEN/PELVIS: There is no hypermetabolic activity within the liver, adrenal glands, spleen or pancreas. There is no hypermetabolic nodal activity in the abdomen or pelvis. Previously demonstrated peritoneal nodularity anteriorly in the left upper quadrant is hypermetabolic. Largest component measures 2.2 x 1.4 cm on image 81/2 and has an SUV max of 6.2. No significant ascites. Incidental CT findings: Diffuse gaseous distension of the bowel again noted without evidence of obstruction or perforation. Aortic and branch vessel atherosclerosis post aortobifemoral bypass. Moderate prostatomegaly without focal hypermetabolic activity. SKELETON: As seen on prior imaging, there is widespread osseous metastatic disease with associated hypermetabolic activity. Representative lesions include a lytic lesion with a soft tissue component in the anterior aspect of the right 2nd rib, measuring 4.0 x 2.2 cm image 44/202 (SUV max 6.2), a lesion involving the posterior aspect the right 3rd rib, measuring 4.2 cm on image 36/202 (SUV max 8.0), and a large expansile destructive mass involving the left ischium, measuring 6.7 x 5.5 cm on image 144/202 (SUV max 8.4). There are multiple other lesions, including metastases within the T9 and T11 vertebral bodies. The T11 lesion is associated with anterior epidural tumor which may place the patient at risk for cord compression. Appearance is similar to previous CT. Incidental CT findings: none IMPRESSION: 1. The dominant right upper lobe lung mass is hypermetabolic, consistent with primary bronchogenic carcinoma. 2. Hypermetabolic mediastinal adenopathy consistent with metastatic disease. 3. Widespread osseous  metastatic disease with multiple lesions as described. The T11 lesion is associated with anterior epidural tumor which may place the patient at risk for cord compression. 4. Hypermetabolic peritoneal nodularity in the left upper quadrant consistent with metastatic disease. 5. Aortic Atherosclerosis (ICD10-I70.0) and Emphysema (ICD10-J43.9). Electronically Signed   By: Elsie  Gertrude M.D.   On: 01/12/2024 10:46   ECHOCARDIOGRAM COMPLETE Result Date: 01/02/2024    ECHOCARDIOGRAM REPORT   Patient Name:   Peter Lambert Date of Exam: 01/02/2024 Medical Rec #:  [id number]       Height:       68.0 in Accession #:    [id number]      Weight:       146.0 lb Date of Birth:  31-Aug-1945      BSA:          1.788 m Patient Age:    78 years        BP:           114/67 mmHg Patient Gender: M               HR:           95 bpm. Exam Location:  Inpatient Procedure: 2D Echo, Cardiac Doppler and Color Doppler (Both Spectral and Color            Flow Doppler were utilized during procedure). Indications:    Chest Pain R07.9  History:        Patient has prior history of Echocardiogram examinations, most                 recent 08/30/2019. CHF, CAD, Pacemaker, COPD, Arrythmias:PVC and                 NSVT; Risk Factors:Hyperlipidemia, Diabetes and Hypertension.  Sonographer:    BERNARDA ROCKS Referring Phys: [id number] SARA-MAIZ A THOMAS IMPRESSIONS  1. Left ventricular ejection fraction, by estimation, is 60 to 65%. The left ventricle has normal function. Left ventricular endocardial border not optimally defined to evaluate regional wall motion. Left ventricular diastolic parameters are indeterminate.  2. Right ventricular systolic function is normal. The right ventricular size is normal.  3. The mitral valve is abnormal. Mild mitral valve regurgitation.  4. The aortic valve was not well visualized. Aortic valve regurgitation is not visualized.  5. The inferior vena cava is dilated in size with <50% respiratory variability, suggesting right  atrial pressure of 15 mmHg. Conclusion(s)/Recommendation(s): Technically very limited study due to poor sound wave transmission. Many structures not seen well. FINDINGS  Left Ventricle: Left ventricular ejection fraction, by estimation, is 60 to 65%. The left ventricle has normal function. Left ventricular endocardial border not optimally defined to evaluate regional wall motion. The left ventricular internal cavity size was normal in size. Suboptimal image quality limits for assessment of left ventricular hypertrophy. Left ventricular diastolic parameters are indeterminate. Right Ventricle: The right ventricular size is normal. No increase in right ventricular wall thickness. Right ventricular systolic function is normal. Left Atrium: Left atrial size was normal in size. Right Atrium: Right atrial size was normal in size. Pericardium: There is no evidence of pericardial effusion. Mitral Valve: The mitral valve is abnormal. There is mild thickening of the mitral valve leaflet(s). Mild mitral valve regurgitation. MV peak gradient, 2.9 mmHg. The mean mitral valve gradient is 1.0 mmHg. Tricuspid Valve: The tricuspid valve is not well visualized. Tricuspid valve regurgitation is not demonstrated. Aortic Valve: The aortic valve was not well visualized. Aortic valve regurgitation is not visualized. Aortic valve mean gradient measures 1.0 mmHg. Aortic valve peak gradient measures 1.6 mmHg. Aortic valve area, by VTI measures 4.13 cm. Pulmonic Valve: The pulmonic valve was not well visualized. Pulmonic valve regurgitation is not visualized. Aorta: The aortic root is normal in size and structure. Venous: The inferior  vena cava is dilated in size with less than 50% respiratory variability, suggesting right atrial pressure of 15 mmHg. IAS/Shunts: The interatrial septum was not well visualized. Additional Comments: A device lead is visualized.  LEFT VENTRICLE PLAX 2D LVIDd:         4.80 cm      Diastology LVIDs:         3.20 cm       LV e' medial:    5.98 cm/s LV PW:         0.90 cm      LV E/e' medial:  15.6 LV IVS:        1.00 cm      LV e' lateral:   13.20 cm/s LVOT diam:     2.10 cm      LV E/e' lateral: 7.1 LV SV:         40 LV SV Index:   22 LVOT Area:     3.46 cm  LV Volumes (MOD) LV vol d, MOD A2C: 99.7 ml LV vol d, MOD A4C: 159.0 ml LV vol s, MOD A2C: 37.6 ml LV vol s, MOD A4C: 54.4 ml LV SV MOD A2C:     62.1 ml LV SV MOD A4C:     159.0 ml LV SV MOD BP:      80.6 ml RIGHT VENTRICLE             IVC RV Basal diam:  3.50 cm     IVC diam: 2.10 cm RV S prime:     11.00 cm/s TAPSE (M-mode): 2.5 cm LEFT ATRIUM             Index        RIGHT ATRIUM           Index LA diam:        3.70 cm 2.07 cm/m   RA Area:     15.10 cm LA Vol (A2C):   26.9 ml 15.05 ml/m  RA Volume:   38.70 ml  21.65 ml/m LA Vol (A4C):   40.8 ml 22.82 ml/m LA Biplane Vol: 36.2 ml 20.25 ml/m  AORTIC VALVE                    PULMONIC VALVE AV Area (Vmax):    3.55 cm     PV Vmax:       0.68 m/s AV Area (Vmean):   3.30 cm     PV Peak grad:  1.8 mmHg AV Area (VTI):     4.13 cm AV Vmax:           64.10 cm/s AV Vmean:          41.400 cm/s AV VTI:            0.097 m AV Peak Grad:      1.6 mmHg AV Mean Grad:      1.0 mmHg LVOT Vmax:         65.70 cm/s LVOT Vmean:        39.500 cm/s LVOT VTI:          0.116 m LVOT/AV VTI ratio: 1.19  AORTA Ao Root diam: 3.40 cm Ao Asc diam:  3.50 cm MITRAL VALVE MV Area (PHT): 4.71 cm    SHUNTS MV Area VTI:   2.59 cm    Systemic VTI:  0.12 m MV Peak grad:  2.9 mmHg    Systemic Diam: 2.10 cm MV Mean grad:  1.0 mmHg MV Vmax:  0.84 m/s MV Vmean:      43.0 cm/s MV Decel Time: 161 msec MV E velocity: 93.30 cm/s MV A velocity: 46.70 cm/s MV E/A ratio:  2.00 Toribio Fuel MD Electronically signed by Toribio Fuel MD Signature Date/Time: 01/02/2024/11:52:55 PM    Final    CT ABDOMEN PELVIS W CONTRAST Result Date: 01/01/2024 EXAM: CT ABDOMEN AND PELVIS WITH CONTRAST 01/01/2024 07:13:00 PM TECHNIQUE: CT of the abdomen and pelvis was  performed with the administration of 50 mL of iohexol  (OMNIPAQUE ) 350 MG/ML injection. Multiplanar reformatted images are provided for review. Automated exposure control, iterative reconstruction, and/or weight-based adjustment of the mA/kV was utilized to reduce the radiation dose to as low as reasonably achievable. COMPARISON: 12/22/2023 CLINICAL HISTORY: Bowel obstruction suspected. FINDINGS: LOWER CHEST: See chest CT report today. LIVER: The liver is unremarkable. GALLBLADDER AND BILE DUCTS: Gallbladder is unremarkable. No biliary ductal dilatation. SPLEEN: No acute abnormality. PANCREAS: No acute abnormality. ADRENAL GLANDS: No acute abnormality. KIDNEYS, URETERS AND BLADDER: No stones in the kidneys or ureters. No hydronephrosis. No perinephric or periureteral stranding. Urinary bladder is unremarkable. GI AND BOWEL: Gaseous distention of the bowel involves both large and small bowel; however, distal small bowel loops are decompressed. While I favor this most likely reflects ileus, it is difficult to completely exclude distal small bowel obstruction. Moderate stool burden throughout the colon. Normal appendix. PERITONEUM AND RETROPERITONEUM: No ascites. No free air. VASCULATURE: Aorta is normal in caliber. Changes of prior aortobifemoral bypass. LYMPH NODES: No lymphadenopathy. REPRODUCTIVE ORGANS: Prostate enlargement. 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 No focal soft tissue abnormality. IMPRESSION: 1. Gaseous distention of bowel involving both large and small bowel with decompressed distal small bowel loops, most likely ileus; distal small bowel obstruction cannot be completely excluded. 2. 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 Electronically signed by: Franky Crease MD 01/01/2024 07:31 PM EST RP Workstation: [id number]   CT Angio Chest PE W and/or Wo  Contrast Result Date: 01/01/2024 EXAM: CTA of the Chest without and with contrast for PE 01/01/2024 07:13:00 PM TECHNIQUE: CTA of the chest was performed without and with the administration of 50 mL of iohexol  (OMNIPAQUE ) 350 MG/ML injection. Multiplanar reformatted images are provided for review. MIP images are provided for review. Automated exposure control, iterative reconstruction, and/or weight based adjustment of the mA/kV was utilized to reduce the radiation dose to as low as reasonably achievable. COMPARISON: 12/22/2023 CLINICAL HISTORY: Pulmonary embolism (PE) suspected, high prob. FINDINGS: PULMONARY ARTERIES: Pulmonary arteries are adequately opacified for evaluation. No pulmonary embolism. Main pulmonary artery is normal in caliber. MEDIASTINUM: The heart and pericardium demonstrate no acute abnormality. There is no acute abnormality of the thoracic aorta. Right hilar and mediastinal adenopathy again noted, stable. LYMPH NODES: Right hilar and mediastinal adenopathy again noted, stable. No axillary lymphadenopathy. LUNGS AND PLEURA: Right upper lobe mass measures 3 x 3 cm on image 58. No focal consolidation or pulmonary edema. No pleural effusion or pneumothorax. UPPER ABDOMEN: Limited images of the upper abdomen are unremarkable. SOFT TISSUES AND BONES: Numerous lytic bony metastases throughout the ribs with soft tissue masses are unchanged. Stable lytic metastases throughout the thoracic spine. IMPRESSION: 1. No evidence of pulmonary embolism. 2. Stable right upper lobe mass with unchanged right hilar and mediastinal adenopathy and diffuse lytic osseous metastases in the ribs and thoracic spine. Electronically signed by: Franky Crease MD 01/01/2024 07:26 PM EST RP Workstation: [id number]  DG Chest 2 View Result Date: 01/01/2024 EXAM: 2 VIEW(S) XRAY OF THE CHEST 01/01/2024 06:36:16 PM COMPARISON: None available. CLINICAL HISTORY: chest pain FINDINGS: LINES, TUBES AND DEVICES: Left chest pacemaker  with leads overlying right atrium and right ventricle. LUNGS AND PLEURA: Right upper lung opacity. Right hilar asymmetric density. Ovoid pleural-based opacity at the right apex corresponding to rib lesion on CT. No pulmonary edema. No pleural effusion. No pneumothorax. HEART AND MEDIASTINUM: No acute abnormality of the cardiac an silhouettes. BONES AND SOFT TISSUES: Known right second and third rib lesions. IMPRESSION: 1. Right upper lung and hilar opacity corresponding to the mass recently demonstrated on CT. 2. Right second and third rib lesions corresponding to metastatic lesions seen on CT Electronically signed by: Luke Bun MD 01/01/2024 06:56 PM EST RP Workstation: [id number]   CUP PACEART INCLINIC DEVICE CHECK Result Date: 01/01/2024 in-clinic _dual__ chamber ICD check. Presenting Rhythm: AF/VS_ . Routine testing was performed. Thresholds, sensing, and impedance demonstrate stable parameters and no programming changes needed. No treated arrhythmias. Estimated longevity __7.5 years__ . Pt enrolled in remote follow-up. AFib started 11/4, overall burden low, though increased of lat A lead impedance on a gradual downtrend though appears to have leveled off, and remains WNL no VT RU  CT CHEST ABDOMEN PELVIS W CONTRAST Result Date: 12/22/2023 EXAM: CT CHEST, ABDOMEN AND PELVIS WITH CONTRAST 12/22/2023 07:48:41 PM TECHNIQUE: CT of the chest, abdomen and pelvis was performed with the administration of 100 mL of iohexol  (OMNIPAQUE ) 300 MG/ML solution. Multiplanar reformatted images are provided for review. Automated exposure control, iterative reconstruction, and/or weight based adjustment of the mA/kV was utilized to reduce the radiation dose to as low as reasonably achievable. COMPARISON: CT pelvis earlier today CLINICAL HISTORY: Metastatic disease evaluation. Metastatic disease eval, abnormal CT, c/o left hip pain that started 2-3 weeks ago. Pt reports pain radiates into his leg and worse with movement.  Pt denies falling or injury. FINDINGS: CHEST: MEDIASTINUM AND LYMPH NODES: Heart and pericardium are unremarkable. Left subclavian icd Moderate coronary atherosclerosis of the lad Thoracic aortic atherosclerosis The central airways are clear. 13 mm short axis right supraclavicular node (image 3). Additional thoracic lymphadenopathy, including a 2.3 cm right hilar node (image 25), and a 1.7 cm short axis subcarinal node (image 29). LUNGS AND PLEURA: 3.0 x 4.0 cm posterior right upper lobe/perihilar mass (image 45) compatible with primary bronchogenic carcinoma. No pleural effusion or pneumothorax. ABDOMEN AND PELVIS: LIVER: The liver is unremarkable. GALLBLADDER AND BILE DUCTS: Gallbladder is unremarkable. No biliary ductal dilatation. SPLEEN: No acute abnormality. PANCREAS: No acute abnormality. ADRENAL GLANDS: No acute abnormality. KIDNEYS, URETERS AND BLADDER: No stones in the kidneys or ureters. No hydronephrosis. No perinephric or periureteral stranding. Urinary bladder is unremarkable. GI AND BOWEL: Stomach demonstrates no acute abnormality. There is no bowel obstruction. REPRODUCTIVE ORGANS: Mild prostatomegaly. PERITONEUM AND RETROPERITONEUM: 2.5 cm soft tissue lesion beneath the left upper anterior abdominal wall (image 58) suggesting peritoneal metastasis in this clinical context. No ascites. No free air. VASCULATURE: Atherosclerotic calcification of the abdominal aorta. Aortobifemoral bypass graft, patent. ABDOMINAL AND PELVIS LYMPH NODES: No lymphadenopathy. BONES AND SOFT TISSUES: Lytic calcification metastases involving the anterior 2nd and posterior 3rd ribs (images 11 and 13) measuring up to 4.2 cm. Additional lytic soft tissue metastasis in the right posterior 8th rib. Lytic soft tissue metastasis involving the T9 and T11 vertebral bodies (sagittal image 102) with compression of the spinal canal at T11. Lytic soft tissue metastasis involving the left anterior acetabulum (image 113).  Dominant 5.9 x  7.4 cm lytic soft tissue metastasis involving the left hemipelvis, extending from the posterior acetabulum to the inferior pubic ramus (image 123). Additional scattered smaller lytic metastases in the visualized axial and appendicular skeleton. A 2.5 cm soft tissue lesion is noted beneath the left upper anterior abdominal wall (image 58). IMPRESSION: 1. 4.0 cm posterior right upper lobe/perihilar mass, compatible with primary bronchogenic carcinoma. 2. Thoracic nodal metastases, as above. A 13 mm short axis right supraclavicular node would be amenable to percutaneous sampling, as clinically warranted. 3. Suspected isolated peritoneal metastasis in the left upper abdomen. 4. Extensive osseous metastatic disease, as described above, including a T11 lesion narrowing the spinal canal. Consider MR with contrast for further evaluation, as clinically warranted. Electronically signed by: Pinkie Pebbles MD 12/22/2023 08:06 PM EDT RP Workstation: [id number]   CT PELVIS WO CONTRAST Result Date: 12/22/2023 CLINICAL DATA:  Possible lucent lesions on x-ray. Left hip and left thigh pain. No known injury. EXAM: CT PELVIS WITHOUT CONTRAST TECHNIQUE: Multidetector CT imaging of the pelvis was performed following the standard protocol without intravenous contrast. RADIATION DOSE REDUCTION: This exam was performed according to the departmental dose-optimization program which includes automated exposure control, adjustment of the mA and/or kV according to patient size and/or use of iterative reconstruction technique. COMPARISON:  12/22/2023. FINDINGS: Urinary Tract:  No abnormality visualized. Bowel: Unremarkable visualized pelvic bowel loops. Appendix appears normal. Vascular/Lymphatic: Findings suggestive of aortobifemoral bypass grafts. No pelvic lymphadenopathy by size criteria. Reproductive:  The prostate gland is enlarged. Other:  No ascites. Musculoskeletal: No acute fracture or dislocation is seen. Multiple lytic lesions  are seen scattered throughout the bones. There are large destructive lesions with soft tissue masses is involving the sacrum on the right, acetabulum on the left, and inferior pubic ramus on the left. Evaluation of the masses is limited due to lack of IV contrast. However the largest soft tissue mass is seen in the region the inferior pubic ramus on the left measuring 5.8 x 5.5 cm. IMPRESSION: Multiple lytic lesions scattered throughout the bones with large destructive lesionsand associated soft tissue masses involving the sacrum on the right, acetabulum on the left, and inferior pubic ramus on the left. No pathologic fracture is seen. Differential diagnosis includes multiple myeloma/plasmacytoma versus metastatic disease. Electronically Signed   By: Leita Birmingham M.D.   On: 12/22/2023 18:31   DG Femur Min 2 Views Left Result Date: 12/22/2023 EXAM: 2 VIEW(S) XRAY OF THE LEFT FEMUR 12/22/2023 05:53:00 PM COMPARISON: None available. CLINICAL HISTORY: left hip/femur pain. Per triage ;  Pt arrives with c/o left hip pain that started 2-3 weeks ago. Pt reports pain radiates into his leg and worse with movement. Pt denies falling or injury. ; Best lateral obtainable, pt had trouble staying still once in position FINDINGS: BONES AND JOINTS: Left inferior pubic ramus appears fractured and fragmented with some areas of focal lucency. The left femur appears intact. There are mild degenerative changes of the hip and knee. No joint dislocation. SOFT TISSUES: Peripheral vascular calcifications are present. IMPRESSION: 1. Fracture and fragmentation of the left inferior pubic ramus with focal lucency. 2. Mild degenerative changes of the hip and knee. Electronically signed by: Greig Pique MD 12/22/2023 06:10 PM EDT RP Workstation: [id number]   DG Pelvis 1-2 Views Result Date: 12/22/2023 EXAM: 1 or 2 VIEW(S) XRAY OF THE PELVIS 12/22/2023 04:57:18 PM COMPARISON: None available. CLINICAL HISTORY: Left hip pain. FINDINGS:  BONES AND JOINTS: Questionable erosive changes and lucent lesions in the  left inferior pubic ramus. No joint dislocation. SOFT TISSUES: Surgical clips overlie the bilateral inguinal regions. Peripheral vascular calcifications are present. STOMACH AND BOWEL: Diffuse gaseous distention of visualized colon. IMPRESSION: 1. Questionable erosive changes and lucent lesions in the left inferior pubic ramus. Recommend further evaluation with CT. 2. Diffuse gaseous distention of visualized colon. Electronically signed by: Greig Pique MD 12/22/2023 05:19 PM EDT RP Workstation: [id number]       IMPRESSION/PLAN: 1.      Osseous metastases from this stage IV non-small cell lung cancer of the right upper lung  We reviewed this patient's current workup.  He presents today with diffuse osseous metastatic disease from stage IV NSCLC.  He is unfortunately experiencing right-sided lower back pain and left hip pain, from his disease. Dr. Izell recommends a palliative course of radiation to the most worrisome lesions visualized in the left bony pelvis, S1 vertebra, and T-spine.   Today, I talked to the patient and family about the findings and work-up thus far.  We discussed the natural history of bony metastases and general treatment, highlighting the role of radiotherapy in the management.  We discussed the available radiation techniques, and focused on the details of logistics and delivery.  We reviewed the anticipated acute and late sequelae associated with radiation in this setting.  The patient was encouraged to ask questions that I answered to the best of my ability. A patient consent form was discussed and signed.  We retained a copy for our records.  The patient would like to proceed with radiation.  Recommend 4 mg TID Decadron  in efforts to reduce neurologic deficits prior to starting radiation treatment.  He is scheduled for CT simulation tomorrow and his first treatment on Friday, 01/17/2024.  Patient will be  transferred to [facility]. Dr. Izell anticipates 4 Gy in 1 fraction to the left pelvic, S1, and most worrisome T-spine metastases on Friday 11/21, followed by 24 Gy in 8 daily fractions to all sites.   Dr. Trixie contacted about steroid initiation - he will start patient on dexamethasone  for symptomatic relief and protection of the spinal cord.  In a visit lasting 60 minutes, greater than 50% of the time was spent face to face discussing the patient's condition, in preparation for the discussion, and coordinating the patient's care.     Leeroy Due, PA-C   Lauraine Izell, MD    [facility]  Radiation Oncology Direct Dial: [phone]  Fax: [phone] [email or web address]    **Disclaimer: This note was dictated with voice recognition software. Similar sounding words can inadvertently be transcribed and this note may contain transcription errors which may not have been corrected upon publication of note.THORA LILLETTE Lauraine Izell, MD, hereby attest that the information provided in this document is true and accurate to the best of my knowledge. I performed in the medical decision-making and I agree with the documentation and plan of care above. -----------------------------------  Lauraine Izell, MD

## 2024-01-15 NOTE — Evaluation (Signed)
 Physical Therapy Evaluation Patient Details Name: Peter Lambert MRN: [id number] DOB: 1945-05-21 Today's Date: 01/15/2024  History of Present Illness  Peter Lambert is a 78 y.o. male who was brought to ED due to declining over the last four weeks, pain in both feet, back, and L hip, weakness in LEs, and increased falls. MRI lumbar spine showed known widespread osseous metastases including a destructive 4 cm S1 lesion. No evidence of epidural tumor or spinal stenosis in the lumbar spine.  MRI thoracic spine showed known widespread osseous metastasis with pathology compression fracture with 10% height loss and T9 and 45% height loss at T11.  Epidural tumor at T11 resulted in the moderate spinal stenosis.  PMH: HTN, HLD, persistent a-fib on Eliquis , CAD s/p DES to LAD in 2010, V-fib arrest in 2021 in setting of occluded RCA, tobacco abuse PAD with remote history of aortofemoral bypass grafting   Clinical Impression  Pt admitted with above. PTA pt indep without AD up until recently when he demo'd weakness and numbness in LEs requiring use of SPC and/or RW. Unsure of medical plan at this time however I suspect pt can return home with 24/7 assist as pt unable to safely care for self at this time as he is at increased fall risk and demo's increased back at sight of lesions in low back limiting sitting and activity tolerance. 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 Acute PT to cont to follow. Pt to benefit from HHPT services to address above deficits and progress towards indep.      If plan is discharge home, recommend the following: A little help with walking and/or transfers;A little help with bathing/dressing/bathroom;Assist for transportation;Help with stairs or ramp for entrance   Can travel by private vehicle        Equipment Recommendations None recommended by PT (has RW and [facility])  Recommendations for Other Services       Functional Status Assessment Patient has had a  recent decline in their functional status and demonstrates the ability to make significant improvements in function in a reasonable and predictable amount of time.     Precautions / Restrictions Precautions Precautions: Fall Precaution/Restrictions Comments: impaired sensation in bilat LEs Restrictions Weight Bearing Restrictions Per Provider Order: No      Mobility  Bed Mobility Overal bed mobility: Needs Assistance Bed Mobility: Rolling, Sidelying to Sit Rolling: Used rails Sidelying to sit: Min assist       General bed mobility comments: increased time due to pain, used R bed rail to aide in rolling to the R, increased time to bring LEs off EOB, minA for trunk elevation    Transfers Overall transfer level: Needs assistance Equipment used: Rolling walker (2 wheels) Transfers: Sit to/from Stand, Bed to chair/wheelchair/BSC Sit to Stand: Min assist   Step pivot transfers: Contact guard assist       General transfer comment: increased time, minA to steady pt during transition of hands from bed to RW, pt able to march in place and step to chair, minA for walker management during turn    Ambulation/Gait               General Gait Details: limited to marching in place and taking steps to chair  Stairs            Wheelchair Mobility     Tilt Bed    Modified Rankin (Stroke Patients Only)       Balance Overall balance assessment: Needs  assistance Sitting-balance support: Feet supported, Bilateral upper extremity supported Sitting balance-Leahy Scale: Fair Sitting balance - Comments: uses bilat UE to elevate self slightly to offweight bottom to aide in pain management   Standing balance support: Bilateral upper extremity supported, During functional activity, Reliant on assistive device for balance Standing balance-Leahy Scale: Poor Standing balance comment: reliant on RW                             Pertinent Vitals/Pain Pain  Assessment Pain Assessment: Faces Faces Pain Scale: Hurts even more Pain Location: low back pain, primary L side, increased with sitting Pain Descriptors / Indicators: Aching Pain Intervention(s): Monitored during session    Home Living Family/patient expects to be discharged to:: Private residence Living Arrangements: Alone Available Help at Discharge: Family;Available 24 hours/day (reports he has 7 family members that are on rotation to stay with hime) Type of Home: House Home Access: Stairs to enter Entrance Stairs-Rails: None Entrance Stairs-Number of Steps: 1 (threshold)   Home Layout: One level Home Equipment: Cane - single Librarian, Academic (2 wheels)      Prior Function Prior Level of Function : Independent/Modified Independent (up until 4 weeks ago)             Mobility Comments: was indep without AD up until a month ago, started using cane ADLs Comments: indep up until a month ago, likes to cook and experiment with recipes     Extremity/Trunk Assessment   Upper Extremity Assessment Upper Extremity Assessment: Defer to OT evaluation    Lower Extremity Assessment Lower Extremity Assessment: RLE deficits/detail;LLE deficits/detail RLE Deficits / Details: grossly 3+/5, 50% impaired sensation, reports numbness RLE Sensation: decreased light touch LLE Deficits / Details: grossly 3/5, impaired sensation LLE Sensation: decreased light touch (reports about 50% deficit)    Cervical / Trunk Assessment Cervical / Trunk Assessment: Other exceptions Cervical / Trunk Exceptions: noted metastatic lesions t/o spine  Communication   Communication Communication: No apparent difficulties    Cognition Arousal: Alert Behavior During Therapy: WFL for tasks assessed/performed   PT - Cognitive impairments: No apparent impairments                       PT - Cognition Comments: pt aware of deficits and limitations, Following commands: Intact       Cueing  Cueing Techniques: Verbal cues, Tactile cues     General Comments General comments (skin integrity, edema, etc.): VSS    Exercises     Assessment/Plan    PT Assessment Patient needs continued PT services  PT Problem List Decreased strength;Decreased activity tolerance;Decreased balance;Decreased mobility;Decreased knowledge of use of DME       PT Treatment Interventions DME instruction;Gait training;Stair training;Functional mobility training;Therapeutic activities;Therapeutic exercise;Balance training;Neuromuscular re-education    PT Goals (Current goals can be found in the Care Plan section)  Acute Rehab PT Goals Patient Stated Goal: get better PT Goal Formulation: With patient Time For Goal Achievement: 01/29/24 Potential to Achieve Goals: Fair    Frequency Min 3X/week     Co-evaluation               AM-PAC PT 6 Clicks Mobility  Outcome Measure Help needed turning from your back to your side while in a flat bed without using bedrails?: A Little Help needed moving from lying on your back to sitting on the side of a flat bed without using bedrails?: A Little Help needed  moving to and from a bed to a chair (including a wheelchair)?: A Little Help needed standing up from a chair using your arms (e.g., wheelchair or bedside chair)?: A Little Help needed to walk in hospital room?: Total (< 20') Help needed climbing 3-5 steps with a railing? : Total 6 Click Score: 14    End of Session Equipment Utilized During Treatment: Gait belt Activity Tolerance: Patient tolerated treatment well Patient left: in chair;with call bell/phone within reach Nurse Communication: Mobility status PT Visit Diagnosis: Unsteadiness on feet (R26.81);Muscle weakness (generalized) (M62.81);History of falling (Z91.81)    Time: 9191-9164 PT Time Calculation (min) (ACUTE ONLY): 27 min   Charges:   PT Evaluation $PT Eval Moderate Complexity: 1 Mod PT Treatments $Therapeutic Activity: 8-22  mins PT General Charges $$ ACUTE PT VISIT: 1 Visit         Norene Ames, PT, DPT Acute Rehabilitation Services Secure chat preferred Office #: [phone]   Norene CHRISTELLA Ames 01/15/2024, 9:17 AM

## 2024-01-15 NOTE — Evaluation (Signed)
 Occupational Therapy Evaluation Patient Details Name: Peter Lambert MRN: [id number] DOB: 1945-10-23 Today's Date: 01/15/2024   History of Present Illness   Peter Lambert is a 78 y.o. male who was brought to ED due to declining over the last four weeks, pain in both feet, back, and L hip, weakness in LEs, and increased falls. MRI lumbar spine showed known widespread osseous metastases including a destructive 4 cm S1 lesion. No evidence of epidural tumor or spinal stenosis in the lumbar spine.  MRI thoracic spine showed known widespread osseous metastasis with pathology compression fracture with 10% height loss and T9 and 45% height loss at T11.  Epidural tumor at T11 resulted in the moderate spinal stenosis.  PMH: HTN, HLD, persistent a-fib on Eliquis , CAD s/p DES to LAD in 2010, V-fib arrest in 2021 in setting of occluded RCA, tobacco abuse PAD with remote history of aortofemoral bypass grafting     Clinical Impressions Pt admitted with concerns stated above. Pt currently with functional limitations due to the deficits listed below (see OT Problem List). Pt lives alone and is independent/mod I at baseline with use of RW or cane. Pt will benefit from acute skilled OT to increase their safety and independence with ADL and functional mobility for ADL to facilitate discharge. Primarily limited by increase pain d/t to metastic disease of spine. Would benefit from follow up home health OT to help with pain management techniques and allow pt to complete daily tasks with increased independence. Will continue to follow patient acutely.       If plan is discharge home, recommend the following:   A little help with bathing/dressing/bathroom;Assistance with cooking/housework;Assist for transportation     Functional Status Assessment   Patient has had a recent decline in their functional status and demonstrates the ability to make significant improvements in function in a reasonable and predictable  amount of time.     Equipment Recommendations   Other (comment);Tub/shower seat (Pt unsure if he has a shower chair. Education provided on use and pt will check once he returns home.)      Precautions/Restrictions   Precautions Precautions: Fall Precaution/Restrictions Comments: impaired sensation in bilat LEs Restrictions Weight Bearing Restrictions Per Provider Order: No     Mobility Bed Mobility Overal bed mobility: Needs Assistance Bed Mobility: Rolling, Sidelying to Sit Rolling: Used rails Sidelying to sit: Min assist       General bed mobility comments: increased time due to pain, used R bed rail to aide in rolling to the R, increased time to bring LEs off EOB, minA for trunk elevation    Transfers Overall transfer level: Needs assistance Equipment used: Rolling walker (2 wheels) Transfers: Sit to/from Stand, Bed to chair/wheelchair/BSC Sit to Stand: Min assist     Step pivot transfers: Contact guard assist     General transfer comment: increased time, minA to steady pt during transition of hands from bed to RW, pt able to march in place and step to chair, minA for walker management during turn      Balance Overall balance assessment: Needs assistance Sitting-balance support: Feet supported, Bilateral upper extremity supported Sitting balance-Leahy Scale: Fair Sitting balance - Comments: uses bilat UE to elevate self slightly to offweight bottom to aide in pain management   Standing balance support: Bilateral upper extremity supported, During functional activity, Reliant on assistive device for balance Standing balance-Leahy Scale: Poor Standing balance comment: reliant on RW          ADL either  performed or assessed with clinical judgement   ADL     General ADL Comments: CGA overall for ADL d/t back pain. Slow to perform requiring increased time.     Vision Baseline Vision/History: 1 Wears glasses Ability to See in Adequate Light: 0  Adequate Patient Visual Report: No change from baseline Vision Assessment?: No apparent visual deficits     Perception Perception: Within Functional Limits       Praxis Praxis: WFL       Pertinent Vitals/Pain Pain Assessment Pain Assessment: Faces Faces Pain Scale: Hurts even more Pain Location: low back pain, primary L side, increased with sitting Pain Descriptors / Indicators: Aching     Extremity/Trunk Assessment Upper Extremity Assessment Upper Extremity Assessment: Right hand dominant;Generalized weakness (pain limiting should strength)   Lower Extremity Assessment Lower Extremity Assessment: Defer to PT evaluation RLE Deficits / Details: grossly 3+/5, 50% impaired sensation, reports numbness RLE Sensation: decreased light touch LLE Deficits / Details: grossly 3/5, impaired sensation LLE Sensation:  (reports about 50% deficit)   Cervical / Trunk Assessment Cervical / Trunk Assessment: Other exceptions Cervical / Trunk Exceptions: noted metastatic lesions t/o spine   Communication Communication Communication: No apparent difficulties   Cognition Arousal: Alert Behavior During Therapy: WFL for tasks assessed/performed Cognition: No apparent impairments    Following commands: Intact       Cueing  General Comments   Cueing Techniques: Verbal cues  VSS           Home Living Family/patient expects to be discharged to:: Private residence Living Arrangements: Alone Available Help at Discharge: Family;Available 24 hours/day (reports he has 7 family members that are on rotation to stay with hime) Type of Home: House Home Access: Stairs to enter [employer] of Steps: 1 (threshold) Entrance Stairs-Rails: None Home Layout: One level     Bathroom Shower/Tub: Chief Strategy Officer: Handicapped height Bathroom Accessibility: Yes   Home Equipment: Cane - single Librarian, Academic (2 wheels);Wheelchair - manual   Additional  Comments: unsure if he has a shower chair.      Prior Functioning/Environment Prior Level of Function : Independent/Modified Independent (up until 4 weeks ago)    Mobility Comments: was indep without AD up until a month ago, started using cane ADLs Comments: indep up until a month ago, likes to cook and experiment with recipes. Was driving until pain became worse. Uses motorized cart to grocery shop    OT Problem List: Decreased strength;Decreased activity tolerance;Impaired balance (sitting and/or standing);Pain   OT Treatment/Interventions: Self-care/ADL training;Therapeutic exercise;DME and/or AE instruction;Manual therapy;Modalities;Therapeutic activities;Balance training;Patient/family education      OT Goals(Current goals can be found in the care plan section)   Acute Rehab OT Goals Patient Stated Goal: to decrease pain OT Goal Formulation: With patient Time For Goal Achievement: 01/29/24 Potential to Achieve Goals: Good   OT Frequency:  Min 2X/week       AM-PAC OT 6 Clicks Daily Activity     Outcome Measure Help from another person eating meals?: None Help from another person taking care of personal grooming?: None Help from another person toileting, which includes using toliet, bedpan, or urinal?: A Little Help from another person bathing (including washing, rinsing, drying)?: A Little Help from another person to put on and taking off regular upper body clothing?: None Help from another person to put on and taking off regular lower body clothing?: A Little 6 Click Score: 21   End of Session Equipment Utilized During Treatment: Rolling  walker (2 wheels)  Activity Tolerance: Patient limited by pain Patient left: in bed;with call bell/phone within reach;with bed alarm set  OT Visit Diagnosis: Muscle weakness (generalized) (M62.81);Pain Pain - part of body:  (low back)                Time: 9142-9087 OT Time Calculation (min): 15 min Charges:  OT General  Charges $OT Visit: 1 Visit OT Evaluation $OT Eval Low Complexity: 1 Low  Leita Howell, OTR/L,CBIS  Supplemental OT - [facility] and [facility] Secure Chat Preferred    Linzy Darling, Leita BIRCH 01/15/2024, 9:27 AM

## 2024-01-15 NOTE — Care Management Important Message (Signed)
 Important Message  Patient Details  Name: Peter Lambert MRN: [id number] Date of Birth: 1945-08-15   Important Message Given:  Yes - Medicare IM     Claretta Deed 01/15/2024, 4:20 PM

## 2024-01-16 ENCOUNTER — Ambulatory Visit: Admitting: Radiation Oncology

## 2024-01-16 DIAGNOSIS — C7951 Secondary malignant neoplasm of bone: Secondary | ICD-10-CM | POA: Diagnosis not present

## 2024-01-16 DIAGNOSIS — G893 Neoplasm related pain (acute) (chronic): Secondary | ICD-10-CM | POA: Diagnosis not present

## 2024-01-16 DIAGNOSIS — C3411 Malignant neoplasm of upper lobe, right bronchus or lung: Secondary | ICD-10-CM

## 2024-01-16 DIAGNOSIS — Z510 Encounter for antineoplastic radiation therapy: Secondary | ICD-10-CM | POA: Insufficient documentation

## 2024-01-16 DIAGNOSIS — R29898 Other symptoms and signs involving the musculoskeletal system: Secondary | ICD-10-CM | POA: Diagnosis not present

## 2024-01-16 LAB — GLUCOSE, CAPILLARY
Glucose-Capillary: 129 mg/dL — ABNORMAL HIGH (ref 70–99)
Glucose-Capillary: 189 mg/dL — ABNORMAL HIGH (ref 70–99)
Glucose-Capillary: 203 mg/dL — ABNORMAL HIGH (ref 70–99)
Glucose-Capillary: 192 mg/dL — ABNORMAL HIGH (ref 70–99)

## 2024-01-16 LAB — TSH: TSH: 1.25 u[IU]/mL (ref 0.350–4.500)

## 2024-01-16 LAB — COMPREHENSIVE METABOLIC PANEL WITH GFR
ALT: 29 U/L (ref 0–44)
AST: 27 U/L (ref 15–41)
Albumin: 3.3 g/dL — ABNORMAL LOW (ref 3.5–5.0)
Alkaline Phosphatase: 89 U/L (ref 38–126)
Anion gap: 8 (ref 5–15)
BUN: 32 mg/dL — ABNORMAL HIGH (ref 8–23)
CO2: 27 mmol/L (ref 22–32)
Calcium: 10.8 mg/dL — ABNORMAL HIGH (ref 8.9–10.3)
Chloride: 98 mmol/L (ref 98–111)
Creatinine, Ser: 1.24 mg/dL (ref 0.61–1.24)
GFR, Estimated: 59 mL/min — ABNORMAL LOW (ref >60)
Glucose, Bld: 166 mg/dL — ABNORMAL HIGH (ref 70–99)
Potassium: 4.7 mmol/L (ref 3.5–5.1)
Sodium: 133 mmol/L — ABNORMAL LOW (ref 135–145)
Total Bilirubin: 0.3 mg/dL (ref 0.0–1.2)
Total Protein: 6.5 g/dL (ref 6.5–8.1)

## 2024-01-16 MED ORDER — METOPROLOL TARTRATE 5 MG/5ML IV SOLN: 5.0000 mg | INTRAVENOUS | Status: DC | PRN

## 2024-01-16 MED ORDER — HYDRALAZINE HCL 20 MG/ML IJ SOLN: 10.0000 mg | INTRAMUSCULAR | Status: DC | PRN

## 2024-01-16 MED ORDER — IPRATROPIUM-ALBUTEROL 0.5-2.5 (3) MG/3ML IN SOLN
3.0000 mL | RESPIRATORY_TRACT | Status: DC | PRN
  Administered 2024-01-16: 3 mL via RESPIRATORY_TRACT
  Filled 2024-01-16: qty 3

## 2024-01-16 MED ORDER — SENNOSIDES-DOCUSATE SODIUM 8.6-50 MG PO TABS: 1.0000 | ORAL_TABLET | Freq: Every evening | ORAL | Status: DC | PRN

## 2024-01-16 NOTE — Progress Notes (Signed)
 PROGRESS NOTE    Clancy Mullarkey Luginbill  FMW:[id number] DOB: 21-Aug-1945 DOA: 01/13/2024 PCP: Marvine Rush, MD    Brief Narrative:  78 year old with history of tobacco use, HLD, HTN, DM 2, persistent A-fib on Eliquis , CAD with PCI 2010, V-fib arrest 2021 from occluded RCA status post ICD, PAD with history of aortofemoral bypass grafting came to the hospital with functional decline over the last several weeks and worsening back pain.  He was recently diagnosed of lung cancer, underwent bronchoscopy on 11/11 and found to have squamous cell cancer.   Assessment & Plan:  Squamous cell carcinoma of lung with bone and spine metastases Compression thoracic spine fractures, pathologic -Patient underwent bronchoscopy 11/11 showing lung mass and biopsy consistent with squamous cell carcinoma.  Patient follows at [facility] cancer center.  Over the last several days he has had functional decline with lower back pain and weakness.  MRI of thoracic and lumbar spine showed widespread spinal lesions with pathologic compression of T9 and T11 and epidural tumor at T11 with moderate spinal stenosis and a sacral lesion.  Patient was seen by neurosurgery who recommended against surgical intervention at this point  -Radiation oncology consulted who is recommending Decadron  and radiation treatment.  Plans for CT simulation today followed by radiation starting 11/21.   Hypercalcemia of malignancy In the setting of underlying malignancy.  Initial calcium  over 12.  After Zometa and IV fluids this is improved.  Will continue to daily monitor calcium  levels   Hypomagnesemia  - Replete and continue to monitor   Persistent atrial fibrillation  -On Eliquis , Coreg .  IV as needed   Essential hypertension  -On Coreg .  IV as needed   COPD  -Currently stable.  Will order as needed bronchodilators   Coronary disease status post PCI Overall appears to be stable, does not have any chest pain.  Will continue Eliquis , statin  and Coreg    Diabetes mellitus type 2  -continue sliding scale    Chronic HFpEF  -Clinically appears to be euvolemic.  Echocardiogram on 01/02/2024 showed preserved EF.   Mixed hyperlipidemia  -Continue Lipitor    Hypothyroidism  -Continue Synthroid   Tobacco abuse -  Tobacco cessation discussed   DVT prophylaxis: SCDs Start: 01/14/24 0146 apixaban  (ELIQUIS ) tablet 5 mg      Code Status: Full Code Family Communication:   Status is: Inpatient Remains inpatient appropriate because: On going radiation   PT Follow up Recs: Home Health Pt11/19/2025 0800  Subjective: Seen at bedside.  Son is also present at bedside.  They tell me they do not know of their cancer diagnosis therefore I went over explaining that how the biopsy results showed squamous cell carcinoma and we are concerned that he has metastasis to his back causing his lower back pain, compression fractures.  They are also aware that radiation oncology team will be performing CT simulation today followed by radiation tomorrow and oncology team will be assessing them.     Examination:  General exam: Appears calm and comfortable  Respiratory system: Clear to auscultation. Respiratory effort normal. Cardiovascular system: S1 & S2 heard, RRR. No JVD, murmurs, rubs, gallops or clicks. No pedal edema. Gastrointestinal system: Abdomen is nondistended, soft and nontender. No organomegaly or masses felt. Normal bowel sounds heard. Central nervous system: Alert and oriented. No focal neurological deficits. Extremities: Symmetric 5 x 5 power. Skin: No rashes, lesions or ulcers Psychiatry: Judgement and insight appear normal. Mood & affect appropriate.  Wound 01/14/24 1830 Pressure Injury Sacrum Bilateral Stage 2 -  Partial thickness loss of dermis presenting as a shallow open injury with a red, pink wound bed without slough. (Active)     Diet Orders (From admission, onward)     Start     Ordered    01/14/24 0147  Diet Heart Room service appropriate? Yes; Fluid consistency: Thin  Diet effective now       Question Answer Comment  Room service appropriate? Yes   Fluid consistency: Thin      01/14/24 0147            Objective: Vitals:   01/15/24 2052 01/16/24 0034 01/16/24 0445 01/16/24 0818  BP: 133/61 111/61 112/64 131/65  Pulse: 68 71 79 89  Resp: 18 16 18    Temp: 98.2 F (36.8 C) 98.4 F (36.9 C) 98 F (36.7 C)   TempSrc: Oral  Oral   SpO2:  100% 100%   Weight:      Height:        Intake/Output Summary (Last 24 hours) at 01/16/2024 1148 Last data filed at 01/15/2024 1816 Gross per 24 hour  Intake 240 ml  Output 400 ml  Net -160 ml   Filed Weights   01/13/24 1429  Weight: 64.4 kg    Scheduled Meds:  apixaban   5 mg Oral BID   atorvastatin   80 mg Oral Daily   carvedilol   3.125 mg Oral Q breakfast   dexamethasone   4 mg Oral Q8H   feeding supplement  237 mL Oral TID BM   insulin  aspart  0-9 Units Subcutaneous TID WC   levothyroxine   50 mcg Oral QAC breakfast   multivitamin with minerals  1 tablet Oral Daily   Continuous Infusions:  Nutritional status Signs/Symptoms: estimated needs Interventions: Ensure Enlive (each supplement provides 350kcal and 20 grams of protein), Magic cup, MVI Body mass index is 21.59 kg/m.  Data Reviewed:   CBC: Recent Labs  Lab 01/13/24 1505 01/14/24 0232 01/15/24 0405  WBC 14.6* 11.5* 15.2*  HGB 10.6* 9.9* 9.0*  HCT 33.7* 32.3* 28.4*  MCV 84.0 85.0 81.8  PLT 365 281 276   Basic Metabolic Panel: Recent Labs  Lab 01/13/24 1505 01/14/24 0232 01/15/24 0405 01/16/24 0854  NA 139 138 135 133*  K 4.0 4.2 4.6 4.7  CL 98 98 100 98  CO2 30 30 25 27   GLUCOSE 133* 119* 134* 166*  BUN 20 21 29* 32*  CREATININE 1.15 1.17 1.22 1.24  CALCIUM  11.9* 11.6* 10.5* 10.8*  MG  --  1.6* 1.7  --   PHOS  --  2.7  --   --    GFR: Estimated Creatinine Clearance: 43.3 mL/min (by C-G formula based on SCr of 1.24 mg/dL). Liver  Function Tests: Recent Labs  Lab 01/13/24 1505 01/14/24 0232 01/15/24 0405 01/16/24 0854  AST 26 25 22 27   ALT 23 19 24 29   ALKPHOS 111 99 72 89  BILITOT 0.5 0.5 0.6 0.3  PROT 7.2 6.7 6.0* 6.5  ALBUMIN 3.5 3.3* 2.3* 3.3*   No results for input(s): LIPASE, AMYLASE in the last 168 hours. No results for input(s): AMMONIA in the last 168 hours. Coagulation Profile: No results for input(s): INR, PROTIME in the last 168 hours. Cardiac Enzymes: No results for input(s): CKTOTAL, CKMB, CKMBINDEX, TROPONINI in the last 168 hours. BNP (last 3 results) No results for input(s): PROBNP in the last 8760 hours. HbA1C: Recent Labs    01/14/24 0232  HGBA1C 6.8*  CBG: Recent Labs  Lab 01/15/24 1150 01/15/24 1619 01/15/24 2220 01/16/24 0736 01/16/24 1138  GLUCAP 99 120* 100* 129* 189*   Lipid Profile: No results for input(s): CHOL, HDL, LDLCALC, TRIG, CHOLHDL, LDLDIRECT in the last 72 hours. Thyroid  Function Tests: Recent Labs    01/16/24 0854  TSH 1.250   Anemia Panel: No results for input(s): VITAMINB12, FOLATE, FERRITIN, TIBC, IRON, RETICCTPCT in the last 72 hours. Sepsis Labs: No results for input(s): PROCALCITON, LATICACIDVEN in the last 168 hours.  No results found for this or any previous visit (from the past 240 hours).       Radiology Studies: No results found.         LOS: 3 days   Time spent= 35 mins    Burgess JAYSON Dare, MD [facility]  If 7PM-7AM, please contact night-coverage  01/16/2024, 11:48 AM

## 2024-01-16 NOTE — Plan of Care (Signed)

## 2024-01-16 NOTE — Consult Note (Addendum)
 ADDENDUM:  Patient was personally and independently interviewed, examined and relevant elements of the history of present illness were reviewed in details and an assessment and plan was created. All elements of the patient's history of present illness, assessment and plan were discussed in detail with Peter JINNY Brunner, NP. The above documentation reflects our combined findings assessment and plan.   Briefly, 78 y.o. gentleman who initially presented to the emergency room for hip pain on 12/22/2023 and was found to have a lytic lesion in the acetabulum.  CT chest, abdomen and pelvis demonstrated right lung mass with hilar and mediastinal adenopathy and diffuse lytic osseous metastasis, most concerning for metastatic lung primary.  [facility] by Delon Hope, NP on 12/27/2023 and additional workup was recommended.  On 01/09/2024, PET/CT showed right upper lobe mass which was hypermetabolic.  Also noted was hypermetabolic mediastinal adenopathy; widespread osseous metastatic disease with a T11 lesion with associated anterior epidermal tumor; and hypermetabolic peritoneal nodularity in the left upper quadrant. Patient underwent bronchoscopy and biopsy on 01/09/2024.  Surgical pathology revealed non-small cell carcinoma in the right upper lobe.  Lymph nodes 4R, 7, 11R, were all sampled and showed non-small cell carcinoma.  The FNA of lymph node 7 showed findings consistent with squamous cell carcinoma.     He was scheduled to return to [facility] to proceed with the treatment plan however he presented to the emergency room on 01/13/2024 with bilateral foot, hip, and back pain.  MRI of the thoracic/lumbar spine obtained that day showed widespread osseous metastases, pathologic compression fractures with 10% height loss at T9 and 45% height loss at T11; destructive 4 cm S1 lesion; no evidence of epidural tumor or spinal stenosis in the lumbar spine.  Patient was admitted to [facility] for further workup.   Dr. Colon, neurosurgery evaluated the patient on 01/14/2024 and recommended conservative treatment with no surgery.  Dr. Izell with radiation oncology evaluated the patient and plan is to proceed with palliative radiation to the painful lesions.  Our service was consulted today for management decisions.  Patient's performance status was apparently fairly good until recently.  His performance status has been declined because of ongoing pain issues.  Today I discussed the diagnosis, staging, prognosis, plan of care, treatment options.  Explained to him that all treatment options are palliative in nature and not curative in intent.  He verbalized understanding.  Surgery is not an option because of metastatic disease.  Discussed other options of palliative systemic chemotherapy with immunotherapy.  Briefly discussed side effect profile.  Will also submit NGS testing on the specimen to look for any actionable mutations.  At the end of discussion, patient wanted to continue the full scope of treatments.  Will arrange for Port-A-Cath placement ASAP so that treatment can be expedited in the outpatient setting.  If no actionable mutations, dose reduced carboplatin/paclitaxel plus pembrolizumab could be an option for him.  Please proceed with MRI of the brain for staging.  He will establish with [facility] once he gets discharged, hopefully within the next week or so.  He lives approximately 10 minutes from [facility].  She has been informed of recent developments.  Please call us  with any further questions or concerns.  Thank you for giving us  an opportunity to participate in his care.  [facility] CONSULT NOTE  Patient Care Team: Peter Rush, MD as PCP - General (Family Medicine) [facility] JINNY, MD as PCP -  Cardiology (Cardiology) Peter Dena RAMAN, RN as Oncology Nurse Navigator  CHIEF COMPLAINTS/PURPOSE OF CONSULTATION:  Lung  cancer  REFERRING PHYSICIAN: Dr. Caleen  HISTORY OF PRESENTING ILLNESS:  [facility] 78 y.o. male admitted 01/13/2024 with complaints of weakness.  Patient was previously diagnosed with squamous cell lung carcinoma 1 week ago and had been scheduled to see [facility] as outpatient.  Due to this hospital admission, oncology evaluation has been requested. Patient is seen laying in bed with O2 via nasal cannula.  He is weak appearing.  Denies headaches or acute GI symptoms.  Reports that he has a lot of back pain.  Patient noted to be falling asleep during assessment and exam and his sister who is at bedside answered most of the questions. Patient's sister reports that he had gone to the ER in  due to the increasing pain in his hips and subsequent workup showed malignancy. Medical history includes A-fib, hypertension, hyperlipidemia, diabetes and NSTEMI. Surgical history includes right and left heart cath. Family oncologic history is significant for father with prostate cancer, mother with colon cancer, brother with colon cancer. Social history significant for being a former tobacco user greater than 50 years, 1-1/2 packs/day from age 34, quit 10 years ago.  Admits to former alcohol use which he states he quit 20 years ago.  Denies recreational or illicit drug use.  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  Patient's sister reports that he lives alone.  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 - Status post bronchoscopy done 01/07/2024 which showed lung mass  which was biopsied. -- Pathology confirmed non-small cell carcinoma consistent with squamous cell carcinoma. - PET scan done 01/09/2024 showed widespread bone mets. - Medical oncology/Dr. Tava Peery following for this hospitalization.  Patient follows with [facility].  Pathologic fractures T-spine Back pain - Over the last several days patient has had a decline due to lower back pain and increasing weakness.  Family member states that patient has lived by himself and ambulated with a cane which has been difficult to do within the last week or so. - Imaging done shows pathologic compression fractures with epidural tumor at T11.  There is also S1 lesion. - Radiation oncology evaluation and management in progress, plans for CT SIM followed by RT likely 11/21.  Anemia, likely from malignancy. - Hemoglobin 9.0 - Recommend PRBC transfusion for hemoglobin <7.0 - Continue to monitor CBC with differential  A-fib - On Eliquis  - Monitor for bleeding  Hypercalcemia of malignancy - Calcium  level 11.9 on admission.  Received Zometa.  Calcium  level decreasing 10.8 today. - Continue IV fluids as ordered - Continue to monitor calcium  levels closely   MEDICAL HISTORY:  Past Medical History:  Diagnosis Date   Acute upper GI bleed    Anemia    Anticoagulated on apixaban     Cardiac arrest with ventricular fibrillation (HCC) 2021   CHF (congestive heart failure) (HCC)    Chronic a-fib (HCC)    Coronary artery disease    a.  s/p NSTEMI in 2010 with DES to proximal LAD with D1 jailed and angioplasty alone to ostium   Dyspnea    GERD (gastroesophageal reflux disease)    Hyperlipidemia    Hypertension    Mediastinal lymphadenopathy    NSTEMI (non-ST elevated myocardial infarction) (HCC) 2010   Peripheral arterial disease    S/P coronary artery stent placement    S/P ICD (internal cardiac defibrillator) procedure    Tobacco abuse    Type 2 diabetes mellitus (HCC)      SURGICAL HISTORY: Past Surgical History:  Procedure Laterality Date   CARDIAC SURGERY     COLONOSCOPY WITH PROPOFOL  N/A 01/08/2020   Procedure: COLONOSCOPY WITH PROPOFOL ;  Surgeon: Cindie Carlin POUR, DO;  Location: [facility];  Service: Endoscopy;  Laterality: N/A;   COLONOSCOPY WITH PROPOFOL  N/A 05/03/2020   Hung:entire examined colon normal   CORONARY STENT INTERVENTION N/A 09/03/2019   Procedure: CORONARY STENT INTERVENTION;  Surgeon: Verlin Lonni BIRCH, MD;  Location: [facility] INVASIVE CV LAB;  Service: Cardiovascular;  Laterality: N/A;   CORONARY STENT PLACEMENT     ENDOBRONCHIAL ULTRASOUND Bilateral 01/07/2024   Procedure: ENDOBRONCHIAL ULTRASOUND (EBUS);  Surgeon: Malka Domino, MD;  Location: [facility] ORS;  Service: Pulmonary;  Laterality: Bilateral;   ENTEROSCOPY N/A 05/03/2020   hung:normal esophagus, normal stomach. Three non-bleeding angiodysplastic lesions in the duodenum. Treated with a monopolar probe. A few non-bleeding angiodysplastic lesions in the jejunum. Treated with a monopolar probe. No specimens collected.   ESOPHAGOGASTRODUODENOSCOPY (EGD) WITH PROPOFOL  N/A 01/06/2020   Procedure: ESOPHAGOGASTRODUODENOSCOPY (EGD) WITH PROPOFOL ;  Surgeon: Golda Claudis PENNER, MD;  Location: [facility];  Service: Endoscopy;  Laterality: N/A;   HOT HEMOSTASIS N/A 05/03/2020   Procedure: HOT HEMOSTASIS (ARGON PLASMA COAGULATION/BICAP);  Surgeon: Rollin Dover, MD;  Location: [facility] ENDOSCOPY;  Service: Gastroenterology;  Laterality: N/A;   ICD IMPLANT N/A 09/04/2019   Procedure: ICD IMPLANT;  Surgeon: Waddell Danelle ORN, MD;  Location: [facility]VASIVE CV LAB;  Service: Cardiovascular;  Laterality: N/A;   RIGHT/LEFT HEART CATH AND CORONARY ANGIOGRAPHY N/A 09/03/2019   Procedure: RIGHT/LEFT HEART CATH AND CORONARY ANGIOGRAPHY;  Surgeon: Cherrie Toribio SAUNDERS, MD;  Location: [facility]VAS[facility] CV LAB;  Service: Cardiovascular;  Laterality: N/A;    SOCIAL HISTORY: Social History   Socioeconomic  History   Marital status: Divorced    Spouse name: Not on file   Number of children: Not on file   Years of education: Not on file   Highest education level: Not on file  Occupational History   Not on file  Tobacco Use   Smoking status: Former    Current packs/day: 0.50    Average packs/day: 0.5 packs/day for 52.0 years (26.0 ttl pk-yrs)    Types: Cigarettes   Smokeless tobacco: Never  Vaping Use   Vaping status: Never Used  Substance and Sexual Activity   Alcohol use: No   Drug use: No   Sexual activity: Never  Other Topics Concern   Not on file  Social History Narrative   Not on file   Social Drivers of Health   Financial Resource Strain: Not on file  Food Insecurity: No Food Insecurity (01/15/2024)   Hunger Vital Sign    Worried About Running Out of Food in the Last Year: Never true    Ran Out of Food in the Last Year: Never true  Transportation Needs: No Transportation Needs (01/15/2024)   PRAPARE - Administrator, Civil Service (Medical): No    Lack of Transportation (  Non-Medical): No  Physical Activity: Not on file  Stress: Not on file  Social Connections: Moderately Integrated (01/15/2024)   Social Connection and Isolation Panel    Frequency of Communication with Friends and Family: Three times a week    Frequency of Social Gatherings with Friends and Family: Three times a week    Attends Religious Services: 1 to 4 times per year    Active Member of Clubs or Organizations: Yes    Attends Banker Meetings: 1 to 4 times per year    Marital Status: Divorced  Intimate Partner Violence: Not At Risk (01/15/2024)   Humiliation, Afraid, Rape, and Kick questionnaire    Fear of Current or Ex-Partner: No    Emotionally Abused: No    Physically Abused: No    Sexually Abused: No    FAMILY HISTORY: Family History  Problem Relation Age of Onset   Colon cancer Mother        thinks it was colon cancer   Cancer Mother        not sure location     Prostate cancer Father    Dementia Sister    Colon cancer Brother    Stomach cancer Neg Hx      PHYSICAL EXAMINATION: ECOG PERFORMANCE STATUS: 4 - Bedbound  Vitals:   01/16/24 0445 01/16/24 0818  BP: 112/64 131/65  Pulse: 79 89  Resp: 18   Temp: 98 F (36.7 C)   SpO2: 100%    Filed Weights   01/13/24 1429  Weight: 142 lb (64.4 kg)    GENERAL: alert, +chronically ill-appearing +elderly SKIN: skin color, texture, turgor are normal, no rashes or significant lesions EYES: normal, conjunctiva are pink and non-injected, sclera clear OROPHARYNX: no exudate, no erythema and lips, buccal mucosa, and tongue normal  NECK: supple, thyroid  normal size, non-tender, without nodularity LYMPH: no palpable lymphadenopathy in the cervical, axillary or inguinal LUNGS: clear to auscultation and percussion with normal breathing effort HEART: regular rate & rhythm and no murmurs and no lower extremity edema ABDOMEN: abdomen soft, non-tender and normal bowel sounds MUSCULOSKELETAL: no cyanosis of digits and no clubbing  PSYCH: alert & oriented x 3 with fluent speech NEURO: no focal motor/sensory deficits   ALLERGIES:  has no known allergies.  MEDICATIONS:  Current Facility-Administered Medications  Medication Dose Route Frequency Provider Last Rate Last Admin   acetaminophen  (TYLENOL ) tablet 650 mg  650 mg Oral Q6H PRN Adefeso, Oladapo, DO       Or   acetaminophen  (TYLENOL ) suppository 650 mg  650 mg Rectal Q6H PRN Adefeso, Oladapo, DO       apixaban  (ELIQUIS ) tablet 5 mg  5 mg Oral BID Gherghe, Costin M, MD   5 mg at 01/15/24 2138   atorvastatin  (LIPITOR ) tablet 80 mg  80 mg Oral Daily Adefeso, Oladapo, DO   80 mg at 01/15/24 1048   carvedilol  (COREG ) tablet 3.125 mg  3.125 mg Oral Q breakfast Adefeso, Oladapo, DO   3.125 mg at 01/16/24 0818   dexamethasone  (DECADRON ) tablet 4 mg  4 mg Oral Q8H Gherghe, Costin M, MD   4 mg at 01/16/24 0505   feeding supplement (ENSURE PLUS HIGH PROTEIN)  liquid 237 mL  237 mL Oral TID BM Tat, Alm, MD   237 mL at 01/15/24 1354   hydrALAZINE  (APRESOLINE ) injection 10 mg  10 mg Intravenous Q4H PRN Amin, Ankit C, MD       insulin  aspart (novoLOG ) injection 0-9 Units  0-9 Units Subcutaneous  TID WC Evonnie Lenis, MD   1 Units at 01/16/24 9193   ipratropium-albuterol  (DUONEB) 0.5-2.5 (3) MG/3ML nebulizer solution 3 mL  3 mL Nebulization Q4H PRN Amin, Ankit C, MD   3 mL at 01/16/24 0810   levothyroxine  (SYNTHROID ) tablet 50 mcg  50 mcg Oral QAC breakfast Adefeso, Oladapo, DO   50 mcg at 01/16/24 0505   metoprolol  tartrate (LOPRESSOR ) injection 5 mg  5 mg Intravenous Q4H PRN Amin, Ankit C, MD       morphine  (PF) 2 MG/ML injection 2 mg  2 mg Intravenous Q4H PRN Adefeso, Oladapo, DO   2 mg at 01/16/24 0505   multivitamin with minerals tablet 1 tablet  1 tablet Oral Daily Tat, David, MD   1 tablet at 01/15/24 1048   Oral care mouth rinse  15 mL Mouth Rinse PRN Trixie Nilda HERO, MD       prochlorperazine  (COMPAZINE ) injection 10 mg  10 mg Intravenous Q6H PRN Adefeso, Oladapo, DO       senna-docusate (Senokot-S) tablet 1 tablet  1 tablet Oral QHS PRN Amin, Ankit C, MD         LABORATORY DATA:  I have reviewed the data as listed Lab Results  Component Value Date   WBC 15.2 (H) 01/15/2024   HGB 9.0 (L) 01/15/2024   HCT 28.4 (L) 01/15/2024   MCV 81.8 01/15/2024   PLT 276 01/15/2024   Recent Labs    01/13/24 1505 01/14/24 0232 01/15/24 0405  NA 139 138 135  K 4.0 4.2 4.6  CL 98 98 100  CO2 30 30 25   GLUCOSE 133* 119* 134*  BUN 20 21 29*  CREATININE 1.15 1.17 1.22  CALCIUM  11.9* 11.6* 10.5*  GFRNONAA >60 >60 60*  PROT 7.2 6.7 6.0*  ALBUMIN 3.5 3.3* 2.3*  AST 26 25 22   ALT 23 19 24   ALKPHOS 111 99 72  BILITOT 0.5 0.5 0.6    RADIOGRAPHIC STUDIES: I have personally reviewed the radiological images as listed and agreed with the findings in the report. MR Lumbar Spine W Wo Contrast Result Date: 01/13/2024 EXAM: MRI LUMBAR SPINE 01/13/2024  06:58:11 PM TECHNIQUE: Multiplanar multisequence MRI of the lumbar spine was performed with and without the administration of intravenous contrast. 6 mL of gadobutrol  (GADAVIST ) 1 MMOL/ML injection was administered. COMPARISON: PET CT 01/09/2024. CT abdomen and pelvis 01/01/2024. CLINICAL HISTORY: Myelopathy, acute, lumbar spine. FINDINGS: BONES AND ALIGNMENT: 5 lumbar type vertebrae. Normal alignment. Multiple T1 hypointense, enhancing lesions consistent with previously shown metastases. Numerous small lesions are scattered throughout the vertebral bodies and posterior elements of the lumbar spine as well as a larger, 2 cm lesion in the L1 vertebral body. Preserved lumbar vertebral body heights. 4 cm destructive lesion involving the anterior sacrum at S1 on the right with extraosseous tumor potentially affecting the extraforaminal right L5 nerve. Bilateral iliac lesions. Mild multilevel neural foraminal stenosis. SPINAL CORD: The conus medullaris terminates at L1-L2 and is normal in signal. No evidence of epidural tumor in the lumbar spine. SOFT TISSUES: Status post aortobifemoral bypass. DISC LEVELS: Mild diffuse lumbar spondylosis and moderate facet hypertrophy without significant spinal stenosis. Mild multilevel neural foraminal stenosis. IMPRESSION: 1. Known widespread osseous metastases including a destructive 4 cm S1 lesion. 2. No evidence of epidural tumor or spinal stenosis in the lumbar spine. Electronically signed by: Dasie Hamburg MD 01/13/2024 08:00 PM EST RP Workstation: [id number]   MR THORACIC SPINE W WO CONTRAST Result Date: 01/13/2024 EXAM: MRI THORACIC SPINE WITH AND  WITHOUT INTRAVENOUS CONTRAST 01/13/2024 06:58:11 PM TECHNIQUE: Multiplanar multisequence MRI of the thoracic spine was performed with and without the administration of intravenous contrast. COMPARISON: PET CT 01/09/2024. CTA chest and CT abdomen and pelvis 01/01/2024. CLINICAL HISTORY: Mid-back pain, neuro deficit; known T11 mass.  FINDINGS: BONES AND ALIGNMENT: Normal alignment. Multiple T1 hypointense, enhancing lesions consistent with previously shown metastases, including involvement of the T6 and T9 to T11 vertebral bodies as well as destructive lesions of the posterior right 3rd and 8th ribs. The T9 lesion extends into the right pedicle, and the T11 lesion involves the primarily left sided posterior elements. Pathologic compression fractures with 10% vertebral body height loss at T9 and 45% central vertebral body height loss at T11. Right sided ventral and lateral epidural tumor at T9 results in mild spinal stenosis and encroaches upon the medial aspect of the right T9-T10 neural foramen. There is a larger amount of ventral and lateral epidural tumor bilaterally at T11 which extends superiorly to T10 and results in moderate spinal stenosis without cord compression. Tumor also encroaches upon the left sided neural foramina at T11-T12 greater than T10-T11. SPINAL CORD: Normal spinal cord signal. SOFT TISSUES: Known right upper lobe lung mass. DEGENERATIVE CHANGES: Mild thoracic spondylosis without significant degenerative spinal stenosis. LUNGS: IMPRESSION: 1. Known widespread osseous metastases. 2. Pathologic compression fractures with 10% height loss at T9 and 45% height loss at T11. 3. Epidural tumor at T11 resulting in moderate spinal stenosis. 4. Smaller volume epidural tumor at T9 resulting in mild spinal stenosis. Electronically signed by: Dasie Hamburg MD 01/13/2024 07:50 PM EST RP Workstation: [id number]   DG Chest 1 View Result Date: 01/13/2024 CLINICAL DATA:  Weakness, immobility, recent falls, pain to toes with possible infection. EXAM: DG CHEST 1V COMPARISON:  January 01, 2024 FINDINGS: There is stable dual lead AICD positioning. The heart size and mediastinal contours are within normal limits. Mild right upper lobe scarring and/or atelectasis is seen. Adjacent ill-defined right upper lobe opacities are noted which  correspond to right upper lobe suprahilar and pleural based mass is seen on recent chest CT (January 01, 2024). No pleural effusion or pneumothorax is identified. Cortical destruction of the anterior aspect of the second right rib is seen. IMPRESSION: 1. Mild right upper lobe scarring and/or atelectasis. 2. Right upper lobe suprahilar and pleural based mass lesions, as described above. 3. Cortical destruction of the anterior aspect of the second right rib. Electronically Signed   By: Suzen Dials M.D.   On: 01/13/2024 16:22   NM PET Image Initial (PI) Skull Base To Thigh Result Date: 01/12/2024 CLINICAL DATA:  Initial treatment strategy for right upper lobe lung mass with evidence of metastatic disease prior CT. EXAM: NUCLEAR MEDICINE PET SKULL BASE TO THIGH TECHNIQUE: 7.32 mCi F-18 FDG was injected intravenously. Full-ring PET imaging was performed from the skull base to thigh after the radiotracer. CT data was obtained and used for attenuation correction and anatomic localization. Fasting blood glucose: 107 mg/dl COMPARISON:  CT of the chest, abdomen and pelvis 12/22/2023 and 01/01/2024 FINDINGS: Mediastinal blood pool activity: SUV max 1.7 NECK: No hypermetabolic cervical lymph nodes are identified. No suspicious activity identified within the pharyngeal mucosal space. Incidental CT findings: Bilateral carotid atherosclerosis. CHEST: The dominant right perihilar upper lobe mass is hypermetabolic with an SUV max 6.6. This mass measures 3.5 x 2.9 cm on image 25/205. No hypermetabolic pulmonary activity or suspicious nodularity in the left lung. There are hypermetabolic mediastinal lymph nodes, including a subcarinal node measuring  1.9 cm short axis (SUV max 5.5). Incidental CT findings: Left subclavian pacemaker leads extend into the right atrium and right ventricle. Atherosclerosis of the aorta, great vessels and coronary arteries. Mild centrilobular emphysema present ABDOMEN/PELVIS: There is no  hypermetabolic activity within the liver, adrenal glands, spleen or pancreas. There is no hypermetabolic nodal activity in the abdomen or pelvis. Previously demonstrated peritoneal nodularity anteriorly in the left upper quadrant is hypermetabolic. Largest component measures 2.2 x 1.4 cm on image 81/2 and has an SUV max of 6.2. No significant ascites. Incidental CT findings: Diffuse gaseous distension of the bowel again noted without evidence of obstruction or perforation. Aortic and branch vessel atherosclerosis post aortobifemoral bypass. Moderate prostatomegaly without focal hypermetabolic activity. SKELETON: As seen on prior imaging, there is widespread osseous metastatic disease with associated hypermetabolic activity. Representative lesions include a lytic lesion with a soft tissue component in the anterior aspect of the right 2nd rib, measuring 4.0 x 2.2 cm image 44/202 (SUV max 6.2), a lesion involving the posterior aspect the right 3rd rib, measuring 4.2 cm on image 36/202 (SUV max 8.0), and a large expansile destructive mass involving the left ischium, measuring 6.7 x 5.5 cm on image 144/202 (SUV max 8.4). There are multiple other lesions, including metastases within the T9 and T11 vertebral bodies. The T11 lesion is associated with anterior epidural tumor which may place the patient at risk for cord compression. Appearance is similar to previous CT. Incidental CT findings: none IMPRESSION: 1. The dominant right upper lobe lung mass is hypermetabolic, consistent with primary bronchogenic carcinoma. 2. Hypermetabolic mediastinal adenopathy consistent with metastatic disease. 3. Widespread osseous metastatic disease with multiple lesions as described. The T11 lesion is associated with anterior epidural tumor which may place the patient at risk for cord compression. 4. Hypermetabolic peritoneal nodularity in the left upper quadrant consistent with metastatic disease. 5. Aortic Atherosclerosis (ICD10-I70.0)  and Emphysema (ICD10-J43.9). Electronically Signed   By: Elsie Perone M.D.   On: 01/12/2024 10:46   ECHOCARDIOGRAM COMPLETE Result Date: 01/02/2024    ECHOCARDIOGRAM REPORT   Patient Name:   REVIN CORKER Date of Exam: 01/02/2024 Medical Rec #:  [id number]       Height:       68.0 in Accession #:    [id number]      Weight:       146.0 lb Date of Birth:  1945/11/22      BSA:          1.788 m Patient Age:    78 years        BP:           114/67 mmHg Patient Gender: M               HR:           95 bpm. Exam Location:  Inpatient Procedure: 2D Echo, Cardiac Doppler and Color Doppler (Both Spectral and Color            Flow Doppler were utilized during procedure). Indications:    Chest Pain R07.9  History:        Patient has prior history of Echocardiogram examinations, most                 recent 08/30/2019. CHF, CAD, Pacemaker, COPD, Arrythmias:PVC and                 NSVT; Risk Factors:Hyperlipidemia, Diabetes and Hypertension.  Sonographer:    BERNARDA ROCKS Referring Phys: [id number]  THOMAS IMPRESSIONS  1. Left ventricular ejection fraction, by estimation, is 60 to 65%. The left ventricle has normal function. Left ventricular endocardial border not optimally defined to evaluate regional wall motion. Left ventricular diastolic parameters are indeterminate.  2. Right ventricular systolic function is normal. The right ventricular size is normal.  3. The mitral valve is abnormal. Mild mitral valve regurgitation.  4. The aortic valve was not well visualized. Aortic valve regurgitation is not visualized.  5. The inferior vena cava is dilated in size with <50% respiratory variability, suggesting right atrial pressure of 15 mmHg. Conclusion(s)/Recommendation(s): Technically very limited study due to poor sound wave transmission. Many structures not seen well. FINDINGS  Left Ventricle: Left ventricular ejection fraction, by estimation, is 60 to 65%. The left ventricle has normal function. Left ventricular  endocardial border not optimally defined to evaluate regional wall motion. The left ventricular internal cavity size was normal in size. Suboptimal image quality limits for assessment of left ventricular hypertrophy. Left ventricular diastolic parameters are indeterminate. Right Ventricle: The right ventricular size is normal. No increase in right ventricular wall thickness. Right ventricular systolic function is normal. Left Atrium: Left atrial size was normal in size. Right Atrium: Right atrial size was normal in size. Pericardium: There is no evidence of pericardial effusion. Mitral Valve: The mitral valve is abnormal. There is mild thickening of the mitral valve leaflet(s). Mild mitral valve regurgitation. MV peak gradient, 2.9 mmHg. The mean mitral valve gradient is 1.0 mmHg. Tricuspid Valve: The tricuspid valve is not well visualized. Tricuspid valve regurgitation is not demonstrated. Aortic Valve: The aortic valve was not well visualized. Aortic valve regurgitation is not visualized. Aortic valve mean gradient measures 1.0 mmHg. Aortic valve peak gradient measures 1.6 mmHg. Aortic valve area, by VTI measures 4.13 cm. Pulmonic Valve: The pulmonic valve was not well visualized. Pulmonic valve regurgitation is not visualized. Aorta: The aortic root is normal in size and structure. Venous: The inferior vena cava is dilated in size with less than 50% respiratory variability, suggesting right atrial pressure of 15 mmHg. IAS/Shunts: The interatrial septum was not well visualized. Additional Comments: A device lead is visualized.  LEFT VENTRICLE PLAX 2D LVIDd:         4.80 cm      Diastology LVIDs:         3.20 cm      LV e' medial:    5.98 cm/s LV PW:         0.90 cm      LV E/e' medial:  15.6 LV IVS:        1.00 cm      LV e' lateral:   13.20 cm/s LVOT diam:     2.10 cm      LV E/e' lateral: 7.1 LV SV:         40 LV SV Index:   22 LVOT Area:     3.46 cm  LV Volumes (MOD) LV vol d, MOD A2C: 99.7 ml LV vol d, MOD  A4C: 159.0 ml LV vol s, MOD A2C: 37.6 ml LV vol s, MOD A4C: 54.4 ml LV SV MOD A2C:     62.1 ml LV SV MOD A4C:     159.0 ml LV SV MOD BP:      80.6 ml RIGHT VENTRICLE             IVC RV Basal diam:  3.50 cm     IVC diam: 2.10 cm RV S prime:  11.00 cm/s TAPSE (M-mode): 2.5 cm LEFT ATRIUM             Index        RIGHT ATRIUM           Index LA diam:        3.70 cm 2.07 cm/m   RA Area:     15.10 cm LA Vol (A2C):   26.9 ml 15.05 ml/m  RA Volume:   38.70 ml  21.65 ml/m LA Vol (A4C):   40.8 ml 22.82 ml/m LA Biplane Vol: 36.2 ml 20.25 ml/m  AORTIC VALVE                    PULMONIC VALVE AV Area (Vmax):    3.55 cm     PV Vmax:       0.68 m/s AV Area (Vmean):   3.30 cm     PV Peak grad:  1.8 mmHg AV Area (VTI):     4.13 cm AV Vmax:           64.10 cm/s AV Vmean:          41.400 cm/s AV VTI:            0.097 m AV Peak Grad:      1.6 mmHg AV Mean Grad:      1.0 mmHg LVOT Vmax:         65.70 cm/s LVOT Vmean:        39.500 cm/s LVOT VTI:          0.116 m LVOT/AV VTI ratio: 1.19  AORTA Ao Root diam: 3.40 cm Ao Asc diam:  3.50 cm MITRAL VALVE MV Area (PHT): 4.71 cm    SHUNTS MV Area VTI:   2.59 cm    Systemic VTI:  0.12 m MV Peak grad:  2.9 mmHg    Systemic Diam: 2.10 cm MV Mean grad:  1.0 mmHg MV Vmax:       0.84 m/s MV Vmean:      43.0 cm/s MV Decel Time: 161 msec MV E velocity: 93.30 cm/s MV A velocity: 46.70 cm/s MV E/A ratio:  2.00 Toribio Fuel MD Electronically signed by Toribio Fuel MD Signature Date/Time: 01/02/2024/11:52:55 PM    Final    CT ABDOMEN PELVIS W CONTRAST Result Date: 01/01/2024 EXAM: CT ABDOMEN AND PELVIS WITH CONTRAST 01/01/2024 07:13:00 PM TECHNIQUE: CT of the abdomen and pelvis was performed with the administration of 50 mL of iohexol  (OMNIPAQUE ) 350 MG/ML injection. Multiplanar reformatted images are provided for review. Automated exposure control, iterative reconstruction, and/or weight-based adjustment of the mA/kV was utilized to reduce the radiation dose to as low as reasonably  achievable. COMPARISON: 12/22/2023 CLINICAL HISTORY: Bowel obstruction suspected. FINDINGS: LOWER CHEST: See chest CT report today. LIVER: The liver is unremarkable. GALLBLADDER AND BILE DUCTS: Gallbladder is unremarkable. No biliary ductal dilatation. SPLEEN: No acute abnormality. PANCREAS: No acute abnormality. ADRENAL GLANDS: No acute abnormality. KIDNEYS, URETERS AND BLADDER: No stones in the kidneys or ureters. No hydronephrosis. No perinephric or periureteral stranding. Urinary bladder is unremarkable. GI AND BOWEL: Gaseous distention of the bowel involves both large and small bowel; however, distal small bowel loops are decompressed. While I favor this most likely reflects ileus, it is difficult to completely exclude distal small bowel obstruction. Moderate stool burden throughout the colon. Normal appendix. PERITONEUM AND RETROPERITONEUM: No ascites. No free air. VASCULATURE: Aorta is normal in caliber. Changes of prior aortobifemoral bypass. LYMPH NODES: No lymphadenopathy. REPRODUCTIVE ORGANS: Prostate  enlargement. 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 No focal soft tissue abnormality. IMPRESSION: 1. Gaseous distention of bowel involving both large and small bowel with decompressed distal small bowel loops, most likely ileus; distal small bowel obstruction cannot be completely excluded. 2. 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 Electronically signed by: Franky Crease MD 01/01/2024 07:31 PM EST RP Workstation: [id number]   CT Angio Chest PE W and/or Wo Contrast Result Date: 01/01/2024 EXAM: CTA of the Chest without and with contrast for PE 01/01/2024 07:13:00 PM TECHNIQUE: CTA of the chest was performed without and with the administration of 50 mL of iohexol  (OMNIPAQUE ) 350 MG/ML injection. Multiplanar reformatted images are provided for review. MIP images are  provided for review. Automated exposure control, iterative reconstruction, and/or weight based adjustment of the mA/kV was utilized to reduce the radiation dose to as low as reasonably achievable. COMPARISON: 12/22/2023 CLINICAL HISTORY: Pulmonary embolism (PE) suspected, high prob. FINDINGS: PULMONARY ARTERIES: Pulmonary arteries are adequately opacified for evaluation. No pulmonary embolism. Main pulmonary artery is normal in caliber. MEDIASTINUM: The heart and pericardium demonstrate no acute abnormality. There is no acute abnormality of the thoracic aorta. Right hilar and mediastinal adenopathy again noted, stable. LYMPH NODES: Right hilar and mediastinal adenopathy again noted, stable. No axillary lymphadenopathy. LUNGS AND PLEURA: Right upper lobe mass measures 3 x 3 cm on image 58. No focal consolidation or pulmonary edema. No pleural effusion or pneumothorax. UPPER ABDOMEN: Limited images of the upper abdomen are unremarkable. SOFT TISSUES AND BONES: Numerous lytic bony metastases throughout the ribs with soft tissue masses are unchanged. Stable lytic metastases throughout the thoracic spine. IMPRESSION: 1. No evidence of pulmonary embolism. 2. Stable right upper lobe mass with unchanged right hilar and mediastinal adenopathy and diffuse lytic osseous metastases in the ribs and thoracic spine. Electronically signed by: Franky Crease MD 01/01/2024 07:26 PM EST RP Workstation: [id number]   DG Chest 2 View Result Date: 01/01/2024 EXAM: 2 VIEW(S) XRAY OF THE CHEST 01/01/2024 06:36:16 PM COMPARISON: None available. CLINICAL HISTORY: chest pain FINDINGS: LINES, TUBES AND DEVICES: Left chest pacemaker with leads overlying right atrium and right ventricle. LUNGS AND PLEURA: Right upper lung opacity. Right hilar asymmetric density. Ovoid pleural-based opacity at the right apex corresponding to rib lesion on CT. No pulmonary edema. No pleural effusion. No pneumothorax. HEART AND MEDIASTINUM: No acute abnormality of  the cardiac an silhouettes. BONES AND SOFT TISSUES: Known right second and third rib lesions. IMPRESSION: 1. Right upper lung and hilar opacity corresponding to the mass recently demonstrated on CT. 2. Right second and third rib lesions corresponding to metastatic lesions seen on CT Electronically signed by: Luke Bun MD 01/01/2024 06:56 PM EST RP Workstation: [id number]   CUP PACEART INCLINIC DEVICE CHECK Result Date: 01/01/2024 in-clinic _dual__ chamber ICD check. Presenting Rhythm: AF/VS_ . Routine testing was performed. Thresholds, sensing, and impedance demonstrate stable parameters and no programming changes needed. No treated arrhythmias. Estimated longevity __7.5 years__ . Pt enrolled in remote follow-up. AFib started 11/4, overall burden low, though increased of lat A lead impedance on a gradual downtrend though appears to have leveled off, and remains WNL no VT RU  CT CHEST ABDOMEN PELVIS W CONTRAST Result Date: 12/22/2023 EXAM: CT CHEST, ABDOMEN AND PELVIS WITH CONTRAST 12/22/2023 07:48:41 PM TECHNIQUE: CT of the chest, abdomen and pelvis was performed with the administration of 100 mL of iohexol  (OMNIPAQUE ) 300 MG/ML solution. Multiplanar reformatted images  are provided for review. Automated exposure control, iterative reconstruction, and/or weight based adjustment of the mA/kV was utilized to reduce the radiation dose to as low as reasonably achievable. COMPARISON: CT pelvis earlier today CLINICAL HISTORY: Metastatic disease evaluation. Metastatic disease eval, abnormal CT, c/o left hip pain that started 2-3 weeks ago. Pt reports pain radiates into his leg and worse with movement. Pt denies falling or injury. FINDINGS: CHEST: MEDIASTINUM AND LYMPH NODES: Heart and pericardium are unremarkable. Left subclavian icd Moderate coronary atherosclerosis of the lad Thoracic aortic atherosclerosis The central airways are clear. 13 mm short axis right supraclavicular node (image 3). Additional thoracic  lymphadenopathy, including a 2.3 cm right hilar node (image 25), and a 1.7 cm short axis subcarinal node (image 29). LUNGS AND PLEURA: 3.0 x 4.0 cm posterior right upper lobe/perihilar mass (image 45) compatible with primary bronchogenic carcinoma. No pleural effusion or pneumothorax. ABDOMEN AND PELVIS: LIVER: The liver is unremarkable. GALLBLADDER AND BILE DUCTS: Gallbladder is unremarkable. No biliary ductal dilatation. SPLEEN: No acute abnormality. PANCREAS: No acute abnormality. ADRENAL GLANDS: No acute abnormality. KIDNEYS, URETERS AND BLADDER: No stones in the kidneys or ureters. No hydronephrosis. No perinephric or periureteral stranding. Urinary bladder is unremarkable. GI AND BOWEL: Stomach demonstrates no acute abnormality. There is no bowel obstruction. REPRODUCTIVE ORGANS: Mild prostatomegaly. PERITONEUM AND RETROPERITONEUM: 2.5 cm soft tissue lesion beneath the left upper anterior abdominal wall (image 58) suggesting peritoneal metastasis in this clinical context. No ascites. No free air. VASCULATURE: Atherosclerotic calcification of the abdominal aorta. Aortobifemoral bypass graft, patent. ABDOMINAL AND PELVIS LYMPH NODES: No lymphadenopathy. BONES AND SOFT TISSUES: Lytic calcification metastases involving the anterior 2nd and posterior 3rd ribs (images 11 and 13) measuring up to 4.2 cm. Additional lytic soft tissue metastasis in the right posterior 8th rib. Lytic soft tissue metastasis involving the T9 and T11 vertebral bodies (sagittal image 102) with compression of the spinal canal at T11. Lytic soft tissue metastasis involving the left anterior acetabulum (image 113). Dominant 5.9 x 7.4 cm lytic soft tissue metastasis involving the left hemipelvis, extending from the posterior acetabulum to the inferior pubic ramus (image 123). Additional scattered smaller lytic metastases in the visualized axial and appendicular skeleton. A 2.5 cm soft tissue lesion is noted beneath the left upper anterior  abdominal wall (image 58). IMPRESSION: 1. 4.0 cm posterior right upper lobe/perihilar mass, compatible with primary bronchogenic carcinoma. 2. Thoracic nodal metastases, as above. A 13 mm short axis right supraclavicular node would be amenable to percutaneous sampling, as clinically warranted. 3. Suspected isolated peritoneal metastasis in the left upper abdomen. 4. Extensive osseous metastatic disease, as described above, including a T11 lesion narrowing the spinal canal. Consider MR with contrast for further evaluation, as clinically warranted. Electronically signed by: Pinkie Pebbles MD 12/22/2023 08:06 PM EDT RP Workstation: [id number]   CT PELVIS WO CONTRAST Result Date: 12/22/2023 CLINICAL DATA:  Possible lucent lesions on x-ray. Left hip and left thigh pain. No known injury. EXAM: CT PELVIS WITHOUT CONTRAST TECHNIQUE: Multidetector CT imaging of the pelvis was performed following the standard protocol without intravenous contrast. RADIATION DOSE REDUCTION: This exam was performed according to the departmental dose-optimization program which includes automated exposure control, adjustment of the mA and/or kV according to patient size and/or use of iterative reconstruction technique. COMPARISON:  12/22/2023. FINDINGS: Urinary Tract:  No abnormality visualized. Bowel: Unremarkable visualized pelvic bowel loops. Appendix appears normal. Vascular/Lymphatic: Findings suggestive of aortobifemoral bypass grafts. No pelvic lymphadenopathy by size criteria. Reproductive:  The prostate gland is enlarged.  Other:  No ascites. Musculoskeletal: No acute fracture or dislocation is seen. Multiple lytic lesions are seen scattered throughout the bones. There are large destructive lesions with soft tissue masses is involving the sacrum on the right, acetabulum on the left, and inferior pubic ramus on the left. Evaluation of the masses is limited due to lack of IV contrast. However the largest soft tissue mass is seen in  the region the inferior pubic ramus on the left measuring 5.8 x 5.5 cm. IMPRESSION: Multiple lytic lesions scattered throughout the bones with large destructive lesionsand associated soft tissue masses involving the sacrum on the right, acetabulum on the left, and inferior pubic ramus on the left. No pathologic fracture is seen. Differential diagnosis includes multiple myeloma/plasmacytoma versus metastatic disease. Electronically Signed   By: Leita Birmingham M.D.   On: 12/22/2023 18:31   DG Femur Min 2 Views Left Result Date: 12/22/2023 EXAM: 2 VIEW(S) XRAY OF THE LEFT FEMUR 12/22/2023 05:53:00 PM COMPARISON: None available. CLINICAL HISTORY: left hip/femur pain. Per triage ;  Pt arrives with c/o left hip pain that started 2-3 weeks ago. Pt reports pain radiates into his leg and worse with movement. Pt denies falling or injury. ; Best lateral obtainable, pt had trouble staying still once in position FINDINGS: BONES AND JOINTS: Left inferior pubic ramus appears fractured and fragmented with some areas of focal lucency. The left femur appears intact. There are mild degenerative changes of the hip and knee. No joint dislocation. SOFT TISSUES: Peripheral vascular calcifications are present. IMPRESSION: 1. Fracture and fragmentation of the left inferior pubic ramus with focal lucency. 2. Mild degenerative changes of the hip and knee. Electronically signed by: Greig Pique MD 12/22/2023 06:10 PM EDT RP Workstation: [id number]   DG Pelvis 1-2 Views Result Date: 12/22/2023 EXAM: 1 or 2 VIEW(S) XRAY OF THE PELVIS 12/22/2023 04:57:18 PM COMPARISON: None available. CLINICAL HISTORY: Left hip pain. FINDINGS: BONES AND JOINTS: Questionable erosive changes and lucent lesions in the left inferior pubic ramus. No joint dislocation. SOFT TISSUES: Surgical clips overlie the bilateral inguinal regions. Peripheral vascular calcifications are present. STOMACH AND BOWEL: Diffuse gaseous distention of visualized colon.  IMPRESSION: 1. Questionable erosive changes and lucent lesions in the left inferior pubic ramus. Recommend further evaluation with CT. 2. Diffuse gaseous distention of visualized colon. Electronically signed by: Greig Pique MD 12/22/2023 05:19 PM EDT RP Workstation: [id number]     The total time spent in the appointment was 55 minutes encounter with patients including review of chart and various tests results, discussions about plan of care and coordination of care plan   All questions were answered. The patient knows to call the clinic with any problems, questions or concerns. No barriers to learning was detected.  Peter PARAS Rouson, NP 11/20/20259:36 AM

## 2024-01-16 NOTE — Progress Notes (Signed)
 Physical Therapy Treatment Patient Details Name: Peter Lambert MRN: [id number] DOB: 24-Apr-1945 Today's Date: 01/16/2024   History of Present Illness Peter Lambert is a 78 y.o. male who was brought to ED due to declining over the last four weeks, pain in both feet, back, and L hip, weakness in LEs, and increased falls. MRI lumbar spine showed known widespread osseous metastases including a destructive 4 cm S1 lesion. No evidence of epidural tumor or spinal stenosis in the lumbar spine.  MRI thoracic spine showed known widespread osseous metastasis with pathology compression fracture with 10% height loss and T9 and 45% height loss at T11.  Epidural tumor at T11 resulted in the moderate spinal stenosis.  PMH: HTN, HLD, persistent a-fib on Eliquis , CAD s/p DES to LAD in 2010, V-fib arrest in 2021 in setting of occluded RCA, tobacco abuse PAD with remote history of aortofemoral bypass grafting    PT Comments  Improved activity tolerance today. Pt ambulated 44' with RW, no loss of balance, he denied pain. Pt performed seated BLE strengthening exercises which he tolerated well.     If plan is discharge home, recommend the following: A little help with walking and/or transfers;A little help with bathing/dressing/bathroom;Assist for transportation;Help with stairs or ramp for entrance   Can travel by private vehicle        Equipment Recommendations  None recommended by PT    Recommendations for Other Services       Precautions / Restrictions Precautions Precautions: Fall;Back Recall of Precautions/Restrictions: Intact Precaution/Restrictions Comments: impaired sensation in bilat LEs; reviewed log roll technique Restrictions Weight Bearing Restrictions Per Provider Order: No     Mobility  Bed Mobility Overal bed mobility: Needs Assistance Bed Mobility: Rolling, Sidelying to Sit Rolling: Used rails, Contact guard assist Sidelying to sit: Contact guard assist       General bed  mobility comments: Verbal and manual cues for log roll technique    Transfers Overall transfer level: Needs assistance Equipment used: Rolling walker (2 wheels) Transfers: Sit to/from Stand Sit to Stand: Contact guard assist           General transfer comment: VCs hand placement, min A to control descent    Ambulation/Gait Ambulation/Gait assistance: Contact guard assist Gait Distance (Feet): 65 Feet Assistive device: Rolling walker (2 wheels) Gait Pattern/deviations: Step-through pattern, Decreased stride length Gait velocity: decr     General Gait Details: steady, no loss of balance, denied pain   Stairs             Wheelchair Mobility     Tilt Bed    Modified Rankin (Stroke Patients Only)       Balance Overall balance assessment: Needs assistance Sitting-balance support: Feet supported, Bilateral upper extremity supported Sitting balance-Leahy Scale: Fair     Standing balance support: Bilateral upper extremity supported, During functional activity, Reliant on assistive device for balance Standing balance-Leahy Scale: Poor Standing balance comment: reliant on RW                            Communication Communication Communication: No apparent difficulties  Cognition Arousal: Alert Behavior During Therapy: WFL for tasks assessed/performed   PT - Cognitive impairments: No apparent impairments                         Following commands: Intact      Cueing Cueing Techniques: Verbal cues  Exercises General Exercises -  Lower Extremity Long Arc Quad: AROM, Both, 10 reps, Seated Hip Flexion/Marching: AROM, Both, 10 reps, Seated    General Comments        Pertinent Vitals/Pain Pain Assessment Pain Assessment: No/denies pain Faces Pain Scale: No hurt    Home Living                          Prior Function            PT Goals (current goals can now be found in the care plan section) Acute Rehab PT  Goals Patient Stated Goal: get better PT Goal Formulation: With patient Time For Goal Achievement: 01/29/24 Potential to Achieve Goals: Fair Progress towards PT goals: Progressing toward goals    Frequency    Min 3X/week      PT Plan      Co-evaluation              AM-PAC PT 6 Clicks Mobility   Outcome Measure  Help needed turning from your back to your side while in a flat bed without using bedrails?: A Little Help needed moving from lying on your back to sitting on the side of a flat bed without using bedrails?: A Little Help needed moving to and from a bed to a chair (including a wheelchair)?: None Help needed standing up from a chair using your arms (e.g., wheelchair or bedside chair)?: None Help needed to walk in hospital room?: None Help needed climbing 3-5 steps with a railing? : A Little 6 Click Score: 21    End of Session Equipment Utilized During Treatment: Gait belt Activity Tolerance: Patient tolerated treatment well Patient left: in chair;with family/visitor present Nurse Communication: Mobility status PT Visit Diagnosis: Unsteadiness on feet (R26.81);Muscle weakness (generalized) (M62.81);History of falling (Z91.81)     Time: 1447-1510 PT Time Calculation (min) (ACUTE ONLY): 23 min  Charges:    $Gait Training: 8-22 mins $Therapeutic Exercise: 8-22 mins PT General Charges $$ ACUTE PT VISIT: 1 Visit                     Sylvan Delon Copp PT 01/16/2024  Acute Rehabilitation Services  Office [phone]

## 2024-01-16 NOTE — Evaluation (Signed)
 SLP Cancellation Note  Patient Details Name: ABHIMANYU CRUCES MRN: [id number] DOB: 1946/02/01   Cancelled treatment:       Reason Eval/Treat Not Completed: Other (comment) (per chart, pt at [facility] radiation oncology, will continue efforts)  Madelin POUR, MS [facility] SLP Acute Rehab Services Office [phone]   Nicolas Emmie Caldron 01/16/2024, 12:39 PM

## 2024-01-16 NOTE — Plan of Care (Signed)
  Problem: Coping: Goal: Ability to adjust to condition or change in health will improve Outcome: Progressing   Problem: Skin Integrity: Goal: Risk for impaired skin integrity will decrease Outcome: Progressing   Problem: Tissue Perfusion: Goal: Adequacy of tissue perfusion will improve Outcome: Progressing   Problem: Pain Managment: Goal: General experience of comfort will improve and/or be controlled Outcome: Progressing   Problem: Safety: Goal: Ability to remain free from injury will improve Outcome: Progressing

## 2024-01-17 ENCOUNTER — Other Ambulatory Visit: Payer: Self-pay

## 2024-01-17 ENCOUNTER — Ambulatory Visit: Admitting: Radiation Oncology

## 2024-01-17 ENCOUNTER — Ambulatory Visit

## 2024-01-17 ENCOUNTER — Inpatient Hospital Stay (HOSPITAL_COMMUNITY)

## 2024-01-17 DIAGNOSIS — R29898 Other symptoms and signs involving the musculoskeletal system: Secondary | ICD-10-CM | POA: Diagnosis not present

## 2024-01-17 DIAGNOSIS — N1831 Chronic kidney disease, stage 3a: Secondary | ICD-10-CM

## 2024-01-17 DIAGNOSIS — G893 Neoplasm related pain (acute) (chronic): Secondary | ICD-10-CM | POA: Diagnosis not present

## 2024-01-17 DIAGNOSIS — L8990 Pressure ulcer of unspecified site, unspecified stage: Secondary | ICD-10-CM | POA: Insufficient documentation

## 2024-01-17 DIAGNOSIS — C7951 Secondary malignant neoplasm of bone: Principal | ICD-10-CM

## 2024-01-17 HISTORY — PX: IR IMAGING GUIDED PORT INSERTION: IMG5740

## 2024-01-17 LAB — RAD ONC ARIA SESSION SUMMARY
Course Elapsed Days: 0
Plan Fractions Treated to Date: 1
Plan Fractions Treated to Date: 1
Plan Fractions Treated to Date: 1
Plan Prescribed Dose Per Fraction: 4 Gy
Plan Prescribed Dose Per Fraction: 4 Gy
Plan Prescribed Dose Per Fraction: 4 Gy
Plan Total Fractions Prescribed: 5
Plan Total Fractions Prescribed: 5
Plan Total Fractions Prescribed: 5
Plan Total Prescribed Dose: 20 Gy
Plan Total Prescribed Dose: 20 Gy
Plan Total Prescribed Dose: 20 Gy
Reference Point Dosage Given to Date: 4 Gy
Reference Point Dosage Given to Date: 4 Gy
Reference Point Dosage Given to Date: 4 Gy
Reference Point Session Dosage Given: 4 Gy
Reference Point Session Dosage Given: 4 Gy
Reference Point Session Dosage Given: 4 Gy
Session Number: 1

## 2024-01-17 LAB — COMPREHENSIVE METABOLIC PANEL WITH GFR
ALT: 36 U/L (ref 0–44)
AST: 32 U/L (ref 15–41)
Albumin: 3.1 g/dL — ABNORMAL LOW (ref 3.5–5.0)
Alkaline Phosphatase: 100 U/L (ref 38–126)
Anion gap: 10 (ref 5–15)
BUN: 36 mg/dL — ABNORMAL HIGH (ref 8–23)
CO2: 25 mmol/L (ref 22–32)
Calcium: 10 mg/dL (ref 8.9–10.3)
Chloride: 98 mmol/L (ref 98–111)
Creatinine, Ser: 1.25 mg/dL — ABNORMAL HIGH (ref 0.61–1.24)
GFR, Estimated: 58 mL/min — ABNORMAL LOW (ref >60)
Glucose, Bld: 205 mg/dL — ABNORMAL HIGH (ref 70–99)
Potassium: 4.9 mmol/L (ref 3.5–5.1)
Sodium: 133 mmol/L — ABNORMAL LOW (ref 135–145)
Total Bilirubin: 0.3 mg/dL (ref 0.0–1.2)
Total Protein: 6.1 g/dL — ABNORMAL LOW (ref 6.5–8.1)

## 2024-01-17 LAB — CBC
HCT: 27.6 % — ABNORMAL LOW (ref 39.0–52.0)
Hemoglobin: 8.4 g/dL — ABNORMAL LOW (ref 13.0–17.0)
MCH: 25.6 pg — ABNORMAL LOW (ref 26.0–34.0)
MCHC: 30.4 g/dL (ref 30.0–36.0)
MCV: 84.1 fL (ref 80.0–100.0)
Platelets: 258 K/uL (ref 150–400)
RBC: 3.28 MIL/uL — ABNORMAL LOW (ref 4.22–5.81)
RDW: 15.4 % (ref 11.5–15.5)
WBC: 14 K/uL — ABNORMAL HIGH (ref 4.0–10.5)
nRBC: 0 % (ref 0.0–0.2)

## 2024-01-17 LAB — GLUCOSE, CAPILLARY
Glucose-Capillary: 158 mg/dL — ABNORMAL HIGH (ref 70–99)
Glucose-Capillary: 117 mg/dL — ABNORMAL HIGH (ref 70–99)
Glucose-Capillary: 160 mg/dL — ABNORMAL HIGH (ref 70–99)
Glucose-Capillary: 238 mg/dL — ABNORMAL HIGH (ref 70–99)

## 2024-01-17 LAB — PHOSPHORUS: Phosphorus: 2.6 mg/dL (ref 2.5–4.6)

## 2024-01-17 LAB — MAGNESIUM: Magnesium: 1.8 mg/dL (ref 1.7–2.4)

## 2024-01-17 MED ORDER — MIDAZOLAM HCL (PF) 2 MG/2ML IJ SOLN
INTRAMUSCULAR | Status: AC | PRN
  Administered 2024-01-17 (×2): 1 mg via INTRAVENOUS

## 2024-01-17 MED ORDER — APIXABAN 5 MG PO TABS: 5.0000 mg | ORAL_TABLET | Freq: Two times a day (BID) | ORAL | Status: DC

## 2024-01-17 MED ORDER — LIDOCAINE-EPINEPHRINE 1 %-1:100000 IJ SOLN
20.0000 mL | Freq: Once | INTRAMUSCULAR | Status: AC
  Administered 2024-01-17: 15 mL via INTRADERMAL
  Filled 2024-01-17: qty 20

## 2024-01-17 MED ORDER — MIDAZOLAM HCL 2 MG/2ML IJ SOLN
INTRAMUSCULAR | Status: AC
  Filled 2024-01-17: qty 2

## 2024-01-17 MED ORDER — OXYCODONE HCL 5 MG PO TABS
5.0000 mg | ORAL_TABLET | ORAL | Status: DC | PRN
  Administered 2024-01-17: 5 mg via ORAL
  Filled 2024-01-17: qty 1

## 2024-01-17 MED ORDER — ACETAMINOPHEN 500 MG PO TABS
1000.0000 mg | ORAL_TABLET | Freq: Three times a day (TID) | ORAL | Status: DC
  Administered 2024-01-17 – 2024-01-19 (×6): 1000 mg via ORAL
  Filled 2024-01-17 (×6): qty 2

## 2024-01-17 MED ORDER — FENTANYL CITRATE (PF) 100 MCG/2ML IJ SOLN
INTRAMUSCULAR | Status: AC
  Filled 2024-01-17: qty 2

## 2024-01-17 MED ORDER — LIDOCAINE HCL 1 % IJ SOLN
INTRAMUSCULAR | Status: AC
Start: 2024-01-17 — End: 2024-01-17
  Filled 2024-01-17: qty 20

## 2024-01-17 MED ORDER — FENTANYL CITRATE (PF) 100 MCG/2ML IJ SOLN
INTRAMUSCULAR | Status: AC | PRN
  Administered 2024-01-17 (×2): 50 ug via INTRAVENOUS

## 2024-01-17 MED ORDER — APIXABAN 5 MG PO TABS
5.0000 mg | ORAL_TABLET | Freq: Two times a day (BID) | ORAL | Status: DC
  Administered 2024-01-17 – 2024-01-19 (×4): 5 mg via ORAL
  Filled 2024-01-17 (×4): qty 1

## 2024-01-17 NOTE — Plan of Care (Signed)
  Problem: Fluid Volume: Goal: Ability to maintain a balanced intake and output will improve Outcome: Progressing   Problem: Skin Integrity: Goal: Risk for impaired skin integrity will decrease Outcome: Progressing   Problem: Tissue Perfusion: Goal: Adequacy of tissue perfusion will improve Outcome: Progressing   Problem: Pain Managment: Goal: General experience of comfort will improve and/or be controlled Outcome: Progressing

## 2024-01-17 NOTE — Progress Notes (Addendum)
 Brief Pharmacy Note  Pharmacy consult for anticoagulation review following IR procedure. Per consult, bleeding risk LOW. Protocol to resume Eliquis  Day + 1 (next am). Next dose of Eliquis  has been moved to 11/22 morning.  Addendum 1400: D/w Dr. Jenna. Pt received this morning's dose prior to port placement. No need to hold Eliquis . Will continue with Eliquis  this evening.  Stefano MARLA Bologna, PharmD, BCPS Clinical Pharmacist 01/17/2024 12:34 PM

## 2024-01-17 NOTE — Procedures (Signed)
 Interventional Radiology Procedure Note  Procedure: Chest Port placement  Complications: None  Estimated Blood Loss: < 10 mL  Findings: Right jugular vein access for placement of a SL chest port.  Catheter tip at cavoatrial junction.  Peter Lambert Banner, MD

## 2024-01-17 NOTE — Evaluation (Signed)
 SLP Cancellation Note  Patient Details Name: Peter Lambert MRN: [id number] DOB: 1945/11/01   Cancelled treatment:       Reason Eval/Treat Not Completed: Other (comment) (pt NPO at this time; will continue efforts) Madelin POUR, MS [facility] SLP Acute Rehab Services Office [phone]   Nicolas Emmie Caldron 01/17/2024, 7:45 AM

## 2024-01-17 NOTE — Progress Notes (Signed)
 Orthopedic Tech Progress Note Patient Details:  Peter Lambert Jul 25, 1945 [id number] Upon arrival to patient's room they were about to be transported to radiology.  I was able to do a quick demonstration of how to apply the brace and explained that it was to be be worn out of bed. TLSO was left at bedside.  Ortho Devices Type of Ortho Device: Thoracolumbar corset (TLSO) Ortho Device/Splint Interventions: Ordered, Adjustment   Post Interventions Instructions Provided: Adjustment of device  [facility] E Ellanor Feuerstein 01/17/2024, 4:26 PM

## 2024-01-17 NOTE — Consult Note (Signed)
 Chief Complaint: Lung cancer; referred for port a cath placement  Referring Provider(s): Pasam,A  Supervising Physician: Jenna Hacker  Patient Status: [facility] - In-pt  History of Present Illness: Peter Lambert is an 78 y.o. male ex smoker with PMH sig for anemia, CAD with prior MI/stenting, afib, GERD, HLD,HTN, PAD with AFBG, ICD placement,DM, prior cardiac arrest 2021, CHF. 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 Pt seen by oncology this admission and wants to continue full scope of treatment. Port a cath placement now requested.   Patient is Full Code  Past Medical History:  Diagnosis Date   Acute upper GI bleed    Anemia    Anticoagulated on apixaban     Cardiac arrest with ventricular fibrillation (HCC) 2021   CHF (congestive heart failure) (HCC)    Chronic a-fib (HCC)    Coronary artery disease    a. s/p NSTEMI in 2010 with DES to proximal LAD with D1 jailed and angioplasty alone to ostium   Dyspnea    GERD (gastroesophageal reflux disease)    Hyperlipidemia    Hypertension    Mediastinal lymphadenopathy    NSTEMI (non-ST elevated myocardial infarction) (HCC) 2010   Peripheral arterial disease    S/P coronary artery stent placement    S/P ICD (internal cardiac defibrillator) procedure    Tobacco abuse    Type 2 diabetes mellitus (HCC)     Past Surgical History:  Procedure Laterality Date   CARDIAC SURGERY     COLONOSCOPY WITH PROPOFOL  N/A 01/08/2020   Procedure: COLONOSCOPY WITH PROPOFOL ;  Surgeon: Cindie Carlin POUR, DO;  Location: [facility];  Service: Endoscopy;  Laterality: N/A;   COLONOSCOPY WITH PROPOFOL  N/A 05/03/2020   Hung:entire examined colon normal   CORONARY STENT INTERVENTION N/A 09/03/2019   Procedure: CORONARY STENT INTERVENTION;  Surgeon: Verlin Lonni BIRCH, MD;  Location: [facility] INVASIVE CV LAB;  Service: Cardiovascular;  Laterality: N/A;   CORONARY  STENT PLACEMENT     ENDOBRONCHIAL ULTRASOUND Bilateral 01/07/2024   Procedure: ENDOBRONCHIAL ULTRASOUND (EBUS);  Surgeon: Malka Domino, MD;  Location: [facility] ORS;  Service: Pulmonary;  Laterality: Bilateral;   ENTEROSCOPY N/A 05/03/2020   hung:normal esophagus, normal stomach. Three non-bleeding angiodysplastic lesions in the duodenum. Treated with a monopolar probe. A few non-bleeding angiodysplastic lesions in the jejunum. Treated with a monopolar probe. No specimens collected.   ESOPHAGOGASTRODUODENOSCOPY (EGD) WITH PROPOFOL  N/A 01/06/2020   Procedure: ESOPHAGOGASTRODUODENOSCOPY (EGD) WITH PROPOFOL ;  Surgeon: Golda Claudis PENNER, MD;  Location: [facility];  Service: Endoscopy;  Laterality: N/A;   HOT HEMOSTASIS N/A 05/03/2020   Procedure: HOT HEMOSTASIS (ARGON PLASMA COAGULATION/BICAP);  Surgeon: Rollin Dover, MD;  Location: [facility] ENDOSCOPY;  Service: Gastroenterology;  Laterality: N/A;   ICD IMPLANT N/A 09/04/2019   Procedure: ICD IMPLANT;  Surgeon: Waddell Danelle ORN, MD;  Location: [facility] INVASIVE CV LAB;  Service: Cardiovascular;  Laterality: N/A;   RIGHT/LEFT HEART CATH AND CORONARY ANGIOGRAPHY N/A 09/03/2019   Procedure: RIGHT/LEFT HEART CATH AND CORONARY ANGIOGRAPHY;  Surgeon: Cherrie Toribio SAUNDERS, MD;  Location: [facility] INVASIVE CV LAB;  Service: Cardiovascular;  Laterality: N/A;    Allergies: Patient has no known allergies.  Medications: Prior to Admission medications   Medication Sig Start Date End Date Taking? Authorizing Provider  apixaban  (ELIQUIS ) 5 MG TABS tablet Take 1 tablet (5 mg total) by mouth 2 (two) times daily. Hold Eliquis  on 01/05/2024 for bronchoscopy 01/03/24  Yes Odell Celinda Balo, MD  atorvastatin  (LIPITOR ) 80 MG tablet Take 80 mg by mouth daily.   Yes [provider]  carvedilol  (COREG ) 3.125 MG tablet Take 1 tablet (3.125 mg total) by mouth daily with breakfast. 05/04/20 01/01/25 Yes Hall, Carole N, DO  fentaNYL  (DURAGESIC ) 25 MCG/HR Place 1 patch onto the skin  every 3 (three) days.   Yes [provider]  ferrous sulfate  (FERROUSUL) 325 (65 FE) MG tablet Take 1 tablet (325 mg total) by mouth daily with breakfast. 01/19/20  Yes Rehman, Claudis PENNER, MD  furosemide  (LASIX ) 20 MG tablet Take 20 mg by mouth daily.   Yes [provider]  levothyroxine  (SYNTHROID ) 50 MCG tablet Take 50 mcg by mouth daily before breakfast.   Yes [provider]  losartan  (COZAAR ) 25 MG tablet Take 25 mg by mouth daily. 01/14/21  Yes [provider]  nitroGLYCERIN  (NITROSTAT ) 0.4 MG SL tablet Place 1 tablet (0.4 mg total) under the tongue every 5 (five) minutes as needed for chest pain. Patient must keep upcoming appointment for further refills 07/20/21  Yes Court Dorn PARAS, MD  omeprazole  (PRILOSEC) 20 MG capsule Take 20 mg by mouth daily after lunch.   Yes [provider]  potassium chloride  SA (KLOR-CON ) 20 MEQ tablet Take 20 mEq by mouth daily after lunch.   Yes [provider]  spironolactone  (ALDACTONE ) 50 MG tablet Take 50 mg by mouth daily. 02/11/21  Yes [provider]  traMADol  (ULTRAM ) 50 MG tablet Take 1 tablet every 6-8 hours as needed for pain Patient taking differently: Take 50 mg by mouth every 6 (six) hours as needed for moderate pain (pain score 4-6) or severe pain (pain score 7-10). 01/08/24  Yes Burns, Delon BRAVO, NP  [employer] GENTLE LAXATIVE 5 MG EC tablet TAKE 1 TABLET BY MOUTH DAILY AS NEEDED FOR MODERATE CONSTIPATION. TAKE TWO DAYS BEFORE PROCEDURE Patient taking differently: Take 5 mg by mouth daily as needed for mild constipation or moderate constipation. 05/30/20   Eartha Angelia Sieving, MD  potassium chloride  (KLOR-CON ) 20 MEQ packet TAKE 1 TABLET BY MOUTH EVERY DAY 12/24/12 07/14/13  Court Dorn PARAS, MD     Family History  Problem Relation Age of Onset   Colon cancer Mother        thinks it was colon cancer   Cancer Mother        not sure location    Prostate cancer Father    Dementia  Sister    Colon cancer Brother    Stomach cancer Neg Hx     Social History   Socioeconomic History   Marital status: Divorced    Spouse name: Not on file   Number of children: Not on file   Years of education: Not on file   Highest education level: Not on file  Occupational History   Not on file  Tobacco Use   Smoking status: Former    Current packs/day: 0.50    Average packs/day: 0.5 packs/day for 52.0 years (26.0 ttl pk-yrs)    Types: Cigarettes   Smokeless tobacco: Never  Vaping Use   Vaping status: Never Used  Substance and Sexual Activity   Alcohol use: No   Drug use: No   Sexual activity: Never  Other Topics Concern   Not on file  Social History Narrative   Not on file   Social Drivers of Health   Financial Resource Strain: Not on file  Food Insecurity: No Food Insecurity (01/15/2024)   Hunger Vital Sign  Worried About Programme Researcher, Broadcasting/film/video in the Last Year: Never true    Ran Out of Food in the Last Year: Never true  Transportation Needs: No Transportation Needs (01/15/2024)   PRAPARE - Administrator, Civil Service (Medical): No    Lack of Transportation (Non-Medical): No  Physical Activity: Not on file  Stress: Not on file  Social Connections: Moderately Integrated (01/15/2024)   Social Connection and Isolation Panel    Frequency of Communication with Friends and Family: Three times a week    Frequency of Social Gatherings with Friends and Family: Three times a week    Attends Religious Services: 1 to 4 times per year    Active Member of Clubs or Organizations: Yes    Attends Banker Meetings: 1 to 4 times per year    Marital Status: Divorced       Review of Systems denies fever,HA,CP, abd pain, N/V or bleeding; he does have occ cough, some dyspnea with exertion, back/hip pain  Vital Signs: BP 109/61 (BP Location: Right Arm)   Pulse 70   Temp 97.9 F (36.6 C)   Resp 18   Ht 5' 8 (1.727 m)   Wt 142 lb (64.4 kg)   SpO2  100%   BMI 21.59 kg/m   Advance Care Plan: no documents on file    Physical Exam: awake/alert; chest- distant BS bilat; left chest wall ICD in place; heart- RRR; abd- soft,+BS,NT; no LE edema  Imaging: MR Lumbar Spine W Wo Contrast Result Date: 01/13/2024 EXAM: MRI LUMBAR SPINE 01/13/2024 06:58:11 PM TECHNIQUE: Multiplanar multisequence MRI of the lumbar spine was performed with and without the administration of intravenous contrast. 6 mL of gadobutrol  (GADAVIST ) 1 MMOL/ML injection was administered. COMPARISON: PET CT 01/09/2024. CT abdomen and pelvis 01/01/2024. CLINICAL HISTORY: Myelopathy, acute, lumbar spine. FINDINGS: BONES AND ALIGNMENT: 5 lumbar type vertebrae. Normal alignment. Multiple T1 hypointense, enhancing lesions consistent with previously shown metastases. Numerous small lesions are scattered throughout the vertebral bodies and posterior elements of the lumbar spine as well as a larger, 2 cm lesion in the L1 vertebral body. Preserved lumbar vertebral body heights. 4 cm destructive lesion involving the anterior sacrum at S1 on the right with extraosseous tumor potentially affecting the extraforaminal right L5 nerve. Bilateral iliac lesions. Mild multilevel neural foraminal stenosis. SPINAL CORD: The conus medullaris terminates at L1-L2 and is normal in signal. No evidence of epidural tumor in the lumbar spine. SOFT TISSUES: Status post aortobifemoral bypass. DISC LEVELS: Mild diffuse lumbar spondylosis and moderate facet hypertrophy without significant spinal stenosis. Mild multilevel neural foraminal stenosis. IMPRESSION: 1. Known widespread osseous metastases including a destructive 4 cm S1 lesion. 2. No evidence of epidural tumor or spinal stenosis in the lumbar spine. Electronically signed by: Dasie Hamburg MD 01/13/2024 08:00 PM EST RP Workstation: [id number]   MR THORACIC SPINE W WO CONTRAST Result Date: 01/13/2024 EXAM: MRI THORACIC SPINE WITH AND WITHOUT INTRAVENOUS CONTRAST  01/13/2024 06:58:11 PM TECHNIQUE: Multiplanar multisequence MRI of the thoracic spine was performed with and without the administration of intravenous contrast. COMPARISON: PET CT 01/09/2024. CTA chest and CT abdomen and pelvis 01/01/2024. CLINICAL HISTORY: Mid-back pain, neuro deficit; known T11 mass. FINDINGS: BONES AND ALIGNMENT: Normal alignment. Multiple T1 hypointense, enhancing lesions consistent with previously shown metastases, including involvement of the T6 and T9 to T11 vertebral bodies as well as destructive lesions of the posterior right 3rd and 8th ribs. The T9 lesion extends into the right pedicle, and the  T11 lesion involves the primarily left sided posterior elements. Pathologic compression fractures with 10% vertebral body height loss at T9 and 45% central vertebral body height loss at T11. Right sided ventral and lateral epidural tumor at T9 results in mild spinal stenosis and encroaches upon the medial aspect of the right T9-T10 neural foramen. There is a larger amount of ventral and lateral epidural tumor bilaterally at T11 which extends superiorly to T10 and results in moderate spinal stenosis without cord compression. Tumor also encroaches upon the left sided neural foramina at T11-T12 greater than T10-T11. SPINAL CORD: Normal spinal cord signal. SOFT TISSUES: Known right upper lobe lung mass. DEGENERATIVE CHANGES: Mild thoracic spondylosis without significant degenerative spinal stenosis. LUNGS: IMPRESSION: 1. Known widespread osseous metastases. 2. Pathologic compression fractures with 10% height loss at T9 and 45% height loss at T11. 3. Epidural tumor at T11 resulting in moderate spinal stenosis. 4. Smaller volume epidural tumor at T9 resulting in mild spinal stenosis. Electronically signed by: Dasie Hamburg MD 01/13/2024 07:50 PM EST RP Workstation: [id number]   DG Chest 1 View Result Date: 01/13/2024 CLINICAL DATA:  Weakness, immobility, recent falls, pain to toes with possible  infection. EXAM: DG CHEST 1V COMPARISON:  January 01, 2024 FINDINGS: There is stable dual lead AICD positioning. The heart size and mediastinal contours are within normal limits. Mild right upper lobe scarring and/or atelectasis is seen. Adjacent ill-defined right upper lobe opacities are noted which correspond to right upper lobe suprahilar and pleural based mass is seen on recent chest CT (January 01, 2024). No pleural effusion or pneumothorax is identified. Cortical destruction of the anterior aspect of the second right rib is seen. IMPRESSION: 1. Mild right upper lobe scarring and/or atelectasis. 2. Right upper lobe suprahilar and pleural based mass lesions, as described above. 3. Cortical destruction of the anterior aspect of the second right rib. Electronically Signed   By: Suzen Dials M.D.   On: 01/13/2024 16:22   NM PET Image Initial (PI) Skull Base To Thigh Result Date: 01/12/2024 CLINICAL DATA:  Initial treatment strategy for right upper lobe lung mass with evidence of metastatic disease prior CT. EXAM: NUCLEAR MEDICINE PET SKULL BASE TO THIGH TECHNIQUE: 7.32 mCi F-18 FDG was injected intravenously. Full-ring PET imaging was performed from the skull base to thigh after the radiotracer. CT data was obtained and used for attenuation correction and anatomic localization. Fasting blood glucose: 107 mg/dl COMPARISON:  CT of the chest, abdomen and pelvis 12/22/2023 and 01/01/2024 FINDINGS: Mediastinal blood pool activity: SUV max 1.7 NECK: No hypermetabolic cervical lymph nodes are identified. No suspicious activity identified within the pharyngeal mucosal space. Incidental CT findings: Bilateral carotid atherosclerosis. CHEST: The dominant right perihilar upper lobe mass is hypermetabolic with an SUV max 6.6. This mass measures 3.5 x 2.9 cm on image 25/205. No hypermetabolic pulmonary activity or suspicious nodularity in the left lung. There are hypermetabolic mediastinal lymph nodes, including a  subcarinal node measuring 1.9 cm short axis (SUV max 5.5). Incidental CT findings: Left subclavian pacemaker leads extend into the right atrium and right ventricle. Atherosclerosis of the aorta, great vessels and coronary arteries. Mild centrilobular emphysema present ABDOMEN/PELVIS: There is no hypermetabolic activity within the liver, adrenal glands, spleen or pancreas. There is no hypermetabolic nodal activity in the abdomen or pelvis. Previously demonstrated peritoneal nodularity anteriorly in the left upper quadrant is hypermetabolic. Largest component measures 2.2 x 1.4 cm on image 81/2 and has an SUV max of 6.2. No significant ascites. Incidental CT findings:  Diffuse gaseous distension of the bowel again noted without evidence of obstruction or perforation. Aortic and branch vessel atherosclerosis post aortobifemoral bypass. Moderate prostatomegaly without focal hypermetabolic activity. SKELETON: As seen on prior imaging, there is widespread osseous metastatic disease with associated hypermetabolic activity. Representative lesions include a lytic lesion with a soft tissue component in the anterior aspect of the right 2nd rib, measuring 4.0 x 2.2 cm image 44/202 (SUV max 6.2), a lesion involving the posterior aspect the right 3rd rib, measuring 4.2 cm on image 36/202 (SUV max 8.0), and a large expansile destructive mass involving the left ischium, measuring 6.7 x 5.5 cm on image 144/202 (SUV max 8.4). There are multiple other lesions, including metastases within the T9 and T11 vertebral bodies. The T11 lesion is associated with anterior epidural tumor which may place the patient at risk for cord compression. Appearance is similar to previous CT. Incidental CT findings: none IMPRESSION: 1. The dominant right upper lobe lung mass is hypermetabolic, consistent with primary bronchogenic carcinoma. 2. Hypermetabolic mediastinal adenopathy consistent with metastatic disease. 3. Widespread osseous metastatic disease  with multiple lesions as described. The T11 lesion is associated with anterior epidural tumor which may place the patient at risk for cord compression. 4. Hypermetabolic peritoneal nodularity in the left upper quadrant consistent with metastatic disease. 5. Aortic Atherosclerosis (ICD10-I70.0) and Emphysema (ICD10-J43.9). Electronically Signed   By: Elsie Perone M.D.   On: 01/12/2024 10:46   ECHOCARDIOGRAM COMPLETE Result Date: 01/02/2024    ECHOCARDIOGRAM REPORT   Patient Name:   ADRIENE PADULA Date of Exam: 01/02/2024 Medical Rec #:  [id number]       Height:       68.0 in Accession #:    [id number]      Weight:       146.0 lb Date of Birth:  [birth year]      BSA:          1.788 m Patient Age:    78 years        BP:           114/67 mmHg Patient Gender: M               HR:           95 bpm. Exam Location:  Inpatient Procedure: 2D Echo, Cardiac Doppler and Color Doppler (Both Spectral and Color            Flow Doppler were utilized during procedure). Indications:    Chest Pain R07.9  History:        Patient has prior history of Echocardiogram examinations, most                 recent 08/30/2019. CHF, CAD, Pacemaker, COPD, Arrythmias:PVC and                 NSVT; Risk Factors:Hyperlipidemia, Diabetes and Hypertension.  Sonographer:    BERNARDA ROCKS Referring Phys: [id number] SARA-MAIZ A THOMAS IMPRESSIONS  1. Left ventricular ejection fraction, by estimation, is 60 to 65%. The left ventricle has normal function. Left ventricular endocardial border not optimally defined to evaluate regional wall motion. Left ventricular diastolic parameters are indeterminate.  2. Right ventricular systolic function is normal. The right ventricular size is normal.  3. The mitral valve is abnormal. Mild mitral valve regurgitation.  4. The aortic valve was not well visualized. Aortic valve regurgitation is not visualized.  5. The inferior vena cava is dilated in size with <50% respiratory variability, suggesting right atrial pressure  of  15 mmHg. Conclusion(s)/Recommendation(s): Technically very limited study due to poor sound wave transmission. Many structures not seen well. FINDINGS  Left Ventricle: Left ventricular ejection fraction, by estimation, is 60 to 65%. The left ventricle has normal function. Left ventricular endocardial border not optimally defined to evaluate regional wall motion. The left ventricular internal cavity size was normal in size. Suboptimal image quality limits for assessment of left ventricular hypertrophy. Left ventricular diastolic parameters are indeterminate. Right Ventricle: The right ventricular size is normal. No increase in right ventricular wall thickness. Right ventricular systolic function is normal. Left Atrium: Left atrial size was normal in size. Right Atrium: Right atrial size was normal in size. Pericardium: There is no evidence of pericardial effusion. Mitral Valve: The mitral valve is abnormal. There is mild thickening of the mitral valve leaflet(s). Mild mitral valve regurgitation. MV peak gradient, 2.9 mmHg. The mean mitral valve gradient is 1.0 mmHg. Tricuspid Valve: The tricuspid valve is not well visualized. Tricuspid valve regurgitation is not demonstrated. Aortic Valve: The aortic valve was not well visualized. Aortic valve regurgitation is not visualized. Aortic valve mean gradient measures 1.0 mmHg. Aortic valve peak gradient measures 1.6 mmHg. Aortic valve area, by VTI measures 4.13 cm. Pulmonic Valve: The pulmonic valve was not well visualized. Pulmonic valve regurgitation is not visualized. Aorta: The aortic root is normal in size and structure. Venous: The inferior vena cava is dilated in size with less than 50% respiratory variability, suggesting right atrial pressure of 15 mmHg. IAS/Shunts: The interatrial septum was not well visualized. Additional Comments: A device lead is visualized.  LEFT VENTRICLE PLAX 2D LVIDd:         4.80 cm      Diastology LVIDs:         3.20 cm      LV e' medial:     5.98 cm/s LV PW:         0.90 cm      LV E/e' medial:  15.6 LV IVS:        1.00 cm      LV e' lateral:   13.20 cm/s LVOT diam:     2.10 cm      LV E/e' lateral: 7.1 LV SV:         40 LV SV Index:   22 LVOT Area:     3.46 cm  LV Volumes (MOD) LV vol d, MOD A2C: 99.7 ml LV vol d, MOD A4C: 159.0 ml LV vol s, MOD A2C: 37.6 ml LV vol s, MOD A4C: 54.4 ml LV SV MOD A2C:     62.1 ml LV SV MOD A4C:     159.0 ml LV SV MOD BP:      80.6 ml RIGHT VENTRICLE             IVC RV Basal diam:  3.50 cm     IVC diam: 2.10 cm RV S prime:     11.00 cm/s TAPSE (M-mode): 2.5 cm LEFT ATRIUM             Index        RIGHT ATRIUM           Index LA diam:        3.70 cm 2.07 cm/m   RA Area:     15.10 cm LA Vol (A2C):   26.9 ml 15.05 ml/m  RA Volume:   38.70 ml  21.65 ml/m LA Vol (A4C):   40.8 ml 22.82 ml/m LA Biplane Vol:  36.2 ml 20.25 ml/m  AORTIC VALVE                    PULMONIC VALVE AV Area (Vmax):    3.55 cm     PV Vmax:       0.68 m/s AV Area (Vmean):   3.30 cm     PV Peak grad:  1.8 mmHg AV Area (VTI):     4.13 cm AV Vmax:           64.10 cm/s AV Vmean:          41.400 cm/s AV VTI:            0.097 m AV Peak Grad:      1.6 mmHg AV Mean Grad:      1.0 mmHg LVOT Vmax:         65.70 cm/s LVOT Vmean:        39.500 cm/s LVOT VTI:          0.116 m LVOT/AV VTI ratio: 1.19  AORTA Ao Root diam: 3.40 cm Ao Asc diam:  3.50 cm MITRAL VALVE MV Area (PHT): 4.71 cm    SHUNTS MV Area VTI:   2.59 cm    Systemic VTI:  0.12 m MV Peak grad:  2.9 mmHg    Systemic Diam: 2.10 cm MV Mean grad:  1.0 mmHg MV Vmax:       0.84 m/s MV Vmean:      43.0 cm/s MV Decel Time: 161 msec MV E velocity: 93.30 cm/s MV A velocity: 46.70 cm/s MV E/A ratio:  2.00 Toribio Fuel MD Electronically signed by Toribio Fuel MD Signature Date/Time: 01/02/2024/11:52:55 PM    Final    CT ABDOMEN PELVIS W CONTRAST Result Date: 01/01/2024 EXAM: CT ABDOMEN AND PELVIS WITH CONTRAST 01/01/2024 07:13:00 PM TECHNIQUE: CT of the abdomen and pelvis was performed with the  administration of 50 mL of iohexol  (OMNIPAQUE ) 350 MG/ML injection. Multiplanar reformatted images are provided for review. Automated exposure control, iterative reconstruction, and/or weight-based adjustment of the mA/kV was utilized to reduce the radiation dose to as low as reasonably achievable. COMPARISON: 12/22/2023 CLINICAL HISTORY: Bowel obstruction suspected. FINDINGS: LOWER CHEST: See chest CT report today. LIVER: The liver is unremarkable. GALLBLADDER AND BILE DUCTS: Gallbladder is unremarkable. No biliary ductal dilatation. SPLEEN: No acute abnormality. PANCREAS: No acute abnormality. ADRENAL GLANDS: No acute abnormality. KIDNEYS, URETERS AND BLADDER: No stones in the kidneys or ureters. No hydronephrosis. No perinephric or periureteral stranding. Urinary bladder is unremarkable. GI AND BOWEL: Gaseous distention of the bowel involves both large and small bowel; however, distal small bowel loops are decompressed. While I favor this most likely reflects ileus, it is difficult to completely exclude distal small bowel obstruction. Moderate stool burden throughout the colon. Normal appendix. PERITONEUM AND RETROPERITONEUM: No ascites. No free air. VASCULATURE: Aorta is normal in caliber. Changes of prior aortobifemoral bypass. LYMPH NODES: No lymphadenopathy. REPRODUCTIVE ORGANS: Prostate enlargement. 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 No focal soft tissue abnormality. IMPRESSION: 1. Gaseous distention of bowel involving both large and small bowel with decompressed distal small bowel loops, most likely ileus; distal small bowel obstruction cannot be completely excluded. 2. 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 Electronically signed by: Franky Crease MD 01/01/2024 07:31 PM EST RP Workstation: [id number]   CT Angio Chest PE W and/or Wo Contrast Result Date:  01/01/2024 EXAM: CTA of the  Chest without and with contrast for PE 01/01/2024 07:13:00 PM TECHNIQUE: CTA of the chest was performed without and with the administration of 50 mL of iohexol  (OMNIPAQUE ) 350 MG/ML injection. Multiplanar reformatted images are provided for review. MIP images are provided for review. Automated exposure control, iterative reconstruction, and/or weight based adjustment of the mA/kV was utilized to reduce the radiation dose to as low as reasonably achievable. COMPARISON: 12/22/2023 CLINICAL HISTORY: Pulmonary embolism (PE) suspected, high prob. FINDINGS: PULMONARY ARTERIES: Pulmonary arteries are adequately opacified for evaluation. No pulmonary embolism. Main pulmonary artery is normal in caliber. MEDIASTINUM: The heart and pericardium demonstrate no acute abnormality. There is no acute abnormality of the thoracic aorta. Right hilar and mediastinal adenopathy again noted, stable. LYMPH NODES: Right hilar and mediastinal adenopathy again noted, stable. No axillary lymphadenopathy. LUNGS AND PLEURA: Right upper lobe mass measures 3 x 3 cm on image 58. No focal consolidation or pulmonary edema. No pleural effusion or pneumothorax. UPPER ABDOMEN: Limited images of the upper abdomen are unremarkable. SOFT TISSUES AND BONES: Numerous lytic bony metastases throughout the ribs with soft tissue masses are unchanged. Stable lytic metastases throughout the thoracic spine. IMPRESSION: 1. No evidence of pulmonary embolism. 2. Stable right upper lobe mass with unchanged right hilar and mediastinal adenopathy and diffuse lytic osseous metastases in the ribs and thoracic spine. Electronically signed by: Franky Crease MD 01/01/2024 07:26 PM EST RP Workstation: [id number]   DG Chest 2 View Result Date: 01/01/2024 EXAM: 2 VIEW(S) XRAY OF THE CHEST 01/01/2024 06:36:16 PM COMPARISON: None available. CLINICAL HISTORY: chest pain FINDINGS: LINES, TUBES AND DEVICES: Left chest pacemaker with leads overlying right  atrium and right ventricle. LUNGS AND PLEURA: Right upper lung opacity. Right hilar asymmetric density. Ovoid pleural-based opacity at the right apex corresponding to rib lesion on CT. No pulmonary edema. No pleural effusion. No pneumothorax. HEART AND MEDIASTINUM: No acute abnormality of the cardiac an silhouettes. BONES AND SOFT TISSUES: Known right second and third rib lesions. IMPRESSION: 1. Right upper lung and hilar opacity corresponding to the mass recently demonstrated on CT. 2. Right second and third rib lesions corresponding to metastatic lesions seen on CT Electronically signed by: Luke Bun MD 01/01/2024 06:56 PM EST RP Workstation: [id number]   CUP PACEART INCLINIC DEVICE CHECK Result Date: 01/01/2024 in-clinic _dual__ chamber ICD check. Presenting Rhythm: AF/VS_ . Routine testing was performed. Thresholds, sensing, and impedance demonstrate stable parameters and no programming changes needed. No treated arrhythmias. Estimated longevity __7.5 years__ . Pt enrolled in remote follow-up. AFib started 11/4, overall burden low, though increased of lat A lead impedance on a gradual downtrend though appears to have leveled off, and remains WNL no VT RU  CT CHEST ABDOMEN PELVIS W CONTRAST Result Date: 12/22/2023 EXAM: CT CHEST, ABDOMEN AND PELVIS WITH CONTRAST 12/22/2023 07:48:41 PM TECHNIQUE: CT of the chest, abdomen and pelvis was performed with the administration of 100 mL of iohexol  (OMNIPAQUE ) 300 MG/ML solution. Multiplanar reformatted images are provided for review. Automated exposure control, iterative reconstruction, and/or weight based adjustment of the mA/kV was utilized to reduce the radiation dose to as low as reasonably achievable. COMPARISON: CT pelvis earlier today CLINICAL HISTORY: Metastatic disease evaluation. Metastatic disease eval, abnormal CT, c/o left hip pain that started 2-3 weeks ago. Pt reports pain radiates into his leg and worse with movement. Pt denies falling or injury.  FINDINGS: CHEST: MEDIASTINUM AND LYMPH NODES: Heart and pericardium are unremarkable. Left subclavian icd Moderate coronary atherosclerosis of the lad Thoracic aortic atherosclerosis The central  airways are clear. 13 mm short axis right supraclavicular node (image 3). Additional thoracic lymphadenopathy, including a 2.3 cm right hilar node (image 25), and a 1.7 cm short axis subcarinal node (image 29). LUNGS AND PLEURA: 3.0 x 4.0 cm posterior right upper lobe/perihilar mass (image 45) compatible with primary bronchogenic carcinoma. No pleural effusion or pneumothorax. ABDOMEN AND PELVIS: LIVER: The liver is unremarkable. GALLBLADDER AND BILE DUCTS: Gallbladder is unremarkable. No biliary ductal dilatation. SPLEEN: No acute abnormality. PANCREAS: No acute abnormality. ADRENAL GLANDS: No acute abnormality. KIDNEYS, URETERS AND BLADDER: No stones in the kidneys or ureters. No hydronephrosis. No perinephric or periureteral stranding. Urinary bladder is unremarkable. GI AND BOWEL: Stomach demonstrates no acute abnormality. There is no bowel obstruction. REPRODUCTIVE ORGANS: Mild prostatomegaly. PERITONEUM AND RETROPERITONEUM: 2.5 cm soft tissue lesion beneath the left upper anterior abdominal wall (image 58) suggesting peritoneal metastasis in this clinical context. No ascites. No free air. VASCULATURE: Atherosclerotic calcification of the abdominal aorta. Aortobifemoral bypass graft, patent. ABDOMINAL AND PELVIS LYMPH NODES: No lymphadenopathy. BONES AND SOFT TISSUES: Lytic calcification metastases involving the anterior 2nd and posterior 3rd ribs (images 11 and 13) measuring up to 4.2 cm. Additional lytic soft tissue metastasis in the right posterior 8th rib. Lytic soft tissue metastasis involving the T9 and T11 vertebral bodies (sagittal image 102) with compression of the spinal canal at T11. Lytic soft tissue metastasis involving the left anterior acetabulum (image 113). Dominant 5.9 x 7.4 cm lytic soft tissue  metastasis involving the left hemipelvis, extending from the posterior acetabulum to the inferior pubic ramus (image 123). Additional scattered smaller lytic metastases in the visualized axial and appendicular skeleton. A 2.5 cm soft tissue lesion is noted beneath the left upper anterior abdominal wall (image 58). IMPRESSION: 1. 4.0 cm posterior right upper lobe/perihilar mass, compatible with primary bronchogenic carcinoma. 2. Thoracic nodal metastases, as above. A 13 mm short axis right supraclavicular node would be amenable to percutaneous sampling, as clinically warranted. 3. Suspected isolated peritoneal metastasis in the left upper abdomen. 4. Extensive osseous metastatic disease, as described above, including a T11 lesion narrowing the spinal canal. Consider MR with contrast for further evaluation, as clinically warranted. Electronically signed by: Pinkie Pebbles MD 12/22/2023 08:06 PM EDT RP Workstation: [id number]   CT PELVIS WO CONTRAST Result Date: 12/22/2023 CLINICAL DATA:  Possible lucent lesions on x-ray. Left hip and left thigh pain. No known injury. EXAM: CT PELVIS WITHOUT CONTRAST TECHNIQUE: Multidetector CT imaging of the pelvis was performed following the standard protocol without intravenous contrast. RADIATION DOSE REDUCTION: This exam was performed according to the departmental dose-optimization program which includes automated exposure control, adjustment of the mA and/or kV according to patient size and/or use of iterative reconstruction technique. COMPARISON:  12/22/2023. FINDINGS: Urinary Tract:  No abnormality visualized. Bowel: Unremarkable visualized pelvic bowel loops. Appendix appears normal. Vascular/Lymphatic: Findings suggestive of aortobifemoral bypass grafts. No pelvic lymphadenopathy by size criteria. Reproductive:  The prostate gland is enlarged. Other:  No ascites. Musculoskeletal: No acute fracture or dislocation is seen. Multiple lytic lesions are seen scattered  throughout the bones. There are large destructive lesions with soft tissue masses is involving the sacrum on the right, acetabulum on the left, and inferior pubic ramus on the left. Evaluation of the masses is limited due to lack of IV contrast. However the largest soft tissue mass is seen in the region the inferior pubic ramus on the left measuring 5.8 x 5.5 cm. IMPRESSION: Multiple lytic lesions scattered throughout the bones with large destructive  lesionsand associated soft tissue masses involving the sacrum on the right, acetabulum on the left, and inferior pubic ramus on the left. No pathologic fracture is seen. Differential diagnosis includes multiple myeloma/plasmacytoma versus metastatic disease. Electronically Signed   By: Leita Birmingham M.D.   On: 12/22/2023 18:31   DG Femur Min 2 Views Left Result Date: 12/22/2023 EXAM: 2 VIEW(S) XRAY OF THE LEFT FEMUR 12/22/2023 05:53:00 PM COMPARISON: None available. CLINICAL HISTORY: left hip/femur pain. Per triage ;  Pt arrives with c/o left hip pain that started 2-3 weeks ago. Pt reports pain radiates into his leg and worse with movement. Pt denies falling or injury. ; Best lateral obtainable, pt had trouble staying still once in position FINDINGS: BONES AND JOINTS: Left inferior pubic ramus appears fractured and fragmented with some areas of focal lucency. The left femur appears intact. There are mild degenerative changes of the hip and knee. No joint dislocation. SOFT TISSUES: Peripheral vascular calcifications are present. IMPRESSION: 1. Fracture and fragmentation of the left inferior pubic ramus with focal lucency. 2. Mild degenerative changes of the hip and knee. Electronically signed by: Greig Pique MD 12/22/2023 06:10 PM EDT RP Workstation: [id number]   DG Pelvis 1-2 Views Result Date: 12/22/2023 EXAM: 1 or 2 VIEW(S) XRAY OF THE PELVIS 12/22/2023 04:57:18 PM COMPARISON: None available. CLINICAL HISTORY: Left hip pain. FINDINGS: BONES AND JOINTS:  Questionable erosive changes and lucent lesions in the left inferior pubic ramus. No joint dislocation. SOFT TISSUES: Surgical clips overlie the bilateral inguinal regions. Peripheral vascular calcifications are present. STOMACH AND BOWEL: Diffuse gaseous distention of visualized colon. IMPRESSION: 1. Questionable erosive changes and lucent lesions in the left inferior pubic ramus. Recommend further evaluation with CT. 2. Diffuse gaseous distention of visualized colon. Electronically signed by: Greig Pique MD 12/22/2023 05:19 PM EDT RP Workstation: [id number]    Labs:  CBC: Recent Labs    01/13/24 1505 01/14/24 0232 01/15/24 0405 01/17/24 0029  WBC 14.6* 11.5* 15.2* 14.0*  HGB 10.6* 9.9* 9.0* 8.4*  HCT 33.7* 32.3* 28.4* 27.6*  PLT 365 281 276 258    COAGS: Recent Labs    01/02/24 1742 01/03/24 0425  APTT 42* 65*    BMP: Recent Labs    01/14/24 0232 01/15/24 0405 01/16/24 0854 01/17/24 0032  NA 138 135 133* 133*  K 4.2 4.6 4.7 4.9  CL 98 100 98 98  CO2 30 25 27 25   GLUCOSE 119* 134* 166* 205*  BUN 21 29* 32* 36*  CALCIUM  11.6* 10.5* 10.8* 10.0  CREATININE 1.17 1.22 1.24 1.25*  GFRNONAA >60 60* 59* 58*    LIVER FUNCTION TESTS: Recent Labs    01/14/24 0232 01/15/24 0405 01/16/24 0854 01/17/24 0032  BILITOT 0.5 0.6 0.3 0.3  AST 25 22 27  32  ALT 19 24 29  36  ALKPHOS 99 72 89 100  PROT 6.7 6.0* 6.5 6.1*  ALBUMIN 3.3* 2.3* 3.3* 3.1*    TUMOR MARKERS: No results for input(s): AFPTM, CEA, CA199, CHROMGRNA in the last 8760 hours.  Assessment and Plan: 78 y.o. male ex smoker with PMH sig for anemia, CAD with prior MI/stenting, afib, GERD, HLD,HTN, PAD with AFBG, ICD placement,DM, prior cardiac arrest 2021, CHF. 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 Pt seen by oncology this admission and wants to continue full scope of treatment. Port a cath placement now requested.  Imaging studies have been reviewed by Dr. Jenna.Risks and benefits of image  guided port-a-catheter placement was discussed with the patient including, but not limited to bleeding, infection, pneumothorax, or fibrin sheath development and need for additional procedures.  All of the patient's questions were answered, patient is agreeable to proceed. Consent signed and in chart.  Procedure tent planned for today  Thank you for allowing our service to participate in Peter Lambert 's care.  Electronically Signed: D. Franky Rakers, PA-C   01/17/2024, 9:45 AM      I spent a total of  25 minutes   in face to face in clinical consultation, greater than 50% of which was counseling/coordinating care for port a cath placement

## 2024-01-17 NOTE — Progress Notes (Signed)
 Peter Lambert   DOB:March 22, 1945   FM#:[id number]      ASSESSMENT & PLAN:  Peter Lambert 78 y.o. male admitted 01/13/2024 with complaints of weakness.  Patient was previously diagnosed with squamous cell lung carcinoma 1 week ago and had been scheduled to see Dr. Davonna at [facility] as outpatient.  Medical Oncology now following.   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 - Status post bronchoscopy 01/07/2024 which showed lung mass which was biopsied. -- Pathology confirmed non-small cell carcinoma consistent with squamous cell carcinoma. - PET scan done 01/09/2024 showed widespread bone mets. -- MRI brain unable to be done today due to defib. Defer to outpatient, Dr. Davonna aware.  -- Port placed today for chemotherapy when discharged.  -- All oncologic treatments are palliative. Explained this to family.   - Medical oncology/Dr. Pasam following for this hospitalization.  Patient follows with Dr. Davonna at [facility].   Pathologic fractures T-spine Back pain - Over the last several days patient has had a decline due to lower back pain and increasing weakness.  Family member states that patient has lived by himself and ambulated with a cane which has been difficult to do within the last week or so. - Imaging done shows pathologic compression fractures with epidural tumor at T11.  There is also S1 lesion. - Radiation oncology evaluation and management in progress, CT SIM done.  Plans noted for RT to hip and spine x8 fractions.       Anemia, likely from malignancy. - Hemoglobin 8.4 - Recommend PRBC transfusion for hemoglobin <7.0 - Continue to monitor CBC with differential   A-fib - On Eliquis  - Monitor for bleeding   Hypercalcemia of malignancy - Calcium  level 11.9 on admission.  Received Zometa .  Calcium  level steadily decreasing, 10.0 today. - Continue IV fluids as ordered - Continue to monitor calcium  levels  closely  Leukocytosis -- Likely steroid effect -- WBC 14.0 -- Continue to monitor closely  Renal dysfunction -- Slightly elevated creatinine and BUN -- Avoid nephrotoxic agents -- Monitor renal function  Code Status Full   Subjective:  Remains stable with no new complaints. Ongoing back pain.  Patient's sister and daughter in law at bedside. Answered all questions to their satisfaction.   Objective:   Intake/Output Summary (Last 24 hours) at 01/17/2024 1008 Last data filed at 01/17/2024 0600 Gross per 24 hour  Intake --  Output 550 ml  Net -550 ml     PHYSICAL EXAMINATION: ECOG PERFORMANCE STATUS: 3 - Symptomatic, >50% confined to bed  Vitals:   01/16/24 2218 01/17/24 0521  BP: (!) 112/58 109/61  Pulse: 83 70  Resp: 18 18  Temp: 97.8 F (36.6 C) 97.9 F (36.6 C)  SpO2: 100% 100%   Filed Weights   01/13/24 1429  Weight: 142 lb (64.4 kg)    GENERAL: alert, no distress and comfortable +elderly +weak-appearing SKIN: skin color, texture, turgor are normal, no rashes or significant lesions EYES: normal, conjunctiva are pink and non-injected, sclera clear OROPHARYNX: no exudate, no erythema and lips, buccal mucosa, and tongue normal  NECK: supple, thyroid  normal size, non-tender, without nodularity LYMPH: no palpable lymphadenopathy in the cervical, axillary or inguinal LUNGS: clear to auscultation and percussion with normal breathing effort HEART: regular rate & rhythm and no murmurs and no lower extremity edema ABDOMEN: abdomen soft, non-tender and normal bowel sounds MUSCULOSKELETAL: no cyanosis of digits and no clubbing  PSYCH: alert & oriented x 3 with  fluent speech NEURO: no focal motor/sensory deficits   All questions were answered. The patient knows to call the clinic with any problems, questions or concerns.   The total time spent in the appointment was 40 minutes encounter with patient including review of chart and various tests results, discussions  about plan of care and coordination of care plan  [name], NP 01/17/2024 10:08 AM    Labs Reviewed:  Lab Results  Component Value Date   WBC 14.0 (H) 01/17/2024   HGB 8.4 (L) 01/17/2024   HCT 27.6 (L) 01/17/2024   MCV 84.1 01/17/2024   PLT 258 01/17/2024   Recent Labs    01/15/24 0405 01/16/24 0854 01/17/24 0032  NA 135 133* 133*  K 4.6 4.7 4.9  CL 100 98 98  CO2 25 27 25   GLUCOSE 134* 166* 205*  BUN 29* 32* 36*  CREATININE 1.22 1.24 1.25*  CALCIUM  10.5* 10.8* 10.0  GFRNONAA 60* 59* 58*  PROT 6.0* 6.5 6.1*  ALBUMIN 2.3* 3.3* 3.1*  AST 22 27 32  ALT 24 29 36  ALKPHOS 72 89 100  BILITOT 0.6 0.3 0.3    Studies Reviewed:  MR Lumbar Spine W Wo Contrast Result Date: 01/13/2024 EXAM: MRI LUMBAR SPINE 01/13/2024 06:58:11 PM TECHNIQUE: Multiplanar multisequence MRI of the lumbar spine was performed with and without the administration of intravenous contrast. 6 mL of gadobutrol  (GADAVIST ) 1 MMOL/ML injection was administered. COMPARISON: PET CT 01/09/2024. CT abdomen and pelvis 01/01/2024. CLINICAL HISTORY: Myelopathy, acute, lumbar spine. FINDINGS: BONES AND ALIGNMENT: 5 lumbar type vertebrae. Normal alignment. Multiple T1 hypointense, enhancing lesions consistent with previously shown metastases. Numerous small lesions are scattered throughout the vertebral bodies and posterior elements of the lumbar spine as well as a larger, 2 cm lesion in the L1 vertebral body. Preserved lumbar vertebral body heights. 4 cm destructive lesion involving the anterior sacrum at S1 on the right with extraosseous tumor potentially affecting the extraforaminal right L5 nerve. Bilateral iliac lesions. Mild multilevel neural foraminal stenosis. SPINAL CORD: The conus medullaris terminates at L1-L2 and is normal in signal. No evidence of epidural tumor in the lumbar spine. SOFT TISSUES: Status post aortobifemoral bypass. DISC LEVELS: Mild diffuse lumbar spondylosis and moderate facet hypertrophy  without significant spinal stenosis. Mild multilevel neural foraminal stenosis. IMPRESSION: 1. Known widespread osseous metastases including a destructive 4 cm S1 lesion. 2. No evidence of epidural tumor or spinal stenosis in the lumbar spine. Electronically signed by: Dasie Hamburg MD 01/13/2024 08:00 PM EST RP Workstation: [id number]   MR THORACIC SPINE W WO CONTRAST Result Date: 01/13/2024 EXAM: MRI THORACIC SPINE WITH AND WITHOUT INTRAVENOUS CONTRAST 01/13/2024 06:58:11 PM TECHNIQUE: Multiplanar multisequence MRI of the thoracic spine was performed with and without the administration of intravenous contrast. COMPARISON: PET CT 01/09/2024. CTA chest and CT abdomen and pelvis 01/01/2024. CLINICAL HISTORY: Mid-back pain, neuro deficit; known T11 mass. FINDINGS: BONES AND ALIGNMENT: Normal alignment. Multiple T1 hypointense, enhancing lesions consistent with previously shown metastases, including involvement of the T6 and T9 to T11 vertebral bodies as well as destructive lesions of the posterior right 3rd and 8th ribs. The T9 lesion extends into the right pedicle, and the T11 lesion involves the primarily left sided posterior elements. Pathologic compression fractures with 10% vertebral body height loss at T9 and 45% central vertebral body height loss at T11. Right sided ventral and lateral epidural tumor at T9 results in mild spinal stenosis and encroaches upon the medial aspect of the right T9-T10 neural foramen.  There is a larger amount of ventral and lateral epidural tumor bilaterally at T11 which extends superiorly to T10 and results in moderate spinal stenosis without cord compression. Tumor also encroaches upon the left sided neural foramina at T11-T12 greater than T10-T11. SPINAL CORD: Normal spinal cord signal. SOFT TISSUES: Known right upper lobe lung mass. DEGENERATIVE CHANGES: Mild thoracic spondylosis without significant degenerative spinal stenosis. LUNGS: IMPRESSION: 1. Known widespread osseous  metastases. 2. Pathologic compression fractures with 10% height loss at T9 and 45% height loss at T11. 3. Epidural tumor at T11 resulting in moderate spinal stenosis. 4. Smaller volume epidural tumor at T9 resulting in mild spinal stenosis. Electronically signed by: Dasie Hamburg MD 01/13/2024 07:50 PM EST RP Workstation: [id number]   DG Chest 1 View Result Date: 01/13/2024 CLINICAL DATA:  Weakness, immobility, recent falls, pain to toes with possible infection. EXAM: DG CHEST 1V COMPARISON:  January 01, 2024 FINDINGS: There is stable dual lead AICD positioning. The heart size and mediastinal contours are within normal limits. Mild right upper lobe scarring and/or atelectasis is seen. Adjacent ill-defined right upper lobe opacities are noted which correspond to right upper lobe suprahilar and pleural based mass is seen on recent chest CT (January 01, 2024). No pleural effusion or pneumothorax is identified. Cortical destruction of the anterior aspect of the second right rib is seen. IMPRESSION: 1. Mild right upper lobe scarring and/or atelectasis. 2. Right upper lobe suprahilar and pleural based mass lesions, as described above. 3. Cortical destruction of the anterior aspect of the second right rib. Electronically Signed   By: Suzen Dials M.D.   On: 01/13/2024 16:22   NM PET Image Initial (PI) Skull Base To Thigh Result Date: 01/12/2024 CLINICAL DATA:  Initial treatment strategy for right upper lobe lung mass with evidence of metastatic disease prior CT. EXAM: NUCLEAR MEDICINE PET SKULL BASE TO THIGH TECHNIQUE: 7.32 mCi F-18 FDG was injected intravenously. Full-ring PET imaging was performed from the skull base to thigh after the radiotracer. CT data was obtained and used for attenuation correction and anatomic localization. Fasting blood glucose: 107 mg/dl COMPARISON:  CT of the chest, abdomen and pelvis 12/22/2023 and 01/01/2024 FINDINGS: Mediastinal blood pool activity: SUV max 1.7 NECK: No  hypermetabolic cervical lymph nodes are identified. No suspicious activity identified within the pharyngeal mucosal space. Incidental CT findings: Bilateral carotid atherosclerosis. CHEST: The dominant right perihilar upper lobe mass is hypermetabolic with an SUV max 6.6. This mass measures 3.5 x 2.9 cm on image 25/205. No hypermetabolic pulmonary activity or suspicious nodularity in the left lung. There are hypermetabolic mediastinal lymph nodes, including a subcarinal node measuring 1.9 cm short axis (SUV max 5.5). Incidental CT findings: Left subclavian pacemaker leads extend into the right atrium and right ventricle. Atherosclerosis of the aorta, great vessels and coronary arteries. Mild centrilobular emphysema present ABDOMEN/PELVIS: There is no hypermetabolic activity within the liver, adrenal glands, spleen or pancreas. There is no hypermetabolic nodal activity in the abdomen or pelvis. Previously demonstrated peritoneal nodularity anteriorly in the left upper quadrant is hypermetabolic. Largest component measures 2.2 x 1.4 cm on image 81/2 and has an SUV max of 6.2. No significant ascites. Incidental CT findings: Diffuse gaseous distension of the bowel again noted without evidence of obstruction or perforation. Aortic and branch vessel atherosclerosis post aortobifemoral bypass. Moderate prostatomegaly without focal hypermetabolic activity. SKELETON: As seen on prior imaging, there is widespread osseous metastatic disease with associated hypermetabolic activity. Representative lesions include a lytic lesion with a soft tissue component  in the anterior aspect of the right 2nd rib, measuring 4.0 x 2.2 cm image 44/202 (SUV max 6.2), a lesion involving the posterior aspect the right 3rd rib, measuring 4.2 cm on image 36/202 (SUV max 8.0), and a large expansile destructive mass involving the left ischium, measuring 6.7 x 5.5 cm on image 144/202 (SUV max 8.4). There are multiple other lesions, including metastases  within the T9 and T11 vertebral bodies. The T11 lesion is associated with anterior epidural tumor which may place the patient at risk for cord compression. Appearance is similar to previous CT. Incidental CT findings: none IMPRESSION: 1. The dominant right upper lobe lung mass is hypermetabolic, consistent with primary bronchogenic carcinoma. 2. Hypermetabolic mediastinal adenopathy consistent with metastatic disease. 3. Widespread osseous metastatic disease with multiple lesions as described. The T11 lesion is associated with anterior epidural tumor which may place the patient at risk for cord compression. 4. Hypermetabolic peritoneal nodularity in the left upper quadrant consistent with metastatic disease. 5. Aortic Atherosclerosis (ICD10-I70.0) and Emphysema (ICD10-J43.9). Electronically Signed   By: Elsie Perone M.D.   On: 01/12/2024 10:46   ECHOCARDIOGRAM COMPLETE Result Date: 01/02/2024    ECHOCARDIOGRAM REPORT   Patient Name:   DRELYN PISTILLI Date of Exam: 01/02/2024 Medical Rec #:  [id number]       Height:       68.0 in Accession #:    [id number]      Weight:       146.0 lb Date of Birth:  [birth year]      BSA:          1.788 m Patient Age:    78 years        BP:           114/67 mmHg Patient Gender: M               HR:           95 bpm. Exam Location:  Inpatient Procedure: 2D Echo, Cardiac Doppler and Color Doppler (Both Spectral and Color            Flow Doppler were utilized during procedure). Indications:    Chest Pain R07.9  History:        Patient has prior history of Echocardiogram examinations, most                 recent 08/30/2019. CHF, CAD, Pacemaker, COPD, Arrythmias:PVC and                 NSVT; Risk Factors:Hyperlipidemia, Diabetes and Hypertension.  Sonographer:    BERNARDA ROCKS Referring Phys: [id number] SARA-MAIZ A THOMAS IMPRESSIONS  1. Left ventricular ejection fraction, by estimation, is 60 to 65%. The left ventricle has normal function. Left ventricular endocardial border not optimally  defined to evaluate regional wall motion. Left ventricular diastolic parameters are indeterminate.  2. Right ventricular systolic function is normal. The right ventricular size is normal.  3. The mitral valve is abnormal. Mild mitral valve regurgitation.  4. The aortic valve was not well visualized. Aortic valve regurgitation is not visualized.  5. The inferior vena cava is dilated in size with <50% respiratory variability, suggesting right atrial pressure of 15 mmHg. Conclusion(s)/Recommendation(s): Technically very limited study due to poor sound wave transmission. Many structures not seen well. FINDINGS  Left Ventricle: Left ventricular ejection fraction, by estimation, is 60 to 65%. The left ventricle has normal function. Left ventricular endocardial border not optimally defined to evaluate regional wall motion. The left ventricular  internal cavity size was normal in size. Suboptimal image quality limits for assessment of left ventricular hypertrophy. Left ventricular diastolic parameters are indeterminate. Right Ventricle: The right ventricular size is normal. No increase in right ventricular wall thickness. Right ventricular systolic function is normal. Left Atrium: Left atrial size was normal in size. Right Atrium: Right atrial size was normal in size. Pericardium: There is no evidence of pericardial effusion. Mitral Valve: The mitral valve is abnormal. There is mild thickening of the mitral valve leaflet(s). Mild mitral valve regurgitation. MV peak gradient, 2.9 mmHg. The mean mitral valve gradient is 1.0 mmHg. Tricuspid Valve: The tricuspid valve is not well visualized. Tricuspid valve regurgitation is not demonstrated. Aortic Valve: The aortic valve was not well visualized. Aortic valve regurgitation is not visualized. Aortic valve mean gradient measures 1.0 mmHg. Aortic valve peak gradient measures 1.6 mmHg. Aortic valve area, by VTI measures 4.13 cm. Pulmonic Valve: The pulmonic valve was not well  visualized. Pulmonic valve regurgitation is not visualized. Aorta: The aortic root is normal in size and structure. Venous: The inferior vena cava is dilated in size with less than 50% respiratory variability, suggesting right atrial pressure of 15 mmHg. IAS/Shunts: The interatrial septum was not well visualized. Additional Comments: A device lead is visualized.  LEFT VENTRICLE PLAX 2D LVIDd:         4.80 cm      Diastology LVIDs:         3.20 cm      LV e' medial:    5.98 cm/s LV PW:         0.90 cm      LV E/e' medial:  15.6 LV IVS:        1.00 cm      LV e' lateral:   13.20 cm/s LVOT diam:     2.10 cm      LV E/e' lateral: 7.1 LV SV:         40 LV SV Index:   22 LVOT Area:     3.46 cm  LV Volumes (MOD) LV vol d, MOD A2C: 99.7 ml LV vol d, MOD A4C: 159.0 ml LV vol s, MOD A2C: 37.6 ml LV vol s, MOD A4C: 54.4 ml LV SV MOD A2C:     62.1 ml LV SV MOD A4C:     159.0 ml LV SV MOD BP:      80.6 ml RIGHT VENTRICLE             IVC RV Basal diam:  3.50 cm     IVC diam: 2.10 cm RV S prime:     11.00 cm/s TAPSE (M-mode): 2.5 cm LEFT ATRIUM             Index        RIGHT ATRIUM           Index LA diam:        3.70 cm 2.07 cm/m   RA Area:     15.10 cm LA Vol (A2C):   26.9 ml 15.05 ml/m  RA Volume:   38.70 ml  21.65 ml/m LA Vol (A4C):   40.8 ml 22.82 ml/m LA Biplane Vol: 36.2 ml 20.25 ml/m  AORTIC VALVE                    PULMONIC VALVE AV Area (Vmax):    3.55 cm     PV Vmax:       0.68 m/s AV Area (Vmean):  3.30 cm     PV Peak grad:  1.8 mmHg AV Area (VTI):     4.13 cm AV Vmax:           64.10 cm/s AV Vmean:          41.400 cm/s AV VTI:            0.097 m AV Peak Grad:      1.6 mmHg AV Mean Grad:      1.0 mmHg LVOT Vmax:         65.70 cm/s LVOT Vmean:        39.500 cm/s LVOT VTI:          0.116 m LVOT/AV VTI ratio: 1.19  AORTA Ao Root diam: 3.40 cm Ao Asc diam:  3.50 cm MITRAL VALVE MV Area (PHT): 4.71 cm    SHUNTS MV Area VTI:   2.59 cm    Systemic VTI:  0.12 m MV Peak grad:  2.9 mmHg    Systemic Diam: 2.10 cm MV  Mean grad:  1.0 mmHg MV Vmax:       0.84 m/s MV Vmean:      43.0 cm/s MV Decel Time: 161 msec MV E velocity: 93.30 cm/s MV A velocity: 46.70 cm/s MV E/A ratio:  2.00 Toribio Fuel MD Electronically signed by Toribio Fuel MD Signature Date/Time: 01/02/2024/11:52:55 PM    Final    CT ABDOMEN PELVIS W CONTRAST Result Date: 01/01/2024 EXAM: CT ABDOMEN AND PELVIS WITH CONTRAST 01/01/2024 07:13:00 PM TECHNIQUE: CT of the abdomen and pelvis was performed with the administration of 50 mL of iohexol  (OMNIPAQUE ) 350 MG/ML injection. Multiplanar reformatted images are provided for review. Automated exposure control, iterative reconstruction, and/or weight-based adjustment of the mA/kV was utilized to reduce the radiation dose to as low as reasonably achievable. COMPARISON: 12/22/2023 CLINICAL HISTORY: Bowel obstruction suspected. FINDINGS: LOWER CHEST: See chest CT report today. LIVER: The liver is unremarkable. GALLBLADDER AND BILE DUCTS: Gallbladder is unremarkable. No biliary ductal dilatation. SPLEEN: No acute abnormality. PANCREAS: No acute abnormality. ADRENAL GLANDS: No acute abnormality. KIDNEYS, URETERS AND BLADDER: No stones in the kidneys or ureters. No hydronephrosis. No perinephric or periureteral stranding. Urinary bladder is unremarkable. GI AND BOWEL: Gaseous distention of the bowel involves both large and small bowel; however, distal small bowel loops are decompressed. While I favor this most likely reflects ileus, it is difficult to completely exclude distal small bowel obstruction. Moderate stool burden throughout the colon. Normal appendix. PERITONEUM AND RETROPERITONEUM: No ascites. No free air. VASCULATURE: Aorta is normal in caliber. Changes of prior aortobifemoral bypass. LYMPH NODES: No lymphadenopathy. REPRODUCTIVE ORGANS: Prostate enlargement. 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 No focal soft tissue  abnormality. IMPRESSION: 1. Gaseous distention of bowel involving both large and small bowel with decompressed distal small bowel loops, most likely ileus; distal small bowel obstruction cannot be completely excluded. 2. 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 Electronically signed by: Franky Crease MD 01/01/2024 07:31 PM EST RP Workstation: [id number]   CT Angio Chest PE W and/or Wo Contrast Result Date: 01/01/2024 EXAM: CTA of the Chest without and with contrast for PE 01/01/2024 07:13:00 PM TECHNIQUE: CTA of the chest was performed without and with the administration of 50 mL of iohexol  (OMNIPAQUE ) 350 MG/ML injection. Multiplanar reformatted images are provided for review. MIP images are provided for review. Automated exposure control, iterative reconstruction, and/or weight based adjustment of the  mA/kV was utilized to reduce the radiation dose to as low as reasonably achievable. COMPARISON: 12/22/2023 CLINICAL HISTORY: Pulmonary embolism (PE) suspected, high prob. FINDINGS: PULMONARY ARTERIES: Pulmonary arteries are adequately opacified for evaluation. No pulmonary embolism. Main pulmonary artery is normal in caliber. MEDIASTINUM: The heart and pericardium demonstrate no acute abnormality. There is no acute abnormality of the thoracic aorta. Right hilar and mediastinal adenopathy again noted, stable. LYMPH NODES: Right hilar and mediastinal adenopathy again noted, stable. No axillary lymphadenopathy. LUNGS AND PLEURA: Right upper lobe mass measures 3 x 3 cm on image 58. No focal consolidation or pulmonary edema. No pleural effusion or pneumothorax. UPPER ABDOMEN: Limited images of the upper abdomen are unremarkable. SOFT TISSUES AND BONES: Numerous lytic bony metastases throughout the ribs with soft tissue masses are unchanged. Stable lytic metastases throughout the thoracic spine. IMPRESSION: 1. No evidence of pulmonary embolism. 2. Stable right upper  lobe mass with unchanged right hilar and mediastinal adenopathy and diffuse lytic osseous metastases in the ribs and thoracic spine. Electronically signed by: Franky Crease MD 01/01/2024 07:26 PM EST RP Workstation: [id number]   DG Chest 2 View Result Date: 01/01/2024 EXAM: 2 VIEW(S) XRAY OF THE CHEST 01/01/2024 06:36:16 PM COMPARISON: None available. CLINICAL HISTORY: chest pain FINDINGS: LINES, TUBES AND DEVICES: Left chest pacemaker with leads overlying right atrium and right ventricle. LUNGS AND PLEURA: Right upper lung opacity. Right hilar asymmetric density. Ovoid pleural-based opacity at the right apex corresponding to rib lesion on CT. No pulmonary edema. No pleural effusion. No pneumothorax. HEART AND MEDIASTINUM: No acute abnormality of the cardiac an silhouettes. BONES AND SOFT TISSUES: Known right second and third rib lesions. IMPRESSION: 1. Right upper lung and hilar opacity corresponding to the mass recently demonstrated on CT. 2. Right second and third rib lesions corresponding to metastatic lesions seen on CT Electronically signed by: Luke Bun MD 01/01/2024 06:56 PM EST RP Workstation: [id number]   CUP PACEART INCLINIC DEVICE CHECK Result Date: 01/01/2024 in-clinic _dual__ chamber ICD check. Presenting Rhythm: AF/VS_ . Routine testing was performed. Thresholds, sensing, and impedance demonstrate stable parameters and no programming changes needed. No treated arrhythmias. Estimated longevity __7.5 years__ . Pt enrolled in remote follow-up. AFib started 11/4, overall burden low, though increased of lat A lead impedance on a gradual downtrend though appears to have leveled off, and remains WNL no VT RU  CT CHEST ABDOMEN PELVIS W CONTRAST Result Date: 12/22/2023 EXAM: CT CHEST, ABDOMEN AND PELVIS WITH CONTRAST 12/22/2023 07:48:41 PM TECHNIQUE: CT of the chest, abdomen and pelvis was performed with the administration of 100 mL of iohexol  (OMNIPAQUE ) 300 MG/ML solution. Multiplanar reformatted  images are provided for review. Automated exposure control, iterative reconstruction, and/or weight based adjustment of the mA/kV was utilized to reduce the radiation dose to as low as reasonably achievable. COMPARISON: CT pelvis earlier today CLINICAL HISTORY: Metastatic disease evaluation. Metastatic disease eval, abnormal CT, c/o left hip pain that started 2-3 weeks ago. Pt reports pain radiates into his leg and worse with movement. Pt denies falling or injury. FINDINGS: CHEST: MEDIASTINUM AND LYMPH NODES: Heart and pericardium are unremarkable. Left subclavian icd Moderate coronary atherosclerosis of the lad Thoracic aortic atherosclerosis The central airways are clear. 13 mm short axis right supraclavicular node (image 3). Additional thoracic lymphadenopathy, including a 2.3 cm right hilar node (image 25), and a 1.7 cm short axis subcarinal node (image 29). LUNGS AND PLEURA: 3.0 x 4.0 cm posterior right upper lobe/perihilar mass (image 45) compatible with primary bronchogenic carcinoma. No pleural  effusion or pneumothorax. ABDOMEN AND PELVIS: LIVER: The liver is unremarkable. GALLBLADDER AND BILE DUCTS: Gallbladder is unremarkable. No biliary ductal dilatation. SPLEEN: No acute abnormality. PANCREAS: No acute abnormality. ADRENAL GLANDS: No acute abnormality. KIDNEYS, URETERS AND BLADDER: No stones in the kidneys or ureters. No hydronephrosis. No perinephric or periureteral stranding. Urinary bladder is unremarkable. GI AND BOWEL: Stomach demonstrates no acute abnormality. There is no bowel obstruction. REPRODUCTIVE ORGANS: Mild prostatomegaly. PERITONEUM AND RETROPERITONEUM: 2.5 cm soft tissue lesion beneath the left upper anterior abdominal wall (image 58) suggesting peritoneal metastasis in this clinical context. No ascites. No free air. VASCULATURE: Atherosclerotic calcification of the abdominal aorta. Aortobifemoral bypass graft, patent. ABDOMINAL AND PELVIS LYMPH NODES: No lymphadenopathy. BONES AND SOFT  TISSUES: Lytic calcification metastases involving the anterior 2nd and posterior 3rd ribs (images 11 and 13) measuring up to 4.2 cm. Additional lytic soft tissue metastasis in the right posterior 8th rib. Lytic soft tissue metastasis involving the T9 and T11 vertebral bodies (sagittal image 102) with compression of the spinal canal at T11. Lytic soft tissue metastasis involving the left anterior acetabulum (image 113). Dominant 5.9 x 7.4 cm lytic soft tissue metastasis involving the left hemipelvis, extending from the posterior acetabulum to the inferior pubic ramus (image 123). Additional scattered smaller lytic metastases in the visualized axial and appendicular skeleton. A 2.5 cm soft tissue lesion is noted beneath the left upper anterior abdominal wall (image 58). IMPRESSION: 1. 4.0 cm posterior right upper lobe/perihilar mass, compatible with primary bronchogenic carcinoma. 2. Thoracic nodal metastases, as above. A 13 mm short axis right supraclavicular node would be amenable to percutaneous sampling, as clinically warranted. 3. Suspected isolated peritoneal metastasis in the left upper abdomen. 4. Extensive osseous metastatic disease, as described above, including a T11 lesion narrowing the spinal canal. Consider MR with contrast for further evaluation, as clinically warranted. Electronically signed by: Pinkie Pebbles MD 12/22/2023 08:06 PM EDT RP Workstation: [id number]   CT PELVIS WO CONTRAST Result Date: 12/22/2023 CLINICAL DATA:  Possible lucent lesions on x-ray. Left hip and left thigh pain. No known injury. EXAM: CT PELVIS WITHOUT CONTRAST TECHNIQUE: Multidetector CT imaging of the pelvis was performed following the standard protocol without intravenous contrast. RADIATION DOSE REDUCTION: This exam was performed according to the departmental dose-optimization program which includes automated exposure control, adjustment of the mA and/or kV according to patient size and/or use of iterative  reconstruction technique. COMPARISON:  12/22/2023. FINDINGS: Urinary Tract:  No abnormality visualized. Bowel: Unremarkable visualized pelvic bowel loops. Appendix appears normal. Vascular/Lymphatic: Findings suggestive of aortobifemoral bypass grafts. No pelvic lymphadenopathy by size criteria. Reproductive:  The prostate gland is enlarged. Other:  No ascites. Musculoskeletal: No acute fracture or dislocation is seen. Multiple lytic lesions are seen scattered throughout the bones. There are large destructive lesions with soft tissue masses is involving the sacrum on the right, acetabulum on the left, and inferior pubic ramus on the left. Evaluation of the masses is limited due to lack of IV contrast. However the largest soft tissue mass is seen in the region the inferior pubic ramus on the left measuring 5.8 x 5.5 cm. IMPRESSION: Multiple lytic lesions scattered throughout the bones with large destructive lesionsand associated soft tissue masses involving the sacrum on the right, acetabulum on the left, and inferior pubic ramus on the left. No pathologic fracture is seen. Differential diagnosis includes multiple myeloma/plasmacytoma versus metastatic disease. Electronically Signed   By: Leita Birmingham M.D.   On: 12/22/2023 18:31   DG Femur Min 2  Views Left Result Date: 12/22/2023 EXAM: 2 VIEW(S) XRAY OF THE LEFT FEMUR 12/22/2023 05:53:00 PM COMPARISON: None available. CLINICAL HISTORY: left hip/femur pain. Per triage ;  Pt arrives with c/o left hip pain that started 2-3 weeks ago. Pt reports pain radiates into his leg and worse with movement. Pt denies falling or injury. ; Best lateral obtainable, pt had trouble staying still once in position FINDINGS: BONES AND JOINTS: Left inferior pubic ramus appears fractured and fragmented with some areas of focal lucency. The left femur appears intact. There are mild degenerative changes of the hip and knee. No joint dislocation. SOFT TISSUES: Peripheral vascular  calcifications are present. IMPRESSION: 1. Fracture and fragmentation of the left inferior pubic ramus with focal lucency. 2. Mild degenerative changes of the hip and knee. Electronically signed by: Greig Pique MD 12/22/2023 06:10 PM EDT RP Workstation: [id number]   DG Pelvis 1-2 Views Result Date: 12/22/2023 EXAM: 1 or 2 VIEW(S) XRAY OF THE PELVIS 12/22/2023 04:57:18 PM COMPARISON: None available. CLINICAL HISTORY: Left hip pain. FINDINGS: BONES AND JOINTS: Questionable erosive changes and lucent lesions in the left inferior pubic ramus. No joint dislocation. SOFT TISSUES: Surgical clips overlie the bilateral inguinal regions. Peripheral vascular calcifications are present. STOMACH AND BOWEL: Diffuse gaseous distention of visualized colon. IMPRESSION: 1. Questionable erosive changes and lucent lesions in the left inferior pubic ramus. Recommend further evaluation with CT. 2. Diffuse gaseous distention of visualized colon. Electronically signed by: Greig Pique MD 12/22/2023 05:19 PM EDT RP Workstation: [id number]

## 2024-01-17 NOTE — TOC Progression Note (Signed)
 Transition of Care [facility]) - Progression Note    Patient Details  Name: Peter Lambert MRN: [id number] Date of Birth: 1945-07-09  Transition of Care [facility] CM/SW Contact  Toy LITTIE Agar, RN Phone Number:[phone]  01/17/2024, 12:41 PM  Clinical Narrative:    CM at bedside to discuss home health, Daughter in law and sister at bedside with son on then phone. Family states that patient lives at home alone and is unable to walk. Family concerned that patient will not be safe at home with home health following and states that patient needs 24/7 care. CM has explained to family that insurance will not cover 24/7 care and that 24/7 care would be the responsibility of the family. Family is concerned that they will need a little time in rehab to figure things out. CM has explained that there is currently no recommendation and that CM can not move forward with short term rehab without a recommendation. Family verbalized understanding.    Expected Discharge Plan: Home w Home Health Services Barriers to Discharge: Continued Medical Work up               Expected Discharge Plan and Services In-house Referral: Clinical Social Work Discharge Planning Services: CM Consult   Living arrangements for the past 2 months: Single Family Home                                       Social Drivers of Health (SDOH) Interventions SDOH Screenings   Food Insecurity: No Food Insecurity (01/15/2024)  Housing: Low Risk  (01/15/2024)  Transportation Needs: No Transportation Needs (01/15/2024)  Utilities: Not At Risk (01/15/2024)  Depression (PHQ2-9): Low Risk  (12/27/2023)  Social Connections: Moderately Integrated (01/15/2024)  Tobacco Use: Medium Risk (01/13/2024)    Readmission Risk Interventions    01/14/2024    9:18 AM  Readmission Risk Prevention Plan  Transportation Screening Complete  HRI or Home Care Consult Complete  Social Work Consult for Recovery Care Planning/Counseling  Complete  Palliative Care Screening Not Applicable  Medication Review Ocean[employer]) Complete

## 2024-01-17 NOTE — Plan of Care (Signed)
  Problem: Education: Goal: Ability to describe self-care measures that may prevent or decrease complications (Diabetes Survival Skills Education) will improve Outcome: Progressing Goal: Individualized Educational Video(s) Outcome: Progressing   Problem: Coping: Goal: Ability to adjust to condition or change in health will improve Outcome: Progressing   Problem: Fluid Volume: Goal: Ability to maintain a balanced intake and output will improve Outcome: Progressing   Problem: Health Behavior/Discharge Planning: Goal: Ability to identify and utilize available resources and services will improve Outcome: Progressing Goal: Ability to manage health-related needs will improve Outcome: Progressing   Problem: Metabolic: Goal: Ability to maintain appropriate glucose levels will improve Outcome: Progressing   Problem: Nutritional: Goal: Maintenance of adequate nutrition will improve Outcome: Progressing Goal: Progress toward achieving an optimal weight will improve Outcome: Progressing   Problem: Tissue Perfusion: Goal: Adequacy of tissue perfusion will improve Outcome: Progressing   Problem: Education: Goal: Knowledge of General Education information will improve Description: Including pain rating scale, medication(s)/side effects and non-pharmacologic comfort measures Outcome: Progressing   Problem: Clinical Measurements: Goal: Ability to maintain clinical measurements within normal limits will improve Outcome: Progressing Goal: Will remain free from infection Outcome: Progressing Goal: Diagnostic test results will improve Outcome: Progressing Goal: Respiratory complications will improve Outcome: Progressing Goal: Cardiovascular complication will be avoided Outcome: Progressing   Problem: Activity: Goal: Risk for activity intolerance will decrease Outcome: Progressing   Problem: Coping: Goal: Level of anxiety will decrease Outcome: Progressing   Problem: Pain  Managment: Goal: General experience of comfort will improve and/or be controlled Outcome: Progressing   Problem: Safety: Goal: Ability to remain free from injury will improve Outcome: Progressing   Problem: Skin Integrity: Goal: Risk for impaired skin integrity will decrease Outcome: Progressing

## 2024-01-17 NOTE — Progress Notes (Signed)
 Occupational Therapy Treatment Patient Details Name: Peter Lambert MRN: [id number] DOB: [birth year] Today's Date: 01/17/2024   History of present illness Peter Lambert is a 78 y.o. male who was brought to ED due to declining over the last four weeks, pain in both feet, back, and L hip, weakness in LEs, and increased falls. MRI lumbar spine showed known widespread osseous metastases including a destructive 4 cm S1 lesion. No evidence of epidural tumor or spinal stenosis in the lumbar spine.  MRI thoracic spine showed known widespread osseous metastasis with pathology compression fracture with 10% height loss and T9 and 45% height loss at T11.  Epidural tumor at T11 resulted in the moderate spinal stenosis.  PMH: HTN, HLD, persistent a-fib on Eliquis , CAD s/p DES to LAD in 2010, V-fib arrest in 2021 in setting of occluded RCA, tobacco abuse PAD with remote history of aortofemoral bypass grafting   OT comments  Pt seen for OT treatment session focusing on LB dressing & review of spinal precautions to assist with comfort and pain reduction during ADL performance. Per pt, he has been experiencing increased pain with mobility. Secure chat to MD to discuss use of TLSO to aide in pain reduction, TLSO ordered and will trial in future sessions. Pt provided with DME/AE handouts and required MAX A with AE (sock aide, reacher) to return demo LB dressing. Pt states he has 24/7 assist from family at home upon discharge, will benefit from [facility] OT services. OT will follow acutely.       If plan is discharge home, recommend the following:  A little help with bathing/dressing/bathroom;Assistance with cooking/housework;Assist for transportation   Equipment Recommendations  Tub/shower seat       Precautions / Restrictions Precautions Precautions: Fall;Back Recall of Precautions/Restrictions: Intact Precaution/Restrictions Comments: impaired sensation in bilat LEs; reviewed log roll technique. Discussed with MD -  TLSO ordered for comfort to see if it helps with pain control. Required Braces or Orthoses: Spinal Brace Spinal Brace: Thoracolumbosacral orthotic Restrictions Weight Bearing Restrictions Per Provider Order: No       Mobility Bed Mobility Overal bed mobility: Needs Assistance Bed Mobility: Rolling, Sidelying to Sit, Sit to Sidelying Rolling: Used rails, Contact guard assist Sidelying to sit: Contact guard assist     Sit to sidelying: Contact guard assist General bed mobility comments: Verbal and manual cues for log roll technique    Transfers Overall transfer level: Needs assistance                 General transfer comment: NT     Balance Overall balance assessment: Needs assistance Sitting-balance support: Feet supported, Bilateral upper extremity supported Sitting balance-Leahy Scale: Fair Sitting balance - Comments: uses bilat UE to elevate self slightly to offweight bottom to aide in pain management                                   ADL either performed or assessed with clinical judgement   ADL Overall ADL's : Needs assistance/impaired                 Upper Body Dressing : Sitting;Set up Upper Body Dressing Details (indicate cue type and reason): doff and don gown Lower Body Dressing: Sitting/lateral leans;Maximal assistance;With adaptive equipment;Cueing for safety;Cueing for sequencing;Cueing for back precautions Lower Body Dressing Details (indicate cue type and reason): discussed LB dressing with spinal precautions, provided pt with edu and handouts for AE  use (reacher, sock aide, dressing stick). Demonstrated use of reacher for LB dressing, pt able to return demo with MAX A. Decreased FMC noted when manipulating sock aide.     Toileting- Clothing Manipulation and Hygiene: Maximal assistance;Bed level Toileting - Clothing Manipulation Details (indicate cue type and reason): pt on bedpan upon arrival. pt able to roll L<>R, MAX A from NT  for pericare       General ADL Comments: provided handouts for AE and DME. Discussed trial of TLSO for mobility progression and decreased back pain (TLSO ordered)    Extremity/Trunk Assessment              Vision       Perception     Praxis     Communication Communication Communication: No apparent difficulties   Cognition Arousal: Alert Behavior During Therapy: WFL for tasks assessed/performed Cognition: No apparent impairments                               Following commands: Intact        Cueing   Cueing Techniques: Verbal cues  Exercises Exercises: Other exercises Other Exercises Other Exercises: reviewed spinal precautions with pt including compensatory strategies for LB ADL performance Other Exercises: discussed use of TLSO with pt - pt states he has been having increased back pain with mobility. Pt agreeable to trial of TLSO to see if it improves comfort. Will trial next session,. Other Exercises: handout given for AE, DME and spinal precautions       General Comments Discussed with MD use of TLSO to aide in pain mgmt as chart review indicates pt with increased LBP during mobility. TLSO ordered.    Pertinent Vitals/ Pain       Pain Assessment Pain Assessment: Faces Faces Pain Scale: Hurts little more Pain Location: low back pain, primary L side, increased with sitting Pain Descriptors / Indicators: Aching Pain Intervention(s): Limited activity within patient's tolerance, Monitored during session, Repositioned   Frequency  Min 2X/week        Progress Toward Goals  OT Goals(current goals can now be found in the care plan section)  Progress towards OT goals: Progressing toward goals  Acute Rehab OT Goals OT Goal Formulation: With patient Time For Goal Achievement: 01/29/24 Potential to Achieve Goals: Good ADL Goals Pt Will Transfer to Toilet: with modified independence;ambulating;regular height toilet Pt Will Perform Toileting -  Clothing Manipulation and hygiene: with modified independence;sit to/from stand;sitting/lateral leans Pt/caregiver will Perform Home Exercise Program: Increased strength;Both right and left upper extremity;With theraband;Independently;With written HEP provided Additional ADL Goal #1: Pt will be educated and verbalize independence with pain management techniques and activity modifications that will allow him to complete ADL tasks with increased comfort and pain to remain at a tolerable level.  Plan         AM-PAC OT 6 Clicks Daily Activity     Outcome Measure   Help from another person eating meals?: None Help from another person taking care of personal grooming?: None Help from another person toileting, which includes using toliet, bedpan, or urinal?: A Little Help from another person bathing (including washing, rinsing, drying)?: A Little Help from another person to put on and taking off regular upper body clothing?: None Help from another person to put on and taking off regular lower body clothing?: A Little 6 Click Score: 21    End of Session Equipment Utilized During Treatment: Oxygen   OT  Visit Diagnosis: Muscle weakness (generalized) (M62.81);Pain   Activity Tolerance Patient limited by pain   Patient Left in bed;with call bell/phone within reach;with bed alarm set   Nurse Communication Mobility status        Time: 8475-8448 OT Time Calculation (min): 27 min  Charges: OT General Charges $OT Visit: 1 Visit OT Treatments $Self Care/Home Management : 23-37 mins  Roshonda Sperl L. Mikeria Valin, OTR/L  01/17/24, 5:27 PM

## 2024-01-17 NOTE — Progress Notes (Signed)
 PROGRESS NOTE    Peter Lambert  FMW:[id number] DOB: November 27, 1945 DOA: 01/13/2024 PCP: Marvine Rush, MD    Brief Narrative:  78 year old with history of tobacco use, HLD, HTN, DM 2, persistent A-fib on Eliquis , CAD with PCI 2010, V-fib arrest 2021 from occluded RCA status post ICD, PAD with history of aortofemoral bypass grafting came to the hospital with functional decline over the last several weeks and worsening back pain.  He was recently diagnosed of lung cancer, underwent bronchoscopy on 11/11 and found to have squamous cell cancer.   Assessment & Plan:  Squamous cell carcinoma of lung with bone and spine metastases Compression thoracic spine fractures, pathologic -Patient underwent bronchoscopy 11/11 showing lung mass and biopsy consistent with squamous cell carcinoma.  Patient follows at [facility].  Over the last several days he has had functional decline with lower back pain and weakness.  MRI of thoracic and lumbar spine showed widespread spinal lesions with pathologic compression of T9 and T11 and epidural tumor at T11 with moderate spinal stenosis and a sacral lesion.  Patient was seen by neurosurgery who recommended against surgical intervention at this point  -Radiation oncology consulted = CT sim done, started radiation on 11/21 -Plan to place [place]. MRI Brain will be outptn (will need to be scheduled due to AICD)  Hypercalcemia of malignancy In the setting of underlying malignancy.  Initial calcium  over 12.  After Zometa  and IV fluids this is improved.  Will continue to daily monitor calcium  levels   Hypomagnesemia  - Replete and continue to monitor   Persistent atrial fibrillation  -On Eliquis , Coreg .  IV as needed   Essential hypertension  -On Coreg .  IV as needed   COPD  -Currently stable.  Will order as needed bronchodilators   Coronary disease status post PCI Overall appears to be stable, does not have any chest pain.  Will continue Eliquis ,  statin and Coreg    Diabetes mellitus type 2  -continue sliding scale    Chronic HFpEF  -Clinically appears to be euvolemic.  Echocardiogram on 01/02/2024 showed preserved EF.   Mixed hyperlipidemia  -Continue Lipitor    Hypothyroidism  -Continue Synthroid    Tobacco abuse -  Tobacco cessation discussed   DVT prophylaxis: SCDs Start: 01/14/24 0146 apixaban  (ELIQUIS ) tablet 5 mg      Code Status: Full Code Family Communication:   Status is: Inpatient Remains inpatient appropriate because: On going radiation   PT Follow up Recs: Home Health Pt11/19/2025 0800  Subjective: DIL and daughter at bedside Radiation today    Examination:  General exam: Appears calm and comfortable  Respiratory system: Clear to auscultation. Respiratory effort normal. Cardiovascular system: S1 & S2 heard, RRR. No JVD, murmurs, rubs, gallops or clicks. No pedal edema. Gastrointestinal system: Abdomen is nondistended, soft and nontender. No organomegaly or masses felt. Normal bowel sounds heard. Central nervous system: Alert and oriented. No focal neurological deficits. Extremities: Symmetric 5 x 5 power. Skin: No rashes, lesions or ulcers Psychiatry: Judgement and insight appear normal. Mood & affect appropriate.            Wound 01/14/24 1830 Pressure Injury Sacrum Bilateral Stage 2 -  Partial thickness loss of dermis presenting as a shallow open injury with a red, pink wound bed without slough. (Active)     Diet Orders (From admission, onward)     Start     Ordered   01/17/24 0001  Diet NPO time specified Except for: Sips with Meds  Diet effective midnight  Comments:    Question:  Except for  Answer:  Sips with Meds   01/16/24 1645            Objective: Vitals:   01/17/24 1105 01/17/24 1110 01/17/24 1120 01/17/24 1210  BP: (!) 89/57 97/61 (!) 93/56 95/62  Pulse: 70 72 69 71  Resp: 15 16 10 20   Temp:    (!) 96.8 F (36 C)  TempSrc:      SpO2: 100% 98% 98% 100%   Weight:      Height:        Intake/Output Summary (Last 24 hours) at 01/17/2024 1249 Last data filed at 01/17/2024 0600 Gross per 24 hour  Intake --  Output 550 ml  Net -550 ml   Filed Weights   01/13/24 1429  Weight: 64.4 kg    Scheduled Meds:  [START ON 01/18/2024] apixaban   5 mg Oral BID   atorvastatin   80 mg Oral Daily   carvedilol   3.125 mg Oral Q breakfast   dexamethasone   4 mg Oral Q8H   feeding supplement  237 mL Oral TID BM   insulin  aspart  0-9 Units Subcutaneous TID WC   levothyroxine   50 mcg Oral QAC breakfast   multivitamin with minerals  1 tablet Oral Daily   Continuous Infusions:  Nutritional status Signs/Symptoms: estimated needs Interventions: Ensure Enlive (each supplement provides 350kcal and 20 grams of protein), Magic cup, MVI Body mass index is 21.59 kg/m.  Data Reviewed:   CBC: Recent Labs  Lab 01/13/24 1505 01/14/24 0232 01/15/24 0405 01/17/24 0029  WBC 14.6* 11.5* 15.2* 14.0*  HGB 10.6* 9.9* 9.0* 8.4*  HCT 33.7* 32.3* 28.4* 27.6*  MCV 84.0 85.0 81.8 84.1  PLT 365 281 276 258   Basic Metabolic Panel: Recent Labs  Lab 01/13/24 1505 01/14/24 0232 01/15/24 0405 01/16/24 0854 01/17/24 0029 01/17/24 0032  NA 139 138 135 133*  --  133*  K 4.0 4.2 4.6 4.7  --  4.9  CL 98 98 100 98  --  98  CO2 30 30 25 27   --  25  GLUCOSE 133* 119* 134* 166*  --  205*  BUN 20 21 29* 32*  --  36*  CREATININE 1.15 1.17 1.22 1.24  --  1.25*  CALCIUM  11.9* 11.6* 10.5* 10.8*  --  10.0  MG  --  1.6* 1.7  --  1.8  --   PHOS  --  2.7  --   --   --  2.6   GFR: Estimated Creatinine Clearance: 42.9 mL/min (A) (by C-G formula based on SCr of 1.25 mg/dL (H)). Liver Function Tests: Recent Labs  Lab 01/13/24 1505 01/14/24 0232 01/15/24 0405 01/16/24 0854 01/17/24 0032  AST 26 25 22 27  32  ALT 23 19 24 29  36  ALKPHOS 111 99 72 89 100  BILITOT 0.5 0.5 0.6 0.3 0.3  PROT 7.2 6.7 6.0* 6.5 6.1*  ALBUMIN 3.5 3.3* 2.3* 3.3* 3.1*   No results for  input(s): LIPASE, AMYLASE in the last 168 hours. No results for input(s): AMMONIA in the last 168 hours. Coagulation Profile: No results for input(s): INR, PROTIME in the last 168 hours. Cardiac Enzymes: No results for input(s): CKTOTAL, CKMB, CKMBINDEX, TROPONINI in the last 168 hours. BNP (last 3 results) No results for input(s): PROBNP in the last 8760 hours. HbA1C: No results for input(s): HGBA1C in the last 72 hours. CBG: Recent Labs  Lab 01/16/24 1138 01/16/24 1625 01/16/24 2216 01/17/24 0746 01/17/24 1206  GLUCAP 189* 203* 192* 158* 117*   Lipid Profile: No results for input(s): CHOL, HDL, LDLCALC, TRIG, CHOLHDL, LDLDIRECT in the last 72 hours. Thyroid  Function Tests: Recent Labs    01/16/24 0854  TSH 1.250   Anemia Panel: No results for input(s): VITAMINB12, FOLATE, FERRITIN, TIBC, IRON, RETICCTPCT in the last 72 hours. Sepsis Labs: No results for input(s): PROCALCITON, LATICACIDVEN in the last 168 hours.  No results found for this or any previous visit (from the past 240 hours).       Radiology Studies: No results found.         LOS: 4 days   Time spent= 35 mins    Burgess JAYSON Dare, MD [facility]  If 7PM-7AM, please contact night-coverage  01/17/2024, 12:49 PM

## 2024-01-18 DIAGNOSIS — R29898 Other symptoms and signs involving the musculoskeletal system: Secondary | ICD-10-CM | POA: Diagnosis not present

## 2024-01-18 LAB — GLUCOSE, CAPILLARY
Glucose-Capillary: 154 mg/dL — ABNORMAL HIGH (ref 70–99)
Glucose-Capillary: 167 mg/dL — ABNORMAL HIGH (ref 70–99)
Glucose-Capillary: 156 mg/dL — ABNORMAL HIGH (ref 70–99)
Glucose-Capillary: 197 mg/dL — ABNORMAL HIGH (ref 70–99)

## 2024-01-18 LAB — COMPREHENSIVE METABOLIC PANEL WITH GFR
ALT: 40 U/L (ref 0–44)
AST: 28 U/L (ref 15–41)
Albumin: 3.3 g/dL — ABNORMAL LOW (ref 3.5–5.0)
Alkaline Phosphatase: 90 U/L (ref 38–126)
Anion gap: 8 (ref 5–15)
BUN: 40 mg/dL — ABNORMAL HIGH (ref 8–23)
CO2: 27 mmol/L (ref 22–32)
Calcium: 9.6 mg/dL (ref 8.9–10.3)
Chloride: 100 mmol/L (ref 98–111)
Creatinine, Ser: 1.24 mg/dL (ref 0.61–1.24)
GFR, Estimated: 59 mL/min — ABNORMAL LOW (ref >60)
Glucose, Bld: 180 mg/dL — ABNORMAL HIGH (ref 70–99)
Potassium: 4.7 mmol/L (ref 3.5–5.1)
Sodium: 134 mmol/L — ABNORMAL LOW (ref 135–145)
Total Bilirubin: <0.2 mg/dL (ref 0.0–1.2)
Total Protein: 6.2 g/dL — ABNORMAL LOW (ref 6.5–8.1)

## 2024-01-18 MED ORDER — SODIUM CHLORIDE 0.9% FLUSH
10.0000 mL | Freq: Two times a day (BID) | INTRAVENOUS | Status: DC
  Administered 2024-01-18 – 2024-01-19 (×3): 10 mL

## 2024-01-18 MED ORDER — CHLORHEXIDINE GLUCONATE CLOTH 2 % EX PADS
6.0000 | MEDICATED_PAD | Freq: Every day | CUTANEOUS | Status: DC
  Administered 2024-01-18 – 2024-01-19 (×2): 6 via TOPICAL

## 2024-01-18 MED ORDER — ALUM & MAG HYDROXIDE-SIMETH 200-200-20 MG/5ML PO SUSP: 30.0000 mL | Freq: Four times a day (QID) | ORAL | Status: DC | PRN

## 2024-01-18 MED ORDER — SODIUM CHLORIDE 0.9% FLUSH: 10.0000 mL | INTRAVENOUS | Status: DC | PRN

## 2024-01-18 NOTE — Progress Notes (Signed)
 Not able to document on patients port a cath in flowsheets  Accessed Flushed; Saline Locked w/ blood return Connections checked and tightened  Dressing: Assessed, clean dry and intact

## 2024-01-18 NOTE — Progress Notes (Addendum)
 PROGRESS NOTE    Peter Lambert  FMW:[id number] DOB: 30-Nov-1945 DOA: 01/13/2024 PCP: Marvine Rush, MD    Brief Narrative:  78 year old with history of tobacco use, HLD, HTN, DM 2, persistent A-fib on Eliquis , CAD with PCI 2010, V-fib arrest 2021 from occluded RCA status post ICD, PAD with history of aortofemoral bypass grafting came to the hospital with functional decline over the last several weeks and worsening back pain.  He was recently diagnosed of lung cancer, underwent bronchoscopy on 11/11 and found to have squamous cell cancer. Started On Radiation on 11/21   Assessment & Plan:  Squamous cell carcinoma of lung with bone and spine metastases Compression thoracic spine fractures, pathologic -Patient underwent bronchoscopy 11/11 showing lung mass and biopsy consistent with squamous cell carcinoma.  Patient follows at [facility] cancer center.  Over the last several days he has had functional decline with lower back pain and weakness.  MRI of thoracic and lumbar spine showed widespread spinal lesions with pathologic compression of T9 and T11 and epidural tumor at T11 with moderate spinal stenosis and a sacral lesion.  Patient was seen by neurosurgery who recommended against surgical intervention at this point  -Radiation oncology consulted = CT sim done, started radiation on 11/21, planned until 11/26. -Chemo-Port placed 11/21. -MRI brain with contrast for staging to be scheduled outpatient.  Patient has AICD in place  Hypercalcemia of malignancy In the setting of underlying malignancy.  Initial calcium  over 12.  After Zometa  and IV fluids this is improved.  Will continue to daily monitor calcium  levels   Hypomagnesemia  - Replete and continue to monitor   Persistent atrial fibrillation  -On Eliquis , Coreg .  IV as needed   Essential hypertension  -On Coreg .  IV as needed   COPD  -Currently stable.  Will order as needed bronchodilators   Coronary disease status post  PCI Overall appears to be stable, does not have any chest pain.  Will continue Eliquis , statin and Coreg    Diabetes mellitus type 2  -continue sliding scale    Chronic HFpEF  -Clinically appears to be euvolemic.  Echocardiogram on 01/02/2024 showed preserved EF.   Mixed hyperlipidemia  -Continue Lipitor    Hypothyroidism  -Continue Synthroid    Tobacco abuse -  Tobacco cessation discussed   DVT prophylaxis:Eliquis      Code Status: Full Code Family Communication:   Status is: Inpatient Remains inpatient appropriate because: On going radiation   PT Follow up Recs: Home Health Pt11/19/2025 0800  Subjective:  Feeling great no complaints Wishing to go home.    Examination:  General exam: Appears calm and comfortable ; cachectic frail Respiratory system: Clear to auscultation. Respiratory effort normal. Cardiovascular system: S1 & S2 heard, RRR. No JVD, murmurs, rubs, gallops or clicks. No pedal edema. Gastrointestinal system: Abdomen is nondistended, soft and nontender. No organomegaly or masses felt. Normal bowel sounds heard. Central nervous system: Alert and oriented. No focal neurological deficits. Extremities: Symmetric 5 x 5 power. Skin: No rashes, lesions or ulcers Psychiatry: Judgement and insight appear normal. Mood & affect appropriate.            Wound 01/14/24 1830 Pressure Injury Sacrum Bilateral Stage 2 -  Partial thickness loss of dermis presenting as a shallow open injury with a red, pink wound bed without slough. (Active)     Diet Orders (From admission, onward)     Start     Ordered   01/17/24 1415  Diet regular Room service appropriate? Yes; Fluid consistency: Thin  Diet effective now       Question Answer Comment  Room service appropriate? Yes   Fluid consistency: Thin      01/17/24 1415            Objective: Vitals:   01/17/24 1357 01/17/24 2052 01/18/24 0533 01/18/24 0831  BP: (!) 111/50 (!) 103/54 118/64 119/68  Pulse: 70  70 74 60  Resp: 20 20 16    Temp: 97.7 F (36.5 C) 98.2 F (36.8 C) (!) 97.3 F (36.3 C)   TempSrc:  Oral Oral   SpO2: 100% 99% 100%   Weight:      Height:       No intake or output data in the 24 hours ending 01/18/24 1136 Filed Weights   01/13/24 1429  Weight: 64.4 kg    Scheduled Meds:  acetaminophen   1,000 mg Oral TID   apixaban   5 mg Oral BID   atorvastatin   80 mg Oral Daily   carvedilol   3.125 mg Oral Q breakfast   Chlorhexidine  Gluconate Cloth  6 each Topical Daily   dexamethasone   4 mg Oral Q8H   feeding supplement  237 mL Oral TID BM   insulin  aspart  0-9 Units Subcutaneous TID WC   levothyroxine   50 mcg Oral QAC breakfast   multivitamin with minerals  1 tablet Oral Daily   sodium chloride  flush  10-40 mL Intracatheter Q12H   Continuous Infusions:  Nutritional status Signs/Symptoms: estimated needs Interventions: Ensure Enlive (each supplement provides 350kcal and 20 grams of protein), Magic cup, MVI Body mass index is 21.59 kg/m.  Data Reviewed:   CBC: Recent Labs  Lab 01/13/24 1505 01/14/24 0232 01/15/24 0405 01/17/24 0029  WBC 14.6* 11.5* 15.2* 14.0*  HGB 10.6* 9.9* 9.0* 8.4*  HCT 33.7* 32.3* 28.4* 27.6*  MCV 84.0 85.0 81.8 84.1  PLT 365 281 276 258   Basic Metabolic Panel: Recent Labs  Lab 01/14/24 0232 01/15/24 0405 01/16/24 0854 01/17/24 0029 01/17/24 0032 01/18/24 0434  NA 138 135 133*  --  133* 134*  K 4.2 4.6 4.7  --  4.9 4.7  CL 98 100 98  --  98 100  CO2 30 25 27   --  25 27  GLUCOSE 119* 134* 166*  --  205* 180*  BUN 21 29* 32*  --  36* 40*  CREATININE 1.17 1.22 1.24  --  1.25* 1.24  CALCIUM  11.6* 10.5* 10.8*  --  10.0 9.6  MG 1.6* 1.7  --  1.8  --   --   PHOS 2.7  --   --   --  2.6  --    GFR: Estimated Creatinine Clearance: 43.3 mL/min (by C-G formula based on SCr of 1.24 mg/dL). Liver Function Tests: Recent Labs  Lab 01/14/24 0232 01/15/24 0405 01/16/24 0854 01/17/24 0032 01/18/24 0434  AST 25 22 27  32 28  ALT  19 24 29  36 40  ALKPHOS 99 72 89 100 90  BILITOT 0.5 0.6 0.3 0.3 <0.2  PROT 6.7 6.0* 6.5 6.1* 6.2*  ALBUMIN 3.3* 2.3* 3.3* 3.1* 3.3*   No results for input(s): LIPASE, AMYLASE in the last 168 hours. No results for input(s): AMMONIA in the last 168 hours. Coagulation Profile: No results for input(s): INR, PROTIME in the last 168 hours. Cardiac Enzymes: No results for input(s): CKTOTAL, CKMB, CKMBINDEX, TROPONINI in the last 168 hours. BNP (last 3 results) No results for input(s): PROBNP in the last 8760 hours. HbA1C: No results for input(s): HGBA1C in  the last 72 hours. CBG: Recent Labs  Lab 01/17/24 0746 01/17/24 1206 01/17/24 1738 01/17/24 2057 01/18/24 0717  GLUCAP 158* 117* 160* 238* 154*   Lipid Profile: No results for input(s): CHOL, HDL, LDLCALC, TRIG, CHOLHDL, LDLDIRECT in the last 72 hours. Thyroid  Function Tests: Recent Labs    01/16/24 0854  TSH 1.250   Anemia Panel: No results for input(s): VITAMINB12, FOLATE, FERRITIN, TIBC, IRON, RETICCTPCT in the last 72 hours. Sepsis Labs: No results for input(s): PROCALCITON, LATICACIDVEN in the last 168 hours.  No results found for this or any previous visit (from the past 240 hours).       Radiology Studies: IR IMAGING GUIDED PORT INSERTION Result Date: 01/17/2024 CLINICAL DATA:  Metastatic squamous cell cancer of the lung. EXAM: Chest port catheter placement TECHNIQUE: Procedure performed using fluoroscopy and ultrasound CONTRAST:  None RADIOPHARMACEUTICALS:  None FLUOROSCOPY: 1 mGy COMPARISON:  None FINDINGS: The patient was placed in supine position on the IR gantry and the right upper chest and neck were prepped and draped in the usual sterile fashion. The nurse administered intravenous fentanyl  and Versed  under my supervision and the nurse had no other injuries other than monitoring the patient and administering medications. I was present for the entire duration  of procedure. 2 mg intravenous Versed  and 100 mcg intravenous fentanyl  for a total sedation time of 60 minutes. Ultrasound guidance was used to investigate the right internal jugular vein which was anechoic and compressible indicating patency. The needle was then advanced from a scan negative through the soft tissue into the right internal jugular vein under ultrasound guidance. A final image was obtained and stored in the patient's permanent medical record. Access was then exchanged over a guidewire which was advanced under fluoroscopic guidance. The needle was removed and replaced with a micropuncture sheath. Approximately 2 inches below the clavicle the port pocket was created with a subsequent incision. The catheter was then tunneled from the port pocket to the venotomy site overlying the right internal jugular vein. Access was then exchanged over an 035 guidewire for peel-away sheath which was advanced over the guidewire under fluoroscopic guidance. The catheter was then advanced through the peel-away sheath to the sinoatrial junction. Sheath was removed. The catheter was then cut at the port pocket and connected to chest port. The chest port was tested for function and finally function well. The chest port was then flushed with heparin  and a port pocket was closed with 4-0 suture. Final image was obtained demonstrating satisfactory position of chest port. The final count of all materials was satisfactory. IMPRESSION: 1. Satisfactory placement of right internal jugular vein single-lumen chest port. Catheter tip is at the cavoatrial junction. 2.  Okay to use and power inject chest port. Electronically Signed   By: Cordella Banner   On: 01/17/2024 15:51           LOS: 5 days   Time spent= 35 mins    Burgess JAYSON Dare, MD [facility]  If 7PM-7AM, please contact night-coverage  01/18/2024, 11:36 AM

## 2024-01-18 NOTE — Plan of Care (Signed)

## 2024-01-18 NOTE — Plan of Care (Signed)
  Problem: Education: Goal: Ability to describe self-care measures that may prevent or decrease complications (Diabetes Survival Skills Education) will improve Outcome: Progressing Goal: Individualized Educational Video(s) Outcome: Progressing   Problem: Coping: Goal: Ability to adjust to condition or change in health will improve Outcome: Progressing   Problem: Fluid Volume: Goal: Ability to maintain a balanced intake and output will improve Outcome: Progressing   Problem: Health Behavior/Discharge Planning: Goal: Ability to identify and utilize available resources and services will improve Outcome: Progressing Goal: Ability to manage health-related needs will improve Outcome: Progressing   Problem: Metabolic: Goal: Ability to maintain appropriate glucose levels will improve Outcome: Progressing   Problem: Nutritional: Goal: Maintenance of adequate nutrition will improve Outcome: Progressing Goal: Progress toward achieving an optimal weight will improve Outcome: Progressing   Problem: Tissue Perfusion: Goal: Adequacy of tissue perfusion will improve Outcome: Progressing   Problem: Education: Goal: Knowledge of General Education information will improve Description: Including pain rating scale, medication(s)/side effects and non-pharmacologic comfort measures Outcome: Progressing   Problem: Health Behavior/Discharge Planning: Goal: Ability to manage health-related needs will improve Outcome: Progressing   Problem: Clinical Measurements: Goal: Ability to maintain clinical measurements within normal limits will improve Outcome: Progressing Goal: Will remain free from infection Outcome: Progressing Goal: Diagnostic test results will improve Outcome: Progressing Goal: Respiratory complications will improve Outcome: Progressing Goal: Cardiovascular complication will be avoided Outcome: Progressing   Problem: Activity: Goal: Risk for activity intolerance will  decrease Outcome: Progressing   Problem: Nutrition: Goal: Adequate nutrition will be maintained Outcome: Progressing   Problem: Coping: Goal: Level of anxiety will decrease Outcome: Progressing   Problem: Elimination: Goal: Will not experience complications related to bowel motility Outcome: Progressing Goal: Will not experience complications related to urinary retention Outcome: Progressing   Problem: Pain Managment: Goal: General experience of comfort will improve and/or be controlled Outcome: Progressing   Problem: Safety: Goal: Ability to remain free from injury will improve Outcome: Progressing   Problem: Skin Integrity: Goal: Risk for impaired skin integrity will decrease Outcome: Progressing

## 2024-01-18 NOTE — TOC Progression Note (Signed)
 Transition of Care [facility]) - Progression Note    Patient Details  Name: ACHILLES NEVILLE MRN: [id number] Date of Birth: 1945/07/11  Transition of Care [facility]) CM/SW Contact  NORMAN ASPEN, LCSW Phone Number: 01/18/2024, 11:45 AM  Clinical Narrative:     HHPT and OT secured with [facility]   Expected Discharge Plan: Home w Home Health Services Barriers to Discharge: Continued Medical Work up               Expected Discharge Plan and Services In-house Referral: Clinical Social Work Discharge Planning Services: CM Consult   Living arrangements for the past 2 months: Single Family Home                                       Social Drivers of Health (SDOH) Interventions SDOH Screenings   Food Insecurity: No Food Insecurity (01/15/2024)  Housing: Low Risk  (01/15/2024)  Transportation Needs: No Transportation Needs (01/15/2024)  Utilities: Not At Risk (01/15/2024)  Depression (PHQ2-9): Low Risk  (12/27/2023)  Social Connections: Moderately Integrated (01/15/2024)  Tobacco Use: Medium Risk (01/13/2024)    Readmission Risk Interventions    01/14/2024    9:18 AM  Readmission Risk Prevention Plan  Transportation Screening Complete  HRI or Home Care Consult Complete  Social Work Consult for Recovery Care Planning/Counseling Complete  Palliative Care Screening Not Applicable  Medication Review Oceanographer) Complete

## 2024-01-19 ENCOUNTER — Ambulatory Visit (HOSPITAL_COMMUNITY)

## 2024-01-19 ENCOUNTER — Other Ambulatory Visit: Payer: Self-pay

## 2024-01-19 ENCOUNTER — Other Ambulatory Visit (HOSPITAL_COMMUNITY): Payer: Self-pay

## 2024-01-19 ENCOUNTER — Ambulatory Visit

## 2024-01-19 DIAGNOSIS — R29898 Other symptoms and signs involving the musculoskeletal system: Secondary | ICD-10-CM | POA: Diagnosis not present

## 2024-01-19 LAB — COMPREHENSIVE METABOLIC PANEL WITH GFR
ALT: 48 U/L — ABNORMAL HIGH (ref 0–44)
AST: 29 U/L (ref 15–41)
Albumin: 3.1 g/dL — ABNORMAL LOW (ref 3.5–5.0)
Alkaline Phosphatase: 85 U/L (ref 38–126)
Anion gap: 7 (ref 5–15)
BUN: 38 mg/dL — ABNORMAL HIGH (ref 8–23)
CO2: 27 mmol/L (ref 22–32)
Calcium: 9.2 mg/dL (ref 8.9–10.3)
Chloride: 101 mmol/L (ref 98–111)
Creatinine, Ser: 1.12 mg/dL (ref 0.61–1.24)
GFR, Estimated: >60 mL/min (ref >60)
Glucose, Bld: 144 mg/dL — ABNORMAL HIGH (ref 70–99)
Potassium: 4.6 mmol/L (ref 3.5–5.1)
Sodium: 135 mmol/L (ref 135–145)
Total Bilirubin: 0.3 mg/dL (ref 0.0–1.2)
Total Protein: 6.1 g/dL — ABNORMAL LOW (ref 6.5–8.1)

## 2024-01-19 LAB — RAD ONC ARIA SESSION SUMMARY
Course Elapsed Days: 2
Plan Fractions Treated to Date: 2
Plan Fractions Treated to Date: 2
Plan Fractions Treated to Date: 2
Plan Prescribed Dose Per Fraction: 4 Gy
Plan Prescribed Dose Per Fraction: 4 Gy
Plan Prescribed Dose Per Fraction: 4 Gy
Plan Total Fractions Prescribed: 5
Plan Total Fractions Prescribed: 5
Plan Total Fractions Prescribed: 5
Plan Total Prescribed Dose: 20 Gy
Plan Total Prescribed Dose: 20 Gy
Plan Total Prescribed Dose: 20 Gy
Reference Point Dosage Given to Date: 8 Gy
Reference Point Dosage Given to Date: 8 Gy
Reference Point Dosage Given to Date: 8 Gy
Reference Point Session Dosage Given: 4 Gy
Reference Point Session Dosage Given: 4 Gy
Reference Point Session Dosage Given: 4 Gy
Session Number: 2

## 2024-01-19 LAB — GLUCOSE, CAPILLARY
Glucose-Capillary: 144 mg/dL — ABNORMAL HIGH (ref 70–99)
Glucose-Capillary: 141 mg/dL — ABNORMAL HIGH (ref 70–99)

## 2024-01-19 MED ORDER — ENSURE PLUS HIGH PROTEIN PO LIQD: 237.0000 mL | Freq: Three times a day (TID) | ORAL | Status: DC

## 2024-01-19 MED ORDER — ALBUTEROL SULFATE HFA 108 (90 BASE) MCG/ACT IN AERS
2.0000 | INHALATION_SPRAY | Freq: Four times a day (QID) | RESPIRATORY_TRACT | 2 refills | Status: DC | PRN
  Filled 2024-01-19: qty 13.4, 30d supply, fill #0

## 2024-01-19 MED ORDER — DEXAMETHASONE 4 MG PO TABS
ORAL_TABLET | ORAL | 0 refills | Status: DC
  Filled 2024-01-19: qty 24, 11d supply, fill #0

## 2024-01-19 MED ORDER — ADULT MULTIVITAMIN W/MINERALS CH: 1.0000 | ORAL_TABLET | Freq: Every day | ORAL | Status: DC

## 2024-01-19 MED ORDER — POLYETHYLENE GLYCOL 3350 17 GM/SCOOP PO POWD
17.0000 g | Freq: Every day | ORAL | 0 refills | Status: DC | PRN
  Filled 2024-01-19: qty 238, 14d supply, fill #0

## 2024-01-19 MED ORDER — SENNOSIDES-DOCUSATE SODIUM 8.6-50 MG PO TABS
1.0000 | ORAL_TABLET | Freq: Every day | ORAL | 0 refills | Status: DC
  Filled 2024-01-19: qty 30, 30d supply, fill #0

## 2024-01-19 MED ORDER — ACETAMINOPHEN 500 MG PO TABS: 1000.0000 mg | ORAL_TABLET | Freq: Three times a day (TID) | ORAL | Status: DC

## 2024-01-19 MED ORDER — OXYCODONE HCL 5 MG PO TABS
5.0000 mg | ORAL_TABLET | ORAL | 0 refills | Status: DC | PRN
  Filled 2024-01-19: qty 50, 5d supply, fill #0

## 2024-01-19 MED ORDER — HEPARIN SOD (PORK) LOCK FLUSH 100 UNIT/ML IV SOLN
500.0000 [IU] | Freq: Once | INTRAVENOUS | Status: AC
  Administered 2024-01-19: 500 [IU] via INTRAVENOUS
  Filled 2024-01-19: qty 5

## 2024-01-19 NOTE — Progress Notes (Signed)
 Port de-accessed

## 2024-01-19 NOTE — TOC Progression Note (Signed)
 Transition of Care [facility]) - Progression Note    Patient Details  Name: Peter Lambert MRN: [id number] Date of Birth: 15-Jul-1945  [facility] of [facility]) CM/SW Contact  Lorraine LILLETTE Fenton, [place] Phone Number: 01/19/2024, 9:50 AM  Clinical Narrative:    [place] from RN, son [name] would like a call to verify HHPT provider. CSW called darrell verified accepted for HHPT and that paperwork will include contact information. He asked that he be moved to first contact as they have not heard from brother [name].  Pt DC today with HHPT.   Expected Discharge Plan: Home w Home Health Services Barriers to Discharge: No Barriers Identified               Expected Discharge Plan and Services In-house Referral: Clinical Social Work Discharge Planning Services: CM Consult   Living arrangements for the past 2 months: Single Family Home Expected Discharge Date: 01/19/24                                     Social Drivers of Health (SDOH) Interventions SDOH Screenings   Food Insecurity: No Food Insecurity (01/15/2024)  Housing: Low Risk  (01/15/2024)  Transportation Needs: No Transportation Needs (01/15/2024)  Utilities: Not At Risk (01/15/2024)  Depression (PHQ2-9): Low Risk  (12/27/2023)  Social Connections: Moderately Integrated (01/15/2024)  Tobacco Use: Medium Risk (01/13/2024)    Readmission Risk Interventions    01/14/2024    9:18 AM  Readmission Risk Prevention Plan  Transportation Screening Complete  HRI or Home Care Consult Complete  Social Work Consult for Recovery Care Planning/Counseling Complete  Palliative Care Screening Not Applicable  Medication Review Oceanographer) Complete

## 2024-01-19 NOTE — Discharge Summary (Signed)
 Physician Discharge Summary  Peter Lambert FMW:[id number] DOB: 13-Aug-1945 DOA: 01/13/2024  PCP: Marvine Rush, MD  Admit date: 01/13/2024 Discharge date: 01/19/2024  Admitted From: Home  Disposition: Home  Recommendations for Outpatient Follow-up:  Follow up with PCP in 1-2 weeks Please obtain BMP/CBC in one week your next doctors visit.  Continue outpatient radiation Taper regimen of Decadron  has been prescribed Bowel regimen with pain medications Albuterol  as needed For now discontinue Aldactone , Lasix  and potassium supplements.  This can be reassessed outpatient   Discharge Condition: Stable CODE STATUS: Full code Diet recommendation: Diabetic  Brief/Interim Summary: Brief Narrative:  78 year old with history of tobacco use, HLD, HTN, DM 2, persistent A-fib on Eliquis , CAD with PCI 2010, V-fib arrest 2021 from occluded RCA status post ICD, PAD with history of aortofemoral bypass grafting came to the hospital with functional decline over the last several weeks and worsening back pain.  He was recently diagnosed of lung cancer, underwent bronchoscopy on 11/11 and found to have squamous cell cancer. Started On Radiation on 11/21 with plans to continue this until 11/26 which can be done outpatient.  Chemo-Port was placed on 11/21.  Physical therapy recommended home health services which was arranged.  Patient tolerated chemo well.  Physical therapy is recommending home health services therefore arrangements will be made.  Will discharge the patient today in stable condition. Son Auston has been updated by me on the day of discharge as well.  They are very thankful   Assessment & Plan:  Squamous cell carcinoma of lung with bone and spine metastases Compression thoracic spine fractures, pathologic -Patient underwent bronchoscopy 11/11 showing lung mass and biopsy consistent with squamous cell carcinoma.  Patient follows at [facility] cancer center.  Over the last several days he  has had functional decline with lower back pain and weakness.  MRI of thoracic and lumbar spine showed widespread spinal lesions with pathologic compression of T9 and T11 and epidural tumor at T11 with moderate spinal stenosis and a sacral lesion.  Patient was seen by neurosurgery who recommended against surgical intervention at this point  -Radiation oncology consulted = CT sim done, started radiation on 11/21, planned until 11/26.  Family to bring him to the patient appointment for remainder of his planned radiation treatments -Chemo-Port placed 11/21. -MRI brain with contrast for staging to be scheduled outpatient.  Patient has AICD in place -Pain medication, bowel regimen and bronchodilators have been prescribed  Hypercalcemia of malignancy, improved In the setting of underlying malignancy.  Initial calcium  over 12.  After Zometa  and IV fluids this is improved.  Will continue to daily monitor calcium  levels   Hypomagnesemia, improved - Replete and continue to monitor   Persistent atrial fibrillation  -On Eliquis , Coreg .  F   Essential hypertension  -On Coreg .  For now we are holding Lasix  and Aldactone  along with potassium supplements while he has poor oral intake.  This can be reassessed outpatient   COPD  -Currently stable.    Coronary disease status post PCI Continue home Eliquis , statin, Coreg    Diabetes mellitus type 2  -continue sliding scale    Chronic HFpEF  -Clinically appears to be euvolemic.  Echocardiogram on 01/02/2024 showed preserved EF.   Mixed hyperlipidemia  -Continue Lipitor    Hypothyroidism  -Continue Synthroid    Tobacco abuse -  Tobacco cessation discussed   DVT prophylaxis:Eliquis      Code Status: Full Code Family Communication:   Status is: Inpatient Remains inpatient appropriate because: Discharge him today after his  radiation   PT Follow up Recs: Home Health Pt11/19/2025 0800  Subjective: Feels much better wanting to go  home    Examination:  General exam: Appears calm and comfortable ; cachectic frail Respiratory system: Clear to auscultation. Respiratory effort normal. Cardiovascular system: S1 & S2 heard, RRR. No JVD, murmurs, rubs, gallops or clicks. No pedal edema. Gastrointestinal system: Abdomen is nondistended, soft and nontender. No organomegaly or masses felt. Normal bowel sounds heard. Central nervous system: Alert and oriented. No focal neurological deficits. Extremities: Symmetric 5 x 5 power. Skin: No rashes, lesions or ulcers Psychiatry: Judgement and insight appear normal. Mood & affect appropriate.    Discharge Diagnoses:  Principal Problem:   Lower extremity weakness Active Problems:   COPD (chronic obstructive pulmonary disease) (HCC)   Atrial fibrillation, chronic (HCC)   Chronic kidney disease, stage 3a (HCC)   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)   Persistent atrial fibrillation (HCC)   Pain from bone metastases (HCC)   Metastatic cancer to spine (HCC)   Pressure injury of skin      Discharge Exam: Vitals:   01/19/24 0404 01/19/24 0846  BP: 124/66 131/61  Pulse: 60 74  Resp: 16 16  Temp: 98.3 F (36.8 C) 99.3 F (37.4 C)  SpO2: 100% 100%   Vitals:   01/18/24 1358 01/18/24 1944 01/19/24 0404 01/19/24 0846  BP: (!) 105/55 106/72 124/66 131/61  Pulse: 73 81 60 74  Resp: 18 17 16 16   Temp: 98.1 F (36.7 C) 98.5 F (36.9 C) 98.3 F (36.8 C) 99.3 F (37.4 C)  TempSrc: Oral Oral Oral Oral  SpO2: 100% 100% 100% 100%  Weight:      Height:          Discharge Instructions   Allergies as of 01/19/2024   No Known Allergies      Medication List     PAUSE taking these medications    furosemide  20 MG tablet Wait to take this until your doctor or other care provider tells you to start again. Commonly known as: LASIX  Take 20 mg by mouth daily.   potassium chloride  SA 20 MEQ tablet Wait to take this until your doctor or  other care provider tells you to start again. Commonly known as: KLOR-CON  M Take 20 mEq by mouth daily after lunch.   spironolactone  50 MG tablet Wait to take this until your doctor or other care provider tells you to start again. Commonly known as: ALDACTONE  Take 50 mg by mouth daily.       STOP taking these medications    fentaNYL  25 MCG/HR Commonly known as: DURAGESIC    traMADol  50 MG tablet Commonly known as: ULTRAM        TAKE these medications    acetaminophen  500 MG tablet Commonly known as: TYLENOL  Take 2 tablets (1,000 mg total) by mouth 3 (three) times daily.   albuterol  108 (90 Base) MCG/ACT inhaler Commonly known as: VENTOLIN  HFA Inhale 2 puffs into the lungs every 6 (six) hours as needed for wheezing or shortness of breath.   apixaban  5 MG Tabs tablet Commonly known as: Eliquis  Take 1 tablet (5 mg total) by mouth 2 (two) times daily. Hold Eliquis  on 01/05/2024 for bronchoscopy   atorvastatin  80 MG tablet Commonly known as: LIPITOR  Take 80 mg by mouth daily.   carvedilol  3.125 MG tablet Commonly known as: COREG  Take 1 tablet (3.125 mg total) by mouth daily with breakfast.   [employer] Gentle Laxative 5  MG EC tablet Generic drug: bisacodyl  TAKE 1 TABLET BY MOUTH DAILY AS NEEDED FOR MODERATE CONSTIPATION. TAKE TWO DAYS BEFORE PROCEDURE What changed: See the new instructions.   dexamethasone  4 MG tablet Commonly known as: DECADRON  Take 1 tablet (4 mg total) by mouth every 8 (eight) hours for 2 days, THEN 1 tablet (4 mg total) 2 (two) times daily for 2 days, THEN 1 tablet (4 mg total) 2 (two) times daily for 7 days. Start taking on: January 19, 2024   feeding supplement Liqd Take 237 mLs by mouth 3 (three) times daily between meals.   ferrous sulfate  325 (65 FE) MG tablet Commonly known as: FerrouSul Take 1 tablet (325 mg total) by mouth daily with breakfast.   levothyroxine  50 MCG tablet Commonly known as: SYNTHROID  Take 50 mcg by mouth daily before  breakfast.   losartan  25 MG tablet Commonly known as: COZAAR  Take 25 mg by mouth daily.   multivitamin with minerals Tabs tablet Take 1 tablet by mouth daily.   nitroGLYCERIN  0.4 MG SL tablet Commonly known as: NITROSTAT  Place 1 tablet (0.4 mg total) under the tongue every 5 (five) minutes as needed for chest pain. Patient must keep upcoming appointment for further refills   omeprazole  20 MG capsule Commonly known as: PRILOSEC Take 20 mg by mouth daily after lunch.   oxyCODONE  5 MG immediate release tablet Commonly known as: Oxy IR/ROXICODONE  Take 1-2 tablets (5-10 mg total) by mouth every 4 (four) hours as needed for moderate pain (pain score 4-6), severe pain (pain score 7-10) or breakthrough pain.   polyethylene glycol powder 17 GM/SCOOP powder Commonly known as: MiraLax  Take 17 g by mouth daily as needed for moderate constipation or mild constipation. Dissolve 1 capful (17g) in 4-8 ounces of liquid and take by mouth daily.   senna-docusate 8.6-50 MG tablet Commonly known as: Senokot-S Take 1 tablet by mouth at bedtime.        Contact information for follow-up providers     Marvine Rush, MD Follow up in 1 week(s).   Specialty: Family Medicine Contact information: [address] [place] [address] [phone]              Contact information for after-discharge care     Home Medical Care     [facility]) .   Service: Home Health Services Contact information: [address] [place]  [address] [phone]                    No Known Allergies  You were cared for by a hospitalist during your hospital stay. If you have any questions about your discharge medications or the care you received while you were in the hospital after you are discharged, you can call the unit and asked to speak with the hospitalist on call if the hospitalist that took care of you is not available. Once you are  discharged, your primary care physician will handle any further medical issues. Please note that no refills for any discharge medications will be authorized once you are discharged, as it is imperative that you return to your primary care physician (or establish a relationship with a primary care physician if you do not have one) for your aftercare needs so that they can reassess your need for medications and monitor your lab values.  You were cared for by a hospitalist during your hospital stay. If you have any questions about your discharge  medications or the care you received while you were in the hospital after you are discharged, you can call the unit and asked to speak with the hospitalist on call if the hospitalist that took care of you is not available. Once you are discharged, your primary care physician will handle any further medical issues. Please note that NO REFILLS for any discharge medications will be authorized once you are discharged, as it is imperative that you return to your primary care physician (or establish a relationship with a primary care physician if you do not have one) for your aftercare needs so that they can reassess your need for medications and monitor your lab values.  Please request your Prim.MD to go over all Hospital Tests and Procedure/Radiological results at the follow up, please get all Hospital records sent to your Prim MD by signing hospital release before you go home.  Get CBC, CMP, 2 view Chest X ray checked  by Primary MD during your next visit or SNF MD in 5-7 days ( we routinely change or add medications that can affect your baseline labs and fluid status, therefore we recommend that you get the mentioned basic workup next visit with your PCP, your PCP may decide not to get them or add new tests based on their clinical decision)  On your next visit with your primary care physician please Get Medicines reviewed and adjusted.  If you experience worsening of  your admission symptoms, develop shortness of breath, life threatening emergency, suicidal or homicidal thoughts you must seek medical attention immediately by calling 911 or calling your MD immediately  if symptoms less severe.  You Must read complete instructions/literature along with all the possible adverse reactions/side effects for all the Medicines you take and that have been prescribed to you. Take any new Medicines after you have completely understood and accpet all the possible adverse reactions/side effects.   Do not drive, operate heavy machinery, perform activities at heights, swimming or participation in water  activities or provide baby sitting services if your were admitted for syncope or siezures until you have seen by Primary MD or a Neurologist and advised to do so again.  Do not drive when taking Pain medications.   Procedures/Studies: IR IMAGING GUIDED PORT INSERTION Result Date: 01/17/2024 CLINICAL DATA:  Metastatic squamous cell cancer of the lung. EXAM: Chest port catheter placement TECHNIQUE: Procedure performed using fluoroscopy and ultrasound CONTRAST:  None RADIOPHARMACEUTICALS:  None FLUOROSCOPY: 1 mGy COMPARISON:  None FINDINGS: The patient was placed in supine position on the IR gantry and the right upper chest and neck were prepped and draped in the usual sterile fashion. The nurse administered intravenous fentanyl  and Versed  under my supervision and the nurse had no other injuries other than monitoring the patient and administering medications. I was present for the entire duration of procedure. 2 mg intravenous Versed  and 100 mcg intravenous fentanyl  for a total sedation time of 60 minutes. Ultrasound guidance was used to investigate the right internal jugular vein which was anechoic and compressible indicating patency. The needle was then advanced from a scan negative through the soft tissue into the right internal jugular vein under ultrasound guidance. A final image  was obtained and stored in the patient's permanent medical record. Access was then exchanged over a guidewire which was advanced under fluoroscopic guidance. The needle was removed and replaced with a micropuncture sheath. Approximately 2 inches below the clavicle the port pocket was created with a subsequent incision. The catheter was then tunneled  from the port pocket to the venotomy site overlying the right internal jugular vein. Access was then exchanged over an 035 guidewire for peel-away sheath which was advanced over the guidewire under fluoroscopic guidance. The catheter was then advanced through the peel-away sheath to the sinoatrial junction. Sheath was removed. The catheter was then cut at the port pocket and connected to chest port. The chest port was tested for function and finally function well. The chest port was then flushed with heparin  and a port pocket was closed with 4-0 suture. Final image was obtained demonstrating satisfactory position of chest port. The final count of all materials was satisfactory. IMPRESSION: 1. Satisfactory placement of right internal jugular vein single-lumen chest port. Catheter tip is at the cavoatrial junction. 2.  Okay to use and power inject chest port. Electronically Signed   By: Cordella Banner   On: 01/17/2024 15:51   MR Lumbar Spine W Wo Contrast Result Date: 01/13/2024 EXAM: MRI LUMBAR SPINE 01/13/2024 06:58:11 PM TECHNIQUE: Multiplanar multisequence MRI of the lumbar spine was performed with and without the administration of intravenous contrast. 6 mL of gadobutrol  (GADAVIST ) 1 MMOL/ML injection was administered. COMPARISON: PET CT 01/09/2024. CT abdomen and pelvis 01/01/2024. CLINICAL HISTORY: Myelopathy, acute, lumbar spine. FINDINGS: BONES AND ALIGNMENT: 5 lumbar type vertebrae. Normal alignment. Multiple T1 hypointense, enhancing lesions consistent with previously shown metastases. Numerous small lesions are scattered throughout the vertebral bodies  and posterior elements of the lumbar spine as well as a larger, 2 cm lesion in the L1 vertebral body. Preserved lumbar vertebral body heights. 4 cm destructive lesion involving the anterior sacrum at S1 on the right with extraosseous tumor potentially affecting the extraforaminal right L5 nerve. Bilateral iliac lesions. Mild multilevel neural foraminal stenosis. SPINAL CORD: The conus medullaris terminates at L1-L2 and is normal in signal. No evidence of epidural tumor in the lumbar spine. SOFT TISSUES: Status post aortobifemoral bypass. DISC LEVELS: Mild diffuse lumbar spondylosis and moderate facet hypertrophy without significant spinal stenosis. Mild multilevel neural foraminal stenosis. IMPRESSION: 1. Known widespread osseous metastases including a destructive 4 cm S1 lesion. 2. No evidence of epidural tumor or spinal stenosis in the lumbar spine. Electronically signed by: Dasie Hamburg MD 01/13/2024 08:00 PM EST RP Workstation: [id number]   MR THORACIC SPINE W WO CONTRAST Result Date: 01/13/2024 EXAM: MRI THORACIC SPINE WITH AND WITHOUT INTRAVENOUS CONTRAST 01/13/2024 06:58:11 PM TECHNIQUE: Multiplanar multisequence MRI of the thoracic spine was performed with and without the administration of intravenous contrast. COMPARISON: PET CT 01/09/2024. CTA chest and CT abdomen and pelvis 01/01/2024. CLINICAL HISTORY: Mid-back pain, neuro deficit; known T11 mass. FINDINGS: BONES AND ALIGNMENT: Normal alignment. Multiple T1 hypointense, enhancing lesions consistent with previously shown metastases, including involvement of the T6 and T9 to T11 vertebral bodies as well as destructive lesions of the posterior right 3rd and 8th ribs. The T9 lesion extends into the right pedicle, and the T11 lesion involves the primarily left sided posterior elements. Pathologic compression fractures with 10% vertebral body height loss at T9 and 45% central vertebral body height loss at T11. Right sided ventral and lateral epidural tumor  at T9 results in mild spinal stenosis and encroaches upon the medial aspect of the right T9-T10 neural foramen. There is a larger amount of ventral and lateral epidural tumor bilaterally at T11 which extends superiorly to T10 and results in moderate spinal stenosis without cord compression. Tumor also encroaches upon the left sided neural foramina at T11-T12 greater than T10-T11. SPINAL CORD: Normal spinal cord  signal. SOFT TISSUES: Known right upper lobe lung mass. DEGENERATIVE CHANGES: Mild thoracic spondylosis without significant degenerative spinal stenosis. LUNGS: IMPRESSION: 1. Known widespread osseous metastases. 2. Pathologic compression fractures with 10% height loss at T9 and 45% height loss at T11. 3. Epidural tumor at T11 resulting in moderate spinal stenosis. 4. Smaller volume epidural tumor at T9 resulting in mild spinal stenosis. Electronically signed by: Dasie Hamburg MD 01/13/2024 07:50 PM EST RP Workstation: [id number]   DG Chest 1 View Result Date: 01/13/2024 CLINICAL DATA:  Weakness, immobility, recent falls, pain to toes with possible infection. EXAM: DG CHEST 1V COMPARISON:  January 01, 2024 FINDINGS: There is stable dual lead AICD positioning. The heart size and mediastinal contours are within normal limits. Mild right upper lobe scarring and/or atelectasis is seen. Adjacent ill-defined right upper lobe opacities are noted which correspond to right upper lobe suprahilar and pleural based mass is seen on recent chest CT (January 01, 2024). No pleural effusion or pneumothorax is identified. Cortical destruction of the anterior aspect of the second right rib is seen. IMPRESSION: 1. Mild right upper lobe scarring and/or atelectasis. 2. Right upper lobe suprahilar and pleural based mass lesions, as described above. 3. Cortical destruction of the anterior aspect of the second right rib. Electronically Signed   By: Suzen Dials M.D.   On: 01/13/2024 16:22   NM PET Image Initial (PI) Skull  Base To Thigh Result Date: 01/12/2024 CLINICAL DATA:  Initial treatment strategy for right upper lobe lung mass with evidence of metastatic disease prior CT. EXAM: NUCLEAR MEDICINE PET SKULL BASE TO THIGH TECHNIQUE: 7.32 mCi F-18 FDG was injected intravenously. Full-ring PET imaging was performed from the skull base to thigh after the radiotracer. CT data was obtained and used for attenuation correction and anatomic localization. Fasting blood glucose: 107 mg/dl COMPARISON:  CT of the chest, abdomen and pelvis 12/22/2023 and 01/01/2024 FINDINGS: Mediastinal blood pool activity: SUV max 1.7 NECK: No hypermetabolic cervical lymph nodes are identified. No suspicious activity identified within the pharyngeal mucosal space. Incidental CT findings: Bilateral carotid atherosclerosis. CHEST: The dominant right perihilar upper lobe mass is hypermetabolic with an SUV max 6.6. This mass measures 3.5 x 2.9 cm on image 25/205. No hypermetabolic pulmonary activity or suspicious nodularity in the left lung. There are hypermetabolic mediastinal lymph nodes, including a subcarinal node measuring 1.9 cm short axis (SUV max 5.5). Incidental CT findings: Left subclavian pacemaker leads extend into the right atrium and right ventricle. Atherosclerosis of the aorta, great vessels and coronary arteries. Mild centrilobular emphysema present ABDOMEN/PELVIS: There is no hypermetabolic activity within the liver, adrenal glands, spleen or pancreas. There is no hypermetabolic nodal activity in the abdomen or pelvis. Previously demonstrated peritoneal nodularity anteriorly in the left upper quadrant is hypermetabolic. Largest component measures 2.2 x 1.4 cm on image 81/2 and has an SUV max of 6.2. No significant ascites. Incidental CT findings: Diffuse gaseous distension of the bowel again noted without evidence of obstruction or perforation. Aortic and branch vessel atherosclerosis post aortobifemoral bypass. Moderate prostatomegaly without  focal hypermetabolic activity. SKELETON: As seen on prior imaging, there is widespread osseous metastatic disease with associated hypermetabolic activity. Representative lesions include a lytic lesion with a soft tissue component in the anterior aspect of the right 2nd rib, measuring 4.0 x 2.2 cm image 44/202 (SUV max 6.2), a lesion involving the posterior aspect the right 3rd rib, measuring 4.2 cm on image 36/202 (SUV max 8.0), and a large expansile destructive mass involving the left  ischium, measuring 6.7 x 5.5 cm on image 144/202 (SUV max 8.4). There are multiple other lesions, including metastases within the T9 and T11 vertebral bodies. The T11 lesion is associated with anterior epidural tumor which may place the patient at risk for cord compression. Appearance is similar to previous CT. Incidental CT findings: none IMPRESSION: 1. The dominant right upper lobe lung mass is hypermetabolic, consistent with primary bronchogenic carcinoma. 2. Hypermetabolic mediastinal adenopathy consistent with metastatic disease. 3. Widespread osseous metastatic disease with multiple lesions as described. The T11 lesion is associated with anterior epidural tumor which may place the patient at risk for cord compression. 4. Hypermetabolic peritoneal nodularity in the left upper quadrant consistent with metastatic disease. 5. Aortic Atherosclerosis (ICD10-I70.0) and Emphysema (ICD10-J43.9). Electronically Signed   By: Elsie Perone M.D.   On: 01/12/2024 10:46   ECHOCARDIOGRAM COMPLETE Result Date: 01/02/2024    ECHOCARDIOGRAM REPORT   Patient Name:   HRIDAAN BOUSE Date of Exam: 01/02/2024 Medical Rec #:  [id number]       Height:       68.0 in Accession #:    [id number]      Weight:       146.0 lb Date of Birth:  06-Jul-1945      BSA:          1.788 m Patient Age:    78 years        BP:           114/67 mmHg Patient Gender: M               HR:           95 bpm. Exam Location:  Inpatient Procedure: 2D Echo, Cardiac Doppler and  Color Doppler (Both Spectral and Color            Flow Doppler were utilized during procedure). Indications:    Chest Pain R07.9  History:        Patient has prior history of Echocardiogram examinations, most                 recent 08/30/2019. CHF, CAD, Pacemaker, COPD, Arrythmias:PVC and                 NSVT; Risk Factors:Hyperlipidemia, Diabetes and Hypertension.  Sonographer:    BERNARDA ROCKS Referring Phys: [id number] SARA-MAIZ A THOMAS IMPRESSIONS  1. Left ventricular ejection fraction, by estimation, is 60 to 65%. The left ventricle has normal function. Left ventricular endocardial border not optimally defined to evaluate regional wall motion. Left ventricular diastolic parameters are indeterminate.  2. Right ventricular systolic function is normal. The right ventricular size is normal.  3. The mitral valve is abnormal. Mild mitral valve regurgitation.  4. The aortic valve was not well visualized. Aortic valve regurgitation is not visualized.  5. The inferior vena cava is dilated in size with <50% respiratory variability, suggesting right atrial pressure of 15 mmHg. Conclusion(s)/Recommendation(s): Technically very limited study due to poor sound wave transmission. Many structures not seen well. FINDINGS  Left Ventricle: Left ventricular ejection fraction, by estimation, is 60 to 65%. The left ventricle has normal function. Left ventricular endocardial border not optimally defined to evaluate regional wall motion. The left ventricular internal cavity size was normal in size. Suboptimal image quality limits for assessment of left ventricular hypertrophy. Left ventricular diastolic parameters are indeterminate. Right Ventricle: The right ventricular size is normal. No increase in right ventricular wall thickness. Right ventricular systolic function is normal. Left Atrium: Left atrial  size was normal in size. Right Atrium: Right atrial size was normal in size. Pericardium: There is no evidence of pericardial effusion.  Mitral Valve: The mitral valve is abnormal. There is mild thickening of the mitral valve leaflet(s). Mild mitral valve regurgitation. MV peak gradient, 2.9 mmHg. The mean mitral valve gradient is 1.0 mmHg. Tricuspid Valve: The tricuspid valve is not well visualized. Tricuspid valve regurgitation is not demonstrated. Aortic Valve: The aortic valve was not well visualized. Aortic valve regurgitation is not visualized. Aortic valve mean gradient measures 1.0 mmHg. Aortic valve peak gradient measures 1.6 mmHg. Aortic valve area, by VTI measures 4.13 cm. Pulmonic Valve: The pulmonic valve was not well visualized. Pulmonic valve regurgitation is not visualized. Aorta: The aortic root is normal in size and structure. Venous: The inferior vena cava is dilated in size with less than 50% respiratory variability, suggesting right atrial pressure of 15 mmHg. IAS/Shunts: The interatrial septum was not well visualized. Additional Comments: A device lead is visualized.  LEFT VENTRICLE PLAX 2D LVIDd:         4.80 cm      Diastology LVIDs:         3.20 cm      LV e' medial:    5.98 cm/s LV PW:         0.90 cm      LV E/e' medial:  15.6 LV IVS:        1.00 cm      LV e' lateral:   13.20 cm/s LVOT diam:     2.10 cm      LV E/e' lateral: 7.1 LV SV:         40 LV SV Index:   22 LVOT Area:     3.46 cm  LV Volumes (MOD) LV vol d, MOD A2C: 99.7 ml LV vol d, MOD A4C: 159.0 ml LV vol s, MOD A2C: 37.6 ml LV vol s, MOD A4C: 54.4 ml LV SV MOD A2C:     62.1 ml LV SV MOD A4C:     159.0 ml LV SV MOD BP:      80.6 ml RIGHT VENTRICLE             IVC RV Basal diam:  3.50 cm     IVC diam: 2.10 cm RV S prime:     11.00 cm/s TAPSE (M-mode): 2.5 cm LEFT ATRIUM             Index        RIGHT ATRIUM           Index LA diam:        3.70 cm 2.07 cm/m   RA Area:     15.10 cm LA Vol (A2C):   26.9 ml 15.05 ml/m  RA Volume:   38.70 ml  21.65 ml/m LA Vol (A4C):   40.8 ml 22.82 ml/m LA Biplane Vol: 36.2 ml 20.25 ml/m  AORTIC VALVE                     PULMONIC VALVE AV Area (Vmax):    3.55 cm     PV Vmax:       0.68 m/s AV Area (Vmean):   3.30 cm     PV Peak grad:  1.8 mmHg AV Area (VTI):     4.13 cm AV Vmax:           64.10 cm/s AV Vmean:          41.400  cm/s AV VTI:            0.097 m AV Peak Grad:      1.6 mmHg AV Mean Grad:      1.0 mmHg LVOT Vmax:         65.70 cm/s LVOT Vmean:        39.500 cm/s LVOT VTI:          0.116 m LVOT/AV VTI ratio: 1.19  AORTA Ao Root diam: 3.40 cm Ao Asc diam:  3.50 cm MITRAL VALVE MV Area (PHT): 4.71 cm    SHUNTS MV Area VTI:   2.59 cm    Systemic VTI:  0.12 m MV Peak grad:  2.9 mmHg    Systemic Diam: 2.10 cm MV Mean grad:  1.0 mmHg MV Vmax:       0.84 m/s MV Vmean:      43.0 cm/s MV Decel Time: 161 msec MV E velocity: 93.30 cm/s MV A velocity: 46.70 cm/s MV E/A ratio:  2.00 Toribio Fuel MD Electronically signed by Toribio Fuel MD Signature Date/Time: 01/02/2024/11:52:55 PM    Final    CT ABDOMEN PELVIS W CONTRAST Result Date: 01/01/2024 EXAM: CT ABDOMEN AND PELVIS WITH CONTRAST 01/01/2024 07:13:00 PM TECHNIQUE: CT of the abdomen and pelvis was performed with the administration of 50 mL of iohexol  (OMNIPAQUE ) 350 MG/ML injection. Multiplanar reformatted images are provided for review. Automated exposure control, iterative reconstruction, and/or weight-based adjustment of the mA/kV was utilized to reduce the radiation dose to as low as reasonably achievable. COMPARISON: 12/22/2023 CLINICAL HISTORY: Bowel obstruction suspected. FINDINGS: LOWER CHEST: See chest CT report today. LIVER: The liver is unremarkable. GALLBLADDER AND BILE DUCTS: Gallbladder is unremarkable. No biliary ductal dilatation. SPLEEN: No acute abnormality. PANCREAS: No acute abnormality. ADRENAL GLANDS: No acute abnormality. KIDNEYS, URETERS AND BLADDER: No stones in the kidneys or ureters. No hydronephrosis. No perinephric or periureteral stranding. Urinary bladder is unremarkable. GI AND BOWEL: Gaseous distention of the bowel involves both large  and small bowel; however, distal small bowel loops are decompressed. While I favor this most likely reflects ileus, it is difficult to completely exclude distal small bowel obstruction. Moderate stool burden throughout the colon. Normal appendix. PERITONEUM AND RETROPERITONEUM: No ascites. No free air. VASCULATURE: Aorta is normal in caliber. Changes of prior aortobifemoral bypass. LYMPH NODES: No lymphadenopathy. REPRODUCTIVE ORGANS: Prostate enlargement. 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 No focal soft tissue abnormality. IMPRESSION: 1. Gaseous distention of bowel involving both large and small bowel with decompressed distal small bowel loops, most likely ileus; distal small bowel obstruction cannot be completely excluded. 2. 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 Electronically signed by: Franky Crease MD 01/01/2024 07:31 PM EST RP Workstation: [id number]   CT Angio Chest PE W and/or Wo Contrast Result Date: 01/01/2024 EXAM: CTA of the Chest without and with contrast for PE 01/01/2024 07:13:00 PM TECHNIQUE: CTA of the chest was performed without and with the administration of 50 mL of iohexol  (OMNIPAQUE ) 350 MG/ML injection. Multiplanar reformatted images are provided for review. MIP images are provided for review. Automated exposure control, iterative reconstruction, and/or weight based adjustment of the mA/kV was utilized to reduce the radiation dose to as low as reasonably achievable. COMPARISON: 12/22/2023 CLINICAL HISTORY: Pulmonary embolism (PE) suspected, high prob. FINDINGS: PULMONARY ARTERIES: Pulmonary arteries are adequately opacified for evaluation. No pulmonary embolism. Main pulmonary artery is normal in caliber. MEDIASTINUM: The heart and  pericardium demonstrate no acute abnormality. There is no acute abnormality of the thoracic aorta. Right hilar and  mediastinal adenopathy again noted, stable. LYMPH NODES: Right hilar and mediastinal adenopathy again noted, stable. No axillary lymphadenopathy. LUNGS AND PLEURA: Right upper lobe mass measures 3 x 3 cm on image 58. No focal consolidation or pulmonary edema. No pleural effusion or pneumothorax. UPPER ABDOMEN: Limited images of the upper abdomen are unremarkable. SOFT TISSUES AND BONES: Numerous lytic bony metastases throughout the ribs with soft tissue masses are unchanged. Stable lytic metastases throughout the thoracic spine. IMPRESSION: 1. No evidence of pulmonary embolism. 2. Stable right upper lobe mass with unchanged right hilar and mediastinal adenopathy and diffuse lytic osseous metastases in the ribs and thoracic spine. Electronically signed by: Franky Crease MD 01/01/2024 07:26 PM EST RP Workstation: [id number]   DG Chest 2 View Result Date: 01/01/2024 EXAM: 2 VIEW(S) XRAY OF THE CHEST 01/01/2024 06:36:16 PM COMPARISON: None available. CLINICAL HISTORY: chest pain FINDINGS: LINES, TUBES AND DEVICES: Left chest pacemaker with leads overlying right atrium and right ventricle. LUNGS AND PLEURA: Right upper lung opacity. Right hilar asymmetric density. Ovoid pleural-based opacity at the right apex corresponding to rib lesion on CT. No pulmonary edema. No pleural effusion. No pneumothorax. HEART AND MEDIASTINUM: No acute abnormality of the cardiac an silhouettes. BONES AND SOFT TISSUES: Known right second and third rib lesions. IMPRESSION: 1. Right upper lung and hilar opacity corresponding to the mass recently demonstrated on CT. 2. Right second and third rib lesions corresponding to metastatic lesions seen on CT Electronically signed by: Luke Bun MD 01/01/2024 06:56 PM EST RP Workstation: [id number]   CUP PACEART INCLINIC DEVICE CHECK Result Date: 01/01/2024 in-clinic _dual__ chamber ICD check. Presenting Rhythm: AF/VS_ . Routine testing was performed. Thresholds, sensing, and impedance demonstrate  stable parameters and no programming changes needed. No treated arrhythmias. Estimated longevity __7.5 years__ . Pt enrolled in remote follow-up. AFib started 11/4, overall burden low, though increased of lat A lead impedance on a gradual downtrend though appears to have leveled off, and remains WNL no VT RU  CT CHEST ABDOMEN PELVIS W CONTRAST Result Date: 12/22/2023 EXAM: CT CHEST, ABDOMEN AND PELVIS WITH CONTRAST 12/22/2023 07:48:41 PM TECHNIQUE: CT of the chest, abdomen and pelvis was performed with the administration of 100 mL of iohexol  (OMNIPAQUE ) 300 MG/ML solution. Multiplanar reformatted images are provided for review. Automated exposure control, iterative reconstruction, and/or weight based adjustment of the mA/kV was utilized to reduce the radiation dose to as low as reasonably achievable. COMPARISON: CT pelvis earlier today CLINICAL HISTORY: Metastatic disease evaluation. Metastatic disease eval, abnormal CT, c/o left hip pain that started 2-3 weeks ago. Pt reports pain radiates into his leg and worse with movement. Pt denies falling or injury. FINDINGS: CHEST: MEDIASTINUM AND LYMPH NODES: Heart and pericardium are unremarkable. Left subclavian icd Moderate coronary atherosclerosis of the lad Thoracic aortic atherosclerosis The central airways are clear. 13 mm short axis right supraclavicular node (image 3). Additional thoracic lymphadenopathy, including a 2.3 cm right hilar node (image 25), and a 1.7 cm short axis subcarinal node (image 29). LUNGS AND PLEURA: 3.0 x 4.0 cm posterior right upper lobe/perihilar mass (image 45) compatible with primary bronchogenic carcinoma. No pleural effusion or pneumothorax. ABDOMEN AND PELVIS: LIVER: The liver is unremarkable. GALLBLADDER AND BILE DUCTS: Gallbladder is unremarkable. No biliary ductal dilatation. SPLEEN: No acute abnormality. PANCREAS: No acute abnormality. ADRENAL GLANDS: No acute abnormality. KIDNEYS, URETERS AND BLADDER: No stones in the kidneys  or ureters. No  hydronephrosis. No perinephric or periureteral stranding. Urinary bladder is unremarkable. GI AND BOWEL: Stomach demonstrates no acute abnormality. There is no bowel obstruction. REPRODUCTIVE ORGANS: Mild prostatomegaly. PERITONEUM AND RETROPERITONEUM: 2.5 cm soft tissue lesion beneath the left upper anterior abdominal wall (image 58) suggesting peritoneal metastasis in this clinical context. No ascites. No free air. VASCULATURE: Atherosclerotic calcification of the abdominal aorta. Aortobifemoral bypass graft, patent. ABDOMINAL AND PELVIS LYMPH NODES: No lymphadenopathy. BONES AND SOFT TISSUES: Lytic calcification metastases involving the anterior 2nd and posterior 3rd ribs (images 11 and 13) measuring up to 4.2 cm. Additional lytic soft tissue metastasis in the right posterior 8th rib. Lytic soft tissue metastasis involving the T9 and T11 vertebral bodies (sagittal image 102) with compression of the spinal canal at T11. Lytic soft tissue metastasis involving the left anterior acetabulum (image 113). Dominant 5.9 x 7.4 cm lytic soft tissue metastasis involving the left hemipelvis, extending from the posterior acetabulum to the inferior pubic ramus (image 123). Additional scattered smaller lytic metastases in the visualized axial and appendicular skeleton. A 2.5 cm soft tissue lesion is noted beneath the left upper anterior abdominal wall (image 58). IMPRESSION: 1. 4.0 cm posterior right upper lobe/perihilar mass, compatible with primary bronchogenic carcinoma. 2. Thoracic nodal metastases, as above. A 13 mm short axis right supraclavicular node would be amenable to percutaneous sampling, as clinically warranted. 3. Suspected isolated peritoneal metastasis in the left upper abdomen. 4. Extensive osseous metastatic disease, as described above, including a T11 lesion narrowing the spinal canal. Consider MR with contrast for further evaluation, as clinically warranted. Electronically signed by: Pinkie Pebbles MD 12/22/2023 08:06 PM EDT RP Workstation: [id number]   CT PELVIS WO CONTRAST Result Date: 12/22/2023 CLINICAL DATA:  Possible lucent lesions on x-ray. Left hip and left thigh pain. No known injury. EXAM: CT PELVIS WITHOUT CONTRAST TECHNIQUE: Multidetector CT imaging of the pelvis was performed following the standard protocol without intravenous contrast. RADIATION DOSE REDUCTION: This exam was performed according to the departmental dose-optimization program which includes automated exposure control, adjustment of the mA and/or kV according to patient size and/or use of iterative reconstruction technique. COMPARISON:  12/22/2023. FINDINGS: Urinary Tract:  No abnormality visualized. Bowel: Unremarkable visualized pelvic bowel loops. Appendix appears normal. Vascular/Lymphatic: Findings suggestive of aortobifemoral bypass grafts. No pelvic lymphadenopathy by size criteria. Reproductive:  The prostate gland is enlarged. Other:  No ascites. Musculoskeletal: No acute fracture or dislocation is seen. Multiple lytic lesions are seen scattered throughout the bones. There are large destructive lesions with soft tissue masses is involving the sacrum on the right, acetabulum on the left, and inferior pubic ramus on the left. Evaluation of the masses is limited due to lack of IV contrast. However the largest soft tissue mass is seen in the region the inferior pubic ramus on the left measuring 5.8 x 5.5 cm. IMPRESSION: Multiple lytic lesions scattered throughout the bones with large destructive lesionsand associated soft tissue masses involving the sacrum on the right, acetabulum on the left, and inferior pubic ramus on the left. No pathologic fracture is seen. Differential diagnosis includes multiple myeloma/plasmacytoma versus metastatic disease. Electronically Signed   By: Leita Birmingham M.D.   On: 12/22/2023 18:31   DG Femur Min 2 Views Left Result Date: 12/22/2023 EXAM: 2 VIEW(S) XRAY OF THE LEFT FEMUR  12/22/2023 05:53:00 PM COMPARISON: None available. CLINICAL HISTORY: left hip/femur pain. Per triage ;  Pt arrives with c/o left hip pain that started 2-3 weeks ago. Pt reports pain radiates into his leg  and worse with movement. Pt denies falling or injury. ; Best lateral obtainable, pt had trouble staying still once in position FINDINGS: BONES AND JOINTS: Left inferior pubic ramus appears fractured and fragmented with some areas of focal lucency. The left femur appears intact. There are mild degenerative changes of the hip and knee. No joint dislocation. SOFT TISSUES: Peripheral vascular calcifications are present. IMPRESSION: 1. Fracture and fragmentation of the left inferior pubic ramus with focal lucency. 2. Mild degenerative changes of the hip and knee. Electronically signed by: Greig Pique MD 12/22/2023 06:10 PM EDT RP Workstation: [id number]   DG Pelvis 1-2 Views Result Date: 12/22/2023 EXAM: 1 or 2 VIEW(S) XRAY OF THE PELVIS 12/22/2023 04:57:18 PM COMPARISON: None available. CLINICAL HISTORY: Left hip pain. FINDINGS: BONES AND JOINTS: Questionable erosive changes and lucent lesions in the left inferior pubic ramus. No joint dislocation. SOFT TISSUES: Surgical clips overlie the bilateral inguinal regions. Peripheral vascular calcifications are present. STOMACH AND BOWEL: Diffuse gaseous distention of visualized colon. IMPRESSION: 1. Questionable erosive changes and lucent lesions in the left inferior pubic ramus. Recommend further evaluation with CT. 2. Diffuse gaseous distention of visualized colon. Electronically signed by: Greig Pique MD 12/22/2023 05:19 PM EDT RP Workstation: [id number]     The results of significant diagnostics from this hospitalization (including imaging, microbiology, ancillary and laboratory) are listed below for reference.     Microbiology: No results found for this or any previous visit (from the past 240 hours).   Labs: BNP (last 3 results) Recent Labs     01/01/24 1705  BNP 224.1*   Basic Metabolic Panel: Recent Labs  Lab 01/14/24 0232 01/15/24 0405 01/16/24 0854 01/17/24 0029 01/17/24 0032 01/18/24 0434 01/19/24 0553  NA 138 135 133*  --  133* 134* 135  K 4.2 4.6 4.7  --  4.9 4.7 4.6  CL 98 100 98  --  98 100 101  CO2 30 25 27   --  25 27 27   GLUCOSE 119* 134* 166*  --  205* 180* 144*  BUN 21 29* 32*  --  36* 40* 38*  CREATININE 1.17 1.22 1.24  --  1.25* 1.24 1.12  CALCIUM  11.6* 10.5* 10.8*  --  10.0 9.6 9.2  MG 1.6* 1.7  --  1.8  --   --   --   PHOS 2.7  --   --   --  2.6  --   --    Liver Function Tests: Recent Labs  Lab 01/15/24 0405 01/16/24 0854 01/17/24 0032 01/18/24 0434 01/19/24 0553  AST 22 27 32 28 29  ALT 24 29 36 40 48*  ALKPHOS 72 89 100 90 85  BILITOT 0.6 0.3 0.3 <0.2 0.3  PROT 6.0* 6.5 6.1* 6.2* 6.1*  ALBUMIN 2.3* 3.3* 3.1* 3.3* 3.1*   No results for input(s): LIPASE, AMYLASE in the last 168 hours. No results for input(s): AMMONIA in the last 168 hours. CBC: Recent Labs  Lab 01/13/24 1505 01/14/24 0232 01/15/24 0405 01/17/24 0029  WBC 14.6* 11.5* 15.2* 14.0*  HGB 10.6* 9.9* 9.0* 8.4*  HCT 33.7* 32.3* 28.4* 27.6*  MCV 84.0 85.0 81.8 84.1  PLT 365 281 276 258   Cardiac Enzymes: No results for input(s): CKTOTAL, CKMB, CKMBINDEX, TROPONINI in the last 168 hours. BNP: Invalid input(s): POCBNP CBG: Recent Labs  Lab 01/18/24 0717 01/18/24 1219 01/18/24 1640 01/18/24 2056 01/19/24 0719  GLUCAP 154* 167* 156* 197* 144*   D-Dimer No results for input(s): DDIMER in the last 72 hours.  Hgb A1c No results for input(s): HGBA1C in the last 72 hours. Lipid Profile No results for input(s): CHOL, HDL, LDLCALC, TRIG, CHOLHDL, LDLDIRECT in the last 72 hours. Thyroid  function studies No results for input(s): TSH, T4TOTAL, T3FREE, THYROIDAB in the last 72 hours.  Invalid input(s): FREET3 Anemia work up No results for input(s): VITAMINB12, FOLATE,  FERRITIN, TIBC, IRON, RETICCTPCT in the last 72 hours. Urinalysis    Component Value Date/Time   COLORURINE YELLOW 01/14/2024 0620   APPEARANCEUR CLEAR 01/14/2024 0620   LABSPEC 1.021 01/14/2024 0620   PHURINE 5.0 01/14/2024 0620   GLUCOSEU NEGATIVE 01/14/2024 0620   HGBUR NEGATIVE 01/14/2024 0620   BILIRUBINUR NEGATIVE 01/14/2024 0620   KETONESUR NEGATIVE 01/14/2024 0620   PROTEINUR NEGATIVE 01/14/2024 0620   NITRITE NEGATIVE 01/14/2024 0620   LEUKOCYTESUR NEGATIVE 01/14/2024 0620   Sepsis Labs Recent Labs  Lab 01/13/24 1505 01/14/24 0232 01/15/24 0405 01/17/24 0029  WBC 14.6* 11.5* 15.2* 14.0*   Microbiology No results found for this or any previous visit (from the past 240 hours).   Time coordinating discharge:  I have spent 35 minutes face to face with the patient and on the ward discussing the patients care, assessment, plan and disposition with other care givers. >50% of the time was devoted counseling the patient about the risks and benefits of treatment/Discharge disposition and coordinating care.   SIGNED:   Burgess JAYSON Dare, MD  [facility] 01/19/2024, 11:26 AM   If 7PM-7AM, please contact night-coverage

## 2024-01-19 NOTE — Progress Notes (Signed)
 Per conversation with RN and MD, patient getting ready to discharge. Plan is to consult SLP services as an OP. No acute needs at this time per MD.   Rea Pass MA, CCC-SLP

## 2024-01-19 NOTE — Progress Notes (Signed)
 Not able to document on patients port a cath in flowsheets  Accessed Flushed; Saline Locked w/ blood return Connections checked and tightened  Dressing: Assessed, clean dry and intact

## 2024-01-19 NOTE — Plan of Care (Signed)
  Problem: Fluid Volume: Goal: Ability to maintain a balanced intake and output will improve Outcome: Progressing   Problem: Education: Goal: Individualized Educational Video(s) Outcome: Not Progressing   Problem: Coping: Goal: Ability to adjust to condition or change in health will improve Outcome: Not Progressing   Problem: Health Behavior/Discharge Planning: Goal: Ability to identify and utilize available resources and services will improve Outcome: Not Progressing Goal: Ability to manage health-related needs will improve Outcome: Not Progressing   Problem: Metabolic: Goal: Ability to maintain appropriate glucose levels will improve Outcome: Not Progressing

## 2024-01-19 NOTE — Plan of Care (Signed)

## 2024-01-20 ENCOUNTER — Ambulatory Visit
Admission: RE | Admit: 2024-01-20 | Discharge: 2024-01-20 | Disposition: A | Discharge status: Home / Self Care | Source: Ambulatory Visit | Attending: Radiation Oncology | Admitting: Radiation Oncology

## 2024-01-20 ENCOUNTER — Other Ambulatory Visit: Payer: Self-pay

## 2024-01-20 DIAGNOSIS — C3411 Malignant neoplasm of upper lobe, right bronchus or lung: Secondary | ICD-10-CM | POA: Diagnosis not present

## 2024-01-20 DIAGNOSIS — Z87891 Personal history of nicotine dependence: Secondary | ICD-10-CM | POA: Diagnosis not present

## 2024-01-20 DIAGNOSIS — Z510 Encounter for antineoplastic radiation therapy: Secondary | ICD-10-CM | POA: Diagnosis not present

## 2024-01-20 DIAGNOSIS — C7951 Secondary malignant neoplasm of bone: Secondary | ICD-10-CM | POA: Diagnosis not present

## 2024-01-20 DIAGNOSIS — C3490 Malignant neoplasm of unspecified part of unspecified bronchus or lung: Secondary | ICD-10-CM

## 2024-01-20 LAB — RAD ONC ARIA SESSION SUMMARY
Course Elapsed Days: 3
Plan Fractions Treated to Date: 3
Plan Fractions Treated to Date: 3
Plan Fractions Treated to Date: 3
Plan Prescribed Dose Per Fraction: 4 Gy
Plan Prescribed Dose Per Fraction: 4 Gy
Plan Prescribed Dose Per Fraction: 4 Gy
Plan Total Fractions Prescribed: 5
Plan Total Fractions Prescribed: 5
Plan Total Fractions Prescribed: 5
Plan Total Prescribed Dose: 20 Gy
Plan Total Prescribed Dose: 20 Gy
Plan Total Prescribed Dose: 20 Gy
Reference Point Dosage Given to Date: 12 Gy
Reference Point Dosage Given to Date: 12 Gy
Reference Point Dosage Given to Date: 12 Gy
Reference Point Session Dosage Given: 4 Gy
Reference Point Session Dosage Given: 4 Gy
Reference Point Session Dosage Given: 4 Gy
Session Number: 3

## 2024-01-21 ENCOUNTER — Inpatient Hospital Stay: Attending: Oncology | Admitting: Oncology

## 2024-01-21 ENCOUNTER — Ambulatory Visit
Admission: RE | Admit: 2024-01-21 | Discharge: 2024-01-21 | Disposition: A | Discharge status: Home / Self Care | Source: Ambulatory Visit | Attending: Radiation Oncology | Admitting: Radiation Oncology

## 2024-01-21 ENCOUNTER — Other Ambulatory Visit: Payer: Self-pay

## 2024-01-21 ENCOUNTER — Telehealth: Payer: Self-pay | Admitting: Radiation Oncology

## 2024-01-21 ENCOUNTER — Inpatient Hospital Stay

## 2024-01-21 ENCOUNTER — Ambulatory Visit

## 2024-01-21 VITALS — BP 104/45 | HR 133 | Temp 97.9°F | Resp 19 | Ht 68.0 in | Wt 145.0 lb

## 2024-01-21 DIAGNOSIS — G893 Neoplasm related pain (acute) (chronic): Secondary | ICD-10-CM

## 2024-01-21 DIAGNOSIS — D649 Anemia, unspecified: Secondary | ICD-10-CM

## 2024-01-21 DIAGNOSIS — C3490 Malignant neoplasm of unspecified part of unspecified bronchus or lung: Secondary | ICD-10-CM

## 2024-01-21 DIAGNOSIS — Z7952 Long term (current) use of systemic steroids: Secondary | ICD-10-CM | POA: Diagnosis not present

## 2024-01-21 DIAGNOSIS — Z7189 Other specified counseling: Secondary | ICD-10-CM | POA: Diagnosis not present

## 2024-01-21 DIAGNOSIS — Z79890 Hormone replacement therapy (postmenopausal): Secondary | ICD-10-CM | POA: Diagnosis not present

## 2024-01-21 DIAGNOSIS — C7951 Secondary malignant neoplasm of bone: Secondary | ICD-10-CM | POA: Diagnosis not present

## 2024-01-21 DIAGNOSIS — Z7901 Long term (current) use of anticoagulants: Secondary | ICD-10-CM | POA: Diagnosis not present

## 2024-01-21 DIAGNOSIS — Z87891 Personal history of nicotine dependence: Secondary | ICD-10-CM | POA: Diagnosis not present

## 2024-01-21 DIAGNOSIS — Z79899 Other long term (current) drug therapy: Secondary | ICD-10-CM | POA: Diagnosis not present

## 2024-01-21 DIAGNOSIS — C3411 Malignant neoplasm of upper lobe, right bronchus or lung: Secondary | ICD-10-CM | POA: Diagnosis not present

## 2024-01-21 DIAGNOSIS — C771 Secondary and unspecified malignant neoplasm of intrathoracic lymph nodes: Secondary | ICD-10-CM | POA: Diagnosis not present

## 2024-01-21 DIAGNOSIS — Z510 Encounter for antineoplastic radiation therapy: Secondary | ICD-10-CM | POA: Diagnosis not present

## 2024-01-21 LAB — RAD ONC ARIA SESSION SUMMARY
Course Elapsed Days: 4
Plan Fractions Treated to Date: 4
Plan Fractions Treated to Date: 4
Plan Fractions Treated to Date: 4
Plan Prescribed Dose Per Fraction: 4 Gy
Plan Prescribed Dose Per Fraction: 4 Gy
Plan Prescribed Dose Per Fraction: 4 Gy
Plan Total Fractions Prescribed: 5
Plan Total Fractions Prescribed: 5
Plan Total Fractions Prescribed: 5
Plan Total Prescribed Dose: 20 Gy
Plan Total Prescribed Dose: 20 Gy
Plan Total Prescribed Dose: 20 Gy
Reference Point Dosage Given to Date: 16 Gy
Reference Point Dosage Given to Date: 16 Gy
Reference Point Dosage Given to Date: 16 Gy
Reference Point Session Dosage Given: 4 Gy
Reference Point Session Dosage Given: 4 Gy
Reference Point Session Dosage Given: 4 Gy
Session Number: 4

## 2024-01-21 LAB — CBC WITH DIFFERENTIAL/PLATELET
Abs Immature Granulocytes: 0.14 K/uL — ABNORMAL HIGH (ref 0.00–0.07)
Basophils Absolute: 0 K/uL (ref 0.0–0.1)
Basophils Relative: 0 %
Eosinophils Absolute: 0 K/uL (ref 0.0–0.5)
Eosinophils Relative: 0 %
HCT: 33.1 % — ABNORMAL LOW (ref 39.0–52.0)
Hemoglobin: 10.3 g/dL — ABNORMAL LOW (ref 13.0–17.0)
Immature Granulocytes: 1 %
Lymphocytes Relative: 1 %
Lymphs Abs: 0.1 K/uL — ABNORMAL LOW (ref 0.7–4.0)
MCH: 25.8 pg — ABNORMAL LOW (ref 26.0–34.0)
MCHC: 31.1 g/dL (ref 30.0–36.0)
MCV: 83 fL (ref 80.0–100.0)
Monocytes Absolute: 0.4 K/uL (ref 0.1–1.0)
Monocytes Relative: 3 %
Neutro Abs: 17.1 K/uL — ABNORMAL HIGH (ref 1.7–7.7)
Neutrophils Relative %: 95 %
Platelets: 326 K/uL (ref 150–400)
RBC: 3.99 MIL/uL — ABNORMAL LOW (ref 4.22–5.81)
RDW: 15.8 % — ABNORMAL HIGH (ref 11.5–15.5)
WBC: 17.8 K/uL — ABNORMAL HIGH (ref 4.0–10.5)
nRBC: 0 % (ref 0.0–0.2)

## 2024-01-21 LAB — VITAMIN B12: Vitamin B-12: 1339 pg/mL — ABNORMAL HIGH (ref 180–914)

## 2024-01-21 LAB — FERRITIN: Ferritin: 338 ng/mL — ABNORMAL HIGH (ref 24–336)

## 2024-01-21 LAB — COMPREHENSIVE METABOLIC PANEL WITH GFR
ALT: 42 U/L (ref 0–44)
AST: 33 U/L (ref 15–41)
Albumin: 3.6 g/dL (ref 3.5–5.0)
Alkaline Phosphatase: 94 U/L (ref 38–126)
Anion gap: 11 (ref 5–15)
BUN: 29 mg/dL — ABNORMAL HIGH (ref 8–23)
CO2: 26 mmol/L (ref 22–32)
Calcium: 9 mg/dL (ref 8.9–10.3)
Chloride: 101 mmol/L (ref 98–111)
Creatinine, Ser: 1.17 mg/dL (ref 0.61–1.24)
GFR, Estimated: >60 mL/min (ref >60)
Glucose, Bld: 208 mg/dL — ABNORMAL HIGH (ref 70–99)
Potassium: 4.2 mmol/L (ref 3.5–5.1)
Sodium: 137 mmol/L (ref 135–145)
Total Bilirubin: 0.5 mg/dL (ref 0.0–1.2)
Total Protein: 6.6 g/dL (ref 6.5–8.1)

## 2024-01-21 LAB — FOLATE: Folate: 6.3 ng/mL (ref >5.9)

## 2024-01-21 LAB — MAGNESIUM: Magnesium: 2.3 mg/dL (ref 1.7–2.4)

## 2024-01-21 LAB — IRON AND TIBC
Iron: 19 ug/dL — ABNORMAL LOW (ref 45–182)
Saturation Ratios: 8 % — ABNORMAL LOW (ref 17.9–39.5)
TIBC: 251 ug/dL (ref 250–450)
UIBC: 231 ug/dL

## 2024-01-21 NOTE — Patient Instructions (Addendum)
 [facility]  Discharge Instructions  You were seen and examined today by Dr. Davonna. Dr. Davonna is a medical oncologist, meaning that she specializes in the treatment of cancer diagnoses. Dr. Davonna discussed your past medical history, family history of cancers, and the events that led to you being here today.  You were referred to Dr. Davonna due to a new diagnosis of Stage IV Lung Cancer. Unfortunately this does mean that the cancer cannot be cured, but it can be controlled with treatment.  Dr. Davonna has recommended starting chemotherapy. Dr. Davonna has recommended mutation testing to see if you are also a candidate for immunotherapy.  Follow-up as scheduled.  Thank you for choosing [facility] to provide your oncology and hematology care.   To afford each patient quality time with our provider, please arrive at least 15 minutes before your scheduled appointment time. You may need to reschedule your appointment if you arrive late (10 or more minutes). Arriving late affects you and other patients whose appointments are after yours.  Also, if you miss three or more appointments without notifying the office, you may be dismissed from the clinic at the provider's discretion.    Again, thank you for choosing [facility].  Our hope is that these requests will decrease the amount of time that you wait before being seen by our physicians.   If you have a lab appointment with the Cancer Center - please note that after April 8th, all labs will be drawn in the cancer center.  You do not have to check in or register with the main entrance as you have in the past but will complete your check-in at the cancer center.            _____________________________________________________________  Should you have questions after your visit to [facility], please contact our office at [phone] and follow the prompts.  Our  office hours are 8:00 a.m. to 4:30 p.m. Monday - Thursday and 8:00 a.m. to 2:30 p.m. Friday.  Please note that voicemails left after 4:00 p.m. may not be returned until the following business day.  We are closed weekends and all major holidays.  You do have access to a nurse 24-7, just call the main number to the clinic [phone] and do not press any options, hold on the line and a nurse will answer the phone.    For prescription refill requests, have your pharmacy contact our office and allow 72 hours.    Masks are no longer required in the cancer centers. If you would like for your care team to wear a mask while they are taking care of you, please let them know. You may have one support person who is at least 80 years old accompany you for your appointments.

## 2024-01-21 NOTE — Progress Notes (Signed)
 Hematology-Oncology Clinic Note  Marvine Rush, MD   Reason for Referral: Metastatic squamous cell carcinoma of the RUL of lung to the bones  Oncology History: I have reviewed his chart and materials related to his cancer extensively and collaborated history with the patient. Summary of oncologic history is as follows:  Diagnosis: Metastatic squamous cell carcinoma of the RUL of lung to the bones  -12/22/2023: Pelvic X-ray: Questionable erosive changes and lucent lesions in the left inferior pubic ramus.  -12/22/2023: Left femur X-ray: Fracture and fragmentation of the left inferior pubic ramus with focal lucency. -12/22/2023: CT Pelvis: Multiple lytic lesions scattered throughout the bones with large destructive lesions and associated soft tissue masses involving the sacrum on the right, acetabulum on the left, and inferior pubic ramus on the left. No pathologic fracture is seen.  -12/22/2023: CT CAP: 4.0 cm posterior right upper lobe/perihilar mass, compatible with primary bronchogenic carcinoma. Thoracic nodal metastases, as above. A 13 mm short axis right supraclavicular node would be amenable to percutaneous sampling, as clinically warranted. Suspected isolated peritoneal metastasis in the left upper abdomen. Extensive osseous metastatic disease, as described above, including a T11 lesion narrowing the spinal canal. -01/07/2024: FNA of RUL bronchial lavage, and lymph nodes 11R, 4R, and 7.  Cytology: Non-small cell carcinoma, consistent with squamous cell carcinoma. Tumor cells stain positive for p40 and negative for TTF-1. -01/11/2024: Initial PET: The dominant right upper lobe lung mass is hypermetabolic, consistent with primary bronchogenic carcinoma. Hypermetabolic mediastinal adenopathy consistent with metastatic disease. Widespread osseous metastatic disease with multiple lesions as described. The T11 lesion is associated with anterior epidural tumor which may place the patient at  risk for cord compression. Hypermetabolic peritoneal nodularity in the left upper quadrant consistent with metastatic disease. -01/13/2024: MRI Thoracic spine: Pathologic compression fractures with 10% height loss at T9 and 45% height loss at T11. Epidural tumor at T11 resulting in moderate spinal stenosis. Smaller volume epidural tumor at T9 resulting in mild spinal stenosis (Admitted to the hospital, evaluated bu neurosurgery and recommended conservative management)  -01/13/2024: MRI Lumbar spine: Known widespread osseous metastases including a destructive 4 cm S1 lesion. No evidence of epidural tumor or spinal stenosis in the lumbar spine. -01/17/2024- Current: Palliative radiation to left pelvis, S1 and most worrisome T spine metastasis -01/17/2024: IR guided port placement  History of Presenting Illness: Discussed the use of AI scribe software for clinical note transcription with the patient, who gave verbal consent to proceed.  History of Present Illness Peter Lambert is an 78 year old male with stage IV non small cell lung cancer who presents for follow up after recent hospitalization for compression fracture of the vertebrae  He has stage IV  lung cancer with metastasis and is experiencing a decline in mobility, requiring a cane for walking.   He has a history of smoking, having quit in 1997 or 2000. He is currently undergoing radiation therapy, with the last session scheduled for tomorrow. Radiation has significantly alleviated his back pain. He experiences occasional numbness in his legs and uses oxygen  at home to assist with breathing.  His current medications include oxycodone  and a fentanyl  patch, which he is not using much. He also takes Tylenol  regularly and is about to complete a seven-day course of steroids.  He lives alone but has daily assistance from a caregiver. His son and nephew live nearby, providing additional support.    Medical History: Past Medical History:   Diagnosis Date   Acute upper GI bleed  Anemia    Anticoagulated on apixaban     Cardiac arrest with ventricular fibrillation (HCC) 2021   CHF (congestive heart failure) (HCC)    Chronic a-fib (HCC)    Coronary artery disease    a. s/p NSTEMI in 2010 with DES to proximal LAD with D1 jailed and angioplasty alone to ostium   Dyspnea    GERD (gastroesophageal reflux disease)    Hyperlipidemia    Hypertension    Mediastinal lymphadenopathy    NSTEMI (non-ST elevated myocardial infarction) (HCC) 2010   Peripheral arterial disease    S/P coronary artery stent placement    S/P ICD (internal cardiac defibrillator) procedure    Tobacco abuse    Type 2 diabetes mellitus (HCC)     Surgical history: Past Surgical History:  Procedure Laterality Date   CARDIAC SURGERY     COLONOSCOPY WITH PROPOFOL  N/A 01/08/2020   Procedure: COLONOSCOPY WITH PROPOFOL ;  Surgeon: Cindie Carlin POUR, DO;  Location: [facility];  Service: Endoscopy;  Laterality: N/A;   COLONOSCOPY WITH PROPOFOL  N/A 05/03/2020   Hung:entire examined colon normal   CORONARY STENT INTERVENTION N/A 09/03/2019   Procedure: CORONARY STENT INTERVENTION;  Surgeon: Verlin Lonni BIRCH, MD;  Location: [facility];  Service: Cardiovascular;  Laterality: N/A;   CORONARY STENT PLACEMENT     ENDOBRONCHIAL ULTRASOUND Bilateral 01/07/2024   Procedure: ENDOBRONCHIAL ULTRASOUND (EBUS);  Surgeon: Malka Domino, MD;  Location: [facility];  Service: Pulmonary;  Laterality: Bilateral;   ENTEROSCOPY N/A 05/03/2020   hung:normal esophagus, normal stomach. Three non-bleeding angiodysplastic lesions in the duodenum. Treated with a monopolar probe. A few non-bleeding angiodysplastic lesions in the jejunum. Treated with a monopolar probe. No specimens collected.   ESOPHAGOGASTRODUODENOSCOPY (EGD) WITH PROPOFOL  N/A 01/06/2020   Procedure: ESOPHAGOGASTRODUODENOSCOPY (EGD) WITH PROPOFOL ;  Surgeon: Golda Claudis PENNER, MD;  Location: [facility];  Service: Endoscopy;  Laterality: N/A;   HOT HEMOSTASIS N/A 05/03/2020   Procedure: HOT HEMOSTASIS (ARGON PLASMA COAGULATION/BICAP);  Surgeon: Rollin Dover, MD;  Location: [facility] ENDOSCOPY;  Service: Gastroenterology;  Laterality: N/A;   ICD IMPLANT N/A 09/04/2019   Procedure: ICD IMPLANT;  Surgeon: Waddell Danelle ORN, MD;  Location: [facility] INVASIVE CV LAB;  Service: Cardiovascular;  Laterality: N/A;   IR IMAGING GUIDED PORT INSERTION  01/17/2024   RIGHT/LEFT HEART CATH AND CORONARY ANGIOGRAPHY N/A 09/03/2019   Procedure: RIGHT/LEFT HEART CATH AND CORONARY ANGIOGRAPHY;  Surgeon: Cherrie Toribio SAUNDERS, MD;  Location: [facility];  Service: Cardiovascular;  Laterality: N/A;     Allergies:  has no known allergies.  Medications:  Current Outpatient Medications  Medication Sig Dispense Refill   acetaminophen  (TYLENOL ) 500 MG tablet Take 2 tablets (1,000 mg total) by mouth 3 (three) times daily.     albuterol  (VENTOLIN  HFA) 108 (90 Base) MCG/ACT inhaler Inhale 2 puffs into the lungs every 6 (six) hours as needed for wheezing or shortness of breath. 8 g 2   apixaban  (ELIQUIS ) 5 MG TABS tablet Take 1 tablet (5 mg total) by mouth 2 (two) times daily. Hold Eliquis  on 01/05/2024 for bronchoscopy 60 tablet 5   atorvastatin  (LIPITOR ) 80 MG tablet Take 80 mg by mouth daily.     carvedilol  (COREG ) 3.125 MG tablet Take 1 tablet (3.125 mg total) by mouth daily with breakfast. 30 tablet 0   [employer] GENTLE LAXATIVE 5 MG EC tablet TAKE 1 TABLET BY MOUTH DAILY AS NEEDED FOR MODERATE CONSTIPATION. TAKE TWO DAYS BEFORE PROCEDURE (Patient taking differently: Take 5 mg by mouth daily  as needed for mild constipation or moderate constipation.) 30 tablet 0   dexamethasone  (DECADRON ) 4 MG tablet Take 1 tablet (4 mg total) by mouth every 8 (eight) hours for 2 days, THEN 1 tablet (4 mg total) 2 (two) times daily for 2 days, THEN 1 tablet (4 mg total) 2 (two) times daily for 7 days. 24 tablet 0   feeding supplement (ENSURE PLUS  HIGH PROTEIN) LIQD Take 237 mLs by mouth 3 (three) times daily between meals.     ferrous sulfate  (FERROUSUL) 325 (65 FE) MG tablet Take 1 tablet (325 mg total) by mouth daily with breakfast.  0   [Paused] furosemide  (LASIX ) 20 MG tablet Take 20 mg by mouth daily.     levothyroxine  (SYNTHROID ) 50 MCG tablet Take 50 mcg by mouth daily before breakfast.     losartan  (COZAAR ) 25 MG tablet Take 25 mg by mouth daily.     Multiple Vitamin (MULTIVITAMIN WITH MINERALS) TABS tablet Take 1 tablet by mouth daily.     nitroGLYCERIN  (NITROSTAT ) 0.4 MG SL tablet Place 1 tablet (0.4 mg total) under the tongue every 5 (five) minutes as needed for chest pain. Patient must keep upcoming appointment for further refills 25 tablet 12   omeprazole  (PRILOSEC) 20 MG capsule Take 20 mg by mouth daily after lunch.     oxyCODONE  (OXY IR/ROXICODONE ) 5 MG immediate release tablet Take 1-2 tablets (5-10 mg total) by mouth every 4 (four) hours as needed for moderate pain (pain score 4-6), severe pain (pain score 7-10) or breakthrough pain. 50 tablet 0   polyethylene glycol powder (MIRALAX ) 17 GM/SCOOP powder Take 17 g by mouth daily as needed for moderate constipation or mild constipation. Dissolve 1 capful (17g) in 4-8 ounces of liquid and take by mouth daily. 238 g 0   [Paused] potassium chloride  SA (KLOR-CON ) 20 MEQ tablet Take 20 mEq by mouth daily after lunch.     senna-docusate (SENOKOT-S) 8.6-50 MG tablet Take 1 tablet by mouth at bedtime. 30 tablet 0   [Paused] spironolactone  (ALDACTONE ) 50 MG tablet Take 50 mg by mouth daily.     colchicine  0.6 MG tablet Take 1 tablet (0.6 mg total) by mouth 2 (two) times daily. 14 tablet 0   No current facility-administered medications for this visit.    Physical Examination: ECOG PERFORMANCE STATUS: 2 - Symptomatic, <50% confined to bed  Vitals:   01/21/24 1353  BP: (!) 104/45  Pulse: (!) 133  Resp: 19  Temp: 97.9 F (36.6 C)  SpO2: 100%   Filed Weights   01/21/24 1353   Weight: 145 lb (65.8 kg)    GENERAL: Frail male in no acute distress.  SKIN: skin color, texture, turgor are normal, no rashes or significant lesions NECK: supple, thyroid  normal size, non-tender, without nodularity LYMPH:  no palpable lymphadenopathy in the cervical, axillary or inguinal LUNGS: clear to auscultation and percussion with normal breathing effort HEART: regular rate & rhythm and no murmurs and no lower extremity edema ABDOMEN:abdomen soft, non-tender and normal bowel sounds Musculoskeletal:no cyanosis of digits and no clubbing  PSYCH: alert & oriented x 3 with fluent speech   Laboratory Data: I have reviewed the data as listed Lab Results  Component Value Date   WBC 17.8 (H) 01/21/2024   HGB 10.3 (L) 01/21/2024   HCT 33.1 (L) 01/21/2024   MCV 83.0 01/21/2024   PLT 326 01/21/2024   Recent Labs    01/18/24 0434 01/19/24 0553 01/21/24 1422  NA 134* 135 137  K 4.7 4.6 4.2  CL 100 101 101  CO2 27 27 26   GLUCOSE 180* 144* 208*  BUN 40* 38* 29*  CREATININE 1.24 1.12 1.17  CALCIUM  9.6 9.2 9.0  GFRNONAA 59* >60 >60  PROT 6.2* 6.1* 6.6  ALBUMIN 3.3* 3.1* 3.6  AST 28 29 33  ALT 40 48* 42  ALKPHOS 90 85 94  BILITOT <0.2 0.3 0.5    Radiographic Studies: I have personally reviewed the radiological images as listed and agreed with the findings in the report.  MRI LUMBAR SPINE 01/13/2024 06:58:11 PM   TECHNIQUE: Multiplanar multisequence MRI of the lumbar spine was performed with and without the administration of intravenous contrast. 6 mL of gadobutrol  (GADAVIST ) 1 MMOL/ML injection was administered.   COMPARISON: PET CT 01/09/2024. CT abdomen and pelvis 01/01/2024.   CLINICAL HISTORY: Myelopathy, acute, lumbar spine.   FINDINGS:   BONES AND ALIGNMENT: 5 lumbar type vertebrae. Normal alignment. Multiple T1 hypointense, enhancing lesions consistent with previously shown metastases. Numerous small lesions are scattered throughout the vertebral bodies  and posterior elements of the lumbar spine as well as a larger, 2 cm lesion in the L1 vertebral body. Preserved lumbar vertebral body heights. 4 cm destructive lesion involving the anterior sacrum at S1 on the right with extraosseous tumor potentially affecting the extraforaminal right L5 nerve. Bilateral iliac lesions. Mild multilevel neural foraminal stenosis.   SPINAL CORD: The conus medullaris terminates at L1-L2 and is normal in signal. No evidence of epidural tumor in the lumbar spine.   SOFT TISSUES: Status post aortobifemoral bypass.   DISC LEVELS: Mild diffuse lumbar spondylosis and moderate facet hypertrophy without significant spinal stenosis. Mild multilevel neural foraminal stenosis.   IMPRESSION: 1. Known widespread osseous metastases including a destructive 4 cm S1 lesion. 2. No evidence of epidural tumor or spinal stenosis in the lumbar spine.   Electronically signed by: Dasie Hamburg MD 01/13/2024 08:00 PM EST RP Workstation: [id number]  MRI THORACIC SPINE WITH AND WITHOUT INTRAVENOUS CONTRAST 01/13/2024 06:58:11 PM   TECHNIQUE: Multiplanar multisequence MRI of the thoracic spine was performed with and without the administration of intravenous contrast.   COMPARISON: PET CT 01/09/2024. CTA chest and CT abdomen and pelvis 01/01/2024.   CLINICAL HISTORY: Mid-back pain, neuro deficit; known T11 mass.   FINDINGS:   BONES AND ALIGNMENT: Normal alignment. Multiple T1 hypointense, enhancing lesions consistent with previously shown metastases, including involvement of the T6 and T9 to T11 vertebral bodies as well as destructive lesions of the posterior right 3rd and 8th ribs. The T9 lesion extends into the right pedicle, and the T11 lesion involves the primarily left sided posterior elements. Pathologic compression fractures with 10% vertebral body height loss at T9 and 45% central vertebral body height loss at T11. Right sided ventral and lateral epidural tumor  at T9 results in mild spinal stenosis and encroaches upon the medial aspect of the right T9-T10 neural foramen. There is a larger amount of ventral and lateral epidural tumor bilaterally at T11 which extends superiorly to T10 and results in moderate spinal stenosis without cord compression. Tumor also encroaches upon the left sided neural foramina at T11-T12 greater than T10-T11.   SPINAL CORD: Normal spinal cord signal.   SOFT TISSUES: Known right upper lobe lung mass.   DEGENERATIVE CHANGES: Mild thoracic spondylosis without significant degenerative spinal stenosis.   LUNGS:   IMPRESSION: 1. Known widespread osseous metastases. 2. Pathologic compression fractures with 10% height loss at T9 and 45% height loss at T11. 3.  Epidural tumor at T11 resulting in moderate spinal stenosis. 4. Smaller volume epidural tumor at T9 resulting in mild spinal stenosis.   Electronically signed by: Dasie Hamburg MD 01/13/2024 07:50 PM EST RP Workstation: [id number]  CLINICAL DATA:  Initial treatment strategy for right upper lobe lung mass with evidence of metastatic disease prior CT.   EXAM: NUCLEAR MEDICINE PET SKULL BASE TO THIGH   TECHNIQUE: 7.32 mCi F-18 FDG was injected intravenously. Full-ring PET imaging was performed from the skull base to thigh after the radiotracer. CT data was obtained and used for attenuation correction and anatomic localization.   Fasting blood glucose: 107 mg/dl   COMPARISON:  CT of the chest, abdomen and pelvis 12/22/2023 and 01/01/2024   FINDINGS: Mediastinal blood pool activity: SUV max 1.7   NECK:   No hypermetabolic cervical lymph nodes are identified. No suspicious activity identified within the pharyngeal mucosal space.   Incidental CT findings: Bilateral carotid atherosclerosis.   CHEST:   The dominant right perihilar upper lobe mass is hypermetabolic with an SUV max 6.6. This mass measures 3.5 x 2.9 cm on image 25/205. No hypermetabolic  pulmonary activity or suspicious nodularity in the left lung. There are hypermetabolic mediastinal lymph nodes, including a subcarinal node measuring 1.9 cm short axis (SUV max 5.5).   Incidental CT findings: Left subclavian pacemaker leads extend into the right atrium and right ventricle. Atherosclerosis of the aorta, great vessels and coronary arteries. Mild centrilobular emphysema present   ABDOMEN/PELVIS:   There is no hypermetabolic activity within the liver, adrenal glands, spleen or pancreas. There is no hypermetabolic nodal activity in the abdomen or pelvis. Previously demonstrated peritoneal nodularity anteriorly in the left upper quadrant is hypermetabolic. Largest component measures 2.2 x 1.4 cm on image 81/2 and has an SUV max of 6.2. No significant ascites.   Incidental CT findings: Diffuse gaseous distension of the bowel again noted without evidence of obstruction or perforation. Aortic and branch vessel atherosclerosis post aortobifemoral bypass. Moderate prostatomegaly without focal hypermetabolic activity.   SKELETON:   As seen on prior imaging, there is widespread osseous metastatic disease with associated hypermetabolic activity. Representative lesions include a lytic lesion with a soft tissue component in the anterior aspect of the right 2nd rib, measuring 4.0 x 2.2 cm image 44/202 (SUV max 6.2), a lesion involving the posterior aspect the right 3rd rib, measuring 4.2 cm on image 36/202 (SUV max 8.0), and a large expansile destructive mass involving the left ischium, measuring 6.7 x 5.5 cm on image 144/202 (SUV max 8.4). There are multiple other lesions, including metastases within the T9 and T11 vertebral bodies. The T11 lesion is associated with anterior epidural tumor which may place the patient at risk for cord compression. Appearance is similar to previous CT.   Incidental CT findings: none   IMPRESSION: 1. The dominant right upper lobe lung mass is  hypermetabolic, consistent with primary bronchogenic carcinoma. 2. Hypermetabolic mediastinal adenopathy consistent with metastatic disease. 3. Widespread osseous metastatic disease with multiple lesions as described. The T11 lesion is associated with anterior epidural tumor which may place the patient at risk for cord compression. 4. Hypermetabolic peritoneal nodularity in the left upper quadrant consistent with metastatic disease. 5. Aortic Atherosclerosis (ICD10-I70.0) and Emphysema (ICD10-J43.9).     Electronically Signed   By: Elsie Perone M.D.   On: 01/12/2024 10:46   ASSESSMENT & PLAN:  Patient is a 78 y.o. male presenting for metastatic non small cell lung carcinoma  Assessment and Plan Assessment &  Plan Stage IV non-small cell lung carcinoma-squamous cell carcinoma Biopsy-proven non-small cell lung carcinoma, recently complicated by compression fracture of the vertebrae requiring hospital admission and palliative radiation. Currently continuing palliative radiation is on dexamethasone   -Discussed extensively with the patient the diagnosis and prognosis.  Patient has a stage IV lung cancer with poor prognosis.  Discussed that treatment at this time would be with the palliative intent and not a curative intent.  Patient does understand that he needs lifelong chemotherapy until progression or intolerance - MRI brain delayed due to ICD placement.  Will get this rescheduled. - Will send [employer] for NGS. - Will request for PD-L1 testing on the biopsy specimen. - Will tentatively start chemotherapy with carboplatin and paclitaxel.  Can add immunotherapy once the NGS results are back. -Discussed risk versus benefits of chemotherapy in detail.We discussed some of the risks, benefits, side-effects of carboplatin & Paclitaxel -Some of the short term side-effects included, though not limited to, including weight loss, life threatening infections, risk of allergic reactions, need  for transfusions of blood products, nausea, vomiting, change in bowel habits, loss of hair, admission to hospital for various reasons, and risks of death.  -Long term side-effects are also discussed including permanent damage to nerve function, hearing loss, chronic fatigue, kidney damage with possibility needing hemodialysis, and rare secondary malignancy including bone marrow disorders. -Patient education material was dispensed.Ultimately,the patient agreed to proceed with plan of care  - Will delay port placement at this time.  Will place that if needed in future.  Return to clinic with second cycle of chemotherapy to assess for tolerance.  Chronic pain due to malignancy Chronic pain managed with oxycodone  and radiation therapy. Radiation therapy effective for pain relief.  - Discontinued fentanyl  patch. -Continue oxycodone  as needed. -Complete the course of dexamethasone . - Use Tylenol  as needed.  Normocytic anemia Will obtain nutritional panel today.  Will replace as needed.  Goals of care discussion Discussed in detail that patient has an incurable cancer at this time.  Patient does understand the poor prognosis of the disease.  Patient stated that he likes to be full code at this time.     Orders Placed This Encounter  Procedures   CBC with Differential    Standing Status:   Future    Number of Occurrences:   1    Expected Date:   01/21/2024    Expiration Date:   04/20/2024   Comprehensive metabolic panel    Standing Status:   Future    Number of Occurrences:   1    Expected Date:   01/21/2024    Expiration Date:   04/20/2024   Magnesium     Standing Status:   Future    Number of Occurrences:   1    Expected Date:   01/21/2024    Expiration Date:   04/20/2024   Iron and TIBC ([facility] ONLY)    Standing Status:   Future    Number of Occurrences:   1    Expected Date:   01/21/2024    Expiration Date:   04/20/2024   Ferritin    Standing Status:    Future    Number of Occurrences:   1    Expected Date:   01/21/2024    Expiration Date:   04/20/2024   Vitamin B12    Standing Status:   Future    Number of Occurrences:   1    Expected Date:   01/21/2024    Expiration  Date:   04/20/2024   Folate    Standing Status:   Future    Number of Occurrences:   1    Expected Date:   01/21/2024    Expiration Date:   04/20/2024   Ambulatory referral to Social Work    Referral Priority:   Routine    Referral Type:   Consultation    Referral Reason:   Specialty Services Required    Number of Visits Requested:   1   Ambulatory Referral to [facility]    Referral Priority:   Routine    Referral Type:   Consultation    Referral Reason:   Specialty Services Required    Number of Visits Requested:   1    The total time spent in the appointment was 40 minutes encounter with patients including review of chart and various tests results, discussions about plan of care and coordination of care plan   All questions were answered. The patient knows to call the clinic with any problems, questions or concerns. No barriers to learning was detected.   LILLETTE Verneta SAUNDERS Teague,acting as a neurosurgeon for Mickiel Dry, MD.,have documented all relevant documentation on the behalf of Mickiel Dry, MD,as directed by  Mickiel Dry, MD while in the presence of Mickiel Dry, MD.  I, Mickiel Dry MD, have reviewed the above documentation for accuracy and completeness, and I agree with the above.    Mickiel Dry, MD 11/30/202512:07 AM

## 2024-01-21 NOTE — Telephone Encounter (Signed)
 11/25 am Left voicemail for patient to call our office to be sch for follow up appt - Nurse call.

## 2024-01-22 ENCOUNTER — Ambulatory Visit
Admission: RE | Admit: 2024-01-22 | Discharge: 2024-01-22 | Disposition: A | Discharge status: Home / Self Care | Source: Ambulatory Visit | Attending: Radiation Oncology | Admitting: Radiation Oncology

## 2024-01-22 ENCOUNTER — Inpatient Hospital Stay: Admitting: Licensed Clinical Social Worker

## 2024-01-22 ENCOUNTER — Telehealth: Payer: Self-pay | Admitting: Radiation Oncology

## 2024-01-22 ENCOUNTER — Other Ambulatory Visit: Payer: Self-pay

## 2024-01-22 DIAGNOSIS — I252 Old myocardial infarction: Secondary | ICD-10-CM | POA: Diagnosis not present

## 2024-01-22 DIAGNOSIS — M4804 Spinal stenosis, thoracic region: Secondary | ICD-10-CM | POA: Diagnosis not present

## 2024-01-22 DIAGNOSIS — D72829 Elevated white blood cell count, unspecified: Secondary | ICD-10-CM | POA: Diagnosis not present

## 2024-01-22 DIAGNOSIS — N1831 Chronic kidney disease, stage 3a: Secondary | ICD-10-CM | POA: Diagnosis not present

## 2024-01-22 DIAGNOSIS — F17210 Nicotine dependence, cigarettes, uncomplicated: Secondary | ICD-10-CM | POA: Diagnosis not present

## 2024-01-22 DIAGNOSIS — J439 Emphysema, unspecified: Secondary | ICD-10-CM | POA: Diagnosis not present

## 2024-01-22 DIAGNOSIS — M1612 Unilateral primary osteoarthritis, left hip: Secondary | ICD-10-CM | POA: Diagnosis not present

## 2024-01-22 DIAGNOSIS — M8458XD Pathological fracture in neoplastic disease, other specified site, subsequent encounter for fracture with routine healing: Secondary | ICD-10-CM | POA: Diagnosis not present

## 2024-01-22 DIAGNOSIS — I4819 Other persistent atrial fibrillation: Secondary | ICD-10-CM | POA: Diagnosis not present

## 2024-01-22 DIAGNOSIS — C7951 Secondary malignant neoplasm of bone: Secondary | ICD-10-CM | POA: Diagnosis not present

## 2024-01-22 DIAGNOSIS — D500 Iron deficiency anemia secondary to blood loss (chronic): Secondary | ICD-10-CM | POA: Diagnosis not present

## 2024-01-22 DIAGNOSIS — I130 Hypertensive heart and chronic kidney disease with heart failure and stage 1 through stage 4 chronic kidney disease, or unspecified chronic kidney disease: Secondary | ICD-10-CM | POA: Diagnosis not present

## 2024-01-22 DIAGNOSIS — G893 Neoplasm related pain (acute) (chronic): Secondary | ICD-10-CM | POA: Diagnosis not present

## 2024-01-22 DIAGNOSIS — D631 Anemia in chronic kidney disease: Secondary | ICD-10-CM | POA: Diagnosis not present

## 2024-01-22 DIAGNOSIS — I4510 Unspecified right bundle-branch block: Secondary | ICD-10-CM | POA: Diagnosis not present

## 2024-01-22 DIAGNOSIS — I2510 Atherosclerotic heart disease of native coronary artery without angina pectoris: Secondary | ICD-10-CM | POA: Diagnosis not present

## 2024-01-22 DIAGNOSIS — C3411 Malignant neoplasm of upper lobe, right bronchus or lung: Secondary | ICD-10-CM | POA: Diagnosis not present

## 2024-01-22 DIAGNOSIS — D630 Anemia in neoplastic disease: Secondary | ICD-10-CM | POA: Diagnosis not present

## 2024-01-22 DIAGNOSIS — E1151 Type 2 diabetes mellitus with diabetic peripheral angiopathy without gangrene: Secondary | ICD-10-CM | POA: Diagnosis not present

## 2024-01-22 DIAGNOSIS — I5032 Chronic diastolic (congestive) heart failure: Secondary | ICD-10-CM | POA: Diagnosis not present

## 2024-01-22 DIAGNOSIS — Z87891 Personal history of nicotine dependence: Secondary | ICD-10-CM | POA: Diagnosis not present

## 2024-01-22 DIAGNOSIS — Z510 Encounter for antineoplastic radiation therapy: Secondary | ICD-10-CM | POA: Diagnosis not present

## 2024-01-22 DIAGNOSIS — I700 Atherosclerosis of aorta: Secondary | ICD-10-CM | POA: Diagnosis not present

## 2024-01-22 DIAGNOSIS — I4901 Ventricular fibrillation: Secondary | ICD-10-CM | POA: Diagnosis not present

## 2024-01-22 DIAGNOSIS — C3490 Malignant neoplasm of unspecified part of unspecified bronchus or lung: Secondary | ICD-10-CM

## 2024-01-22 DIAGNOSIS — M1712 Unilateral primary osteoarthritis, left knee: Secondary | ICD-10-CM | POA: Diagnosis not present

## 2024-01-22 DIAGNOSIS — E1122 Type 2 diabetes mellitus with diabetic chronic kidney disease: Secondary | ICD-10-CM | POA: Diagnosis not present

## 2024-01-22 LAB — RAD ONC ARIA SESSION SUMMARY
Course Elapsed Days: 5
Plan Fractions Treated to Date: 5
Plan Fractions Treated to Date: 5
Plan Fractions Treated to Date: 5
Plan Prescribed Dose Per Fraction: 4 Gy
Plan Prescribed Dose Per Fraction: 4 Gy
Plan Prescribed Dose Per Fraction: 4 Gy
Plan Total Fractions Prescribed: 5
Plan Total Fractions Prescribed: 5
Plan Total Fractions Prescribed: 5
Plan Total Prescribed Dose: 20 Gy
Plan Total Prescribed Dose: 20 Gy
Plan Total Prescribed Dose: 20 Gy
Reference Point Dosage Given to Date: 20 Gy
Reference Point Dosage Given to Date: 20 Gy
Reference Point Dosage Given to Date: 20 Gy
Reference Point Session Dosage Given: 4 Gy
Reference Point Session Dosage Given: 4 Gy
Reference Point Session Dosage Given: 4 Gy
Session Number: 5

## 2024-01-22 MED FILL — Albuterol Sulfate Soln Nebu 0.083% (2.5 MG/3ML): RESPIRATORY_TRACT | Qty: 3 | Status: AC

## 2024-01-22 MED FILL — Ipratropium-Albuterol Nebu Soln 0.5-2.5(3) MG/3ML: RESPIRATORY_TRACT | Qty: 3 | Status: AC

## 2024-01-22 NOTE — Telephone Encounter (Addendum)
 11/26 Left voicemail to confirm date/time of follow up on 1/8 for nurse call after final txt.

## 2024-01-22 NOTE — Patient Instructions (Signed)
 [facility] Chemotherapy Teaching  You have been diagnosed with Stage 4 lung cancer by your oncologist. You will receive the treatment Taxol and Carboplatin every three weeks.  The intent of treatment is Palliative.    You will see the doctor regularly throughout treatment.  We will obtain blood work from you prior to every treatment and monitor your results to make sure it is safe to give your treatment. The doctor monitors your response to treatment by the way you are feeling, your blood work, and by obtaining scans periodically.  There will be wait times while you are here for treatment.  It will take about 30 minutes to 1 hour for your lab work to result.  Then there will be wait times while pharmacy mixes your medications.   Medications you will receive in the clinic prior to your chemotherapy medications:   Aloxi:  ALOXI is used in adults to help prevent the nausea and vomiting that happens with certain chemotherapy drugs.  Aloxi is a long acting medication, and will remain in your system for about 2 days.    Dexamethasone :  This is a steroid given prior to chemotherapy to help prevent allergic reactions; it may also help prevent and control nausea and diarrhea.    Pepcid:  This medication is a histamine blocker that helps prevent and allergic reaction to your chemotherapy.    Benadryl:  This is a histamine blocker (different from the Pepcid) that helps prevent allergic/infusion reactions to your chemotherapy. This medication may cause dizziness/drowsiness.   Paclitaxel (Taxol)  About This Drug Paclitaxel is a drug used to treat cancer. It is given in the vein (IV).  This will take 1 hour to infuse.  This first infusion will take longer to infuse because it is increased slowly to monitor for reactions.  The nurse will be in the room with you for the first 15 minutes of the first infusion.  Possible Side Effects   Hair loss. Hair loss is often temporary, although with  certain medicine, hair loss can sometimes be permanent. Hair loss may happen suddenly or gradually. If you lose hair, you may lose it from your head, face, armpits, pubic area, chest, and/or legs. You may also notice your hair getting thin.   Swelling of your legs, ankles and/or feet (edema)   Flushing   Nausea and throwing up (vomiting)   Loose bowel movements (diarrhea)   Bone marrow depression. This is a decrease in the number of white blood cells, red blood cells, and platelets. This may raise your risk of infection, make you tired and weak (fatigue), and raise your risk of bleeding.   Effects on the nerves are called peripheral neuropathy. You may feel numbness, tingling, or pain in your hands and feet. It may be hard for you to button your clothes, open jars, or walk as usual. The effect on the nerves may get worse with more doses of the drug. These effects get better in some people after the drug is stopped but it does not get better in all people.   Changes in your liver function   Bone, joint and muscle pain   Abnormal EKG   Allergic reaction: Allergic reactions, including anaphylaxis are rare but may happen in some patients. Signs of allergic reaction to this drug may be swelling of the face, feeling like your tongue or throat are swelling, trouble breathing, rash, itching, fever, chills, feeling dizzy, and/or feeling that your heart is beating in a fast  or not normal way. If this happens, do not take another dose of this drug. You should get urgent medical treatment.   Infection   Changes in your kidney function.  Note: Each of the side effects above was reported in 20% or greater of patients treated with paclitaxel. Not all possible side effects are included above.  Warnings and Precautions   Severe allergic reactions   Severe bone marrow depression  Treating Side Effects   To help with hair loss, wash with a mild shampoo and avoid washing your hair every day.    Avoid rubbing your scalp, instead, pat your hair or scalp dry   Avoid coloring your hair   Limit your use of hair spray, electric curlers, blow dryers, and curling irons.   If you are interested in getting a wig, talk to your nurse. You can also call the [employer] at 800-ACS-2345 to find out information about the "Look Good, Feel Better" program close to where you live. It is a free program where women getting chemotherapy can learn about wigs, turbans and scarves as well as makeup techniques and skin and nail care.   Ask your doctor or nurse about medicines that are available to help stop or lessen diarrhea and/or nausea.   To help with nausea and vomiting, eat small, frequent meals instead of three large meals a day. Choose foods and drinks that are at room temperature. Ask your nurse or doctor about other helpful tips and medicine that is available to help or stop lessen these symptoms.   If you get diarrhea, eat low-fiber foods that are high in protein and calories and avoid foods that can irritate your digestive tracts or lead to cramping. Ask your nurse or doctor about medicine that can lessen or stop your diarrhea.   Mouth care is very important. Your mouth care should consist of routine, gentle cleaning of your teeth or dentures and rinsing your mouth with a mixture of 1/2 teaspoon of salt in 8 ounces of water  or  teaspoon of baking soda in 8 ounces of water . This should be done at least after each meal and at bedtime.   If you have mouth sores, avoid mouthwash that has alcohol. Also avoid alcohol and smoking because they can bother your mouth and throat.   Drink plenty of fluids (a minimum of eight glasses per day is recommended).   Take your temperature as your doctor or nurse tells you, and whenever you feel like you may have a fever.   Talk to your doctor or nurse about precautions you can take to avoid infections and bleeding.   Be careful when cooking, walking,  and handling sharp objects and hot liquids.  Food and Drug Interactions   There are no known interactions of paclitaxel with food.   This drug may interact with other medicines. Tell your doctor and pharmacist about all the medicines and dietary supplements (vitamins, minerals, herbs and others) that you are taking at this time.   The safety and use of dietary supplements and alternative diets are often not known. Using these might affect your cancer or interfere with your treatment. Until more is known, you should not use dietary supplements or alternative diets without your cancer doctor's help.  When to Call the Doctor  Call your doctor or nurse if you have any of the following symptoms and/or any new or unusual symptoms:   Fever of 100.4 F (38 C) or above   Chills  Redness, pain, warmth, or swelling at the IV site during the infusion   Signs of allergic reaction: swelling of the face, feeling like your tongue or throat are swelling, trouble breathing, rash, itching, fever, chills, feeling dizzy, and/or feeling that your heart is beating in a fast or not normal way   Feeling that your heart is beating in a fast or not normal way (palpitations)   Weight gain of 5 pounds in one week (fluid retention)   Decreased urine or very dark urine   Signs of liver problems: dark urine, pale bowel movements, bad stomach pain, feeling very tired and weak, unusual  itching, or yellowing of the eyes or skin   Heavy menstrual period that lasts longer than normal   Easy bruising or bleeding   Nausea that stops you from eating or drinking, and/or that is not relieved by prescribed medicines.   Loose bowel movements (diarrhea) more than 4 times a day or diarrhea with weakness or lightheadedness   Pain in your mouth or throat that makes it hard to eat or drink   Lasting loss of appetite or rapid weight loss of five pounds in a week   Signs of peripheral neuropathy: numbness, tingling, or  decreased feeling in fingers or toes; trouble walking or changes in the way you walk; or feeling clumsy when buttoning clothes, opening jars, or other routine activities   Joint and muscle pain that is not relieved by prescribed medicines   Extreme fatigue that interferes with normal activities   While you are getting this drug, please tell your nurse right away if you have any pain, redness, or swelling at the site of the IV infusion.   If you think you are pregnant.  Reproduction Warnings   Pregnancy warning: This drug may have harmful effects on the unborn child, it is recommended that effective methods of birth control should be used during your cancer treatment. Let your doctor know right away if you think you may be pregnant.   Breast feeding warning: Women should not breast feed during treatment because this drug could enter the breastmilk and cause harm to a breast feeding baby.   Carboplatin (Paraplatin, CBDCA)  About This Drug  Carboplatin is used to treat cancer. It is given in the vein (IV).  It will take 30 minutes to infuse.   Possible Side Effects   Bone marrow suppression. This is a decrease in the number of white blood cells, red blood cells, and platelets. This may raise your risk of infection, make you tired and weak (fatigue), and raise your risk of bleeding.   Nausea and vomiting (throwing up)   Weakness   Changes in your liver function   Changes in your kidney function   Electrolyte changes   Pain  Note: Each of the side effects above was reported in 20% or greater of patients treated with carboplatin. Not all possible side effects are included above.   Warnings and Precautions   Severe bone marrow suppression   Allergic reactions, including anaphylaxis are rare but may happen in some patients. Signs of allergic reaction to this drug may be swelling of the face, feeling like your tongue or throat are swelling, trouble breathing, rash, itching,  fever, chills, feeling dizzy, and/or feeling that your heart is beating in a fast or not normal way. If this happens, do not take another dose of this drug. You should get urgent medical treatment.   Severe nausea and vomiting   Effects on  the nerves are called peripheral neuropathy. This risk is increased if you are over the age of 36 or if you have received other medicine with risk of peripheral neuropathy. You may feel numbness, tingling, or pain in your hands and feet. It may be hard for you to button your clothes, open jars, or walk as usual. The effect on the nerves may get worse with more doses of the drug. These effects get better in some people after the drug is stopped but it does not get better in all people.   Blurred vision, loss of vision or other changes in eyesight   Decreased hearing   - Skin and tissue irritation including redness, pain, warmth, or swelling at the IV site if the drug leaks out of the vein and into nearby tissue.   Severe changes in your kidney function, which can cause kidney failure   Severe changes in your liver function, which can cause liver failure  Note: Some of the side effects above are very rare. If you have concerns and/or questions, please discuss them with your medical team.   Important Information   This drug may be present in the saliva, tears, sweat, urine, stool, vomit, semen, and vaginal secretions. Talk to your doctor and/or your nurse about the necessary precautions to take during this time.   Treating Side Effects   Manage tiredness by pacing your activities for the day.   Be sure to include periods of rest between energy-draining activities.   To decrease the risk of infection, wash your hands regularly.   Avoid close contact with people who have a cold, the flu, or other infections.   Take your temperature as your doctor or nurse tells you, and whenever you feel like you may have a fever.   To help decrease the risk of  bleeding, use a soft toothbrush. Check with your nurse before using dental floss.   Be very careful when using knives or tools.   Use an electric shaver instead of a razor.   Drink plenty of fluids (a minimum of eight glasses per day is recommended).   If you throw up or have loose bowel movements, you should drink more fluids so that you do not become dehydrated (lack of water  in the body from losing too much fluid).   To help with nausea and vomiting, eat small, frequent meals instead of three large meals a day. Choose foods and drinks that are at room temperature. Ask your nurse or doctor about other helpful tips and medicine that is available to help stop or lessen these symptoms.   If you have numbness and tingling in your hands and feet, be careful when cooking, walking, and handling sharp objects and hot liquids.   Keeping your pain under control is important to your well-being. Please tell your doctor or nurse if you are experiencing pain.   Food and Drug Interactions   There are no known interactions of carboplatin with food.   This drug may interact with other medicines. Tell your doctor and pharmacist about all the prescription and over-the-counter medicines and dietary supplements (vitamins, minerals, herbs and others) that you are taking at this time. Also, check with your doctor or pharmacist before starting any new prescription or over-the-counter medicines, or dietary supplements to make sure that there are no interactions.   When to Call the Doctor  Call your doctor or nurse if you have any of these symptoms and/or any new or unusual symptoms:  Fever of 100.4 F (38 C) or higher   Chills   Tiredness that interferes with your daily activities   Feeling dizzy or lightheaded   Easy bleeding or bruising   Nausea that stops you from eating or drinking and/or is not relieved by prescribed medicines   Throwing up   Blurred vision or other changes in eyesight    Decrease in hearing or ringing in the ear   Signs of allergic reaction: swelling of the face, feeling like your tongue or throat are swelling, trouble breathing, rash, itching, fever, chills, feeling dizzy, and/or feeling that your heart is beating in a fast or not normal way. If this happens, call 911 for emergency care.   Signs of possible liver problems: dark urine, pale bowel movements, bad stomach pain, feeling very tired and weak, unusual itching, or yellowing of the eyes or skin   Decreased urine, or very dark urine   Numbness, tingling, or pain in your hands and feet   Pain that does not go away or is not relieved by prescribed medicine   While you are getting this drug, please tell your nurse right away if you have any pain, redness, or swelling at the site of the IV infusion, or if you have any new onset of symptoms, or if you just feel different from before when the infusion was started.   Reproduction Warnings   Pregnancy warning: This drug may have harmful effects on the unborn baby. Women of child bearing potential should use effective methods of birth control during your cancer treatment. Let your doctor know right away if you think you may be pregnant.   Breastfeeding warning: It is not known if this drug passes into breast milk. For this reason, women should not breastfeed during treatment because this drug could enter the breast milk and cause harm to a breastfeeding baby.   Fertility warning: Human fertility studies have not been done with this drug. Talk with your doctor or nurse if you plan to have children. Ask for information on sperm or egg banking.   SELF CARE ACTIVITIES WHILE RECEIVING CHEMOTHERAPY:  Hydration Increase your fluid intake 48 hours prior to treatment and drink at least 8 to 12 cups (64 ounces) of water /decaffeinated beverages per day after treatment. You can still have your cup of coffee or soda but these beverages do not count as part of your 8  to 12 cups that you need to drink daily. No alcohol intake.  Medications Continue taking your normal prescription medication as prescribed.  If you start any new herbal or new supplements please let us  know first to make sure it is safe.  Mouth Care Have teeth cleaned professionally before starting treatment. Keep dentures and partial plates clean. Use soft toothbrush and do not use mouthwashes that contain alcohol. Biotene is a good mouthwash that is available at most pharmacies or may be ordered by calling [phone]. Use warm salt water  gargles (1 teaspoon salt per 1 quart warm water ) before and after meals and at bedtime. If you need dental work, please let the doctor know before you go for your appointment so that we can coordinate the best possible time for you in regards to your chemo regimen. You need to also let your dentist know that you are actively taking chemo. We may need to do labs prior to your dental appointment.  Skin Care Always use sunscreen that has not expired and with SPF (Sun Protection Factor) of 50 or higher.  Wear hats to protect your head from the sun. Remember to use sunscreen on your hands, ears, face, & feet.  Use good moisturizing lotions such as udder cream, eucerin, or even Vaseline. Some chemotherapies can cause dry skin, color changes in your skin and nails.    Avoid long, hot showers or baths. Use gentle, fragrance-free soaps and laundry detergent. Use moisturizers, preferably creams or ointments rather than lotions because the thicker consistency is better at preventing skin dehydration. Apply the cream or ointment within 15 minutes of showering. Reapply moisturizer at night, and moisturize your hands every time after you wash them.  Hair Loss (if your doctor says your hair will fall out)  If your doctor says that your hair is likely to fall out, decide before you begin chemo whether you want to wear a wig. You may want to shop before treatment to match your  hair color. Hats, turbans, and scarves can also camouflage hair loss, although some people prefer to leave their heads uncovered. If you go bare-headed outdoors, be sure to use sunscreen on your scalp. Cut your hair short. It eases the inconvenience of shedding lots of hair, but it also can reduce the emotional impact of watching your hair fall out. Don't perm or color your hair during chemotherapy. Those chemical treatments are already damaging to hair and can enhance hair loss. Once your chemo treatments are done and your hair has grown back, it's OK to resume dyeing or perming hair.  With chemotherapy, hair loss is almost always temporary. But when it grows back, it may be a different color or texture. In older adults who still had hair color before chemotherapy, the new growth may be completely gray.  Often, new hair is very fine and soft.  Infection Prevention Please wash your hands for at least 30 seconds using warm soapy water . Handwashing is the #1 way to prevent the spread of germs. Stay away from sick people or people who are getting over a cold. If you develop respiratory systems such as green/yellow mucus production or productive cough or persistent cough let us  know and we will see if you need an antibiotic. It is a good idea to keep a pair of gloves on when going into grocery stores/[employer] to decrease your risk of coming into contact with germs on the carts, etc. Carry alcohol hand gel with you at all times and use it frequently if out in public. If your temperature reaches 100.4 or higher please call the clinic and let us  know.  If it is after hours or on the weekend please go to the ER if your temperature is over 100.4.  Please have your own personal thermometer at home to use.    Sex and bodily fluids If you are going to have sex, a condom must be used to protect the person that isn't taking chemotherapy. Chemo can decrease your libido (sex drive). For a few days after chemotherapy,  chemotherapy can be excreted through your bodily fluids.  When using the toilet please close the lid and flush the toilet twice.  Do this for a few day after you have had chemotherapy.   Effects of chemotherapy on your sex life Some changes are simple and won't last long. They won't affect your sex life permanently.  Sometimes you may feel: too tired not strong enough to be very active sick or sore  not in the mood anxious or low  Your anxiety might not seem related to sex. For example,  you may be worried about the cancer and how your treatment is going. Or you may be worried about money, or about how you family are coping with your illness.  These things can cause stress, which can affect your interest in sex. It's important to talk to your partner about how you feel.  Remember - the changes to your sex life don't usually last long. There's usually no medical reason to stop having sex during chemo. The drugs won't have any long term physical effects on your performance or enjoyment of sex. Cancer can't be passed on to your partner during sex  Contraception It's important to use reliable contraception during treatment. Avoid getting pregnant while you or your partner are having chemotherapy. This is because the drugs may harm the baby. Sometimes chemotherapy drugs can leave a man or woman infertile.  This means you would not be able to have children in the future. You might want to talk to someone about permanent infertility. It can be very difficult to learn that you may no longer be able to have children. Some people find counselling helpful. There might be ways to preserve your fertility, although this is easier for men than for women. You may want to speak to a fertility expert. You can talk about sperm banking or harvesting your eggs. You can also ask about other fertility options, such as donor eggs. If you have or have had breast cancer, your doctor might advise you not to take the  contraceptive pill. This is because the hormones in it might affect the cancer. It is not known for sure whether or not chemotherapy drugs can be passed on through semen or secretions from the vagina. Because of this some doctors advise people to use a barrier method if you have sex during treatment. This applies to vaginal, anal or oral sex. Generally, doctors advise a barrier method only for the time you are actually having the treatment and for about a week after your treatment. Advice like this can be worrying, but this does not mean that you have to avoid being intimate with your partner. You can still have close contact with your partner and continue to enjoy sex.  Animals If you have cats or birds we just ask that you not change the litter or change the cage.  Please have someone else do this for you while you are on chemotherapy.   Food Safety During and After Cancer Treatment Food safety is important for people both during and after cancer treatment. Cancer and cancer treatments, such as chemotherapy, radiation therapy, and stem cell/bone marrow transplantation, often weaken the immune system. This makes it harder for your body to protect itself from foodborne illness, also called food poisoning. Foodborne illness is caused by eating food that contains harmful bacteria, parasites, or viruses.  Foods to avoid Some foods have a higher risk of becoming tainted with bacteria. These include: Unwashed fresh fruit and vegetables, especially leafy vegetables that can hide dirt and other contaminants Raw sprouts, such as alfalfa sprouts Raw or undercooked beef, especially ground beef, or other raw or undercooked meat and poultry Fatty, fried, or spicy foods immediately before or after treatment.  These can sit heavy on your stomach and make you feel nauseous. Raw or undercooked shellfish, such as oysters. Sushi and sashimi, which often contain raw fish.  Unpasteurized beverages, such as unpasteurized  fruit juices, raw milk, raw yogurt, or cider Undercooked eggs, such as soft boiled, over easy, and poached; raw, unpasteurized  eggs; or foods made with raw egg, such as homemade raw cookie dough and homemade mayonnaise  Simple steps for food safety  Shop smart. Do not buy food stored or displayed in an unclean area. Do not buy bruised or damaged fruits or vegetables. Do not buy cans that have cracks, dents, or bulges. Pick up foods that can spoil at the end of your shopping trip and store them in a cooler on the way home.  Prepare and clean up foods carefully. Rinse all fresh fruits and vegetables under running water , and dry them with a clean towel or paper towel. Clean the top of cans before opening them. After preparing food, wash your hands for 20 seconds with hot water  and soap. Pay special attention to areas between fingers and under nails. Clean your utensils and dishes with hot water  and soap. Disinfect your kitchen and cutting boards using 1 teaspoon of liquid, unscented bleach mixed into 1 quart of water .    Dispose of old food. Eat canned and packaged food before its expiration date (the "use by" or "best before" date). Consume refrigerated leftovers within 3 to 4 days. After that time, throw out the food. Even if the food does not smell or look spoiled, it still may be unsafe. Some bacteria, such as Listeria, can grow even on foods stored in the refrigerator if they are kept for too long.  Take precautions when eating out. At restaurants, avoid buffets and salad bars where food sits out for a long time and comes in contact with many people. Food can become contaminated when someone with a virus, often a norovirus, or another "bug" handles it. Put any leftover food in a "to-go" container yourself, rather than having the server do it. And, refrigerate leftovers as soon as you get home. Choose restaurants that are clean and that are willing to prepare your food as you order it  cooked.   AT HOME MEDICATIONS:                                                                                                                                                                Compazine /Prochlorperazine  10mg  tablet. Take 1 tablet every 6 hours as needed for nausea/vomiting. (This can make you sleepy)   EMLA cream. Apply a quarter size amount to port site 1 hour prior to chemo. Do not rub in. Cover with plastic wrap.    Diarrhea Sheet   If you are having loose stools/diarrhea, please purchase Imodium and begin taking as outlined:  At the first sign of poorly formed or loose stools you should begin taking Imodium (loperamide) 2 mg capsules.  Take two tablets (4mg ) followed by one tablet (2mg ) every 2 hours - DO NOT EXCEED 8 tablets in 24 hours.  If  it is bedtime and you are having loose stools, take 2 tablets at bedtime, then 2 tablets every 4 hours until morning.   Always call the Cancer Center if you are having loose stools/diarrhea that you can't get under control.  Loose stools/diarrhea leads to dehydration (loss of water ) in your body.  We have other options of trying to get the loose stools/diarrhea to stop but you must let us  know!   Constipation Sheet  Colace - 100 mg capsules - take 2 capsules daily.  If this doesn't help then you can increase to 2 capsules twice daily.  Please call if the above does not work for you. Do not go more than 2 days without a bowel movement.  It is very important that you do not become constipated.  It will make you feel sick to your stomach (nausea) and can cause abdominal pain and vomiting.  Nausea Sheet   Compazine /Prochlorperazine  10mg  tablet. Take 1 tablet every 6 hours as needed for nausea/vomiting (This can make you drowsy).  If you are having persistent nausea (nausea that does not stop) please call the Cancer Center and let us  know the amount of nausea that you are experiencing.  If you begin to vomit, you need to call the Cancer  Center and if it is the weekend and you have vomited more than one time and can't get it to stop-go to the Emergency Room.  Persistent nausea/vomiting can lead to dehydration (loss of fluid in your body) and will make you feel very weak and unwell. Ice chips, sips of clear liquids, foods that are at room temperature, crackers, and toast tend to be better tolerated.   SYMPTOMS TO REPORT AS SOON AS POSSIBLE AFTER TREATMENT:  FEVER GREATER THAN 100.4 F  CHILLS WITH OR WITHOUT FEVER  NAUSEA AND VOMITING THAT IS NOT CONTROLLED WITH YOUR NAUSEA MEDICATION  UNUSUAL SHORTNESS OF BREATH  UNUSUAL BRUISING OR BLEEDING  TENDERNESS IN MOUTH AND THROAT WITH OR WITHOUT PRESENCE OF ULCERS  URINARY PROBLEMS  BOWEL PROBLEMS  UNUSUAL RASH      Wear comfortable clothing and clothing appropriate for easy access to any Portacath or PICC line. Let us  know if there is anything that we can do to make your therapy better!    What to do if you need assistance after hours or on the weekends: CALL [phone].  HOLD on the line, do not hang up.  You will hear multiple messages but at the end you will be connected with a nurse triage line.  They will contact the doctor if necessary.  Most of the time they will be able to assist you.  Do not call the hospital operator.      I have been informed and understand all of the instructions given to me and have received a copy. I have been instructed to call the clinic [phone] or my family physician as soon as possible for continued medical care, if indicated. I do not have any more questions at this time but understand that I may call the Cancer Center or the Patient Navigator at [phone] during office hours should I have questions or need assistance in obtaining follow-up care.

## 2024-01-24 ENCOUNTER — Encounter: Payer: Self-pay | Admitting: Oncology

## 2024-01-24 NOTE — Progress Notes (Signed)
 [facility] Clinical Social Work  Initial Assessment   Peter Lambert is a 78 y.o. year old male. Clinical Social Work was referred by medical provider for assessment of psychosocial needs.  Details for assessment provided by pt's son [name].    SDOH (Social Determinants of Health) assessments performed: Yes   SDOH Screenings   Food Insecurity: No Food Insecurity (01/15/2024)  Housing: Low Risk  (01/15/2024)  Transportation Needs: No Transportation Needs (01/15/2024)  Utilities: Not At Risk (01/15/2024)  Depression (PHQ2-9): Low Risk  (01/21/2024)  Social Connections: Moderately Integrated (01/15/2024)  Tobacco Use: Medium Risk (01/13/2024)    PHQ 2/9:    01/21/2024    1:55 PM 12/27/2023    8:34 AM  Depression screen PHQ 2/9  Decreased Interest 0 0  Down, Depressed, Hopeless 0 0  PHQ - 2 Score 0 0     Distress Screen completed: No     No data to display            Family/Social Information:  Housing Arrangement: patient lives alone, per son pt's other son Brittin and sister Gaylan reside close to pt and check on him regularly.  Pt's son [name] resides in [place] and primarily takes care of managing pt's finances and medical appointments. Family members/support persons in your life? Pt's son Quadir and sister Gaylan will primarily take care of pt.  Pt was discharged from the hospital w/ home care through [facility].  Pt utilizes a cane and walker at home, as well as oxygen .   Transportation concerns: no, Bethany and Gaylan are anticipated to provide transportation  Employment: Retired .  Income source: Actor concerns: No Type of concern: None Food access concerns: no Religious or spiritual practice: Not known Advanced directives: Not known Services Currently in place:  [facility]  Coping/ Adjustment to diagnosis: Patient understands treatment plan and what happens next? yes Concerns about diagnosis and/or treatment: Quality of  life Patient reported stressors: Adjusting to my illness Hopes and/or priorities: pt's priority is to start treatment w/ the hope of positive results. Patient enjoys not addressed Current coping skills/ strengths: Motivation for treatment/growth  and Supportive family/friends     SUMMARY: Current SDOH Barriers:  No barriers identified at this time  Clinical Social Work Clinical Goal(s):  No clinical social work goals at this time  Interventions: Discussed common feeling and emotions when being diagnosed with cancer, and the importance of support during treatment Informed patient of the support team roles and support services at [facility] Provided CSW contact information and encouraged patient to call with any questions or concerns Pt referred to Wadie Rung for additional support.  Pt's son informed of the [employer] if any additional DME may be needed.    Follow Up Plan: Patient will contact CSW with any support or resource needs Patient verbalizes understanding of plan: Yes    Devere SAUNDERS Manna, LCSW Clinical Social Worker [facility]

## 2024-01-25 ENCOUNTER — Emergency Department (HOSPITAL_COMMUNITY)
Admission: EM | Admit: 2024-01-25 | Discharge: 2024-01-25 | Disposition: A | Discharge status: Home / Self Care | Attending: Emergency Medicine | Admitting: Emergency Medicine

## 2024-01-25 ENCOUNTER — Emergency Department (HOSPITAL_COMMUNITY)

## 2024-01-25 ENCOUNTER — Encounter (HOSPITAL_COMMUNITY): Payer: Self-pay | Admitting: Emergency Medicine

## 2024-01-25 ENCOUNTER — Other Ambulatory Visit: Payer: Self-pay

## 2024-01-25 DIAGNOSIS — Z7901 Long term (current) use of anticoagulants: Secondary | ICD-10-CM | POA: Diagnosis not present

## 2024-01-25 DIAGNOSIS — M79661 Pain in right lower leg: Secondary | ICD-10-CM | POA: Diagnosis not present

## 2024-01-25 DIAGNOSIS — M79604 Pain in right leg: Secondary | ICD-10-CM

## 2024-01-25 DIAGNOSIS — R000 Tachycardia, unspecified: Secondary | ICD-10-CM | POA: Diagnosis not present

## 2024-01-25 DIAGNOSIS — C799 Secondary malignant neoplasm of unspecified site: Secondary | ICD-10-CM

## 2024-01-25 DIAGNOSIS — C7951 Secondary malignant neoplasm of bone: Secondary | ICD-10-CM | POA: Diagnosis not present

## 2024-01-25 DIAGNOSIS — C3490 Malignant neoplasm of unspecified part of unspecified bronchus or lung: Secondary | ICD-10-CM | POA: Diagnosis not present

## 2024-01-25 DIAGNOSIS — M1711 Unilateral primary osteoarthritis, right knee: Secondary | ICD-10-CM | POA: Diagnosis not present

## 2024-01-25 MED ORDER — OXYCODONE-ACETAMINOPHEN 5-325 MG PO TABS
2.0000 | ORAL_TABLET | Freq: Once | ORAL | Status: AC
  Administered 2024-01-25: 2 via ORAL
  Filled 2024-01-25: qty 2

## 2024-01-25 MED ORDER — COLCHICINE 0.6 MG PO TABS: 0.6000 mg | ORAL_TABLET | Freq: Two times a day (BID) | ORAL | 0 refills | Status: DC

## 2024-01-25 NOTE — ED Provider Notes (Signed)
 [place] EMERGENCY DEPARTMENT AT [facility] Provider Note   CSN: [id number] Arrival date & time: 01/25/24  8144     Patient presents with: Leg Pain   Peter Lambert is a 78 y.o. male.    Leg Pain    This patient is an 78 year old male, he has a history of malignant lung cancer, he is followed by oncology here at [facility] most recently seen 4 days ago, notes from that visit show that the patient does have stage IV lung cancer they recommended starting chemotherapy  The patient had been admitted on November 17 through November 23, during that time the patient was noted to have atrial fibrillation on Eliquis , history of cardiac arrest because of coronary disease in the past.  History of aortofemoral bypass grafting, he had had some increasing back pain and was admitted to the hospital.  He has a Chemo-Port which was placed on November 21.  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  The patient has had ongoing pain but states today is actually been a good day with less pain feeling better, states that when he is more active the pain is less.  He denies any changes in the appearance of his legs.  He states he is here because his son wanted him to get checked out.  MRI of the thoracic and lumbar spines was performed on November 17 which showed T11 fracture moderate spinal stenosis at that area but no evidence of tumor or spinal stenosis in the lumbar spine  Prior to Admission medications   Medication Sig Start Date End Date Taking? Authorizing Provider  colchicine  0.6 MG tablet Take 1 tablet (0.6 mg total) by mouth 2 (two) times daily. 01/25/24  Yes Cleotilde Rogue, MD   acetaminophen  (TYLENOL ) 500 MG tablet Take 2 tablets (1,000 mg total) by mouth 3 (three) times daily. 01/19/24   Amin, Burgess BROCKS, MD  albuterol  (VENTOLIN  HFA) 108 (90 Base) MCG/ACT inhaler Inhale 2 puffs into the lungs every 6 (six) hours as needed for wheezing or shortness of breath. 01/19/24   Amin, Ankit C, MD  apixaban  (ELIQUIS ) 5 MG TABS tablet Take 1 tablet (5 mg total) by mouth 2 (two) times daily. Hold Eliquis  on 01/05/2024 for bronchoscopy 01/03/24   Odell Celinda Balo, MD  atorvastatin  (LIPITOR ) 80 MG tablet Take 80 mg by mouth daily.    [provider]  carvedilol  (COREG ) 3.125 MG tablet Take 1 tablet (3.125 mg total) by mouth daily with breakfast. 05/04/20 01/01/25  Shona Terry SAILOR, DO  [employer] GENTLE LAXATIVE 5 MG EC tablet TAKE 1 TABLET BY MOUTH DAILY AS NEEDED FOR MODERATE CONSTIPATION. TAKE TWO DAYS BEFORE PROCEDURE Patient taking differently: Take 5 mg by mouth daily as needed for mild constipation or moderate constipation. 05/30/20   Castaneda Mayorga, Daniel, MD  dexamethasone  (DECADRON ) 4 MG tablet Take 1 tablet (4 mg total) by mouth every 8 (eight) hours for 2 days, THEN 1 tablet (4 mg total) 2 (two) times daily for 2 days, THEN 1 tablet (4 mg total) 2 (two) times daily for 7 days. 01/19/24 01/30/24  Amin, Ankit C, MD  feeding supplement (ENSURE PLUS HIGH PROTEIN) LIQD Take 237 mLs by mouth  3 (three) times daily between meals. 01/19/24   Amin, Ankit C, MD  ferrous sulfate  (FERROUSUL) 325 (65 FE) MG tablet Take 1 tablet (325 mg total) by mouth daily with breakfast. 01/19/20   Rehman, Claudis PENNER, MD  furosemide  (LASIX ) 20 MG tablet Take 20 mg by mouth daily.    [provider]  levothyroxine  (SYNTHROID ) 50 MCG tablet Take 50 mcg by mouth daily before breakfast.    [provider]  losartan  (COZAAR ) 25 MG tablet Take 25 mg by mouth daily. 01/14/21   [provider]  Multiple Vitamin (MULTIVITAMIN WITH MINERALS) TABS tablet Take 1 tablet by mouth daily. 01/19/24    Amin, Ankit C, MD  nitroGLYCERIN  (NITROSTAT ) 0.4 MG SL tablet Place 1 tablet (0.4 mg total) under the tongue every 5 (five) minutes as needed for chest pain. Patient must keep upcoming appointment for further refills 07/20/21   Court Dorn PARAS, MD  omeprazole  (PRILOSEC) 20 MG capsule Take 20 mg by mouth daily after lunch.    [provider]  oxyCODONE  (OXY IR/ROXICODONE ) 5 MG immediate release tablet Take 1-2 tablets (5-10 mg total) by mouth every 4 (four) hours as needed for moderate pain (pain score 4-6), severe pain (pain score 7-10) or breakthrough pain. 01/19/24   Amin, Ankit C, MD  polyethylene glycol powder (MIRALAX ) 17 GM/SCOOP powder Take 17 g by mouth daily as needed for moderate constipation or mild constipation. Dissolve 1 capful (17g) in 4-8 ounces of liquid and take by mouth daily. 01/19/24   Amin, Ankit C, MD  potassium chloride  SA (KLOR-CON ) 20 MEQ tablet Take 20 mEq by mouth daily after lunch.    [provider]  senna-docusate (SENOKOT-S) 8.6-50 MG tablet Take 1 tablet by mouth at bedtime. 01/19/24   Amin, Ankit C, MD  spironolactone  (ALDACTONE ) 50 MG tablet Take 50 mg by mouth daily. 02/11/21   [provider]  potassium chloride  (KLOR-CON ) 20 MEQ packet TAKE 1 TABLET BY MOUTH EVERY DAY 12/24/12 07/14/13  Court Dorn PARAS, MD    Allergies: Patient has no known allergies.    Review of Systems  All other systems reviewed and are negative.   Updated Vital Signs BP 121/68 (BP Location: Left Arm)   Pulse (!) 125   Temp 99.2 F (37.3 C) (Oral)   Resp 18   Ht 1.727 m (5' 8)   Wt 65.8 kg   SpO2 93%   BMI 22.05 kg/m   Physical Exam Vitals and nursing note reviewed.  Constitutional:      General: He is not in acute distress.    Appearance: He is well-developed.  HENT:     Head: Normocephalic and atraumatic.     Mouth/Throat:     Pharynx: No oropharyngeal exudate.  Eyes:     General: No scleral icterus.       Right eye: No discharge.         Left eye: No discharge.     Conjunctiva/sclera: Conjunctivae normal.     Pupils: Pupils are equal, round, and reactive to light.  Neck:     Thyroid : No thyromegaly.     Vascular: No JVD.  Cardiovascular:     Rate and Rhythm: Regular rhythm. Tachycardia present.     Heart sounds: Normal heart sounds. No murmur heard.    No friction rub. No gallop.     Comments: Mild tachycardia Pulmonary:     Effort: Pulmonary effort is normal. No respiratory distress.     Breath sounds: Normal breath  sounds. No wheezing or rales.  Abdominal:     General: Bowel sounds are normal. There is no distension.     Palpations: Abdomen is soft. There is no mass.     Tenderness: There is no abdominal tenderness.  Musculoskeletal:        General: Tenderness present. Normal range of motion.     Cervical back: Normal range of motion and neck supple.     Right lower leg: No edema.     Left lower leg: No edema.     Comments: Mild tenderness with any range of motion of the right lower extremity.  He is able to straight leg raise and flex at the hip and the knee, the pain is primarily in the calf.  He has no signs of swelling or redness, his feet are bilaterally warm with normal capillary refill although the pulses are difficult to detect bilaterally.  Lymphadenopathy:     Cervical: No cervical adenopathy.  Skin:    General: Skin is warm and dry.     Findings: No erythema or rash.  Neurological:     Mental Status: He is alert.     Coordination: Coordination normal.  Psychiatric:        Behavior: Behavior normal.     (all labs ordered are listed, but only abnormal results are displayed) Labs Reviewed - No data to display  EKG: None  Radiology: DG Tibia/Fibula Right Result Date: 01/25/2024 CLINICAL DATA:  No lung cancer with metastases.  Leg pain. EXAM: RIGHT TIBIA AND FIBULA - 2 VIEW COMPARISON:  None Available. FINDINGS: There is no evidence for acute fracture or dislocation. There is a cortical  exostosis from the posteromedial proximal femoral diaphysis measuring 2.0 by 0.4 by 0.4 cm. No other focal lesions are identified. There are moderate degenerative changes of the knee with joint space narrowing and osteophyte formation. Soft tissues appear within normal limits. IMPRESSION: 1. No acute fracture or dislocation. 2. Cortical exostosis from the posteromedial proximal femoral diaphysis measuring 2.0 x 0.4 x 0.4 cm. This may represent an osteochondroma. Correlate clinically for point tenderness. Electronically Signed   By: Greig Pique M.D.   On: 01/25/2024 20:04     Procedures   Medications Ordered in the ED  oxyCODONE -acetaminophen  (PERCOCET/ROXICET) 5-325 MG per tablet 2 tablet (2 tablets Oral Given 01/25/24 2011)                                    Medical Decision Making Amount and/or Complexity of Data Reviewed Radiology: ordered.  Risk Prescription drug management.   The cause of the patient's pain is unclear but I suspect it is related to either a radicular pain or it could be related to bony metastases.  I have reviewed the PET scan and the MRIs that were performed in the last month.  The patient has a mild tachycardia but he is not febrile he is not short of breath, his heart rate is about 105 for me.  I see no signs of edema, he is already on Eliquis  so I do not think this is related to DVT or thromboembolism.  I have reviewed the medication record including the PDMP database and it shows that the patient is prescribed oxycodone , tramadol , fentanyl  patches for pain related to his cancer.  I have an x-ray that shows no obvious answer to the patient's pain, he has stated to me that he is feeling  better, he continues to be a little tachycardic but does not want to stay in the hospital, he request discharge, I think this is reasonable given his advanced metastatic cancer.  I will make sure that he has some additional treatment for gout as the family member has now showed up  and states that he does have a history of gout in the past which he has had multiple times and now is complaining of pain especially when you touch his foot.  I have not seen that much pain when I examine his foot however he does have some pain when I take his sock on and off.  There is no findings of skin changes to the foot to suggest cellulitis or necrosis, will proceed to treat with some colchicine  as well.  He is already on Decadron  tablets which were prescribed on 23 November  The patient and the family member are in agreement with the plan     Final diagnoses:  Right leg pain  Metastatic malignant neoplasm, unspecified site [facility])    ED Discharge Orders          Ordered    colchicine  0.6 MG tablet  2 times daily        01/25/24 2211               Cleotilde Rogue, MD 01/25/24 2211

## 2024-01-25 NOTE — ED Triage Notes (Signed)
 Patient BIB EMS for evaluation of R lower leg pain.  Reports pain started this summer.  Has been getting worse.  No reports recent falls.  States the pain is better today because I have been doing exercises, but my son wanted me to come get checked.  Hx of cancer.  Last radiation therapy was on Wednesday.  No reports of fever.

## 2024-01-25 NOTE — Discharge Instructions (Signed)
 Please take colchicine  twice a day for 7 days, if the pain completely resolves you can stop the colchicine .  If it causes diarrhea stop the medication.  See your doctor in 48 hours for recheck  Emergency department for severe worsening symptoms

## 2024-01-26 ENCOUNTER — Encounter: Payer: Self-pay | Admitting: Oncology

## 2024-01-26 DIAGNOSIS — C3490 Malignant neoplasm of unspecified part of unspecified bronchus or lung: Secondary | ICD-10-CM | POA: Insufficient documentation

## 2024-01-26 NOTE — Progress Notes (Signed)
 START ON PATHWAY REGIMEN - Non-Small Cell Lung     A cycle is every 21 days:     Paclitaxel      Carboplatin   **Always confirm dose/schedule in your pharmacy ordering system**  Patient Characteristics: Stage IV Metastatic, Squamous, Awaiting Molecular Test Results and Need to Start Chemotherapy, PS = 0 - 2 Therapeutic Status: Stage IV Metastatic Histology: Squamous Cell ECOG Performance Status: 2 Intent of Therapy: Non-Curative / Palliative Intent, Discussed with Patient

## 2024-01-27 ENCOUNTER — Inpatient Hospital Stay: Attending: Oncology

## 2024-01-27 ENCOUNTER — Ambulatory Visit

## 2024-01-27 ENCOUNTER — Other Ambulatory Visit: Payer: Self-pay

## 2024-01-27 ENCOUNTER — Other Ambulatory Visit: Payer: Self-pay | Admitting: *Deleted

## 2024-01-27 ENCOUNTER — Ambulatory Visit (HOSPITAL_COMMUNITY)
Admission: RE | Admit: 2024-01-27 | Discharge: 2024-01-27 | Disposition: A | Source: Ambulatory Visit | Attending: Oncology

## 2024-01-27 DIAGNOSIS — C3411 Malignant neoplasm of upper lobe, right bronchus or lung: Secondary | ICD-10-CM | POA: Insufficient documentation

## 2024-01-27 DIAGNOSIS — C771 Secondary and unspecified malignant neoplasm of intrathoracic lymph nodes: Secondary | ICD-10-CM | POA: Insufficient documentation

## 2024-01-27 DIAGNOSIS — R0602 Shortness of breath: Secondary | ICD-10-CM | POA: Insufficient documentation

## 2024-01-27 DIAGNOSIS — Z79899 Other long term (current) drug therapy: Secondary | ICD-10-CM | POA: Insufficient documentation

## 2024-01-27 DIAGNOSIS — C7951 Secondary malignant neoplasm of bone: Secondary | ICD-10-CM | POA: Insufficient documentation

## 2024-01-27 DIAGNOSIS — R059 Cough, unspecified: Secondary | ICD-10-CM | POA: Insufficient documentation

## 2024-01-27 DIAGNOSIS — R200 Anesthesia of skin: Secondary | ICD-10-CM

## 2024-01-27 DIAGNOSIS — C3490 Malignant neoplasm of unspecified part of unspecified bronchus or lung: Secondary | ICD-10-CM

## 2024-01-27 DIAGNOSIS — R918 Other nonspecific abnormal finding of lung field: Secondary | ICD-10-CM | POA: Diagnosis not present

## 2024-01-27 DIAGNOSIS — Z5111 Encounter for antineoplastic chemotherapy: Secondary | ICD-10-CM | POA: Insufficient documentation

## 2024-01-27 DIAGNOSIS — K6389 Other specified diseases of intestine: Secondary | ICD-10-CM | POA: Diagnosis not present

## 2024-01-27 MED ORDER — DEXAMETHASONE 4 MG PO TABS: ORAL_TABLET | ORAL | 1 refills | Status: DC

## 2024-01-27 MED ORDER — PROCHLORPERAZINE MALEATE 10 MG PO TABS: 10.0000 mg | ORAL_TABLET | Freq: Four times a day (QID) | ORAL | 1 refills | Status: DC | PRN

## 2024-01-27 MED ORDER — ONDANSETRON HCL 8 MG PO TABS: 8.0000 mg | ORAL_TABLET | Freq: Three times a day (TID) | ORAL | 1 refills | Status: DC | PRN

## 2024-01-27 MED ORDER — LIDOCAINE-PRILOCAINE 2.5-2.5 % EX CREA: 1.0000 | TOPICAL_CREAM | CUTANEOUS | 3 refills | Status: DC | PRN

## 2024-01-27 NOTE — Progress Notes (Signed)

## 2024-01-27 NOTE — Progress Notes (Signed)
  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)  01/14/2024 Initial Diagnosis   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)   01/16/2024 Cancer Staging   Staging form: Lung, AJCC V9 - Clinical: Stage IVB (cTX, cNX, cM1c1) - Signed by Autumn Millman, MD on 01/16/2024   Malignant neoplasm of unspecified part of unspecified bronchus or lung (HCC)  01/26/2024 Initial Diagnosis   Malignant neoplasm of unspecified part of unspecified bronchus or lung (HCC)   01/28/2024 -  Chemotherapy   Patient is on Treatment Plan : LUNG Carboplatin + Paclitaxel q21d       Cancer Staging  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

## 2024-01-28 ENCOUNTER — Encounter (HOSPITAL_COMMUNITY): Payer: Self-pay | Admitting: Emergency Medicine

## 2024-01-28 ENCOUNTER — Inpatient Hospital Stay

## 2024-01-28 ENCOUNTER — Emergency Department (HOSPITAL_COMMUNITY)

## 2024-01-28 ENCOUNTER — Other Ambulatory Visit: Payer: Self-pay

## 2024-01-28 ENCOUNTER — Inpatient Hospital Stay (HOSPITAL_COMMUNITY): Admission: EM | Admit: 2024-01-28 | Discharge: 2024-02-27 | DRG: 314 | Disposition: E | Source: Ambulatory Visit

## 2024-01-28 ENCOUNTER — Ambulatory Visit

## 2024-01-28 VITALS — BP 110/63 | HR 92 | Temp 97.8°F | Resp 18 | Wt 145.0 lb

## 2024-01-28 DIAGNOSIS — E872 Acidosis, unspecified: Secondary | ICD-10-CM

## 2024-01-28 DIAGNOSIS — L89156 Pressure-induced deep tissue damage of sacral region: Secondary | ICD-10-CM | POA: Diagnosis present

## 2024-01-28 DIAGNOSIS — C3490 Malignant neoplasm of unspecified part of unspecified bronchus or lung: Secondary | ICD-10-CM

## 2024-01-28 DIAGNOSIS — I130 Hypertensive heart and chronic kidney disease with heart failure and stage 1 through stage 4 chronic kidney disease, or unspecified chronic kidney disease: Secondary | ICD-10-CM | POA: Diagnosis present

## 2024-01-28 DIAGNOSIS — R339 Retention of urine, unspecified: Secondary | ICD-10-CM | POA: Diagnosis present

## 2024-01-28 DIAGNOSIS — E883 Tumor lysis syndrome: Secondary | ICD-10-CM | POA: Diagnosis present

## 2024-01-28 DIAGNOSIS — Z955 Presence of coronary angioplasty implant and graft: Secondary | ICD-10-CM

## 2024-01-28 DIAGNOSIS — I5043 Acute on chronic combined systolic (congestive) and diastolic (congestive) heart failure: Secondary | ICD-10-CM | POA: Diagnosis present

## 2024-01-28 DIAGNOSIS — Z515 Encounter for palliative care: Secondary | ICD-10-CM | POA: Diagnosis not present

## 2024-01-28 DIAGNOSIS — Z7401 Bed confinement status: Secondary | ICD-10-CM

## 2024-01-28 DIAGNOSIS — G893 Neoplasm related pain (acute) (chronic): Secondary | ICD-10-CM | POA: Diagnosis present

## 2024-01-28 DIAGNOSIS — I509 Heart failure, unspecified: Secondary | ICD-10-CM | POA: Diagnosis not present

## 2024-01-28 DIAGNOSIS — Z800 Family history of malignant neoplasm of digestive organs: Secondary | ICD-10-CM

## 2024-01-28 DIAGNOSIS — J90 Pleural effusion, not elsewhere classified: Secondary | ICD-10-CM | POA: Diagnosis not present

## 2024-01-28 DIAGNOSIS — D649 Anemia, unspecified: Principal | ICD-10-CM | POA: Diagnosis present

## 2024-01-28 DIAGNOSIS — L89150 Pressure ulcer of sacral region, unstageable: Secondary | ICD-10-CM | POA: Diagnosis present

## 2024-01-28 DIAGNOSIS — E039 Hypothyroidism, unspecified: Secondary | ICD-10-CM | POA: Diagnosis present

## 2024-01-28 DIAGNOSIS — K7200 Acute and subacute hepatic failure without coma: Secondary | ICD-10-CM | POA: Diagnosis present

## 2024-01-28 DIAGNOSIS — R918 Other nonspecific abnormal finding of lung field: Secondary | ICD-10-CM | POA: Diagnosis not present

## 2024-01-28 DIAGNOSIS — Z9889 Other specified postprocedural states: Secondary | ICD-10-CM

## 2024-01-28 DIAGNOSIS — R197 Diarrhea, unspecified: Secondary | ICD-10-CM | POA: Diagnosis not present

## 2024-01-28 DIAGNOSIS — Z79899 Other long term (current) drug therapy: Secondary | ICD-10-CM

## 2024-01-28 DIAGNOSIS — R748 Abnormal levels of other serum enzymes: Secondary | ICD-10-CM | POA: Diagnosis present

## 2024-01-28 DIAGNOSIS — I5021 Acute systolic (congestive) heart failure: Secondary | ICD-10-CM | POA: Diagnosis not present

## 2024-01-28 DIAGNOSIS — J449 Chronic obstructive pulmonary disease, unspecified: Secondary | ICD-10-CM | POA: Diagnosis present

## 2024-01-28 DIAGNOSIS — N179 Acute kidney failure, unspecified: Secondary | ICD-10-CM | POA: Diagnosis not present

## 2024-01-28 DIAGNOSIS — C7951 Secondary malignant neoplasm of bone: Secondary | ICD-10-CM | POA: Diagnosis present

## 2024-01-28 DIAGNOSIS — Z66 Do not resuscitate: Secondary | ICD-10-CM | POA: Diagnosis present

## 2024-01-28 DIAGNOSIS — R570 Cardiogenic shock: Secondary | ICD-10-CM | POA: Diagnosis present

## 2024-01-28 DIAGNOSIS — Z820 Family history of epilepsy and other diseases of the nervous system: Secondary | ICD-10-CM

## 2024-01-28 DIAGNOSIS — R000 Tachycardia, unspecified: Secondary | ICD-10-CM | POA: Diagnosis not present

## 2024-01-28 DIAGNOSIS — R680 Hypothermia, not associated with low environmental temperature: Secondary | ICD-10-CM | POA: Diagnosis present

## 2024-01-28 DIAGNOSIS — R634 Abnormal weight loss: Secondary | ICD-10-CM | POA: Diagnosis not present

## 2024-01-28 DIAGNOSIS — Z720 Tobacco use: Secondary | ICD-10-CM | POA: Diagnosis not present

## 2024-01-28 DIAGNOSIS — Z1152 Encounter for screening for COVID-19: Secondary | ICD-10-CM

## 2024-01-28 DIAGNOSIS — Z8042 Family history of malignant neoplasm of prostate: Secondary | ICD-10-CM

## 2024-01-28 DIAGNOSIS — E875 Hyperkalemia: Secondary | ICD-10-CM | POA: Diagnosis not present

## 2024-01-28 DIAGNOSIS — Z95810 Presence of automatic (implantable) cardiac defibrillator: Secondary | ICD-10-CM

## 2024-01-28 DIAGNOSIS — Z8674 Personal history of sudden cardiac arrest: Secondary | ICD-10-CM

## 2024-01-28 DIAGNOSIS — Z8719 Personal history of other diseases of the digestive system: Secondary | ICD-10-CM

## 2024-01-28 DIAGNOSIS — I5181 Takotsubo syndrome: Principal | ICD-10-CM | POA: Diagnosis present

## 2024-01-28 DIAGNOSIS — T451X5A Adverse effect of antineoplastic and immunosuppressive drugs, initial encounter: Secondary | ICD-10-CM | POA: Diagnosis present

## 2024-01-28 DIAGNOSIS — E785 Hyperlipidemia, unspecified: Secondary | ICD-10-CM | POA: Diagnosis present

## 2024-01-28 DIAGNOSIS — C3411 Malignant neoplasm of upper lobe, right bronchus or lung: Secondary | ICD-10-CM | POA: Diagnosis present

## 2024-01-28 DIAGNOSIS — D696 Thrombocytopenia, unspecified: Secondary | ICD-10-CM | POA: Diagnosis present

## 2024-01-28 DIAGNOSIS — R579 Shock, unspecified: Secondary | ICD-10-CM | POA: Diagnosis not present

## 2024-01-28 DIAGNOSIS — R0602 Shortness of breath: Secondary | ICD-10-CM | POA: Diagnosis present

## 2024-01-28 DIAGNOSIS — K219 Gastro-esophageal reflux disease without esophagitis: Secondary | ICD-10-CM | POA: Diagnosis present

## 2024-01-28 DIAGNOSIS — Z7901 Long term (current) use of anticoagulants: Secondary | ICD-10-CM

## 2024-01-28 DIAGNOSIS — R590 Localized enlarged lymph nodes: Secondary | ICD-10-CM | POA: Diagnosis present

## 2024-01-28 DIAGNOSIS — E119 Type 2 diabetes mellitus without complications: Secondary | ICD-10-CM | POA: Diagnosis not present

## 2024-01-28 DIAGNOSIS — I252 Old myocardial infarction: Secondary | ICD-10-CM

## 2024-01-28 DIAGNOSIS — C771 Secondary and unspecified malignant neoplasm of intrathoracic lymph nodes: Secondary | ICD-10-CM | POA: Diagnosis present

## 2024-01-28 DIAGNOSIS — Z7189 Other specified counseling: Secondary | ICD-10-CM | POA: Diagnosis not present

## 2024-01-28 DIAGNOSIS — G9341 Metabolic encephalopathy: Secondary | ICD-10-CM | POA: Diagnosis not present

## 2024-01-28 DIAGNOSIS — E1122 Type 2 diabetes mellitus with diabetic chronic kidney disease: Secondary | ICD-10-CM | POA: Diagnosis present

## 2024-01-28 DIAGNOSIS — R159 Full incontinence of feces: Secondary | ICD-10-CM | POA: Diagnosis present

## 2024-01-28 DIAGNOSIS — Z87891 Personal history of nicotine dependence: Secondary | ICD-10-CM

## 2024-01-28 DIAGNOSIS — E1151 Type 2 diabetes mellitus with diabetic peripheral angiopathy without gangrene: Secondary | ICD-10-CM | POA: Diagnosis present

## 2024-01-28 DIAGNOSIS — N184 Chronic kidney disease, stage 4 (severe): Secondary | ICD-10-CM | POA: Diagnosis present

## 2024-01-28 DIAGNOSIS — I482 Chronic atrial fibrillation, unspecified: Secondary | ICD-10-CM | POA: Diagnosis present

## 2024-01-28 DIAGNOSIS — I2510 Atherosclerotic heart disease of native coronary artery without angina pectoris: Secondary | ICD-10-CM | POA: Diagnosis present

## 2024-01-28 DIAGNOSIS — Z809 Family history of malignant neoplasm, unspecified: Secondary | ICD-10-CM

## 2024-01-28 DIAGNOSIS — M109 Gout, unspecified: Secondary | ICD-10-CM | POA: Diagnosis present

## 2024-01-28 DIAGNOSIS — R54 Age-related physical debility: Secondary | ICD-10-CM | POA: Diagnosis present

## 2024-01-28 DIAGNOSIS — I4891 Unspecified atrial fibrillation: Secondary | ICD-10-CM | POA: Diagnosis not present

## 2024-01-28 DIAGNOSIS — J439 Emphysema, unspecified: Secondary | ICD-10-CM | POA: Diagnosis not present

## 2024-01-28 DIAGNOSIS — Z79890 Hormone replacement therapy (postmenopausal): Secondary | ICD-10-CM

## 2024-01-28 DIAGNOSIS — Z5111 Encounter for antineoplastic chemotherapy: Secondary | ICD-10-CM | POA: Diagnosis present

## 2024-01-28 DIAGNOSIS — R059 Cough, unspecified: Secondary | ICD-10-CM | POA: Diagnosis present

## 2024-01-28 DIAGNOSIS — M6258 Muscle wasting and atrophy, not elsewhere classified, other site: Secondary | ICD-10-CM | POA: Diagnosis present

## 2024-01-28 DIAGNOSIS — M25561 Pain in right knee: Secondary | ICD-10-CM | POA: Diagnosis present

## 2024-01-28 HISTORY — DX: Malignant neoplasm of unspecified part of right bronchus or lung: C34.91

## 2024-01-28 LAB — CBC WITH DIFFERENTIAL/PLATELET
Abs Immature Granulocytes: 0.4 K/uL — ABNORMAL HIGH (ref 0.00–0.07)
Band Neutrophils: 4 %
Basophils Absolute: 0 K/uL (ref 0.0–0.1)
Basophils Relative: 0 %
Eosinophils Absolute: 0 K/uL (ref 0.0–0.5)
Eosinophils Relative: 0 %
HCT: 23.9 % — ABNORMAL LOW (ref 39.0–52.0)
Hemoglobin: 7.4 g/dL — ABNORMAL LOW (ref 13.0–17.0)
Lymphocytes Relative: 5 %
Lymphs Abs: 0.3 K/uL — ABNORMAL LOW (ref 0.7–4.0)
MCH: 25.9 pg — ABNORMAL LOW (ref 26.0–34.0)
MCHC: 31 g/dL (ref 30.0–36.0)
MCV: 83.6 fL (ref 80.0–100.0)
Metamyelocytes Relative: 3 %
Monocytes Absolute: 0.1 K/uL (ref 0.1–1.0)
Monocytes Relative: 1 %
Myelocytes: 3 %
Neutro Abs: 4.4 K/uL (ref 1.7–7.7)
Neutrophils Relative %: 83 %
Platelets: 122 K/uL — ABNORMAL LOW (ref 150–400)
Promyelocytes Relative: 1 %
RBC: 2.86 MIL/uL — ABNORMAL LOW (ref 4.22–5.81)
RDW: 17 % — ABNORMAL HIGH (ref 11.5–15.5)
WBC: 5 K/uL (ref 4.0–10.5)
nRBC: 1.2 % — ABNORMAL HIGH (ref 0.0–0.2)

## 2024-01-28 LAB — URINALYSIS, W/ REFLEX TO CULTURE (INFECTION SUSPECTED)
Bilirubin Urine: NEGATIVE
Glucose, UA: NEGATIVE mg/dL
Hgb urine dipstick: NEGATIVE
Ketones, ur: NEGATIVE mg/dL
Leukocytes,Ua: NEGATIVE
Nitrite: NEGATIVE
Protein, ur: 30 mg/dL — AB
Specific Gravity, Urine: 1.017 (ref 1.005–1.030)
pH: 5 (ref 5.0–8.0)

## 2024-01-28 LAB — BLOOD GAS, VENOUS
Acid-base deficit: 11.9 mmol/L — ABNORMAL HIGH (ref 0.0–2.0)
Acid-base deficit: 7.4 mmol/L — ABNORMAL HIGH (ref 0.0–2.0)
Bicarbonate: 16 mmol/L — ABNORMAL LOW (ref 20.0–28.0)
Bicarbonate: 19.3 mmol/L — ABNORMAL LOW (ref 20.0–28.0)
Drawn by: 4237
Drawn by: 442
O2 Saturation: 12.9 %
O2 Saturation: 52.6 %
Patient temperature: 36.6
Patient temperature: 36.3
pCO2, Ven: 43 mmHg — ABNORMAL LOW (ref 44–60)
pCO2, Ven: 43 mmHg — ABNORMAL LOW (ref 44–60)
pH, Ven: 7.18 — CL (ref 7.25–7.43)
pH, Ven: 7.26 (ref 7.25–7.43)
pO2, Ven: <31 mmHg — CL (ref 32–45)
pO2, Ven: 37 mmHg (ref 32–45)

## 2024-01-28 LAB — COMPREHENSIVE METABOLIC PANEL WITH GFR
ALT: 827 U/L — ABNORMAL HIGH (ref 0–44)
AST: 1630 U/L — ABNORMAL HIGH (ref 15–41)
Albumin: 3 g/dL — ABNORMAL LOW (ref 3.5–5.0)
Alkaline Phosphatase: 161 U/L — ABNORMAL HIGH (ref 38–126)
Anion gap: 24 — ABNORMAL HIGH (ref 5–15)
BUN: 69 mg/dL — ABNORMAL HIGH (ref 8–23)
CO2: 12 mmol/L — ABNORMAL LOW (ref 22–32)
Calcium: 7.9 mg/dL — ABNORMAL LOW (ref 8.9–10.3)
Chloride: 101 mmol/L (ref 98–111)
Creatinine, Ser: 1.95 mg/dL — ABNORMAL HIGH (ref 0.61–1.24)
GFR, Estimated: 34 mL/min — ABNORMAL LOW (ref >60)
Glucose, Bld: 93 mg/dL (ref 70–99)
Potassium: 6 mmol/L — ABNORMAL HIGH (ref 3.5–5.1)
Sodium: 137 mmol/L (ref 135–145)
Total Bilirubin: 2 mg/dL — ABNORMAL HIGH (ref 0.0–1.2)
Total Protein: 6.1 g/dL — ABNORMAL LOW (ref 6.5–8.1)

## 2024-01-28 LAB — RESP PANEL BY RT-PCR (RSV, FLU A&B, COVID)  RVPGX2
Influenza A by PCR: NEGATIVE
Influenza B by PCR: NEGATIVE
Resp Syncytial Virus by PCR: NEGATIVE
SARS Coronavirus 2 by RT PCR: NEGATIVE

## 2024-01-28 LAB — LACTIC ACID, PLASMA
Lactic Acid, Venous: 8.9 mmol/L (ref 0.5–1.9)
Lactic Acid, Venous: >9 mmol/L (ref 0.5–1.9)

## 2024-01-28 LAB — C DIFFICILE QUICK SCREEN W PCR REFLEX
C Diff antigen: NEGATIVE
C Diff interpretation: NOT DETECTED
C Diff toxin: NEGATIVE

## 2024-01-28 LAB — MAGNESIUM: Magnesium: 2.8 mg/dL — ABNORMAL HIGH (ref 1.7–2.4)

## 2024-01-28 LAB — GLUCOSE, CAPILLARY: Glucose-Capillary: 169 mg/dL — ABNORMAL HIGH (ref 70–99)

## 2024-01-28 LAB — TROPONIN T, HIGH SENSITIVITY
Troponin T High Sensitivity: 95 ng/L — ABNORMAL HIGH (ref 0–19)
Troponin T High Sensitivity: 92 ng/L — ABNORMAL HIGH (ref 0–19)

## 2024-01-28 LAB — PRO BRAIN NATRIURETIC PEPTIDE: Pro Brain Natriuretic Peptide: 11858 pg/mL — ABNORMAL HIGH (ref <300.0)

## 2024-01-28 LAB — PREPARE RBC (CROSSMATCH)

## 2024-01-28 MED ORDER — SODIUM BICARBONATE 8.4 % IV SOLN
50.0000 meq | Freq: Once | INTRAVENOUS | Status: DC
  Filled 2024-01-28: qty 50

## 2024-01-28 MED ORDER — IPRATROPIUM-ALBUTEROL 0.5-2.5 (3) MG/3ML IN SOLN
3.0000 mL | Freq: Once | RESPIRATORY_TRACT | Status: AC
  Administered 2024-01-28: 3 mL via RESPIRATORY_TRACT
  Filled 2024-01-28: qty 3

## 2024-01-28 MED ORDER — DEXAMETHASONE SOD PHOSPHATE PF 10 MG/ML IJ SOLN
10.0000 mg | Freq: Once | INTRAMUSCULAR | Status: AC
  Administered 2024-01-28: 10 mg via INTRAVENOUS

## 2024-01-28 MED ORDER — SODIUM ZIRCONIUM CYCLOSILICATE 10 G PO PACK: 10.0000 g | PACK | Freq: Once | ORAL | Status: DC

## 2024-01-28 MED ORDER — SODIUM CHLORIDE 0.9 % IV SOLN
140.0000 mg/m2 | Freq: Once | INTRAVENOUS | Status: AC
  Administered 2024-01-28: 252 mg via INTRAVENOUS
  Filled 2024-01-28: qty 42

## 2024-01-28 MED ORDER — ORAL CARE MOUTH RINSE: 15.0000 mL | OROMUCOSAL | Status: DC | PRN

## 2024-01-28 MED ORDER — DEXTROSE 50 % IV SOLN
1.0000 | Freq: Once | INTRAVENOUS | Status: AC
  Administered 2024-01-28: 50 mL via INTRAVENOUS
  Filled 2024-01-28: qty 50

## 2024-01-28 MED ORDER — SODIUM BICARBONATE 8.4 % IV SOLN
INTRAVENOUS | Status: AC
  Filled 2024-01-28: qty 1000

## 2024-01-28 MED ORDER — SODIUM CHLORIDE 0.9 % IV SOLN: INTRAVENOUS | Status: DC

## 2024-01-28 MED ORDER — NOREPINEPHRINE 4 MG/250ML-% IV SOLN
0.0000 ug/min | INTRAVENOUS | Status: DC
  Administered 2024-01-28: 5 ug/min via INTRAVENOUS
  Administered 2024-01-29: 2 ug/min via INTRAVENOUS
  Filled 2024-01-28: qty 250

## 2024-01-28 MED ORDER — SODIUM CHLORIDE 0.9% IV SOLUTION: Freq: Once | INTRAVENOUS | Status: DC

## 2024-01-28 MED ORDER — METRONIDAZOLE 500 MG/100ML IV SOLN
500.0000 mg | Freq: Once | INTRAVENOUS | Status: AC
  Administered 2024-01-28: 500 mg via INTRAVENOUS
  Filled 2024-01-28: qty 100

## 2024-01-28 MED ORDER — SODIUM CHLORIDE 0.9 % IV SOLN
150.0000 mg | Freq: Once | INTRAVENOUS | Status: AC
  Administered 2024-01-28: 150 mg via INTRAVENOUS
  Filled 2024-01-28: qty 150

## 2024-01-28 MED ORDER — CHLORHEXIDINE GLUCONATE CLOTH 2 % EX PADS
6.0000 | MEDICATED_PAD | Freq: Every day | CUTANEOUS | Status: DC
  Administered 2024-01-29 – 2024-01-30 (×2): 6 via TOPICAL

## 2024-01-28 MED ORDER — SODIUM CHLORIDE 0.9 % IV SOLN
287.6000 mg | Freq: Once | INTRAVENOUS | Status: AC
  Administered 2024-01-28: 290 mg via INTRAVENOUS
  Filled 2024-01-28: qty 29

## 2024-01-28 MED ORDER — SODIUM CHLORIDE 0.9 % IV SOLN
2.0000 g | Freq: Once | INTRAVENOUS | Status: AC
  Administered 2024-01-28: 2 g via INTRAVENOUS
  Filled 2024-01-28: qty 12.5

## 2024-01-28 MED ORDER — VANCOMYCIN HCL IN DEXTROSE 1-5 GM/200ML-% IV SOLN
1000.0000 mg | Freq: Once | INTRAVENOUS | Status: AC
  Administered 2024-01-28: 1000 mg via INTRAVENOUS
  Filled 2024-01-28: qty 200

## 2024-01-28 MED ORDER — ALBUTEROL SULFATE (2.5 MG/3ML) 0.083% IN NEBU
2.5000 mg | INHALATION_SOLUTION | Freq: Once | RESPIRATORY_TRACT | Status: AC
  Administered 2024-01-28: 2.5 mg via RESPIRATORY_TRACT
  Filled 2024-01-28: qty 3

## 2024-01-28 MED ORDER — INSULIN ASPART 100 UNIT/ML IV SOLN
5.0000 [IU] | Freq: Once | INTRAVENOUS | Status: AC
  Administered 2024-01-28: 5 [IU] via INTRAVENOUS
  Filled 2024-01-28: qty 1

## 2024-01-28 MED ORDER — FAMOTIDINE IN NACL 20-0.9 MG/50ML-% IV SOLN
20.0000 mg | Freq: Once | INTRAVENOUS | Status: AC
  Administered 2024-01-28: 20 mg via INTRAVENOUS
  Filled 2024-01-28: qty 50

## 2024-01-28 MED ORDER — PALONOSETRON HCL INJECTION 0.25 MG/5ML
0.2500 mg | Freq: Once | INTRAVENOUS | Status: AC
  Administered 2024-01-28: 0.25 mg via INTRAVENOUS
  Filled 2024-01-28: qty 5

## 2024-01-28 MED ORDER — CALCIUM GLUCONATE-NACL 1-0.675 GM/50ML-% IV SOLN
1.0000 g | Freq: Once | INTRAVENOUS | Status: AC
  Administered 2024-01-28: 1000 mg via INTRAVENOUS
  Filled 2024-01-28: qty 50

## 2024-01-28 MED ORDER — DIPHENHYDRAMINE HCL 50 MG/ML IJ SOLN
25.0000 mg | Freq: Once | INTRAMUSCULAR | Status: AC
  Administered 2024-01-28: 25 mg via INTRAVENOUS
  Filled 2024-01-28: qty 1

## 2024-01-28 NOTE — Progress Notes (Signed)
 1045 patient HR is 125 manually, history of Afib, CHF and has a defibrillator. MD made aware and ok to proceed with treatment. Will give one liter of NS over 2 hours per MD.     Upon cleaning pt up today from incontinence, a wound was noted, measuring 3 3/4 cm x 3  3/4 cm. No bone noted. MD made aware. MD will scan into chart.    Near completion of treatment today, no urinary out put noted, patient has been sleeping all day, has been incontinent of stool three times.MD made aware, son does not feel comfortable taking father home in this situation. MD to pt room to speak with one of the sons about his fathers situation. Per MD will send pt to the emergency room for further evaluation.   Report called to Andriette Oak RN Charge Nurse. Transported patient via stretcher and oxygen . Vitals stable and family is waiting in the ER for pt to arrive.

## 2024-01-28 NOTE — ED Triage Notes (Signed)
 PT came from cancer center. Pt has metastatic lung cancer and started chemo today. Pt has had several episodes of diarrhea today. Per cancer center pt has received approx 1500cc fluid and has not urinated at all. Pt son states pt has not peed all day. Pt also has sacral wound. Son states denies any pain at home.

## 2024-01-28 NOTE — Progress Notes (Signed)
 Oncology Progress Note  Patient at arrival today had heart rate of 125.  EKG obtained showed A-fib with RVR with no ST-T wave changes.  Administered 1 L bolus normal saline.    Labs reviewed from 01/21/2024: CMP: WNL, normal creatinine normal LFTs.  CBC: WBC: 17.8, hemoglobin: 10.3, TSAT: 8.  Administered chemotherapy.  Tolerated chemotherapy well with no complications.  Reported by the infusion nurse that patient is having several bowel movements.  Evaluated the patient at bedside around 3 PM.  Patient was drowsy but arousable.  Patient son at bedside.  By this time patient had multiple loose bowel stools during infusion.  Patient also has significantly functionally declined at home per son.  Patient also did not urinate all the time here in infusion clinic even after fluid boluses.  Examination: Patient drowsy but arousable.  Did not report any pain at this time.  Considering all the above symptoms, will send to ER for further evaluation.  Mickiel Dry, MD Hematology/Oncology [facility]

## 2024-01-28 NOTE — H&P (Incomplete)
 NAME:  Peter Lambert, MRN:  [id number], DOB:  [birth year], LOS: 0 ADMISSION DATE:  01/28/2024 CONSULTATION DATE:  01/28/2024 REFERRING MD:  Melvenia - EDP ([facility]), CHIEF COMPLAINT:  Hypotension   History of Present Illness:  78 year old man who presented to [facility] 12/2 as a transfer from [facility] for weakness and hypotension. PMHx significant for HTN, HLD, CAD with NSTEMI (PCI with DES to proximal LAD 2010), VF cardiac arrest s/p ICD implantation (2021 s/p occluded RCA), Afib (on Eliquis ), HFpEF (Echo 12/2023 with EF 60-65%), PAD (s/p aortofemoral bypass grafting), COPD, metastatic non-small cell lung CA (RUL SCC with mets to bone/spine, diagnosed 2025, on chemo/XRT), tobacco abuse, T2DM, hypothyroidism, UGIB, GERD, gout. Recent admission 11/17-11/23 for weakness, general functional decline. Recent presentation to ED 11/29 for R lower leg pain, thought to be gout-related pain and discharged home.  Patient received first chemotherapy infusion at [facility] 12/2AM; during treatment he was noted to be tachycardic, weak and incontinent of stool with minimal UOP despite fluid 1.5L fluid bolus. Sacral wound was also noted. Patient was transferred to [facility] ED for further evaluation. On ED arrival, patient was afebrile with HR 110, BP 109/73, RR 20, SpO2 96% on 2LNC. Labs were notable for WBC 5.0, Hgb 7.4 (baseline 8.5-10), Plt 122. Na 137, K 6.0, CO2 12, BUN 69/Cr 1.95 (baseline 1.1-1.2). AST/ALT 1630/827, Alk Phos 161, Tbili 2.0. LA 8.9 > 9.0. Trop 95 > 92 (suspect demand-related), BNP 11,858. UA grossly unremarkable. C. Diff negative, COVID/Flu/RSV negative. BCx pending. CXR negative for acute findings. CT Chest/A/P demonstrated metastatic lung CA with progression of metastatic disease, increased size of primary lesion and increased size of lytic bone lesions; new moderate R pleural effusion.   While in ED, patient became hypotensive to SBP 80s-90s requiring Levophed  initiation. 1U PRBCs was ordered for symptomatic  anemia. Decision to transfer patient to [facility] for admission. PCCM consulted for ICU admission.  Pertinent Medical History:   Past Medical History:  Diagnosis Date  . Acute upper GI bleed   . Anemia   . Anticoagulated on apixaban    . Cardiac arrest with ventricular fibrillation (HCC) 2021  . CHF (congestive heart failure) (HCC)   . Chronic a-fib (HCC)   . Coronary artery disease    a. s/p NSTEMI in 2010 with DES to proximal LAD with D1 jailed and angioplasty alone to ostium  . Dyspnea   . GERD (gastroesophageal reflux disease)   . Hyperlipidemia   . Hypertension   . Mediastinal lymphadenopathy   . NSTEMI (non-ST elevated myocardial infarction) (HCC) 2010  . Peripheral arterial disease   . S/P coronary artery stent placement   . S/P ICD (internal cardiac defibrillator) procedure   . Squamous cell carcinoma of lung, stage IV, right (HCC)    Diagnosed 2025, metastatic to bone/spine  . Tobacco abuse   . Type 2 diabetes mellitus (HCC)    Significant Hospital Events: Including procedures, antibiotic start and stop dates in addition to other pertinent events   12/2 - Presented to [facility] ED after weakness, decreased UOP, multiple episodes of diarrhea while receiving first chemotherapy infusion. Labs with anemia, AKI, ALI. CT Chest/A/P with concern for worsening primary lesion/progression of metastatic disease. Hypotensive in ED requiring Levo initiation/1U PRBCs. Transferred to [facility] for admission. PCCM consulted.  Interim History / Subjective:  PCCM consulted for ICU admission at [facility]. S/p first chemotherapy session today, 12/2 Labs with anemia, AKI, ALI CT unfortunately with disease progression On Levo, getting 1U PRBCs  Objective:  Blood  pressure 102/82, pulse (!) 101, temperature (!) 97.4 F (36.3 C), resp. rate 14, SpO2 96%.        Intake/Output Summary (Last 24 hours) at 01/28/2024 2239 Last data filed at 01/28/2024 2012 Gross per 24 hour  Intake 400 ml  Output --  Net 400 ml    There were no vitals filed for this visit.  Physical Examination: General: {SRACUITY:25313} ill-appearing *** in NAD. HEENT: [place]/AT, anicteric sclera, PERRL, moist mucous membranes. Neuro: {SRLOC:25308} {SRSTIMULI:25309} {SRCOMMANDS:25310} {SRNEUROEXTREMITIES:25312} Strength ***/5 in *** extremities. {SRRBRAINSTEM:25311}  CV: RRR, no m/g/r. PULM: Breathing even and unlabored on ***. Lung fields ***. GI: Soft, nontender, nondistended. Normoactive bowel sounds. Extremities: *** LE edema noted. Skin: Warm/dry, ***.  Resolved Hospital Problem List:    Assessment & Plan:  Undifferentiated shock, suspect primarily hypovolemic - Admit to ICU at [facility] - Goal MAP > 65 - Fluid/volume resuscitation as tolerated; receiving 1U PRBCs - Levophed  titrated to goal MAP - Trend H&H - Monitor for signs of active bleeding - Transfuse for Hgb < 7.0 or hemodynamically significant bleeding  AKI, suspect prerenal versus ATN in the setting of chemo Hyperkalemia AGMA Lactic acidosis - Trend BMP, LA - Replete electrolytes as indicated; hold home K supplementation - Bicarb gtt started - Monitor I&Os - Avoid nephrotoxic agents as able - Ensure adequate renal perfusion  Acute liver injury Query hypoperfusion versus chemotherapy-related. - Trend LFTs to normal - CT without focal abnormalities, consider RUQ US  if not improving  Metastatic non-small cell lung CA; RUL SCC with mets to bone/spine, diagnosed 2025, on chemo/XRT Cancer-related pain COPD Tobacco abuse Moderate R pleural effusion, new S/p initial chemotherapy session 12/2. - Oncology consult - Bronchodilators PRN - Continue Decadron  (prescribed post-chemo x 3 days) - Multimodal pain control for bony metastasis (oxycodone , tramadol , Fentanyl  patch?) - Bedside POCUS of R pleural effusion on arrival to [facility], may need tap, Eliquis  on board presently - Follow CXR  CAD with NSTEMI (PCI with DES to proximal LAD 2010) VF cardiac arrest s/p ICD  implantation (2021 s/p occluded RCA) HFpEF (Echo 12/2023 with EF 60-65%) Afib (on Eliquis ) HTN HLD PAD (s/p aortofemoral bypass grafting) CHA2DS2-VASc Score 6 (age, CHF, HTN, prior MI, DM). HAS-BLED Score 2 (HTN, prior UGIB). - Cardiac monitoring - Optimize electrolytes for K > 4, Mg > 2 - Eliquis  for AC, hold for now - Hold Lasix  - Repeat Echo  T2DM - SSI - CBGs Q4H - Goal CBG 140-180  Hypothyroidism - Continue home levothyroxine   GERD - Continue PPI  Gout - Hold colchicine  for now  Sacral wound, POA - WOC consult  Labs:  CBC: Recent Labs  Lab 01/28/24 1705 01/28/24 1923  WBC ACCURACY OF RESULTS QUESTIONABLE. RECOMMEND RECOLLECT TO VERIFY. 5.0  NEUTROABS ACCURACY OF RESULTS QUESTIONABLE. RECOMMEND RECOLLECT TO VERIFY. 4.4  HGB ACCURACY OF RESULTS QUESTIONABLE. RECOMMEND RECOLLECT TO VERIFY. 7.4*  HCT ACCURACY OF RESULTS QUESTIONABLE. RECOMMEND RECOLLECT TO VERIFY. 23.9*  MCV ACCURACY OF RESULTS QUESTIONABLE. RECOMMEND RECOLLECT TO VERIFY. 83.6  PLT ACCURACY OF RESULTS QUESTIONABLE. RECOMMEND RECOLLECT TO VERIFY. 122*   Basic Metabolic Panel: Recent Labs  Lab 01/28/24 1705  NA 137  K 6.0*  CL 101  CO2 12*  GLUCOSE 93  BUN 69*  CREATININE 1.95*  CALCIUM  7.9*  MG 2.8*   GFR: Estimated Creatinine Clearance: 28.1 mL/min (A) (by C-G formula based on SCr of 1.95 mg/dL (H)). Recent Labs  Lab 01/28/24 1705 01/28/24 1841 01/28/24 1923  WBC ACCURACY OF RESULTS QUESTIONABLE. RECOMMEND RECOLLECT TO VERIFY.  --  5.0  LATICACIDVEN 8.9* >9.0*  --    Liver Function Tests: Recent Labs  Lab 01/28/24 1705  AST 1,630*  ALT 827*  ALKPHOS 161*  BILITOT 2.0*  PROT 6.1*  ALBUMIN 3.0*   No results for input(s): LIPASE, AMYLASE in the last 168 hours. No results for input(s): AMMONIA in the last 168 hours.  ABG:    Component Value Date/Time   PHART 7.407 09/03/2019 0843   PCO2ART 45.8 09/03/2019 0843   PO2ART 134 (H) 09/03/2019 0843   HCO3 19.3 (L)  01/28/2024 2221   TCO2 28 09/03/2019 0848   TCO2 31 09/03/2019 0848   ACIDBASEDEF 7.4 (H) 01/28/2024 2221   O2SAT 52.6 01/28/2024 2221    Coagulation Profile: No results for input(s): INR, PROTIME in the last 168 hours.  Cardiac Enzymes: No results for input(s): CKTOTAL, CKMB, CKMBINDEX, TROPONINI in the last 168 hours.  HbA1C: Hgb A1c MFr Bld  Date/Time Value Ref Range Status  01/14/2024 02:32 AM 6.8 (H) 4.8 - 5.6 % Final    Comment:    (NOTE) Diagnosis of Diabetes The following HbA1c ranges recommended by the American Diabetes Association (ADA) may be used as an aid in the diagnosis of diabetes mellitus.  Hemoglobin             Suggested A1C NGSP%              Diagnosis  <5.7                   Non Diabetic  5.7-6.4                Pre-Diabetic  >6.4                   Diabetic  <7.0                   Glycemic control for                       adults with diabetes.    04/30/2020 01:08 AM 6.1 (H) 4.8 - 5.6 % Final    Comment:    (NOTE) Pre diabetes:          5.7%-6.4%  Diabetes:              >6.4%  Glycemic control for   <7.0% adults with diabetes    CBG: No results for input(s): GLUCAP in the last 168 hours.  Review of Systems:   ***  Past Medical History:  He,  has a past medical history of Acute upper GI bleed, Anemia, Anticoagulated on apixaban , Cardiac arrest with ventricular fibrillation (HCC) (2021), CHF (congestive heart failure) (HCC), Chronic a-fib (HCC), Coronary artery disease, Dyspnea, GERD (gastroesophageal reflux disease), Hyperlipidemia, Hypertension, Mediastinal lymphadenopathy, NSTEMI (non-ST elevated myocardial infarction) (HCC) (2010), Peripheral arterial disease, S/P coronary artery stent placement, S/P ICD (internal cardiac defibrillator) procedure, Squamous cell carcinoma of lung, stage IV, right (HCC), Tobacco abuse, and Type 2 diabetes mellitus (HCC).   Surgical History:   Past Surgical History:  Procedure Laterality Date   . CARDIAC SURGERY    . COLONOSCOPY WITH PROPOFOL  N/A 01/08/2020   Procedure: COLONOSCOPY WITH PROPOFOL ;  Surgeon: Cindie Carlin POUR, DO;  Location: [facility];  Service: Endoscopy;  Laterality: N/A;  . COLONOSCOPY WITH PROPOFOL  N/A 05/03/2020   Hung:entire examined colon normal  . CORONARY STENT INTERVENTION N/A 09/03/2019   Procedure: CORONARY STENT INTERVENTION;  Surgeon: Verlin Lonni BIRCH, MD;  Location: [facility]  INVASIVE CV LAB;  Service: Cardiovascular;  Laterality: N/A;  . CORONARY STENT PLACEMENT    . ENDOBRONCHIAL ULTRASOUND Bilateral 01/07/2024   Procedure: ENDOBRONCHIAL ULTRASOUND (EBUS);  Surgeon: Malka Domino, MD;  Location: [facility] ORS;  Service: Pulmonary;  Laterality: Bilateral;  . ENTEROSCOPY N/A 05/03/2020   hung:normal esophagus, normal stomach. Three non-bleeding angiodysplastic lesions in the duodenum. Treated with a monopolar probe. A few non-bleeding angiodysplastic lesions in the jejunum. Treated with a monopolar probe. No specimens collected.  . ESOPHAGOGASTRODUODENOSCOPY (EGD) WITH PROPOFOL  N/A 01/06/2020   Procedure: ESOPHAGOGASTRODUODENOSCOPY (EGD) WITH PROPOFOL ;  Surgeon: Golda Claudis PENNER, MD;  Location: [facility];  Service: Endoscopy;  Laterality: N/A;  . HOT HEMOSTASIS N/A 05/03/2020   Procedure: HOT HEMOSTASIS (ARGON PLASMA COAGULATION/BICAP);  Surgeon: Rollin Dover, MD;  Location: [facility] ENDOSCOPY;  Service: Gastroenterology;  Laterality: N/A;  . ICD IMPLANT N/A 09/04/2019   Procedure: ICD IMPLANT;  Surgeon: Waddell Danelle ORN, MD;  Location: [facility] INVASIVE CV LAB;  Service: Cardiovascular;  Laterality: N/A;  . IR IMAGING GUIDED PORT INSERTION  01/17/2024  . RIGHT/LEFT HEART CATH AND CORONARY ANGIOGRAPHY N/A 09/03/2019   Procedure: RIGHT/LEFT HEART CATH AND CORONARY ANGIOGRAPHY;  Surgeon: Cherrie Toribio SAUNDERS, MD;  Location: [facility] INVASIVE CV LAB;  Service: Cardiovascular;  Laterality: N/A;   Social History:   reports that he has quit smoking. His smoking use  included cigarettes. He has a 26 pack-year smoking history. He has never used smokeless tobacco. He reports that he does not drink alcohol and does not use drugs.   Family History:  His family history includes Cancer in his mother; Colon cancer in his brother and mother; Dementia in his sister; Prostate cancer in his father. There is no history of Stomach cancer.   Allergies: No Known Allergies   Home Medications: Prior to Admission medications   Medication Sig Start Date End Date Taking? Authorizing Provider  acetaminophen  (TYLENOL ) 500 MG tablet Take 2 tablets (1,000 mg total) by mouth 3 (three) times daily. Patient taking differently: Take 1,000 mg by mouth every 8 (eight) hours as needed for mild pain (pain score 1-3). 01/19/24  Yes Amin, Ankit C, MD  albuterol  (VENTOLIN  HFA) 108 (90 Base) MCG/ACT inhaler Inhale 2 puffs into the lungs every 6 (six) hours as needed for wheezing or shortness of breath. 01/19/24  Yes Amin, Ankit C, MD  apixaban  (ELIQUIS ) 5 MG TABS tablet Take 1 tablet (5 mg total) by mouth 2 (two) times daily. Hold Eliquis  on 01/05/2024 for bronchoscopy 01/03/24  Yes Odell Celinda Balo, MD  colchicine  0.6 MG tablet Take 1 tablet (0.6 mg total) by mouth 2 (two) times daily. 01/25/24  Yes Cleotilde Rogue, MD  [employer] GENTLE LAXATIVE 5 MG EC tablet TAKE 1 TABLET BY MOUTH DAILY AS NEEDED FOR MODERATE CONSTIPATION. TAKE TWO DAYS BEFORE PROCEDURE Patient taking differently: Take 5 mg by mouth daily as needed for mild constipation or moderate constipation. 05/30/20  Yes Eartha Angelia Toribio, MD  dexamethasone  (DECADRON ) 4 MG tablet Take 2 tablets (8mg ) by mouth daily starting the day after carboplatin for 3 days. Take with food 01/27/24  Yes Davonna Siad, MD  feeding supplement (ENSURE PLUS HIGH PROTEIN) LIQD Take 237 mLs by mouth 3 (three) times daily between meals. 01/19/24  Yes Amin, Ankit C, MD  furosemide  (LASIX ) 20 MG tablet Take 20 mg by mouth daily.   Yes [provider]  levothyroxine  (SYNTHROID ) 50 MCG tablet Take 50 mcg by mouth daily before breakfast.   Yes [provider]  lidocaine -prilocaine (EMLA) cream Apply 1 Application topically as needed (Apply 30 minutes to 1 hour prior to port access.). 01/27/24  Yes Kandala, Hyndavi, MD  Multiple Vitamin (MULTIVITAMIN WITH MINERALS) TABS tablet Take 1 tablet by mouth daily. 01/19/24  Yes Amin, Ankit C, MD  nitroGLYCERIN  (NITROSTAT ) 0.4 MG SL tablet Place 1 tablet (0.4 mg total) under the tongue every 5 (five) minutes as needed for chest pain. Patient must keep upcoming appointment for further refills 07/20/21  Yes Court Dorn PARAS, MD  ondansetron  (ZOFRAN ) 8 MG tablet Take 1 tablet (8 mg total) by mouth every 8 (eight) hours as needed for nausea or vomiting. Start on the third day after carboplatin. 01/27/24  Yes Davonna Siad, MD  oxyCODONE  (OXY IR/ROXICODONE ) 5 MG immediate release tablet Take 1-2 tablets (5-10 mg total) by mouth every 4 (four) hours as needed for moderate pain (pain score 4-6), severe pain (pain score 7-10) or breakthrough pain. 01/19/24  Yes Amin, Ankit C, MD  polyethylene glycol powder (MIRALAX ) 17 GM/SCOOP powder Take 17 g by mouth daily as needed for moderate constipation or mild constipation. Dissolve 1 capful (17g) in 4-8 ounces of liquid and take by mouth daily. 01/19/24  Yes Amin, Ankit C, MD  potassium chloride  SA (KLOR-CON ) 20 MEQ tablet Take 20 mEq by mouth daily after lunch.   Yes [provider]  prochlorperazine  (COMPAZINE ) 10 MG tablet Take 1 tablet (10 mg total) by mouth every 6 (six) hours as needed for nausea or vomiting. 01/27/24  Yes Davonna Siad, MD  senna-docusate (SENOKOT-S) 8.6-50 MG tablet Take 1 tablet by mouth at bedtime. 01/19/24  Yes Amin, Ankit C, MD  omeprazole  (PRILOSEC) 20 MG capsule Take 20 mg by mouth daily after lunch.    [provider]  potassium chloride  (KLOR-CON ) 20 MEQ packet TAKE 1 TABLET BY MOUTH EVERY DAY 12/24/12 07/14/13   Court Dorn PARAS, MD    Critical care time:   The patient is critically ill with multiple organ system failure and requires high complexity decision making for assessment and support, frequent evaluation and titration of therapies, advanced monitoring, review of radiographic studies and interpretation of complex data.   Critical Care Time devoted to patient care services, exclusive of separately billable procedures, described in this note is *** minutes.  Corean CHRISTELLA Estaban [place], PA-C [facility] 01/28/24 10:39 PM  Please see [email or web address] for pager details.  From 7A-7P if no response, please call [phone] After hours, please call ELink [phone]

## 2024-01-28 NOTE — Radiation Completion Notes (Signed)
 Patient Name: Peter Lambert, Peter Lambert MRN: [id number] Date of Birth: 04/14/1945 Referring Physician: NILDA FENDT, M.D. Date of Service: 2024-01-28 Radiation Oncologist: Lauraine Golden, M.D. [facility]                             RADIATION ONCOLOGY END OF TREATMENT NOTE     Diagnosis: C79.51 Secondary malignant neoplasm of bone Staging on 2024-01-16: 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) T=cTX, N=cNX, M=cM1c1 Intent: Palliative     ==========DELIVERED PLANS==========  First Treatment Date: 2024-01-17 Last Treatment Date: 2024-01-22   Plan Name: Pelvis_R Site: Pelvis Technique: 3D Mode: Photon Dose Per Fraction: 4 Gy Prescribed Dose (Delivered / Prescribed): 20 Gy / 20 Gy Prescribed Fxs (Delivered / Prescribed): 5 / 5   Plan Name: Spine_T Site: Thoracic Spine Technique: 3D Mode: Photon Dose Per Fraction: 4 Gy Prescribed Dose (Delivered / Prescribed): 20 Gy / 20 Gy Prescribed Fxs (Delivered / Prescribed): 5 / 5   Plan Name: Pelvis_L Site: Pelvis Technique: 3D Mode: Photon Dose Per Fraction: 4 Gy Prescribed Dose (Delivered / Prescribed): 20 Gy / 20 Gy Prescribed Fxs (Delivered / Prescribed): 5 / 5     ==========ON TREATMENT VISIT DATES========== 2024-01-17, 2024-01-21     ==========UPCOMING VISITS========== 03/12/2024 [facility] INJECTION AP-ACAPA NURSE  03/10/2024 [facility] INFUSION 5HR15MIN (315) AP-ACAPA CHAIR 1  03/10/2024 [facility] OFFICE VISIT 20 Davonna Siad, MD  03/10/2024 [facility]CER C[facility] PORT FLUSH W/LAB AP-ACAPA NURSE  03/05/2024 [facility] ONC FOLLOW UP 20 Golden Lauraine, MD  03/02/2024 [facility] AT [facility] HOME REMOTE DEFIB CK CVD HVT DEVICE REMOTES  02/25/2024 [facility] MR BRAIN W WO CONTRAST [facility] 2  02/18/2024 [facility] INJECTION AP-ACAPA NURSE  02/17/2024 [facility] CANCER CENTER OFFICE VISIT 20 Davonna Siad,  MD  02/17/2024 [facility] PORT FLUSH W/LAB AP-ACAPA NURSE  02/17/2024 [facility] INFUSION 5HR15MIN (315) AP-ACAPA CHAIR 1  02/10/2024 [facility] NUT 45 Ivonne Harlene RAMAN, [place]  02/03/2024 [facility] INFUSION 2HR (120) AP-ACAPA CHAIR 1  01/30/2024 [facility] INJECTION AP-ACAPA NURSE  01/29/2024 [facility] ONCOLOGY SOCIAL WORKER Elois Devere SAUNDERS, LCSW  01/28/2024 [facility]-CT IMAGING CT CHEST ABDOMEN PELVIS WO CM AP-CT 1  01/28/2024 [facility]-DIAGNOSTIC RAD DG PORT AP-DG PORT 1  01/28/2024 [facility] INFUSION 5HR45MIN (345) AP-ACAPA CHAIR 1        ==========APPENDIX - ON TREATMENT VISIT NOTES==========   See weekly On Treatment Notes in Epic for details in the Media tab (listed as Progress notes on the On Treatment Visit Dates listed above).

## 2024-01-28 NOTE — H&P (Incomplete)
 NAME:  Peter Lambert, MRN:  [id number], DOB:  March 05, 1945, LOS: 1 ADMISSION DATE:  01/28/2024 CONSULTATION DATE:  01/29/2024 REFERRING MD:  Melvenia - EDP ([facility]), CHIEF COMPLAINT:  Hypotension   History of Present Illness:  78 year old man who presented to [facility] 12/2 as a transfer from [facility] for weakness and hypotension. PMHx significant for HTN, HLD, CAD with NSTEMI (PCI with DES to proximal LAD 2010), VF cardiac arrest s/p ICD implantation (2021 s/p occluded RCA), Afib (on Eliquis ), HFpEF (Echo 12/2023 with EF 60-65%), PAD (s/p aortofemoral bypass grafting), COPD, metastatic non-small cell lung CA (RUL SCC with mets to bone/spine, diagnosed 2025, on chemo/XRT), tobacco abuse, T2DM, hypothyroidism, UGIB, GERD, gout. Recent admission 11/17-11/23 for weakness, general functional decline. Recent presentation to ED 11/29 for R lower leg pain, thought to be gout-related pain and discharged home.  Patient received first chemotherapy infusion at [facility] 12/2AM; during treatment he was noted to be tachycardic, weak and incontinent of stool with minimal UOP despite fluid 1.5L fluid bolus. Sacral wound was also noted. Patient was transferred to [facility] ED for further evaluation. On ED arrival, patient was afebrile with HR 110, BP 109/73, RR 20, SpO2 96% on 2LNC. Labs were notable for WBC 5.0, Hgb 7.4 (baseline 8.5-10), Plt 122. Na 137, K 6.0, CO2 12, BUN 69/Cr 1.95 (baseline 1.1-1.2). AST/ALT 1630/827, Alk Phos 161, Tbili 2.0. LA 8.9 > 9.0. Trop 95 > 92 (suspect demand-related), BNP 11,858. UA grossly unremarkable. C. Diff negative, COVID/Flu/RSV negative. BCx pending. CXR negative for acute findings. CT Chest/A/P demonstrated metastatic lung CA with progression of metastatic disease, increased size of primary lesion and increased size of lytic bone lesions; new moderate R pleural effusion.   While in ED, patient became hypotensive to SBP 80s-90s requiring Levophed  initiation. 1U PRBCs was ordered for symptomatic  anemia. Decision to transfer patient to [facility] for admission. PCCM consulted for ICU admission.  Pertinent Medical History:   Past Medical History:  Diagnosis Date   Acute upper GI bleed    Anemia    Anticoagulated on apixaban     Cardiac arrest with ventricular fibrillation (HCC) 2021   CHF (congestive heart failure) (HCC)    Chronic a-fib (HCC)    Coronary artery disease    a. s/p NSTEMI in 2010 with DES to proximal LAD with D1 jailed and angioplasty alone to ostium   Dyspnea    GERD (gastroesophageal reflux disease)    Hyperlipidemia    Hypertension    Mediastinal lymphadenopathy    NSTEMI (non-ST elevated myocardial infarction) (HCC) 2010   Peripheral arterial disease    S/P coronary artery stent placement    S/P ICD (internal cardiac defibrillator) procedure    Squamous cell carcinoma of lung, stage IV, right (HCC)    Diagnosed 2025, metastatic to bone/spine   Tobacco abuse    Type 2 diabetes mellitus (HCC)    Significant Hospital Events: Including procedures, antibiotic start and stop dates in addition to other pertinent events   12/2 - Presented to [facility] ED after weakness, decreased UOP, multiple episodes of diarrhea while receiving first chemotherapy infusion. Labs with anemia, AKI, ALI. CT Chest/A/P with concern for worsening primary lesion/progression of metastatic disease. Hypotensive in ED requiring Levo initiation/1U PRBCs. Transferred to [facility] for admission. PCCM consulted.  Interim History / Subjective:  PCCM consulted for ICU admission at [facility]. S/p first chemotherapy session today, 12/2 Labs with anemia, AKI, ALI CT unfortunately with disease progression On Levo, downtitrating dose, getting 1U PRBCs  Objective:  Blood pressure 97/68, pulse (!) 111, temperature (!) 97.4 F (36.3 C), resp. rate 17, height 5' 7.99 (1.727 m), weight 63.8 kg, SpO2 99%.        Intake/Output Summary (Last 24 hours) at 01/29/2024 0013 Last data filed at 01/28/2024 2240 Gross per 24 hour   Intake 406.96 ml  Output --  Net 406.96 ml   Filed Weights   01/28/24 2345  Weight: 63.8 kg   Physical Examination: General: Acute-on-chronically ill-appearing elderly man in NAD. HEENT: /AT, anicteric sclera, PERRL 3mm, dry mucous membranes. Neuro: Drowsy, but wakes easily to voice. Responds to verbal stimuli. Following commands consistently. Moves all 4 extremities spontaneously. Generalized weakness. CV: Irregularly irregular rhythm, rate 120s, no m/g/r. Port-A-Cath to R upper chest. PULM: Breathing even and mildly labored on 3LNC. Lung fields coarse R > L, diminished on R, especially at R base. GI: Soft, nontender, mildly distended. Normoactive bowel sounds. Extremities: Trace symmetric BLE edema noted. Skin: Warm/dry, no rashes. +Sacral decubitus ulcer noted, please see nursing documentation.  Resolved Hospital Problem List:    Assessment & Plan:  Undifferentiated shock, suspect primarily hypovolemic - Admit to ICU at [facility] - Goal MAP > 65 - Fluid/volume resuscitation as tolerated; receiving 1U PRBCs - Levophed  titrated to goal MAP - Trend H&H, f/u post-transfusion CBC/labs - Monitor for signs of active bleeding - Transfuse for Hgb < 7.0 or hemodynamically significant bleeding  AKI, suspect prerenal versus ATN in the setting of chemo Hyperkalemia Hypocalcemia AGMA Lactic acidosis - Trend BMP, LA - Replete electrolytes as indicated; hold home K supplementation - Bicarb gtt started - Monitor I&Os - Avoid nephrotoxic agents as able - Ensure adequate renal perfusion  Acute liver injury Query hypoperfusion versus chemotherapy-related. - Trend LFTs to normal - CT without focal abnormalities, consider RUQ US  if not improving  Metastatic non-small cell lung CA; RUL SCC with mets to bone/spine, diagnosed 2025, on chemo/XRT Cancer-related pain COPD Tobacco abuse Moderate R pleural effusion, new S/p initial chemotherapy session 12/2. - Oncology consult -  Bronchodilators PRN - Continue Decadron  (prescribed post-chemo x 3 days) - Multimodal pain control for bony metastasis (oxycodone , tramadol , Fentanyl  patch?) - Bedside POCUS of R pleural effusion on arrival to [facility], may need tap, Eliquis  on board presently - Follow CXR  CAD with NSTEMI (PCI with DES to proximal LAD 2010) VF cardiac arrest s/p ICD implantation (2021 s/p occluded RCA) HFpEF (Echo 12/2023 with EF 60-65%) Afib (on Eliquis ) HTN HLD PAD (s/p aortofemoral bypass grafting) CHA2DS2-VASc Score 6 (age, CHF, HTN, prior MI, DM). HAS-BLED Score 2 (HTN, prior UGIB). - Cardiac monitoring - Optimize electrolytes for K > 4, Mg > 2 - Eliquis  for AC, hold for now - Hold Lasix  - Repeat Echo  T2DM - SSI - CBGs Q4H - Goal CBG 140-180  Hypothyroidism - Continue home levothyroxine   GERD - Continue PPI  Gout - Hold colchicine  for now  Sacral wound, POA - WOC consult  Labs:  CBC: Recent Labs  Lab 01/28/24 1705 01/28/24 1923  WBC ACCURACY OF RESULTS QUESTIONABLE. RECOMMEND RECOLLECT TO VERIFY. 5.0  NEUTROABS ACCURACY OF RESULTS QUESTIONABLE. RECOMMEND RECOLLECT TO VERIFY. 4.4  HGB ACCURACY OF RESULTS QUESTIONABLE. RECOMMEND RECOLLECT TO VERIFY. 7.4*  HCT ACCURACY OF RESULTS QUESTIONABLE. RECOMMEND RECOLLECT TO VERIFY. 23.9*  MCV ACCURACY OF RESULTS QUESTIONABLE. RECOMMEND RECOLLECT TO VERIFY. 83.6  PLT ACCURACY OF RESULTS QUESTIONABLE. RECOMMEND RECOLLECT TO VERIFY. 122*   Basic Metabolic Panel: Recent Labs  Lab 01/28/24 1705  NA 137  K 6.0*  CL  101  CO2 12*  GLUCOSE 93  BUN 69*  CREATININE 1.95*  CALCIUM  7.9*  MG 2.8*   GFR: Estimated Creatinine Clearance: 27.3 mL/min (A) (by C-G formula based on SCr of 1.95 mg/dL (H)). Recent Labs  Lab 01/28/24 1705 01/28/24 1841 01/28/24 1923  WBC ACCURACY OF RESULTS QUESTIONABLE. RECOMMEND RECOLLECT TO VERIFY.  --  5.0  LATICACIDVEN 8.9* >9.0*  --    Liver Function Tests: Recent Labs  Lab 01/28/24 1705  AST 1,630*   ALT 827*  ALKPHOS 161*  BILITOT 2.0*  PROT 6.1*  ALBUMIN 3.0*   No results for input(s): LIPASE, AMYLASE in the last 168 hours. No results for input(s): AMMONIA in the last 168 hours.  ABG:    Component Value Date/Time   PHART 7.407 09/03/2019 0843   PCO2ART 45.8 09/03/2019 0843   PO2ART 134 (H) 09/03/2019 0843   HCO3 19.3 (L) 01/28/2024 2221   TCO2 28 09/03/2019 0848   TCO2 31 09/03/2019 0848   ACIDBASEDEF 7.4 (H) 01/28/2024 2221   O2SAT 52.6 01/28/2024 2221    Coagulation Profile: No results for input(s): INR, PROTIME in the last 168 hours.  Cardiac Enzymes: No results for input(s): CKTOTAL, CKMB, CKMBINDEX, TROPONINI in the last 168 hours.  HbA1C: Hgb A1c MFr Bld  Date/Time Value Ref Range Status  01/14/2024 02:32 AM 6.8 (H) 4.8 - 5.6 % Final    Comment:    (NOTE) Diagnosis of Diabetes The following HbA1c ranges recommended by the American Diabetes Association (ADA) may be used as an aid in the diagnosis of diabetes mellitus.  Hemoglobin             Suggested A1C NGSP%              Diagnosis  <5.7                   Non Diabetic  5.7-6.4                Pre-Diabetic  >6.4                   Diabetic  <7.0                   Glycemic control for                       adults with diabetes.    04/30/2020 01:08 AM 6.1 (H) 4.8 - 5.6 % Final    Comment:    (NOTE) Pre diabetes:          5.7%-6.4%  Diabetes:              >6.4%  Glycemic control for   <7.0% adults with diabetes    CBG: Recent Labs  Lab 01/28/24 2337  GLUCAP 169*    Review of Systems:   Review of systems completed with pertinent positives/negatives outlined in above HPI.  Past Medical History:  He,  has a past medical history of Acute upper GI bleed, Anemia, Anticoagulated on apixaban , Cardiac arrest with ventricular fibrillation (HCC) (2021), CHF (congestive heart failure) (HCC), Chronic a-fib (HCC), Coronary artery disease, Dyspnea, GERD (gastroesophageal reflux  disease), Hyperlipidemia, Hypertension, Mediastinal lymphadenopathy, NSTEMI (non-ST elevated myocardial infarction) (HCC) (2010), Peripheral arterial disease, S/P coronary artery stent placement, S/P ICD (internal cardiac defibrillator) procedure, Squamous cell carcinoma of lung, stage IV, right (HCC), Tobacco abuse, and Type 2 diabetes mellitus (HCC).   Surgical History:   Past Surgical History:  Procedure Laterality  Date   CARDIAC SURGERY     COLONOSCOPY WITH PROPOFOL  N/A 01/08/2020   Procedure: COLONOSCOPY WITH PROPOFOL ;  Surgeon: Cindie Carlin POUR, DO;  Location: [facility];  Service: Endoscopy;  Laterality: N/A;   COLONOSCOPY WITH PROPOFOL  N/A 05/03/2020   Hung:entire examined colon normal   CORONARY STENT INTERVENTION N/A 09/03/2019   Procedure: CORONARY STENT INTERVENTION;  Surgeon: Verlin Lonni BIRCH, MD;  Location: [facility] INVASIVE CV LAB;  Service: Cardiovascular;  Laterality: N/A;   CORONARY STENT PLACEMENT     ENDOBRONCHIAL ULTRASOUND Bilateral 01/07/2024   Procedure: ENDOBRONCHIAL ULTRASOUND (EBUS);  Surgeon: Malka Domino, MD;  Location: [facility] ORS;  Service: Pulmonary;  Laterality: Bilateral;   ENTEROSCOPY N/A 05/03/2020   hung:normal esophagus, normal stomach. Three non-bleeding angiodysplastic lesions in the duodenum. Treated with a monopolar probe. A few non-bleeding angiodysplastic lesions in the jejunum. Treated with a monopolar probe. No specimens collected.   ESOPHAGOGASTRODUODENOSCOPY (EGD) WITH PROPOFOL  N/A 01/06/2020   Procedure: ESOPHAGOGASTRODUODENOSCOPY (EGD) WITH PROPOFOL ;  Surgeon: Golda Claudis PENNER, MD;  Location: [facility];  Service: Endoscopy;  Laterality: N/A;   HOT HEMOSTASIS N/A 05/03/2020   Procedure: HOT HEMOSTASIS (ARGON PLASMA COAGULATION/BICAP);  Surgeon: Rollin Dover, MD;  Location: [facility] ENDOSCOPY;  Service: Gastroenterology;  Laterality: N/A;   ICD IMPLANT N/A 09/04/2019   Procedure: ICD IMPLANT;  Surgeon: Waddell Danelle ORN, MD;  Location: [facility]  INVASIVE CV LAB;  Service: Cardiovascular;  Laterality: N/A;   IR IMAGING GUIDED PORT INSERTION  01/17/2024   RIGHT/LEFT HEART CATH AND CORONARY ANGIOGRAPHY N/A 09/03/2019   Procedure: RIGHT/LEFT HEART CATH AND CORONARY ANGIOGRAPHY;  Surgeon: Cherrie Toribio SAUNDERS, MD;  Location: [facility] INVASIVE CV LAB;  Service: Cardiovascular;  Laterality: N/A;   Social History:   reports that he has quit smoking. His smoking use included cigarettes. He has a 26 pack-year smoking history. He has never used smokeless tobacco. He reports that he does not drink alcohol  and does not use drugs.   Family History:  His family history includes Cancer in his mother; Colon cancer in his brother and mother; Dementia in his sister; Prostate cancer in his father. There is no history of Stomach cancer.   Allergies: No Known Allergies   Home Medications: Prior to Admission medications   Medication Sig Start Date End Date Taking? Authorizing Provider  acetaminophen  (TYLENOL ) 500 MG tablet Take 2 tablets (1,000 mg total) by mouth 3 (three) times daily. Patient taking differently: Take 1,000 mg by mouth every 8 (eight) hours as needed for mild pain (pain score 1-3). 01/19/24  Yes Amin, Ankit C, MD  albuterol  (VENTOLIN  HFA) 108 (90 Base) MCG/ACT inhaler Inhale 2 puffs into the lungs every 6 (six) hours as needed for wheezing or shortness of breath. 01/19/24  Yes Amin, Ankit C, MD  apixaban  (ELIQUIS ) 5 MG TABS tablet Take 1 tablet (5 mg total) by mouth 2 (two) times daily. Hold Eliquis  on 01/05/2024 for bronchoscopy 01/03/24  Yes Odell Celinda Balo, MD  colchicine  0.6 MG tablet Take 1 tablet (0.6 mg total) by mouth 2 (two) times daily. 01/25/24  Yes Cleotilde Rogue, MD  [employer] GENTLE LAXATIVE 5 MG EC tablet TAKE 1 TABLET BY MOUTH DAILY AS NEEDED FOR MODERATE CONSTIPATION. TAKE TWO DAYS BEFORE PROCEDURE Patient taking differently: Take 5 mg by mouth daily as needed for mild constipation or moderate constipation. 05/30/20  Yes Eartha Angelia Toribio, MD  dexamethasone  (DECADRON ) 4 MG tablet Take 2 tablets (8mg ) by mouth daily starting the day after carboplatin  for 3 days. Take  with food 01/27/24  Yes Davonna Siad, MD  feeding supplement (ENSURE PLUS HIGH PROTEIN) LIQD Take 237 mLs by mouth 3 (three) times daily between meals. 01/19/24  Yes Amin, Ankit C, MD  furosemide  (LASIX ) 20 MG tablet Take 20 mg by mouth daily.   Yes [provider]  levothyroxine  (SYNTHROID ) 50 MCG tablet Take 50 mcg by mouth daily before breakfast.   Yes [provider]  lidocaine -prilocaine (EMLA) cream Apply 1 Application topically as needed (Apply 30 minutes to 1 hour prior to port access.). 01/27/24  Yes Kandala, Hyndavi, MD  Multiple Vitamin (MULTIVITAMIN WITH MINERALS) TABS tablet Take 1 tablet by mouth daily. 01/19/24  Yes Amin, Ankit C, MD  nitroGLYCERIN  (NITROSTAT ) 0.4 MG SL tablet Place 1 tablet (0.4 mg total) under the tongue every 5 (five) minutes as needed for chest pain. Patient must keep upcoming appointment for further refills 07/20/21  Yes Court Dorn PARAS, MD  ondansetron  (ZOFRAN ) 8 MG tablet Take 1 tablet (8 mg total) by mouth every 8 (eight) hours as needed for nausea or vomiting. Start on the third day after carboplatin. 01/27/24  Yes Davonna Siad, MD  oxyCODONE  (OXY IR/ROXICODONE ) 5 MG immediate release tablet Take 1-2 tablets (5-10 mg total) by mouth every 4 (four) hours as needed for moderate pain (pain score 4-6), severe pain (pain score 7-10) or breakthrough pain. 01/19/24  Yes Amin, Ankit C, MD  polyethylene glycol powder (MIRALAX ) 17 GM/SCOOP powder Take 17 g by mouth daily as needed for moderate constipation or mild constipation. Dissolve 1 capful (17g) in 4-8 ounces of liquid and take by mouth daily. 01/19/24  Yes Amin, Ankit C, MD  potassium chloride  SA (KLOR-CON ) 20 MEQ tablet Take 20 mEq by mouth daily after lunch.   Yes [provider]  prochlorperazine  (COMPAZINE ) 10 MG tablet Take 1 tablet (10  mg total) by mouth every 6 (six) hours as needed for nausea or vomiting. 01/27/24  Yes Davonna Siad, MD  senna-docusate (SENOKOT-S) 8.6-50 MG tablet Take 1 tablet by mouth at bedtime. 01/19/24  Yes Amin, Ankit C, MD  omeprazole  (PRILOSEC) 20 MG capsule Take 20 mg by mouth daily after lunch.    [provider]  potassium chloride  (KLOR-CON ) 20 MEQ packet TAKE 1 TABLET BY MOUTH EVERY DAY 12/24/12 07/14/13  Court Dorn PARAS, MD    Critical care time:   The patient is critically ill with multiple organ system failure and requires high complexity decision making for assessment and support, frequent evaluation and titration of therapies, advanced monitoring, review of radiographic studies and interpretation of complex data.   Critical Care Time devoted to patient care services, exclusive of separately billable procedures, described in this note is 39 minutes.  Corean CHRISTELLA Jesusa Stenerson, PA-C [facility] 01/29/24 12:13 AM  Please see [email or web address] for pager details.  From 7A-7P if no response, please call [phone] After hours, please call ELink [phone]

## 2024-01-28 NOTE — Progress Notes (Signed)
 Pharmacist Chemotherapy Monitoring - Initial Assessment    Anticipated start date: 01/28/24   The following has been reviewed per standard work regarding the patient's treatment regimen: [name] diagnosis, treatment plan and drug doses, and organ/hematologic function Lab orders and baseline tests specific to treatment regimen  The treatment plan start date, drug sequencing, and pre-medications Prior authorization status  Patient's documented medication list, including drug-drug interaction screen and prescriptions for anti-emetics and supportive care specific to the treatment regimen The drug concentrations, fluid compatibility, administration routes, and timing of the medications to be used [name] access for treatment and lifetime cumulative dose history, if applicable  [name] medication allergies and previous infusion related reactions, if applicable   Changes made to treatment plan:  N/A  Follow up needed:  N/A   Peter Lambert, [facility], 01/28/2024  8:47 AM

## 2024-01-28 NOTE — ED Provider Notes (Signed)
 [place] EMERGENCY DEPARTMENT AT [facility] Provider Note   CSN: [id number] Arrival date & time: 01/28/24  8371     Patient presents with: Diarrhea and Urinary Retention   Peter Lambert is a 78 y.o. male.   HPI Patient presents for concern of diarrhea and urine retention.  Medical history includes CAD, PAD, HTN, atrial fibrillation, CHF, COPD, lung cancer, DM, CKD, HLD, anemia.  He is prescribed Eliquis .  He was seen in the ED 3 days ago for right leg pain.  Family reports that this was primarily in the right knee.  At the time, he had tenderness primarily in the calf.  He was prescribed colchicine  for empiric treatment of possible gout, given history of the same.  His other home medications include oxycodone , tramadol , fentanyl  patches.  Because of his right knee pain, he has not been able to ambulate.  Despite this, he continues to live alone.  Family comes by frequently to check on him.  This morning, he had multiple episodes of diarrhea witnessed by his son.  He went to his first chemotherapy session today.  During chemotherapy, he had ongoing diarrhea.  Despite receiving 1500 cc of IVF, patient has not been able to urinate today.  Patient endorses generalized weakness and shortness of breath.  He denies any areas of pain other than his ongoing right leg pain.    Prior to Admission medications   Medication Sig Start Date End Date Taking? Authorizing Provider  acetaminophen  (TYLENOL ) 500 MG tablet Take 2 tablets (1,000 mg total) by mouth 3 (three) times daily. Patient taking differently: Take 1,000 mg by mouth every 8 (eight) hours as needed for mild pain (pain score 1-3). 01/19/24  Yes Amin, Ankit C, MD  albuterol  (VENTOLIN  HFA) 108 (90 Base) MCG/ACT inhaler Inhale 2 puffs into the lungs every 6 (six) hours as needed for wheezing or shortness of breath. 01/19/24  Yes Amin, Ankit C, MD  apixaban  (ELIQUIS ) 5 MG TABS tablet Take 1 tablet (5 mg total) by mouth 2 (two) times  daily. Hold Eliquis  on 01/05/2024 for bronchoscopy 01/03/24  Yes Odell Celinda Balo, MD  colchicine  0.6 MG tablet Take 1 tablet (0.6 mg total) by mouth 2 (two) times daily. 01/25/24  Yes Cleotilde Rogue, MD  [employer] GENTLE LAXATIVE 5 MG EC tablet TAKE 1 TABLET BY MOUTH DAILY AS NEEDED FOR MODERATE CONSTIPATION. TAKE TWO DAYS BEFORE PROCEDURE Patient taking differently: Take 5 mg by mouth daily as needed for mild constipation or moderate constipation. 05/30/20  Yes Eartha Angelia Sieving, MD  dexamethasone  (DECADRON ) 4 MG tablet Take 2 tablets (8mg ) by mouth daily starting the day after carboplatin for 3 days. Take with food 01/27/24  Yes Davonna Siad, MD  feeding supplement (ENSURE PLUS HIGH PROTEIN) LIQD Take 237 mLs by mouth 3 (three) times daily between meals. 01/19/24  Yes Amin, Ankit C, MD  furosemide  (LASIX ) 20 MG tablet Take 20 mg by mouth daily.   Yes [provider]  levothyroxine  (SYNTHROID ) 50 MCG tablet Take 50 mcg by mouth daily before breakfast.   Yes [provider]  lidocaine -prilocaine (EMLA) cream Apply 1 Application topically as needed (Apply 30 minutes to 1 hour prior to port access.). 01/27/24  Yes Kandala, Hyndavi, MD  Multiple Vitamin (MULTIVITAMIN WITH MINERALS) TABS tablet Take 1 tablet by mouth daily. 01/19/24  Yes Amin, Ankit C, MD  nitroGLYCERIN  (NITROSTAT ) 0.4 MG SL tablet Place 1 tablet (0.4 mg total) under the tongue every 5 (five) minutes as needed  for chest pain. Patient must keep upcoming appointment for further refills 07/20/21  Yes Court Dorn PARAS, MD  ondansetron  (ZOFRAN ) 8 MG tablet Take 1 tablet (8 mg total) by mouth every 8 (eight) hours as needed for nausea or vomiting. Start on the third day after carboplatin. 01/27/24  Yes Davonna Siad, MD  oxyCODONE  (OXY IR/ROXICODONE ) 5 MG immediate release tablet Take 1-2 tablets (5-10 mg total) by mouth every 4 (four) hours as needed for moderate pain (pain score 4-6), severe pain (pain score 7-10) or  breakthrough pain. 01/19/24  Yes Amin, Ankit C, MD  polyethylene glycol powder (MIRALAX ) 17 GM/SCOOP powder Take 17 g by mouth daily as needed for moderate constipation or mild constipation. Dissolve 1 capful (17g) in 4-8 ounces of liquid and take by mouth daily. 01/19/24  Yes Amin, Ankit C, MD  potassium chloride  SA (KLOR-CON ) 20 MEQ tablet Take 20 mEq by mouth daily after lunch.   Yes [provider]  prochlorperazine  (COMPAZINE ) 10 MG tablet Take 1 tablet (10 mg total) by mouth every 6 (six) hours as needed for nausea or vomiting. 01/27/24  Yes Davonna Siad, MD  senna-docusate (SENOKOT-S) 8.6-50 MG tablet Take 1 tablet by mouth at bedtime. 01/19/24  Yes Amin, Ankit C, MD  omeprazole  (PRILOSEC) 20 MG capsule Take 20 mg by mouth daily after lunch.    [provider]  potassium chloride  (KLOR-CON ) 20 MEQ packet TAKE 1 TABLET BY MOUTH EVERY DAY 12/24/12 07/14/13  Court Dorn PARAS, MD    Allergies: Patient has no known allergies.    Review of Systems  Constitutional:  Positive for fatigue.  Gastrointestinal:  Positive for diarrhea.  Genitourinary:  Positive for difficulty urinating.  Musculoskeletal:  Positive for arthralgias.  Neurological:  Positive for weakness (Generalized).  All other systems reviewed and are negative.   Updated Vital Signs BP 104/62   Pulse (!) 111   Temp (!) 97.4 F (36.3 C)   Resp 17   SpO2 99%   Physical Exam Vitals and nursing note reviewed.  Constitutional:      General: He is not in acute distress.    Appearance: Normal appearance. He is well-developed. He is ill-appearing. He is not toxic-appearing or diaphoretic.  HENT:     Head: Normocephalic and atraumatic.     Right Ear: External ear normal.     Left Ear: External ear normal.     Nose: Nose normal.     Mouth/Throat:     Mouth: Mucous membranes are moist.  Eyes:     Conjunctiva/sclera: Conjunctivae normal.  Cardiovascular:     Rate and Rhythm: Normal rate and regular  rhythm.     Heart sounds: No murmur heard. Pulmonary:     Effort: Pulmonary effort is normal. No respiratory distress.     Breath sounds: Wheezing present. No rales.  Chest:     Chest wall: No tenderness.  Abdominal:     General: There is no distension.     Palpations: Abdomen is soft.     Tenderness: There is no abdominal tenderness.  Musculoskeletal:     Cervical back: Normal range of motion and neck supple.     Right lower leg: No edema.     Left lower leg: No edema.  Skin:    General: Skin is warm and dry.     Coloration: Skin is not jaundiced or pale.  Neurological:     General: No focal deficit present.     Mental Status: He is alert and  oriented to person, place, and time.  Psychiatric:        Mood and Affect: Mood normal.        Behavior: Behavior normal.     (all labs ordered are listed, but only abnormal results are displayed) Labs Reviewed  LACTIC ACID, PLASMA - Abnormal; Notable for the following components:      Result Value   Lactic Acid, Venous 8.9 (*)    All other components within normal limits  LACTIC ACID, PLASMA - Abnormal; Notable for the following components:   Lactic Acid, Venous >9.0 (*)    All other components within normal limits  COMPREHENSIVE METABOLIC PANEL WITH GFR - Abnormal; Notable for the following components:   Potassium 6.0 (*)    CO2 12 (*)    BUN 69 (*)    Creatinine, Ser 1.95 (*)    Calcium  7.9 (*)    Total Protein 6.1 (*)    Albumin 3.0 (*)    AST 1,630 (*)    ALT 827 (*)    Alkaline Phosphatase 161 (*)    Total Bilirubin 2.0 (*)    GFR, Estimated 34 (*)    Anion gap 24 (*)    All other components within normal limits  BLOOD GAS, VENOUS - Abnormal; Notable for the following components:   pH, Ven 7.18 (*)    pCO2, Ven 43 (*)    pO2, Ven <31 (*)    Bicarbonate 16.0 (*)    Acid-base deficit 11.9 (*)    All other components within normal limits  PRO BRAIN NATRIURETIC PEPTIDE - Abnormal; Notable for the following components:    Pro Brain Natriuretic Peptide 11,858.0 (*)    All other components within normal limits  URINALYSIS, W/ REFLEX TO CULTURE (INFECTION SUSPECTED) - Abnormal; Notable for the following components:   APPearance HAZY (*)    Protein, ur 30 (*)    Bacteria, UA RARE (*)    All other components within normal limits  MAGNESIUM  - Abnormal; Notable for the following components:   Magnesium  2.8 (*)    All other components within normal limits  CBC WITH DIFFERENTIAL/PLATELET - Abnormal; Notable for the following components:   RBC 2.86 (*)    Hemoglobin 7.4 (*)    HCT 23.9 (*)    MCH 25.9 (*)    RDW 17.0 (*)    Platelets 122 (*)    nRBC 1.2 (*)    Lymphs Abs 0.3 (*)    Abs Immature Granulocytes 0.40 (*)    All other components within normal limits  BLOOD GAS, VENOUS - Abnormal; Notable for the following components:   pCO2, Ven 43 (*)    Bicarbonate 19.3 (*)    Acid-base deficit 7.4 (*)    All other components within normal limits  TROPONIN T, HIGH SENSITIVITY - Abnormal; Notable for the following components:   Troponin T High Sensitivity 95 (*)    All other components within normal limits  TROPONIN T, HIGH SENSITIVITY - Abnormal; Notable for the following components:   Troponin T High Sensitivity 92 (*)    All other components within normal limits  RESP PANEL BY RT-PCR (RSV, FLU A&B, COVID)  RVPGX2  C DIFFICILE QUICK SCREEN W PCR REFLEX    CULTURE, BLOOD (ROUTINE X 2)  CULTURE, BLOOD (ROUTINE X 2)  CBC WITH DIFFERENTIAL/PLATELET  BLOOD GAS, ARTERIAL  TYPE AND SCREEN  PREPARE RBC (CROSSMATCH)    EKG: None  Radiology: CT CHEST ABDOMEN PELVIS WO CONTRAST Result Date: 01/28/2024  CLINICAL DATA:  Unintentional weight loss. Metastatic lung cancer. Started chemotherapy today. Diarrhea. EXAM: CT CHEST, ABDOMEN AND PELVIS WITHOUT CONTRAST TECHNIQUE: Multidetector CT imaging of the chest, abdomen and pelvis was performed following the standard protocol without IV contrast. RADIATION DOSE  REDUCTION: This exam was performed according to the departmental dose-optimization program which includes automated exposure control, adjustment of the mA and/or kV according to patient size and/or use of iterative reconstruction technique. COMPARISON:  PET CT 01/09/2024 FINDINGS: CT CHEST FINDINGS Cardiovascular: Right chest port tip in the SVC. The heart is enlarged. Left-sided pacemaker with lead tips in the right atrium and ventricle. Coronary artery calcifications. Aortic atherosclerosis. The main pulmonary artery is dilated at 3.4 cm. No significant pericardial effusion. Mediastinum/Nodes: Adenopathy assessment is limited in the absence of IV contrast. Subcarinal lymph node is grossly unchanged measuring 18 mm short axis series 2, image 29, previously 19 mm on PET. Multiple small and borderline upper mediastinal nodes. Unremarkable appearance of the esophagus. Lungs/Pleura: Right upper lobe perihilar mass measures 3.8 x 3.1 cm, series 5, image 54 previously 3.5 x 2.9 cm on PET. There is increasing perifissural nodularity in the right major fissure. Severe narrowing of the right upper and middle lobe bronchi. With diffuse bronchial thickening in the right upper lobe. Moderate right pleural effusion is new from prior PET. Mild emphysema with moderate bronchial thickening. Retained secretions within the dependent trachea and left mainstem bronchus. Musculoskeletal: Known osseous metastatic disease with numerous lytic lesions. Expansile lesion involving the right anterior second rib is increased in size currently 4.3 x 2.7 cm, series 2, image 16, previously 4 x 2.2 cm. New central hypodensity suggests necrosis. Posterior right third rib lesion measures 4.5 cm, previously 4.2 cm. Expansile right ninth rib lesion measures 3.8 cm, series 2, image 35, previously 3.1 cm. Spinal lesion involving T9 involve the posterior cortex with epidural extension. Near complete replacement of T11 vertebral body with extraosseous  extension and mass effect on the spinal canal. CT ABDOMEN PELVIS FINDINGS Hepatobiliary: No evidence of focal liver lesion on this unenhanced exam. High-density material within partially distended gallbladder may represent sludge. No pericholecystic inflammation. Pancreas: No ductal dilatation or inflammation. Spleen: No evidence of focal lesion on this unenhanced exam. Splenule noted inferiorly. Adrenals/Urinary Tract: Adrenal thickening without dominant nodule. No hydronephrosis. Renal vascular calcifications without renal calculi. Foley catheter decompresses the urinary bladder. Stomach/Bowel: Gaseous distention of stomach, small bowel and transverse colon, without significant change from prior PET. No transition point or evidence of obstruction. No convincing bowel inflammation or wall thickening. There is colonic redundancy. Vascular/Lymphatic: Atherosclerosis of needed abdominal aorta. Aorto bifem bypass graft. No discretely enlarged lymph nodes. Reproductive: Enlarged prostate. Other: Left upper quadrant peritoneal nodularity, stable. Representative nodule measures 2.2 x 1.3 cm, series 2, image 58 mild generalized stranding of the intra-abdominal fat. Trace free fluid in the pericolic gutters. Trace free fluid in the pelvis. Unchanged. Musculoskeletal: Again seen diffuse osseous metastatic disease numerous lytic lesions in the spine and pelvis. Expansile left ischial lesion has increased measuring 7 x 6.3 cm, previously 6.7 x 5.5 cm, series 2, image 121. Expansile lesion within the upper sacrum has progressed. IMPRESSION: 1. Metastatic lung cancer with progression in metastatic disease from prior. Primary lesion has increased in size. Many of the expansile lytic bone lesions have increased in size. 2. Moderate right pleural effusion is new from prior exam. 3. Gaseous distention of stomach, small bowel and transverse colon, without significant change from prior PET. No transition point or evidence  of  obstruction. Aortic Atherosclerosis (ICD10-I70.0) and Emphysema (ICD10-J43.9). Electronically Signed   By: Andrea Gasman M.D.   On: 01/28/2024 20:51   DG Chest Port 1 View Result Date: 01/28/2024 CLINICAL DATA:  Possible sepsis.  Diarrhea.  Lung carcinoma. EXAM: PORTABLE CHEST 1 VIEW COMPARISON:  01/27/2024 FINDINGS: Stable heart size and appearance of pacing/ICD device. Stable appearance of Port-A-Cath. Stable volume loss and scarring in right lung. No overt pulmonary edema, airspace disease or pneumothorax. No pleural effusion identified. The visualized skeletal structures are unremarkable. IMPRESSION: No acute findings. Stable volume loss and scarring in the right lung. Electronically Signed   By: Marcey Moan M.D.   On: 01/28/2024 18:04   DG Chest 2 View Result Date: 01/27/2024 CLINICAL DATA:  Shortness of breath. Cough, unspecified type. Lung cancer. EXAM: CHEST - 2 VIEW COMPARISON:  Chest radiograph 01/13/2024.  PET-CT 01/09/2024 FINDINGS: Heart size is stable. Stable appearance of the left chest ICD. Right jugular Port-A-Cath with the tip in the lower SVC region. Opacities in the right suprahilar region and right upper lung are compatible with patient's known chest disease. Negative for pneumothorax. Left lung appears clear. Gas distention of bowel loops in the visualized abdomen. IMPRESSION: 1. Opacities in the right upper lung and chest compatible with patient's known disease. 2. No acute cardiopulmonary disease. Electronically Signed   By: Juliene Balder M.D.   On: 01/27/2024 10:46     Procedures   Medications Ordered in the ED  sodium zirconium cyclosilicate  (LOKELMA ) packet 10 g (0 g Oral Hold 01/28/24 1940)  sodium bicarbonate 150 mEq in dextrose  5 % 1,150 mL infusion ( Intravenous New Bag/Given 01/28/24 2041)  norepinephrine  (LEVOPHED ) 4mg  in (0.016 mg/mL) premix infusion (6 mcg/min Intravenous Infusion Verify 01/28/24 2240)  0.9 %  sodium chloride  infusion (Manually program via  Guardrails IV Fluids) (has no administration in time range)  ipratropium-albuterol  (DUONEB) 0.5-2.5 (3) MG/3ML nebulizer solution 3 mL (3 mLs Nebulization Given 01/28/24 1740)  insulin  aspart (novoLOG ) injection 5 Units (5 Units Intravenous Given 01/28/24 1831)    And  dextrose  50 % solution 50 mL (50 mLs Intravenous Given 01/28/24 1827)  albuterol  (PROVENTIL ) (2.5 MG/3ML) 0.083% nebulizer solution 2.5 mg (2.5 mg Nebulization Given 01/28/24 1922)  calcium  gluconate 1 g/ 50 mL sodium chloride  IVPB (0 mg Intravenous Stopped 01/28/24 1856)  ceFEPIme  (MAXIPIME ) 2 g in sodium chloride  0.9 % 100 mL IVPB (0 g Intravenous Stopped 01/28/24 1918)  metroNIDAZOLE (FLAGYL) IVPB 500 mg (0 mg Intravenous Stopped 01/28/24 2012)  vancomycin  (VANCOCIN ) IVPB 1000 mg/200 mL premix (0 mg Intravenous Stopped 01/28/24 2000)                                    Medical Decision Making Amount and/or Complexity of Data Reviewed Labs: ordered. Radiology: ordered.  Risk OTC drugs. Prescription drug management. Decision regarding hospitalization.   This patient presents to the ED for concern of diarrhea, this involves an extensive number of treatment options, and is a complaint that carries with it a high risk of complications and morbidity.  The differential diagnosis includes infection, colitis, ischemic bowel, metabolic derangements   Co morbidities / Chronic conditions that complicate the patient evaluation  CAD, PAD, HTN, atrial fibrillation, CHF, COPD, lung cancer, DM, CKD, HLD, anemia   Additional history obtained:  Additional history obtained from EMR External records from outside source obtained and reviewed including patient's family   Lab Tests:  I Ordered, and personally interpreted labs.  The pertinent results include: Evidence of multiorgan failure: Renal failure, shock liver versus congestive hepatopathy, elevated troponin and BNP concerning for cardiogenic shock; hyperkalemia is present.  Acute on  chronic anemia is present.   Imaging Studies ordered:  I ordered imaging studies including chest x-ray, CT of chest, abdomen, pelvis I independently visualized and interpreted imaging which showed redemonstration of known metastatic lung cancer, now with progression of metastatic disease; increased lytic bone lesions; new moderate right pleural effusion; gaseous distention of stomach and bowel without evidence of obstruction I agree with the radiologist interpretation   Cardiac Monitoring: / EKG:  The patient was maintained on a cardiac monitor.  I personally viewed and interpreted the cardiac monitored which showed an underlying rhythm of: Atrial fibrillation   Problem List / ED Course / Critical interventions / Medication management  Patient presenting for multiple complaints.  He has had diarrhea since yesterday.  It is described as nonbloody.  He has had worsened fatigue and generalized weakness.  He has not been able to urinate today.  He endorses some shortness of breath.  On arrival in the ED, vital signs notable for soft blood pressure.  Current SpO2 is normal on his baseline 2 L of oxygen .  He does have some mildly increased work of breathing.  He does have some expiratory wheezing present on lung auscultation.  Patient uses inhalers only at home.  DuoNeb was ordered.  Workup was initiated.  Initial lab work concerning for multiorgan failure.  Patient has AKI and elevation of transaminases.  Hyperkalemia is present.  Temporizing medications were ordered.  Troponin is mildly elevated.  BNP is markedly elevated.  He does have a metabolic acidosis on blood gas.  Sodium bicarb infusion was ordered for his acidosis.  Broad-spectrum antibiotics were ordered.  On check of rectal temperature, patient was hypothermic at 96.4 degrees.  Warming blanket and temperature sensing Foley to be placed.  Patient does state that he wishes to be full code.  CBC shows acute on chronic anemia.  Hemoglobin today  is 7.4.  Given his history of CHF, 1 unit PRBCs was ordered.  Patient had ongoing soft pressures and tachycardia.  He remained sleeping but easily awakened.  I spoke with his son to inform him of patient's dire situation.  Patient's family did come to the ED to be with their father.  I spoke with intensivist, Dr. Layman, who accepted the patient for admission to ICU.  Patient did have further softening of blood pressures and Levophed  was initiated.  PRBC infusion was initiated.  Patient was transferred for further management. I ordered medication including broad-spectrum antibiotics form.  Treatment of sepsis; calcium  gluconate, insulin /dextrose , albuterol  for temporization of hyperkalemia; PRBCs for symptomatic anemia; Levophed  for hypotension; sodium HCO3 for metabolic acidosis Reevaluation of the patient after these medicines showed that the patient stayed the same I have reviewed the patients home medicines and have made adjustments as needed   Consultations Obtained:  I requested consultation with the intensivist, Dr. Layman,  and discussed lab and imaging findings as well as pertinent plan - they recommend: Admission to ICU   Social Determinants of Health:  Previously living independently  CRITICAL CARE Performed by: Bernardino Fireman   Total critical care time: 35 minutes  Critical care time was exclusive of separately billable procedures and treating other patients.  Critical care was necessary to treat or prevent imminent or life-threatening deterioration.  Critical care was time spent personally by  me on the following activities: development of treatment plan with patient and/or surrogate as well as nursing, discussions with consultants, evaluation of patient's response to treatment, examination of patient, obtaining history from patient or surrogate, ordering and performing treatments and interventions, ordering and review of laboratory studies, ordering and review of radiographic  studies, pulse oximetry and re-evaluation of patient's condition.      Final diagnoses:  Symptomatic anemia  Hyperkalemia  AKI (acute kidney injury)  Abnormal transaminases  Lactic acidosis  [facility])    ED Discharge Orders     None          Melvenia Motto, MD 01/28/24 2321

## 2024-01-28 NOTE — Progress Notes (Incomplete)
 Oncology Progress Note  Evaluated the patient at bedside around 3 PM.  Patient was drowsy but arousable.  Patient son at bedside.  By this time patient had multiple loose bowel stools during infusion.  Patient also has significantly functionally declined at home per son.  Patient also did not urinate all the time here in infusion clinic even after fluid boluses.  Examination: Patient drowsy but arousable.  Did not report any pain at this time.  Considering all the above symptoms, will send to ER for further evaluation.

## 2024-01-29 ENCOUNTER — Other Ambulatory Visit (HOSPITAL_COMMUNITY): Payer: Self-pay

## 2024-01-29 ENCOUNTER — Telehealth: Payer: Self-pay | Admitting: Pharmacy Technician

## 2024-01-29 ENCOUNTER — Inpatient Hospital Stay (HOSPITAL_COMMUNITY)

## 2024-01-29 ENCOUNTER — Inpatient Hospital Stay: Admitting: Licensed Clinical Social Worker

## 2024-01-29 ENCOUNTER — Ambulatory Visit

## 2024-01-29 DIAGNOSIS — R748 Abnormal levels of other serum enzymes: Secondary | ICD-10-CM

## 2024-01-29 DIAGNOSIS — R0602 Shortness of breath: Secondary | ICD-10-CM | POA: Diagnosis not present

## 2024-01-29 DIAGNOSIS — C3490 Malignant neoplasm of unspecified part of unspecified bronchus or lung: Secondary | ICD-10-CM

## 2024-01-29 LAB — POCT I-STAT 7, (LYTES, BLD GAS, ICA,H+H)
Acid-base deficit: 10 mmol/L — ABNORMAL HIGH (ref 0.0–2.0)
Bicarbonate: 16.1 mmol/L — ABNORMAL LOW (ref 20.0–28.0)
Calcium, Ion: 1 mmol/L — ABNORMAL LOW (ref 1.15–1.40)
HCT: 27 % — ABNORMAL LOW (ref 39.0–52.0)
Hemoglobin: 9.2 g/dL — ABNORMAL LOW (ref 13.0–17.0)
O2 Saturation: 98 %
Patient temperature: 36.4
Potassium: 5.4 mmol/L — ABNORMAL HIGH (ref 3.5–5.1)
Sodium: 132 mmol/L — ABNORMAL LOW (ref 135–145)
TCO2: 17 mmol/L — ABNORMAL LOW (ref 22–32)
pCO2 arterial: 32.8 mmHg (ref 32–48)
pH, Arterial: 7.295 — ABNORMAL LOW (ref 7.35–7.45)
pO2, Arterial: 109 mmHg — ABNORMAL HIGH (ref 83–108)

## 2024-01-29 LAB — BPAM RBC
Blood Product Expiration Date: 202512252359
ISSUE DATE / TIME: 202512022212
Unit Type and Rh: 5100

## 2024-01-29 LAB — ECHOCARDIOGRAM COMPLETE
Height: 67.992 in
S' Lateral: 3 cm
Weight: 2250.46 [oz_av]

## 2024-01-29 LAB — COMPREHENSIVE METABOLIC PANEL WITH GFR
ALT: 1735 U/L — ABNORMAL HIGH (ref 0–44)
ALT: 1416 U/L — ABNORMAL HIGH (ref 0–44)
AST: 4075 U/L — ABNORMAL HIGH (ref 15–41)
AST: 2114 U/L — ABNORMAL HIGH (ref 15–41)
Albumin: 2 g/dL — ABNORMAL LOW (ref 3.5–5.0)
Albumin: 1.9 g/dL — ABNORMAL LOW (ref 3.5–5.0)
Alkaline Phosphatase: 148 U/L — ABNORMAL HIGH (ref 38–126)
Alkaline Phosphatase: 142 U/L — ABNORMAL HIGH (ref 38–126)
Anion gap: 20 — ABNORMAL HIGH (ref 5–15)
Anion gap: 15 (ref 5–15)
BUN: 83 mg/dL — ABNORMAL HIGH (ref 8–23)
BUN: 93 mg/dL — ABNORMAL HIGH (ref 8–23)
CO2: 20 mmol/L — ABNORMAL LOW (ref 22–32)
CO2: 23 mmol/L (ref 22–32)
Calcium: 7.6 mg/dL — ABNORMAL LOW (ref 8.9–10.3)
Calcium: 7 mg/dL — ABNORMAL LOW (ref 8.9–10.3)
Chloride: 97 mmol/L — ABNORMAL LOW (ref 98–111)
Chloride: 99 mmol/L (ref 98–111)
Creatinine, Ser: 2.48 mg/dL — ABNORMAL HIGH (ref 0.61–1.24)
Creatinine, Ser: 2.68 mg/dL — ABNORMAL HIGH (ref 0.61–1.24)
GFR, Estimated: 26 mL/min — ABNORMAL LOW (ref >60)
GFR, Estimated: 23 mL/min — ABNORMAL LOW (ref >60)
Glucose, Bld: 196 mg/dL — ABNORMAL HIGH (ref 70–99)
Glucose, Bld: 110 mg/dL — ABNORMAL HIGH (ref 70–99)
Potassium: 5.7 mmol/L — ABNORMAL HIGH (ref 3.5–5.1)
Potassium: 4.9 mmol/L (ref 3.5–5.1)
Sodium: 137 mmol/L (ref 135–145)
Sodium: 137 mmol/L (ref 135–145)
Total Bilirubin: 4.1 mg/dL — ABNORMAL HIGH (ref 0.0–1.2)
Total Bilirubin: 2.4 mg/dL — ABNORMAL HIGH (ref 0.0–1.2)
Total Protein: 5.5 g/dL — ABNORMAL LOW (ref 6.5–8.1)
Total Protein: 5.1 g/dL — ABNORMAL LOW (ref 6.5–8.1)

## 2024-01-29 LAB — PHOSPHORUS
Phosphorus: 7.2 mg/dL — ABNORMAL HIGH (ref 2.5–4.6)
Phosphorus: 6.8 mg/dL — ABNORMAL HIGH (ref 2.5–4.6)
Phosphorus: 6.6 mg/dL — ABNORMAL HIGH (ref 2.5–4.6)
Phosphorus: 6.4 mg/dL — ABNORMAL HIGH (ref 2.5–4.6)

## 2024-01-29 LAB — BASIC METABOLIC PANEL WITH GFR
Anion gap: 13 (ref 5–15)
Anion gap: 14 (ref 5–15)
BUN: 95 mg/dL — ABNORMAL HIGH (ref 8–23)
BUN: 96 mg/dL — ABNORMAL HIGH (ref 8–23)
CO2: 22 mmol/L (ref 22–32)
CO2: 18 mmol/L — ABNORMAL LOW (ref 22–32)
Calcium: 6.9 mg/dL — ABNORMAL LOW (ref 8.9–10.3)
Calcium: 6.6 mg/dL — ABNORMAL LOW (ref 8.9–10.3)
Chloride: 100 mmol/L (ref 98–111)
Chloride: 102 mmol/L (ref 98–111)
Creatinine, Ser: 2.73 mg/dL — ABNORMAL HIGH (ref 0.61–1.24)
Creatinine, Ser: 2.67 mg/dL — ABNORMAL HIGH (ref 0.61–1.24)
GFR, Estimated: 23 mL/min — ABNORMAL LOW (ref >60)
GFR, Estimated: 23 mL/min — ABNORMAL LOW (ref >60)
Glucose, Bld: 114 mg/dL — ABNORMAL HIGH (ref 70–99)
Glucose, Bld: 109 mg/dL — ABNORMAL HIGH (ref 70–99)
Potassium: 4.8 mmol/L (ref 3.5–5.1)
Potassium: 5.1 mmol/L (ref 3.5–5.1)
Sodium: 135 mmol/L (ref 135–145)
Sodium: 134 mmol/L — ABNORMAL LOW (ref 135–145)

## 2024-01-29 LAB — GLUCOSE, CAPILLARY
Glucose-Capillary: 173 mg/dL — ABNORMAL HIGH (ref 70–99)
Glucose-Capillary: 174 mg/dL — ABNORMAL HIGH (ref 70–99)
Glucose-Capillary: 149 mg/dL — ABNORMAL HIGH (ref 70–99)
Glucose-Capillary: 107 mg/dL — ABNORMAL HIGH (ref 70–99)
Glucose-Capillary: 110 mg/dL — ABNORMAL HIGH (ref 70–99)
Glucose-Capillary: 110 mg/dL — ABNORMAL HIGH (ref 70–99)

## 2024-01-29 LAB — CBC WITH DIFFERENTIAL/PLATELET
Abs Immature Granulocytes: 0.08 K/uL — ABNORMAL HIGH (ref 0.00–0.07)
Basophils Absolute: 0 K/uL (ref 0.0–0.1)
Basophils Relative: 1 %
Eosinophils Absolute: 0 K/uL (ref 0.0–0.5)
Eosinophils Relative: 0 %
HCT: 30.6 % — ABNORMAL LOW (ref 39.0–52.0)
Hemoglobin: 10 g/dL — ABNORMAL LOW (ref 13.0–17.0)
Immature Granulocytes: 2 %
Lymphocytes Relative: 1 %
Lymphs Abs: 0 K/uL — ABNORMAL LOW (ref 0.7–4.0)
MCH: 26 pg (ref 26.0–34.0)
MCHC: 32.7 g/dL (ref 30.0–36.0)
MCV: 79.5 fL — ABNORMAL LOW (ref 80.0–100.0)
Monocytes Absolute: 0.1 K/uL (ref 0.1–1.0)
Monocytes Relative: 1 %
Neutro Abs: 4.1 K/uL (ref 1.7–7.7)
Neutrophils Relative %: 95 %
Platelets: 72 K/uL — ABNORMAL LOW (ref 150–400)
RBC: 3.85 MIL/uL — ABNORMAL LOW (ref 4.22–5.81)
RDW: 16.9 % — ABNORMAL HIGH (ref 11.5–15.5)
Smear Review: NORMAL
WBC: 4.3 K/uL (ref 4.0–10.5)
nRBC: 1.4 % — ABNORMAL HIGH (ref 0.0–0.2)

## 2024-01-29 LAB — TYPE AND SCREEN
ABO/RH(D): B POS
ABO/RH(D): B POS
Antibody Screen: NEGATIVE
Antibody Screen: NEGATIVE
Unit division: 0

## 2024-01-29 LAB — POTASSIUM
Potassium: 4.6 mmol/L (ref 3.5–5.1)
Potassium: 4.9 mmol/L (ref 3.5–5.1)

## 2024-01-29 LAB — LACTIC ACID, PLASMA
Lactic Acid, Venous: 5.1 mmol/L (ref 0.5–1.9)
Lactic Acid, Venous: 4 mmol/L (ref 0.5–1.9)
Lactic Acid, Venous: 2.3 mmol/L (ref 0.5–1.9)
Lactic Acid, Venous: 1.7 mmol/L (ref 0.5–1.9)

## 2024-01-29 LAB — COOXEMETRY PANEL
Carboxyhemoglobin: 1.8 % — ABNORMAL HIGH (ref 0.5–1.5)
Carboxyhemoglobin: 1.6 % — ABNORMAL HIGH (ref 0.5–1.5)
Methemoglobin: 0.7 % (ref 0.0–1.5)
Methemoglobin: <0.7 % (ref 0.0–1.5)
O2 Saturation: 49.2 %
O2 Saturation: 42.5 %
Total hemoglobin: 9.2 g/dL — ABNORMAL LOW (ref 12.0–16.0)
Total hemoglobin: 9.7 g/dL — ABNORMAL LOW (ref 12.0–16.0)

## 2024-01-29 LAB — MAGNESIUM
Magnesium: 2.5 mg/dL — ABNORMAL HIGH (ref 1.7–2.4)
Magnesium: 2.4 mg/dL (ref 1.7–2.4)
Magnesium: 2.1 mg/dL (ref 1.7–2.4)
Magnesium: 2.2 mg/dL (ref 1.7–2.4)

## 2024-01-29 LAB — LACTATE DEHYDROGENASE
LDH: >2500 U/L — ABNORMAL HIGH (ref 105–235)
LDH: 1438 U/L — ABNORMAL HIGH (ref 105–235)
LDH: 1093 U/L — ABNORMAL HIGH (ref 105–235)
LDH: 733 U/L — ABNORMAL HIGH (ref 105–235)

## 2024-01-29 LAB — HEPARIN LEVEL (UNFRACTIONATED): Heparin Unfractionated: >1.1 [IU]/mL — ABNORMAL HIGH (ref 0.30–0.70)

## 2024-01-29 LAB — APTT: aPTT: 47 s — ABNORMAL HIGH (ref 24–36)

## 2024-01-29 LAB — CK TOTAL AND CKMB (NOT AT ARMC)
CK, MB: 9 ng/mL — ABNORMAL HIGH (ref 0.5–5.0)
Total CK: 90 U/L (ref 49–397)

## 2024-01-29 LAB — MRSA NEXT GEN BY PCR, NASAL: MRSA by PCR Next Gen: NOT DETECTED

## 2024-01-29 LAB — URIC ACID: Uric Acid, Serum: 11.6 mg/dL — ABNORMAL HIGH (ref 3.7–8.6)

## 2024-01-29 MED ORDER — DEXAMETHASONE 4 MG PO TABS
8.0000 mg | ORAL_TABLET | Freq: Every day | ORAL | Status: DC
  Administered 2024-01-30: 8 mg via ORAL
  Filled 2024-01-29 (×2): qty 2

## 2024-01-29 MED ORDER — LEVOTHYROXINE SODIUM 50 MCG PO TABS
50.0000 ug | ORAL_TABLET | Freq: Every day | ORAL | Status: DC
  Administered 2024-01-29 – 2024-01-30 (×2): 50 ug via ORAL
  Filled 2024-01-29 (×2): qty 1

## 2024-01-29 MED ORDER — DOCUSATE SODIUM 100 MG PO CAPS: 100.0000 mg | ORAL_CAPSULE | Freq: Two times a day (BID) | ORAL | Status: DC | PRN

## 2024-01-29 MED ORDER — POLYETHYLENE GLYCOL 3350 17 G PO PACK: 17.0000 g | PACK | Freq: Every day | ORAL | Status: DC | PRN

## 2024-01-29 MED ORDER — ACETAMINOPHEN 500 MG PO TABS
1000.0000 mg | ORAL_TABLET | Freq: Three times a day (TID) | ORAL | Status: DC | PRN
  Administered 2024-01-30: 1000 mg via ORAL
  Filled 2024-01-29: qty 2

## 2024-01-29 MED ORDER — DEXAMETHASONE 4 MG PO TABS
8.0000 mg | ORAL_TABLET | Freq: Every day | ORAL | Status: DC
  Administered 2024-01-29: 8 mg via ORAL
  Filled 2024-01-29: qty 2

## 2024-01-29 MED ORDER — HEPARIN (PORCINE) 25000 UT/250ML-% IV SOLN
900.0000 [IU]/h | INTRAVENOUS | Status: DC
  Administered 2024-01-29: 800 [IU]/h via INTRAVENOUS
  Filled 2024-01-29: qty 250

## 2024-01-29 MED ORDER — SODIUM CHLORIDE 0.9 % IV SOLN
6.0000 mg | Freq: Once | INTRAVENOUS | Status: AC
  Administered 2024-01-29: 6 mg via INTRAVENOUS
  Filled 2024-01-29: qty 4

## 2024-01-29 MED ORDER — ONDANSETRON HCL 4 MG/2ML IJ SOLN: 4.0000 mg | Freq: Four times a day (QID) | INTRAMUSCULAR | Status: DC | PRN

## 2024-01-29 MED ORDER — ENSURE PLUS HIGH PROTEIN PO LIQD
237.0000 mL | Freq: Three times a day (TID) | ORAL | Status: DC
  Administered 2024-01-29: 100 mL via ORAL
  Administered 2024-01-30: 237 mL via ORAL

## 2024-01-29 MED ORDER — COLLAGENASE 250 UNIT/GM EX OINT
1.0000 | TOPICAL_OINTMENT | Freq: Every day | CUTANEOUS | Status: DC
  Administered 2024-01-29 – 2024-01-30 (×2): 1 via TOPICAL
  Filled 2024-01-29 (×2): qty 30

## 2024-01-29 MED ORDER — INSULIN ASPART 100 UNIT/ML IJ SOLN
0.0000 [IU] | INTRAMUSCULAR | Status: DC
  Administered 2024-01-29: 2 [IU] via SUBCUTANEOUS
  Administered 2024-01-29: 1 [IU] via SUBCUTANEOUS
  Administered 2024-01-29 (×2): 2 [IU] via SUBCUTANEOUS
  Administered 2024-01-30: 1 [IU] via SUBCUTANEOUS
  Filled 2024-01-29 (×2): qty 2
  Filled 2024-01-29 (×2): qty 1
  Filled 2024-01-29: qty 2

## 2024-01-29 MED ORDER — IPRATROPIUM-ALBUTEROL 0.5-2.5 (3) MG/3ML IN SOLN: 3.0000 mL | Freq: Four times a day (QID) | RESPIRATORY_TRACT | Status: DC | PRN

## 2024-01-29 MED ORDER — PANTOPRAZOLE SODIUM 40 MG PO TBEC
40.0000 mg | DELAYED_RELEASE_TABLET | Freq: Every day | ORAL | Status: DC
  Administered 2024-01-29 – 2024-01-30 (×2): 40 mg via ORAL
  Filled 2024-01-29 (×2): qty 1

## 2024-01-29 MED ORDER — INSULIN ASPART 100 UNIT/ML IV SOLN
5.0000 [IU] | Freq: Once | INTRAVENOUS | Status: AC
  Administered 2024-01-29: 5 [IU] via INTRAVENOUS
  Filled 2024-01-29: qty 5

## 2024-01-29 MED ORDER — DOBUTAMINE-DEXTROSE 4-5 MG/ML-% IV SOLN
5.0000 ug/kg/min | INTRAVENOUS | Status: DC
  Administered 2024-01-29: 5 ug/kg/min via INTRAVENOUS
  Filled 2024-01-29: qty 250

## 2024-01-29 MED ORDER — PROCHLORPERAZINE EDISYLATE 10 MG/2ML IJ SOLN
10.0000 mg | Freq: Four times a day (QID) | INTRAMUSCULAR | Status: DC | PRN
  Filled 2024-01-29: qty 2

## 2024-01-29 MED ORDER — SODIUM ZIRCONIUM CYCLOSILICATE 10 G PO PACK
10.0000 g | PACK | Freq: Once | ORAL | Status: AC
  Administered 2024-01-29: 10 g via ORAL
  Filled 2024-01-29: qty 1

## 2024-01-29 MED ORDER — LACTATED RINGERS IV SOLN: INTRAVENOUS | Status: DC

## 2024-01-29 MED ORDER — DEXTROSE 50 % IV SOLN
1.0000 | Freq: Once | INTRAVENOUS | Status: AC
  Administered 2024-01-29: 50 mL via INTRAVENOUS
  Filled 2024-01-29: qty 50

## 2024-01-29 NOTE — Plan of Care (Signed)
  Problem: Clinical Measurements: Goal: Ability to maintain clinical measurements within normal limits will improve Outcome: Progressing   Problem: Clinical Measurements: Goal: Will remain free from infection Outcome: Progressing   Problem: Clinical Measurements: Goal: Diagnostic test results will improve Outcome: Progressing   Problem: Clinical Measurements: Goal: Respiratory complications will improve Outcome: Progressing   Problem: Clinical Measurements: Goal: Cardiovascular complication will be avoided Outcome: Progressing   Problem: Nutrition: Goal: Adequate nutrition will be maintained Outcome: Progressing   Problem: Elimination: Goal: Will not experience complications related to bowel motility Outcome: Progressing   Problem: Pain Managment: Goal: General experience of comfort will improve and/or be controlled Outcome: Progressing   Problem: Safety: Goal: Ability to remain free from injury will improve Outcome: Progressing   Problem: Skin Integrity: Goal: Risk for impaired skin integrity will decrease Outcome: Progressing   Problem: Tissue Perfusion: Goal: Adequacy of tissue perfusion will improve Outcome: Progressing   Plan of care, assessment, monitoring, treatment, intervention (s) ongoing, see MAR see flowsheet

## 2024-01-29 NOTE — Consult Note (Addendum)
 WOC Nurse Consult Note: Reason for Consult: sacral wound  Wound type: 1.  Unstageable Pressure Injury Sacrum  2.  Deep Tissue Pressure Injury medial buttocks extending to coccyx purple maroon discoloration  Pressure Injury POA: Yes Measurement: see nursing flowsheet  Wound bed:70% black tan necrotic 30% red  Drainage (amount, consistency, odor) see nursing flowsheet  Periwound: dark tissue likely began as deep tissue pressure injury  Dressing procedure/placement/frequency: Cleanse sacral wound with Vashe, do not rinse. Apply 1/4 thick layer of Santyl  to wound bed, top with saline moist gauze, dry gauze and silicone foam or ABD pad and clothe tape whichever is preferred.   Patient should remain on a low air loss mattress if moved out of ICU setting for pressure redistribution and moisture management.   POC discussed with bedside nurse. WOC team will not follow. Re-consult if further needs arise.   Thank you,    Powell Bar MSN, RN-BC, [employer]

## 2024-01-29 NOTE — Progress Notes (Signed)
 eLink Physician-Brief Progress Note Patient Name: Peter Lambert DOB: [birth year] MRN: [id number]   Date of Service  01/29/2024  HPI/Events of Note  78 year old man who presented to [facility] 12/2 as a transfer from [facility] for weakness and hypotension found to have shock complicated by AKI, acute liver injury, all in the setting of metastatic non-small cell lung cancer and coronary artery disease with an NSTEMI.  Vitals, labs, and imaging reviewed with evidence of progressive cancer, new right-sided pleural effusion  eICU Interventions  Able to wean off of norepinephrine , undergoing hydration with bicarb.  Oncology consulted, maintaining dexamethasone  post chemo.  No antibiotics for the time being.  DVT prophylaxis with Eliquis /SCDs GI prophylaxis with pantoprazole      Intervention Category Evaluation Type: New Patient Evaluation  Tobie Hellen 01/29/2024, 12:35 AM

## 2024-01-29 NOTE — Consult Note (Signed)
 Advanced Heart Failure Team Consult Note  Primary Physician: Marvine Rush, MD Cardiologist:  Dorn Lesches, MD  Reason for Consultation: acute systolic heart failure HPI:    Peter Lambert is seen today for evaluation of acute systolic heart failure at the request of Dr. Sharie.   78 y.o. male with history of HTN, HLD, CAD, NSEMI s/p DES TO LAD in 2010), VF arrest (due to RCA occlusion) s/p ICD, Afib on eliquis , HFpEF, PAD, COPD, T2DM, hypothyroidism, GIB and gout.   Recently diagnosed with metastatic non-small cell lung CA (mets to bone/spine) about a month ago. Family reports that since then he has had a significant functional decline, progressing to bed-bound within the last week.   Presented to [facility] ED 01/28/24 from [facility] for tachycardia, weakness and stool incontinence after receiving first chemotherapy infusion at [facility]. On ED arrival, patient was afebrile with HR 110, BP 109/73, RR 20, SpO2 96% on 2LNC. Labs were notable for WBC 5.0, Hgb 7.4 (baseline 8.5-10), Plt 122. Na 137, K 6.0, CO2 12, BUN 69/Cr 1.95. AST/ALT 1630/827, Alk Phos 161, Tbili 2.0. LA 8.9 > 9.0. Trop 95 > 92 (suspect demand-related), BNP 11,858. Infectious w/u negative. BCx pending. CXR negative for acute findings. CT C/A/P demonstrated metastatic lung CA with progression of metastatic disease, increased size of primary lesion and increased size of lytic bone lesions; new moderate R pleural effusion.   He developed hypotension and was transferred to [facility] ICU 12/3 for undifferentiated shock. Now in multi-organ failure felt to be due to tumor lysis syndrome post chemotherapy. Echo performed showing EF 30-35%. HF consulted.  On exam, patient is very weak and lethargic. His complaint is pain in his R leg from gout. Noted to be in afib RVR on tele. On norepinephrine  for BP support. Daughter-in-law at bedside, she was updated by our team on the severity of his condition and how it is effecting all of  his organs including his heart. Also discussed this with his son and sister.   Home Medications Prior to Admission medications   Medication Sig Start Date End Date Taking? Authorizing Provider  acetaminophen  (TYLENOL ) 500 MG tablet Take 2 tablets (1,000 mg total) by mouth 3 (three) times daily. Patient taking differently: Take 1,000 mg by mouth every 8 (eight) hours as needed for mild pain (pain score 1-3). 01/19/24  Yes Amin, Ankit C, MD  albuterol  (VENTOLIN  HFA) 108 (90 Base) MCG/ACT inhaler Inhale 2 puffs into the lungs every 6 (six) hours as needed for wheezing or shortness of breath. 01/19/24  Yes Amin, Ankit C, MD  apixaban  (ELIQUIS ) 5 MG TABS tablet Take 1 tablet (5 mg total) by mouth 2 (two) times daily. Hold Eliquis  on 01/05/2024 for bronchoscopy 01/03/24  Yes Odell Celinda Balo, MD  colchicine  0.6 MG tablet Take 1 tablet (0.6 mg total) by mouth 2 (two) times daily. 01/25/24  Yes Cleotilde Rogue, MD  [employer] GENTLE LAXATIVE 5 MG EC tablet TAKE 1 TABLET BY MOUTH DAILY AS NEEDED FOR MODERATE CONSTIPATION. TAKE TWO DAYS BEFORE PROCEDURE Patient taking differently: Take 5 mg by mouth daily as needed for mild constipation or moderate constipation. 05/30/20  Yes Eartha Angelia Sieving, MD  dexamethasone  (DECADRON ) 4 MG tablet Take 2 tablets (8mg ) by mouth daily starting the day after carboplatin for 3 days. Take with food 01/27/24  Yes Davonna Siad, MD  feeding supplement (ENSURE PLUS HIGH PROTEIN) LIQD Take 237 mLs by mouth 3 (three) times daily between meals. 01/19/24  Yes Amin, Ankit C, MD  furosemide  (LASIX ) 20 MG tablet Take 20 mg by mouth daily.   Yes [provider]  levothyroxine  (SYNTHROID ) 50 MCG tablet Take 50 mcg by mouth daily before breakfast.   Yes [provider]  lidocaine -prilocaine (EMLA) cream Apply 1 Application topically as needed (Apply 30 minutes to 1 hour prior to port access.). 01/27/24  Yes Kandala, Hyndavi, MD  Multiple Vitamin (MULTIVITAMIN WITH  MINERALS) TABS tablet Take 1 tablet by mouth daily. 01/19/24  Yes Amin, Ankit C, MD  nitroGLYCERIN  (NITROSTAT ) 0.4 MG SL tablet Place 1 tablet (0.4 mg total) under the tongue every 5 (five) minutes as needed for chest pain. Patient must keep upcoming appointment for further refills 07/20/21  Yes Court Dorn PARAS, MD  ondansetron  (ZOFRAN ) 8 MG tablet Take 1 tablet (8 mg total) by mouth every 8 (eight) hours as needed for nausea or vomiting. Start on the third day after carboplatin. 01/27/24  Yes Davonna Siad, MD  oxyCODONE  (OXY IR/ROXICODONE ) 5 MG immediate release tablet Take 1-2 tablets (5-10 mg total) by mouth every 4 (four) hours as needed for moderate pain (pain score 4-6), severe pain (pain score 7-10) or breakthrough pain. 01/19/24  Yes Amin, Ankit C, MD  polyethylene glycol powder (MIRALAX ) 17 GM/SCOOP powder Take 17 g by mouth daily as needed for moderate constipation or mild constipation. Dissolve 1 capful (17g) in 4-8 ounces of liquid and take by mouth daily. 01/19/24  Yes Amin, Ankit C, MD  potassium chloride  SA (KLOR-CON ) 20 MEQ tablet Take 20 mEq by mouth daily after lunch.   Yes [provider]  prochlorperazine  (COMPAZINE ) 10 MG tablet Take 1 tablet (10 mg total) by mouth every 6 (six) hours as needed for nausea or vomiting. 01/27/24  Yes Davonna Siad, MD  senna-docusate (SENOKOT-S) 8.6-50 MG tablet Take 1 tablet by mouth at bedtime. 01/19/24  Yes Amin, Ankit C, MD  omeprazole  (PRILOSEC) 20 MG capsule Take 20 mg by mouth daily after lunch.    [provider]  potassium chloride  (KLOR-CON ) 20 MEQ packet TAKE 1 TABLET BY MOUTH EVERY DAY 12/24/12 07/14/13  Court Dorn PARAS, MD    Past Medical History: Past Medical History:  Diagnosis Date   Acute upper GI bleed    Anemia    Anticoagulated on apixaban     Cardiac arrest with ventricular fibrillation (HCC) 2021   CHF (congestive heart failure) (HCC)    Chronic a-fib (HCC)    Coronary artery disease    a. s/p  NSTEMI in 2010 with DES to proximal LAD with D1 jailed and angioplasty alone to ostium   Dyspnea    GERD (gastroesophageal reflux disease)    Hyperlipidemia    Hypertension    Mediastinal lymphadenopathy    NSTEMI (non-ST elevated myocardial infarction) (HCC) 2010   Peripheral arterial disease    S/P coronary artery stent placement    S/P ICD (internal cardiac defibrillator) procedure    Squamous cell carcinoma of lung, stage IV, right (HCC)    Diagnosed 2025, metastatic to bone/spine   Tobacco abuse    Type 2 diabetes mellitus (HCC)     Past Surgical History: Past Surgical History:  Procedure Laterality Date   CARDIAC SURGERY     COLONOSCOPY WITH PROPOFOL  N/A 01/08/2020   Procedure: COLONOSCOPY WITH PROPOFOL ;  Surgeon: Cindie Carlin POUR, DO;  Location: [facility] ENDO [facility];  Service: Endoscopy;  Laterality: N/A;   COLONOSCOPY WITH PROPOFOL  N/A 05/03/2020   Hung:entire examined colon normal   CORONARY  STENT INTERVENTION N/A 09/03/2019   Procedure: CORONARY STENT INTERVENTION;  Surgeon: Verlin Lonni BIRCH, MD;  Location: [facility] INVASIVE CV LAB;  Service: Cardiovascular;  Laterality: N/A;   CORONARY STENT PLACEMENT     ENDOBRONCHIAL ULTRASOUND Bilateral 01/07/2024   Procedure: ENDOBRONCHIAL ULTRASOUND (EBUS);  Surgeon: Malka Domino, MD;  Location: [facility] ORS;  Service: Pulmonary;  Laterality: Bilateral;   ENTEROSCOPY N/A 05/03/2020   hung:normal esophagus, normal stomach. Three non-bleeding angiodysplastic lesions in the duodenum. Treated with a monopolar probe. A few non-bleeding angiodysplastic lesions in the jejunum. Treated with a monopolar probe. No specimens collected.   ESOPHAGOGASTRODUODENOSCOPY (EGD) WITH PROPOFOL  N/A 01/06/2020   Procedure: ESOPHAGOGASTRODUODENOSCOPY (EGD) WITH PROPOFOL ;  Surgeon: Golda Claudis PENNER, MD;  Location: [facility];  Service: Endoscopy;  Laterality: N/A;   HOT HEMOSTASIS N/A 05/03/2020   Procedure: HOT HEMOSTASIS (ARGON PLASMA COAGULATION/BICAP);   Surgeon: Rollin Dover, MD;  Location: [facility] ENDOSCOPY;  Service: Gastroenterology;  Laterality: N/A;   ICD IMPLANT N/A 09/04/2019   Procedure: ICD IMPLANT;  Surgeon: Waddell Danelle ORN, MD;  Location: [facility] INVASIVE CV LAB;  Service: Cardiovascular;  Laterality: N/A;   IR IMAGING GUIDED PORT INSERTION  01/17/2024   RIGHT/LEFT HEART CATH AND CORONARY ANGIOGRAPHY N/A 09/03/2019   Procedure: RIGHT/LEFT HEART CATH AND CORONARY ANGIOGRAPHY;  Surgeon: Cherrie Toribio SAUNDERS, MD;  Location: [facility] INVASIVE CV LAB;  Service: Cardiovascular;  Laterality: N/A;    Family History: Family History  Problem Relation Age of Onset   Colon cancer Mother        thinks it was colon cancer   Cancer Mother        not sure location    Prostate cancer Father    Dementia Sister    Colon cancer Brother    Stomach cancer Neg Hx     Social History: Social History   Socioeconomic History   Marital status: Divorced    Spouse name: Not on file   Number of children: Not on file   Years of education: Not on file   Highest education level: Not on file  Occupational History   Not on file  Tobacco Use   Smoking status: Former    Current packs/day: 0.50    Average packs/day: 0.5 packs/day for 52.0 years (26.0 ttl pk-yrs)    Types: Cigarettes   Smokeless tobacco: Never  Vaping Use   Vaping status: Never Used  Substance and Sexual Activity   Alcohol use: No   Drug use: No   Sexual activity: Never  Other Topics Concern   Not on file  Social History Narrative   Not on file   Social Drivers of Health   Financial Resource Strain: Not on file  Food Insecurity: No Food Insecurity (01/15/2024)   Hunger Vital Sign    Worried About Running Out of Food in the Last Year: Never true    Ran Out of Food in the Last Year: Never true  Transportation Needs: No Transportation Needs (01/15/2024)   PRAPARE - Administrator, Civil Service (Medical): No    Lack of Transportation (Non-Medical): No  Physical Activity: Not  on file  Stress: Not on file  Social Connections: Moderately Integrated (01/15/2024)   Social Connection and Isolation Panel    Frequency of Communication with Friends and Family: Three times a week    Frequency of Social Gatherings with Friends and Family: Three times a week    Attends Religious Services: 1 to 4 times per year    Active Member  of Clubs or Organizations: Yes    Attends Banker Meetings: 1 to 4 times per year    Marital Status: Divorced    Allergies:  No Known Allergies  Objective:    Vital Signs:   Temp:  [96.3 F (35.7 C)-97.7 F (36.5 C)] 97.7 F (36.5 C) (12/03 1500) Pulse Rate:  [80-172] 114 (12/03 1100) Resp:  [8-35] 17 (12/03 1500) BP: (75-114)/(51-93) 107/66 (12/03 1400) SpO2:  [84 %-100 %] 94 % (12/03 1100) Weight:  [63.8 kg] 63.8 kg (12/03 0411) Last BM Date : 01/29/24  Weight change: Filed Weights   01/28/24 2345 01/29/24 0411  Weight: 63.8 kg 63.8 kg    Intake/Output:  Intake/Output Summary (Last 24 hours) at 01/29/2024 1658 Last data filed at 01/29/2024 1200 Gross per 24 hour  Intake 1349.2 ml  Output 450 ml  Net 899.2 ml    Physical Exam    General: Critically-ill appearing. No distress  Cardiac:  Tachycardic Abdomen: distended.  Extremities: Warm and dry.  No edema.  Neuro: Drowsy, hoarse  Telemetry   AF 120-130s  Labs   Basic Metabolic Panel: Recent Labs  Lab 01/28/24 1705 01/29/24 0012 01/29/24 0300 01/29/24 0937 01/29/24 1526  NA 137 132* 137  --  137  K 6.0* 5.4* 5.7* 4.6 4.9  CL 101  --  97*  --  99  CO2 12*  --  20*  --  23  GLUCOSE 93  --  196*  --  110*  BUN 69*  --  83*  --  93*  CREATININE 1.95*  --  2.48*  --  2.68*  CALCIUM  7.9*  --  7.6*  --  7.0*  MG 2.8*  --  2.5*  --  2.4  PHOS  --   --  7.2*  --  6.8*    Liver Function Tests: Recent Labs  Lab 01/28/24 1705 01/29/24 0300 01/29/24 1526  AST 1,630* 4,075* 2,114*  ALT 827* 1,735* 1,416*  ALKPHOS 161* 148* 142*  BILITOT 2.0*  4.1* 2.4*  PROT 6.1* 5.5* 5.1*  ALBUMIN 3.0* 2.0* 1.9*   No results for input(s): LIPASE, AMYLASE in the last 168 hours. No results for input(s): AMMONIA in the last 168 hours.  CBC: Recent Labs  Lab 01/28/24 1705 01/28/24 1923 01/29/24 0012 01/29/24 0701  WBC ACCURACY OF RESULTS QUESTIONABLE. RECOMMEND RECOLLECT TO VERIFY. 5.0  --  4.3  NEUTROABS ACCURACY OF RESULTS QUESTIONABLE. RECOMMEND RECOLLECT TO VERIFY. 4.4  --  4.1  HGB ACCURACY OF RESULTS QUESTIONABLE. RECOMMEND RECOLLECT TO VERIFY. 7.4* 9.2* 10.0*  HCT ACCURACY OF RESULTS QUESTIONABLE. RECOMMEND RECOLLECT TO VERIFY. 23.9* 27.0* 30.6*  MCV ACCURACY OF RESULTS QUESTIONABLE. RECOMMEND RECOLLECT TO VERIFY. 83.6  --  79.5*  PLT ACCURACY OF RESULTS QUESTIONABLE. RECOMMEND RECOLLECT TO VERIFY. 122*  --  72*    Cardiac Enzymes: Recent Labs  Lab 01/29/24 0937  CKTOTAL 90  CKMB 9.0*    BNP: BNP (last 3 results) Recent Labs    01/01/24 1705  BNP 224.1*    ProBNP (last 3 results) Recent Labs    01/28/24 1705  PROBNP 11,858.0*     CBG: Recent Labs  Lab 01/28/24 2337 01/29/24 0338 01/29/24 0739 01/29/24 1130 01/29/24 1518  GLUCAP 169* 173* 174* 149* 107*    Coagulation Studies: No results for input(s): LABPROT, INR in the last 72 hours.   Imaging   VAS US  LOWER EXTREMITY VENOUS (DVT) Result Date: 01/29/2024  Lower Venous DVT Study Patient Name:  Peter  JONELLE Lambert  Date of Exam:   01/29/2024 Medical Rec #: [id number]        Accession #:    [id number] Date of Birth: 1945-06-10       Patient Gender: M Patient Age:   73 years Exam Location:  [facility] Procedure:      VAS US  LOWER EXTREMITY VENOUS (DVT) Referring Phys: DEWARD HOFFMAN --------------------------------------------------------------------------------  Indications: SOB.  Risk Factors: Lung cancer, HTN, HLD CAD, PVD. Comparison Study: No recent priors. 03/2020 Negative. Performing Technologist: Ricka Sturdivant-Jones RDMS, RVT   Examination Guidelines: A complete evaluation includes B-mode imaging, spectral Doppler, color Doppler, and power Doppler as needed of all accessible portions of each vessel. Bilateral testing is considered an integral part of a complete examination. Limited examinations for reoccurring indications may be performed as noted. The reflux portion of the exam is performed with the patient in reverse Trendelenburg.  +---------+---------------+---------+-----------+----------+--------------+ RIGHT    CompressibilityPhasicitySpontaneityPropertiesThrombus Aging +---------+---------------+---------+-----------+----------+--------------+ CFV      Full           Yes      Yes                                 +---------+---------------+---------+-----------+----------+--------------+ SFJ      Full                                                        +---------+---------------+---------+-----------+----------+--------------+ FV Prox  Full                                                        +---------+---------------+---------+-----------+----------+--------------+ FV Mid   Full           Yes      Yes                                 +---------+---------------+---------+-----------+----------+--------------+ FV DistalFull                                                        +---------+---------------+---------+-----------+----------+--------------+ PFV      Full                                                        +---------+---------------+---------+-----------+----------+--------------+ POP      Full           Yes      Yes                                 +---------+---------------+---------+-----------+----------+--------------+ PTV      Full                                                        +---------+---------------+---------+-----------+----------+--------------+  PERO     Full                                                         +---------+---------------+---------+-----------+----------+--------------+   +---------+---------------+---------+-----------+----------+--------------+ LEFT     CompressibilityPhasicitySpontaneityPropertiesThrombus Aging +---------+---------------+---------+-----------+----------+--------------+ CFV      Full           Yes      Yes                                 +---------+---------------+---------+-----------+----------+--------------+ SFJ      Full                                                        +---------+---------------+---------+-----------+----------+--------------+ FV Prox  Full                                                        +---------+---------------+---------+-----------+----------+--------------+ FV Mid   Full           Yes      Yes                                 +---------+---------------+---------+-----------+----------+--------------+ FV DistalFull                                                        +---------+---------------+---------+-----------+----------+--------------+ PFV      Full                                                        +---------+---------------+---------+-----------+----------+--------------+ POP      Full           Yes      Yes                                 +---------+---------------+---------+-----------+----------+--------------+ PTV      Full                                                        +---------+---------------+---------+-----------+----------+--------------+ PERO     Full                                                        +---------+---------------+---------+-----------+----------+--------------+  Summary: BILATERAL: - No evidence of deep vein thrombosis seen in the lower extremities, bilaterally. -No evidence of popliteal cyst, bilaterally.   *See table(s) above for measurements and observations. Electronically signed by Debby Robertson on 01/29/2024 at 4:09:03 PM.     Final    ECHOCARDIOGRAM COMPLETE Result Date: 01/29/2024    ECHOCARDIOGRAM REPORT   Patient Name:   Peter Lambert Date of Exam: 01/29/2024 Medical Rec #:  [id number]       Height:       68.0 in Accession #:    [id number]      Weight:       140.7 lb Date of Birth:  [birth year]      BSA:          1.760 m Patient Age:    78 years        BP:           98/66 mmHg Patient Gender: M               HR:           107 bpm. Exam Location:  Inpatient Procedure: 2D Echo, Cardiac Doppler and Color Doppler (Both Spectral and Color            Flow Doppler were utilized during procedure). Indications:    Congestive Heart Failure I50.9  History:        Patient has prior history of Echocardiogram examinations, most                 recent 01/02/2024. CHF, Previous Myocardial Infarction and CAD;                 Risk Factors:Hypertension and Diabetes.  Sonographer:    Jayson Gaskins Referring Phys: [phone] STEPHANIE M REESE IMPRESSIONS  1. EF evaluation limited by quality of evaluation and tachycardia.. Left ventricular ejection fraction, by estimation, is 50 to 55%. The left ventricle has low normal function. The left ventricle demonstrates global hypokinesis. Indeterminate diastolic filling due to E-A fusion.  2. Right ventricular systolic function was not well visualized. The right ventricular size is not well visualized.  3. The mitral valve is degenerative. Mild mitral valve regurgitation.  4. The aortic valve is calcified. Aortic valve regurgitation is not visualized. Comparison(s): Evaluation on this study as well as prior was challenging making it difficult to offer accurate EF. Would recommend repeating limited TTE with contrast. FINDINGS  Left Ventricle: EF evaluation limited by quality of evaluation and tachycardia. Left ventricular ejection fraction, by estimation, is 50 to 55%. The left ventricle has low normal function. The left ventricle demonstrates global hypokinesis. The left ventricular internal cavity size was normal  in size. There is no left ventricular hypertrophy. Indeterminate diastolic filling due to E-A fusion. Right Ventricle: The right ventricular size is not well visualized. Right vetricular wall thickness was not well visualized. Right ventricular systolic function was not well visualized. Left Atrium: Left atrial size was normal in size. Right Atrium: Right atrial size was not well visualized. Pericardium: There is no evidence of pericardial effusion. Mitral Valve: The mitral valve is degenerative in appearance. Mild mitral annular calcification. Mild mitral valve regurgitation. Tricuspid Valve: The tricuspid valve is not well visualized. Tricuspid valve regurgitation is mild. Aortic Valve: The aortic valve is calcified. Aortic valve regurgitation is not visualized. Pulmonic Valve: The pulmonic valve was not well visualized. Pulmonic valve regurgitation is trivial. Aorta: The aortic root is normal in size and structure. IAS/Shunts: No atrial level shunt detected  by color flow Doppler. Additional Comments: A device lead is visualized.  LEFT VENTRICLE PLAX 2D LVIDd:         3.70 cm LVIDs:         3.00 cm LV PW:         1.10 cm LV IVS:        1.00 cm LVOT diam:     2.00 cm LV SV:         34 LV SV Index:   19 LVOT Area:     3.14 cm  LEFT ATRIUM           Index LA Vol (A2C): 25.6 ml 14.55 ml/m LA Vol (A4C): 43.0 ml 24.44 ml/m  AORTIC VALVE LVOT Vmax:   67.90 cm/s LVOT Vmean:  50.100 cm/s LVOT VTI:    0.108 m  AORTA Ao Root diam: 3.40 cm  SHUNTS Systemic VTI:  0.11 m Systemic Diam: 2.00 cm Joelle Cedars Tonleu Electronically signed by Joelle Cedars Tonleu Signature Date/Time: 01/29/2024/2:00:35 PM    Final    CT CHEST ABDOMEN PELVIS WO CONTRAST Result Date: 01/28/2024 CLINICAL DATA:  Unintentional weight loss. Metastatic lung cancer. Started chemotherapy today. Diarrhea. EXAM: CT CHEST, ABDOMEN AND PELVIS WITHOUT CONTRAST TECHNIQUE: Multidetector CT imaging of the chest, abdomen and pelvis was performed following the  standard protocol without IV contrast. RADIATION DOSE REDUCTION: This exam was performed according to the departmental dose-optimization program which includes automated exposure control, adjustment of the mA and/or kV according to patient size and/or use of iterative reconstruction technique. COMPARISON:  PET CT 01/09/2024 FINDINGS: CT CHEST FINDINGS Cardiovascular: Right chest port tip in the SVC. The heart is enlarged. Left-sided pacemaker with lead tips in the right atrium and ventricle. Coronary artery calcifications. Aortic atherosclerosis. The main pulmonary artery is dilated at 3.4 cm. No significant pericardial effusion. Mediastinum/Nodes: Adenopathy assessment is limited in the absence of IV contrast. Subcarinal lymph node is grossly unchanged measuring 18 mm short axis series 2, image 29, previously 19 mm on PET. Multiple small and borderline upper mediastinal nodes. Unremarkable appearance of the esophagus. Lungs/Pleura: Right upper lobe perihilar mass measures 3.8 x 3.1 cm, series 5, image 54 previously 3.5 x 2.9 cm on PET. There is increasing perifissural nodularity in the right major fissure. Severe narrowing of the right upper and middle lobe bronchi. With diffuse bronchial thickening in the right upper lobe. Moderate right pleural effusion is new from prior PET. Mild emphysema with moderate bronchial thickening. Retained secretions within the dependent trachea and left mainstem bronchus. Musculoskeletal: Known osseous metastatic disease with numerous lytic lesions. Expansile lesion involving the right anterior second rib is increased in size currently 4.3 x 2.7 cm, series 2, image 16, previously 4 x 2.2 cm. New central hypodensity suggests necrosis. Posterior right third rib lesion measures 4.5 cm, previously 4.2 cm. Expansile right ninth rib lesion measures 3.8 cm, series 2, image 35, previously 3.1 cm. Spinal lesion involving T9 involve the posterior cortex with epidural extension. Near complete  replacement of T11 vertebral body with extraosseous extension and mass effect on the spinal canal. CT ABDOMEN PELVIS FINDINGS Hepatobiliary: No evidence of focal liver lesion on this unenhanced exam. High-density material within partially distended gallbladder may represent sludge. No pericholecystic inflammation. Pancreas: No ductal dilatation or inflammation. Spleen: No evidence of focal lesion on this unenhanced exam. Splenule noted inferiorly. Adrenals/Urinary Tract: Adrenal thickening without dominant nodule. No hydronephrosis. Renal vascular calcifications without renal calculi. Foley catheter decompresses the urinary bladder. Stomach/Bowel: Gaseous distention of  stomach, small bowel and transverse colon, without significant change from prior PET. No transition point or evidence of obstruction. No convincing bowel inflammation or wall thickening. There is colonic redundancy. Vascular/Lymphatic: Atherosclerosis of needed abdominal aorta. Aorto bifem bypass graft. No discretely enlarged lymph nodes. Reproductive: Enlarged prostate. Other: Left upper quadrant peritoneal nodularity, stable. Representative nodule measures 2.2 x 1.3 cm, series 2, image 58 mild generalized stranding of the intra-abdominal fat. Trace free fluid in the pericolic gutters. Trace free fluid in the pelvis. Unchanged. Musculoskeletal: Again seen diffuse osseous metastatic disease numerous lytic lesions in the spine and pelvis. Expansile left ischial lesion has increased measuring 7 x 6.3 cm, previously 6.7 x 5.5 cm, series 2, image 121. Expansile lesion within the upper sacrum has progressed. IMPRESSION: 1. Metastatic lung cancer with progression in metastatic disease from prior. Primary lesion has increased in size. Many of the expansile lytic bone lesions have increased in size. 2. Moderate right pleural effusion is new from prior exam. 3. Gaseous distention of stomach, small bowel and transverse colon, without significant change from  prior PET. No transition point or evidence of obstruction. Aortic Atherosclerosis (ICD10-I70.0) and Emphysema (ICD10-J43.9). Electronically Signed   By: Andrea Gasman M.D.   On: 01/28/2024 20:51   DG Chest Port 1 View Result Date: 01/28/2024 CLINICAL DATA:  Possible sepsis.  Diarrhea.  Lung carcinoma. EXAM: PORTABLE CHEST 1 VIEW COMPARISON:  01/27/2024 FINDINGS: Stable heart size and appearance of pacing/ICD device. Stable appearance of Port-A-Cath. Stable volume loss and scarring in right lung. No overt pulmonary edema, airspace disease or pneumothorax. No pleural effusion identified. The visualized skeletal structures are unremarkable. IMPRESSION: No acute findings. Stable volume loss and scarring in the right lung. Electronically Signed   By: Marcey Moan M.D.   On: 01/28/2024 18:04     Medications:    Current Medications:  sodium chloride    Intravenous Once   Chlorhexidine  Gluconate Cloth  6 each Topical Daily   collagenase   1 Application Topical Daily   [START ON 01/30/2024] dexamethasone   8 mg Oral Daily   feeding supplement  237 mL Oral TID BM   insulin  aspart  0-9 Units Subcutaneous Q4H   levothyroxine   50 mcg Oral QAC breakfast   pantoprazole   40 mg Oral Daily    Infusions:  DOBUTamine      heparin  800 Units/hr (01/29/24 1149)   lactated ringers  100 mL/hr at 01/29/24 1141   norepinephrine  (LEVOPHED ) Adult infusion 2 mcg/min (01/29/24 1529)    Assessment/Plan   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 Zenaida read - stress-induced cardiomyopathy in the setting of critical illness - continue NE as needed for BP support - can add DBA if need second agent for BP  Afib RVR - rates in 120-130s - would not attempt to lower heart rate; suspect compensatory - continue  heparin    GOC discussed at the bedside, patient's family seems to be in understanding of his critical condition. Currently DNR. Recommend transition to comfort care as this seems to align with patient and families wishes. Heart Failure will sign off at this time.   Length of Stay: 1  CRITICAL CARE Performed by: Sharnee Douglass  Total critical care time: 7 minutes  -Critical care time was exclusive of separately billable procedures and treating other patients. -Critical care was necessary to  treat or prevent imminent or life-threatening deterioration. -Critical care was time spent personally by me on the following activities: development of treatment plan with patient and/or surrogate as well as nursing, discussions with consultants, evaluation of patient's response to treatment, examination of patient, obtaining history from patient or surrogate, ordering and performing treatments and interventions, ordering and review of laboratory studies, ordering and review of radiographic studies, pulse oximetry and re-evaluation of patient's condition.  Taraann Olthoff, NP  01/29/2024, 4:58 PM  Advanced Heart Failure Team Pager [phone] (M-F; 7a - 5p)  Please contact [facility] for night-coverage after hours (4p -7a ) and weekends on [email or web address]

## 2024-01-29 NOTE — Progress Notes (Signed)
 PHARMACY - ANTICOAGULATION CONSULT NOTE  Pharmacy Consult for heparin   Indication: atrial fibrillation  No Known Allergies  Patient Measurements: Height: 5' 7.99 (172.7 cm) Weight: 63.8 kg (140 lb 10.5 oz) IBW/kg (Calculated) : 68.38 HEPARIN  DW (KG): 63.8  Vital Signs: Temp: 97.5 F (36.4 C) (12/03 2045) Temp Source: Core (12/03 2000) BP: 113/60 (12/03 2030) Pulse Rate: 103 (12/03 2045)  Labs: Recent Labs    01/28/24 1705 01/28/24 1923 01/29/24 0012 01/29/24 0300 01/29/24 0701 01/29/24 0937 01/29/24 1526 01/29/24 1747 01/29/24 2013  HGB ACCURACY OF RESULTS QUESTIONABLE. RECOMMEND RECOLLECT TO VERIFY. 7.4* 9.2*  --  10.0*  --   --   --   --   HCT ACCURACY OF RESULTS QUESTIONABLE. RECOMMEND RECOLLECT TO VERIFY. 23.9* 27.0*  --  30.6*  --   --   --   --   PLT ACCURACY OF RESULTS QUESTIONABLE. RECOMMEND RECOLLECT TO VERIFY. 122*  --   --  72*  --   --   --   --   APTT  --   --   --   --   --   --   --   --  47*  HEPARINUNFRC  --   --   --   --   --   --   --   --  >1.10*  CREATININE 1.95*  --   --  2.48*  --   --  2.68* 2.73*  --   CKTOTAL  --   --   --   --   --  90  --   --   --   CKMB  --   --   --   --   --  9.0*  --   --   --     Estimated Creatinine Clearance: 19.5 mL/min (A) (by C-G formula based on SCr of 2.73 mg/dL (H)).   Medical History: Past Medical History:  Diagnosis Date   Acute upper GI bleed    Anemia    Anticoagulated on apixaban     Cardiac arrest with ventricular fibrillation (HCC) 2021   CHF (congestive heart failure) (HCC)    Chronic a-fib (HCC)    Coronary artery disease    a. s/p NSTEMI in 2010 with DES to proximal LAD with D1 jailed and angioplasty alone to ostium   Dyspnea    GERD (gastroesophageal reflux disease)    Hyperlipidemia    Hypertension    Mediastinal lymphadenopathy    NSTEMI (non-ST elevated myocardial infarction) (HCC) 2010   Peripheral arterial disease    S/P coronary artery stent placement    S/P ICD (internal  cardiac defibrillator) procedure    Squamous cell carcinoma of lung, stage IV, right (HCC)    Diagnosed 2025, metastatic to bone/spine   Tobacco abuse    Type 2 diabetes mellitus (HCC)     Medications:  Medications Prior to Admission  Medication Sig Dispense Refill Last Dose/Taking   acetaminophen  (TYLENOL ) 500 MG tablet Take 2 tablets (1,000 mg total) by mouth 3 (three) times daily. (Patient taking differently: Take 1,000 mg by mouth every 8 (eight) hours as needed for mild pain (pain score 1-3).)   Past Week   albuterol  (VENTOLIN  HFA) 108 (90 Base) MCG/ACT inhaler Inhale 2 puffs into the lungs every 6 (six) hours as needed for wheezing or shortness of breath. 8 g 2 Unknown   apixaban  (ELIQUIS ) 5 MG TABS tablet Take 1 tablet (5 mg total) by mouth 2 (two) times daily.  Hold Eliquis  on 01/05/2024 for bronchoscopy 60 tablet 5 01/28/2024 at  7:30 AM   colchicine  0.6 MG tablet Take 1 tablet (0.6 mg total) by mouth 2 (two) times daily. 14 tablet 0 01/27/2024 Evening   [employer] GENTLE LAXATIVE 5 MG EC tablet TAKE 1 TABLET BY MOUTH DAILY AS NEEDED FOR MODERATE CONSTIPATION. TAKE TWO DAYS BEFORE PROCEDURE (Patient taking differently: Take 5 mg by mouth daily as needed for mild constipation or moderate constipation.) 30 tablet 0 Unknown   dexamethasone  (DECADRON ) 4 MG tablet Take 2 tablets (8mg ) by mouth daily starting the day after carboplatin for 3 days. Take with food 30 tablet 1 Unknown   feeding supplement (ENSURE PLUS HIGH PROTEIN) LIQD Take 237 mLs by mouth 3 (three) times daily between meals.   01/27/2024 Evening   [Paused] furosemide  (LASIX ) 20 MG tablet Take 20 mg by mouth daily.   01/27/2024 Morning   levothyroxine  (SYNTHROID ) 50 MCG tablet Take 50 mcg by mouth daily before breakfast.   01/27/2024 Morning   lidocaine -prilocaine (EMLA) cream Apply 1 Application topically as needed (Apply 30 minutes to 1 hour prior to port access.). 30 g 3 Unknown   Multiple Vitamin (MULTIVITAMIN WITH MINERALS) TABS tablet  Take 1 tablet by mouth daily.   Unknown   nitroGLYCERIN  (NITROSTAT ) 0.4 MG SL tablet Place 1 tablet (0.4 mg total) under the tongue every 5 (five) minutes as needed for chest pain. Patient must keep upcoming appointment for further refills 25 tablet 12 Past Month   ondansetron  (ZOFRAN ) 8 MG tablet Take 1 tablet (8 mg total) by mouth every 8 (eight) hours as needed for nausea or vomiting. Start on the third day after carboplatin. 30 tablet 1 Unknown   oxyCODONE  (OXY IR/ROXICODONE ) 5 MG immediate release tablet Take 1-2 tablets (5-10 mg total) by mouth every 4 (four) hours as needed for moderate pain (pain score 4-6), severe pain (pain score 7-10) or breakthrough pain. 50 tablet 0 Past Week   polyethylene glycol powder (MIRALAX ) 17 GM/SCOOP powder Take 17 g by mouth daily as needed for moderate constipation or mild constipation. Dissolve 1 capful (17g) in 4-8 ounces of liquid and take by mouth daily. 238 g 0 Unknown   [Paused] potassium chloride  SA (KLOR-CON ) 20 MEQ tablet Take 20 mEq by mouth daily after lunch.   01/27/2024 Morning   prochlorperazine  (COMPAZINE ) 10 MG tablet Take 1 tablet (10 mg total) by mouth every 6 (six) hours as needed for nausea or vomiting. 30 tablet 1 Unknown   senna-docusate (SENOKOT-S) 8.6-50 MG tablet Take 1 tablet by mouth at bedtime. 30 tablet 0 Unknown   omeprazole  (PRILOSEC) 20 MG capsule Take 20 mg by mouth daily after lunch.   Unknown   Scheduled:   sodium chloride    Intravenous Once   Chlorhexidine  Gluconate Cloth  6 each Topical Daily   collagenase   1 Application Topical Daily   [START ON 01/30/2024] dexamethasone   8 mg Oral Daily   feeding supplement  237 mL Oral TID BM   insulin  aspart  0-9 Units Subcutaneous Q4H   levothyroxine   50 mcg Oral QAC breakfast   pantoprazole   40 mg Oral Daily   Infusions:   DOBUTamine 5 mcg/kg/min (01/29/24 2000)   heparin  800 Units/hr (01/29/24 2000)   lactated ringers  100 mL/hr at 01/29/24 2000   norepinephrine  (LEVOPHED ) Adult  infusion 2 mcg/min (01/29/24 2000)    Assessment: Patient admitted with shock after receiving chemotherapy on 12/2. Receives Eliquis  at home for atrial fibrillation (last dose 12/2 AM).  Pharmacy consulted to dose heparin .   PM update: aPTT 47 sec is subtherapeutic on heparin  800 units/hr. Heparin  level > 1.1 as expected given recent DOAC exposure. RN reports frequent pauses with heparin  infusion, new IV site established.  Goal of Therapy:  Heparin  level 0.3-0.7 units/ml aPTT 66-102 seconds Monitor platelets by anticoagulation protocol: Yes   Plan:  Increase heparin  infusion conservatively to 900 units/hr Check anti-Xa level / aPTT in 8 hours and daily while on heparin  Continue to monitor H&H and platelets  Rocky Slade, PharmD, BCPS 01/29/2024,9:39 PM  Please check AMION for all [facility] phone numbers After 10:00 PM, call Main Pharmacy [phone]

## 2024-01-29 NOTE — Progress Notes (Signed)
 [facility] CSW Progress Note    Interventions: CSW received a request to contact pt's sister regarding challenges at home.  Pt's sister requesting assistance to place pt in a SNF as he is unable to care for himself at home.  Following request pt was transferred to the ED at [facility] and then later to [facility] and admitted to the ICU.  Inpatient team to facilitate appropriate discharge planning.         Follow Up Plan:  Patient will contact CSW with any support or resource needs    Devere JONELLE Manna, LCSW Clinical Social Worker [facility]

## 2024-01-29 NOTE — Plan of Care (Signed)

## 2024-01-29 NOTE — TOC CM/SW Note (Addendum)
 Transition of Care [facility]) - Inpatient Brief Assessment   Patient Details  Name: Peter Lambert MRN: [id number] Date of Birth: [birth year]  [facility]) CM/SW Contact:    Peter Lambert, Peter Muskrat, RN Phone Number: 01/29/2024, 1:37 PM   Clinical Narrative:  Patient transferred from [facility] with Generalized Weakness and Hypotension. Hgb 7.4, 1U PRBC given.  CT Chest/Abdomen/Pelvis showed Metastatic Lung CA with progression of Metastatic disease, increased size of primary Lesion and increased size of Lytic Bone Lesions; new moderate Rt Pleural Effusion. Admitted with Shock. Patient has hx of NSTEMI-PCI to LAD, VF arrest s/p ICD, A-fib on Eliquis , HFpEF, PAD s/p Aortofem bypass, COPD, Metastatic non-small cell Lung CA with mets to the bone/spine. Outpatient Palliative, Chemo and Radiation. Oncology following.  On Heparin  gtt, completed IV abx dose. Currently on 2L O2 baseline, uses 2L O2 as needed at home.   CM spoke with patient's son Peter Lambert at bedside, patient sleepy during this assessment. Peter Lambert states patient lives alone, independent until a month ago then starts to decline. Patient has three children, three supportive siblings Has a cane, walker and wheelchair at home. Receives home O2 supplies from [employer].  Patient is active with Home Health PT/RN/Aide with Peter Lambert requesting SNF at this time as patient is no longer able to care for self at home.  PCP is Peter Rush, MD and uses [facility] [address] in [place].  Patient not Medically ready for discharge.  CM will continue to follow as patient progresses with care towards discharge.         Transition of Care Asessment: Insurance and Status: Insurance coverage has been reviewed Patient has primary care physician: Yes Home environment has been reviewed: Yes Prior level of function:: ives alone, needs assistance Prior/Current Home Services: Current home services Recruitment Consultant) Social Drivers of Health  Review: SDOH reviewed no interventions necessary Readmission risk has been reviewed: Yes Transition of care needs: transition of care needs identified, TOC will continue to follow

## 2024-01-29 NOTE — Progress Notes (Signed)
 Venous duplex lower ext  has been completed. Refer to [facility] under chart review to view preliminary results.   01/29/2024  3:33 PM Melani Brisbane, Ricka BIRCH

## 2024-01-29 NOTE — Progress Notes (Signed)
 NAME:  Peter Lambert, MRN:  [id number], DOB:  [birth year], LOS: 1 ADMISSION DATE:  01/28/2024 CONSULTATION DATE:  01/29/2024 REFERRING MD:  Melvenia - EDP ([facility]), CHIEF COMPLAINT:  Hypotension   History of Present Illness:  78 year old man who presented to [facility] 12/2 as a transfer from [facility] for weakness and hypotension. PMHx significant for HTN, HLD, CAD with NSTEMI (PCI with DES to proximal LAD 2010), VF cardiac arrest s/p ICD implantation (2021 s/p occluded RCA), Afib (on Eliquis ), HFpEF (Echo 12/2023 with EF 60-65%), PAD (s/p aortofemoral bypass grafting), COPD, metastatic non-small cell lung CA (RUL SCC with mets to bone/spine, diagnosed 2025, on chemo/XRT), tobacco abuse, T2DM, hypothyroidism, UGIB, GERD, gout. Recent admission 11/17-11/23 for weakness, general functional decline. Recent presentation to ED 11/29 for R lower leg pain, thought to be gout-related pain and discharged home.  Patient received first chemotherapy infusion at [facility] 12/2AM; during treatment he was noted to be tachycardic, weak and incontinent of stool with minimal UOP despite fluid 1.5L fluid bolus. Sacral wound was also noted. Patient was transferred to [facility] ED for further evaluation. On ED arrival, patient was afebrile with HR 110, BP 109/73, RR 20, SpO2 96% on 2LNC. Labs were notable for WBC 5.0, Hgb 7.4 (baseline 8.5-10), Plt 122. Na 137, K 6.0, CO2 12, BUN 69/Cr 1.95 (baseline 1.1-1.2). AST/ALT 1630/827, Alk Phos 161, Tbili 2.0. LA 8.9 > 9.0. Trop 95 > 92 (suspect demand-related), BNP 11,858. UA grossly unremarkable. C. Diff negative, COVID/Flu/RSV negative. BCx pending. CXR negative for acute findings. CT Chest/A/P demonstrated metastatic lung CA with progression of metastatic disease, increased size of primary lesion and increased size of lytic bone lesions; new moderate R pleural effusion.   While in ED, patient became hypotensive to SBP 80s-90s requiring Levophed  initiation. 1U PRBCs was ordered for symptomatic  anemia. Decision to transfer patient to [facility] for admission. PCCM consulted for ICU admission.  Pertinent Medical History:   Past Medical History:  Diagnosis Date   Acute upper GI bleed    Anemia    Anticoagulated on apixaban     Cardiac arrest with ventricular fibrillation (HCC) 2021   CHF (congestive heart failure) (HCC)    Chronic a-fib (HCC)    Coronary artery disease    a. s/p NSTEMI in 2010 with DES to proximal LAD with D1 jailed and angioplasty alone to ostium   Dyspnea    GERD (gastroesophageal reflux disease)    Hyperlipidemia    Hypertension    Mediastinal lymphadenopathy    NSTEMI (non-ST elevated myocardial infarction) (HCC) 2010   Peripheral arterial disease    S/P coronary artery stent placement    S/P ICD (internal cardiac defibrillator) procedure    Squamous cell carcinoma of lung, stage IV, right (HCC)    Diagnosed 2025, metastatic to bone/spine   Tobacco abuse    Type 2 diabetes mellitus (HCC)    Significant Hospital Events: Including procedures, antibiotic start and stop dates in addition to other pertinent events   12/2 - Presented to [facility] ED after weakness, decreased UOP, multiple episodes of diarrhea while receiving first chemotherapy infusion. Labs with anemia, AKI, ALI. CT Chest/A/P with concern for worsening primary lesion/progression of metastatic disease. Hypotensive in ED requiring Levo initiation/1U PRBCs. Transferred to [facility] for admission. PCCM consulted.  Interim History / Subjective:  In ICU, no drips. 2L [place]. No complaints. Resting comfortably.   Objective:  Blood pressure 113/83, pulse (!) 113, temperature (!) 97.2 F (36.2 C), resp. rate 15, height 5' 7.99 (  1.727 m), weight 63.8 kg, SpO2 (!) 87%.        Intake/Output Summary (Last 24 hours) at 01/29/2024 0843 Last data filed at 01/29/2024 0600 Gross per 24 hour  Intake 1349.2 ml  Output 300 ml  Net 1049.2 ml   Filed Weights   01/28/24 2345 01/29/24 0411  Weight: 63.8 kg 63.8 kg    Physical Examination: General:  frail elderly male in NAD Neuro:  Somnolent, eyes to verbal. Answers simple questions briefly.  HEENT:  /AT, No JVD noted, PERRL Cardiovascular:  Tachy, regular, no MRG Lungs:  Diminished on the right.  Abdomen:  Soft, non-distended, non-tender.  Musculoskeletal:  No acute deformity Skin:  Sacral decub POA see image below.        Resolved Hospital Problem List:    Assessment & Plan:   Undifferentiated shock: Hypovolemic? (BP did respond to volume/blood, but other indicators of global perfusion worsening). proBNP is 11000, mild trop leak. ? Cardiogenic. EF looked reasonable on POCUS, but formal echo pending. Also will attempt to rule out PE with cancer history and new oxygen  requirement.  - Keep in ICU - Goal MAP > 65 - Defer additional volume for now - Check coox from his port - Doppler lower extremities - Echo pending - Trend H&H, f/u post-transfusion CBC/labs - Monitor for signs of active bleeding - Transfuse for Hgb < 7.0 or hemodynamically significant bleeding  AKI, -worsening Hyperkalemia Hypocalcemia AGMA- improved Lactic acidosis - improving - Lokelma  - Bicarb gtt expired, hold further for now - Monitor I&Os - Trend chemistries  Acute liver injury - worsening Query hypoperfusion versus related to paclitaxel - CT without focal abnormalities, consider RUQ US  if not improving - Trend LFT.   Metastatic non-small cell lung CA; RUL SCC with mets to bone/spine, diagnosed 2025, tx with palliative chemotherapy and palliative radiation.  Cancer-related pain COPD Tobacco abuse Moderate R pleural effusion, new S/p initial chemotherapy session 12/2. - Oncology consult - Bronchodilators PRN - Continue Decadron  x 3 days post radiation - Will consider draining his effusion - Follow CXR  CAD with NSTEMI (PCI with DES to proximal LAD 2010) VF cardiac arrest s/p ICD implantation (2021 s/p occluded RCA) HFpEF (Echo 12/2023 with EF  60-65%) Afib (on Eliquis ) HTN HLD PAD (s/p aortofemoral bypass grafting) CHA2DS2-VASc Score 6 (age, CHF, HTN, prior MI, DM). HAS-BLED Score 2 (HTN, prior UGIB). - Cardiac monitoring - Holding Eliquis , consider starting heparin  if not procedures today.  - Hold Lasix  - Echo pending  T2DM - SSI - CBGs Q4H - Goal CBG 140-180  Hypothyroidism - Continue home levothyroxine   GERD - Continue PPI  Gout - Hold colchicine  for now  Sacral wound, POA - WOC consult  Critical care time: 43 minutes   The patient is critically ill with multiple organ system failure and requires high complexity decision making for assessment and support, frequent evaluation and titration of therapies, advanced monitoring, review of radiographic studies and interpretation of complex data.    Deward Eastern, AGACNP-BC [facility]  See Amion for personal pager PCCM on call pager [phone] until 7pm. Please call Elink 7p-7a. [phone]  01/29/2024 9:16 AM

## 2024-01-29 NOTE — Telephone Encounter (Signed)
 Oral Oncology Patient Advocate Encounter  Prior Authorization for lidocaine -prilocaine  (EMLA ) cream has been approved.    PA# [id number] Effective dates: 01/29/2024 through 04/28/2024   Barbette (Patty) Chet Burnet, CPhT  [facility], [place], [place] Hematology/Oncology - Oral Chemotherapy Patient Advocate Specialist III Phone: [phone]  Fax: [phone]

## 2024-01-29 NOTE — Telephone Encounter (Signed)
 Oral Oncology Patient Advocate Encounter   New authorization   Received notification that prior authorization for lidocaine -prilocaine (EMLA) cream is required.   PA submitted on CMM via Latent Key [id number] Status is pending     Peter Lambert (Patty) Chet Burnet, CPhT  [facility], [place], [place] Hematology/Oncology - Oral Chemotherapy Patient Advocate Specialist III Phone: [phone]  Fax: [phone]

## 2024-01-29 NOTE — Progress Notes (Signed)
 eLink Physician-Brief Progress Note Patient Name: Peter Lambert DOB: [birth year] MRN: [id number]   Date of Service  01/29/2024  HPI/Events of Note  stress-induced cardiomyopathy in the setting of critical illness  starting to tach up to the 140's and then come right back down   eICU Interventions  Chart reviewed, HR trend is the same as before, per HF, avoid treating HR as it's likely compensatory. No intervention   0039 -concerned that the patient wants to go home above all else.  Hemodynamics have been unchanged but still requiring substantial support.  Even if we made a transition to comfort, unlikely would be able to coordinate home hospice services tonight.  Family wishes to continue current medical care for the time being with ongoing goals of care discussions.  Will defer to the multidisciplinary conversation to readdress ongoing goals of care-unlikely to change clinical outcome tonight.  9853 - restless and taking off his oxygen  and has not slept tonight; Adding olanzapine  0641 - platelets 29, hold heparin  gtt  Intervention Category Intermediate Interventions: Arrhythmia - evaluation and management  Tyrice Hewitt 01/29/2024, 10:18 PM

## 2024-01-29 NOTE — IPAL (Signed)
  Interdisciplinary Goals of Care Family Meeting   Date carried out: 01/29/2024  Location of the meeting: Unit  Member's involved: Nurse Practitioner, Bedside Registered Nurse, and Family Member or next of kin  Durable Power of Attorney or acting medical decision maker: All three sons have been present in the discussions and decision making process including both powers of attorney     Discussion: We discussed goals of care for Peter Lambert .  They understand he has incurable stage 4 cancer and had been declining. Now he has developed tumor lysis syndrome, shock, liver failure, and kidney failure. We discussed in detail what this means for his prognosis. In the past he has wanted to be aggressive and treat the treatable, but they understand this is most likely non-survivable. They would like to continue medical management with hopes he can improve, but if he were to worsen they would not want aggressive measures such as intubation. DNR/DNI  Code status:   Code Status: Limited: Do not attempt resuscitation (DNR) -DNR-LIMITED -Do Not Intubate/DNI    Disposition: Continue current acute care  Time spent for the meeting: 31 minutes    Deward Eastern, AGACNP-BC [facility]  See Amion for personal pager PCCM on call pager [phone] until 7pm. Please call Elink 7p-7a. [phone]  01/29/2024 4:42 PM

## 2024-01-29 NOTE — Progress Notes (Signed)
 PHARMACY - ANTICOAGULATION CONSULT NOTE  Pharmacy Consult for heparin   Indication: atrial fibrillation  No Known Allergies  Patient Measurements: Height: 5' 7.99 (172.7 cm) Weight: 63.8 kg (140 lb 10.5 oz) IBW/kg (Calculated) : 68.38 HEPARIN  DW (KG): 63.8  Vital Signs: Temp: 97.2 F (36.2 C) (12/03 0645) Temp Source: Bladder (12/03 0600) BP: 113/83 (12/03 0630) Pulse Rate: 113 (12/03 0645)  Labs: Recent Labs    01/28/24 1705 01/28/24 1923 01/29/24 0012 01/29/24 0300 01/29/24 0701 01/29/24 0937  HGB ACCURACY OF RESULTS QUESTIONABLE. RECOMMEND RECOLLECT TO VERIFY. 7.4* 9.2*  --  10.0*  --   HCT ACCURACY OF RESULTS QUESTIONABLE. RECOMMEND RECOLLECT TO VERIFY. 23.9* 27.0*  --  30.6*  --   PLT ACCURACY OF RESULTS QUESTIONABLE. RECOMMEND RECOLLECT TO VERIFY. 122*  --   --  72*  --   CREATININE 1.95*  --   --  2.48*  --   --   CKTOTAL  --   --   --   --   --  90  CKMB  --   --   --   --   --  9.0*    Estimated Creatinine Clearance: 21.4 mL/min (A) (by C-G formula based on SCr of 2.48 mg/dL (H)).   Medical History: Past Medical History:  Diagnosis Date   Acute upper GI bleed    Anemia    Anticoagulated on apixaban     Cardiac arrest with ventricular fibrillation (HCC) 2021   CHF (congestive heart failure) (HCC)    Chronic a-fib (HCC)    Coronary artery disease    a. s/p NSTEMI in 2010 with DES to proximal LAD with D1 jailed and angioplasty alone to ostium   Dyspnea    GERD (gastroesophageal reflux disease)    Hyperlipidemia    Hypertension    Mediastinal lymphadenopathy    NSTEMI (non-ST elevated myocardial infarction) (HCC) 2010   Peripheral arterial disease    S/P coronary artery stent placement    S/P ICD (internal cardiac defibrillator) procedure    Squamous cell carcinoma of lung, stage IV, right (HCC)    Diagnosed 2025, metastatic to bone/spine   Tobacco abuse    Type 2 diabetes mellitus (HCC)     Medications:  Medications Prior to Admission   Medication Sig Dispense Refill Last Dose/Taking   acetaminophen  (TYLENOL ) 500 MG tablet Take 2 tablets (1,000 mg total) by mouth 3 (three) times daily. (Patient taking differently: Take 1,000 mg by mouth every 8 (eight) hours as needed for mild pain (pain score 1-3).)   Past Week   albuterol  (VENTOLIN  HFA) 108 (90 Base) MCG/ACT inhaler Inhale 2 puffs into the lungs every 6 (six) hours as needed for wheezing or shortness of breath. 8 g 2 Unknown   apixaban  (ELIQUIS ) 5 MG TABS tablet Take 1 tablet (5 mg total) by mouth 2 (two) times daily. Hold Eliquis  on 01/05/2024 for bronchoscopy 60 tablet 5 01/28/2024 at  7:30 AM   colchicine  0.6 MG tablet Take 1 tablet (0.6 mg total) by mouth 2 (two) times daily. 14 tablet 0 01/27/2024 Evening   [employer] GENTLE LAXATIVE 5 MG EC tablet TAKE 1 TABLET BY MOUTH DAILY AS NEEDED FOR MODERATE CONSTIPATION. TAKE TWO DAYS BEFORE PROCEDURE (Patient taking differently: Take 5 mg by mouth daily as needed for mild constipation or moderate constipation.) 30 tablet 0 Unknown   dexamethasone  (DECADRON ) 4 MG tablet Take 2 tablets (8mg ) by mouth daily starting the day after carboplatin for 3 days. Take with food 30 tablet  1 Unknown   feeding supplement (ENSURE PLUS HIGH PROTEIN) LIQD Take 237 mLs by mouth 3 (three) times daily between meals.   01/27/2024 Evening   [Paused] furosemide  (LASIX ) 20 MG tablet Take 20 mg by mouth daily.   01/27/2024 Morning   levothyroxine  (SYNTHROID ) 50 MCG tablet Take 50 mcg by mouth daily before breakfast.   01/27/2024 Morning   lidocaine -prilocaine  (EMLA ) cream Apply 1 Application topically as needed (Apply 30 minutes to 1 hour prior to port access.). 30 g 3 Unknown   Multiple Vitamin (MULTIVITAMIN WITH MINERALS) TABS tablet Take 1 tablet by mouth daily.   Unknown   nitroGLYCERIN  (NITROSTAT ) 0.4 MG SL tablet Place 1 tablet (0.4 mg total) under the tongue every 5 (five) minutes as needed for chest pain. Patient must keep upcoming appointment for further refills 25  tablet 12 Past Month   ondansetron  (ZOFRAN ) 8 MG tablet Take 1 tablet (8 mg total) by mouth every 8 (eight) hours as needed for nausea or vomiting. Start on the third day after carboplatin . 30 tablet 1 Unknown   oxyCODONE  (OXY IR/ROXICODONE ) 5 MG immediate release tablet Take 1-2 tablets (5-10 mg total) by mouth every 4 (four) hours as needed for moderate pain (pain score 4-6), severe pain (pain score 7-10) or breakthrough pain. 50 tablet 0 Past Week   polyethylene glycol powder (MIRALAX ) 17 GM/SCOOP powder Take 17 g by mouth daily as needed for moderate constipation or mild constipation. Dissolve 1 capful (17g) in 4-8 ounces of liquid and take by mouth daily. 238 g 0 Unknown   [Paused] potassium chloride  SA (KLOR-CON ) 20 MEQ tablet Take 20 mEq by mouth daily after lunch.   01/27/2024 Morning   prochlorperazine  (COMPAZINE ) 10 MG tablet Take 1 tablet (10 mg total) by mouth every 6 (six) hours as needed for nausea or vomiting. 30 tablet 1 Unknown   senna-docusate (SENOKOT-S) 8.6-50 MG tablet Take 1 tablet by mouth at bedtime. 30 tablet 0 Unknown   omeprazole  (PRILOSEC) 20 MG capsule Take 20 mg by mouth daily after lunch.   Unknown   Scheduled:   sodium chloride    Intravenous Once   Chlorhexidine  Gluconate Cloth  6 each Topical Daily   collagenase   1 Application Topical Daily   dexamethasone   8 mg Oral Daily   feeding supplement  237 mL Oral TID BM   insulin  aspart  0-9 Units Subcutaneous Q4H   levothyroxine   50 mcg Oral QAC breakfast   pantoprazole   40 mg Oral Daily   Infusions:   lactated ringers      norepinephrine  (LEVOPHED ) Adult infusion Stopped (01/28/24 2355)   rasburicase  (ELITEK ) 6 mg in sodium chloride  0.9 % 46 mL IVPB      Assessment: Patient admitted with shock after receiving chemotherapy on 12/2. Receives Eliquis  at home for atrial fibrillation (last dose 12/2 AM). Pharmacy consulted to dose heparin .   Goal of Therapy:  Heparin  level 0.3-0.7 units/ml aPTT 66-102  seconds Monitor platelets by anticoagulation protocol: Yes   Plan:  Start heparin  infusion at 800 units/hr Check anti-Xa level / aPTT in 8 hours and daily while on heparin  Continue to monitor H&H and platelets  Peter Lambert 01/29/2024,10:46 AM

## 2024-01-30 ENCOUNTER — Ambulatory Visit

## 2024-01-30 ENCOUNTER — Inpatient Hospital Stay

## 2024-01-30 LAB — URIC ACID: Uric Acid, Serum: <1.5 mg/dL — ABNORMAL LOW (ref 3.7–8.6)

## 2024-01-30 LAB — BASIC METABOLIC PANEL WITH GFR
Anion gap: 10 (ref 5–15)
Anion gap: 12 (ref 5–15)
Anion gap: 10 (ref 5–15)
BUN: 99 mg/dL — ABNORMAL HIGH (ref 8–23)
BUN: 100 mg/dL — ABNORMAL HIGH (ref 8–23)
BUN: 100 mg/dL — ABNORMAL HIGH (ref 8–23)
CO2: 23 mmol/L (ref 22–32)
CO2: 23 mmol/L (ref 22–32)
CO2: 23 mmol/L (ref 22–32)
Calcium: 6.7 mg/dL — ABNORMAL LOW (ref 8.9–10.3)
Calcium: 6.7 mg/dL — ABNORMAL LOW (ref 8.9–10.3)
Calcium: 6.4 mg/dL — CL (ref 8.9–10.3)
Chloride: 103 mmol/L (ref 98–111)
Chloride: 102 mmol/L (ref 98–111)
Chloride: 103 mmol/L (ref 98–111)
Creatinine, Ser: 2.81 mg/dL — ABNORMAL HIGH (ref 0.61–1.24)
Creatinine, Ser: 2.75 mg/dL — ABNORMAL HIGH (ref 0.61–1.24)
Creatinine, Ser: 2.8 mg/dL — ABNORMAL HIGH (ref 0.61–1.24)
GFR, Estimated: 22 mL/min — ABNORMAL LOW (ref >60)
GFR, Estimated: 23 mL/min — ABNORMAL LOW (ref >60)
GFR, Estimated: 22 mL/min — ABNORMAL LOW (ref >60)
Glucose, Bld: 127 mg/dL — ABNORMAL HIGH (ref 70–99)
Glucose, Bld: 120 mg/dL — ABNORMAL HIGH (ref 70–99)
Glucose, Bld: 146 mg/dL — ABNORMAL HIGH (ref 70–99)
Potassium: 4.7 mmol/L (ref 3.5–5.1)
Potassium: 4.9 mmol/L (ref 3.5–5.1)
Potassium: 4.7 mmol/L (ref 3.5–5.1)
Sodium: 136 mmol/L (ref 135–145)
Sodium: 137 mmol/L (ref 135–145)
Sodium: 136 mmol/L (ref 135–145)

## 2024-01-30 LAB — PHOSPHORUS
Phosphorus: 6.4 mg/dL — ABNORMAL HIGH (ref 2.5–4.6)
Phosphorus: 6.3 mg/dL — ABNORMAL HIGH (ref 2.5–4.6)

## 2024-01-30 LAB — COOXEMETRY PANEL
Carboxyhemoglobin: 1.7 % — ABNORMAL HIGH (ref 0.5–1.5)
Methemoglobin: <0.7 % (ref 0.0–1.5)
O2 Saturation: 55.4 %
Total hemoglobin: 9.2 g/dL — ABNORMAL LOW (ref 12.0–16.0)

## 2024-01-30 LAB — CBC
HCT: 26.6 % — ABNORMAL LOW (ref 39.0–52.0)
Hemoglobin: 8.9 g/dL — ABNORMAL LOW (ref 13.0–17.0)
MCH: 25.9 pg — ABNORMAL LOW (ref 26.0–34.0)
MCHC: 33.5 g/dL (ref 30.0–36.0)
MCV: 77.6 fL — ABNORMAL LOW (ref 80.0–100.0)
Platelets: 29 K/uL — CL (ref 150–400)
RBC: 3.43 MIL/uL — ABNORMAL LOW (ref 4.22–5.81)
RDW: 16.9 % — ABNORMAL HIGH (ref 11.5–15.5)
WBC: 1.9 K/uL — ABNORMAL LOW (ref 4.0–10.5)
nRBC: 1 % — ABNORMAL HIGH (ref 0.0–0.2)

## 2024-01-30 LAB — APTT: aPTT: 71 s — ABNORMAL HIGH (ref 24–36)

## 2024-01-30 LAB — LACTATE DEHYDROGENASE
LDH: 548 U/L — ABNORMAL HIGH (ref 105–235)
LDH: 490 U/L — ABNORMAL HIGH (ref 105–235)

## 2024-01-30 LAB — GLUCOSE, CAPILLARY
Glucose-Capillary: 118 mg/dL — ABNORMAL HIGH (ref 70–99)
Glucose-Capillary: 125 mg/dL — ABNORMAL HIGH (ref 70–99)
Glucose-Capillary: 117 mg/dL — ABNORMAL HIGH (ref 70–99)
Glucose-Capillary: 138 mg/dL — ABNORMAL HIGH (ref 70–99)

## 2024-01-30 LAB — MAGNESIUM
Magnesium: 2.1 mg/dL (ref 1.7–2.4)
Magnesium: 2.2 mg/dL (ref 1.7–2.4)
Magnesium: 2.2 mg/dL (ref 1.7–2.4)

## 2024-01-30 LAB — HEPARIN LEVEL (UNFRACTIONATED): Heparin Unfractionated: 1.07 [IU]/mL — ABNORMAL HIGH (ref 0.30–0.70)

## 2024-01-30 LAB — LACTIC ACID, PLASMA: Lactic Acid, Venous: 1.4 mmol/L (ref 0.5–1.9)

## 2024-01-30 MED ORDER — HYDROMORPHONE HCL 1 MG/ML IJ SOLN
0.5000 mg | INTRAMUSCULAR | Status: DC | PRN
  Administered 2024-01-30: 0.5 mg via INTRAVENOUS
  Filled 2024-01-30: qty 0.5

## 2024-01-30 MED ORDER — GLYCOPYRROLATE 0.2 MG/ML IJ SOLN
0.2000 mg | INTRAMUSCULAR | Status: DC | PRN
  Administered 2024-01-30: 0.2 mg via INTRAVENOUS
  Filled 2024-01-30: qty 1

## 2024-01-30 MED ORDER — HYDROMORPHONE HCL 1 MG/ML IJ SOLN: 0.5000 mg | INTRAMUSCULAR | Status: DC | PRN

## 2024-01-30 MED ORDER — STERILE WATER FOR INJECTION IJ SOLN
INTRAMUSCULAR | Status: AC
  Filled 2024-01-30: qty 10

## 2024-01-30 MED ORDER — LORAZEPAM 2 MG/ML PO CONC: 1.0000 mg | ORAL | Status: DC | PRN

## 2024-01-30 MED ORDER — LORAZEPAM 2 MG/ML IJ SOLN
1.0000 mg | INTRAMUSCULAR | Status: DC | PRN
  Filled 2024-01-30: qty 1

## 2024-01-30 MED ORDER — OLANZAPINE 10 MG IM SOLR
2.5000 mg | INTRAMUSCULAR | Status: DC | PRN
  Administered 2024-01-30: 2.5 mg via INTRAVENOUS
  Filled 2024-01-30 (×2): qty 10

## 2024-01-30 MED ORDER — OXYCODONE HCL 5 MG PO TABS: 5.0000 mg | ORAL_TABLET | ORAL | Status: DC | PRN

## 2024-01-30 MED ORDER — GLYCOPYRROLATE 0.2 MG/ML IJ SOLN: 0.2000 mg | INTRAMUSCULAR | Status: DC | PRN

## 2024-01-30 MED ORDER — LORAZEPAM 1 MG PO TABS: 1.0000 mg | ORAL_TABLET | ORAL | Status: DC | PRN

## 2024-01-30 MED ORDER — GLYCOPYRROLATE 1 MG PO TABS: 1.0000 mg | ORAL_TABLET | ORAL | Status: DC | PRN

## 2024-01-30 MED ORDER — BIOTENE DRY MOUTH MT LIQD
15.0000 mL | Freq: Three times a day (TID) | OROMUCOSAL | Status: DC
  Administered 2024-01-30: 15 mL via TOPICAL

## 2024-01-30 MED ORDER — POLYVINYL ALCOHOL 1.4 % OP SOLN: 1.0000 [drp] | Freq: Four times a day (QID) | OPHTHALMIC | Status: DC | PRN

## 2024-01-30 NOTE — Progress Notes (Addendum)
 NAME:  Peter Lambert, MRN:  [id number], DOB:  [birth year], LOS: 2 ADMISSION DATE:  01/28/2024 CONSULTATION DATE:  01/29/2024 REFERRING MD:  Melvenia - EDP ([facility]), CHIEF COMPLAINT:  Hypotension   History of Present Illness:  78 year old man who presented to [facility] 12/2 as a transfer from [facility] for weakness and hypotension. PMHx significant for HTN, HLD, CAD with NSTEMI (PCI with DES to proximal LAD 2010), VF cardiac arrest s/p ICD implantation (2021 s/p occluded RCA), Afib (on Eliquis ), HFpEF (Echo 12/2023 with EF 60-65%), PAD (s/p aortofemoral bypass grafting), COPD, metastatic non-small cell lung CA (RUL SCC with mets to bone/spine, diagnosed 2025, on chemo/XRT), tobacco abuse, T2DM, hypothyroidism, UGIB, GERD, gout. Recent admission 11/17-11/23 for weakness, general functional decline. Recent presentation to ED 11/29 for R lower leg pain, thought to be gout-related pain and discharged home.  Patient received first chemotherapy infusion at [facility] 12/2AM; during treatment he was noted to be tachycardic, weak and incontinent of stool with minimal UOP despite fluid 1.5L fluid bolus. Sacral wound was also noted. Patient was transferred to [facility] ED for further evaluation. On ED arrival, patient was afebrile with HR 110, BP 109/73, RR 20, SpO2 96% on 2LNC. Labs were notable for WBC 5.0, Hgb 7.4 (baseline 8.5-10), Plt 122. Na 137, K 6.0, CO2 12, BUN 69/Cr 1.95 (baseline 1.1-1.2). AST/ALT 1630/827, Alk Phos 161, Tbili 2.0. LA 8.9 > 9.0. Trop 95 > 92 (suspect demand-related), BNP 11,858. UA grossly unremarkable. C. Diff negative, COVID/Flu/RSV negative. BCx pending. CXR negative for acute findings. CT Chest/A/P demonstrated metastatic lung CA with progression of metastatic disease, increased size of primary lesion and increased size of lytic bone lesions; new moderate R pleural effusion.   While in ED, patient became hypotensive to SBP 80s-90s requiring Levophed  initiation. 1U PRBCs was ordered for symptomatic  anemia. Decision to transfer patient to [facility] for admission. PCCM consulted for ICU admission.  Pertinent Medical History:   Past Medical History:  Diagnosis Date   Acute upper GI bleed    Anemia    Anticoagulated on apixaban     Cardiac arrest with ventricular fibrillation (HCC) 2021   CHF (congestive heart failure) (HCC)    Chronic a-fib (HCC)    Coronary artery disease    a. s/p NSTEMI in 2010 with DES to proximal LAD with D1 jailed and angioplasty alone to ostium   Dyspnea    GERD (gastroesophageal reflux disease)    Hyperlipidemia    Hypertension    Mediastinal lymphadenopathy    NSTEMI (non-ST elevated myocardial infarction) (HCC) 2010   Peripheral arterial disease    S/P coronary artery stent placement    S/P ICD (internal cardiac defibrillator) procedure    Squamous cell carcinoma of lung, stage IV, right (HCC)    Diagnosed 2025, metastatic to bone/spine   Tobacco abuse    Type 2 diabetes mellitus (HCC)    Significant Hospital Events: Including procedures, antibiotic start and stop dates in addition to other pertinent events   12/2 - Presented to [facility] ED after weakness, decreased UOP, multiple episodes of diarrhea while receiving first chemotherapy infusion. Labs with anemia, AKI, ALI. CT Chest/A/P with concern for worsening primary lesion/progression of metastatic disease. Hypotensive in ED requiring Levo initiation/1U PRBCs. Transferred to [facility] for admission. PCCM consulted. 12/3 adv HF consulted for acute HF and undiffentiated shock. IPAL discussion. No Code and no intubation. Placed on Dobutamine and NE. LE US  were neg for DVT 12/4 agitated/ removing oxygen . Wanted to go home. PLTs down  to 29K heparin  stopped.   Interim History / Subjective:  No distress Objective:  Blood pressure (!) 90/57, pulse (!) 118, temperature (!) 97.2 F (36.2 C), resp. rate (!) 23, height 5' 7.99 (1.727 m), weight 62.8 kg, SpO2 97%.        Intake/Output Summary (Last 24 hours) at 01/30/2024  0925 Last data filed at 01/30/2024 9341 Gross per 24 hour  Intake 2296.84 ml  Output 500 ml  Net 1796.84 ml   Filed Weights   01/28/24 2345 01/29/24 0411 01/30/24 0500  Weight: 63.8 kg 63.8 kg 62.8 kg   Physical Examination:  General this 78 year old male patient lying in bed no acute distress currently on 4 L nasal cannula HEENT mucous membranes moist temporal wasting Neuro awake moves all extremities oriented x 3 generalized weakness Pulmonary diminished bilateral no accessory use currently Cardiac irregular irregular atrial fibrillation with RVR on telemetry Abdomen soft nontender Extremities warm dry GU concentrated yellow urine      Resolved Hospital Problem List:  Hyperkalemia Hypocalcemia AGMA Lactic acidosis Assessment & Plan:   Undifferentiated shock  w/ MODS 2/2 TLS after Chemo, and resultant vasoplegia complicated by cardiogenic shock from stress CM and AF w/ RVR -seen by ADV HF who recommend comfort care Plan Not candidate for mechanical support Cont Dobutamine, will decrease to 2.5 mics per kilogram per minute, suspect the atrial fibrillation with RVR likely making things worse Cont NE to maintain MAP >65 Keep euvolemic  Repeat lactic acid and co-ox in am  DNR/DNI  H/o CAD with NSTEMI (PCI with DES to proximal LAD 2010), VF cardiac arrest s/p ICD implantation (2021 s/p occluded RCA); HFpEF (Echo 12/2023 with EF 60-65%), Afib (on Eliquis ), HTNHLD PAD (s/p aortofemoral bypass grafting) Plan Cardiac monitoring Holding Eliquis , stopped heparin  d/t thrombocytopenia Holding antihypertensives and diuretics   AKI, -stable. Scr a little better.  LDH improving  Plan MAP goal > 65 Daily assessment for diuretics Renal dose meds  Strict I&O Am chem   Acute liver injury -  Query hypoperfusion versus related to paclitaxel - CT without focal abnormalities, Plan Am CMP  Acute metabolic encephalopathy Plan Supportive care  Metastatic non-small cell lung  CA; RUL SCC with mets to bone/spine, diagnosed 2025, tx with palliative chemotherapy and palliative radiation.  Cancer-related pain Plan Supportive care  Panyctopenia s/p chemo PLTs falling from 122-->29, hgb 8.9, wbc 1.9 Plan Trend cbc Ck diff in am   TLS: indices improving S/p Rasburicase Plan Cont decadron  (complete 3d) Trend LDH and uric acid    COPD Tobacco abuse Moderate R pleural effusion, new S/p initial chemotherapy session 12/2. plan  Bronchodilators PRN Will consider draining his effusion Follow CXR  T2DM Plan SSI CBGs Q4H Goal CBG 140-180  Hypothyroidism Plan Continue home levothyroxine   GERD Plan Continue PPI  Gout Plan Hold colchicine  for now  Sacral wound, POA WOC consulted Plan See recs in chart   GOC DNR/DNI.  Although he seems to hit a plateau, I am concerned overall he is unlikely to completely recover.  I think we are at a point where we need to consider hospice approach.  Family and the patient are both open to palliative consult.  I have suggested that they consider residential hospice as well Plan Will ask palliative to see   I personally  spent 32 minutes  on this patient which included: review of medical records, nursing notes, progress notes, evaluation, interpretation of lab data and diagnostic studies, taking independent history, performing exam, documenting plan, ordering diagnostics  and interventions for the following critical care issues: Circulatory shock, Acute toxic and or metabolic encephalopathy, Severe metabolic derangements with the following interventions which included: titration of hemodynamic drips to desired MAP, titration of inotropic support, prevention of further deterioration , End of life discussion

## 2024-01-30 NOTE — Consult Note (Addendum)
 Consultation Note Date: 01/30/2024   Patient Name: Peter Lambert  DOB: 05/28/1945  MRN: [id number]  Age / Sex: 78 y.o., male  PCP: Marvine Rush, MD Referring Physician: Sharie Bourbon, MD  Reason for Consultation: Establishing goals of care  HPI/Patient Profile: 78 y.o. male  with past medical history of HTN, HLD, CAD with NSTEMI (PCI with DES to proximal LAD 2010), VF cardiac arrest s/p ICD implantation (2021 s/p occluded RCA), Afib (on Eliquis ), HFpEF (Echo 12/2023 with EF 60-65%), PAD (s/p aortofemoral bypass grafting), COPD, metastatic non-small cell lung CA (RUL SCC with mets to bone/spine, diagnosed 2025, on chemo/XRT), tobacco abuse, T2DM, hypothyroidism, UGIB, GERD, and gout  admitted on 01/28/2024 with after receiving first chemo at [facility].  During treatment he was noted to be tachycardic, weak and incontinent of stool with minimal UOP despite fluid 1.5L fluid bolus. CT Chest/A/P demonstrated metastatic lung CA with progression of metastatic disease, increased size of primary lesion and increased size of lytic bone lesions; new moderate R pleural effusion. While in ED, patient became hypotensive to SBP 80s-90s requiring Levophed  initiation. Diagnosed with undifferentiated shock w/ MODS secondary to TLS after chemo complicated by cardiogenic shock and a fib RVR. Started on dobutamine. PMT consulted to discuss GOC and hospice.   Clinical Assessment and Goals of Care: I have reviewed medical records including EPIC notes, labs and imaging, received report from RN, assessed the patient and then spoke with patient's 3 sons  to discuss diagnosis prognosis, GOC, EOL wishes, disposition and options.  I introduced Palliative Medicine as specialized medical care for people living with serious illness. It focuses on providing relief from the symptoms and stress of a serious illness. The goal is to improve quality of life for both the patient  and the family.   We discussed patient's current illness and what it means in the larger context of patient's on-going co-morbidities.  Natural disease trajectory and expectations at EOL were discussed. Sons review their conversations with RN and critical care team. They are aware patient is at end of life. They all agree they want to focus on patient's comfort.  I explained comfort care as care where the patient would no longer receive aggressive medical interventions such as continuous vital signs, lab work, radiology testing, or medications not focused on comfort, peace, and dignity.  All care would focus on how the patient is looking and feeling. This would include management of any symptoms that may cause discomfort, pain, shortness of breath/air hunger, increased work of breathing, cough, nausea, agitation/restlessness, anxiety, and/or secretions etc. Symptoms would be managed with medications and other non-pharmacological interventions such as spiritual support if requested, repositioning, music therapy, or therapeutic listening.   Family agrees to comfort approach to care. They understand this includes cessation of vasopressors.   We also reviewed his ICD - discuss deactivation as this would not be comfortable at end of life - they agree to this as well.   We discuss disposition. They are clear that home with hospice is not an option. We review hospital death vs hospice facility. They are unsure what patient would want. We review a plan to move out of ICU today and transition to comfort measures only then see how patient looks tomorrow. If he is stable for transfer, will consider hospice facility then.   Questions and concerns were addressed. The family was encouraged to call with questions or concerns.    Primary Decision Maker NEXT OF KIN - 3 sons  SUMMARY OF RECOMMENDATIONS   - transition to comfort measures only, dc vasopressors - ICD rep coming to deactivate - transfer to 6N -  consider transfer to hospice facility tomorrow - initiate comfort measures: prn dilaudid, prn oxycodone , prn ativan, prn robinul   Code Status/Advance Care Planning: DNR      Primary Diagnoses: Present on Admission:  Shock (HCC)   I have reviewed the medical record, interviewed the patient and family, and examined the patient. The following aspects are pertinent.  Past Medical History:  Diagnosis Date   Acute upper GI bleed    Anemia    Anticoagulated on apixaban     Cardiac arrest with ventricular fibrillation (HCC) 2021   CHF (congestive heart failure) (HCC)    Chronic a-fib (HCC)    Coronary artery disease    a. s/p NSTEMI in 2010 with DES to proximal LAD with D1 jailed and angioplasty alone to ostium   Dyspnea    GERD (gastroesophageal reflux disease)    Hyperlipidemia    Hypertension    Mediastinal lymphadenopathy    NSTEMI (non-ST elevated myocardial infarction) (HCC) 2010   Peripheral arterial disease    S/P coronary artery stent placement    S/P ICD (internal cardiac defibrillator) procedure    Squamous cell carcinoma of lung, stage IV, right (HCC)    Diagnosed 2025, metastatic to bone/spine   Tobacco abuse    Type 2 diabetes mellitus (HCC)    Social History   Socioeconomic History   Marital status: Divorced    Spouse name: Not on file   Number of children: Not on file   Years of education: Not on file   Highest education level: Not on file  Occupational History   Not on file  Tobacco Use   Smoking status: Former    Current packs/day: 0.50    Average packs/day: 0.5 packs/day for 52.0 years (26.0 ttl pk-yrs)    Types: Cigarettes   Smokeless tobacco: Never  Vaping Use   Vaping status: Never Used  Substance and Sexual Activity   Alcohol use: No   Drug use: No   Sexual activity: Never  Other Topics Concern   Not on file  Social History Narrative   Not on file   Social Drivers of Health   Financial Resource Strain: Not on file  Food Insecurity:  No Food Insecurity (01/15/2024)   Hunger Vital Sign    Worried About Running Out of Food in the Last Year: Never true    Ran Out of Food in the Last Year: Never true  Transportation Needs: No Transportation Needs (01/15/2024)   PRAPARE - Administrator, Civil Service (Medical): No    Lack of Transportation (Non-Medical): No  Physical Activity: Not on file  Stress: Not on file  Social Connections: Moderately Integrated (01/15/2024)   Social Connection and Isolation Panel    Frequency of Communication with Friends and Family: Three times a week    Frequency of Social Gatherings with Friends and Family: Three times a week    Attends Religious Services: 1 to 4 times per year    Active Member of Clubs or Organizations: Yes    Attends Banker Meetings: 1 to 4 times per year    Marital Status: Divorced   Family History  Problem Relation Age of Onset   Colon cancer Mother        thinks it was colon cancer   Cancer Mother        not sure  location    Prostate cancer Father    Dementia Sister    Colon cancer Brother    Stomach cancer Neg Hx    Scheduled Meds:  sodium chloride    Intravenous Once   Chlorhexidine  Gluconate Cloth  6 each Topical Daily   collagenase   1 Application Topical Daily   dexamethasone   8 mg Oral Daily   feeding supplement  237 mL Oral TID BM   insulin  aspart  0-9 Units Subcutaneous Q4H   levothyroxine   50 mcg Oral QAC breakfast   pantoprazole   40 mg Oral Daily   Continuous Infusions:  DOBUTamine 2.5 mcg/kg/min (01/30/24 1146)   lactated ringers  100 mL/hr at 01/30/24 0830   norepinephrine  (LEVOPHED ) Adult infusion 2 mcg/min (01/30/24 0658)   PRN Meds:.acetaminophen , docusate sodium , ipratropium-albuterol , OLANZapine, mouth rinse, polyethylene glycol, prochlorperazine  No Known Allergies Review of Systems  Unable to perform ROS: Mental status change    Physical Exam Constitutional:      General: He is not in acute distress.     Appearance: He is ill-appearing.     Comments: Minimally interactive, denies pain  Cardiovascular:     Rate and Rhythm: Normal rate.  Pulmonary:     Effort: Pulmonary effort is normal.  Skin:    General: Skin is warm and dry.     Vital Signs: BP (!) 90/57   Pulse (!) 118   Temp (!) 97.2 F (36.2 C)   Resp (!) 23   Ht 5' 7.99 (1.727 m)   Wt 62.8 kg   SpO2 97%   BMI 21.06 kg/m  Pain Scale: Faces POSS *See Group Information*: S-Acceptable,Sleep, easy to arouse Pain Score: 0-No pain   SpO2: SpO2: 97 % O2 Device:SpO2: 97 % O2 Flow Rate: .O2 Flow Rate (L/min): 8 L/min  IO: Intake/output summary:  Intake/Output Summary (Last 24 hours) at 01/30/2024 1442 Last data filed at 01/30/2024 9341 Gross per 24 hour  Intake 2013.33 ml  Output 350 ml  Net 1663.33 ml    LBM: Last BM Date : 01/30/24 Baseline Weight: Weight: 63.8 kg Most recent weight: Weight: 62.8 kg     Palliative Assessment/Data: PPS 30%     *Please note that this is a verbal dictation therefore any spelling or grammatical errors are due to the Dragon Medical One system interpretation.   Time Total: 85 minutes Time spent includes: Detailed review of medical records (labs, imaging, vital signs), medically appropriate exam, discussion with treatment team, counseling and educating patient, family and/or staff, documenting clinical information, medication management and coordination of care.    Tobey Jama Barnacle, DNP, AGNP-C Palliative Medicine Team [phone] Pager: [phone]

## 2024-01-30 NOTE — Progress Notes (Signed)
 Labs reviewed No sig change Plan Cont rx as outlined

## 2024-01-30 NOTE — Plan of Care (Signed)

## 2024-01-30 NOTE — Progress Notes (Signed)
 Pt transferred to 6N without complication. Son [name] Kanouse notified of transfer to new room [phone]. Pt resting comfortably in bed.

## 2024-01-31 ENCOUNTER — Ambulatory Visit

## 2024-02-02 LAB — CULTURE, BLOOD (ROUTINE X 2)
Culture: NO GROWTH
Culture: NO GROWTH
Special Requests: ADEQUATE

## 2024-02-03 ENCOUNTER — Inpatient Hospital Stay

## 2024-02-03 ENCOUNTER — Ambulatory Visit

## 2024-02-10 ENCOUNTER — Inpatient Hospital Stay: Admitting: Dietician

## 2024-02-17 ENCOUNTER — Inpatient Hospital Stay: Admitting: Oncology

## 2024-02-17 ENCOUNTER — Inpatient Hospital Stay

## 2024-02-18 ENCOUNTER — Inpatient Hospital Stay

## 2024-02-25 ENCOUNTER — Other Ambulatory Visit (HOSPITAL_COMMUNITY)

## 2024-02-27 NOTE — Progress Notes (Signed)
  ICD -representative called  on [phone], at 0425 AM but no response   for deactivation as this would not be comfortable at end of life.

## 2024-02-27 NOTE — Progress Notes (Signed)
   02/07/2024 0746  Spiritual Encounters  Type of Visit Initial  Care provided to: Patient  Conversation partners present during encounter Nurse  Reason for visit Patient death  OnCall Visit No   This chaplain responded to a page and visited the patient who was deceased at 53. The family had already left but requested a chaplain to pray over the patient. He was committed to God at 25.

## 2024-02-27 NOTE — Progress Notes (Signed)
 The patient was transferred from ICU,agitated and restless.The patient was medicated for pain ,made comfortable in bed,monitoring in progress.

## 2024-02-27 NOTE — Plan of Care (Signed)
 Palliative:  Chart reviewed 0800 - noted patient had passed away earlier this morning receiving comfort care. Noted concerns ICD was not deactivated - discussed with RN that ICD was deactivated yesterday. She had just heard back from ICD rep who confirmed this.   Tobey Jama Barnacle, DNP, AGNP-C Palliative Medicine Team Team Phone # [phone]  Pager # [phone]

## 2024-02-27 NOTE — Progress Notes (Signed)
 The son Rilley Stash called to check on patient.

## 2024-02-27 NOTE — Progress Notes (Addendum)
 The patient was observed to have no movement,pulse,or breathing at 0450 AM,team member Chasidy was called to confirm death with the reporter.On-call E link was informed of patient 's passing at [phone] and the family was notified at 0501 AM. A magnet was placed on the patient's ICD.The ICD representative was called before the patient's death for deactivation,but there was no response.

## 2024-02-27 NOTE — Discharge Summary (Cosign Needed)
 Death Summary    Patient details  Name: Peter Lambert  [id number]  1945/06/13  2024/02/03  3  Date of death: February 06, 2024 Time of Death 0450   Profile data   adl: fully functional and and independent with all ADLs.  living situation: single family home Code status  code status: full code changed to DNR on 12/3 during IPAL discussion  Reason for admission   Weakness, hypotension, tachycardic  Preliminary Cause of death   Cardiogenic shock due to stress cardiomyopathy   Secondary diagnoses, contributing co-morbidities & complicating factors   Cardiogenic shock  Lactic acidosis  Tumor lysis syndrome Vasoplegia stress cardiomyopathy  AF w/ RVR H/o CAD with NSTEMI  H/o PCI with DES to proximal LAD 02/21/2009 VF cardiac arrest s/p ICD implantation  HFpEF Afib  HTN HLD PAD AKI Acute liver injury  Acute metabolic encephalopathy Metastatic non-small cell lung CA; RUL SCC with mets to bone/spine,  Cancer-related pain Panyctopenia s/p chemo COPD Tobacco abuse Moderate R pleural effusion, new T2DM Hypothyroidism GERD Gout Sacral wound, POA DNR status Comfort care status   Condition on arrival (check all that apply)   Peter Lambert is a 79 y.o. male admitted from single family home  critically ill  critically ill with following findings: .Circulatory shock, Acute toxic and or metabolic encephalopathy, Acute renal failure, Severe metabolic derangements    Hospital course    Patient received first chemotherapy infusion at [facility] 12/2[facility]; during treatment he was noted to be tachycardic, weak and incontinent of stool with minimal UOP despite fluid 1.5L fluid bolus. Sacral wound was also noted. Patient was transferred to [facility] ED for further evaluation. On ED arrival, patient was afebrile with HR 110, BP 109/73, RR 20, SpO2 96% on 2LNC. Labs were notable for WBC 5.0, Hgb 7.4 (baseline 8.5-10), Plt 122. Na 137, K 6.0, CO2 12, BUN 69/Cr 1.95 (baseline 1.1-1.2). AST/ALT  1630/827, Alk Phos 161, Tbili 2.0. LA 8.9 > 9.0. Trop 95 > 92 (suspect demand-related), BNP 11,858. UA grossly unremarkable. C. Diff negative, COVID/Flu/RSV negative. BCx pending. CXR negative for acute findings. CT Chest/A/P demonstrated metastatic lung CA with progression of metastatic disease, increased size of primary lesion and increased size of lytic bone lesions; new moderate R pleural effusion.    While in ED, patient became hypotensive to SBP 80s-90s requiring Levophed  initiation. 1U PRBCs was ordered for symptomatic anemia. Decision to transfer patient to [facility] for admission. PCCM consulted for ICU admission.  Admitted to ICU at [facility] after fluid resuscitation and initiating Norepinephrine  gtt. Received 1 unit of blood. On 12/3 Advanced heart failure was consulted for undifferentiated shock. The working diagnosis was Undifferentiated shock  w/ MODS 2/2 TLS after Chemo, and resultant vasoplegia complicated by cardiogenic shock from stress CM and AF w/ RVR. He was deemed Not a candidate for mechanical circulatory support. Adv Heart failure recommended palliative care. Was started on dobutamine  for low EF and inotropic support in addition to Norepinephrine  to maintain his MAP. In regards to his tumor lysis syndrome he was administered Rasburicase  and 3 days of decadrone with serial LDH and uric acids readings improving and he had mild improvement in renal function.  On 12/4 his course was complicated by some degree of delirium. Also platelets continued to drop so his heparin  was stopped. He had made marginal improvements but there was on-going concern about his underlying health and the overall trajectory of his illness given his continued decline even prior to admission. We spoke to family as  well as the patient (delirium seemed resolved  during the day time hours) and we recommended regardless of results of current hospitalization consideration of hospice would be appropriate. Palliative care was  subsequently consulted to provide further patient and family support.   Palliative care consultation occurred later that day. Discussion about comfort care and the patient family all agreed that they would support transition to comfort care and discontinuation of vasoactive and inotropic support. His ICD was deactivated. IV infusions stopped. He was transferred to medical ward for comfort care later that evening.   The patient subsequently passed away at 0450 on 02-27-2024       Jeralyn FORBES Banner, NP

## 2024-02-27 NOTE — Progress Notes (Signed)
eLink Physician-Brief Progress Note Patient Name: Peter Lambert DOB: [birth year] MRN: [id number]   Date of Service  02/10/2024  HPI/Events of Note  Transition to comfort care yesterday in the setting of stress-induced cardiomyopathy and multiorgan dysfunction  eICU Interventions  Time of death 0450     Intervention Category Minor Interventions: Clinical assessment - ordering diagnostic tests  Zelina Jimerson 02/04/2024, 5:05 AM

## 2024-02-27 NOTE — Progress Notes (Signed)
 Called [employer]. Page made to representative for deactivation waiting for call back.

## 2024-02-27 DEATH — deceased

## 2024-03-05 ENCOUNTER — Ambulatory Visit: Admitting: Radiation Oncology

## 2024-03-10 ENCOUNTER — Inpatient Hospital Stay

## 2024-03-10 ENCOUNTER — Inpatient Hospital Stay: Admitting: Oncology

## 2024-03-12 ENCOUNTER — Inpatient Hospital Stay

## 2024-08-31 ENCOUNTER — Encounter

## 2024-11-30 ENCOUNTER — Encounter

## 2025-03-01 ENCOUNTER — Encounter

## 2025-05-31 ENCOUNTER — Encounter
